# Patient Record
Sex: Male | Born: 1940
Health system: Southern US, Community
[De-identification: ages and names within clinical notes are randomized; demographics above are authoritative.]

## PROBLEM LIST (undated history)

## (undated) DIAGNOSIS — R040 Epistaxis: Secondary | ICD-10-CM

## (undated) DIAGNOSIS — K068 Other specified disorders of gingiva and edentulous alveolar ridge: Secondary | ICD-10-CM

## (undated) DIAGNOSIS — Z973 Presence of spectacles and contact lenses: Secondary | ICD-10-CM

## (undated) DIAGNOSIS — R202 Paresthesia of skin: Secondary | ICD-10-CM

## (undated) DIAGNOSIS — C679 Malignant neoplasm of bladder, unspecified: Secondary | ICD-10-CM

## (undated) DIAGNOSIS — R200 Anesthesia of skin: Secondary | ICD-10-CM

## (undated) DIAGNOSIS — M545 Low back pain: Secondary | ICD-10-CM

## (undated) DIAGNOSIS — M7701 Medial epicondylitis, right elbow: Secondary | ICD-10-CM

## (undated) DIAGNOSIS — L600 Ingrowing nail: Secondary | ICD-10-CM

## (undated) DIAGNOSIS — B351 Tinea unguium: Secondary | ICD-10-CM

## (undated) DIAGNOSIS — H9319 Tinnitus, unspecified ear: Secondary | ICD-10-CM

## (undated) DIAGNOSIS — E785 Hyperlipidemia, unspecified: Secondary | ICD-10-CM

## (undated) HISTORY — PX: KNEE ARTHROSCOPY W/ MENISCAL REPAIR: SHX1877

## (undated) HISTORY — PX: FINGER AMPUTATION: SHX636

## (undated) HISTORY — DX: Epistaxis: R04.0

## (undated) HISTORY — DX: Other specified disorders of gingiva and edentulous alveolar ridge: K06.8

## (undated) HISTORY — DX: Medial epicondylitis, right elbow: M77.01

## (undated) HISTORY — DX: Low back pain: M54.5

## (undated) HISTORY — DX: Ingrowing nail: L60.0

## (undated) HISTORY — PX: SKIN GRAFT: SHX250

## (undated) HISTORY — DX: Tinnitus, unspecified ear: H93.19

---
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
## 1961-12-31 HISTORY — PX: SMALL INTESTINE SURGERY: SHX150

## 1996-12-31 HISTORY — PX: OTHER SURGICAL HISTORY: SHX169

## 1999-08-04 ENCOUNTER — Other Ambulatory Visit: Admission: RE | Admit: 1999-08-04 | Discharge: 1999-08-04 | Payer: Self-pay

## 2002-06-18 ENCOUNTER — Ambulatory Visit (HOSPITAL_COMMUNITY): Admission: RE | Admit: 2002-06-18 | Discharge: 2002-06-18 | Payer: Self-pay | Admitting: Family Medicine

## 2002-07-23 ENCOUNTER — Ambulatory Visit (HOSPITAL_COMMUNITY): Admission: RE | Admit: 2002-07-23 | Discharge: 2002-07-23 | Payer: Self-pay | Admitting: Family Medicine

## 2002-07-23 ENCOUNTER — Encounter: Payer: Self-pay | Admitting: Family Medicine

## 2003-04-28 ENCOUNTER — Ambulatory Visit (HOSPITAL_COMMUNITY): Admission: RE | Admit: 2003-04-28 | Discharge: 2003-04-28 | Payer: Self-pay | Admitting: Family Medicine

## 2003-04-28 ENCOUNTER — Encounter: Payer: Self-pay | Admitting: Family Medicine

## 2003-11-16 ENCOUNTER — Encounter: Admission: RE | Admit: 2003-11-16 | Discharge: 2003-11-16 | Payer: Self-pay | Admitting: Family Medicine

## 2003-11-30 ENCOUNTER — Encounter: Admission: RE | Admit: 2003-11-30 | Discharge: 2003-11-30 | Payer: Self-pay | Admitting: Family Medicine

## 2004-02-09 ENCOUNTER — Encounter: Admission: RE | Admit: 2004-02-09 | Discharge: 2004-02-09 | Payer: Self-pay | Admitting: Family Medicine

## 2004-02-21 ENCOUNTER — Encounter: Admission: RE | Admit: 2004-02-21 | Discharge: 2004-02-21 | Payer: Self-pay | Admitting: Family Medicine

## 2006-12-31 HISTORY — PX: KNEE ARTHROSCOPY W/ MENISCAL REPAIR: SHX1877

## 2007-02-27 DIAGNOSIS — M48061 Spinal stenosis, lumbar region without neurogenic claudication: Secondary | ICD-10-CM | POA: Insufficient documentation

## 2007-02-27 DIAGNOSIS — N400 Enlarged prostate without lower urinary tract symptoms: Secondary | ICD-10-CM | POA: Insufficient documentation

## 2007-04-27 ENCOUNTER — Encounter: Payer: Self-pay | Admitting: Family Medicine

## 2007-04-29 ENCOUNTER — Ambulatory Visit: Payer: Self-pay | Admitting: Family Medicine

## 2007-04-29 ENCOUNTER — Ambulatory Visit (HOSPITAL_COMMUNITY): Admission: RE | Admit: 2007-04-29 | Discharge: 2007-04-29 | Payer: Self-pay | Admitting: Family Medicine

## 2007-04-29 DIAGNOSIS — E785 Hyperlipidemia, unspecified: Secondary | ICD-10-CM | POA: Insufficient documentation

## 2007-04-30 LAB — CONVERTED CEMR LAB
BUN: 13 mg/dL (ref 6–23)
CO2: 26 meq/L (ref 19–32)
Calcium: 9 mg/dL (ref 8.4–10.5)
Chloride: 103 meq/L (ref 96–112)
Cholesterol: 180 mg/dL (ref 0–200)
Creatinine, Ser: 0.74 mg/dL (ref 0.40–1.50)
HDL: 50 mg/dL (ref >39)
Total CHOL/HDL Ratio: 3.6

## 2007-08-27 ENCOUNTER — Encounter: Admission: RE | Admit: 2007-08-27 | Discharge: 2007-08-27 | Payer: Self-pay | Admitting: Orthopedic Surgery

## 2007-09-01 HISTORY — PX: LUMBAR DISC SURGERY: SHX700

## 2007-09-09 ENCOUNTER — Encounter: Payer: Self-pay | Admitting: Family Medicine

## 2007-09-26 ENCOUNTER — Inpatient Hospital Stay (HOSPITAL_COMMUNITY): Admission: RE | Admit: 2007-09-26 | Discharge: 2007-09-29 | Payer: Self-pay | Admitting: Orthopedic Surgery

## 2010-05-16 ENCOUNTER — Ambulatory Visit: Payer: Self-pay | Admitting: Family Medicine

## 2010-05-16 DIAGNOSIS — M7750 Other enthesopathy of unspecified foot: Secondary | ICD-10-CM | POA: Insufficient documentation

## 2010-05-16 DIAGNOSIS — M25569 Pain in unspecified knee: Secondary | ICD-10-CM | POA: Insufficient documentation

## 2010-05-17 ENCOUNTER — Telehealth: Payer: Self-pay | Admitting: *Deleted

## 2010-07-17 ENCOUNTER — Ambulatory Visit: Payer: Self-pay

## 2010-07-17 ENCOUNTER — Encounter: Payer: Self-pay | Admitting: Family Medicine

## 2010-08-01 ENCOUNTER — Encounter
Admission: RE | Admit: 2010-08-01 | Discharge: 2010-08-17 | Payer: Self-pay | Source: Home / Self Care | Admitting: Family Medicine

## 2010-08-21 ENCOUNTER — Ambulatory Visit: Payer: Self-pay | Admitting: Family Medicine

## 2010-08-25 ENCOUNTER — Encounter: Admission: RE | Admit: 2010-08-25 | Discharge: 2010-08-25 | Payer: Self-pay | Admitting: Family Medicine

## 2010-08-25 ENCOUNTER — Encounter (INDEPENDENT_AMBULATORY_CARE_PROVIDER_SITE_OTHER): Payer: Self-pay | Admitting: *Deleted

## 2010-08-25 DIAGNOSIS — M23329 Other meniscus derangements, posterior horn of medial meniscus, unspecified knee: Secondary | ICD-10-CM | POA: Insufficient documentation

## 2010-08-29 ENCOUNTER — Encounter (INDEPENDENT_AMBULATORY_CARE_PROVIDER_SITE_OTHER): Payer: Self-pay | Admitting: *Deleted

## 2010-08-31 ENCOUNTER — Encounter: Payer: Self-pay | Admitting: Family Medicine

## 2010-11-29 ENCOUNTER — Ambulatory Visit: Payer: Self-pay | Admitting: Family Medicine

## 2010-11-29 DIAGNOSIS — J029 Acute pharyngitis, unspecified: Secondary | ICD-10-CM | POA: Insufficient documentation
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
## 2011-02-01 NOTE — Progress Notes (Signed)
Summary: re: immunizations/ts  Phone Note Call from Patient Call back at [phone]   Summary of Call: Pt saw Dr. Sheffield Slider yesterday and says he was to get a tetnus shot and whooping cough shot, but did not should he come back in for them and also will his insurance pay for them? Initial call taken by: Clydell Hakim,  May 17, 2010 9:31 AM  Follow-up for Phone Call        called pt and lmvm to call back. pt has to check with his insurance, if these immunizations are covered. last Pneumovax 04-29-07, Td 1999. does not need Pneumovax, but Td Follow-up by: Arlyss Repress CMA,,  May 18, 2010 12:16 PM  Additional Follow-up for Phone Call Additional follow up Details #1::        informed pt of above. he agreed. Additional Follow-up by: Arlyss Repress CMA,,  May 18, 2010 5:18 PM

## 2011-02-01 NOTE — Letter (Signed)
Summary: [facility] PT Referral Form  [facility] PT Referral Form   Imported By: Marily Memos 07/18/2010 09:04:50  _____________________________________________________________________  External Attachment:    Type:   Image     Comment:   External Document

## 2011-02-01 NOTE — Consult Note (Signed)
Summary: REG. PHYS. ORTHO  REG. PHYS. ORTHO   Imported By: De Nurse 09/12/2010 09:27:13  _____________________________________________________________________  External Attachment:    Type:   Image     Comment:   External Document  Appended Document: Arthroscopy planned arthroscopy planned for left knee medial meniscal tear.    Clinical Lists Changes  Problems: Removed problem of KNEE SPRAIN, LEFT, MEDIAL COLLATERAL LIGAMENT (ICD-844.1)

## 2011-02-01 NOTE — Miscellaneous (Signed)
Summary: NEW ORTH APPT DUE TO [employer]  DR AVIOLI THURS SEPT 1ST AT 8:45AM [address] [place], [place] [id number]  BRING MRI DISC AT THE TIME OF YOUR APPT
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
## 2011-02-01 NOTE — Miscellaneous (Signed)
Summary: ORTHO APPT  APPT WITH DR GRAVES TUES AUG 30TH AT 11:30AM [facility] [phone] PT INFORMED
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
## 2011-02-01 NOTE — Assessment & Plan Note (Signed)
Summary: f/u,mc   Vital Signs:  Patient profile:   70 year old male BP sitting:   133 / 81  Vitals Entered By: Lillia Pauls CMA (August 21, 2010 2:19 PM)  Primary Care Provider:  Zachery Dauer MD   History of Present Illness: f/u Left knee pain has been to PT 6 times, had iontophoresis 4 times as well as [place]. HAs not improved at all. Now having pain walking as well as when he tried to run--not running. Very unhappy he is not running and not getting better. No new symptoms --just having them now with walking and biking  as well.  He is maybe having less night time pain. Not awakening him from sleep any more. No new injruy. Pain 3-4/10, medial left knee--aching.No knee swelling, locking or giving way  Current Medications (verified): 1)  Cvs Aspirin 325 Mg  Tabs (Aspirin) .... Take One Tablet Daily 2)  Bee Pollen 1000 Mg Chew (Bee Pollen) .... Take One Tablet Daily 3)  Pycnogenol 30 Mg Caps (Nutritional Supplements) .... Take One Tablet Daily 4)  Cod Liver Oil  Caps (Cod Liver Oil) .... Take One Tablet Daily 5)  Fish Oil 1200 Mg Caps (Omega-3 Fatty Acids) .... Take One Tablet Daily 6)  Ginseng-Siberian 648 Mg Caps (Misc Natural Products) .... Take One Tablet Daily 7)  One-A-Day Extras Antioxidant  Caps (Multiple Vitamins-Minerals) .... Take One Tablet Daily  Allergies: No Known Drug Allergies  Review of Systems       Please see HPI for additional ROS.   Physical Exam  Msk:  Left knee tender along MCL, still slightly lax c/w opposite side but fairly good endpoint. TTP medial superior  border od patella. Neg [place]. Popliteal space feels a little fuller than I remember from last exam but o discrete mass. distally neurovascularly intact   Impression & Recommendations:  Problem # 1:  KNEE SPRAIN, LEFT, MEDIAL COLLATERAL LIGAMENT (ICD-844.1)  Orders: MRI without Contrast (MRI w/o Contrast) has been  very faithful with rehab and has had litle or no improvement--in fact worsening  pain w walking and biking. Also new area of focval tenderness on patella with a slightly full popliteal space. will get MRI and f/u after that  Complete Medication List: 1)  Cvs Aspirin 325 Mg Tabs (Aspirin) .... Take one tablet daily 2)  Bee Pollen 1000 Mg Chew (Bee pollen) .... Take one tablet daily 3)  Pycnogenol 30 Mg Caps (Nutritional supplements) .... Take one tablet daily 4)  Cod Liver Oil Caps (Cod liver oil) .... Take one tablet daily 5)  Fish Oil 1200 Mg Caps (Omega-3 fatty acids) .... Take one tablet daily 6)  Ginseng-siberian 648 Mg Caps (Misc natural products) .... Take one tablet daily 7)  One-a-day Extras Antioxidant Caps (Multiple vitamins-minerals) .... Take one tablet daily  Patient Instructions: 1)  MRI APPT IS ON FRI AUG 26TH AT 12:30PM AT [facility].

## 2011-03-12 ENCOUNTER — Encounter: Payer: Self-pay | Admitting: Home Health Services

## 2011-04-03 ENCOUNTER — Encounter: Payer: Self-pay | Admitting: Home Health Services

## 2011-04-03 ENCOUNTER — Ambulatory Visit (INDEPENDENT_AMBULATORY_CARE_PROVIDER_SITE_OTHER): Payer: Medicare HMO | Admitting: Home Health Services

## 2011-04-03 VITALS — BP 133/83 | HR 64 | Temp 97.7°F | Ht 68.0 in | Wt 177.0 lb

## 2011-04-03 DIAGNOSIS — Z0000 Encounter for general adult medical examination without abnormal findings: Secondary | ICD-10-CM

## 2011-04-03 DIAGNOSIS — Z23 Encounter for immunization: Secondary | ICD-10-CM

## 2011-04-03 NOTE — Patient Instructions (Signed)
1. Try to eat 4-5 vegetables a day. 2. Work on losing 12 pounds by March 2013 to reach weight of 165 lbs. 3. Consider scheduling a colonoscopy. 4. Be sure to wear sun screen daily on arms, legs, and face.  5. Review Advance Directives with your wife.

## 2011-04-03 NOTE — Progress Notes (Signed)
Patient here for annual wellness visit, patient reports: Risk Factors/Conditions needing evaluation or treatment: Patient does not have any risk factors that need evaluation. Home Safety: Patient lives in home with wife and 2 sons.  Patient reports having smoke detectors and does not have adaptive equipment in bathrooms.  Other Information: Corrective lens: Patient wears corrective lens daily.  Sees doctor as needed. Dentures: Patient does not have dentures.  See dentist every 2 years. Memory: Patient denies any memory problems.  Balance max value patientvalue  Sitting balance 1 1  Arise 2 2  Attempts to arise 2 2  Immediate standing balance 2 2  Standing balance 1 1  Nudge 2 2  Eyes closed 1 1  360 degree turn 1 1  Sitting down 2 2   Gait max value patient value  Initiation of gait 1 1  Step length-left 1 1  Step length-right 1 1  Step height-left 1 1  Step height-right 1 1  Step symmetry 1 1  Step continuity 1 1  Path 2 2  Trunk 2 2  Walking stance 1 1   Balance/Gait Score: 26/26    Annual Wellness Visit Requirements Recorded Today In  Medical, family, social history Past Medical, Family, Social History Section  Current providers Care team  Current medications Medications  Wt, BP, Ht, BMI Vital signs  Hearing assessment (welcome visit) Hearing/Vision  Tobacco, alcohol, illicit drug use History  ADL Nurse Assessment  Depression Screening Nurse Assessment  Cognitive impairment/Mini Mental Status Nurse Assessment/ Flowsheet  Fall Risk Nurse Assessment  Home Safety Progress Note  End of Life Planning (welcome visit) Social Documentation  Medicare preventative services Progress Note  Risk factors/conditions needing evaluation/treatment Progress Note  Personalized health advice Patient Instructions, goals, letter  Diet & Exercise Social Documentation  Emergency Contact Social Documentation  Seat Belts Social Documentation  Sun exposure/protection Social  Documentation    Medicare Prevention Plan: Recommended patient schedule a colonoscopy and consider an aortic ultrasound due to past history of heart disease in family and hx of smoking. Patient requested TD booster vaccine.   Recommended Medicare Prevention Screenings Men over 50 Test For Frequency Date of Last- BOLD if needed  Colorectal Cancer 1-10 yrs recommended  Prostate Cancer Never or yearly 4/08  Aortic Aneurysm Once if 65-75 with hx of smoking recommended  Cholesterol 5 yrs 4/08  Diabetes yearly Non diabetic  HIV yearly declined  Influenza Shot yearly declined  Pneumonia Shot once 4/08  Zostavax Shot once 4/08

## 2011-04-12 NOTE — Progress Notes (Signed)
I have reviewed this visit and discussed with Suzanne Lineberry and agree with her documentation  

## 2011-05-15 NOTE — Op Note (Signed)
NAME:  William [name], William [name] NO.:  [id number]   MEDICAL RECORD NO.:  [id number]          PATIENT TYPE:  INP   LOCATION:  1603                         FACILITY:  [facility]   PHYSICIAN:  William Lynch. Gioffre, M.D.DATE OF BIRTH:  24-Feb-1941   DATE OF PROCEDURE:  09/26/2007  DATE OF DISCHARGE:                               OPERATIVE REPORT   SURGEON:  William Lynch. Darrelyn [name], M.D.   ASSISTANT:  Jene Every, M.D.   PREOPERATIVE DIAGNOSIS:  1. Severe constricted complete block at L3-L4.  2. Severe constricted complete block at L4-L5.  3. Partial foot drop on the right preoperatively.   POSTOPERATIVE DIAGNOSIS:  1. Severe constricted complete block at L3-L4.  2. Severe constricted complete block at L4-L5.  3. Partial foot drop on the right preoperatively.   OPERATION:  1. Central decompressive lumbar laminectomy at L3-L4.  2. Central decompressive lumbar laminectomy at L4-L5.  3. Repair of an approximately a 1 inch dural leak at L4-L5.  4. Bilateral foraminotomies at L3-L4 and at L4-K5.   NOTE:  Under general anesthesia, the patient spinal on the table, first  a Foley catheter was inserted, he was placed on the spinal frame.  A  sterile prep and drape of his back was carried out.  Two needles were  placed in the back for localization purposes and an x-ray was taken.  We  then made an incision over the L3-4 and L4-L5 spaces.  Bleeders were  identified and cauterized.  We stripped the muscle from the underlying  lamina and spinous processes and another x-ray was taken to verify our  position.  We identified the L3 and the L4 spinous processes.  We then  did a central decompressive lumbar laminectomy.  We did utilize the  microscope.  Note, we first went in at the L3-L4 space and, at this  time, we did our central decompression and removed the ligamentum  flavum.  Note, the ligamentum flavum was literally plastered down to the  dura.  He had a complete block at this level.  We  gently were able to  remove that with use of the microscope.   We then proceeded distally where the constriction was even more severe.  He had a complete block at this level. The ligamentum flavum was  thickened and adherent to the dura.  By use of the microscope, we gently  peeled the ligamentum flavum off of the dura and we, at this time,  incurred a dural leak.  It was approximately 1 inch and was a  longitudinal type leak.  We covered the area with a cottonoid and  proceeded to complete our decompression distally and laterally.  The  lateral recess was severely compromised on the right affected side.  We  did a foraminotomy and nicely cleaned out the lateral recess.  When our  decompression was complete, we thoroughly irrigated out the area.  We  gently removed the cottonoid, identified the ends of the dura, and noted  that the rootlets were in good position.  We protected the rootlets and  sutured the dura from side-to-side.  After that was done, we then asked  the anesthesiologist to carry out a Valsalva maneuver and there was no  leakage noted.  Following that, we then put a Duragen patch which is a  dural graft matrix over the dura with some Tisseel followed by just  loosely applying some thrombin soaked Gelfoam.   We then closed the wound in layers in the usual fashion.  We gave him a  second gram of IV Ancef during the procedure, he had 1 gram pre-  procedure.  I left the deep proximal part of the wound open for drainage  purposes.  I did not want to insert a drain for fear of carrying down  any infection on the open dura.  The skin was closed with metal staples.  A sterile Neosporin dressing was applied.  The patient left the  operating room in satisfactory condition.           ______________________________  William [name], M.D.     RAG/MEDQ  D:  09/26/2007  T:  09/27/2007  Job:  [id number]

## 2011-05-15 NOTE — H&P (Signed)
NAME:  William [name], William [name] NO.:  [id number]   MEDICAL RECORD NO.:  [id number]         PATIENT TYPE:  LINP   LOCATION:                               FACILITY:  [facility]   PHYSICIAN:  Georges Lynch. Gioffre, M.D.DATE OF BIRTH:  17-Jun-1941   DATE OF ADMISSION:  09/26/2007  DATE OF DISCHARGE:                              HISTORY & PHYSICAL   CHIEF COMPLAINT:  Lower back and bilateral leg pain.   HISTORY OF PRESENT ILLNESS:  William [name] is a 70 year old gentleman with  some lower back and right leg pain.  He is a very avid runner, does a  lot of running, maybe 50-60 miles a week.  He has been having this onset  of this problem now for about 3-4 months.  He has been worked up with  MRIs and myelogram.  His myelogram shows he has some severe spinal  stenosis at L3-4, L4-5 so the patient and Dr. Darrelyn Hillock reviewed the  findings and the patient would like to proceed with a central  decompressive lumbar laminectomy at L3-4, L4-5 for his complaints.   ALLERGIES:  No known drug allergies.   MEDICATIONS:  Multivitamins, aspirin, ginseng, garlic, omega-3, B  complex.  The patient reports to have stopped all above medicines  preoperatively.   PRESENT MEDICAL HISTORY:  1. The patient reports having blackout spells probably 3-4 years ago      that has been evaluated by a cardiologist, it was related to his      running.  He would get up to his running speed then he would stop      abruptly and he would occasionally have a blackout period for a      short period of time.  The cardiologist felt it was related to the      abrupt stopping and he has adjusted his running activities and he      has not had this problem repeat itself.  2. History of ulcers 30 years previously, no recurrence.   REVIEW OF SYSTEMS:  The patient denies review of systems of any  neurologic, pulmonary, cardiovascular, GI, GU, hematologic problems.   PRIMARY CARE PHYSICIAN:  Wayne A. Sheffield Slider, M.D.   PAST SURGICAL  HISTORY:  1. Abdominal adhesions in 1959.  2. Hemorrhoidectomy in 1996.  3. Bone spurs right heel in 1999.  4. The patient denies any complications with any above mentioned      surgical procedures.   FAMILY HISTORY:  Father is deceased from a history of complications of a  stroke with history of diabetes at the age of 50.  Mother is deceased at  old age with bad arthritis.   SOCIAL HISTORY:  The patient is married.  He is currently retired.  He  does not smoke or use alcohol.  He has 3 grown children and lives with  his wife in a one story house.   PHYSICAL EXAMINATION:  GENERAL:  The patient is a very healthy  appearing, well-developed, conscious and alert and appropriate 66-year-  old gentleman, appears to be in no distress.  VITAL SIGNS:  Height  is 5 foot 7 inches, weight is 174, blood pressure  is 122/78, pulse is 50, the patient reports normal bradycardic resting  rate.  HEENT:  Head is normocephalic.  He has a very large skin graft across  the anterior part of his left forehead.  Extraocular movements intact.  Gross hearing is intact.  Oral buccal mucosa is pink and moist.  NECK:  Good range of motion without any discomfort.  No palpable  lymphadenopathy.  CHEST:  Lung sounds were clear and equal bilaterally, no wheezes, rales  or rhonchi.  HEART:  Regular rate and rhythm, bradycardic, no murmurs, rubs or  gallops.  ABDOMEN:  Soft, nontender, bowel sounds present.  EXTREMITIES:  Upper extremities he had symmetrically shape, good range  of motion of his shoulders, elbows, and wrist.  Lower extremities had  excellent range of motion without any difficulty.  NEURO:  The patient was conscious, alert, and appropriate.  He had a  normal light touch sensation neuro exam of his lower extremities.  Motor  strength was 5/5.  He had good range of motion of his back today without  any radicular complaints.  PERIPHERAL VASCULAR:  Carotid pulses were 2+, radial pulses 2+, dorsalis   pedis pulses, he had no lower extremity pigmentation changes or edema.  BREAST/RECTAL/GU:  Deferred at this time.   IMPRESSION:  1. Severe spinal stenosis at L3-4, L4-5 with intermittent right leg      pain.  2. History of previous blackout spells.  3. History of ulcers.   PLAN:  The patient will undergo all routine labs and tests prior to  having a central decompressive lumbar laminectomy at L3-4, L4-5 by Dr.  Darrelyn Hillock at [facility] on September 26, 2007.  The patient's  primary care physician has indicated that there are no preoperative  contraindications for this upcoming surgical procedure.      Jamelle Rushing, P.A.    ______________________________  Georges Lynch Darrelyn Hillock, M.D.    RWK/MEDQ  D:  09/15/2007  T:  09/15/2007  Job:  [id number]

## 2011-05-18 NOTE — Discharge Summary (Signed)
NAME:  William [name], William [name] NO.:  [id number]   MEDICAL RECORD NO.:  [id number]          PATIENT TYPE:  INP   LOCATION:  1603                         FACILITY:  [facility]   PHYSICIAN:  Georges Lynch. Gioffre, M.D.DATE OF BIRTH:  10/22/1941   DATE OF ADMISSION:  09/26/2007  DATE OF DISCHARGE:  09/29/2007                               DISCHARGE SUMMARY   ADMISSION DIAGNOSES:  1. Severe spinal stenosis L3-4 and L4-5 with intermittent right leg      pain.  2. History of previous blackout spells.  3. History of ulcers.   DISCHARGE DIAGNOSIS:  1. Central decompression at L3-4 and L4-5 with intraoperative dural      leak.  2. History of previous blackouts.  3. History of ulcers.   HISTORY OF PRESENT ILLNESS:  The patient is a 70 year old gentleman with  some lower back pain and right leg pain.  He is an avid runner and runs  50-60 miles a week.  He has had about 3-4 months of lower back pain and  right leg pain.  A CT myelogram shows that he has severe spinal stenosis  at L3-4, L4-5.  The patient has elected to proceed with a central  decompressive lumbar laminectomy at L3-4, L4-5 due to his complaints.   ALLERGIES:  No known drug allergies.   CURRENT MEDICATIONS:  Notable for over-the-counter food supplements and  multivitamins.   SURGICAL PROCEDURE:  On September 26, 2007, the patient was taken to the  OR by Dr. Worthy Rancher assisted by Dr. Jene Every.  Under general  anesthesia the patient underwent decompressive lumbar laminectomy of L3-  4, L4-5.  Intraoperatively there was a small dural leak.  This was  treated with a patch that had a good seal.  The leak was in the L4-5  region.  Estimated blood loss was 300 mL.  The patient was transferred  to the recovery room and then to the orthopedic floor in good condition.   CONSULTS:  The following routine consults were requested:  Physical  therapy and case management.   HOSPITAL COURSE:  On September 26, 2007, the patient  was admitted to was  [facility] under the care of Dr. Worthy Rancher.  The patient was  taken to the OR where it a central lumbar decompressive lumbar  laminectomy was performed at L3-4, L4-5.  Intraoperatively there was a  small dural leak at the L4-5 level.  The patient had this repaired  intraoperatively with a patch.  There was no problems with postpatching  leaking.  The patient was transferred to the recovery room and then to  orthopedic floor in good condition.   The patient then was kept at bedrest for a 24-hour period for his  intraoperative dural leak.  The first postop day the patient was  neurologically intact in lower extremities with motion, sensory, and  motor strength.  He had no signs of postoperative dural leak at that  time.   Postop day #2 the patient was able to get up, and mobilize out of bed,  He did well.  He had  no problems with his wound, and was maintained  neurologically intact in the lower extremities.  Postop day #2 the  patient was evaluated by Dr. Darrelyn Hillock and was doing very well.  He had no  problems with no signs of any resulting dural leak.  He was  neurologically intact in both legs with no pain.  The patient was  discharged home for routine outpatient care.   LABS:  CBC on admission found WBC to be 5.3, hemoglobin 15.1, hematocrit  44.1, platelets 229.  Postoperatively his H&H was 11.7 and 34.0.  Routine chemistries on admission were normal.  Routine urinalysis on  admission was normal.  EKG on admission was marked sinus bradycardia at  39 beats per minute which was normal for this patient.  Chest x-ray  showed no acute cardiopulmonary disease.   DISCHARGE INSTRUCTIONS:  1. Activity--the patient is to slowly increase activity as tolerated      without any bending, stooping, squatting, or lifting.  2. Diet--no restrictions.   MEDICATIONS:  1. Aspirin 325 mg a day.  2. Geritol multivitamins 1/2 teaspoon a day.  3. One-a-day Centrum 1  tablet a day.  4. Red Chinese ginseng 1 tablet twice a day.  5. Regular ginseng complex 1 tablet twice a day.  6. Horse chestnut 1 tablet a day.  7. Ginkgo biloba 1 tablet a day.  8. Ginger root 1 tablet a day.  9. Super energy up B12 and ginger 1 tablet a day.  10.Omega-3 one tablet a day.  11.Garlic 1 tablet a day.  12.__________ 1 tablet a day.  13.Robaxin 500 mg 1 tablet q.6 h. for pain if needed.  14.Percocet 10/650 one tablet q.4-6 h. for pain if needed.  15.Keflex 500 mg one tablet 4x a day until gone.   FOLLOWUP:  The patient is to have a follow up appointment with Dr.  Darrelyn Hillock in his office the following Monday for evaluation.  The patient  was to call [phone] for that appointment.   DISPOSITION:  The patient's condition upon discharge to home is listed  as improved and good.      Jamelle Rushing, P.A.    ______________________________  Georges Lynch Darrelyn Hillock, M.D.    RWK/MEDQ  D:  10/15/2007  T:  10/15/2007  Job:  [id number]   cc:   Windy Fast A. Darrelyn Hillock, M.D.  Fax: [phone]

## 2011-09-05 ENCOUNTER — Telehealth: Payer: Self-pay | Admitting: Family Medicine

## 2011-09-05 ENCOUNTER — Ambulatory Visit (INDEPENDENT_AMBULATORY_CARE_PROVIDER_SITE_OTHER): Payer: Medicare HMO

## 2011-09-05 VITALS — BP 138/80 | HR 60 | Temp 97.6°F | Ht 67.5 in | Wt 182.0 lb

## 2011-09-05 DIAGNOSIS — M76892 Other specified enthesopathies of left lower limb, excluding foot: Secondary | ICD-10-CM

## 2011-09-05 MED ORDER — DICLOFENAC EPOLAMINE 1.3 % TD PTCH: 1.0000 | MEDICATED_PATCH | Freq: Two times a day (BID) | TRANSDERMAL | Status: DC

## 2011-09-05 NOTE — Assessment & Plan Note (Signed)
Patient with extra-articular L knee pain, suspect tendinitis as cause.  Short-course systemic NSAIDs, then topical NSAID, heat.  Rest, then reduction of resistance for leg curls.  To refer for Sports Medicine evaluation if no better with these interventions.

## 2011-09-05 NOTE — Telephone Encounter (Signed)
Called pt and advised to have Rx transferred. Pt agreed. Lorenda Hatchet, Renato Battles

## 2011-09-05 NOTE — Progress Notes (Signed)
  Subjective:    Patient ID: William [name], male    DOB: 08-23-1941, 70 y.o.   MRN: [id number]  HPI Patient here for acute visit, complaint of L posterior knee pain which had onset May 25, 2011 while working out on [employer].  Has had constant pain that is worse with flexion L knee.  No swelling or pop/sudden weakness.  Prior L meniscal surgery Sept 19, 2011 with Dr Dimas Aguas, worked postop with PT and began walking/running again (longtime runner, previously 50-60 miles/week; down to 20-30 miles/week after meniscal injury/surgery).  In May he began to increase the amount of weight in leg curls (from 30lb/leg to 70lb/leg); worsening pain.  Took Advil 400mg  a few times without improvement. Ice did not help either.  Pain at night when lays on L side.    Former smoker, quit 30 years ago.  Used to be occasional beer drinker, none in several years.    Review of Systems No fevers/chills, no trauma, no swelling in legs.  No changes in sensation distal legs/feet.     Objective:   Physical Exam Well appearing, no apparent distress.  HEENT Neck supple.  MSK: Full active and passive ROM both knees; ankles; hips.  No skin lesions or rubor/calor of either knee, no effusions bilat.  No cords or popliteal masses noted bilaterally. Pain to palpate along L posterior-medial tendon, worse with flexion (passive and active).  Dorsalis pedis pulses full and symmetric bilaterally.  No edema noted in ankles bilaterally.        Assessment & Plan:

## 2011-09-05 NOTE — Telephone Encounter (Signed)
Mr. Candee was in earlier and a patch of Flector was to be sent to [facility].  It appears that it has gone, but he is concerned that it may have gone to the wrong [facility] as there are 2 on [address].  He said the correct [facility] is the one at [facility] - phone # [phone].

## 2011-09-05 NOTE — Patient Instructions (Signed)
It was a pleasure to see you today.  The pain behind your left knee is related to tendinitis (muscular insertion of the hamstring muscles behind your knee).   We believe this is related to the increase in resistance that you are using with the knee curls.    I recommend reducing the amount of resistance (say, from 70 lbs back to 30 lbs) after taking a couple of days off.  You may use heating pads ad lib for the pain.   I recommend a short course of ibuprofen (Advil) from 400 to 600mg  by mouth every 6 to 8 hours with food, for the next 5 days.  Also, I am prescribing Flector patch, apply one patch twice daily after you stop the oral ibuprofen.  Gradual increase in the amount of resistance you are using; do not rush to increase the weight for the knee curls.

## 2011-10-03 ENCOUNTER — Ambulatory Visit (INDEPENDENT_AMBULATORY_CARE_PROVIDER_SITE_OTHER): Payer: Medicare HMO | Admitting: Family Medicine

## 2011-10-03 ENCOUNTER — Encounter: Payer: Self-pay | Admitting: Family Medicine

## 2011-10-03 ENCOUNTER — Telehealth: Payer: Self-pay | Admitting: Family Medicine

## 2011-10-03 VITALS — BP 132/81 | HR 63 | Temp 98.7°F | Ht 67.5 in | Wt 184.5 lb

## 2011-10-03 DIAGNOSIS — M23329 Other meniscus derangements, posterior horn of medial meniscus, unspecified knee: Secondary | ICD-10-CM

## 2011-10-03 DIAGNOSIS — Z23 Encounter for immunization: Secondary | ICD-10-CM

## 2011-10-03 NOTE — Progress Notes (Signed)
  Subjective:    Patient ID: William [name], male    DOB: 02/03/1941, 70 y.o.   MRN: [id number]  HPI  Left knee pain. Had arthroscopy in September or October 2011. Had decrease in pain and did well until recently. Has tried to get back to running 15 m a week and is having psoterior left knee pain constantly, worse with activity esp running which is a passion for him. Some stiffness..pain in the top of the medial posterior left calf associated with it. No locking. Does not take anything other than tylenol for pain and does not wants any pain meds.  Review of Systems Pertinent review of systems: negative for fever or unusual weight change.     Objective:   Physical Exam  GEN WD WN NAD. KNEE: left: mild stiffness with extension. Flex and extension full. TTP posterior medial joint line. Popliteal space is benign. Calf is soft. Distally neurovascularly intact.      Assessment & Plan:  Knee pain s/p arthroscopy for medial meniscal tear. I think he would be best served by a return to see Dr Dimas Aguas who did his 'scope. He does not want any pain meds. He wants to keep running some.

## 2011-10-03 NOTE — Telephone Encounter (Signed)
Called Dr. Elta Guadeloupe office and they will fax over a referral sheet for him.Laureen Ochs, Viann Shove

## 2011-10-03 NOTE — Telephone Encounter (Signed)
Dear [facility] Team Please set up his ortho appt w Dr Lawana Chambers should be in [facility]! Denny Levy

## 2011-10-11 LAB — URINALYSIS, ROUTINE W REFLEX MICROSCOPIC
Glucose, UA: NEGATIVE
Nitrite: NEGATIVE
Protein, ur: NEGATIVE

## 2011-10-11 LAB — COMPREHENSIVE METABOLIC PANEL
Albumin: 3.9
BUN: 8
Creatinine, Ser: 0.76
Total Bilirubin: 0.8
Total Protein: 7.1

## 2011-10-11 LAB — DIFFERENTIAL
Basophils Absolute: 0
Lymphocytes Relative: 29
Monocytes Absolute: 0.5
Monocytes Relative: 9
Neutro Abs: 3.2

## 2011-10-11 LAB — TYPE AND SCREEN

## 2011-10-11 LAB — CBC
HCT: 44.1
MCHC: 34.1
MCV: 90
Platelets: 229
RDW: 12.3

## 2011-10-11 LAB — ABO/RH: ABO/RH(D): O POS

## 2011-10-29 ENCOUNTER — Telehealth: Payer: Self-pay | Admitting: *Deleted

## 2011-10-29 NOTE — Telephone Encounter (Signed)
Called patient and instructed him to come in for EKG for surgical clearance tomorrow on the nurse list per Dr Sheffield Slider. He agreed to come in at 9:00.Busick, Rodena Medin

## 2011-10-30 ENCOUNTER — Ambulatory Visit (INDEPENDENT_AMBULATORY_CARE_PROVIDER_SITE_OTHER): Payer: Medicare HMO | Admitting: *Deleted

## 2011-10-30 ENCOUNTER — Ambulatory Visit (HOSPITAL_COMMUNITY)
Admission: RE | Admit: 2011-10-30 | Discharge: 2011-10-30 | Disposition: A | Discharge status: Home / Self Care | Payer: Medicare HMO | Source: Ambulatory Visit | Attending: Family Medicine | Admitting: Family Medicine

## 2011-10-30 ENCOUNTER — Other Ambulatory Visit: Payer: Self-pay

## 2011-10-30 DIAGNOSIS — IMO0001 Reserved for inherently not codable concepts without codable children: Secondary | ICD-10-CM

## 2011-10-30 DIAGNOSIS — Z789 Other specified health status: Secondary | ICD-10-CM

## 2011-10-30 DIAGNOSIS — Z01810 Encounter for preprocedural cardiovascular examination: Secondary | ICD-10-CM | POA: Insufficient documentation

## 2011-10-30 DIAGNOSIS — M23329 Other meniscus derangements, posterior horn of medial meniscus, unspecified knee: Secondary | ICD-10-CM

## 2011-10-30 NOTE — Progress Notes (Signed)
In for EKG for surgical clearance. EKG performed and placed in MD box.

## 2011-10-30 NOTE — Assessment & Plan Note (Signed)
Medical clearance for L knee arthroplasty with debridement and possible medial and lateral menisectomy  EKG was normal so medical clearance OK'd on form to be faxed back.

## 2011-11-01 HISTORY — PX: KNEE ARTHROSCOPY W/ MENISCAL REPAIR: SHX1877

## 2012-04-08 ENCOUNTER — Encounter: Payer: Self-pay | Admitting: Home Health Services

## 2012-04-10 ENCOUNTER — Encounter: Payer: Self-pay | Admitting: Family Medicine

## 2012-04-10 ENCOUNTER — Ambulatory Visit (HOSPITAL_COMMUNITY)
Admission: RE | Admit: 2012-04-10 | Discharge: 2012-04-10 | Disposition: A | Discharge status: Home / Self Care | Payer: Medicare HMO | Source: Ambulatory Visit | Attending: Family Medicine | Admitting: Family Medicine

## 2012-04-10 ENCOUNTER — Ambulatory Visit (INDEPENDENT_AMBULATORY_CARE_PROVIDER_SITE_OTHER): Payer: Medicare HMO | Admitting: Family Medicine

## 2012-04-10 ENCOUNTER — Ambulatory Visit
Admission: RE | Admit: 2012-04-10 | Discharge: 2012-04-10 | Disposition: A | Discharge status: Home / Self Care | Payer: Medicare HMO | Source: Ambulatory Visit | Attending: Family Medicine | Admitting: Family Medicine

## 2012-04-10 VITALS — BP 126/80 | HR 60 | Ht 67.5 in | Wt 183.0 lb

## 2012-04-10 DIAGNOSIS — J449 Chronic obstructive pulmonary disease, unspecified: Secondary | ICD-10-CM | POA: Insufficient documentation

## 2012-04-10 DIAGNOSIS — R0602 Shortness of breath: Secondary | ICD-10-CM

## 2012-04-10 DIAGNOSIS — E785 Hyperlipidemia, unspecified: Secondary | ICD-10-CM

## 2012-04-10 DIAGNOSIS — M25569 Pain in unspecified knee: Secondary | ICD-10-CM

## 2012-04-10 DIAGNOSIS — M542 Cervicalgia: Secondary | ICD-10-CM | POA: Insufficient documentation

## 2012-04-10 LAB — CBC
HCT: 41.6 % (ref 39.0–52.0)
Hemoglobin: 13.4 g/dL (ref 13.0–17.0)
MCH: 30 pg (ref 26.0–34.0)
MCV: 93.3 fL (ref 78.0–100.0)
Platelets: 257 10*3/uL (ref 150–400)
RBC: 4.46 MIL/uL (ref 4.22–5.81)

## 2012-04-10 LAB — COMPREHENSIVE METABOLIC PANEL
BUN: 13 mg/dL (ref 6–23)
CO2: 25 mEq/L (ref 19–32)
Calcium: 9.3 mg/dL (ref 8.4–10.5)
Creat: 0.74 mg/dL (ref 0.50–1.35)
Glucose, Bld: 94 mg/dL (ref 70–99)
Total Bilirubin: 0.6 mg/dL (ref 0.3–1.2)

## 2012-04-10 LAB — LDL CHOLESTEROL, DIRECT: Direct LDL: 137 mg/dL — ABNORMAL HIGH

## 2012-04-10 NOTE — Assessment & Plan Note (Signed)
rule out cervical radiculopathy due to DDD or DJD

## 2012-04-10 NOTE — Progress Notes (Signed)
  Subjective:    Patient ID: William [name], male    DOB: [birth year], 71 y.o.   MRN: [id number]  HPIHe had a second left knee arthroscopic surgery Nov of 2012 and has been exercising regularly in the hope of returning to running. He currently can only run about 4 minutes due to knee discomfort. He's been doing the elliptical at a slow speed for up to an hour or the stationary bicycle for 45 minutes. However, he's concerned because he has dyspnea at the top of a flight of stairs and he doesn't seem to be regaining his conditioning. When using the exercise equipment he had chronically developed numbness in his fingers relieved by shaking. These symptoms don't occur at night. Recently he's had a sore tightness at times in his left neck. He intermittently has a numb vibratory feeling in his left lateral anterior thigh and at other times in the left pectoral area, neither associated with a particular activity or position.   He has nocturia x 1-2 depending on intake and also notes dribbling and urgency when his bladder is full, but no burning.   He did stool hemacults through [facility] about 6 months ago that were negative.    Review of Systems  Constitutional: Positive for fatigue. Negative for fever, chills, diaphoresis, activity change, appetite change and unexpected weight change.  HENT: Positive for neck pain and neck stiffness.   Eyes: Negative for visual disturbance.  Respiratory: Positive for cough and shortness of breath. Negative for chest tightness and wheezing.   Gastrointestinal: Negative for nausea, diarrhea, constipation and blood in stool.  Genitourinary: Positive for urgency. Negative for difficulty urinating.  Musculoskeletal: Positive for back pain and arthralgias.       Typical back pain after certain activity  Neurological: Negative for dizziness, tremors, syncope, numbness and headaches.  Hematological: Negative for adenopathy.  Psychiatric/Behavioral: Negative for dysphoric mood.        Objective:   Physical Exam  Constitutional: He is oriented to person, place, and time. He appears well-developed and well-nourished.  Neck: Normal range of motion.  Cardiovascular: Regular rhythm.   No murmur heard. Pulmonary/Chest: Effort normal and breath sounds normal. No respiratory distress. He has no wheezes. He has no rales.  Abdominal: Soft. Bowel sounds are normal. He exhibits no distension and no mass. There is no tenderness.       right sided vertical old surgical scar  Musculoskeletal: Normal range of motion. He exhibits no edema and no tenderness.       Arthroscopy scars left knee Full range of motion  No effusion Spurlings test neg  Neurological: He is alert and oriented to person, place, and time. He has normal reflexes. No cranial nerve deficit.       No current decrease in skin sensation          Assessment & Plan:

## 2012-04-10 NOTE — Patient Instructions (Addendum)
I will call with your results when all complete. Return if you fail to gradually improve.  Meralgia Paresthetica   Meralgia paresthetica (MP) is a disorder characterized by tingling, numbness, and burning pain in the outer side of the thigh. It occurs in men more than women. MP is generally found in middle-aged or overweight people. Sometimes, the disorder may disappear. CAUSES The disorder is caused by a nerve in the thigh being squeezed (compressed). MP may be associated with tight clothing, pregnancy, diabetes, and being overweight (obese). SYMPTOMS  Tingling, numbness, and burning in the outer thigh.   An area of the skin may be painful and sensitive to the touch.  The symptoms often worsen after walking or standing. TREATMENT  Treatment is based on your symptoms and is mainly supportive. Treatment may include:  Wearing looser clothing.   Losing weight.   Avoiding prolonged standing or walking.   Taking medication.   Surgery if the pain is peristent or severe.  MP usually eases or disappears after treatment. Surgery is not always fully successful. Document Released: 12/07/2002 Document Revised: 12/06/2011 Document Reviewed: 12/17/2005 [facility] Patient Information 2012 [place], [place].

## 2012-04-10 NOTE — Assessment & Plan Note (Signed)
May be due to deconditioning relative to his former high level rule out coronary insufficiency or emphysema due to his remote smoking rule out anemia or organ dysfunction

## 2012-04-11 ENCOUNTER — Encounter: Payer: Self-pay | Admitting: Family Medicine

## 2012-04-11 NOTE — Progress Notes (Signed)
Addended by: Zachery Dauer on: 04/11/2012 11:48 AM   Modules accepted: Orders

## 2012-04-15 ENCOUNTER — Telehealth: Payer: Self-pay | Admitting: *Deleted

## 2012-04-15 NOTE — Telephone Encounter (Signed)
Called and informed patient of ETT at [facility] on 05/09/12 at 11:15. Patient was given blue information sheet at his office visit. I reminded him to arrive 45 minutes early at admitting office.Baby Stairs, Rodena Medin

## 2012-04-22 ENCOUNTER — Encounter: Payer: Self-pay | Admitting: Family Medicine

## 2012-04-22 ENCOUNTER — Ambulatory Visit (INDEPENDENT_AMBULATORY_CARE_PROVIDER_SITE_OTHER): Payer: Medicare HMO | Admitting: Family Medicine

## 2012-04-22 VITALS — BP 125/83 | HR 61 | Ht 67.5 in | Wt 182.0 lb

## 2012-04-22 DIAGNOSIS — M542 Cervicalgia: Secondary | ICD-10-CM

## 2012-04-22 DIAGNOSIS — R0602 Shortness of breath: Secondary | ICD-10-CM

## 2012-04-22 NOTE — Progress Notes (Signed)
  Subjective:    Patient ID: William [name], male    DOB: 03-26-1941, 70 y.o.   MRN: [id number]  HPI He is here for followup of his poor function testing which was done today by Dr. Raymondo Band. He denies wheezing with anything other than extreme running. He's never used an albuterol inhaler.  He denies chest tightness or discomfort with exertion. He does continue to have discomfort in the left side of his neck with rotation and looking down to that side no numbness or weakness in his arms   Review of Systems     Objective:   Physical Exam  Pulmonary/Chest: Effort normal and breath sounds normal.  Musculoskeletal:       Full range of motion of neck. Pain in upper left trapezius with looking down turned to the left          Assessment & Plan:

## 2012-04-22 NOTE — Assessment & Plan Note (Signed)
Cervical spine films if he gets more distinct radicular symptoms

## 2012-04-22 NOTE — Patient Instructions (Addendum)
Your tests indicate emphysema. We will discuss an inhaler after your stress test beginning of May  Cholesterol Cholesterol is a white, waxy, fat-like protein needed by your body in small amounts. The liver makes all the cholesterol you need. It is carried from the liver by the blood through the blood vessels. Deposits (plaque) may build up on blood vessel walls. This makes the arteries narrower and stiffer. Plaque increases the risk for heart attack and stroke. You cannot feel your cholesterol level even if it is very high. The only way to know is by a blood test to check your lipid (fats) levels. Once you know your cholesterol levels, you should keep a record of the test results. Work with your caregiver to to keep your levels in the desired range. WHAT THE RESULTS MEAN:  Total cholesterol is a rough measure of all the cholesterol in your blood.   LDL is the so-called bad cholesterol. This is the type that deposits cholesterol in the walls of the arteries. You want this level to be low.   HDL is the good cholesterol because it cleans the arteries and carries the LDL away. You want this level to be high.   Triglycerides are fat that the body can either burn for energy or store. High levels are closely linked to heart disease.  DESIRED LEVELS:  Total cholesterol below 200.   LDL below 100 for people at risk, below 70 for very high risk.   HDL above 50 is good, above 60 is best.   Triglycerides below 150.  HOW TO LOWER YOUR CHOLESTEROL:  Diet.   Choose fish or white meat chicken and [place], roasted or baked. Limit fatty cuts of red meat, fried foods, and processed meats, such as sausage and lunch meat.   Eat lots of fresh fruits and vegetables. Choose whole grains, beans, pasta, potatoes and cereals.   Use only small amounts of olive, corn or canola oils. Avoid butter, mayonnaise, shortening or palm kernel oils. Avoid foods with trans-fats.   Use skim/nonfat milk and low-fat/nonfat  yogurt and cheeses. Avoid whole milk, cream, ice cream, egg yolks and cheeses. Healthy desserts include angel food cake, ginger snaps, animal crackers, hard candy, popsicles, and low-fat/nonfat frozen yogurt. Avoid pastries, cakes, pies and cookies.   Exercise.   A regular program helps decrease LDL and raises HDL.   Helps with weight control.   Do things that increase your activity level like gardening, walking, or taking the stairs.   Medication.   May be prescribed by your caregiver to help lowering cholesterol and the risk for heart disease.   You may need medicine even if your levels are normal if you have several risk factors.  HOME CARE INSTRUCTIONS   Follow your diet and exercise programs as suggested by your caregiver.   Take medications as directed.   Have blood work done when your caregiver feels it is necessary.  MAKE SURE YOU:   Understand these instructions.   Will watch your condition.   Will get help right away if you are not doing well or get worse.  Document Released: 09/11/2001 Document Revised: 12/06/2011 Document Reviewed: 03/03/2008 [facility] Patient Information 2012 [place], [place].

## 2012-04-22 NOTE — Progress Notes (Signed)
  Subjective:    Patient ID: William [name], male    DOB: 07-18-1941, 71 y.o.   MRN: [id number]  HPI Requested to perform PFTs on patient with distant hx of tobacco use (quit smoking 26 years ago)   Review of Systems     Objective:   Physical Exam See Documentation Flowsheet (discrete results - PFTs) for complete Spirometry results. Patient provided good effort while attempting spirometry.   Lung Age = 89 years         Assessment & Plan:  Spirometry evaluation reveals Moderate obstructive lung disease.  Patient has been experiencing shortness of breath with exercise.  HE does NOT take any inhalation medical threapies currently.   Deferred changes in treatment plan at this time to Dr. Sheffield Slider.  Reviewed results of pulmonary function tests.  Pt verbalized understanding of results.   Total time in face to face counseling 25 minutes.  Patient seen with Tana Conch, PharmD, Pharmacy Resident.

## 2012-04-22 NOTE — Assessment & Plan Note (Signed)
Emphysema from remote smoking may be the only cause. Dr Raymondo Band recommended a trial of Spiriva. ETT test pending to insure no cardiac ischemia.

## 2012-05-02 ENCOUNTER — Ambulatory Visit (HOSPITAL_COMMUNITY): Payer: Medicare HMO

## 2012-05-09 ENCOUNTER — Ambulatory Visit (HOSPITAL_COMMUNITY)
Admission: RE | Admit: 2012-05-09 | Discharge: 2012-05-09 | Disposition: A | Discharge status: Home / Self Care | Payer: Medicare HMO | Source: Ambulatory Visit | Attending: Family Medicine | Admitting: Family Medicine

## 2012-05-09 ENCOUNTER — Ambulatory Visit (INDEPENDENT_AMBULATORY_CARE_PROVIDER_SITE_OTHER): Payer: Medicare HMO | Admitting: Family Medicine

## 2012-05-09 DIAGNOSIS — R0609 Other forms of dyspnea: Secondary | ICD-10-CM | POA: Insufficient documentation

## 2012-05-09 DIAGNOSIS — R0989 Other specified symptoms and signs involving the circulatory and respiratory systems: Secondary | ICD-10-CM | POA: Insufficient documentation

## 2012-05-09 DIAGNOSIS — R0602 Shortness of breath: Secondary | ICD-10-CM

## 2012-05-09 NOTE — Progress Notes (Signed)
The Graded Exercise Test was negative for signs of ischemia. The patient achieved and exercise level of  9.1 mets. Predicted maximal  heart rate  was  150 85% - 90% Maximal  Heart Rate was : 127 Maximal heart rate obtained was: 155.  Pretest Likelihood that this patient has coronary artery disease (CAD), given their symptoms and risk profile was estimated to be  40%. Post test likelihood of this patient having CAD was estimated to be  less than 10%.  TEST SPECIFICS: Maximum heart rate achieved  155 Exercise effort  excellent Symptoms with exercise?  Some mild shortness of breath toward the end of exercise treadmill test but this was not exercise limiting Blood pressure response to exercise?   normal EKG response to exercise: No signs of ischemia  COMMENTS:  Negative exercise treadmill test for signs of ischemic . I suspect his shortness of breath is related to his advancing age, he is rather long layoff from regular running secondary to his knee issues and his moderate obstructive pulmonary disease. I think all these things have piled up to make him a little more short of breath when he runs out then cytostome last year. I discussed this at length with him and try to get him to be comfortable with his current level of fitness as it is quite excellent. He will followup with his PCP.

## 2012-07-24 ENCOUNTER — Telehealth: Payer: Self-pay | Admitting: Family Medicine

## 2012-07-24 NOTE — Telephone Encounter (Signed)
If patient returns call, ask if he is interested in making a follow up appt with Arlys John, this is a free visit paid by his insurance, see Rosalita Chessman with questions.

## 2012-07-28 ENCOUNTER — Ambulatory Visit (INDEPENDENT_AMBULATORY_CARE_PROVIDER_SITE_OTHER): Payer: Medicare HMO | Admitting: Home Health Services

## 2012-07-28 ENCOUNTER — Encounter: Payer: Self-pay | Admitting: Home Health Services

## 2012-07-28 VITALS — BP 116/73 | HR 64 | Temp 98.4°F | Ht 67.5 in | Wt 180.0 lb

## 2012-07-28 DIAGNOSIS — Z1211 Encounter for screening for malignant neoplasm of colon: Secondary | ICD-10-CM

## 2012-07-28 DIAGNOSIS — Z0000 Encounter for general adult medical examination without abnormal findings: Secondary | ICD-10-CM

## 2012-07-28 NOTE — Progress Notes (Signed)
Patient here for annual wellness visit, patient reports: Risk Factors/Conditions needing evaluation or treatment: Pt does not have any new risk factors that need evaluation. Home Safety: Pt lives with wife, 2 sons in 2 story home.  Pt reports having smoke detectors. Other Information: Corrective lens: Pt wears daily corrective lens. Visits eye dr as needed. Dentures: Pt does not have dentures, visits dentist as needed. Memory: Pt reports some memory loss. Patient's Mini Mental Score (recorded in doc. flowsheet): 26  Balance/Gait: Pt does not have any noticeable impairment Balance Abnormal Patient value  Sitting balance    Sit to stand    Attempts to arise    Immediate standing balance    Standing balance    Nudge    Eyes closed- Romberg    Tandem stance    Back lean    Neck Rotation    360 degree turn    Sitting down     Gait Abnormal Patient value  Initiation of gait    Step length-left    Step length-right    Step height-left    Step height-right    Step symmetry    Step continuity    Path deviation    Trunk movement    Walking stance        Annual Wellness Visit Requirements Recorded Today In  Medical, family, social history Past Medical, Family, Social History Section  Current providers Care team  Current medications Medications  Wt, BP, Ht, BMI Vital signs  Tobacco, alcohol, illicit drug use History  ADL Nurse Assessment  Depression Screening Nurse Assessment  Cognitive impairment Nurse Assessment  Mini Mental Status Document Flowsheet  Fall Risk Nurse Assessment  Home Safety Progress Note  End of Life Planning (welcome visit) Social Documentation  Medicare preventative services Progress Note  Risk factors/conditions needing evaluation/treatment Progress Note  Personalized health advice Patient Instructions, goals, letter  Diet & Exercise Social Documentation  Emergency Contact Social Documentation  Seat Belts Social Documentation  Sun  exposure/protection Social Documentation    Medicare Prevention Plan:   Recommended Medicare Prevention Screenings Men over 65 Test For Frequency Date of Last- BOLD if needed  Colorectal Cancer 1-10 yrs Gave pt hemoccult card  Prostate Cancer Never or yearly NI  Aortic Aneurysm Once if 65-75 with hx of smoking To discuss with PCP  Cholesterol 5 yrs 4/13  Diabetes yearly 4/13  HIV yearly declined  Influenza Shot yearly 10/12  Pneumonia Shot once 4/08  Zostavax Shot once 4/08   I have reviewed this visit and discussed with Arlys John and agree with her documentation. Wayne A. Sheffield Slider, MD

## 2012-07-28 NOTE — Patient Instructions (Signed)
1. Continue to exercise at the [facility]. 2. Continue to focus on eating 3-5 fruits and vegetables a day. 3. Please complete stool card and mail back to [place]. 4. If you become concerned about your breathing or knee, please schedule an appointment with Dr. Sheffield Slider.

## 2012-07-30 ENCOUNTER — Encounter: Payer: Self-pay | Admitting: Home Health Services

## 2012-07-31 ENCOUNTER — Other Ambulatory Visit: Payer: Self-pay | Admitting: Family Medicine

## 2012-07-31 DIAGNOSIS — Z136 Encounter for screening for cardiovascular disorders: Secondary | ICD-10-CM | POA: Insufficient documentation

## 2012-08-01 ENCOUNTER — Telehealth: Payer: Self-pay | Admitting: *Deleted

## 2012-08-01 NOTE — Telephone Encounter (Signed)
Called and informed patient of appointment at [facility] on 08/13/12 at 8:30 for abdominal aortic aneurysm duplex for screening.Vyolet Sakuma, Rodena Medin

## 2012-08-07 ENCOUNTER — Encounter: Payer: Self-pay | Admitting: Family Medicine

## 2012-08-07 LAB — HEMOCCULT GUIAC POC 1CARD (OFFICE)
Card #2 Fecal Occult Blod, POC: NEGATIVE
Card #3 Fecal Occult Blood, POC: NEGATIVE

## 2012-08-07 NOTE — Telephone Encounter (Signed)
Pt was scheduled and already seen for this visit, closing encounter.

## 2012-08-07 NOTE — Addendum Note (Signed)
Addended by: [place], Josalin Carneiro on: 08/07/2012 03:19 PM   Modules accepted: Orders

## 2012-08-13 ENCOUNTER — Encounter (INDEPENDENT_AMBULATORY_CARE_PROVIDER_SITE_OTHER): Payer: Medicare HMO

## 2012-08-13 DIAGNOSIS — E785 Hyperlipidemia, unspecified: Secondary | ICD-10-CM

## 2012-08-13 DIAGNOSIS — Z136 Encounter for screening for cardiovascular disorders: Secondary | ICD-10-CM

## 2012-10-21 ENCOUNTER — Ambulatory Visit (INDEPENDENT_AMBULATORY_CARE_PROVIDER_SITE_OTHER): Payer: Medicare HMO | Admitting: *Deleted

## 2012-10-21 DIAGNOSIS — Z23 Encounter for immunization: Secondary | ICD-10-CM

## 2013-06-03 ENCOUNTER — Encounter: Payer: Self-pay | Admitting: Family Medicine

## 2013-06-03 ENCOUNTER — Ambulatory Visit (INDEPENDENT_AMBULATORY_CARE_PROVIDER_SITE_OTHER): Payer: Medicare HMO | Admitting: Family Medicine

## 2013-06-03 VITALS — BP 134/58 | HR 71 | Temp 97.7°F | Ht 67.75 in | Wt 176.0 lb

## 2013-06-03 DIAGNOSIS — IMO0002 Reserved for concepts with insufficient information to code with codable children: Secondary | ICD-10-CM

## 2013-06-03 DIAGNOSIS — M5410 Radiculopathy, site unspecified: Secondary | ICD-10-CM | POA: Insufficient documentation

## 2013-06-03 MED ORDER — GABAPENTIN 100 MG PO CAPS: ORAL_CAPSULE | ORAL | Status: DC

## 2013-06-03 NOTE — Patient Instructions (Signed)
We will call you within 36 hours of your MRI (unless itis on a weekend). I am starting you on gabapentin. It will require a taper UP which is listed below. Take by tablet by mouth Day 1-3:   take one at night  Day 4-6:  Take 2 at night Day 7-10:  take 3 at night Day 11-14:  3 at night, 1 in am Day 15-18:   3 at night, 2 in am Day 19-21: 3 at night, 3 in am Day 22-25: 3 at night, 3 in am, 2 at lunch Day 26 forward : 3 at night, 3 in am, 3 at lunch  After we get you tapered up to this dose, which is 900 mg a day--let me either see you back or talk with you regarding where to go. The ideal dose is usually around 1800-2600 mg and it comes in pills that are higher doses so you take less actual pills. Please call me if you have new or worsening symptoms in the mean time or if you have questions. Great to see you!

## 2013-06-04 ENCOUNTER — Encounter: Payer: Self-pay | Admitting: Family Medicine

## 2013-06-04 NOTE — Assessment & Plan Note (Signed)
Radiculopathy with some symptoms consistent with neurogenic claudication. He said prior episode of bone spurs.Maryclare Labrador get MRI. May be partly related to spinal stenosis. In the interim I'll start him on gabapentin.

## 2013-06-04 NOTE — Progress Notes (Signed)
  Subjective:    Patient ID: William [name], male    DOB: [birth year], 72 y.o.   MRN: [id number]  HPI Right buttock and leg pain. Worsening. Over the last several months. He's been doing a lot of cross training and has actually given up running. He comes in now because he gained the point where he can't walk the dog. That's about a quarter mile walking he says he can do about it for her mom before the pain is 10 out of 10. He also start her right foot drop at about half-mile.  Past medical history significant for prior lumbar surgery where he had some bone spurs removed. Symptoms she's having now are very similar to those. Radicular hot pain down his right posterior lateral thigh down the calf and into the foot.   Review of Systems No fever, no sweats, no chills. No unusual weight change.    Objective:   Physical Exam  Vital signs are reviewed GENERAL: Well-developed male no acute distress BACK: No tenderness to palpation. No defect. Buttock is nontender to palpation in the sciatic notch area EXTREMITY: 5 out of 5 strength flexion and extension at the hip and knee. Dorsiflexion on the right is 4/5 and plantarflexion 5 out of 5. DTRs at the knee or 2+ at the ankle on the right is 1-2+ versus 2+ on the left. Sensation soft touch intact. Straight leg raise negative bilaterally in seated and supine position. HIPS: Internal/external rotation is intact and full and painless.      Assessment & Plan:

## 2013-06-07 ENCOUNTER — Ambulatory Visit
Admission: RE | Admit: 2013-06-07 | Discharge: 2013-06-07 | Disposition: A | Discharge status: Home / Self Care | Payer: Medicare HMO | Source: Ambulatory Visit | Attending: Family Medicine | Admitting: Family Medicine

## 2013-06-07 DIAGNOSIS — M5410 Radiculopathy, site unspecified: Secondary | ICD-10-CM

## 2013-06-09 ENCOUNTER — Other Ambulatory Visit: Payer: Self-pay | Admitting: Family Medicine

## 2013-06-09 DIAGNOSIS — M5410 Radiculopathy, site unspecified: Secondary | ICD-10-CM

## 2013-06-16 ENCOUNTER — Telehealth: Payer: Self-pay | Admitting: Family Medicine

## 2013-06-16 NOTE — Telephone Encounter (Signed)
Patient needs Dr Jennette Kettle to call him regarding making an appointment with orthopedic doctor.  He has been trying to get in touch for a couple days.

## 2013-06-25 ENCOUNTER — Telehealth: Payer: Self-pay | Admitting: Family Medicine

## 2013-06-25 NOTE — Telephone Encounter (Signed)
Patient is calling requesting to speak to Dr. Jennette Kettle about the Neurosurgeon and the medications that have been prescribed for him.

## 2013-06-25 NOTE — Telephone Encounter (Signed)
Patient wanting to let Dr. Jennette Kettle know that the gabapentin is making her extremely drowsy, very anxious feeling and is not helping much.  Pt also concerned why [facility] has not called him for referral.  Spoke with [facility] and they never received faxed referal form and notes.  I will refax to [phone].  Yenny Kosa, Darlyne Russian, CMA

## 2013-06-29 MED ORDER — HYDROCODONE-ACETAMINOPHEN 5-325 MG PO TABS: 1.0000 | ORAL_TABLET | Freq: Four times a day (QID) | ORAL | Status: DC | PRN

## 2013-06-29 NOTE — Telephone Encounter (Signed)
Pt stated it is making him feel very anxious.  Pt also states he doesn't feel it is helping him that much either. Instructed pt to call [facility] as well.  Wilene Pharo, Darlyne Russian, CMA

## 2013-06-29 NOTE — Telephone Encounter (Signed)
Kristen, I am unclear---does he think the gabapentin (neurontin) is making him MORE anxious or doe he just think itis not helping/ If making him more anxious, we can stop it and try somethingg else--if not helping we can either increase it or try something else. Please also tell him to call [facility] directly if he has not heard from them by mid afternoon today---sometimes the squeaky wheel really does get the grease.  THANKS! Denny Levy PS let me know

## 2013-06-29 NOTE — Telephone Encounter (Signed)
Spoke w pt--pain worsening. I will call [facility] and nudge them. Also will call in some pain medicine and increase neurontin to 3 tabs am, 3 pm and 4 qhs.

## 2013-06-29 NOTE — Telephone Encounter (Signed)
Called in patient's vicodin rx per dr Jennette Kettle.  Also gave him the message from Dr Jennette Kettle to increase night time dose of gabapentin to 400 mg.  Left message for new pt coordinator at [facility] to find out why they have not contacted pt yet.

## 2013-07-02 ENCOUNTER — Telehealth: Payer: Self-pay | Admitting: Family Medicine

## 2013-07-02 NOTE — Telephone Encounter (Signed)
Called [facility] again- they said pt had not been scheduled yet, left another message for Thayer Ohm the new pt coordinator to return my call.   Will forward to [employer] she needs to f/u on referral.

## 2013-07-02 NOTE — Telephone Encounter (Signed)
Pt contacted [facility] and was told there is no referral there. Please advise

## 2013-07-15 ENCOUNTER — Telehealth: Payer: Self-pay | Admitting: Family Medicine

## 2013-07-15 NOTE — Telephone Encounter (Signed)
Pt is calling for Dr. Jennette Kettle call him about his recent visit with her and the medication she prescribed to him. JW

## 2013-07-15 NOTE — Telephone Encounter (Signed)
Will fwd to MD.  According to last OV note with Dr. Jennette Kettle, she wanted to discuss gabapentin with patient.  Missey Hasley, Darlyne Russian, CMA

## 2013-07-20 NOTE — Telephone Encounter (Signed)
Pt reports taking gabapentin 1200 mg a day for two weeks.  Pt reports is has decreased his pain almost by half.  Pt wants to know if he should continue on this same dosage.  Patient also states that the [facility] neurosurgeons want to set him up appt for 2 epidural shots and pt wants to speak to Dr. Sheffield Slider.  Will fwd to MD.  Radene Ou, CMA

## 2013-07-20 NOTE — Telephone Encounter (Signed)
Dear [facility] Team Please find outy what the question is---has he seen the surgeon yet? THANKS! Denny Levy

## 2013-07-22 ENCOUNTER — Telehealth: Payer: Self-pay | Admitting: Family Medicine

## 2013-07-22 NOTE — Telephone Encounter (Signed)
Pt is wanting to talk to Dr. Sheffield Slider about his medication on what he needs to do since his other appointment is further away than the amount of medication he has.JW

## 2013-07-22 NOTE — Telephone Encounter (Signed)
Please ask what med?

## 2013-07-23 MED ORDER — GABAPENTIN 600 MG PO TABS: 600.0000 mg | ORAL_TABLET | Freq: Three times a day (TID) | ORAL | Status: DC

## 2013-07-23 NOTE — Telephone Encounter (Signed)
I spoke with him and he would like to continue the gabapentin and things that he could tolerated a larger dose so I sent in 600 mg to take one three times daily or a half tab three times daily when he is doing better.

## 2013-07-23 NOTE — Addendum Note (Signed)
Addended by: Zachery Dauer on: 07/23/2013 05:52 PM   Modules accepted: Orders

## 2013-08-25 ENCOUNTER — Encounter: Payer: Self-pay | Admitting: Family Medicine

## 2013-08-25 ENCOUNTER — Ambulatory Visit (INDEPENDENT_AMBULATORY_CARE_PROVIDER_SITE_OTHER): Payer: Medicare HMO | Admitting: Family Medicine

## 2013-08-25 VITALS — BP 124/70 | HR 65 | Temp 98.0°F | Ht 67.5 in | Wt 182.0 lb

## 2013-08-25 DIAGNOSIS — M545 Low back pain, unspecified: Secondary | ICD-10-CM

## 2013-08-25 DIAGNOSIS — N529 Male erectile dysfunction, unspecified: Secondary | ICD-10-CM

## 2013-08-26 ENCOUNTER — Encounter: Payer: Self-pay | Admitting: Family Medicine

## 2013-08-26 DIAGNOSIS — N529 Male erectile dysfunction, unspecified: Secondary | ICD-10-CM | POA: Insufficient documentation

## 2013-08-26 MED ORDER — TADALAFIL 20 MG PO TABS: 10.0000 mg | ORAL_TABLET | ORAL | Status: DC | PRN

## 2013-08-26 NOTE — Assessment & Plan Note (Signed)
Informed him that he can increase the gabapentin dose to 900 mg four times daily if helpful

## 2013-08-26 NOTE — Progress Notes (Signed)
  Subjective:    Patient ID: William [name], male    DOB: Dec 19, 1941, 72 y.o.   MRN: [id number]  HPI Back pain - has had one epidural injection with little relief and will have another from his orthopedist. Can't run, but can do other workouts. The gabapentin help some. Doesn't like to take hydrocodone due to constipation.   Impotence - Viagra didn't last long enough, so he would like to try a substitute.    Review of Systems     Objective:   Physical Exam Stands from sitting without apparent problem Limited exam since anxious to get to his son's running meet.        Assessment & Plan:

## 2013-09-15 ENCOUNTER — Encounter: Payer: Self-pay | Admitting: Home Health Services

## 2013-09-16 ENCOUNTER — Other Ambulatory Visit: Payer: Self-pay | Admitting: Neurosurgery

## 2013-09-25 ENCOUNTER — Ambulatory Visit (INDEPENDENT_AMBULATORY_CARE_PROVIDER_SITE_OTHER): Payer: Medicare HMO | Admitting: *Deleted

## 2013-09-25 ENCOUNTER — Telehealth: Payer: Self-pay | Admitting: Family Medicine

## 2013-09-25 DIAGNOSIS — Z23 Encounter for immunization: Secondary | ICD-10-CM

## 2013-09-25 NOTE — Telephone Encounter (Signed)
[name] has an appointment with you on 09/29. His insurance company called and since you are the new PCP they need pre-authorization for his surgery that was already scheduled. I explained that he didn't change his PCP but that his other doctor retired. I just wanted you to know since you will be seeing [name] on Monday. JW

## 2013-09-28 ENCOUNTER — Encounter: Payer: Self-pay | Admitting: Family Medicine

## 2013-09-28 ENCOUNTER — Ambulatory Visit (INDEPENDENT_AMBULATORY_CARE_PROVIDER_SITE_OTHER): Payer: Medicare HMO | Admitting: Family Medicine

## 2013-09-28 VITALS — BP 139/84 | HR 66 | Ht 67.5 in | Wt 178.1 lb

## 2013-09-28 DIAGNOSIS — Z1211 Encounter for screening for malignant neoplasm of colon: Secondary | ICD-10-CM

## 2013-09-28 DIAGNOSIS — N400 Enlarged prostate without lower urinary tract symptoms: Secondary | ICD-10-CM

## 2013-09-28 DIAGNOSIS — E785 Hyperlipidemia, unspecified: Secondary | ICD-10-CM

## 2013-09-28 MED ORDER — GABAPENTIN 600 MG PO TABS: 600.0000 mg | ORAL_TABLET | Freq: Four times a day (QID) | ORAL | Status: DC

## 2013-09-28 NOTE — Patient Instructions (Addendum)
Return to office in 6 months for routine check.   Dr. Randolm Idol will call you with your results.

## 2013-09-29 NOTE — Assessment & Plan Note (Signed)
Will check full cholesterol panel as LDL was only previously done. Patient is not currently on lipid lowering medication.
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
## 2013-09-29 NOTE — Assessment & Plan Note (Signed)
Patient declines colonoscopy. Will perform fecal occult blood testing x3.

## 2013-09-29 NOTE — Assessment & Plan Note (Signed)
Patient does not currently have sequelae of a large prostate. Prostate exam was not performed today. Would consider DRE and PSA if patient becomes symptomatic.

## 2013-10-01 ENCOUNTER — Encounter (HOSPITAL_COMMUNITY): Payer: Self-pay | Admitting: Pharmacy Technician

## 2013-10-03 NOTE — Pre-Procedure Instructions (Signed)
MARCQUIS RIDLON  10/03/2013   Your procedure is scheduled on:  October 14  Report to [facility] Entrance "A" [address] at [employer] AM.  Call this number if you have problems the morning of surgery: [phone]   Remember:   Do not eat food or drink liquids after midnight.   Take these medicines the morning of surgery with A SIP OF WATER: Neurontin, Tylenol (if needed)   STOP B- Stress, Biozinc, Ginko Biloba, DHEA, Multiple Vitamins, L-Arginine, Ginger, Aspirin October 7    Do not take Aspirin, Aleve, Naproxen, Advil, Ibuprofen, Vitamin, Herbs, or Supplements starting October 7  Do not wear jewelry, make-up or nail polish.  Do not wear lotions, powders, or perfumes. You may wear deodorant.  Do not shave 48 hours prior to surgery. Men may shave face and neck.  Do not bring valuables to the hospital.  [facility] is not responsible                  for any belongings or valuables.               Contacts, dentures or bridgework may not be worn into surgery.  Leave suitcase in the car. After surgery it may be brought to your room.  For patients admitted to the hospital, discharge time is determined by your                treatment team.              Special Instructions: Shower using CHG 2 nights before surgery and the night before surgery.  If you shower the day of surgery use CHG.  Use special wash - you have one bottle of CHG for all showers.  You should use approximately 1/3 of the bottle for each shower.   Please read over the following fact sheets that you were given: Pain Booklet, Coughing and Deep Breathing, Blood Transfusion Information and Surgical Site Infection Prevention

## 2013-10-05 ENCOUNTER — Encounter (HOSPITAL_COMMUNITY)
Admission: RE | Admit: 2013-10-05 | Discharge: 2013-10-05 | Disposition: A | Discharge status: Home / Self Care | Payer: Medicare HMO | Source: Ambulatory Visit | Attending: Neurosurgery | Admitting: Neurosurgery

## 2013-10-05 ENCOUNTER — Encounter (HOSPITAL_COMMUNITY): Payer: Self-pay

## 2013-10-05 DIAGNOSIS — Z01812 Encounter for preprocedural laboratory examination: Secondary | ICD-10-CM | POA: Insufficient documentation

## 2013-10-05 HISTORY — DX: Paresthesia of skin: R20.2

## 2013-10-05 HISTORY — DX: Anesthesia of skin: R20.0

## 2013-10-05 HISTORY — DX: Hyperlipidemia, unspecified: E78.5

## 2013-10-05 LAB — CBC
HCT: 45.5 % (ref 39.0–52.0)
MCH: 31.8 pg (ref 26.0–34.0)
MCV: 93.2 fL (ref 78.0–100.0)
RBC: 4.88 MIL/uL (ref 4.22–5.81)
RDW: 13.4 % (ref 11.5–15.5)
WBC: 6.1 10*3/uL (ref 4.0–10.5)

## 2013-10-05 LAB — BASIC METABOLIC PANEL
BUN: 12 mg/dL (ref 6–23)
CO2: 27 mEq/L (ref 19–32)
Calcium: 9.2 mg/dL (ref 8.4–10.5)
Chloride: 101 mEq/L (ref 96–112)
Creatinine, Ser: 0.71 mg/dL (ref 0.50–1.35)
Glucose, Bld: 87 mg/dL (ref 70–99)
Sodium: 137 mEq/L (ref 135–145)

## 2013-10-05 LAB — TYPE AND SCREEN

## 2013-10-05 LAB — ABO/RH: ABO/RH(D): O POS

## 2013-10-05 LAB — SURGICAL PCR SCREEN: MRSA, PCR: NEGATIVE

## 2013-10-06 LAB — POC HEMOCCULT BLD/STL (HOME/3-CARD/SCREEN)
Card #2 Fecal Occult Blod, POC: NEGATIVE
Fecal Occult Blood, POC: NEGATIVE

## 2013-10-06 NOTE — Addendum Note (Signed)
Addended by: [place], Mayo Faulk on: 10/06/2013 03:39 PM   Modules accepted: Orders

## 2013-10-07 ENCOUNTER — Encounter: Payer: Self-pay | Admitting: Family Medicine

## 2013-10-12 MED ORDER — CEFAZOLIN SODIUM-DEXTROSE 2-3 GM-% IV SOLR
2.0000 g | INTRAVENOUS | Status: AC
  Administered 2013-10-13 (×3): 2 g via INTRAVENOUS
  Filled 2013-10-12: qty 50

## 2013-10-13 ENCOUNTER — Inpatient Hospital Stay (HOSPITAL_COMMUNITY): Payer: Medicare HMO | Admitting: Anesthesiology

## 2013-10-13 ENCOUNTER — Inpatient Hospital Stay (HOSPITAL_COMMUNITY): Payer: Medicare HMO

## 2013-10-13 ENCOUNTER — Encounter (HOSPITAL_COMMUNITY): Payer: Self-pay | Admitting: *Deleted

## 2013-10-13 ENCOUNTER — Inpatient Hospital Stay (HOSPITAL_COMMUNITY)
Admission: RE | Admit: 2013-10-13 | Discharge: 2013-10-16 | DRG: 460 | Disposition: A | Discharge status: Home / Self Care | Payer: Medicare HMO | Source: Ambulatory Visit | Attending: Neurosurgery | Admitting: Neurosurgery

## 2013-10-13 ENCOUNTER — Encounter (HOSPITAL_COMMUNITY): Payer: Medicare HMO | Admitting: Anesthesiology

## 2013-10-13 ENCOUNTER — Encounter (HOSPITAL_COMMUNITY)
Admission: RE | Disposition: A | Discharge status: Home / Self Care | Payer: Commercial Managed Care - HMO | Source: Ambulatory Visit | Attending: Neurosurgery

## 2013-10-13 DIAGNOSIS — R110 Nausea: Secondary | ICD-10-CM | POA: Diagnosis not present

## 2013-10-13 DIAGNOSIS — S68118A Complete traumatic metacarpophalangeal amputation of other finger, initial encounter: Secondary | ICD-10-CM

## 2013-10-13 DIAGNOSIS — Z87891 Personal history of nicotine dependence: Secondary | ICD-10-CM

## 2013-10-13 DIAGNOSIS — M48061 Spinal stenosis, lumbar region without neurogenic claudication: Principal | ICD-10-CM | POA: Diagnosis present

## 2013-10-13 DIAGNOSIS — E785 Hyperlipidemia, unspecified: Secondary | ICD-10-CM | POA: Diagnosis present

## 2013-10-13 DIAGNOSIS — J4489 Other specified chronic obstructive pulmonary disease: Secondary | ICD-10-CM | POA: Diagnosis present

## 2013-10-13 DIAGNOSIS — Q762 Congenital spondylolisthesis: Secondary | ICD-10-CM

## 2013-10-13 DIAGNOSIS — Z7982 Long term (current) use of aspirin: Secondary | ICD-10-CM

## 2013-10-13 DIAGNOSIS — Z79899 Other long term (current) drug therapy: Secondary | ICD-10-CM

## 2013-10-13 DIAGNOSIS — J449 Chronic obstructive pulmonary disease, unspecified: Secondary | ICD-10-CM | POA: Diagnosis present

## 2013-10-13 HISTORY — PX: POSTERIOR LUMBAR FUSION 4 LEVEL: SHX6037

## 2013-10-13 SURGERY — POSTERIOR LUMBAR FUSION 4 LEVEL
Anesthesia: General | Site: Back | Wound class: Clean

## 2013-10-13 MED ORDER — PANTOPRAZOLE SODIUM 40 MG IV SOLR
40.0000 mg | Freq: Every day | INTRAVENOUS | Status: DC
  Administered 2013-10-13 – 2013-10-14 (×2): 40 mg via INTRAVENOUS
  Filled 2013-10-13 (×3): qty 40

## 2013-10-13 MED ORDER — GLYCOPYRROLATE 0.2 MG/ML IJ SOLN
INTRAMUSCULAR | Status: DC | PRN
  Administered 2013-10-13: .8 mg via INTRAVENOUS
  Administered 2013-10-13: 0.2 mg via INTRAVENOUS

## 2013-10-13 MED ORDER — FENTANYL CITRATE 0.05 MG/ML IJ SOLN
INTRAMUSCULAR | Status: DC | PRN
  Administered 2013-10-13: 100 ug via INTRAVENOUS
  Administered 2013-10-13 (×11): 50 ug via INTRAVENOUS

## 2013-10-13 MED ORDER — SODIUM CHLORIDE 0.9 % IV SOLN: 250.0000 mL | INTRAVENOUS | Status: DC

## 2013-10-13 MED ORDER — PROPOFOL 10 MG/ML IV BOLUS
INTRAVENOUS | Status: DC | PRN
  Administered 2013-10-13: 40 mg via INTRAVENOUS
  Administered 2013-10-13: 30 mg via INTRAVENOUS
  Administered 2013-10-13: 170 mg via INTRAVENOUS

## 2013-10-13 MED ORDER — FENTANYL CITRATE 0.05 MG/ML IJ SOLN
INTRAMUSCULAR | Status: AC
  Filled 2013-10-13: qty 2

## 2013-10-13 MED ORDER — KCL IN DEXTROSE-NACL 20-5-0.45 MEQ/L-%-% IV SOLN
80.0000 mL/h | INTRAVENOUS | Status: DC
  Administered 2013-10-13 – 2013-10-14 (×3): 80 mL/h via INTRAVENOUS
  Filled 2013-10-13 (×8): qty 1000

## 2013-10-13 MED ORDER — FENTANYL CITRATE 0.05 MG/ML IJ SOLN: 25.0000 ug | INTRAMUSCULAR | Status: DC | PRN

## 2013-10-13 MED ORDER — SODIUM CHLORIDE 0.9 % IR SOLN
Status: DC | PRN
  Administered 2013-10-13: 08:00:00

## 2013-10-13 MED ORDER — HYDROMORPHONE HCL PF 1 MG/ML IJ SOLN
INTRAMUSCULAR | Status: DC | PRN
  Administered 2013-10-13 (×3): .25 mg via INTRAVENOUS

## 2013-10-13 MED ORDER — LIDOCAINE HCL (CARDIAC) 20 MG/ML IV SOLN
INTRAVENOUS | Status: DC | PRN
  Administered 2013-10-13: 60 mg via INTRAVENOUS

## 2013-10-13 MED ORDER — ACETAMINOPHEN 650 MG RE SUPP: 650.0000 mg | RECTAL | Status: DC | PRN

## 2013-10-13 MED ORDER — ARTIFICIAL TEARS OP OINT
TOPICAL_OINTMENT | OPHTHALMIC | Status: DC | PRN
  Administered 2013-10-13: 1 via OPHTHALMIC

## 2013-10-13 MED ORDER — CEFAZOLIN SODIUM-DEXTROSE 2-3 GM-% IV SOLR
2.0000 g | Freq: Three times a day (TID) | INTRAVENOUS | Status: AC
  Administered 2013-10-13 – 2013-10-14 (×4): 2 g via INTRAVENOUS
  Filled 2013-10-13 (×4): qty 50

## 2013-10-13 MED ORDER — GABAPENTIN 600 MG PO TABS
600.0000 mg | ORAL_TABLET | Freq: Three times a day (TID) | ORAL | Status: DC
  Administered 2013-10-13 – 2013-10-16 (×9): 600 mg via ORAL
  Filled 2013-10-13 (×14): qty 1

## 2013-10-13 MED ORDER — DEXAMETHASONE SODIUM PHOSPHATE 4 MG/ML IJ SOLN
INTRAMUSCULAR | Status: DC | PRN
  Administered 2013-10-13: 8 mg via INTRAVENOUS

## 2013-10-13 MED ORDER — LACTATED RINGERS IV SOLN
INTRAVENOUS | Status: DC | PRN
  Administered 2013-10-13 (×4): via INTRAVENOUS

## 2013-10-13 MED ORDER — THROMBIN 20000 UNITS EX SOLR
CUTANEOUS | Status: DC | PRN
  Administered 2013-10-13 (×4): via TOPICAL

## 2013-10-13 MED ORDER — SODIUM CHLORIDE 0.9 % IJ SOLN: 3.0000 mL | INTRAMUSCULAR | Status: DC | PRN

## 2013-10-13 MED ORDER — PHENOL 1.4 % MT LIQD: 1.0000 | OROMUCOSAL | Status: DC | PRN

## 2013-10-13 MED ORDER — ALBUMIN HUMAN 5 % IV SOLN
INTRAVENOUS | Status: DC | PRN
  Administered 2013-10-13 (×2): via INTRAVENOUS

## 2013-10-13 MED ORDER — SODIUM CHLORIDE 0.9 % IR SOLN
Status: DC | PRN
  Administered 2013-10-13: 16:00:00

## 2013-10-13 MED ORDER — CEFAZOLIN SODIUM-DEXTROSE 2-3 GM-% IV SOLR
INTRAVENOUS | Status: AC
  Filled 2013-10-13: qty 50

## 2013-10-13 MED ORDER — ONDANSETRON HCL 4 MG/2ML IJ SOLN
INTRAMUSCULAR | Status: DC | PRN
  Administered 2013-10-13: 4 mg via INTRAMUSCULAR

## 2013-10-13 MED ORDER — OXYCODONE-ACETAMINOPHEN 5-325 MG PO TABS
1.0000 | ORAL_TABLET | ORAL | Status: DC | PRN
  Administered 2013-10-13 – 2013-10-16 (×6): 2 via ORAL
  Filled 2013-10-13 (×7): qty 2

## 2013-10-13 MED ORDER — ONDANSETRON HCL 4 MG/2ML IJ SOLN
4.0000 mg | INTRAMUSCULAR | Status: DC | PRN
  Administered 2013-10-13 – 2013-10-14 (×2): 4 mg via INTRAVENOUS
  Filled 2013-10-13 (×2): qty 2

## 2013-10-13 MED ORDER — BUPIVACAINE HCL (PF) 0.5 % IJ SOLN
INTRAMUSCULAR | Status: DC | PRN
  Administered 2013-10-13: 10 mL

## 2013-10-13 MED ORDER — ACETAMINOPHEN 325 MG PO TABS: 650.0000 mg | ORAL_TABLET | ORAL | Status: DC | PRN

## 2013-10-13 MED ORDER — ROCURONIUM BROMIDE 100 MG/10ML IV SOLN
INTRAVENOUS | Status: DC | PRN
  Administered 2013-10-13: 50 mg via INTRAVENOUS
  Administered 2013-10-13: 20 mg via INTRAVENOUS
  Administered 2013-10-13: 30 mg via INTRAVENOUS

## 2013-10-13 MED ORDER — SODIUM CHLORIDE 0.9 % IJ SOLN
3.0000 mL | Freq: Two times a day (BID) | INTRAMUSCULAR | Status: DC
  Administered 2013-10-13 – 2013-10-15 (×4): 3 mL via INTRAVENOUS

## 2013-10-13 MED ORDER — SODIUM CHLORIDE 0.9 % IV SOLN
10.0000 mg | INTRAVENOUS | Status: DC | PRN
  Administered 2013-10-13: 10 ug/min via INTRAVENOUS

## 2013-10-13 MED ORDER — HYDROMORPHONE HCL PF 1 MG/ML IJ SOLN
1.0000 mg | INTRAMUSCULAR | Status: DC | PRN
  Administered 2013-10-13 (×2): 1.5 mg via INTRAMUSCULAR
  Administered 2013-10-14: 1 mg via INTRAMUSCULAR
  Administered 2013-10-14: 1.5 mg via INTRAMUSCULAR
  Administered 2013-10-14: 1 mg via INTRAMUSCULAR
  Administered 2013-10-14: 1.5 mg via INTRAMUSCULAR
  Administered 2013-10-15 – 2013-10-16 (×2): 1 mg via INTRAMUSCULAR
  Filled 2013-10-13: qty 2
  Filled 2013-10-13 (×2): qty 1
  Filled 2013-10-13 (×2): qty 2
  Filled 2013-10-13 (×2): qty 1
  Filled 2013-10-13: qty 2

## 2013-10-13 MED ORDER — MENTHOL 3 MG MT LOZG: 1.0000 | LOZENGE | OROMUCOSAL | Status: DC | PRN

## 2013-10-13 MED ORDER — 0.9 % SODIUM CHLORIDE (POUR BTL) OPTIME
TOPICAL | Status: DC | PRN
  Administered 2013-10-13: 1000 mL

## 2013-10-13 MED ORDER — VECURONIUM BROMIDE 10 MG IV SOLR
INTRAVENOUS | Status: DC | PRN
  Administered 2013-10-13: 2 mg via INTRAVENOUS
  Administered 2013-10-13: 3 mg via INTRAVENOUS
  Administered 2013-10-13: 2 mg via INTRAVENOUS
  Administered 2013-10-13: 1 mg via INTRAVENOUS
  Administered 2013-10-13 (×6): 2 mg via INTRAVENOUS

## 2013-10-13 MED ORDER — PHENYLEPHRINE HCL 10 MG/ML IJ SOLN
INTRAMUSCULAR | Status: DC | PRN
  Administered 2013-10-13 (×2): 40 ug via INTRAVENOUS
  Administered 2013-10-13: 80 ug via INTRAVENOUS
  Administered 2013-10-13: 40 ug via INTRAVENOUS

## 2013-10-13 MED ORDER — NEOSTIGMINE METHYLSULFATE 1 MG/ML IJ SOLN
INTRAMUSCULAR | Status: DC | PRN
  Administered 2013-10-13: 5 mg via INTRAVENOUS

## 2013-10-13 MED ORDER — MIDAZOLAM HCL 5 MG/5ML IJ SOLN
INTRAMUSCULAR | Status: DC | PRN
  Administered 2013-10-13: 2 mg via INTRAVENOUS

## 2013-10-13 MED ORDER — SODIUM CHLORIDE 0.9 % IV SOLN
INTRAVENOUS | Status: DC | PRN
  Administered 2013-10-13: 14:00:00 via INTRAVENOUS

## 2013-10-13 SURGICAL SUPPLY — 72 items
ADH SKN CLS APL DERMABOND .7 (GAUZE/BANDAGES/DRESSINGS) ×2
ADH SKN CLS LQ APL DERMABOND (GAUZE/BANDAGES/DRESSINGS) ×2
APL SKNCLS STERI-STRIP NONHPOA (GAUZE/BANDAGES/DRESSINGS) ×1
BAG DECANTER FOR FLEXI CONT (MISCELLANEOUS) ×2 IMPLANT
BENZOIN TINCTURE PRP APPL 2/3 (GAUZE/BANDAGES/DRESSINGS) ×3 IMPLANT
BLADE SURG ROTATE 9660 (MISCELLANEOUS) ×2 IMPLANT
BNDG ADH 5X3 H2O RPLNT NS (GAUZE/BANDAGES/DRESSINGS) ×1
BNDG COHESIVE 3X5 WHT NS (GAUZE/BANDAGES/DRESSINGS) ×1 IMPLANT
BONE EQUIVA 10CC (Bone Implant) ×4 IMPLANT
BRUSH SCRUB EZ PLAIN DRY (MISCELLANEOUS) ×2 IMPLANT
BUR CUTTER 7.0 ROUND (BURR) ×5 IMPLANT
BUR MATCHSTICK NEURO 3.0 LAGG (BURR) ×2 IMPLANT
CANISTER SUCTION 2500CC (MISCELLANEOUS) ×2 IMPLANT
CONT SPEC 4OZ CLIKSEAL STRL BL (MISCELLANEOUS) ×4 IMPLANT
COVER BACK TABLE 24X17X13 BIG (DRAPES) IMPLANT
COVER TABLE BACK 60X90 (DRAPES) ×2 IMPLANT
DERMABOND ADHESIVE PROPEN (GAUZE/BANDAGES/DRESSINGS) ×2
DERMABOND ADVANCED (GAUZE/BANDAGES/DRESSINGS) ×2
DERMABOND ADVANCED .7 DNX12 (GAUZE/BANDAGES/DRESSINGS) ×2 IMPLANT
DERMABOND ADVANCED .7 DNX6 (GAUZE/BANDAGES/DRESSINGS) IMPLANT
DRAPE C-ARM 42X72 X-RAY (DRAPES) ×4 IMPLANT
DRAPE LAPAROTOMY 100X72X124 (DRAPES) ×2 IMPLANT
DRAPE SURG 17X23 STRL (DRAPES) ×4 IMPLANT
DRESSING TELFA 8X3 (GAUZE/BANDAGES/DRESSINGS) ×2 IMPLANT
DURAPREP 26ML APPLICATOR (WOUND CARE) ×2 IMPLANT
ELECT REM PT RETURN 9FT ADLT (ELECTROSURGICAL) ×2
ELECTRODE REM PT RTRN 9FT ADLT (ELECTROSURGICAL) ×1 IMPLANT
EVACUATOR 1/8 PVC DRAIN (DRAIN) ×2 IMPLANT
GAUZE SPONGE 4X4 16PLY XRAY LF (GAUZE/BANDAGES/DRESSINGS) ×2 IMPLANT
GLOVE BIOGEL M 8.0 STRL (GLOVE) ×2 IMPLANT
GLOVE ECLIPSE 8.0 STRL XLNG CF (GLOVE) ×4 IMPLANT
GLOVE INDICATOR 7.5 STRL GRN (GLOVE) ×1 IMPLANT
GLOVE SURG SS PI 7.0 STRL IVOR (GLOVE) ×3 IMPLANT
GOWN BRE IMP SLV AUR LG STRL (GOWN DISPOSABLE) IMPLANT
GOWN BRE IMP SLV AUR XL STRL (GOWN DISPOSABLE) ×6 IMPLANT
GOWN STRL REIN 2XL LVL4 (GOWN DISPOSABLE) IMPLANT
IMPLANT ARDIS PEEK 109X22 (Orthopedic Implant) ×2 IMPLANT
IMPLANT ARDIS PEEK 10X9X26 (Orthopedic Implant) ×2 IMPLANT
IMPLANT ARDIS PEEK 8X9X22 (Orthopedic Implant) ×2 IMPLANT
KIT BASIN OR (CUSTOM PROCEDURE TRAY) ×2 IMPLANT
KIT ROOM TURNOVER OR (KITS) ×2 IMPLANT
MARKER SKIN DUAL TIP RULER LAB (MISCELLANEOUS) ×1 IMPLANT
MILL MEDIUM DISP (BLADE) ×1 IMPLANT
NEEDLE HYPO 22GX1.5 SAFETY (NEEDLE) ×2 IMPLANT
NS IRRIG 1000ML POUR BTL (IV SOLUTION) ×2 IMPLANT
PACK LAMINECTOMY NEURO (CUSTOM PROCEDURE TRAY) ×2 IMPLANT
PAD ARMBOARD 7.5X6 YLW CONV (MISCELLANEOUS) ×6 IMPLANT
PATTIES SURGICAL .75X.75 (GAUZE/BANDAGES/DRESSINGS) ×1 IMPLANT
PEDIGUARD CURV (INSTRUMENTS) ×1 IMPLANT
PEEK OPTIMA 9X9X26MM (Peek) ×2 IMPLANT
ROD STRAIGHT 510MM 5.5 (Rod) ×1 IMPLANT
SCREW POLY 6.5X40MM (Screw) ×6 IMPLANT
SCREW POLY 6.5X45 (Screw) IMPLANT
SCREW POLY 6.5X45MM (Screw) ×4 IMPLANT
SCREW SEQUOIA 6.5X35MM (Screw) ×2 IMPLANT
SPEEDLINK 2 MED (Screw) ×1 IMPLANT
SPONGE GAUZE 4X4 12PLY (GAUZE/BANDAGES/DRESSINGS) ×2 IMPLANT
SPONGE LAP 4X18 X RAY DECT (DISPOSABLE) IMPLANT
SPONGE SURGIFOAM ABS GEL 100 (HEMOSTASIS) ×2 IMPLANT
STRIP CLOSURE SKIN 1/2X4 (GAUZE/BANDAGES/DRESSINGS) ×3 IMPLANT
SUT ETHILON 2 0 FS 18 (SUTURE) ×2 IMPLANT
SUT VIC AB 0 CT1 18XCR BRD8 (SUTURE) ×1 IMPLANT
SUT VIC AB 0 CT1 8-18 (SUTURE) ×4
SUT VIC AB 2-0 OS6 18 (SUTURE) ×8 IMPLANT
SUT VIC AB 3-0 CP2 18 (SUTURE) ×3 IMPLANT
SYR 20ML ECCENTRIC (SYRINGE) ×2 IMPLANT
TOP CLSR SEQUOIA (Orthopedic Implant) ×10 IMPLANT
TOWEL OR 17X24 6PK STRL BLUE (TOWEL DISPOSABLE) ×2 IMPLANT
TOWEL OR 17X26 10 PK STRL BLUE (TOWEL DISPOSABLE) ×2 IMPLANT
TRAP SPECIMEN MUCOUS 40CC (MISCELLANEOUS) IMPLANT
TRAY FOLEY CATH 14FRSI W/METER (CATHETERS) ×2 IMPLANT
WATER STERILE IRR 1000ML POUR (IV SOLUTION) ×2 IMPLANT

## 2013-10-13 NOTE — Anesthesia Postprocedure Evaluation (Signed)
  Anesthesia Post-op Note  Patient: William [name]  Procedure(s) Performed: Procedure(s) with comments: Lumbar Two-Three, Lumbar Three-Four, Lumbar Four-Five, Lumbar Five-Sacral One posterior lumbar interbody fusion with interbody prosthesis posterior lateral arthrodesis and posterior segmental instrumentation (N/A) - POSTERIOR LUMBAR FUSION 4 LEVEL  Patient Location: PACU  Anesthesia Type:General  Level of Consciousness: awake, alert , oriented and patient cooperative  Airway and Oxygen Therapy: Patient Spontanous Breathing and Patient connected to nasal cannula oxygen  Post-op Pain: none  Post-op Assessment: Post-op Vital signs reviewed, Patient's Cardiovascular Status Stable, Respiratory Function Stable, Patent Airway, No signs of Nausea or vomiting and Pain level controlled  Post-op Vital Signs: Reviewed and stable  Complications: patient has redness and small blister on chin, patient is not concerned, otherwise no apparent complications

## 2013-10-13 NOTE — Preoperative (Signed)
Beta Blockers   Reason not to administer Beta Blockers:Not Applicable 

## 2013-10-13 NOTE — Anesthesia Preprocedure Evaluation (Signed)
Anesthesia Evaluation  Patient identified by MRN, date of birth, ID band Patient awake    Reviewed: Allergy & Precautions, H&P , NPO status , Patient's Chart, lab work & pertinent test results  Airway Mallampati: II      Dental   Pulmonary COPD         Cardiovascular negative cardio ROS      Neuro/Psych    GI/Hepatic negative GI ROS, Neg liver ROS,   Endo/Other    Renal/GU negative Renal ROS     Musculoskeletal   Abdominal   Peds  Hematology   Anesthesia Other Findings   Reproductive/Obstetrics                           Anesthesia Physical Anesthesia Plan  ASA: III  Anesthesia Plan: General   Post-op Pain Management:    Induction: Intravenous  Airway Management Planned: Oral ETT  Additional Equipment:   Intra-op Plan:   Post-operative Plan: Possible Post-op intubation/ventilation  Informed Consent: I have reviewed the patients History and Physical, chart, labs and discussed the procedure including the risks, benefits and alternatives for the proposed anesthesia with the patient or authorized representative who has indicated his/her understanding and acceptance.   Dental advisory given  Plan Discussed with: CRNA, Anesthesiologist and Surgeon  Anesthesia Plan Comments:         Anesthesia Quick Evaluation

## 2013-10-13 NOTE — Op Note (Signed)
Preop diagnosis: Spinal stenosis with listhesis L2-3 L3-4 L4-5 L5-S1 Postoperative diagnosis: Same Procedure: Bilateral L2-3 L3-4 L4-5 L5-S1 decompressive laminectomy Bilateral L2-3 L3-4 L4-5 L5-S1 microdiscectomy L2-3 L3-4 L4-5 L5-S1 posterior lumbar interbody fusion with peek interbody spacer L2-3 L3-4 L4-5 L5-S1 segmental instrumentation with [employer] pedicle screw instrumentation L2-3 L3-4 L4-5 L5-S1 posterolateral fusion Surgeon: Insurance risk surveyor: [place]  After being placed the prone position the patient's back was prepped and draped in the usual sterile fashion. Previous lumbar incision was opened up carried down to the fascia. Above the old surgery whether or spinous processes incision was carried on the spinous processes and subperiosteal dissection was then carried out bilaterally on the spinous processes lamina facet joint. We then dissected down through the soft tissue to identify the lamina of L4-L5 and S1 as well as the facet joint and the patient had previous midline decompressive surgery. We're able to identify the anatomy without difficulty and placed self-retaining retractors for exposure and x-ray showed approach the appropriate levels. Spinous processes of L2 and L3 were removed as there was no spinous process of L4-L5. Starting the patient's left side and he laminectomies were performed at L2-L3 L4-L5 and S1. Thorough decompression was carried out of the thecal sac and nerve roots. Similar decompression was then carried out on the opposite side and residual midline structures were removed to complete the bilateral decompression. At this time disc spaces were identified and thoroughly cleaned out with pituitary rongeurs and curettes at L2-3 L3-4 L4-5 and L5-S1. Thorough displaced cleanout was carried out at the same time great care was taken to injury to the neural elements what was successfully done. We then prepared the disc spaces for interbody fusion. At L 5 S1 we distracted the disc  space up to an 8 mm size and felt this was a good choice. We thoroughly cleaned off the endplates.the disc out with a variety of instruments. We placed cages which were filled with autologous bone morselized allograft at this level which were 8 x 9 x 22 mm. We disorder procedures at L4-5 we placed 10 x 9 x 26 depth cages at L3-4 we placed 10 x 9 x 22 mm cages at L2-3 were placed 9 x 9 x 26 mm cages. These were followed in excellent position under fluoroscopy. At each level prior to placing the second cage we placed a mixture of autologous bone morselized allograft it within the interspace to help with interbody fusion. At this time pedicle screws were placed placed bilaterally at L2-3-4 5 and S1. This was done in standard fashion with a drill hole entry point followed by placing the ultrasonic guide it pedicle all. Tapped with a 5.5 mm tap and placed 6.5 x 40 mm screws at L2-L3 and L5. We placed a 6.5 x 45 mm screws bilaterally at L4 and placed 6.5 x 35 mm screws at S1. Good placement was confirmed with AP lateral fluoroscopy. We then decorticated the far lateral region placed a mixture of autologous bone morselized allograft for a posterior lateral fusion from L2-S1 bilaterally. We then irrigated copiously metabolic irrigation controlled any bleeding with upper coagulation and Gelfoam. We performed rods appropriately length and secured them to the top of the screws. We then did tightening and final tightening with torque and counter torque at all 5 levels bilaterally. We compressed a superior screw the inferior screw bilaterally at each level. We then placed a cross-link and final fluoroscopy looked excellent. Left an epidural drain in the epidural space and brought out  through a separate stab incision. The was then closed in multiple layers of Vicryl on the muscle fascia subcutaneous and subcuticular tissues. A running locking nylon was placed on the skin. Shortness was then applied the patient was extubated and  taken to cut them in stable condition.

## 2013-10-13 NOTE — Transfer of Care (Signed)
Immediate Anesthesia Transfer of Care Note  Patient: William [name]  Procedure(s) Performed: Procedure(s) with comments: Lumbar Two-Three, Lumbar Three-Four, Lumbar Four-Five, Lumbar Five-Sacral One posterior lumbar interbody fusion with interbody prosthesis posterior lateral arthrodesis and posterior segmental instrumentation (N/A) - POSTERIOR LUMBAR FUSION 4 LEVEL  Patient Location: PACU  Anesthesia Type:General  Level of Consciousness: sedated and confused  Airway & Oxygen Therapy: Patient Spontanous Breathing and Patient connected to face mask oxygen  Post-op Assessment: Report given to PACU RN and Post -op Vital signs reviewed and stable  Post vital signs: Reviewed and stable  Complications: No apparent anesthesia complications

## 2013-10-13 NOTE — H&P (Signed)
William [name] is an 72 y.o. male.   Chief Complaint: Back and right leg pain HPI: The patient is a 72 year old gentleman who is evaluating office for back pain with radiation the right leg. The problem started 2 months ago. Had back surgery back in 2008 did well at that time. For the thumb started 2 months ago and most of the problem until the last few months results.) rods and pain medicine and and sent for evaluation. He had an MRI scan of the lumbar spine. Synovial fluid is isn't better. Denies numbness or tingling. It is activity because of his difficulty. After evaluation office the MRI scan was reviewed which showed listhesis and stenosis at multiple levels in his back to L2-3 L3-4 L4-5 L5-S1. His epidural shots which gave him no relief. He returned back to the office with the options were discussed. Starting the large nature of the procedure Things in detail the patient requested surgery now comes for a 4 level posterior lumbar interbody fusion with pedicle screw fixation.  Past Medical History  Diagnosis Date  . Dyslipidemia (high LDL; low HDL)   . Numbness and tingling of right leg     Past Surgical History  Procedure Laterality Date  . Lumbar disc surgery  09/2007    bone spurs, stenosis, L3-4, 4-5  . Knee arthroscopy w/ meniscal repair  2008    left  . Knee arthroscopy w/ meniscal repair  11/2011    left  . Laceration repair  1962    forehead  . Small intestine surgery  1963    ileitis complications  . Bone chip removed from right heel  1998  . Finger amputation      injured by table saw, left 4th and 5th fingers  . Skin graft      from right thigh to left forehead    Family History  Problem Relation Age of Onset  . Heart disease Father   . Cancer Maternal Grandfather   . Cancer Maternal Grandmother    Social History:  reports that he quit smoking about 27 years ago. He has never used smokeless tobacco. He reports that he drinks alcohol. He reports that he does not  use illicit drugs.  Allergies:  Allergies  Allergen Reactions  . Hydrocodone     Causes constipation even with stool softner    Medications Prior to Admission  Medication Sig Dispense Refill  . acetaminophen (TYLENOL) 500 MG tablet Take 500 mg by mouth every 6 (six) hours as needed for pain.      Marland Kitchen aspirin EC 325 MG tablet Take 325 mg by mouth daily.      Marland Kitchen docusate sodium (COLACE) 100 MG capsule Take 100 mg by mouth daily.       Marland Kitchen gabapentin (NEURONTIN) 600 MG tablet Take 600 mg by mouth 4 (four) times daily - after meals and at bedtime.      . Ginger, Zingiber officinalis, (GINGER ROOT) 550 MG CAPS Take 550 mg by mouth daily.      Marland Kitchen L-Arginine 500 MG CAPS Take 500 mg by mouth daily.       . Multiple Vitamin (MULTIVITAMIN WITH MINERALS) TABS tablet Take 1 tablet by mouth daily.      . Nutritional Supplements (DHEA PO) Take 1 capsule by mouth daily.      Marland Kitchen omega-3 acid ethyl esters (LOVAZA) 1 G capsule Take 1 g by mouth daily.      Marland Kitchen OVER THE COUNTER MEDICATION Take 1 tablet  by mouth daily. Ginko Biloba      . OVER THE COUNTER MEDICATION Take 1 tablet by mouth daily. Biozinc      . Vitamins-Lipotropics (B-STRESS) CAPS Take 1 capsule by mouth daily.        No results found for this or any previous visit (from the past 48 hour(s)). No results found.  Review of systems is positive for the leg pain when he walks well with back and leg pain he denies it from size 12 chest pain asthma emphysema or indigestion.  Pulse 54, temperature 97.4 F (36.3 C), temperature source Oral, resp. rate 20, SpO2 97.00%.  The patient is awake alert and oriented. His no facial asymmetry. His gait is nonantalgic. He has absent reflexes. Strength however is intact. Assessment/Plan Impression is that of stenosis and listhesis at L2-3 L3-4 L4-5 and L5-S1. The previous surgery in the mid lumbar spine. The plan is for a 4 level posterior lumbar interbody fusion with pedicle screw fixation  appeared  Reinaldo Meeker, MD 10/13/2013, 7:31 AM

## 2013-10-14 ENCOUNTER — Encounter (HOSPITAL_COMMUNITY): Payer: Self-pay | Admitting: Neurosurgery

## 2013-10-14 LAB — POCT I-STAT EG7
Acid-base deficit: 1 mmol/L (ref 0.0–2.0)
Bicarbonate: 25 mEq/L — ABNORMAL HIGH (ref 20.0–24.0)
Calcium, Ion: 1.15 mmol/L (ref 1.13–1.30)
HCT: 38 % — ABNORMAL LOW (ref 39.0–52.0)
O2 Saturation: 99 %
Potassium: 4.2 mEq/L (ref 3.5–5.1)
Sodium: 137 mEq/L (ref 135–145)
TCO2: 26 mmol/L (ref 0–100)
pCO2, Ven: 46 mmHg (ref 45.0–50.0)
pO2, Ven: 145 mmHg — ABNORMAL HIGH (ref 30.0–45.0)

## 2013-10-14 MED ORDER — PROMETHAZINE HCL 25 MG/ML IJ SOLN
12.5000 mg | Freq: Four times a day (QID) | INTRAMUSCULAR | Status: DC | PRN
  Administered 2013-10-14: 12.5 mg via INTRAVENOUS
  Filled 2013-10-14: qty 1

## 2013-10-14 NOTE — Evaluation (Signed)
Occupational Therapy Evaluation Patient Details Name: William [name] MRN: [id number] DOB: October 16, 1941 Today's Date: 10/14/2013 Time: 4782-9562 OT Time Calculation (min): 28 min  OT Assessment / Plan / Recommendation History of present illness Pt is a 72 yo male s/p Lumbar Two-Three, Lumbar Three-Four, Lumbar Four-Five, Lumbar Five-Sacral One posterior lumbar interbody fusion with interbody prosthesis posterior lateral arthrodesis and posterior segmental instrumention.   Clinical Impression   Patient is s/p L2-S1 Fusion surgery resulting in functional limitations due to the deficits listed below (see OT problem list). No brace ordered at this time. Rn called to room and notified therapist that she now has orders for lumbar brace per Dr Gerlene Fee Patient will benefit from skilled OT acutely to increase independence and safety with ADLS to allow discharge home vs rehab pending progress. Pt with limited eval due to vomiting and no brace present at this time.     OT Assessment  Patient needs continued OT Services    Follow Up Recommendations  Other (comment) (TBA )    Barriers to Discharge      Equipment Recommendations       Recommendations for Other Services    Frequency  Min 2X/week    Precautions / Restrictions Precautions Precautions: Back;Fall Precaution Booklet Issued: Yes (comment) Precaution Comments: provided and reviewed with patient, hemovac Required Braces or Orthoses: Spinal Brace Spinal Brace: Lumbar corset Restrictions Weight Bearing Restrictions: No   Pertinent Vitals/Pain Groggy, dizziness, head pain not rated vomiting    ADL  Grooming: Wash/dry hands;Wash/dry face;Modified independent Where Assessed - Grooming: Supported sitting (able to hold basin during vomiting/ wiping mouth) Lower Body Dressing: +1 Total assistance Where Assessed - Lower Body Dressing: Supported sit to Pharmacist, hospital: +2 Total assistance Toilet Transfer: Patient Percentage:  70% Toilet Transfer Method: Stand pivot Statistician Equipment: Raised toilet seat with arms (or 3-in-1 over toilet) Equipment Used: Other (comment) (hand held (A)) Transfers/Ambulation Related to ADLs: Pt completed transfer from chair to bed due to prolonged sitting x 1 hr per patient self report. pt educated on change of position every 45 minutes. Pt educated that sitting in the chair all day is not a good idea . Pt and wife expressed understanding ADL Comments: Pt vomiting x4 basin of liquid and noted to have coffee ground dark blood in vomit. The coffee ground were present but not present in all x4 basins. Coffee grounds more present in last two basin catches. pt educated on back precautions but due to nausea limtied education occurred. Pt apologizing to therapist for vomiting and therapist educated on the reason for return to supine side lying. pt reports decr pain in this position. pt reports dizziness, groggy headed, head pain in chair.     OT Diagnosis: Generalized weakness;Acute pain  OT Problem List: Decreased strength;Decreased activity tolerance;Impaired balance (sitting and/or standing);Decreased safety awareness;Decreased knowledge of use of DME or AE;Decreased knowledge of precautions;Pain OT Treatment Interventions: Self-care/ADL training;Therapeutic exercise;DME and/or AE instruction;Therapeutic activities;Patient/family education;Balance training   OT Goals(Current goals can be found in the care plan section) Acute Rehab OT Goals Patient Stated Goal: to go home OT Goal Formulation: With patient/family Time For Goal Achievement: 10/28/13 Potential to Achieve Goals: Good ADL Goals Pt Will Perform Grooming: with supervision;standing Pt Will Perform Upper Body Bathing: with supervision;sitting Pt Will Perform Lower Body Bathing: with supervision;sit to/from stand Pt Will Transfer to Toilet: with supervision;ambulating;bedside commode  Visit Information  Last OT Received On:  10/14/13 Assistance Needed: +1 History of Present Illness: Pt is a  72 yo male s/p Lumbar Two-Three, Lumbar Three-Four, Lumbar Four-Five, Lumbar Five-Sacral One posterior lumbar interbody fusion with interbody prosthesis posterior lateral arthrodesis and posterior segmental instrumention.       Prior Functioning     Home Living Family/patient expects to be discharged to:: Private residence Living Arrangements: Spouse/significant other;Children Available Help at Discharge: Family Type of Home: House Home Access: Stairs to enter Secretary/administrator of Steps: 2 Entrance Stairs-Rails: None Home Layout: One level Home Equipment: Crutches Prior Function Level of Independence: Independent Communication Communication:  (wears glasses) Dominant Hand: Right         Vision/Perception Vision - History Baseline Vision: Wears glasses all the time Patient Visual Report: No change from baseline Vision - Assessment Eye Alignment: Within Functional Limits Vision Assessment: Vision not tested   Cognition  Cognition Arousal/Alertness: Awake/alert Behavior During Therapy: WFL for tasks assessed/performed Overall Cognitive Status: Within Functional Limits for tasks assessed    Extremity/Trunk Assessment Upper Extremity Assessment Upper Extremity Assessment: Overall WFL for tasks assessed Lower Extremity Assessment Lower Extremity Assessment: Defer to PT evaluation Cervical / Trunk Assessment Cervical / Trunk Assessment: Other exceptions (surgery)     Mobility Bed Mobility Bed Mobility: Rolling Right;Rolling Left;Sit to Sidelying Right Rolling Right: 4: Min assist Rolling Left: 4: Min assist Sit to Sidelying Right: 3: Mod assist Transfers Sit to Stand: 1: +2 Total assist Sit to Stand: Patient Percentage: 70% Stand to Sit: 1: +2 Total assist Stand to Sit: Patient Percentage: 70% Details for Transfer Assistance: Assist for stability and controlled movement in setting of extreme  nausea     Exercise     Balance Balance Balance Assessed: Yes Static Sitting Balance Static Sitting - Balance Support: Bilateral upper extremity supported;Feet supported Static Sitting - Level of Assistance: 4: Min assist   End of Session OT - End of Session Activity Tolerance: Patient limited by pain;Treatment limited secondary to medical complications (Comment) (vomiting) Patient left: in bed;with call bell/phone within reach Nurse Communication: Mobility status;Precautions  GO     Harolyn Rutherford 10/14/2013, 10:55 AM Pager: [phone]

## 2013-10-14 NOTE — Evaluation (Signed)
Physical Therapy Evaluation Patient Details Name: William [name] MRN: [id number] DOB: 19-Jun-1941 Today's Date: 10/14/2013 Time: 0921-0949 PT Time Calculation (min): 28 min  PT Assessment / Plan / Recommendation History of Present Illness  Pt is a 72 yo male s/p Lumbar Two-Three, Lumbar Three-Four, Lumbar Four-Five, Lumbar Five-Sacral One posterior lumbar interbody fusion with interbody prosthesis posterior lateral arthrodesis and posterior segmental instrumention.  Clinical Impression  Patient demonstrates deficits in functional mobility as indicated below. Pt will benefit from continued skilled PT to address deficits and maximize function. Will continue to see as indicated and progress activity as tolerated. Of note, evaluation was limited secondary to patient with intense vomiting with activity and no back brace available at time of session.      PT Assessment  Patient needs continued PT services    Follow Up Recommendations   (limited evaluation, TBD)          Equipment Recommendations  Rolling walker with 5" wheels       Frequency Min 5X/week    Precautions / Restrictions Precautions Precautions: Back;Fall Precaution Booklet Issued: Yes (comment) Precaution Comments: provided and reviewed with patient Required Braces or Orthoses: Spinal Brace Spinal Brace: Lumbar corset Restrictions Weight Bearing Restrictions: No   Pertinent Vitals/Pain Patient reports "groggy, dizzy headed" but no pain reported      Mobility  Bed Mobility Bed Mobility: Rolling Right;Rolling Left;Sit to Sidelying Right Rolling Right: 4: Min assist Rolling Left: 4: Min assist Sit to Sidelying Right: 3: Mod assist Transfers Transfers: Sit to Stand;Stand to Sit;Stand Pivot Transfers Sit to Stand: 1: +2 Total assist Sit to Stand: Patient Percentage: 70% Stand to Sit: 1: +2 Total assist Stand to Sit: Patient Percentage: 70% Stand Pivot Transfers: 1: +2 Total assist Stand Pivot Transfers: Patient  Percentage: 70% Details for Transfer Assistance: Assist for stability and controlled movement in setting of extreme nausea Ambulation/Gait Ambulation/Gait Assistance: Not tested (comment) (too nauseated to perform)        PT Diagnosis: Difficulty walking;Abnormality of gait;Generalized weakness;Acute pain  PT Problem List: Decreased strength;Decreased range of motion;Decreased activity tolerance;Decreased balance;Decreased mobility;Pain PT Treatment Interventions: DME instruction;Gait training;Stair training;Functional mobility training;Therapeutic activities;Therapeutic exercise;Balance training;Patient/family education     PT Goals(Current goals can be found in the care plan section) Acute Rehab PT Goals Patient Stated Goal: to go home PT Goal Formulation: With patient/family Time For Goal Achievement: 10/28/13 Potential to Achieve Goals: Good  Visit Information  Last PT Received On: 10/14/13 Assistance Needed: +1 History of Present Illness: Pt is a 72 yo male s/p Lumbar Two-Three, Lumbar Three-Four, Lumbar Four-Five, Lumbar Five-Sacral One posterior lumbar interbody fusion with interbody prosthesis posterior lateral arthrodesis and posterior segmental instrumention.       Prior Functioning  Home Living Family/patient expects to be discharged to:: Private residence Living Arrangements: Spouse/significant other;Children Available Help at Discharge: Family Type of Home: House Home Access: Stairs to enter Secretary/administrator of Steps: 2 Entrance Stairs-Rails: None Home Layout: One level Home Equipment: Crutches Prior Function Level of Independence: Independent Communication Communication:  (wears glasses) Dominant Hand: Right    Cognition  Cognition Arousal/Alertness: Awake/alert Behavior During Therapy: WFL for tasks assessed/performed Overall Cognitive Status: Within Functional Limits for tasks assessed    Extremity/Trunk Assessment Upper Extremity  Assessment Upper Extremity Assessment: Defer to OT evaluation Lower Extremity Assessment Lower Extremity Assessment: Generalized weakness (hx of peripheral neuropathy and paresthesias)      End of Session PT - End of Session Activity Tolerance: Treatment limited secondary to medical complications (  Comment) (projectile vomitting x 4) Patient left: in bed;with call bell/phone within reach;with nursing/sitter in room;with family/visitor present Nurse Communication: Mobility status (vommiting x3)  GP     Fabio Asa 10/14/2013, 10:24 AM Charlotte Crumb, PT DPT  [phone]

## 2013-10-14 NOTE — Progress Notes (Signed)
Patient ID: William [name], male   DOB: 06-17-1941, 72 y.o.   MRN: [id number] Subjective: Patient reports nausea  Objective: Vital signs in last 24 hours: Temp:  [97.5 F (36.4 C)-98.2 F (36.8 C)] 98.2 F (36.8 C) (10/15 0544) Pulse Rate:  [85-107] 85 (10/15 0544) Resp:  [10-18] 18 (10/15 0544) BP: (105-154)/(66-86) 111/73 mmHg (10/15 0544) SpO2:  [93 %-97 %] 95 % (10/15 0544) Weight:  [87.408 kg (192 lb 11.2 oz)] 87.408 kg (192 lb 11.2 oz) (10/14 1915)  Intake/Output from previous day: 10/14 0701 - 10/15 0700 In: 4525 [I.V.:3900; Blood:125; IV Piggyback:500] Out: 2680 [Urine:1455; Drains:625; Blood:600] Intake/Output this shift:    Wound:clean and dry  Lab Results:  Recent Labs  10/13/13 1626  HGB 12.9*  HCT 38.0*   BMET  Recent Labs  10/13/13 1626  NA 137  K 4.2    Studies/Results: Dg Lumbar Spine 2-3 Views  10/13/2013   CLINICAL DATA:  Back pain  EXAM: LUMBAR SPINE - 2-3 VIEW; DG C-ARM GT 120 MIN  COMPARISON:  06/07/2013 MRI  FINDINGS: C-arm films document L2- S1 PLIF.  IMPRESSION: Satisfactory position and alignment.   Electronically Signed   By: Davonna Belling M.D.   On: 10/13/2013 16:54   Dg C-arm Gt 120 Min  10/13/2013   CLINICAL DATA:  Back pain  EXAM: LUMBAR SPINE - 2-3 VIEW; DG C-ARM GT 120 MIN  COMPARISON:  06/07/2013 MRI  FINDINGS: C-arm films document L2- S1 PLIF.  IMPRESSION: Satisfactory position and alignment.   Electronically Signed   By: Davonna Belling M.D.   On: 10/13/2013 16:54    Assessment/Plan: Doing well except for the nausea. Will get him to increase activity. Will change from, zofran to phenergan.   LOS: 1 day  as above   Reinaldo Meeker, MD 10/14/2013, 9:08 AM

## 2013-10-14 NOTE — Progress Notes (Signed)
Orthopedic Tech Progress Note Patient Details:  William [name] 15-Jul-1941 [id number]  Patient ID: [name], male   DOB: 1941-11-08, 72 y.o.   MRN: [id number]   [name] 10/14/2013, 9:40 [facility] bio-tech for lumbar brace.

## 2013-10-14 NOTE — Progress Notes (Signed)
Pt ambulated 15 feet via walker and 2 assist. During ambulation patient became nauseated and vomitted clear liquids x2. Pt states he is feeling better at this time. Foley cath removed. Pt educated on proper back precautions. Will continue to monitor.

## 2013-10-14 NOTE — Progress Notes (Signed)
   CARE MANAGEMENT NOTE 10/14/2013  Patient:  William [name], William [name]   Account Number:  [id number]  Date Initiated:  10/14/2013  Documentation initiated by:  Jiles Crocker  Subjective/Objective Assessment:   ADMITTED FOR SURGERY FOR Bilateral L2-3 L3-4 L4-5 L5-S1 decompressive laminectomy     Action/Plan:   LIVES AT HOME WITH SPOUSE; CM FOLLOWING FOR DCP   Anticipated DC Date:  10/18/2013   Anticipated DC Plan:  POSSIBLY HOME W HOME HEALTH SERVICES AWAITING ON PT/OT EVALS;   DC Planning Services  CM consult          Status of service:  In process, will continue to follow Medicare Important Message given?  NA - LOS <3 / Initial given by admissions (If response is "NO", the following Medicare IM given date fields will be blank)  Per UR Regulation:  Reviewed for med. necessity/level of care/duration of stay  Comments:  10/15/2014Abelino Derrick RN,BSN,MHA [phone]

## 2013-10-15 MED ORDER — PANTOPRAZOLE SODIUM 40 MG PO TBEC
40.0000 mg | DELAYED_RELEASE_TABLET | Freq: Every day | ORAL | Status: DC
  Administered 2013-10-15 – 2013-10-16 (×2): 40 mg via ORAL
  Filled 2013-10-15: qty 1

## 2013-10-15 MED FILL — Heparin Sodium (Porcine) Inj 1000 Unit/ML: INTRAMUSCULAR | Qty: 30 | Status: AC

## 2013-10-15 MED FILL — Sodium Chloride IV Soln 0.9%: INTRAVENOUS | Qty: 1000 | Status: AC

## 2013-10-15 NOTE — Progress Notes (Signed)
Patient ID: William [name], male   DOB: 05/12/1941, 72 y.o.   MRN: [id number] Afebrile, vss No new neuro issues Nausea much improved Ambulating weell Plan d/c tomorrow.

## 2013-10-15 NOTE — Progress Notes (Signed)
Occupational Therapy Treatment Patient Details Name: LOVIE AGRESTA MRN: [id number] DOB: November 04, 1941 Today's Date: 10/15/2013 Time: 4098-1191 OT Time Calculation (min): 11 min  OT Assessment / Plan / Recommendation  History of present illness Pt is a 72 yo male s/p Lumbar Two-Three, Lumbar Three-Four, Lumbar Four-Five, Lumbar Five-Sacral One posterior lumbar interbody fusion with interbody prosthesis posterior lateral arthrodesis and posterior segmental instrumention.   OT comments  Pt progressing toward OT goals and no further questions at this time. Pt will have family assistance upon d/c home.  Follow Up Recommendations  No OT follow up    Barriers to Discharge       Equipment Recommendations  None recommended by OT    Recommendations for Other Services    Frequency Min 2X/week   Progress towards OT Goals Progress towards OT goals: Progressing toward goals  Plan Discharge plan remains appropriate    Precautions / Restrictions Precautions Precautions: Back;Fall Precaution Comments: educated on back brace and don/ doff  Required Braces or Orthoses: Spinal Brace Spinal Brace: Lumbar corset   Pertinent Vitals/Pain None reported hemovac drain in place    ADL  Eating/Feeding: Modified independent;Independent Where Assessed - Eating/Feeding: Chair Lower Body Dressing: Modified independent Where Assessed - Lower Body Dressing: Unsupported sitting (able to cross bil LE) Toilet Transfer: Modified independent Toilet Transfer Method: Sit to stand Toilet Transfer Equipment: Raised toilet seat with arms (or 3-in-1 over toilet) Tub/Shower Transfer: Other (comment) (plans to sponge bath) Equipment Used: Back brace ADL Comments: Pt plans to sponge bath upon d/c home. Pt asking about showering at [facility]. Pt advised to avoid [employer] travel, transfers and showering until follow up with MD. Pt educated that if attempting home tub transfer to complete with clothes on with family prior  attempting with water. Pt states "i am only sponge bath ' Pt educated on LB dressing / bathing. Pt educated on brace and proper wear schedule, positioning and always with a shirt between brace and skin.    OT Diagnosis:    OT Problem List:   OT Treatment Interventions:     OT Goals(current goals can now be found in the care plan section) Acute Rehab OT Goals Patient Stated Goal: to go home OT Goal Formulation: With patient/family Time For Goal Achievement: 10/28/13 Potential to Achieve Goals: Good ADL Goals Pt Will Perform Grooming: with supervision;standing Pt Will Perform Upper Body Bathing: with supervision;sitting Pt Will Perform Lower Body Bathing: with supervision;sit to/from stand Pt Will Transfer to Toilet: with supervision;ambulating;bedside commode  Visit Information  Last OT Received On: 10/15/13 Assistance Needed: +1 History of Present Illness: Pt is a 72 yo male s/p Lumbar Two-Three, Lumbar Three-Four, Lumbar Four-Five, Lumbar Five-Sacral One posterior lumbar interbody fusion with interbody prosthesis posterior lateral arthrodesis and posterior segmental instrumention.    Subjective Data      Prior Functioning       Cognition  Cognition Arousal/Alertness: Awake/alert Behavior During Therapy: WFL for tasks assessed/performed Overall Cognitive Status: Within Functional Limits for tasks assessed    Mobility  Bed Mobility Bed Mobility: Not assessed Rolling Left: 4: Min assist Left Sidelying to Sit: 4: Min assist Details for Bed Mobility Assistance: Min A to elevate trunk Transfers Transfers: Sit to Stand;Stand to Sit Sit to Stand: 6: Modified independent (Device/Increase time) Stand to Sit: 6: Modified independent (Device/Increase time) Details for Transfer Assistance: A for safety. Cues for safe technique and hand placement    Exercises      Balance     End of  Session OT - End of Session Activity Tolerance: Patient tolerated treatment well Patient left:  in chair;with call bell/phone within reach  GO     Harolyn Rutherford 10/15/2013, 3:26 PM Pager: [phone]

## 2013-10-15 NOTE — Progress Notes (Signed)
Physical Therapy Treatment Patient Details Name: William [name] MRN: [id number] DOB: May 14, 1941 Today's Date: 10/15/2013 Time: 4098-1191 PT Time Calculation (min): 24 min  PT Assessment / Plan / Recommendation  History of Present Illness Pt is a 72 yo male s/p Lumbar Two-Three, Lumbar Three-Four, Lumbar Four-Five, Lumbar Five-Sacral One posterior lumbar interbody fusion with interbody prosthesis posterior lateral arthrodesis and posterior segmental instrumention.   PT Comments   Patient feeling much better today with no complains or episodes of nausea. Able to ambulate in hallway. Needed encouragement to sit up in the chair for a while. Continue with current POC  Follow Up Recommendations        Does the patient have the potential to tolerate intense rehabilitation     Barriers to Discharge        Equipment Recommendations  Rolling walker with 5" wheels    Recommendations for Other Services    Frequency Min 5X/week   Progress towards PT Goals Progress towards PT goals: Progressing toward goals  Plan Current plan remains appropriate    Precautions / Restrictions Precautions Precautions: Back;Fall Precaution Comments: patient reeducated on all precautions Required Braces or Orthoses: Spinal Brace Spinal Brace: Lumbar corset   Pertinent Vitals/Pain    Mobility  Bed Mobility Bed Mobility: Left Sidelying to Sit Rolling Left: 4: Min assist Left Sidelying to Sit: 4: Min assist Details for Bed Mobility Assistance: Min A to elevate trunk Transfers Sit to Stand: 4: Min assist;From bed;With upper extremity assist Stand to Sit: 4: Min assist;To chair/3-in-1;With upper extremity assist Details for Transfer Assistance: A for safety. Cues for safe technique and hand placement Ambulation/Gait Ambulation/Gait Assistance: 4: Min assist Ambulation Distance (Feet): 80 Feet Assistive device: Rolling walker Ambulation/Gait Assistance Details: Cues for posture and use of RW.  Gait  Pattern: Step-to pattern Gait velocity: decreased    Exercises     PT Diagnosis:    PT Problem List:   PT Treatment Interventions:     PT Goals (current goals can now be found in the care plan section)    Visit Information  Last PT Received On: 10/15/13 Assistance Needed: +1 History of Present Illness: Pt is a 72 yo male s/p Lumbar Two-Three, Lumbar Three-Four, Lumbar Four-Five, Lumbar Five-Sacral One posterior lumbar interbody fusion with interbody prosthesis posterior lateral arthrodesis and posterior segmental instrumention.    Subjective Data      Cognition  Cognition Arousal/Alertness: Awake/alert Behavior During Therapy: WFL for tasks assessed/performed Overall Cognitive Status: Within Functional Limits for tasks assessed    Balance     End of Session PT - End of Session Equipment Utilized During Treatment: Gait belt;Back brace Activity Tolerance: Patient tolerated treatment well Patient left: with call bell/phone within reach;in chair Nurse Communication: Mobility status   GP     Fredrich Birks 10/15/2013, 11:49 AM 10/15/2013 Fredrich Birks PTA [phone] pager [phone] office

## 2013-10-16 MED ORDER — OXYCODONE-ACETAMINOPHEN 5-325 MG PO TABS: 1.0000 | ORAL_TABLET | ORAL | Status: DC | PRN

## 2013-10-16 NOTE — Progress Notes (Signed)
Darian with [facility] called for rolling walker; B Wilbur Oakland RN,BSN,MHA

## 2013-10-16 NOTE — Discharge Summary (Signed)
  Physician Discharge Summary  Patient ID: William [name] MRN: [id number] DOB/AGE: 71-12-1940 71 y.o.  Admit date: 10/13/2013 Discharge date: 10/16/2013  Admission Diagnoses:  Discharge Diagnoses:  Active Problems:   * No active hospital problems. *   Discharged Condition: good  Hospital Course: Surgery with 4 level plif. Did well. Marked improvement in pain and gait. Wound hgealing well.Home with specific instructions given.  Consults: None  Significant Diagnostic Studies: none  Treatments: surgery: L  23 L 34 L 45 L5S1 plif  Discharge Exam: Blood pressure 116/75, pulse 116, temperature 98.5 F (36.9 C), temperature source Oral, resp. rate 18, height 5\' 7"  (1.702 m), weight 87.408 kg (192 lb 11.2 oz), SpO2 94.00%. Incision/Wound:clean and dry; no new neuro issues  Disposition:      Medication List    ASK your doctor about these medications       acetaminophen 500 MG tablet  Commonly known as:  TYLENOL  Take 500 mg by mouth every 6 (six) hours as needed for pain.     aspirin EC 325 MG tablet  Take 325 mg by mouth daily.     B-STRESS Caps  Take 1 capsule by mouth daily.     DHEA PO  Take 1 capsule by mouth daily.     docusate sodium 100 MG capsule  Commonly known as:  COLACE  Take 100 mg by mouth daily.     gabapentin 600 MG tablet  Commonly known as:  NEURONTIN  Take 600 mg by mouth 4 (four) times daily - after meals and at bedtime.     Ginger Root 550 MG Caps  Take 550 mg by mouth daily.     L-Arginine 500 MG Caps  Take 500 mg by mouth daily.     multivitamin with minerals Tabs tablet  Take 1 tablet by mouth daily.     omega-3 acid ethyl esters 1 G capsule  Commonly known as:  LOVAZA  Take 1 g by mouth daily.     OVER THE COUNTER MEDICATION  Take 1 tablet by mouth daily. Ginko Biloba     OVER THE COUNTER MEDICATION  Take 1 tablet by mouth daily. Biozinc         At home rest most of the time. Get up 9 or 10 times each day and take a 15  or 20 minute walk. No riding in the car and to your first postoperative appointment. If you have neck surgery you may shower from the chest down starting on the third postoperative day. If you had back surgery he may start showering on the third postoperative day with saran wrap wrapped around your incisional area 3 times. After the shower remove the saran wrap. Take pain medicine as needed and other medications as instructed. Call my office for an appointment.  SignedReinaldo Meeker, MD 10/16/2013, 11:30 AM

## 2013-10-16 NOTE — Progress Notes (Signed)
Physical Therapy Treatment Patient Details Name: William [name] MRN: [id number] DOB: 10-09-1941 Today's Date: 10/16/2013 Time: 1413-1440 PT Time Calculation (min): 27 min  PT Assessment / Plan / Recommendation  History of Present Illness Pt is a 72 yo male s/p Lumbar Two-Three, Lumbar Three-Four, Lumbar Four-Five, Lumbar Five-Sacral One posterior lumbar interbody fusion with interbody prosthesis posterior lateral arthrodesis and posterior segmental instrumention.   PT Comments   Patient able to perform stair negotiation without difficulty, increased ambulation distance and was educated on car transfer technique. Spoke to patient and spouse and length regarding mobility expectations upon discharge.    Follow Up Recommendations  Home health PT;Supervision/Assistance - 24 hour           Equipment Recommendations  Rolling walker with 5" wheels       Frequency Min 5X/week   Progress towards PT Goals Progress towards PT goals: Progressing toward goals  Plan Current plan remains appropriate;Discharge plan needs to be updated    Precautions / Restrictions Precautions Precautions: Back;Fall Precaution Comments: patient reeducated on all precautions Required Braces or Orthoses: Spinal Brace Spinal Brace: Lumbar corset Restrictions Weight Bearing Restrictions: No   Pertinent Vitals/Pain 6/10    Mobility  Bed Mobility Bed Mobility: Left Sidelying to Sit Rolling Left: 5: Supervision Left Sidelying to Sit: 4: Min assist Details for Bed Mobility Assistance: Min A to elevate trunk Transfers Transfers: Sit to Stand;Stand to Sit Sit to Stand: 6: Modified independent (Device/Increase time) Stand to Sit: 6: Modified independent (Device/Increase time) Details for Transfer Assistance: Increased time to perform Ambulation/Gait Ambulation/Gait Assistance: 5: Supervision Ambulation Distance (Feet): 240 Feet Assistive device: Rolling walker Gait Pattern: Step-to pattern Gait velocity:  decreased Stairs: Yes Stairs Assistance: 4: Min assist Stair Management Technique: One rail Right;Forwards Number of Stairs: 2      PT Goals (current goals can now be found in the care plan section) Acute Rehab PT Goals Patient Stated Goal: to go home PT Goal Formulation: With patient/family Time For Goal Achievement: 10/28/13 Potential to Achieve Goals: Good  Visit Information  Last PT Received On: 10/16/13 Assistance Needed: +1 History of Present Illness: Pt is a 72 yo male s/p Lumbar Two-Three, Lumbar Three-Four, Lumbar Four-Five, Lumbar Five-Sacral One posterior lumbar interbody fusion with interbody prosthesis posterior lateral arthrodesis and posterior segmental instrumention.    Subjective Data  Subjective: feeling better Patient Stated Goal: to go home   Cognition  Cognition Arousal/Alertness: Awake/alert Behavior During Therapy: WFL for tasks assessed/performed Overall Cognitive Status: Within Functional Limits for tasks assessed    Balance  Static Sitting Balance Static Sitting - Balance Support: Bilateral upper extremity supported;Feet supported Static Sitting - Level of Assistance: 7: Independent  End of Session PT - End of Session Equipment Utilized During Treatment: Gait belt;Back brace Activity Tolerance: Patient tolerated treatment well Patient left: with call bell/phone within reach;in chair Nurse Communication: Mobility status   GP     Fabio Asa 10/16/2013, 3:25 PM Charlotte Crumb, PT DPT  [phone]

## 2013-10-16 NOTE — Progress Notes (Signed)
Hemovac removed, pt tolerated well. Hemovac site dressed with gauze and tape 10/16.

## 2013-11-05 ENCOUNTER — Other Ambulatory Visit: Payer: Self-pay

## 2013-11-13 ENCOUNTER — Encounter: Payer: Self-pay | Admitting: Family Medicine

## 2013-11-13 ENCOUNTER — Ambulatory Visit (INDEPENDENT_AMBULATORY_CARE_PROVIDER_SITE_OTHER): Payer: Medicare HMO | Admitting: Family Medicine

## 2013-11-13 VITALS — BP 125/77 | HR 73 | Ht 67.0 in | Wt 175.0 lb

## 2013-11-13 DIAGNOSIS — R609 Edema, unspecified: Secondary | ICD-10-CM

## 2013-11-13 DIAGNOSIS — R600 Localized edema: Secondary | ICD-10-CM

## 2013-11-13 NOTE — Patient Instructions (Signed)
Leg swelling - likely due to venous insufficiency, wear compression stockings during the day, elevate your feet as much as possible.   Have the la bwork completed that we talked about at your last visit (thyroid hormone and cholesterol panel). Needs to be fasting. Please make an appointment with the lab.    Venous Stasis and Chronic Venous Insufficiency As people age, the veins located in their legs may weaken and stretch. When veins weaken and lose the ability to pump blood effectively, the condition is called chronic venous insufficiency (CVI) or venous stasis. Almost all veins return blood back to the heart. This happens by:  The force of the heart pumping fresh blood pushes blood back to the heart.  Blood flowing to the heart from the force of gravity. In the deep veins of the legs, blood has to fight gravity and flow upstream back to the heart. Here, the leg muscles contract to pump blood back toward the heart. Vein walls are elastic, and many veins have small valves that only allow blood to flow in one direction. When leg muscles contract, they push inward against the elastic vein walls. This squeezes blood upward, opens the valves, and moves blood toward the heart. When leg muscles relax, the vein wall also relaxes and the valves inside the vein close to prevent blood from flowing backward. This method of pumping blood out of the legs is called the venous pump. CAUSES  The venous pump works best while walking and leg muscles are contracting. But when a person sits or stands, blood pressure in leg veins can build. Deep veins are usually able to withstand short periods of inactivity, but long periods of inactivity (and increased pressure) can stretch, weaken, and damage vein walls. High blood pressure can also stretch and damage vein walls. The veins may no longer be able to pump blood back to the heart. Venous hypertension (high blood pressure inside veins) that lasts over time is a primary cause  of CVI. CVI can also be caused by:   Deep vein thrombosis, a condition where a thrombus (blood clot) blocks blood flow in a vein.  Phlebitis, an inflammation of a superficial vein that causes a blood clot to form. Other risk factors for CVI may include:   Heredity.  Obesity.  Pregnancy.  Sedentary lifestyle.  Smoking.  Jobs requiring long periods of standing or sitting in one place.  Age and gender:  Women in their 4's and 27's and men in their 92's are more prone to developing CVI. SYMPTOMS  Symptoms of CVI may include:   Varicose veins.  Ulceration or skin breakdown.  Lipodermatosclerosis, a condition that affects the skin just above the ankle, usually on the inside surface. Over time the skin becomes brown, smooth, tight and often painful. Those with this condition have a high risk of developing skin ulcers.  Reddened or discolored skin on the leg.  Swelling. DIAGNOSIS  Your caregiver can diagnose CVI after performing a careful medical history and physical examination. To confirm the diagnosis, the following tests may also be ordered:   Duplex ultrasound.  Plethysmography (tests blood flow).  Venograms (x-ray using a special dye). TREATMENT The goals of treatment for CVI are to restore a person to an active life and to minimize pain or disability. Typically, CVI does not pose a serious threat to life or limb, and with proper treatment most people with this condition can continue to lead active lives. In most cases, mild CVI can be treated on  an outpatient basis with simple procedures. Treatment methods include:   Elastic compression socks.  Sclerotherapy, a procedure involving an injection of a material that "dissolves" the damaged veins. Other veins in the network of blood vessels take over the function of the damaged veins.  Vein stripping (an older procedure less commonly used).  Laser Ablation surgery.  Valve repair. HOME CARE INSTRUCTIONS   Elastic  compression socks must be worn every day. They can help with symptoms and lower the chances of the problem getting worse, but they do not cure the problem.  Only take over-the-counter or prescription medicines for pain, discomfort, or fever as directed by your caregiver.  Your caregiver will review your other medications with you. SEEK MEDICAL CARE IF:   You are confused about how to take your medications.  There is redness, swelling, or increasing pain in the affected area.  There is a red streak or line that extends up or down from the affected area.  There is a breakdown or loss of skin in the affected area, even if the breakdown is small.  You develop an unexplained oral temperature above 102 F (38.9 C).  There is an injury to the affected area. SEEK IMMEDIATE MEDICAL CARE IF:   There is an injury and open wound to the affected area.  Pain is not adequately relieved with pain medication prescribed or becomes severe.  An oral temperature above 102 F (38.9 C) develops.  The foot/ankle below the affected area becomes suddenly numb or the area feels weak and hard to move. MAKE SURE YOU:   Understand these instructions.  Will watch your condition.  Will get help right away if you are not doing well or get worse. Document Released: 04/22/2007 Document Revised: 03/10/2012 Document Reviewed: 06/30/2007 [facility] Patient Information 2014 [place], [place].

## 2013-11-16 ENCOUNTER — Other Ambulatory Visit: Payer: Medicare HMO

## 2013-11-16 DIAGNOSIS — R600 Localized edema: Secondary | ICD-10-CM | POA: Insufficient documentation

## 2013-11-16 NOTE — Assessment & Plan Note (Signed)
Suspect dependent edema/venous insufficiency. Recent BMP wnl which suggests against renal cause. Recent CBC wnl which rules out anemia as cause. No cardiac signs/symptoms. -will attempt trial of foot elevation and athletic compression stockings during the day -also due for TSH which has been ordered

## 2013-11-16 NOTE — Progress Notes (Signed)
  Subjective:    Patient ID: William [name], male    DOB: 08/27/1941, 72 y.o.   MRN: [id number]  HPI 72 y/o male presents for medical follow up. He underwent L2-L4 posterior lumbar interbody fusion with pedicle screw fixation one month ago. He reports doing well after surgery, pain in controlled with PRN percocet, he is gradually increasing the distance that he can ambulate, he is having regular follow up with his surgeon, no fevers/chills, still has some residual numbness/weakness that is gradually improving, states that he surgeon took him off all his vitamins/herbal supplements after the surgery  He reports increased lower extremity swelling after the surgery, has had some discoloration in the posterior heel region for the past 1-2 days, does not wear compression stockings, not able to lay flat at night due to recent back surgery, no sob, no chest pain, no PND, sleeps with head elevated due to recent surgery, no skin breakdown   Review of Systems  Constitutional: Negative for fever, chills and fatigue.  Respiratory: Negative for cough, choking and shortness of breath.   Cardiovascular: Positive for leg swelling. Negative for chest pain.  Gastrointestinal: Negative for nausea, vomiting and diarrhea.  Skin: Positive for color change. Negative for rash and wound.       Objective:   Physical Exam Vitals: reviewed Gen: pleasant male, NAD, accompanied by wife Cardiac: RRR, S1 and S2 present, no murmurs, no JVD Resp: CTAB, normal effort Skin: well healed incision over the midline of the back Ext: trace edema in bilateral feet, mild blue discoloration of bilateral heals which is blanchable, 2+ radial and DP pulses MSK: using cane to help with ambulation  Reviewed recent BMP which was wnl.        Assessment & Plan:  Please see problem specific assessment and plan

## 2013-11-19 ENCOUNTER — Other Ambulatory Visit: Payer: Commercial Managed Care - HMO

## 2013-11-19 DIAGNOSIS — E785 Hyperlipidemia, unspecified: Secondary | ICD-10-CM

## 2013-11-19 LAB — LIPID PANEL
Cholesterol: 155 mg/dL (ref 0–200)
Total CHOL/HDL Ratio: 4.1 Ratio
Triglycerides: 77 mg/dL (ref <150)
VLDL: 15 mg/dL (ref 0–40)

## 2013-11-19 NOTE — Progress Notes (Signed)
FLP AND TSH DONE TODAY [facility] 

## 2013-11-20 ENCOUNTER — Encounter: Payer: Self-pay | Admitting: Family Medicine

## 2013-11-20 LAB — TSH: TSH: 1.37 u[IU]/mL (ref 0.350–4.500)

## 2013-12-28 ENCOUNTER — Encounter: Payer: Self-pay | Admitting: Sports Medicine

## 2013-12-28 ENCOUNTER — Ambulatory Visit (INDEPENDENT_AMBULATORY_CARE_PROVIDER_SITE_OTHER): Payer: Medicare HMO | Admitting: Sports Medicine

## 2013-12-28 VITALS — BP 133/73 | HR 93 | Temp 98.2°F | Ht 67.0 in | Wt 169.0 lb

## 2013-12-28 DIAGNOSIS — M48061 Spinal stenosis, lumbar region without neurogenic claudication: Secondary | ICD-10-CM

## 2013-12-28 MED ORDER — TRAMADOL HCL 50 MG PO TABS: 50.0000 mg | ORAL_TABLET | Freq: Three times a day (TID) | ORAL | Status: DC | PRN

## 2013-12-28 NOTE — Assessment & Plan Note (Signed)
Status post L2-S1 laminectomy and fusion.  No red flags today on exam but pain is out of proportion to expected healing process.  Encouraged followup with neurosurgeon but will provide a short course of tramadol; consider opioid-induced hyperesthesia as no true radiculopathy or radiculitis.

## 2013-12-28 NOTE — Progress Notes (Signed)
  William [name] - 72 y.o. male MRN [id number]  Date of birth: 01-Feb-1941  CC, HPI, INTERVAL HISTORY & ROS  William [name] is here today for acute on chronic back pain.    He underwent a L2-S1 laminectomy and fusion on October 14.  He has been followed by Dr. Gerlene Fee who last saw the patient at the end of November and plan to wean off of opioid pain medications.  The patient has cut back significantly on his oxycodone and reports that it never provided significant pain relief in his back but that his lower extremity symptoms that prompted the surgery were improved immediately.  He has been performing some light activity including an elliptical no 0 resistance.  Reports the steam room at the [facility] has provided some relief.    Pt denies some bilateral lower extremity cramping without any radicular symptoms similar to prior surgery.  Denies changes in bowel or bladder habits, muscle weakness or falls associated with back pain.  No fevers, chills, night sweats or weight loss.  The pain has been preventing him from sleeping.  He has been using Tylenol, ibuprofen and use his last oxycodone on 12/24/2013.  His pain has been excruciating since he's been out of pain medication.  History  Past Medical, Surgical, Social, and Family History Reviewed per EMR Medications and Allergies reviewed and all updated if necessary. Objective Findings  VITALS: HR: 93 bpm  BP: 133/73 mmHg  TEMP: 98.2 F (36.8 C) (Oral)  RESP:    HT: 5\' 7"  (170.2 cm)  WT: 169 lb (76.658 kg)  BMI: 26.5   BP Readings from Last 3 Encounters:  12/28/13 133/73  11/13/13 125/77  10/16/13 100/55   Wt Readings from Last 3 Encounters:  12/28/13 169 lb (76.658 kg)  11/13/13 175 lb (79.379 kg)  10/13/13 192 lb 11.2 oz (87.408 kg)     PHYSICAL EXAM: GENERAL:  elderlymale. In  moderate discomfort; no respiratory distress  PSYCH: alert and appropriate, good insight   HNEENT:   CARDIO:   LUNGS:   ABDOMEN:   EXTREM:  Warm, well perfused.   Moves all 4 extremities spontaneously; no lateralization.  No noted foot lesions.  Distal pulses trace.  1+/4 pretibial edema. Back Exam: Appear:  well-healed surgical scar with hyperesthesia, no erythema, no fluctuance   Palp:  diffuse hyperesthesia bilateral muscle spasm   ROM:  hip and knee range of motion full   NV:   lower extremity myotome 5/5 diffusely Lower extremity dermatomes intact to light touch  Testing:  bilateral negative straight leg raise     GU:   SKIN:     Assessment & Plan   Problems addressed today: General Plan & Pt Instructions:  1. Spinal stenosis of lumbar region       Follow up with your Back Surgeon  Try Tramadol for a short period; this medication is not intended to be permanent but may provide you relief      For further discussion of A/P and for follow up issues see problem based charting if applicable.

## 2013-12-28 NOTE — Patient Instructions (Signed)
   Follow up with your Back Surgeon  Try Tramadol for a short period; this medication is not intended to be permanent but may provide you relief    If you need anything prior to your next visit please call the clinic. Please Bring all medications or accurate medication list with you to each appointment; an accurate medication list is essential in providing you the best care possible.

## 2014-08-03 ENCOUNTER — Ambulatory Visit (INDEPENDENT_AMBULATORY_CARE_PROVIDER_SITE_OTHER): Payer: Commercial Managed Care - HMO | Admitting: Family Medicine

## 2014-08-03 ENCOUNTER — Encounter: Payer: Self-pay | Admitting: Family Medicine

## 2014-08-03 VITALS — BP 133/75 | HR 69 | Temp 97.1°F | Ht 67.0 in | Wt 169.0 lb

## 2014-08-03 DIAGNOSIS — L218 Other seborrheic dermatitis: Secondary | ICD-10-CM

## 2014-08-03 DIAGNOSIS — T07XXXA Unspecified multiple injuries, initial encounter: Secondary | ICD-10-CM

## 2014-08-03 DIAGNOSIS — L570 Actinic keratosis: Secondary | ICD-10-CM

## 2014-08-03 DIAGNOSIS — B351 Tinea unguium: Secondary | ICD-10-CM

## 2014-08-03 DIAGNOSIS — L219 Seborrheic dermatitis, unspecified: Secondary | ICD-10-CM

## 2014-08-03 DIAGNOSIS — T148XXA Other injury of unspecified body region, initial encounter: Secondary | ICD-10-CM

## 2014-08-03 LAB — CBC WITH DIFFERENTIAL/PLATELET
BASOS ABS: 0 10*3/uL (ref 0.0–0.1)
BASOS PCT: 0 % (ref 0–1)
Eosinophils Absolute: 0.5 10*3/uL (ref 0.0–0.7)
Eosinophils Relative: 9 % — ABNORMAL HIGH (ref 0–5)
HCT: 41.4 % (ref 39.0–52.0)
Hemoglobin: 13.7 g/dL (ref 13.0–17.0)
LYMPHS PCT: 35 % (ref 12–46)
Lymphs Abs: 2.1 10*3/uL (ref 0.7–4.0)
MCH: 28.4 pg (ref 26.0–34.0)
MCHC: 33.1 g/dL (ref 30.0–36.0)
MCV: 85.7 fL (ref 78.0–100.0)
MONO ABS: 0.5 10*3/uL (ref 0.1–1.0)
Monocytes Relative: 8 % (ref 3–12)
NEUTROS ABS: 2.8 10*3/uL (ref 1.7–7.7)
NEUTROS PCT: 48 % (ref 43–77)
Platelets: 298 10*3/uL (ref 150–400)
RBC: 4.83 MIL/uL (ref 4.22–5.81)
RDW: 15.4 % (ref 11.5–15.5)
WBC: 5.9 10*3/uL (ref 4.0–10.5)

## 2014-08-03 LAB — CMP AND LIVER
ALBUMIN: 3.9 g/dL (ref 3.5–5.2)
ALK PHOS: 73 U/L (ref 39–117)
ALT: 12 U/L (ref 0–53)
AST: 25 U/L (ref 0–37)
BUN: 15 mg/dL (ref 6–23)
Bilirubin, Direct: 0.1 mg/dL (ref 0.0–0.3)
CALCIUM: 8.7 mg/dL (ref 8.4–10.5)
CHLORIDE: 102 meq/L (ref 96–112)
CO2: 28 mEq/L (ref 19–32)
Creat: 0.74 mg/dL (ref 0.50–1.35)
Glucose, Bld: 97 mg/dL (ref 70–99)
Indirect Bilirubin: 0.5 mg/dL (ref 0.2–1.2)
POTASSIUM: 4.3 meq/L (ref 3.5–5.3)
SODIUM: 137 meq/L (ref 135–145)
TOTAL PROTEIN: 7 g/dL (ref 6.0–8.3)
Total Bilirubin: 0.6 mg/dL (ref 0.2–1.2)

## 2014-08-03 MED ORDER — KETOCONAZOLE 1 % EX SHAM
MEDICATED_SHAMPOO | CUTANEOUS | Status: DC
Start: 2014-08-03 — End: 2014-11-08

## 2014-08-03 MED ORDER — TERBINAFINE HCL 250 MG PO TABS: 250.0000 mg | ORAL_TABLET | Freq: Every day | ORAL | Status: DC

## 2014-08-03 NOTE — Patient Instructions (Signed)
Bruises - suspect due to aspirin, decreased dose to 81 mg daily, check lab work  Dandruff - suspect due to seborrheic dermatitis, start Ketoconazole shampoo  Toe fungus - start antifungal pill  Actinic Keratosis - rough areas on face, return for cryotherapy/freezing of face

## 2014-08-03 NOTE — Progress Notes (Signed)
   Subjective:    Patient ID: William [name], male    DOB: 01-18-1941, 73 y.o.   MRN: [id number]  HPI 74 y/o male presents for routine follow up.   Bruises - multiple small round bruises on upper arms, have been intermittently present for the past year, takes ASA 325 mg daily, denies trauma to the area, no blood in stool, no other bleeding  Onchyomycosis - both great toes nails and has spread to other toes, no relief with otc medications  Dandruff - on the scalp, using T-gel shampoo with no relief, color change to scalp  Back pain - underwent back surgery last year, pain currently 2/10, not taking any pain medications, exercising regularly (combinations of weights and cardio).  Social - former smoker   Review of Systems  Constitutional: Negative for fever, chills and fatigue.  Respiratory: Negative for shortness of breath.   Gastrointestinal: Negative for nausea, vomiting and diarrhea.  Skin: Positive for color change.       Objective:   Physical Exam Vitals: reviewed Gen: pleasant male, NAD HEENT: normocephalic, PERRL, EOMI, MMM Cardiac: RRR, S1 and S2, no murmurs, no heaves/thrills Resp: CTAB, normal effort Skin: areas of hypopigmentation on scalp, seborrhea present, 2 AK's left temple, 1 AK right temple, oncyomycosis of all toenails, multiple small bruises of upper arms      Assessment & Plan:  Please see problem specific assessment and plan.

## 2014-08-04 ENCOUNTER — Encounter: Payer: Self-pay | Admitting: Family Medicine

## 2014-08-04 DIAGNOSIS — T148XXA Other injury of unspecified body region, initial encounter: Secondary | ICD-10-CM | POA: Insufficient documentation

## 2014-08-04 DIAGNOSIS — B351 Tinea unguium: Secondary | ICD-10-CM | POA: Insufficient documentation

## 2014-08-04 DIAGNOSIS — L570 Actinic keratosis: Secondary | ICD-10-CM | POA: Insufficient documentation

## 2014-08-04 DIAGNOSIS — L219 Seborrheic dermatitis, unspecified: Secondary | ICD-10-CM | POA: Insufficient documentation

## 2014-08-04 NOTE — Assessment & Plan Note (Signed)
Superficial bruising of arms. Suspect due to aspirin use. -decrease aspirin to 81 mg daily -check CBC

## 2014-08-04 NOTE — Assessment & Plan Note (Signed)
2 AK left temple. 1 AK right temple -unable to perform cryotherapy as out of liquid nitrogen in office -will perform cryotherapy at next visit

## 2014-08-04 NOTE — Assessment & Plan Note (Signed)
Skin changes of scalp consistent with seborrheic dermatitis -attempt trial of ketoconazole shampoo

## 2014-08-04 NOTE — Assessment & Plan Note (Signed)
Bilateral foot.  -check LFT's -Start Lamisil for 3 months

## 2014-11-08 ENCOUNTER — Other Ambulatory Visit: Payer: Self-pay | Admitting: *Deleted

## 2014-11-08 ENCOUNTER — Encounter: Payer: Self-pay | Admitting: Family Medicine

## 2014-11-08 MED ORDER — KETOCONAZOLE 1 % EX SHAM: MEDICATED_SHAMPOO | CUTANEOUS | Status: DC

## 2014-11-08 NOTE — Progress Notes (Unsigned)
Patient is needing a refill of Terbinafine called in to [facility] on [address].  He also is needing shampoo prescription called in.

## 2014-11-12 ENCOUNTER — Telehealth: Payer: Self-pay | Admitting: *Deleted

## 2014-11-12 NOTE — Telephone Encounter (Signed)
[facility] pharmacy calling regarding ketoconazole 1% (OTC) not available.  Has been trying to get it from manufacturer x 2 days.  Will not have med x 1 week.  Wants to know if ok to change to ketoconazole 2% and give 240 mL instead of 200 mL.  OK per Dr. Ree Kida and pharmacy informed.  Burna Forts, BSN, RN-BC

## 2014-11-29 ENCOUNTER — Ambulatory Visit: Payer: Commercial Managed Care - HMO | Admitting: Family Medicine

## 2014-12-13 ENCOUNTER — Ambulatory Visit (INDEPENDENT_AMBULATORY_CARE_PROVIDER_SITE_OTHER): Payer: Commercial Managed Care - HMO | Admitting: Family Medicine

## 2014-12-13 ENCOUNTER — Encounter: Payer: Self-pay | Admitting: Family Medicine

## 2014-12-13 VITALS — BP 153/86 | HR 67 | Temp 97.7°F | Ht 67.0 in | Wt 176.0 lb

## 2014-12-13 DIAGNOSIS — M7701 Medial epicondylitis, right elbow: Secondary | ICD-10-CM

## 2014-12-13 DIAGNOSIS — L219 Seborrheic dermatitis, unspecified: Secondary | ICD-10-CM

## 2014-12-13 DIAGNOSIS — Z1211 Encounter for screening for malignant neoplasm of colon: Secondary | ICD-10-CM

## 2014-12-13 DIAGNOSIS — L570 Actinic keratosis: Secondary | ICD-10-CM

## 2014-12-13 DIAGNOSIS — L218 Other seborrheic dermatitis: Secondary | ICD-10-CM

## 2014-12-13 DIAGNOSIS — B351 Tinea unguium: Secondary | ICD-10-CM

## 2014-12-13 DIAGNOSIS — M25532 Pain in left wrist: Secondary | ICD-10-CM | POA: Insufficient documentation

## 2014-12-13 HISTORY — DX: Medial epicondylitis, right elbow: M77.01

## 2014-12-13 NOTE — Patient Instructions (Addendum)
Toenail fungus - stop Terbinifine, see if nail beds grow out  Dandruff - start to use T-gel daily, continue Ketoconazole shampoo twice weekly  Left wrist pain/medial epicondylitis - may take ibuprofen 400 mg BID for the next week, apply ice to the affected areas twice daily for 15 minutes at at time  Actinic Keratosis (rough patches on skin) - return if not resolved in 2 weeks  Stool test - complete stool test and return to lab  Medial Epicondylitis (Golfer's Elbow) with Rehab Medial epicondylitis involves inflammation and pain around the inner (medial) portion of the elbow. This pain is caused by inflammation of the tendons in the forearm that flex (bring down) the wrist. Medial epicondylitis is also called golfer's elbow, because it is common among golfers. However, it may occur in any individual who flexes the wrist regularly. If medial epicondylitis is left untreated, it may become a chronic problem. SYMPTOMS   Pain, tenderness, or inflammation over the inner (medial) side of the elbow.  Pain or weakness with gripping activities.  Pain that increases with wrist twisting motions (using a screwdriver, playing golf, bowling). CAUSES  Medial epicondylitis is caused by inflammation of the tendons that flex the wrist. Causes of injury may include:  Chronic, repetitive stress and strain to the tendons that run from the wrist and forearm to the elbow.  Sudden strain on the forearm, including wrist snap when serving balls with racquet sports, or throwing a baseball. RISK INCREASES WITH:  Sports or occupations that require repetitive and/or strenuous forearm and wrist movements (pitching a baseball, golfing, carpentry).  Poor wrist and forearm strength and flexibility.  Failure to warm up properly before activity.  Resuming activity before healing, rehabilitation, and conditioning are complete. PREVENTION   Warm up and stretch properly before activity.  Maintain physical  fitness:  Strength, flexibility, and endurance.  Cardiovascular fitness.  Wear and use properly fitted equipment.  Learn and use proper technique and have a coach correct improper technique.  Wear a tennis elbow (counterforce) brace. PROGNOSIS  The course of this condition depends on the degree of the injury. If treated properly, acute cases (symptoms lasting less than 4 weeks) are often resolved in 2 to 6 weeks. Chronic (longer lasting cases) often resolve in 3 to 6 months, but may require physical therapy. RELATED COMPLICATIONS   Frequently recurring symptoms, resulting in a chronic problem. Properly treating the problem the first time decreases frequency of recurrence.  Chronic inflammation, scarring, and partial tendon tear, requiring surgery.  Delayed healing or resolution of symptoms. TREATMENT  Treatment first involves the use of ice and medicine, to reduce pain and inflammation. Strengthening and stretching exercises may reduce discomfort, if performed regularly. These exercises may be performed at home, if the condition is an acute injury. Chronic cases may require a referral to a physical therapist for evaluation and treatment. Your caregiver may advise a corticosteroid injection to help reduce inflammation. Rarely, surgery is needed. MEDICATION  If pain medicine is needed, nonsteroidal anti-inflammatory medicines (aspirin and ibuprofen), or other minor pain relievers (acetaminophen), are often advised.  Do not take pain medicine for 7 days before surgery.  Prescription pain relievers may be given, if your caregiver thinks they are needed. Use only as directed and only as much as you need.  Corticosteroid injections may be recommended. These injections should be reserved only for the most severe cases, because they can only be given a certain number of times. HEAT AND COLD  Cold treatment (icing) should be  applied for 10 to 15 minutes every 2 to 3 hours for inflammation and  pain, and immediately after activity that aggravates your symptoms. Use ice packs or an ice massage.  Heat treatment may be used before performing stretching and strengthening activities prescribed by your caregiver, physical therapist, or athletic trainer. Use a heat pack or a warm water soak. SEEK MEDICAL CARE IF: Symptoms get worse or do not improve in 2 weeks, despite treatment. EXERCISES  RANGE OF MOTION (ROM) AND STRETCHING EXERCISES - Epicondylitis, Medial (Golfer's Elbow) These exercises may help you when beginning to rehabilitate your injury. Your symptoms may go away with or without further involvement from your physician, physical therapist or athletic trainer. While completing these exercises, remember:   Restoring tissue flexibility helps normal motion to return to the joints. This allows healthier, less painful movement and activity.  An effective stretch should be held for at least 30 seconds.  A stretch should never be painful. You should only feel a gentle lengthening or release in the stretched tissue. RANGE OF MOTION - Wrist Flexion, Active-Assisted  Extend your right / left elbow with your fingers pointing down.*  Gently pull the back of your hand towards you, until you feel a gentle stretch on the top of your forearm.  Hold this position for __________ seconds. Repeat __________ times. Complete this exercise __________ times per day.  *If directed by your physician, physical therapist or athletic trainer, complete this stretch with your elbow bent, rather than extended. RANGE OF MOTION - Wrist Extension, Active-Assisted  Extend your right / left elbow and turn your palm upwards.*  Gently pull your palm and fingertips back, so your wrist extends and your fingers point more toward the ground.  You should feel a gentle stretch on the inside of your forearm.  Hold this position for __________ seconds. Repeat __________ times. Complete this exercise __________ times  per day. *If directed by your physician, physical therapist or athletic trainer, complete this stretch with your elbow bent, rather than extended. STRETCH - Wrist Extension   Place your right / left fingertips on a tabletop leaving your elbow slightly bent. Your fingers should point backwards.  Gently press your fingers and palm down onto the table, by straightening your elbow. You should feel a stretch on the inside of your forearm.  Hold this position for __________ seconds. Repeat __________ times. Complete this stretch __________ times per day.  STRENGTHENING EXERCISES - Epicondylitis, Medial (Golfer's Elbow) These exercises may help you when beginning to rehabilitate your injury. They may resolve your symptoms with or without further involvement from your physician, physical therapist or athletic trainer. While completing these exercises, remember:   Muscles can gain both the endurance and the strength needed for everyday activities through controlled exercises.  Complete these exercises as instructed by your physician, physical therapist or athletic trainer. Increase the resistance and repetitions only as guided.  You may experience muscle soreness or fatigue, but the pain or discomfort you are trying to eliminate should never worsen during these exercises. If this pain does get worse, stop and make sure you are following the directions exactly. If the pain is still present after adjustments, discontinue the exercise until you can discuss the trouble with your caregiver. STRENGTH - Wrist Flexors  Sit with your right / left forearm palm-up, and fully supported on a table or countertop. Your elbow should be resting below the height of your shoulder. Allow your wrist to extend over the edge of the surface.  Loosely holding a __________ weight, or a piece of rubber exercise band or tubing, slowly curl your hand up toward your forearm.  Hold this position for __________ seconds. Slowly lower  the wrist back to the starting position in a controlled manner. Repeat __________ times. Complete this exercise __________ times per day.  STRENGTH - Wrist Extensors  Sit with your right / left forearm palm-down and fully supported. Your elbow should be resting below the height of your shoulder. Allow your wrist to extend over the edge of the surface.  Loosely holding a __________ weight, or a piece of rubber exercise band or tubing, slowly curl your hand up toward your forearm.  Hold this position for __________ seconds. Slowly lower the wrist back to the starting position in a controlled manner. Repeat __________ times. Complete this exercise __________ times per day.  STRENGTH - Ulnar Deviators  Stand with a ____________________ weight in your right / left hand, or sit while holding a rubber exercise band or tubing, with your healthy arm supported on a table or countertop.  Move your wrist so that your pinkie travels toward your forearm and your thumb moves away from your forearm.  Hold this position for __________ seconds and then slowly lower the wrist back to the starting position. Repeat __________ times. Complete this exercise __________ times per day STRENGTH - Grip   Grasp a tennis ball, a dense sponge, or a large, rolled sock in your hand.  Squeeze as hard as you can, without increasing any pain.  Hold this position for __________ seconds. Release your grip slowly. Repeat __________ times. Complete this exercise __________ times per day.  STRENGTH - Forearm Supinators   Sit with your right / left forearm supported on a table, keeping your elbow below shoulder height. Rest your hand over the edge, palm down.  Gently grip a hammer or a soup ladle.  Without moving your elbow, slowly turn your palm and hand upward to a "thumbs-up" position.  Hold this position for __________ seconds. Slowly return to the starting position. Repeat __________ times. Complete this exercise  __________ times per day.  STRENGTH - Forearm Pronators  Sit with your right / left forearm supported on a table, keeping your elbow below shoulder height. Rest your hand over the edge, palm up.  Gently grip a hammer or a soup ladle.  Without moving your elbow, slowly turn your palm and hand upward to a "thumbs-up" position.  Hold this position for __________ seconds. Slowly return to the starting position. Repeat __________ times. Complete this exercise __________ times per day.  Document Released: 12/17/2005 Document Revised: 03/10/2012 Document Reviewed: 03/31/2009 [facility] Patient Information 2015 [place], [place]. This information is not intended to replace advice given to you by your health care provider. Make sure you discuss any questions you have with your health care provider.

## 2014-12-13 NOTE — Assessment & Plan Note (Signed)
Patient continues to have very mild seborrheic dermatitis of scalp (few areas may actually be AK's, see note on cryotherapy of scalp AK) -continue ketoconazole shampoo twice weekly -start daily use with T-gel shampoo

## 2014-12-13 NOTE — Assessment & Plan Note (Signed)
Cryotherapy performed on 6 AK (2 on top of scalp, 1 right temple, 2 left temple, 1 left cheek) -patient tolerated well -return if AK persists -discussed avoiding sun exposure

## 2014-12-13 NOTE — Assessment & Plan Note (Signed)
One week history of left wrist pain. Exam unremarkable, consistent with MSK etiology. -attempt trial of ice and NSAID's -return if symptoms persist

## 2014-12-13 NOTE — Progress Notes (Signed)
   Subjective:    Patient ID: William [name], male    DOB: [birth year], 73 y.o.   MRN: [id number]  HPI 73 y/o male presents for routine follow up.  Toenail fungus - completed 3 months of Terbinifine, has seen some improvement at base of nails, distal aspect of all 10 toenails still yellow  AK - patient returns for cryotherapy of multiple AK's on face  Seborrheic Dermatitis - patient reports that he still hs dandruff on top of scalp, some improvement with Ketoconazole shampoo, also has occasional scaling of ears  Left wrist pain - one week history of left wrist/hand pain, occurred after raking leaves, has attempted ibuprofen at night which does relieve pain   Right elbow pain - medial elbow pain present for the past few month, denies injury, he is able to lift weight without discomfort  Colon Cancer screening - patient is due for repeat FOBT x3  Social - former smoker   Review of Systems  Constitutional: Negative for fever, chills and fatigue.  Respiratory: Negative for cough and shortness of breath.   Cardiovascular: Negative for chest pain.  Gastrointestinal: Negative for nausea, vomiting and diarrhea.  Skin: Positive for rash.       Objective:   Physical Exam Vitals: reviewed Gen: pleasant male, NAD Cardiac: RRR, S1 and S2 present, no murmurs, no heaves/thrills Resp: CTAB, normal effort Skin: scaling/dandruff present on scalp (two areas on top of scalp could represent AK's), multiple AK's on face (1 of right temple, 1 left cheek, 2 left temple); yellowing consistent with toenail fungus of all 10 toes (mostly at distal aspects, bases clear) MSK: left wrist - no point tenderness, ROM full and without pain; right elbow - tenderness over medial epicondyle, no pain with elbow flexion/extension or wrist flexion/extension  Procedure Note: Risks/benefits of cryotherapy discussed, verbal consent obtained, used Nitrogen gun to freeze 6 AK (2 on top of scalp, 1 on left temple, 1 on left  cheek, 2 left temple), 2 freeze/thaw cycles per each lesion, patient tolerated procedure well    Assessment & Plan:  Please see problem specific assessment and plan.

## 2014-12-13 NOTE — Assessment & Plan Note (Signed)
Patient presents with right elbow pain consistent with medial epicondylitis -trial of ibuprofen and icing -stretching exercises given

## 2014-12-13 NOTE — Assessment & Plan Note (Signed)
Improved with course of Terbinifine but not fully resolved -will stop Terbinifine and monitor for resolution

## 2014-12-22 LAB — POC HEMOCCULT BLD/STL (HOME/3-CARD/SCREEN)

## 2014-12-22 NOTE — Addendum Note (Signed)
Addended by: [place] Pink on: 12/22/2014 04:04 PM   Modules accepted: Orders

## 2014-12-24 ENCOUNTER — Encounter (HOSPITAL_COMMUNITY): Payer: Self-pay | Admitting: Emergency Medicine

## 2014-12-24 ENCOUNTER — Emergency Department (HOSPITAL_COMMUNITY): Payer: Commercial Managed Care - HMO

## 2014-12-24 ENCOUNTER — Emergency Department (HOSPITAL_COMMUNITY)
Admission: EM | Admit: 2014-12-24 | Discharge: 2014-12-24 | Disposition: A | Discharge status: Home / Self Care | Payer: Commercial Managed Care - HMO | Attending: Emergency Medicine | Admitting: Emergency Medicine

## 2014-12-24 DIAGNOSIS — M545 Low back pain, unspecified: Secondary | ICD-10-CM

## 2014-12-24 DIAGNOSIS — Z79899 Other long term (current) drug therapy: Secondary | ICD-10-CM | POA: Insufficient documentation

## 2014-12-24 DIAGNOSIS — S39012A Strain of muscle, fascia and tendon of lower back, initial encounter: Secondary | ICD-10-CM | POA: Diagnosis not present

## 2014-12-24 DIAGNOSIS — Z7982 Long term (current) use of aspirin: Secondary | ICD-10-CM | POA: Diagnosis not present

## 2014-12-24 DIAGNOSIS — Y9289 Other specified places as the place of occurrence of the external cause: Secondary | ICD-10-CM | POA: Insufficient documentation

## 2014-12-24 DIAGNOSIS — X58XXXA Exposure to other specified factors, initial encounter: Secondary | ICD-10-CM | POA: Insufficient documentation

## 2014-12-24 DIAGNOSIS — Z87891 Personal history of nicotine dependence: Secondary | ICD-10-CM | POA: Diagnosis not present

## 2014-12-24 DIAGNOSIS — Z8639 Personal history of other endocrine, nutritional and metabolic disease: Secondary | ICD-10-CM | POA: Insufficient documentation

## 2014-12-24 DIAGNOSIS — Y998 Other external cause status: Secondary | ICD-10-CM | POA: Diagnosis not present

## 2014-12-24 DIAGNOSIS — Z9889 Other specified postprocedural states: Secondary | ICD-10-CM | POA: Diagnosis not present

## 2014-12-24 DIAGNOSIS — Y93B3 Activity, free weights: Secondary | ICD-10-CM | POA: Insufficient documentation

## 2014-12-24 DIAGNOSIS — M25552 Pain in left hip: Secondary | ICD-10-CM | POA: Diagnosis present

## 2014-12-24 LAB — URINALYSIS, ROUTINE W REFLEX MICROSCOPIC
BILIRUBIN URINE: NEGATIVE
Glucose, UA: NEGATIVE mg/dL
Hgb urine dipstick: NEGATIVE
Ketones, ur: 15 mg/dL — AB
Leukocytes, UA: NEGATIVE
Nitrite: NEGATIVE
PROTEIN: NEGATIVE mg/dL
Specific Gravity, Urine: 1.037 — ABNORMAL HIGH (ref 1.005–1.030)
UROBILINOGEN UA: 1 mg/dL (ref 0.0–1.0)
pH: 5 (ref 5.0–8.0)

## 2014-12-24 LAB — CBC WITH DIFFERENTIAL/PLATELET
BASOS ABS: 0 10*3/uL (ref 0.0–0.1)
Basophils Relative: 0 % (ref 0–1)
EOS ABS: 0.1 10*3/uL (ref 0.0–0.7)
EOS PCT: 1 % (ref 0–5)
HCT: 41.9 % (ref 39.0–52.0)
Hemoglobin: 13.9 g/dL (ref 13.0–17.0)
Lymphocytes Relative: 24 % (ref 12–46)
Lymphs Abs: 1.7 10*3/uL (ref 0.7–4.0)
MCH: 30.2 pg (ref 26.0–34.0)
MCHC: 33.2 g/dL (ref 30.0–36.0)
MCV: 90.9 fL (ref 78.0–100.0)
Monocytes Absolute: 0.8 10*3/uL (ref 0.1–1.0)
Monocytes Relative: 11 % (ref 3–12)
Neutro Abs: 4.6 10*3/uL (ref 1.7–7.7)
Neutrophils Relative %: 64 % (ref 43–77)
PLATELETS: 294 10*3/uL (ref 150–400)
RBC: 4.61 MIL/uL (ref 4.22–5.81)
RDW: 13.5 % (ref 11.5–15.5)
WBC: 7.1 10*3/uL (ref 4.0–10.5)

## 2014-12-24 LAB — COMPREHENSIVE METABOLIC PANEL
ALT: 15 U/L (ref 0–53)
AST: 24 U/L (ref 0–37)
Albumin: 3.7 g/dL (ref 3.5–5.2)
Alkaline Phosphatase: 69 U/L (ref 39–117)
Anion gap: 8 (ref 5–15)
BUN: 13 mg/dL (ref 6–23)
CALCIUM: 9.4 mg/dL (ref 8.4–10.5)
CO2: 29 mmol/L (ref 19–32)
Chloride: 101 mEq/L (ref 96–112)
Creatinine, Ser: 0.79 mg/dL (ref 0.50–1.35)
GFR, EST NON AFRICAN AMERICAN: 88 mL/min — AB (ref >90)
GLUCOSE: 95 mg/dL (ref 70–99)
Potassium: 4.5 mmol/L (ref 3.5–5.1)
SODIUM: 138 mmol/L (ref 135–145)
TOTAL PROTEIN: 7.8 g/dL (ref 6.0–8.3)
Total Bilirubin: 0.6 mg/dL (ref 0.3–1.2)

## 2014-12-24 MED ORDER — OXYCODONE-ACETAMINOPHEN 5-325 MG PO TABS: 1.0000 | ORAL_TABLET | Freq: Four times a day (QID) | ORAL | Status: DC | PRN

## 2014-12-24 MED ORDER — OXYCODONE-ACETAMINOPHEN 5-325 MG PO TABS
1.0000 | ORAL_TABLET | Freq: Once | ORAL | Status: AC
  Administered 2014-12-24: 1 via ORAL
  Filled 2014-12-24: qty 1

## 2014-12-24 MED ORDER — DOCUSATE SODIUM 100 MG PO CAPS: 100.0000 mg | ORAL_CAPSULE | Freq: Two times a day (BID) | ORAL | Status: DC

## 2014-12-24 MED ORDER — CYCLOBENZAPRINE HCL 10 MG PO TABS: 5.0000 mg | ORAL_TABLET | Freq: Two times a day (BID) | ORAL | Status: DC | PRN

## 2014-12-24 NOTE — ED Provider Notes (Signed)
CSN: [id number]     Arrival date & time 12/24/14  4431 History   First MD Initiated Contact with Patient 12/24/14 [phone]     Chief Complaint  Patient presents with  . Hip Pain     (Consider location/radiation/quality/duration/timing/severity/associated sxs/prior Treatment) Patient is a 73 y.o. male presenting with back pain. The history is provided by the patient.  Back Pain Location:  Lumbar spine Quality:  Aching Radiates to:  Does not radiate Pain severity:  Moderate Pain is:  Same all the time Onset quality:  Gradual Duration:  4 days Timing:  Constant Progression:  Worsening Chronicity:  New Context comment:  Dead lifting on 27-Apr-2023. Relieved by: nsaids help some. Worsened by:  Bending and twisting Ineffective treatments:  NSAIDs Associated symptoms: no dysuria, no fever and no numbness     Past Medical History  Diagnosis Date  . Dyslipidemia (high LDL; low HDL)   . Numbness and tingling of right leg    Past Surgical History  Procedure Laterality Date  . Lumbar disc surgery  09/2007    bone spurs, stenosis, L3-4, 4-5  . Knee arthroscopy w/ meniscal repair  2008    left  . Knee arthroscopy w/ meniscal repair  11/2011    left  . Laceration repair  1962    forehead  . Small intestine surgery  8676    ileitis complications  . Bone chip removed from right heel  1998  . Finger amputation      injured by table saw, left 4th and 5th fingers  . Skin graft      from right thigh to left forehead  . Posterior lumbar fusion 4 level N/A 10/13/2013    Procedure: Lumbar Two-Three, Lumbar Three-Four, Lumbar Four-Five, Lumbar Five-Sacral One posterior lumbar interbody fusion with interbody prosthesis posterior lateral arthrodesis and posterior segmental instrumentation;  Surgeon: William Ghee, MD;  Location: [facility];  Service: Neurosurgery;  Laterality: N/A;  POSTERIOR LUMBAR FUSION 4 LEVEL   Family History  Problem Relation Age of Onset  . Heart disease Father   .  Cancer Maternal Grandfather   . Cancer Maternal Grandmother    History  Substance Use Topics  . Smoking status: Former Smoker -- 1.50 packs/day for 30 years    Quit date: 04/02/1986  . Smokeless tobacco: Never Used  . Alcohol Use: 0.0 oz/week     Comment: Rare beer    Review of Systems  Constitutional: Negative for fever.  HENT: Negative for drooling and rhinorrhea.   Eyes: Negative for pain.  Respiratory: Negative for cough.   Cardiovascular: Negative for leg swelling.  Gastrointestinal: Negative for nausea, vomiting and diarrhea.  Genitourinary: Negative for dysuria and hematuria.  Musculoskeletal: Positive for back pain. Negative for gait problem and neck pain.  Skin: Negative for color change.  Neurological: Negative for numbness.  Hematological: Negative for adenopathy.  Psychiatric/Behavioral: Negative for behavioral problems.  All other systems reviewed and are negative.     Allergies  Hydrocodone  Home Medications   Prior to Admission medications   Medication Sig Start Date End Date Taking? Authorizing Provider  aspirin 325 MG tablet Take 325 mg by mouth daily.    Historical Provider, MD  docusate sodium (COLACE) 100 MG capsule Take 100 mg by mouth daily.     Historical Provider, MD  KETOCONAZOLE, TOPICAL, 1 % SHAM Wash your scalp/hair daily with shampoo 11/08/14   Lupita Dawn, MD  Multiple Vitamin (MULTIVITAMIN WITH MINERALS) TABS tablet Take 1 tablet  by mouth daily.    Historical Provider, MD  Omega-3 Fatty Acids (FISH OIL CONCENTRATE PO) Take 1 tablet by mouth.    Historical Provider, MD   BP 94/68 mmHg  Pulse 76  Temp(Src) 97.7 F (36.5 C) (Oral)  Resp 16  Ht 5\' 7"  (1.702 m)  Wt 173 lb (78.472 kg)  BMI 27.09 kg/m2  SpO2 96% Physical Exam  Constitutional: He is oriented to person, place, and time. He appears well-developed and well-nourished.  HENT:  Head: Normocephalic and atraumatic.  Right Ear: External ear normal.  Left Ear: External ear  normal.  Nose: Nose normal.  Mouth/Throat: Oropharynx is clear and moist. No oropharyngeal exudate.  Eyes: Conjunctivae and EOM are normal. Pupils are equal, round, and reactive to light.  Neck: Normal range of motion. Neck supple.  Cardiovascular: Normal rate, regular rhythm, normal heart sounds and intact distal pulses.  Exam reveals no gallop and no friction rub.   No murmur heard. Pulmonary/Chest: Effort normal and breath sounds normal. No respiratory distress. He has no wheezes.  Abdominal: Soft. Bowel sounds are normal. He exhibits no distension. There is no tenderness. There is no rebound and no guarding.  Musculoskeletal: Normal range of motion. He exhibits tenderness. He exhibits no edema.  No vertebral tenderness. Moderate tenderness to palpation of the left lower lumbar paraspinal area and pelvic rim.   Normal strength and sensation in bilateral lower extremities. 2+ distal pulses in bilateral lower extremities.  The patient has reproduction of his symptoms with twisting of the torso and bending.  Neurological: He is alert and oriented to person, place, and time.  Skin: Skin is warm and dry.  Psychiatric: He has a normal mood and affect. His behavior is normal.  Nursing note and vitals reviewed.   ED Course  Procedures (including critical care time) Labs Review Labs Reviewed  COMPREHENSIVE METABOLIC PANEL - Abnormal; Notable for the following:    GFR calc non Af Amer 88 (*)    All other components within normal limits  URINALYSIS, ROUTINE W REFLEX MICROSCOPIC - Abnormal; Notable for the following:    Color, Urine AMBER (*)    APPearance CLOUDY (*)    Specific Gravity, Urine 1.037 (*)    Ketones, ur 15 (*)    All other components within normal limits  CBC WITH DIFFERENTIAL    Imaging Review Dg Lumbar Spine Complete  12/24/2014   CLINICAL DATA:  Low back pain for 4 days, worsening. History of prior lumbar surgery.  EXAM: LUMBAR SPINE - COMPLETE 4+ VIEW  COMPARISON:   Two views lumbar spine 11/23/2013 and 01/04/2014.  FINDINGS: Again seen is postoperative change of L2-S1 fusion. Hardware is intact and unchanged in appearance. Vertebral body height and alignment are maintained. Loss of disc space height T12-L1 and L1-2 appears unchanged.  IMPRESSION: No acute finding.  Status post L2-S1 fusion without change in appearance.   Electronically Signed   By: Inge Rise M.D.   On: 12/24/2014 10:47   Dg Hip Complete Left  12/24/2014   CLINICAL DATA:  Left hip pain for 4 days, no known injury, initial encounter  EXAM: LEFT HIP - COMPLETE 2+ VIEW  COMPARISON:  None.  FINDINGS: Postsurgical changes are noted in the lower lumbar spine. The pelvic ring is intact. No acute fracture or dislocation is seen. Mild degenerative changes of the hip joints are noted.  IMPRESSION: No acute abnormality noted.   Electronically Signed   By: Inez Catalina M.D.   On: 12/24/2014 10:49  EKG Interpretation None      MDM   Final diagnoses:  Lower back pain  Back strain, initial encounter    10:13 AM 73 y.o. male with history of lumbar fusion who presents with left lower lumbar paraspinal pain which has been going on for the last 4 days. The patient notes that he exercises daily and had some bilateral lower lumbar paraspinal pain on 04/12/23 of this week. He performed dead lifts on 2023-04-12 and noticed that the right-sided pain resolved but he had worsening of his left lower back pain. It is reproducible with palpation and movement on exam. He has taken NSAIDs without significant relief. He denies any back pain red flags. He is afebrile and vital signs are unremarkable here. We'll get screening imaging labs and pain control.  12:37 PM: I interpreted/reviewed the labs and/or imaging which were non-contributory.  We'll provide stronger pain control and muscle relaxants. The patient was warned about becoming too sedated with these medications. I suspect his pain is due to back strain as he  has a good story for this and he has focal tenderness on exam. I do not think this is an underlying serious occult issues such as an abdominal aortic aneurysm. His pain is easily reproduced on exam with twisting of the torso. I have discussed the diagnosis/risks/treatment options with the patient and believe the pt to be eligible for discharge home to follow-up with his pcp in 3 days. We also discussed returning to the ED immediately if new or worsening sx occur. We discussed the sx which are most concerning (e.g., worsening pain, fever, weakness, numbness, bowel/bladder incont) that necessitate immediate return. Medications administered to the patient during their visit and any new prescriptions provided to the patient are listed below.  Medications given during this visit Medications  oxyCODONE-acetaminophen (PERCOCET/ROXICET) 5-325 MG per tablet 1 tablet (1 tablet Oral Given 12/24/14 1050)    Discharge Medication List as of 12/24/2014 12:24 PM    START taking these medications   Details  cyclobenzaprine (FLEXERIL) 10 MG tablet Take 0.5 tablets (5 mg total) by mouth 2 (two) times daily as needed for muscle spasms., Starting 12/24/2014, Until Discontinued, Print    !! docusate sodium (COLACE) 100 MG capsule Take 1 capsule (100 mg total) by mouth every 12 (twelve) hours., Starting 12/24/2014, Until Discontinued, Print    oxyCODONE-acetaminophen (PERCOCET) 5-325 MG per tablet Take 1-2 tablets by mouth every 6 (six) hours as needed., Starting 12/24/2014, Until Discontinued, Print     !! - Potential duplicate medications found. Please discuss with provider.       Pamella Pert, MD 12/24/14 [phone]

## 2014-12-24 NOTE — ED Notes (Addendum)
Pt reports to ed with left hip pain that started 12/21 (4 days ago). Pt works out and states he did deadlifts that day before onset of pain. Pt states pain is sharp and travels down leg with certain positions. Pt denies trauma/fall. Alert/oriented. Vs wdl. Pt able to walk, move leg, etc. Just causes pain.

## 2014-12-27 ENCOUNTER — Encounter: Payer: Self-pay | Admitting: Family Medicine

## 2014-12-27 ENCOUNTER — Ambulatory Visit (INDEPENDENT_AMBULATORY_CARE_PROVIDER_SITE_OTHER): Payer: Commercial Managed Care - HMO | Admitting: Family Medicine

## 2014-12-27 VITALS — BP 134/81 | HR 84 | Temp 98.7°F | Ht 67.0 in | Wt 176.9 lb

## 2014-12-27 DIAGNOSIS — M545 Low back pain, unspecified: Secondary | ICD-10-CM

## 2014-12-27 DIAGNOSIS — K5909 Other constipation: Secondary | ICD-10-CM

## 2014-12-27 HISTORY — DX: Low back pain, unspecified: M54.50

## 2014-12-27 MED ORDER — CYCLOBENZAPRINE HCL 10 MG PO TABS: 5.0000 mg | ORAL_TABLET | Freq: Two times a day (BID) | ORAL | Status: DC | PRN

## 2014-12-27 MED ORDER — TRAMADOL HCL 50 MG PO TABS: 50.0000 mg | ORAL_TABLET | Freq: Three times a day (TID) | ORAL | Status: DC | PRN

## 2014-12-27 MED ORDER — POLYETHYLENE GLYCOL 3350 17 GM/SCOOP PO POWD: 17.0000 g | Freq: Two times a day (BID) | ORAL | Status: DC | PRN

## 2014-12-27 NOTE — Patient Instructions (Signed)
Thank you for coming in,   You can stop taking the percocet. Try the tramadol for pain.   You can use ice on the area. Ice for 20 minutes on and off. You can use heat as well.   Please try to move and stretch. These things will help you loosen up the muscles in your back.   If you haven't had a stool by Wednesday. Please call the office and let Dr. Ree Kida know so he may send in another medication.    Please feel free to call with any questions or concerns at any time, at [phone]. --Dr. Raeford Razor

## 2014-12-27 NOTE — Assessment & Plan Note (Signed)
Acute. Extensive surgical hx. Films in ED were neg. Most likely a muscle spasm.  Possible for piriformis syndrome or SI joint dysfunction. Exam limited today based on the amount of pain he was in with movement.  Encouraged to stop deadlifting, squats and leg presses.  Hasn't had a bowel movement in 5 days since starting narcotic.  - heat and ice PRN  - stop percocet and started Tramadol 50 mg Q8 PRN  - given home exercises  - referral to PT  - Miralax for lack of bowel movement   - - Pt. will call back by Wed if no Bowel movement  - refilled flexeril 5 mg BID PRN  - if no improvement in 4 weeks of PT, consider further imaging due to his extensive past surgical hx

## 2014-12-27 NOTE — Progress Notes (Signed)
    Subjective   William [name] is a 73 y.o. male that presents for a same day visit  1. Left hip pain: history of back surgery last October with fusion of 5 levels, L2-S1. Imaging in the ED neg for acute fractures but shows surgical changes. First felt pain on 20th where it was light and then became more painful on 23rd.  He was doing a deadlift on the 20th. Pain getting worse. Worse with movements. Has been using a heating pad. Prescribed flexeril and percocet by the ED on 12/25. Pain on left lateral thigh but feels more like a soreness. No numbness or tingling in lower legs. No weakness. Hasn't been able to have a bowel movment.    History  Substance Use Topics  . Smoking status: Former Smoker -- 1.50 packs/day for 30 years    Quit date: 04/02/1986  . Smokeless tobacco: Never Used  . Alcohol Use: 0.0 oz/week     Comment: Rare beer    ROS Per HPI  Objective   BP 134/81 mmHg  Pulse 84  Temp(Src) 98.7 F (37.1 C) (Oral)  Ht 5\' 7"  (1.702 m)  Wt 176 lb 14.4 oz (80.241 kg)  BMI 27.70 kg/m2  General: Appears in pain with movement.  Keeps back as straight as possible.  Back:  Appearance: sciolosis no Palpation: tenderness of paraspinal muscles yes on the left , spinous process no; pelvis yes occurring proximal to the iliac crest on left side  Flexion: decreased due to exacerbation of pain  Extension: decreased due to pain    Hip:  Appearance:symmetric, no ecchymosis  Palpation: tenderness of greater trochanter no Rotation Reduced: internal normal on right, external normal on right. Internal normal on left, external reduced on left  FADIR: neg b/l  FABER: pos on left   Neuro: Strength hip flexion 5/5,  knee extension 5/5, knee flexion 5/5, dorsiflexion 5/5, plantar flexion 5/5 Reflexes: patella 1/2 Bilateral  Achilles 1/2 Bilateral Straight Leg Raise: negative but with mild left lateral numbness of lateral thigh  Sensation to light touch intact: yes Neurovascularly intact     Assessment and Plan   Please refer to problem based charting of assessment and plan

## 2014-12-29 ENCOUNTER — Encounter: Payer: Self-pay | Admitting: *Deleted

## 2014-12-29 NOTE — Progress Notes (Signed)
Wife of patient came into office today stating patient is still not able to have a bowel movement.  He is at home "in tears from the pain."  He is using both stool softeners with no relief.  Advised per AVS to ask Dr. Ree Kida for advise.  Pt and wife were instructed to buy 2- 2 packs of adult fleet enemas.  He can do this twice a day until he gets relief.  Also they can try magnesium citrate if there is still no relief.  Wife voiced understanding.  Per Dr. Ree Kida patient should get results after the enemas.  Wife was given the number for the hospital to contact the on call doctor while clinic is closed for holidays is there is no relief.   Jazmin Hartsell,CMA

## 2015-01-05 ENCOUNTER — Ambulatory Visit: Payer: Commercial Managed Care - HMO | Attending: Family Medicine | Admitting: Physical Therapy

## 2015-01-05 ENCOUNTER — Telehealth: Payer: Self-pay | Admitting: *Deleted

## 2015-01-05 DIAGNOSIS — M545 Low back pain, unspecified: Secondary | ICD-10-CM

## 2015-01-05 NOTE — Patient Instructions (Signed)
Bending   Bend at hips and knees, not back. Keep feet shoulder-width apart.   Copyright  VHI. All rights reserved.   Side Stretch, Standing Bend   Stand, one arm straight over head. Place other hand on hip and bend to that side as far as is comfortable. Hold _10-15__ seconds. Repeat __2-3_ times per session. Do 2_ sessions per day.  Copyright  VHI. All rights reserved.   Outer Hip Stretch: Reclined IT Band Stretch (Strap)   Strap around opposite foot, pull across only as far as possible with shoulders on mat. Hold for __3_ breaths. Repeat ___2_ times each leg.  Copyright  VHI. All rights reserved.  Hamstring Stretch   With other leg bent, foot flat, grasp right leg and slowly try to straighten knee. Hold _30___ seconds. Repeat _2-3__ times. Do _2___ sessions per day.  http://[email or web address].exer.us/279   Copyright  VHI. All rights reserved.

## 2015-01-05 NOTE — Therapy (Addendum)
[facility] [place], [place], [address] Phone: [phone]   Fax:  [phone]  Physical Therapy Evaluation/Discharge  Patient Details  Name: William [name] MRN: [id number] Date of Birth: 16-Jun-1941  Encounter Date: 01/05/2015      PT End of Session - 01/05/15 1026    Visit Number 1   Number of Visits 12   Date for PT Re-Evaluation 02/16/15   PT Start Time 0933   PT Stop Time 1017   PT Time Calculation (min) 44 min   Activity Tolerance Patient tolerated treatment well      Past Medical History  Diagnosis Date  . Dyslipidemia (high LDL; low HDL)   . Numbness and tingling of right leg     Past Surgical History  Procedure Laterality Date  . Lumbar disc surgery  09/2007    bone spurs, stenosis, L3-4, 4-5  . Knee arthroscopy w/ meniscal repair  2008    left  . Knee arthroscopy w/ meniscal repair  11/2011    left  . Laceration repair  1962    forehead  . Small intestine surgery  8101    ileitis complications  . Bone chip removed from right heel  1998  . Finger amputation      injured by table saw, left 4th and 5th fingers  . Skin graft      from right thigh to left forehead  . Posterior lumbar fusion 4 level N/A 10/13/2013    Procedure: Lumbar Two-Three, Lumbar Three-Four, Lumbar Four-Five, Lumbar Five-Sacral One posterior lumbar interbody fusion with interbody prosthesis posterior lateral arthrodesis and posterior segmental instrumentation;  Surgeon: [name], MD;  Location: [facility];  Service: Neurosurgery;  Laterality: N/A;  POSTERIOR LUMBAR FUSION 4 LEVEL    There were no vitals taken for this visit.  Visit Diagnosis:  Left-sided low back pain without sciatica      Subjective Assessment - 01/05/15 0938    Symptoms Patient presents with pain in L Low back and L hip/glute which he attributes to an exercise at the gym (weighted spinal twisting) Prior he had occ soreness.  Initially pain did  radiate, now is local.  Stiffness in back is on going.     Pertinent History spinal fusion 2014 and another back surgery 2008, knee arthroscopy x2    Limitations Other (comment);Sitting;Standing;Walking;Lifting  fitness goals (running, .liftting), turning in bed, sleep   How long can you sit comfortably? 2 hours   How long can you stand comfortably? 1 hour   How long can you walk comfortably? 1 hour, increases with faster pace, running can be 5/10, 6/10   Diagnostic tests XR (hip, back)   Patient Stated Goals to run again   Currently in Pain? Yes   Pain Score 2    Pain Location Back   Pain Orientation Left   Pain Descriptors / Indicators Aching;Sharp   Pain Type Acute pain;Chronic pain   Pain Radiating Towards prox hip   Pain Onset 1 to 4 weeks ago   Pain Frequency Intermittent   Aggravating Factors  uncertain, running and turning in bed   Pain Relieving Factors rest, heat and pain meds   Effect of Pain on Daily Activities Cannot do usual fitness routine   Multiple Pain Sites No          [facility] PT Assessment - 01/05/15 0948    Assessment   Medical Diagnosis back pain   Onset Date 12/19/14   Prior Therapy  for knee   Precautions   Precautions None   Balance Screen   Has the patient fallen in the past 6 months No   Has the patient had a decrease in activity level because of a fear of falling?  No   Is the patient reluctant to leave their home because of a fear of falling?  No   Home Environment   Living Enviornment Private residence   Living Arrangements Spouse/significant other   Prior Function   Level of Independence Independent with basic ADLs   Cognition   Overall Cognitive Status Within Functional Limits for tasks assessed   Observation/Other Assessments   Focus on Therapeutic Outcomes (FOTO)  45% limit   Sensation   Light Touch Appears Intact   Posture/Postural Control   Posture/Postural Control Postural limitations   Postural Limitations Decreased lumbar  lordosis;Decreased thoracic kyphosis;Posterior pelvic tilt   AROM   Lumbar Flexion 45   Lumbar Extension 20   Lumbar - Right Side Bend 25   Lumbar - Left Side Bend 15  pain   Lumbar - Right Rotation 25%   Lumbar - Left Rotation 25%   Strength   Right Hip Flexion --  4+/5   Right Hip Extension 4/5   Right Hip ABduction --  4+/5   Left Hip Flexion --  4+/5   Left Hip Extension 4/5   Left Hip ABduction --  4+/5    Palpation: Pain with palp to Lt. QL and into L glute medius      PT Education - 01/05/15 1025    Education provided Yes   Education Details PT/POC, HEP and intro to safe gym ex, avoid weights for trunk   Person(s) Educated Patient   Methods Explanation;Demonstration;Handout;Verbal cues   Comprehension Verbalized understanding;Returned demonstration          PT Short Term Goals - 01/05/15 1030    PT SHORT TERM GOAL #1   Title Pt will be I with initial HEP for stretching and stab.    Time 2   Period Weeks   Status New   PT SHORT TERM GOAL #2   Title Pt. will report no pain with turning in bed, changing positions.    Time 2   Period Weeks   Status New           PT Long Term Goals - 01/05/15 1031    PT LONG TERM GOAL #1   Title Pt. will demo safe lifting for home and gym without pain increase   Time 4   Period Weeks   Status New   PT LONG TERM GOAL #2   Title Pt. will report no pain in Low back with modified gym routine.   Time 4   Period Weeks   Status New   PT LONG TERM GOAL #3   Title Pt. will be I with advanced HEP   Time 4   Period Weeks   Status New   PT LONG TERM GOAL #4   Title Pt. will demo FOTO < 35% to show improved function.    Time 4   Period Weeks   Status New               Plan - 01/05/15 1027    Clinical Impression Statement This patient is highly motivated to return to exercise plan.  He will benefit from guided corrective exercise and trunk/hip flexibility to assist with modified Ex routine.  He likely strained  a muscle during an unsafe maneuver  at the gym.    Pt will benefit from skilled therapeutic intervention in order to improve on the following deficits Decreased range of motion;Impaired flexibility;Improper body mechanics;Decreased safety awareness;Increased fascial restricitons;Pain;Decreased mobility;Decreased strength   Rehab Potential Excellent   PT Frequency 2x / week   PT Duration 6 weeks  4-6 weeks if needed   PT Treatment/Interventions Moist Heat;Therapeutic activities;Patient/family education;Passive range of motion;Therapeutic exercise;Ultrasound;Manual techniques;Dry needling;Cryotherapy;Neuromuscular re-education;Electrical Stimulation   PT Next Visit Plan review stretches and address L QL with manual. Check body mech with lifting, dead lift?   PT Home Exercise Plan HS and ITB stretch, sidebend   Consulted and Agree with Plan of Care Patient          G-Codes - 10-Jan-2015 1034    Functional Assessment Tool Used FOTO   Functional Limitation Changing and maintaining body position   Changing and Maintaining Body Position Current Status [phone]) At least 40 percent but less than 60 percent impaired, limited or restricted   Changing and Maintaining Body Position Goal Status ([id number]) At least 20 percent but less than 40 percent impaired, limited or restricted       Problem List Patient Active Problem List   Diagnosis Date Noted  . Low back pain 12/27/2014  . Medial epicondylitis of right elbow 12/13/2014  . Left wrist pain 12/13/2014  . Superficial bruising 08/04/2014  . Toenail fungus 08/04/2014  . AK (actinic keratosis) 08/04/2014  . Seborrheic dermatitis of scalp 08/04/2014  . Bilateral lower extremity edema 11/16/2013  . Screening for colon cancer 09/28/2013  . Impotence due to erectile dysfunction 08/26/2013  . Radiculopathy with lower extremity symptoms 06/03/2013  . Screening for abdominal aortic aneurysm 07/31/2012  . Emphysema 04/10/2012  . KNEE PAIN, LEFT 05/16/2010   . HYPERLIPIDEMIA 04/29/2007  . Hyperplasia of prostate 02/27/2007  . Spinal stenosis of lumbar region 02/27/2007    Bridgitt Raggio 2015/01/10, 10:35 AM  [facility] [place], [place], [address] Phone: [phone]   Fax:  [phone]  Raeford Razor, PT 01/10/15 10:36 AM Phone: [phone] Fax: [phone]   PHYSICAL THERAPY DISCHARGE SUMMARY  Visits from Start of Care: 1  Current functional level related to goals / functional outcomes: Unknown, did not return   Remaining deficits: Unknown   Education / Equipment: POC, PT Plan: Patient agrees to discharge.  Patient goals were not met. Patient is being discharged due to not returning since the last visit.  ?????   Raeford Razor, PT 11/08/2015 9:16 AM Phone: [phone] Fax: [phone]

## 2015-01-05 NOTE — Telephone Encounter (Signed)
Called patient and informed him that stool card were too thick and that I would be leaving new cards up front for him to pick up. Patient expressed understanding.

## 2015-01-05 NOTE — Telephone Encounter (Signed)
-----   Message from Lupita Dawn, MD sent at 12/27/2014 10:19 AM EST ----- Rudi Rummage,  Please call this patient and have him come back in to repeat stool hemoccult cards for colon cancer screening. Thanks  Marylyn Ishihara  ----- Message -----    From: [place] Pink, CMA    Sent: 12/22/2014   4:03 PM      To: Lupita Dawn, MD

## 2015-01-18 ENCOUNTER — Encounter: Payer: Commercial Managed Care - HMO | Admitting: Physical Therapy

## 2015-01-20 ENCOUNTER — Encounter: Payer: Commercial Managed Care - HMO | Admitting: Physical Therapy

## 2015-03-10 ENCOUNTER — Ambulatory Visit (INDEPENDENT_AMBULATORY_CARE_PROVIDER_SITE_OTHER): Payer: Commercial Managed Care - HMO | Admitting: Family Medicine

## 2015-03-10 ENCOUNTER — Encounter: Payer: Self-pay | Admitting: Family Medicine

## 2015-03-10 VITALS — BP 129/78 | HR 68 | Ht 67.0 in | Wt 174.5 lb

## 2015-03-10 DIAGNOSIS — B070 Plantar wart: Secondary | ICD-10-CM | POA: Diagnosis not present

## 2015-03-10 DIAGNOSIS — Z0000 Encounter for general adult medical examination without abnormal findings: Secondary | ICD-10-CM | POA: Diagnosis not present

## 2015-03-10 DIAGNOSIS — Z23 Encounter for immunization: Secondary | ICD-10-CM

## 2015-03-10 DIAGNOSIS — Z1211 Encounter for screening for malignant neoplasm of colon: Secondary | ICD-10-CM | POA: Diagnosis not present

## 2015-03-10 NOTE — Progress Notes (Signed)
Subjective:   William [name] is a 74 y.o. male who presents for Medicare Annual/Subsequent preventive examination.     Objective:    Vitals: BP 129/78 mmHg  Pulse 68  Ht 5\' 7"  (1.702 m)  Wt 174 lb 8 oz (79.153 kg)  BMI 27.32 kg/m2  Tobacco History  Smoking status  . Former Smoker -- 1.50 packs/day for 30 years  . Quit date: 04/02/1986  Smokeless tobacco  . Never Used     Counseling given: No   Past Medical History  Diagnosis Date  . Dyslipidemia (high LDL; low HDL)   . Numbness and tingling of right leg    Past Surgical History  Procedure Laterality Date  . Lumbar disc surgery  09/2007    bone spurs, stenosis, L3-4, 4-5  . Knee arthroscopy w/ meniscal repair  2008    left  . Knee arthroscopy w/ meniscal repair  11/2011    left  . Laceration repair  1962    forehead  . Small intestine surgery  0086    ileitis complications  . Bone chip removed from right heel  1998  . Finger amputation      injured by table saw, left 4th and 5th fingers  . Skin graft      from right thigh to left forehead  . Posterior lumbar fusion 4 level N/A 10/13/2013    Procedure: Lumbar Two-Three, Lumbar Three-Four, Lumbar Four-Five, Lumbar Five-Sacral One posterior lumbar interbody fusion with interbody prosthesis posterior lateral arthrodesis and posterior segmental instrumentation;  Surgeon: [name], MD;  Location: [facility];  Service: Neurosurgery;  Laterality: N/A;  POSTERIOR LUMBAR FUSION 4 LEVEL   Family History  Problem Relation Age of Onset  . Heart disease Father   . Cancer Maternal Grandfather   . Cancer Maternal Grandmother    History  Sexual Activity  . Sexual Activity:  . Partners: Female    Outpatient Encounter Prescriptions as of 03/10/2015  Medication Sig  . aspirin EC 81 MG tablet Take 81 mg by mouth daily.  . cyclobenzaprine (FLEXERIL) 10 MG tablet Take 0.5 tablets (5 mg total) by mouth 2 (two) times daily as needed for muscle spasms.  Marland Kitchen docusate  sodium (COLACE) 100 MG capsule Take 100 mg by mouth daily.   Marland Kitchen docusate sodium (COLACE) 100 MG capsule Take 1 capsule (100 mg total) by mouth every 12 (twelve) hours.  Marland Kitchen KETOCONAZOLE, TOPICAL, 1 % SHAM Wash your scalp/hair daily with shampoo (Patient not taking: Reported on 12/24/2014)  . Multiple Vitamin (MULTIVITAMIN WITH MINERALS) TABS tablet Take 1 tablet by mouth daily.  . Omega-3 Fatty Acids (FISH OIL CONCENTRATE PO) Take 1 tablet by mouth.  . oxyCODONE-acetaminophen (PERCOCET) 5-325 MG per tablet Take 1-2 tablets by mouth every 6 (six) hours as needed.  . polyethylene glycol powder (GLYCOLAX/MIRALAX) powder Take 17 g by mouth 2 (two) times daily as needed.  . traMADol (ULTRAM) 50 MG tablet Take 1 tablet (50 mg total) by mouth every 8 (eight) hours as needed.    Activities of Daily Living In your present state of health, do you have any difficulty performing the following activities: 03/10/2015  Is the patient deaf or have difficulty hearing? N  Hearing N  Vision N  Difficulty concentrating or making decisions N  Walking or climbing stairs? N  Doing errands, shopping? N    Patient Care Team: Lupita Dawn, MD as PCP - General (Family Medicine) Marica Otter, OD (Optometry) Latanya Maudlin, MD (  Orthopedic Surgery)   Assessment:    Pt has no new risk factors that need evaluation.  Pt reports exercising 6-7x a week at [facility] both weights and cardio.  Exercise Activities and Dietary recommendations    Goals    . Eat 4-5 vegetables a day    . Weight < 167 lb (75.751 kg)      Fall Risk Fall Risk  03/10/2015 12/27/2014 12/28/2013 09/28/2013  Falls in the past year? No No No No   Depression Screen PHQ 2/9 Scores 03/10/2015 12/27/2014 12/13/2014 12/28/2013  PHQ - 2 Score 0 0 0 2    Cognitive Testing MMSE - Mini Mental State Exam 03/10/2015 07/28/2012 04/03/2011  Orientation to time 5 5 5   Orientation to Place 5 5 5   Registration 3 3 3   Attention/ Calculation 0 2 5  Recall 1 2 3     Language- name 2 objects 2 2 2   Language- repeat 1 1 1   Language- follow 3 step command 3 3 3   Language- read & follow direction 1 1 1   Write a sentence 1 1 1   Copy design 1 1 1   Total score 23 26 30     Immunization History  Administered Date(s) Administered  . Influenza Split 10/03/2011, 10/21/2012  . Influenza,inj,Quad PF,36+ Mos 09/25/2013  . Influenza-Unspecified 10/03/2014  . Pneumococcal Polysaccharide-23 04/29/2007  . Td 04/03/2011  . Zoster 04/29/2007   Screening Tests Health Maintenance  Topic Date Due  . COLONOSCOPY  03/12/1992  . PNA vac Low Risk Adult (2 of 2 - PCV13) 04/28/2008  . LIPID PANEL  11/19/2014  . INFLUENZA VACCINE  08/01/2015  . COLON CANCER SCREENING ANNUAL FOBT  12/23/2015  . TETANUS/TDAP  04/02/2021  . ZOSTAVAX  Completed      Plan:    During the course of the visit the patient was educated and counseled about the following appropriate screening and preventive services:   Vaccines to include Prevnar 13.  Colorectal cancer screening- pt accept hemoccult card, not interested in colonoscopy at this time.   Patient Instructions (the written plan) was given to the patient.    William [name]  03/10/2015

## 2015-03-10 NOTE — Patient Instructions (Signed)
Plantar Warts Warts are benign (noncancerous) growths of the outer skin layer. They can occur at any time in life but are most common during childhood and the teen years. Warts can occur on many skin surfaces of the body. When they occur on the underside (sole) of your foot they are called plantar warts. They often emerge in groups with several small warts encircling a larger growth. CAUSES  Human papillomavirus (HPV) is the cause of plantar warts. HPV attacks a break in the skin of the foot. Walking barefoot can lead to exposure to the wart virus. Plantar warts tend to develop over areas of pressure such as the heel and ball of the foot. Plantar warts often grow into the deeper layers of skin. They may spread to other areas of the sole but cannot spread to other areas of the body. SYMPTOMS  You may also notice a growth on the undersurface of your foot. The wart may grow directly into the sole of the foot, or rise above the surface of the skin on the sole of the foot, or both. They are most often flat from pressure. Warts generally do not cause itching but may cause pain in the area of the wart when you put weight on your foot. DIAGNOSIS  Diagnosis is made by physical examination. This means your caregiver discovers it while examining your foot.  TREATMENT  There are many ways to treat plantar warts. However, warts are very tough. Sometimes it is difficult to treat them so that they go away completely and do not grow back. Any treatment must be done regularly to work. If left untreated, most plantar warts will eventually disappear over a period of one to two years. Treatments you can do at home include:  Putting duct tape over the top of the wart (occlusion) has been found to be effective over several months. The duct tape should be removed each night and reapplied until the wart has disappeared.  Placing over-the-counter medications on top of the wart to help kill the wart virus and remove the wart  tissue (salicylic acid, cantharidin, and dichloroacetic acid) are useful. These are called keratolytic agents. These medications make the skin soft and gradually layers will shed away. These compounds are usually placed on the wart each night and then covered with a bandage. They are also available in premedicated bandage form. Avoid surrounding skin when applying these liquids as these medications can burn healthy skin. The treatment may take several months of nightly use to be effective.  Cryotherapy to freeze the wart has recently become available over-the-counter for children 4 years and older. This system makes use of a soft narrow applicator connected to a bottle of compressed cold liquid that is applied directly to the wart. This medication can burn healthy skin and should be used with caution.  As with all over-the-counter medications, read the directions carefully before use. Treatments generally done in your caregiver's office include:  Some aggressive treatments may cause discomfort, discoloration, and scarring of the surrounding skin. The risks and benefits of treatment should be discussed with your caregiver.  Freezing the wart with liquid nitrogen (cryotherapy, see above).  Burning the wart with use of very high heat (cautery).  Injecting medication into the wart.  Surgically removing or laser treatment of the wart.  Your caregiver may refer you to a dermatologist for difficult to treat large-sized warts or large numbers of warts. HOME CARE INSTRUCTIONS   Soak the affected area in warm water. Dry the   area completely when you are done. Remove the top layer of softened skin, then apply the chosen topical medication and reapply a bandage.  Remove the bandage daily and file excess wart tissue (pumice stone works well for this purpose). Repeat the entire process daily or every other day for weeks until the plantar wart disappears.  Several brands of salicylic acid pads are available  as over-the-counter remedies.  Pain can be relieved by wearing a donut bandage. This is a bandage with a hole in it. The bandage is put on with the hole over the wart. This helps take the pressure off the wart and gives pain relief. To help prevent plantar warts:  Wear shoes and socks and change them daily.  Keep feet clean and dry.  Check your feet and your children's feet regularly.  Avoid direct contact with warts on other people.  Have growths or changes on your skin checked by your caregiver. Document Released: 03/08/2004 Document Revised: 05/03/2014 Document Reviewed: 08/17/2009 ExitCare Patient Information 2015 [employer]. This information is not intended to replace advice given to you by your health care provider. Make sure you discuss any questions you have with your health care provider.  

## 2015-03-15 ENCOUNTER — Ambulatory Visit: Payer: Commercial Managed Care - HMO | Admitting: Home Health Services

## 2015-03-15 DIAGNOSIS — B070 Plantar wart: Secondary | ICD-10-CM | POA: Insufficient documentation

## 2015-03-15 NOTE — Progress Notes (Signed)
Patient ID: William [name], male   DOB: December 08, 1941, 74 y.o.   MRN: [id number]  HPI:  Pt presents for a same day appointment to discuss pain on bottom of foot.  Began to have discomfort on Jan 18. Unsure if he stepped on something. Hurts occasionally with ambulation. Thinks he might have a foreign body in his foot. It started out red, has not had any further redness or drainage. No fevers.   ROS: See HPI  [place]: hx erectile dysfxn, HLD, BPH, lumbar spinal stenosis  PHYSICAL EXAM: BP 129/78 mmHg  Pulse 68  Ht 5\' 7"  (1.702 m)  Wt 174 lb 8 oz (79.153 kg)  BMI 27.32 kg/m2 Gen: NAD Ext: R plantar aspect of foot with small superficial papule, with possible foreign body visible.  After informed consent was contained, the area was cleaned with alcohol. A scalpel was used to sharply remove a callus overlying the area in question. No foreign body was identified. This upon further inspection after removal of the callus, this area was consistent with a plantar wart. Cryotherapy was then performed with liquid nitrogen. Patient tolerated the procedure well without any immediate complications.  ASSESSMENT/PLAN:  Plantar wart of right foot Treated with liquid nitrogen today. Informed patient that repeat treatment may be necessary. He will follow up as needed.     Note: Medicare annual wellness visit was also performed during this visit by health coach Lamont Dowdy. I have reviewed and agree with her documentation.  FOLLOW UP: F/u as needed if symptoms worsen or do not improve.   [place]. Ardelia Mems, [place]

## 2015-03-15 NOTE — Assessment & Plan Note (Signed)
Treated with liquid nitrogen today. Informed patient that repeat treatment may be necessary. He will follow up as needed.

## 2015-03-16 NOTE — Progress Notes (Signed)
I have reviewed and agree with the documentation by Lamont Dowdy.  Dossie Arbour MD

## 2015-03-28 LAB — POC HEMOCCULT BLD/STL (HOME/3-CARD/SCREEN)
Card #3 Fecal Occult Blood, POC: NEGATIVE
FECAL OCCULT BLD: NEGATIVE
FECAL OCCULT BLD: NEGATIVE

## 2015-03-28 NOTE — Addendum Note (Signed)
Addended by: [place], Jmari Pelc on: 03/28/2015 05:29 PM   Modules accepted: Orders

## 2015-03-29 ENCOUNTER — Encounter: Payer: Self-pay | Admitting: Family Medicine

## 2015-03-29 ENCOUNTER — Ambulatory Visit (INDEPENDENT_AMBULATORY_CARE_PROVIDER_SITE_OTHER): Payer: Commercial Managed Care - HMO | Admitting: Family Medicine

## 2015-03-29 VITALS — BP 116/72 | HR 65 | Temp 97.7°F | Ht 67.0 in | Wt 170.8 lb

## 2015-03-29 DIAGNOSIS — B070 Plantar wart: Secondary | ICD-10-CM | POA: Diagnosis not present

## 2015-03-29 NOTE — Progress Notes (Signed)
   Subjective:    Patient ID: William [name], male    DOB: [birth year], 74 y.o.   MRN: [id number]  HPI 74 y/o male presents for follow up of planar wart of right foot.   Patient underwent cryotherapy 2 weeks ago, has not had improvement of symptoms, pain is limiting his ability to run, reports no blistering after previous treatment. No redness of the area, no drainage.    Review of Systems No fevers or chills    Objective:   Physical Exam Vitals: reviewed Skin: plantar wart on dorsum of right foot  Procedure: verbal consent obtained, scalpal used to remove overlying hyperkeratotic tissues, cryotherapy of right plantar wart completed X3 cycles, patient tolerated well.      Assessment & Plan:  Please see problem specific assessment and plan.

## 2015-03-29 NOTE — Assessment & Plan Note (Signed)
No improvement of right plantar wart. -repeated scalpal shave and cryotherapy

## 2015-03-29 NOTE — Patient Instructions (Signed)
Please call Dr. Ree Kida in 2 weeks if your symptoms do not improved.

## 2015-05-17 ENCOUNTER — Ambulatory Visit: Payer: Commercial Managed Care - HMO | Admitting: Family Medicine

## 2015-05-23 ENCOUNTER — Ambulatory Visit (INDEPENDENT_AMBULATORY_CARE_PROVIDER_SITE_OTHER): Payer: Commercial Managed Care - HMO | Admitting: Family Medicine

## 2015-05-23 ENCOUNTER — Encounter: Payer: Self-pay | Admitting: Family Medicine

## 2015-05-23 VITALS — BP 120/69 | HR 63 | Temp 98.4°F | Ht 67.0 in | Wt 168.0 lb

## 2015-05-23 DIAGNOSIS — L570 Actinic keratosis: Secondary | ICD-10-CM | POA: Diagnosis not present

## 2015-05-23 DIAGNOSIS — B070 Plantar wart: Secondary | ICD-10-CM | POA: Diagnosis not present

## 2015-05-23 DIAGNOSIS — M79645 Pain in left finger(s): Secondary | ICD-10-CM | POA: Diagnosis not present

## 2015-05-23 NOTE — Patient Instructions (Signed)
Actinic Keratosis - please call Dr. Ree Kida if the areas on your face do not improve  Plantar Wart - please call Dr. Ree Kida if you have a recurrence of the foot wart  Left pointer finger pain - ice twice daily, take 400-600 mg Ibuprofen up to 3 times per day

## 2015-05-23 NOTE — Assessment & Plan Note (Signed)
Cryotherapy performed on 6 lesions (1 on right temple, 1 on left temple, 1 on left cheek, 3 on left ear) -see procedure note

## 2015-05-23 NOTE — Assessment & Plan Note (Signed)
Improved plantar wart of right foot. Still has some remaining wart -repeated scalpel debridement and cryotherapy

## 2015-05-23 NOTE — Assessment & Plan Note (Signed)
Left 4th digit over medial aspect of proximal phalanx. Exam unremarkable. No concern for fracture. -trial of NSAID's and ice

## 2015-05-23 NOTE — Progress Notes (Addendum)
   Subjective:    Patient ID: DEO MEHRINGER, male    DOB: [birth year], 74 y.o.   MRN: [id number]  HPI 74 y/o male presents for follow up of plantar wart and AK on face.  Right foot plantar wart - resolved with cryotherapy after last visit however has recurred, much smaller than last visit, minimal pain with ambulation  Multiple AK on face - patient has had cryotherapy on face within the past 6 months, reports general improvement however has noted return of some lesions and new lesion on left ear  Left pointer finger pain - sustained injury 8 weeks ago, had handle of storm door hit him in the finger, has swelling and pain initially, swelling has improved, no pain at rest, 3/10 pain with flexion over medial aspect of 4th proximal phalanx, no weakness, no numbness  Social - patient is a runner, not a smoker   Review of Systems  Constitutional: Negative for fever, chills and fatigue.  Musculoskeletal: Positive for arthralgias.       Objective:   Physical Exam Vitals: reviewed Gen: pleasant male, NAD MSK: tenderness of medial aspect of left 4th digit (over proximal phalanx), flexor and extensor mechanisms of PIP, DIP, and MCP joint normal, no swelling or redness, capillary refill normal Skin - multiple AK (6 in total); 1 of right temple, 1 of left temple, 1 of left cheek, 3 of left ear; plantar war of right foot (smaller than previously)  Procedure Note: Cryotherapy of AK Verbal consent obtained, cryotherapy of 7 AK on face performed, total of 3 freeze-thaw cycles, patient tolerated well  Procedure Note: Cryotherapy of Plantar Wart Verbal consent obtained, 15 blade scalpel used to remover hyperkeratotic tissue, minimal bleeding, cryotherapy of lesion performed for total of 3 freeze-thaw cycles     Assessment & Plan:  Please see problem specific assessment and plan.

## 2015-08-03 DIAGNOSIS — H5203 Hypermetropia, bilateral: Secondary | ICD-10-CM | POA: Diagnosis not present

## 2015-08-03 DIAGNOSIS — H5210 Myopia, unspecified eye: Secondary | ICD-10-CM | POA: Diagnosis not present

## 2015-08-17 DIAGNOSIS — Z0100 Encounter for examination of eyes and vision without abnormal findings: Secondary | ICD-10-CM | POA: Diagnosis not present

## 2015-08-22 ENCOUNTER — Ambulatory Visit (INDEPENDENT_AMBULATORY_CARE_PROVIDER_SITE_OTHER): Payer: Commercial Managed Care - HMO | Admitting: Family Medicine

## 2015-08-22 ENCOUNTER — Encounter: Payer: Self-pay | Admitting: Family Medicine

## 2015-08-22 VITALS — BP 134/58 | HR 63 | Temp 97.9°F | Ht 67.0 in | Wt 164.6 lb

## 2015-08-22 DIAGNOSIS — L600 Ingrowing nail: Secondary | ICD-10-CM

## 2015-08-22 NOTE — Assessment & Plan Note (Addendum)
Consistent with ingrown Left great toenail medial aspect, indication for removal with worsening pain x >3-6 months. No evidence of acute infection or complication. Mild erythema/edema improved from recent warm salt soaks. - No prior toe nail removal  Plan: 1. Partial Left Great Toenail removal today - see procedure note for details. No complications, went well, toenail removed in one intact piece. Aftercare instructions given. 2. Return criteria given. Follow-up PRN

## 2015-08-22 NOTE — Progress Notes (Signed)
Subjective:    Patient ID: William [name], male    DOB: 04-23-1941, 74 y.o.   MRN: [id number]  William [name] is a 74 y.o. male presenting on 08/22/2015 for ingrown toenail  Patient presents for a same day appointment.  HPI  INGROWN TOENAIL, LEFT GREAT TOE: - Reports recent worsening on chronic history of Left great toenail ingrown for >6 months. States his wife routinely trims his toenails without difficulty, but does admit to having intermittent problems with great toenails becoming ingrown over many years. Never had toenail removed. Now seems to be worsening over past 3 months with increased pain, redness and swelling locally, his wife had tried to "dig out" his toenail a few times over past 3 months without relief. States pain is starting to interfere with regular walking and daily activities. Tried soaking foot in warm water and epsom salts with good relief. Takes ibuprofen 200mg  2-3 tabs qhs PRN.  Past Medical History  Diagnosis Date  . Dyslipidemia (high LDL; low HDL)   . Numbness and tingling of right leg     Social History   Social History  . Marital Status: Married    Spouse Name: Alleen Borne  . Number of Children: 3  . Years of Education: 10   Occupational History  . Retired- Ambulance person    Social History Main Topics  . Smoking status: Former Smoker -- 1.50 packs/day for 30 years    Quit date: 04/02/1986  . Smokeless tobacco: Never Used  . Alcohol Use: 0.0 oz/week     Comment: Rare beer  . Drug Use: No  . Sexual Activity:    Partners: Female   Other Topics Concern  . Not on file   Social History Narrative   Health Care POA:    Emergency Contact: wife, Derren Suydam, [phone]   End of Life Plan: Pt does not have, not interested at this time   Who lives with you: Lives with 3rd wife and two sons   Daughter by first marriage who lives in [place], 2 grandsons   Any pets: dog and parakeets   Diet: Patient has a varied diet and has  recently tried increasing f/v.    Exercise: Patient goings to [facility] 4-5 days a week, bike, strength training   Seatbelts: Patient reports wearing seatbelt occasionally   Sun Exposure/Protection: Patient does not wear daily sun protection.   Hobbies: Running, exercising                   Current Outpatient Prescriptions on File Prior to Visit  Medication Sig  . aspirin EC 81 MG tablet Take 81 mg by mouth daily.  Marland Kitchen docusate sodium (COLACE) 100 MG capsule Take 100 mg by mouth daily.   Marland Kitchen KETOCONAZOLE, TOPICAL, 1 % SHAM Wash your scalp/hair daily with shampoo  . Multiple Vitamin (MULTIVITAMIN WITH MINERALS) TABS tablet Take 1 tablet by mouth daily.  . Omega-3 Fatty Acids (FISH OIL CONCENTRATE PO) Take 1 tablet by mouth.   No current facility-administered medications on file prior to visit.    Review of Systems  Constitutional: Negative for fever, chills, diaphoresis, activity change, appetite change and fatigue.  Eyes: Negative for visual disturbance.  Cardiovascular: Negative for leg swelling.  Gastrointestinal: Negative for nausea, vomiting, diarrhea and constipation.  Musculoskeletal: Negative for arthralgias and neck pain.  Skin: Negative for rash and wound.       Left great toe mild swelling redness distal right aspect   Per  HPI unless specifically indicated above     Objective:    BP 134/58 mmHg  Pulse 63  Temp(Src) 97.9 F (36.6 C) (Oral)  Ht 5\' 7"  (1.702 m)  Wt 164 lb 9.6 oz (74.662 kg)  BMI 25.77 kg/m2  Wt Readings from Last 3 Encounters:  08/22/15 164 lb 9.6 oz (74.662 kg)  05/23/15 168 lb (76.204 kg)  03/29/15 170 lb 12.8 oz (77.474 kg)    Physical Exam  Constitutional: He is oriented to person, place, and time. He appears well-developed and well-nourished. No distress.  HENT:  Head: Normocephalic and atraumatic.  Mouth/Throat: Oropharynx is clear and moist.  Cardiovascular: Normal rate, regular rhythm, normal heart sounds and intact distal pulses.   No  murmur heard. Musculoskeletal: Normal range of motion. He exhibits no edema or tenderness.  Neurological: He is alert and oriented to person, place, and time.  Skin: Skin is warm and dry. No rash noted. He is not diaphoretic.  Left great toenail ingrown medially with mild +TTP and minimal localized edema, no significant erythema. No evidence of infection. No drainage of pus or bleeding. No warmth or spreading redness/rash.  Nursing note and vitals reviewed.  Results for orders placed or performed in visit on 03/10/15  POC Hemoccult Bld/Stl (3-Cd Home Screen)  Result Value Ref Range   Card #1 Date 03-18-2015    Fecal Occult Blood, POC Negative    Card #2 Date 03-19-2015    Card #2 Fecal Occult Blod, POC Negative    Card #3 Date 03-20-2015    Card #3 Fecal Occult Blood, POC Negative       Procedure note: Partial Toenail removal, Left Great Toe Consent obtained and timeout performed.  Alcohol use to clean the toe and 8 mL of 1% lidocaine without epinephrine were injected in a medial/lateral and dorsal block and under the nail plate.  Then after >5 minutes a rubber band tourniquet was applied and the toe was cleaned with Betadine.  A nail elevator was used to elevate the medial nail (1/4 of nail), and scissors were used to cut the nail approximately 5 mm from the medial edge down to the base of the nail.  Then forceps were used to grasp this now free medial piece of nail in the entire nail medial nail sliver was removed (intact and whole). The tourniquet was removed.  Total tourniquet time was approx 10 minutes.  Bacitracin ointment and a dressing was then applied, and checked that there was no significant bleeding after 5 minutes.   Patient tolerated the procedure well.   Assessment & Plan:   Problem List Items Addressed This Visit      Musculoskeletal and Integument   Ingrown left greater toenail - Primary    Consistent with ingrown Left great toenail medial aspect, no evidence of acute  infection or complication. Mild erythema/edema improved from recent warm salt soaks. - No prior toe nail removal  Plan: 1. Partial Left Great Toenail removal today - see procedure note for details. No complications, went well, toenail removed in one intact piece. Aftercare instructions given. 2. Return criteria given. Follow-up PRN         No orders of the defined types were placed in this encounter.      Follow up plan: Return in about 4 weeks (around 09/19/2015), or if symptoms worsen or fail to improve.  Nobie Putnam, [place], PGY-3

## 2015-08-22 NOTE — Patient Instructions (Signed)
Dear William [name], Thank you for coming in to clinic today.  1. Removed partial left great toenail today due to ingrown 2. No sign of infection 3. Follow after care sheet given  Please schedule a follow-up appointment with Dr. Ree Kida as needed within next 2-4 weeks if any concerns.  If you have any other questions or concerns, please feel free to call the clinic to contact me. You may also schedule an earlier appointment if necessary.  However, if your symptoms get significantly worse, please go to the Emergency Department to seek immediate medical attention.  William [name], [place]

## 2015-11-08 ENCOUNTER — Ambulatory Visit (INDEPENDENT_AMBULATORY_CARE_PROVIDER_SITE_OTHER): Payer: Commercial Managed Care - HMO | Admitting: Family Medicine

## 2015-11-08 ENCOUNTER — Encounter: Payer: Self-pay | Admitting: Family Medicine

## 2015-11-08 VITALS — BP 152/87 | HR 79 | Temp 97.7°F | Ht 67.0 in | Wt 170.4 lb

## 2015-11-08 DIAGNOSIS — M5441 Lumbago with sciatica, right side: Secondary | ICD-10-CM | POA: Diagnosis not present

## 2015-11-08 MED ORDER — BACLOFEN 10 MG PO TABS: 5.0000 mg | ORAL_TABLET | Freq: Three times a day (TID) | ORAL | Status: DC | PRN

## 2015-11-08 MED ORDER — TRAMADOL HCL 50 MG PO TABS: 50.0000 mg | ORAL_TABLET | Freq: Three times a day (TID) | ORAL | Status: DC | PRN

## 2015-11-08 NOTE — Patient Instructions (Signed)
See handout with back exercises Try baclofen (muscle relaxer) Tramadol for pain - might help you sleep  Return if no improvement in 1 week. Would consider referral to sports medicine then.  Be well, Dr. Ardelia Mems

## 2015-11-11 ENCOUNTER — Ambulatory Visit (INDEPENDENT_AMBULATORY_CARE_PROVIDER_SITE_OTHER): Payer: Commercial Managed Care - HMO | Admitting: Family Medicine

## 2015-11-11 ENCOUNTER — Encounter: Payer: Self-pay | Admitting: Family Medicine

## 2015-11-11 ENCOUNTER — Telehealth: Payer: Self-pay | Admitting: *Deleted

## 2015-11-11 VITALS — BP 127/53 | HR 75 | Temp 97.7°F | Ht 67.0 in | Wt 168.9 lb

## 2015-11-11 DIAGNOSIS — K055 Other periodontal diseases: Secondary | ICD-10-CM | POA: Diagnosis not present

## 2015-11-11 DIAGNOSIS — K068 Other specified disorders of gingiva and edentulous alveolar ridge: Secondary | ICD-10-CM

## 2015-11-11 HISTORY — DX: Other specified disorders of gingiva and edentulous alveolar ridge: K06.8

## 2015-11-11 LAB — CBC
HCT: 44 % (ref 39.0–52.0)
HEMOGLOBIN: 15 g/dL (ref 13.0–17.0)
MCH: 30.7 pg (ref 26.0–34.0)
MCHC: 34.1 g/dL (ref 30.0–36.0)
MCV: 90 fL (ref 78.0–100.0)
MPV: 8.3 fL — ABNORMAL LOW (ref 8.6–12.4)
Platelets: 368 10*3/uL (ref 150–400)
RBC: 4.89 MIL/uL (ref 4.22–5.81)
RDW: 13.2 % (ref 11.5–15.5)
WBC: 6.6 10*3/uL (ref 4.0–10.5)

## 2015-11-11 NOTE — Progress Notes (Addendum)
   Subjective:    Patient ID: William [name], male    DOB: May 09, 1941, 74 y.o.   MRN: [id number]  HPI 74 year old male presents for evaluation of bleeding gums.  Patient reports symptoms for approximately 8 months, can occur weeks apart, occurs between the right lower canine and first incisor, has bleeding sometimes with brushing, mostly has bleeding in the morning, no bleeding with flossing, he reports bleeding 6 times in the last 24 hours which is why he is here today for follow-up, he does report recently seen a dentist and needed work for cavities, no current tooth pain, no fevers, no other current bleeding noted  Meds: currently on asa 81 mg, no bleed thinners   Review of Systems See above    Objective:   Physical Exam Vitals: Reviewed Gen.: Pleasant male, no acute distress Mouth: No active bleeding, mild gingivitis resident, multiple dental caries present      Assessment & Plan:  Bleeding gums Patient presents for evaluation of bleeding gums over the past 24 hours. -No current active bleeding -Check PT, INR, PTT, and CBC to evaluate for coagulation deficiencies -Suspect that symptoms are related to gingivitis -If workup negative will recommend the patient follows up with dentist

## 2015-11-11 NOTE — Telephone Encounter (Signed)
Patient called stating he has bleed 5 times in the past 24 hours from between his teeth/gums.  Patient denies any injury to mouth, denies any pain.  He will go to bathroom and just spit out blood.  Precept with Dr. Ree Kida will see patient at 11:30 AM.  Derl Barrow, RN

## 2015-11-11 NOTE — Patient Instructions (Signed)
It was nice to see you today.  Dr. Ree Kida will call you with your results.

## 2015-11-11 NOTE — Progress Notes (Signed)
Patient ID: William [name], male   DOB: [birth year], 74 y.o.   MRN: [id number] Date of Visit: 11/08/2015   HPI:  Pt presents for a same day appointment to discuss pain in R leg.  On 11/2 was exercising and noted some pain in the R side of his low back. Has since continued to have pain. It radiates down the lateral aspect of his R leg. Worse with walking up stairs. Causing trouble sleeping. Has tried taking ibuprofen 600mg  without much help at all. Pain is an aching feeling. Has history of back surgery/fusion back in 2014, these symptoms are similar. Exercises every day but has had to back off due to this pain.  Denies having fever, saddle anesthesia, lower extremity weakness, or problems with stooling or urination.   ROS: See HPI  [place]: history of lumbar spinal stenosis, hyperlipidemia, emphysema  PHYSICAL EXAM: BP 152/87 mmHg  Pulse 79  Temp(Src) 97.7 F (36.5 C) (Oral)  Ht 5\' 7"  (1.702 m)  Wt 170 lb 6.4 oz (77.293 kg)  BMI 26.68 kg/m2 Gen: NAD, pleasant, cooperative Back: R lower back TTP in musculature Extremities: sensation intact over bilateral lower extremities. Full strength in bilateral lower extremities.   ASSESSMENT/PLAN:  1. R back pain with radiculopathy - likely due to muscle spasm. No red flags.  -trial of baclofen for muscle spasm -rx for tramadol for pain, especially for help with sleeping -handout given on back exercises -return in 1 week if not improving -consider referral to sports medicine (as patient very physically active)   FOLLOW UP: F/u in 1 week if not improving  [name], [place]

## 2015-11-11 NOTE — Assessment & Plan Note (Signed)
Patient presents for evaluation of bleeding gums over the past 24 hours. -No current active bleeding -Check PT, INR, PTT, and CBC to evaluate for coagulation deficiencies -Suspect that symptoms are related to gingivitis -If workup negative will recommend the patient follows up with dentist

## 2015-11-12 LAB — PROTIME-INR
INR: 1.05 (ref <1.50)
Prothrombin Time: 13.8 seconds (ref 11.6–15.2)

## 2015-11-12 LAB — APTT: APTT: 32 s (ref 24–37)

## 2015-11-14 ENCOUNTER — Telehealth: Payer: Self-pay | Admitting: Family Medicine

## 2015-11-14 NOTE — Telephone Encounter (Signed)
Notified patient of negative blood studies. Bleeding gums has improved over the past few days.

## 2015-11-28 ENCOUNTER — Ambulatory Visit (INDEPENDENT_AMBULATORY_CARE_PROVIDER_SITE_OTHER): Payer: Commercial Managed Care - HMO | Admitting: Family Medicine

## 2015-11-28 ENCOUNTER — Encounter: Payer: Self-pay | Admitting: Family Medicine

## 2015-11-28 VITALS — BP 134/89 | HR 66 | Temp 97.6°F | Ht 67.0 in | Wt 171.5 lb

## 2015-11-28 DIAGNOSIS — M25512 Pain in left shoulder: Secondary | ICD-10-CM

## 2015-11-28 DIAGNOSIS — L570 Actinic keratosis: Secondary | ICD-10-CM

## 2015-11-28 MED ORDER — METHYLPREDNISOLONE ACETATE 40 MG/ML IJ SUSP
40.0000 mg | Freq: Once | INTRAMUSCULAR | Status: AC
  Administered 2015-11-28: 40 mg via INTRA_ARTICULAR

## 2015-11-28 NOTE — Patient Instructions (Signed)
Please follow up with Dr. Ree Kida for the place on your face  Today we did a shoulder injection See handout with exercises Follow up if not improving in the next couple of weeks  Be well, Dr. Ardelia Mems  Impingement Syndrome, Rotator Cuff, Bursitis With Rehab Impingement syndrome is a condition that involves inflammation of the tendons of the rotator cuff and the subacromial bursa, that causes pain in the shoulder. The rotator cuff consists of four tendons and muscles that control much of the shoulder and upper arm function. The subacromial bursa is a fluid filled sac that helps reduce friction between the rotator cuff and one of the bones of the shoulder (acromion). Impingement syndrome is usually an overuse injury that causes swelling of the bursa (bursitis), swelling of the tendon (tendonitis), and/or a tear of the tendon (strain). Strains are classified into three categories. Grade 1 strains cause pain, but the tendon is not lengthened. Grade 2 strains include a lengthened ligament, due to the ligament being stretched or partially ruptured. With grade 2 strains there is still function, although the function may be decreased. Grade 3 strains include a complete tear of the tendon or muscle, and function is usually impaired. SYMPTOMS   Pain around the shoulder, often at the outer portion of the upper arm.  Pain that gets worse with shoulder function, especially when reaching overhead or lifting.  Sometimes, aching when not using the arm.  Pain that wakes you up at night.  Sometimes, tenderness, swelling, warmth, or redness over the affected area.  Loss of strength.  Limited motion of the shoulder, especially reaching behind the back (to the back pocket or to unhook bra) or across your body.  Crackling sound (crepitation) when moving the arm.  Biceps tendon pain and inflammation (in the front of the shoulder). Worse when bending the elbow or lifting. CAUSES  Impingement syndrome is often  an overuse injury, in which chronic (repetitive) motions cause the tendons or bursa to become inflamed. A strain occurs when a force is paced on the tendon or muscle that is greater than it can withstand. Common mechanisms of injury include: Stress from sudden increase in duration, frequency, or intensity of training.  Direct hit (trauma) to the shoulder.  Aging, erosion of the tendon with normal use.  Bony bump on shoulder (acromial spur). RISK INCREASES WITH:  Contact sports (football, wrestling, boxing).  Throwing sports (baseball, tennis, volleyball).  Weightlifting and bodybuilding.  Heavy labor.  Previous injury to the rotator cuff, including impingement.  Poor shoulder strength and flexibility.  Failure to warm up properly before activity.  Inadequate protective equipment.  Old age.  Bony bump on shoulder (acromial spur). PREVENTION   Warm up and stretch properly before activity.  Allow for adequate recovery between workouts.  Maintain physical fitness:  Strength, flexibility, and endurance.  Cardiovascular fitness.  Learn and use proper exercise technique. PROGNOSIS  If treated properly, impingement syndrome usually goes away within 6 weeks. Sometimes surgery is required.  RELATED COMPLICATIONS   Longer healing time if not properly treated, or if not given enough time to heal.  Recurring symptoms, that result in a chronic condition.  Shoulder stiffness, frozen shoulder, or loss of motion.  Rotator cuff tendon tear.  Recurring symptoms, especially if activity is resumed too soon, with overuse, with a direct blow, or when using poor technique. TREATMENT  Treatment first involves the use of ice and medicine, to reduce pain and inflammation. The use of strengthening and stretching exercises may help  reduce pain with activity. These exercises may be performed at home or with a therapist. If non-surgical treatment is unsuccessful after more than 6 months,  surgery may be advised. After surgery and rehabilitation, activity is usually possible in 3 months.  MEDICATION  If pain medicine is needed, nonsteroidal anti-inflammatory medicines (aspirin and ibuprofen), or other minor pain relievers (acetaminophen), are often advised.  Do not take pain medicine for 7 days before surgery.  Prescription pain relievers may be given, if your caregiver thinks they are needed. Use only as directed and only as much as you need.  Corticosteroid injections may be given by your caregiver. These injections should be reserved for the most serious cases, because they may only be given a certain number of times. HEAT AND COLD  Cold treatment (icing) should be applied for 10 to 15 minutes every 2 to 3 hours for inflammation and pain, and immediately after activity that aggravates your symptoms. Use ice packs or an ice massage.  Heat treatment may be used before performing stretching and strengthening activities prescribed by your caregiver, physical therapist, or athletic trainer. Use a heat pack or a warm water soak. SEEK MEDICAL CARE IF:   Symptoms get worse or do not improve in 4 to 6 weeks, despite treatment.  New, unexplained symptoms develop. (Drugs used in treatment may produce side effects.) EXERCISES  RANGE OF MOTION (ROM) AND STRETCHING EXERCISES - Impingement Syndrome (Rotator Cuff  Tendinitis, Bursitis) These exercises may help you when beginning to rehabilitate your injury. Your symptoms may go away with or without further involvement from your physician, physical therapist or athletic trainer. While completing these exercises, remember:   Restoring tissue flexibility helps normal motion to return to the joints. This allows healthier, less painful movement and activity.  An effective stretch should be held for at least 30 seconds.  A stretch should never be painful. You should only feel a gentle lengthening or release in the stretched tissue. STRETCH  - Flexion, Standing  Stand with good posture. With an underhand grip on your right / left hand, and an overhand grip on the opposite hand, grasp a broomstick or cane so that your hands are a little more than shoulder width apart.  Keeping your right / left elbow straight and shoulder muscles relaxed, push the stick with your opposite hand, to raise your right / left arm in front of your body and then overhead. Raise your arm until you feel a stretch in your right / left shoulder, but before you have increased shoulder pain.  Try to avoid shrugging your right / left shoulder as your arm rises, by keeping your shoulder blade tucked down and toward your mid-back spine. Hold for __________ seconds.  Slowly return to the starting position. Repeat __________ times. Complete this exercise __________ times per day. STRETCH - Abduction, Supine  Lie on your back. With an underhand grip on your right / left hand and an overhand grip on the opposite hand, grasp a broomstick or cane so that your hands are a little more than shoulder width apart.  Keeping your right / left elbow straight and your shoulder muscles relaxed, push the stick with your opposite hand, to raise your right / left arm out to the side of your body and then overhead. Raise your arm until you feel a stretch in your right / left shoulder, but before you have increased shoulder pain.  Try to avoid shrugging your right / left shoulder as your arm rises, by  keeping your shoulder blade tucked down and toward your mid-back spine. Hold for __________ seconds.  Slowly return to the starting position. Repeat __________ times. Complete this exercise __________ times per day. ROM - Flexion, Active-Assisted  Lie on your back. You may bend your knees for comfort.  Grasp a broomstick or cane so your hands are about shoulder width apart. Your right / left hand should grip the end of the stick, so that your hand is positioned "thumbs-up," as if you  were about to shake hands.  Using your healthy arm to lead, raise your right / left arm overhead, until you feel a gentle stretch in your shoulder. Hold for __________ seconds.  Use the stick to assist in returning your right / left arm to its starting position. Repeat __________ times. Complete this exercise __________ times per day.  ROM - Internal Rotation, Supine   Lie on your back on a firm surface. Place your right / left elbow about 60 degrees away from your side. Elevate your elbow with a folded towel, so that the elbow and shoulder are the same height.  Using a broomstick or cane and your strong arm, pull your right / left hand toward your body until you feel a gentle stretch, but no increase in your shoulder pain. Keep your shoulder and elbow in place throughout the exercise.  Hold for __________ seconds. Slowly return to the starting position. Repeat __________ times. Complete this exercise __________ times per day. STRETCH - Internal Rotation  Place your right / left hand behind your back, palm up.  Throw a towel or belt over your opposite shoulder. Grasp the towel with your right / left hand.  While keeping an upright posture, gently pull up on the towel, until you feel a stretch in the front of your right / left shoulder.  Avoid shrugging your right / left shoulder as your arm rises, by keeping your shoulder blade tucked down and toward your mid-back spine.  Hold for __________ seconds. Release the stretch, by lowering your healthy hand. Repeat __________ times. Complete this exercise __________ times per day. ROM - Internal Rotation   Using an underhand grip, grasp a stick behind your back with both hands.  While standing upright with good posture, slide the stick up your back until you feel a mild stretch in the front of your shoulder.  Hold for __________ seconds. Slowly return to your starting position. Repeat __________ times. Complete this exercise __________  times per day.  STRETCH - Posterior Shoulder Capsule   Stand or sit with good posture. Grasp your right / left elbow and draw it across your chest, keeping it at the same height as your shoulder.  Pull your elbow, so your upper arm comes in closer to your chest. Pull until you feel a gentle stretch in the back of your shoulder.  Hold for __________ seconds. Repeat __________ times. Complete this exercise __________ times per day. STRENGTHENING EXERCISES - Impingement Syndrome (Rotator Cuff Tendinitis, Bursitis) These exercises may help you when beginning to rehabilitate your injury. They may resolve your symptoms with or without further involvement from your physician, physical therapist or athletic trainer. While completing these exercises, remember:  Muscles can gain both the endurance and the strength needed for everyday activities through controlled exercises.  Complete these exercises as instructed by your physician, physical therapist or athletic trainer. Increase the resistance and repetitions only as guided.  You may experience muscle soreness or fatigue, but the pain or discomfort you  are trying to eliminate should never worsen during these exercises. If this pain does get worse, stop and make sure you are following the directions exactly. If the pain is still present after adjustments, discontinue the exercise until you can discuss the trouble with your clinician.  During your recovery, avoid activity or exercises which involve actions that place your injured hand or elbow above your head or behind your back or head. These positions stress the tissues which you are trying to heal. STRENGTH - Scapular Depression and Adduction   With good posture, sit on a firm chair. Support your arms in front of you, with pillows, arm rests, or on a table top. Have your elbows in line with the sides of your body.  Gently draw your shoulder blades down and toward your mid-back spine. Gradually  increase the tension, without tensing the muscles along the top of your shoulders and the back of your neck.  Hold for __________ seconds. Slowly release the tension and relax your muscles completely before starting the next repetition.  After you have practiced this exercise, remove the arm support and complete the exercise in standing as well as sitting position. Repeat __________ times. Complete this exercise __________ times per day.  STRENGTH - Shoulder Abductors, Isometric  With good posture, stand or sit about 4-6 inches from a wall, with your right / left side facing the wall.  Bend your right / left elbow. Gently press your right / left elbow into the wall. Increase the pressure gradually, until you are pressing as hard as you can, without shrugging your shoulder or increasing any shoulder discomfort.  Hold for __________ seconds.  Release the tension slowly. Relax your shoulder muscles completely before you begin the next repetition. Repeat __________ times. Complete this exercise __________ times per day.  STRENGTH - External Rotators, Isometric  Keep your right / left elbow at your side and bend it 90 degrees.  Step into a door frame so that the outside of your right / left wrist can press against the door frame without your upper arm leaving your side.  Gently press your right / left wrist into the door frame, as if you were trying to swing the back of your hand away from your stomach. Gradually increase the tension, until you are pressing as hard as you can, without shrugging your shoulder or increasing any shoulder discomfort.  Hold for __________ seconds.  Release the tension slowly. Relax your shoulder muscles completely before you begin the next repetition. Repeat __________ times. Complete this exercise __________ times per day.  STRENGTH - Supraspinatus   Stand or sit with good posture. Grasp a __________ weight, or an exercise band or tubing, so that your hand is  "thumbs-up," like you are shaking hands.  Slowly lift your right / left arm in a "V" away from your thigh, diagonally into the space between your side and straight ahead. Lift your hand to shoulder height or as far as you can, without increasing any shoulder pain. At first, many people do not lift their hands above shoulder height.  Avoid shrugging your right / left shoulder as your arm rises, by keeping your shoulder blade tucked down and toward your mid-back spine.  Hold for __________ seconds. Control the descent of your hand, as you slowly return to your starting position. Repeat __________ times. Complete this exercise __________ times per day.  STRENGTH - External Rotators  Secure a rubber exercise band or tubing to a fixed object (table, pole)  so that it is at the same height as your right / left elbow when you are standing or sitting on a firm surface.  Stand or sit so that the secured exercise band is at your uninjured side.  Bend your right / left elbow 90 degrees. Place a folded towel or small pillow under your right / left arm, so that your elbow is a few inches away from your side.  Keeping the tension on the exercise band, pull it away from your body, as if pivoting on your elbow. Be sure to keep your body steady, so that the movement is coming only from your rotating shoulder.  Hold for __________ seconds. Release the tension in a controlled manner, as you return to the starting position. Repeat __________ times. Complete this exercise __________ times per day.  STRENGTH - Internal Rotators   Secure a rubber exercise band or tubing to a fixed object (table, pole) so that it is at the same height as your right / left elbow when you are standing or sitting on a firm surface.  Stand or sit so that the secured exercise band is at your right / left side.  Bend your elbow 90 degrees. Place a folded towel or small pillow under your right / left arm so that your elbow is a few inches  away from your side.  Keeping the tension on the exercise band, pull it across your body, toward your stomach. Be sure to keep your body steady, so that the movement is coming only from your rotating shoulder.  Hold for __________ seconds. Release the tension in a controlled manner, as you return to the starting position. Repeat __________ times. Complete this exercise __________ times per day.  STRENGTH - Scapular Protractors, Standing   Stand arms length away from a wall. Place your hands on the wall, keeping your elbows straight.  Begin by dropping your shoulder blades down and toward your mid-back spine.  To strengthen your protractors, keep your shoulder blades down, but slide them forward on your rib cage. It will feel as if you are lifting the back of your rib cage away from the wall. This is a subtle motion and can be challenging to complete. Ask your caregiver for further instruction, if you are not sure you are doing the exercise correctly.  Hold for __________ seconds. Slowly return to the starting position, resting the muscles completely before starting the next repetition. Repeat __________ times. Complete this exercise __________ times per day. STRENGTH - Scapular Protractors, Supine  Lie on your back on a firm surface. Extend your right / left arm straight into the air while holding a __________ weight in your hand.  Keeping your head and back in place, lift your shoulder off the floor.  Hold for __________ seconds. Slowly return to the starting position, and allow your muscles to relax completely before starting the next repetition. Repeat __________ times. Complete this exercise __________ times per day. STRENGTH - Scapular Protractors, Quadruped  Get onto your hands and knees, with your shoulders directly over your hands (or as close as you can be, comfortably).  Keeping your elbows locked, lift the back of your rib cage up into your shoulder blades, so your mid-back  rounds out. Keep your neck muscles relaxed.  Hold this position for __________ seconds. Slowly return to the starting position and allow your muscles to relax completely before starting the next repetition. Repeat __________ times. Complete this exercise __________ times per day.  STRENGTH -  Scapular Retractors  Secure a rubber exercise band or tubing to a fixed object (table, pole), so that it is at the height of your shoulders when you are either standing, or sitting on a firm armless chair.  With a palm down grip, grasp an end of the band in each hand. Straighten your elbows and lift your hands straight in front of you, at shoulder height. Step back, away from the secured end of the band, until it becomes tense.  Squeezing your shoulder blades together, draw your elbows back toward your sides, as you bend them. Keep your upper arms lifted away from your body throughout the exercise.  Hold for __________ seconds. Slowly ease the tension on the band, as you reverse the directions and return to the starting position. Repeat __________ times. Complete this exercise __________ times per day. STRENGTH - Shoulder Extensors   Secure a rubber exercise band or tubing to a fixed object (table, pole) so that it is at the height of your shoulders when you are either standing, or sitting on a firm armless chair.  With a thumbs-up grip, grasp an end of the band in each hand. Straighten your elbows and lift your hands straight in front of you, at shoulder height. Step back, away from the secured end of the band, until it becomes tense.  Squeezing your shoulder blades together, pull your hands down to the sides of your thighs. Do not allow your hands to go behind you.  Hold for __________ seconds. Slowly ease the tension on the band, as you reverse the directions and return to the starting position. Repeat __________ times. Complete this exercise __________ times per day.  STRENGTH - Scapular Retractors  and External Rotators   Secure a rubber exercise band or tubing to a fixed object (table, pole) so that it is at the height as your shoulders, when you are either standing, or sitting on a firm armless chair.  With a palm down grip, grasp an end of the band in each hand. Bend your elbows 90 degrees and lift your elbows to shoulder height, at your sides. Step back, away from the secured end of the band, until it becomes tense.  Squeezing your shoulder blades together, rotate your shoulders so that your upper arms and elbows remain stationary, but your fists travel upward to head height.  Hold for __________ seconds. Slowly ease the tension on the band, as you reverse the directions and return to the starting position. Repeat __________ times. Complete this exercise __________ times per day.  STRENGTH - Scapular Retractors and External Rotators, Rowing   Secure a rubber exercise band or tubing to a fixed object (table, pole) so that it is at the height of your shoulders, when you are either standing, or sitting on a firm armless chair.  With a palm down grip, grasp an end of the band in each hand. Straighten your elbows and lift your hands straight in front of you, at shoulder height. Step back, away from the secured end of the band, until it becomes tense.  Step 1: Squeeze your shoulder blades together. Bending your elbows, draw your hands to your chest, as if you are rowing a boat. At the end of this motion, your hands and elbow should be at shoulder height and your elbows should be out to your sides.  Step 2: Rotate your shoulders, to raise your hands above your head. Your forearms should be vertical and your upper arms should be horizontal.  Hold  for __________ seconds. Slowly ease the tension on the band, as you reverse the directions and return to the starting position. Repeat __________ times. Complete this exercise __________ times per day.  STRENGTH - Scapular Depressors  Find a sturdy  chair without wheels, such as a dining room chair.  Keeping your feet on the floor, and your hands on the chair arms, lift your bottom up from the seat, and lock your elbows.  Keeping your elbows straight, allow gravity to pull your body weight down. Your shoulders will rise toward your ears.  Raise your body against gravity by drawing your shoulder blades down your back, shortening the distance between your shoulders and ears. Although your feet should always maintain contact with the floor, your feet should progressively support less body weight, as you get stronger.  Hold for __________ seconds. In a controlled and slow manner, lower your body weight to begin the next repetition. Repeat __________ times. Complete this exercise __________ times per day.    This information is not intended to replace advice given to you by your health care provider. Make sure you discuss any questions you have with your health care provider.   Document Released: 12/17/2005 Document Revised: 01/07/2015 Document Reviewed: 03/31/2009 [employer]vier Interactive Patient Education [employer].

## 2015-11-29 DIAGNOSIS — M25512 Pain in left shoulder: Secondary | ICD-10-CM | POA: Insufficient documentation

## 2015-11-29 NOTE — Assessment & Plan Note (Signed)
Encouraged patient to follow up with PCP for cryotherapy of lesion on L face.

## 2015-11-29 NOTE — Assessment & Plan Note (Signed)
Likely rotator cuff impingement syndrome based on history and exam. Discussed options for treatment with patient today, including conservative therapy with shoulder exercises versus subacromial corticosteroid injection. He elected to proceed with injection today, and will do exercises. Shoulder injected without difficult. Handout given with exercises. Follow up with PCP in several weeks for skin lesion on face, also to reevaluate for improvement in shoulder.

## 2015-11-29 NOTE — Progress Notes (Signed)
Date of Visit: 11/28/2015   HPI:  Patient presents to discuss left shoulder pain.   Has had pain in L shoudler for several months. Intermittently occurs based on his activity. Notices when he does benchpresses or raises his arm over his head. Has tried NSAIDs, tramadol, tylenol, and muscle relaxer without relief. No known injury to shoulder. No prior shoulder surgeries. Did have rotator cuff issues in R shoulder some years ago which improved with physical therapy.  Patient also requests rough area on his face frozen off, states PCP has done this for him previously.  ROS: See HPI.  [place]: history of emphysema, hyperlipidemia, spinal stenosis  PHYSICAL EXAM: BP 134/89 mmHg  Pulse 66  Temp(Src) 97.6 F (36.4 C) (Oral)  Ht 5\' 7"  (1.702 m)  Wt 171 lb 8 oz (77.792 kg)  BMI 26.85 kg/m2 Gen: NAD, pleasant, cooperative HEENT: NCAT Skin: rough 0.5cm area on L temple/cheek consistent with actinic keratosis Extremities: L shoulder able to passively & actively raise above 90 degrees, although with some pain. + neers and empty can on L. Shoulder nontender to palpation.   PROCEDURE NOTE:  After informed written consent was obtained, patient was seated on exam table. Left shoulder was prepped with alcohol swab. Utilizing posterior subacromial approach, patient's left shoulder was injected intraarticularly into the subacromial space with mixture of 1cc of depomedrol 40mg /mL and 4cc of 1% lidocaine without epinephrine. Bandaid applied. Patient tolerated the procedure well without immediate complications.     ASSESSMENT/PLAN:  Left shoulder pain Likely rotator cuff impingement syndrome based on history and exam. Discussed options for treatment with patient today, including conservative therapy with shoulder exercises versus subacromial corticosteroid injection. He elected to proceed with injection today, and will do exercises. Shoulder injected without difficult. Handout given with exercises. Follow  up with PCP in several weeks for skin lesion on face, also to reevaluate for improvement in shoulder.  AK (actinic keratosis) Encouraged patient to follow up with PCP for cryotherapy of lesion on L face.   FOLLOW UP: F/u in several weeks with PCP for above issues.  [place]. Ardelia Mems, [place]

## 2015-12-20 ENCOUNTER — Ambulatory Visit (INDEPENDENT_AMBULATORY_CARE_PROVIDER_SITE_OTHER): Payer: Commercial Managed Care - HMO | Admitting: Family Medicine

## 2015-12-20 ENCOUNTER — Ambulatory Visit (HOSPITAL_COMMUNITY)
Admission: RE | Admit: 2015-12-20 | Discharge: 2015-12-20 | Disposition: A | Discharge status: Home / Self Care | Payer: Commercial Managed Care - HMO | Source: Ambulatory Visit | Attending: Family Medicine | Admitting: Family Medicine

## 2015-12-20 ENCOUNTER — Encounter: Payer: Self-pay | Admitting: Family Medicine

## 2015-12-20 VITALS — BP 124/67 | HR 63 | Temp 97.6°F | Ht 67.0 in | Wt 173.0 lb

## 2015-12-20 DIAGNOSIS — L570 Actinic keratosis: Secondary | ICD-10-CM

## 2015-12-20 DIAGNOSIS — M25551 Pain in right hip: Secondary | ICD-10-CM | POA: Diagnosis not present

## 2015-12-20 DIAGNOSIS — M25512 Pain in left shoulder: Secondary | ICD-10-CM | POA: Insufficient documentation

## 2015-12-20 MED ORDER — MELOXICAM 7.5 MG PO TABS: 7.5000 mg | ORAL_TABLET | Freq: Every day | ORAL | Status: DC

## 2015-12-20 NOTE — Assessment & Plan Note (Addendum)
Actinic keratosis of the left face. - destruction performed using cryotherapy as discussed in the procedure note.

## 2015-12-20 NOTE — Patient Instructions (Signed)
It was nice to see you today.  Shoulder pain - likely inflammation of your shoulder muscles, stop heavy lifting at the gym for 2 weeks, continue home exercises prescribed by Dr. Ardelia Mems (only do 3 times per week). Attempt trial of Mobic (pain medication). Check xrays today. If not better in a few weeks call Dr. Ree Kida and he will refer you to sports medicine for an ultrasound.  Area on face - cryotherapy today  Right hip pain - likely trochanteric bursitis (inflammation of the bony prominance), steroid injection today, avoid exercises that cause you pain.

## 2015-12-20 NOTE — Assessment & Plan Note (Signed)
Patient presents for pain of his lateral right hip that is clinically consistent with greater trochanteric bursitis. - Steroid injection performed as per procedure note - Attempt trial of  Mobic 7.5 mg daily -return precautions given

## 2015-12-20 NOTE — Progress Notes (Signed)
   Subjective:    Patient ID: William [name], male    DOB: Dec 14, 1941, 74 y.o.   MRN: [id number]  HPI 74 year old male presents for follow-up of multiple medical issues.  Left cheek lesion - lesion present for many months, was told to previous visit to follow-up with PCP, no bleeding, no itching  Right hip pain - present for the past weeks to months, worse with weight lifting, previously seen and evaluated at the [facility] clinic and given baclofen and tramadol, these medications have provided minimal relief of symptoms, symptoms primarily located over the lateral right hip at this time without radiation into the groin, no low back pain, no weakness or numbness in the lower extremity  Left shoulder pain - present for the past few months, not present at rest, exacerbated by certain movements when weightlifting especially overhead movements, has attempted multiple over the  Counter medications including Advil/ibuprofen/Tylenol without relief. No neck pain, no neck stiffness, no numbness or weakness of the left upper extremity.  Social - nonsmoker   Review of Systems  Constitutional: Negative for fever, chills and fatigue.  Respiratory: Negative for cough and shortness of breath.   Gastrointestinal: Negative for nausea and diarrhea.       Objective:   Physical Exam Vitals: Reviewed Gen.: Pleasant male, no acute distress Skin: Actinic keratosis present over the left cheek, no other AK present on the face MSK: Right hip- tenderness over the greater trochanter; left shoulder - able to forward flexion to 160, able to abduction  170, posterior tenderness, rotator cuff testing including empty can/lift off/resisted external rotation were within normal limits, positive Hawkins, negative Neer's; lumbar spine - no paraspinal or midline tenderness Neuro: Strength 5/5 grossly in the lower extremities, sensation to light-touch grossly intact in lower extremities  Procedure note: Right greater trochanteric  bursitis steroid injection. Verbal and written consent was obtained. The risks and benefits of the procedure were discussed with the patient. Using a 5 mL syringe and 1 1/2 inch 25-gauge needle 40 mg of Depo-Medrol and 4 mL of lidocaine without epinephrine were injected into the area of most tenderness over the right lateral hip. Patient tolerated the procedure well. No complications. Band-Aid applied.  Procedure note: Cryotherapy of face. Verbal and written consent were obtained. The risks and benefits of the procedure were discussed with the patient. Using the cryotherapy gun 3 rounds of cryotherapy were used to destroy the lesion. Patient tolerated the procedure well. No complications.     Assessment & Plan:  AK (actinic keratosis) Actinic keratosis of the left face. - destruction performed using cryotherapy as discussed in the procedure note.  Left shoulder pain Patient presents for follow-up of left shoulder pain. Clinically I agree with previous note from Dr. Ardelia Mems that his symptoms are likely related to rotator cuff impingement/subacromial bursitis. Rotator cuff testing was intact. - X-ray of left shoulder to evaluate bony structure - Activity restriction discussed with the patient - He is to continue home PT exercises only 3 times per week - He is to apply  Ice to the affected area 1-2 times per day - If symptoms persist will consider referral to sports medicine  Lateral pain of right hip Patient presents for pain of his lateral right hip that is clinically consistent with greater trochanteric bursitis. - Steroid injection performed as per procedure note - Attempt trial of  Mobic 7.5 mg daily -return precautions given

## 2015-12-20 NOTE — Assessment & Plan Note (Signed)
Patient presents for follow-up of left shoulder pain. Clinically I agree with previous note from Dr. Ardelia Mems that his symptoms are likely related to rotator cuff impingement/subacromial bursitis. Rotator cuff testing was intact. - X-ray of left shoulder to evaluate bony structure - Activity restriction discussed with the patient - He is to continue home PT exercises only 3 times per week - He is to apply  Ice to the affected area 1-2 times per day - If symptoms persist will consider referral to sports medicine

## 2016-03-19 ENCOUNTER — Encounter: Payer: Self-pay | Admitting: Family Medicine

## 2016-03-19 ENCOUNTER — Ambulatory Visit (INDEPENDENT_AMBULATORY_CARE_PROVIDER_SITE_OTHER): Payer: Commercial Managed Care - HMO | Admitting: Family Medicine

## 2016-03-19 VITALS — BP 135/73 | HR 68 | Temp 97.7°F | Ht 67.0 in | Wt 178.1 lb

## 2016-03-19 DIAGNOSIS — J208 Acute bronchitis due to other specified organisms: Secondary | ICD-10-CM | POA: Diagnosis not present

## 2016-03-19 MED ORDER — BENZONATATE 100 MG PO CAPS: 100.0000 mg | ORAL_CAPSULE | Freq: Two times a day (BID) | ORAL | Status: DC | PRN

## 2016-03-19 MED ORDER — MUCINEX DM 30-600 MG PO TB12: 1.0000 | ORAL_TABLET | Freq: Two times a day (BID) | ORAL | Status: DC | PRN

## 2016-03-19 NOTE — Patient Instructions (Signed)
Mucinex DM brand or equivalent  Dextromethorphan 30mg   Guafenesin 600mg 

## 2016-03-19 NOTE — Progress Notes (Signed)
   Subjective:    Patient ID: William [name], male    DOB: 1941-11-29, 75 y.o.   MRN: [id number]  HPI  Patient presents for Same Day Appointment  CC: cough  # Cough:  Son got sick around 3/6. Wife got sick last week. He has been sick for about 10 days  Started with cough, diarrhea.   Gets into coughing spells  Usually works out at [employer] but hasn't been because of the cough  Mostly dry, but feels the congestion just hard to get out.   Son is getting better but not yet over it  Got flu shot  Has tried daytime/night time equate cough and cold, alka seltzer plus -- neither really helps.  ROS: no fevers, no chills, nausea, vomiting.   PMH: possible diagnosis of emphysema in the past but not on any medicines Social Hx: former smoker, 45 pack year history  Review of Systems   See HPI for ROS.   Past medical history, surgical, family, and social history reviewed and updated in the EMR as appropriate.  Objective:  BP 135/73 mmHg  Pulse 68  Temp(Src) 97.7 F (36.5 C) (Oral)  Ht 5\' 7"  (1.702 m)  Wt 178 lb 1.6 oz (80.786 kg)  BMI 27.89 kg/m2  SpO2 97% Vitals and nursing note reviewed  General: no apparent distress  HEENT: normal conjunctiva and sclera, no rhinorrhea, no posterior pharyngeal erythema, moist mucous membranes.  CV: normal rate, regular rhythm, no murmurs, rubs or gallop.  Resp: mild end expiratory wheeze both upper lung fields, no crackles or rhonchi, normal effort and rate.  Assessment & Plan:  1. Viral bronchitis Likely viral with multiple contact exposure with similar illness. Recommended higher dosing of mucinex DM, trial of tessalon. No evidence of pneumonia. Return precautions given. Hand hygiene and stay hydrated. Follow up as needed.

## 2016-06-06 ENCOUNTER — Ambulatory Visit (INDEPENDENT_AMBULATORY_CARE_PROVIDER_SITE_OTHER): Payer: Commercial Managed Care - HMO | Admitting: Family Medicine

## 2016-06-06 ENCOUNTER — Encounter: Payer: Self-pay | Admitting: Family Medicine

## 2016-06-06 VITALS — BP 136/77 | HR 76 | Temp 98.6°F | Wt 171.0 lb

## 2016-06-06 DIAGNOSIS — B070 Plantar wart: Secondary | ICD-10-CM | POA: Diagnosis not present

## 2016-06-06 NOTE — Patient Instructions (Signed)
Froze your wart today Follow up with me or Dr. Ree Kida in 2-3 weeks to see if we need to repeat this treatment  Be well, Dr. Ardelia Mems     Plantar Warts Warts are small growths on the skin. They can occur on various areas of the body. When they occur on the underside (sole) of the foot, they are called plantar warts. Plantar warts often occur in groups, with several small warts around a larger growth. They tend to develop over areas of pressure, such as the heel or the ball of the foot. Most warts are not painful, and they usually do not cause problems. However, plantar warts may cause pain when you walk because pressure is applied to them. Warts often go away on their own in time. Various treatments may be done if needed. Sometimes, warts go away and then they come back again. CAUSES Plantar warts are caused by a type of virus that is called human papillomavirus (HPV). HPV attacks a break in the skin of the foot. Walking barefoot can lead to exposure to the virus. These warts may spread to other areas of the sole. They spread to other areas of the body only through direct contact. RISK FACTORS Plantar warts are more likely to develop in:  People who are 76-17 years of age.  People who use public showers or locker rooms.  People who have a weakened body defense system (immune system). SYMPTOMS Plantar warts may be flat or slightly raised. They may grow into the deeper layers of skin or rise above the surface of the skin. Most plantar warts have a rough surface. They may cause pain when you use your foot to support your body weight. DIAGNOSIS A plantar wart can usually be diagnosed from its appearance. In some cases, a tissue sample may be removed (biopsy) to be looked at under a microscope. TREATMENT In many cases, warts do not need treatment. Without treatment, they often go away over a period of many months to a couple years. If treatment is needed, options may include:  Applying  medicated solutions, creams, or patches to the wart. These may be over-the-counter or prescription medicines that make the skin soft so that layers will gradually shed away. In many cases, the medicine is applied one or two times per day and covered with a bandage.  Putting duct tape over the top of the wart (occlusion). You will leave the tape in place for as long as told by your health care provider, then you will replace it with a new strip of tape. This is done until the wart goes away.  Freezing the wart with liquid nitrogen (cryotherapy).  Burning the wart with:  Laser treatment.  An electrified probe (electrocautery).  Injection of a medicine (Candida antigen) into the wart to help the body's immune system to fight off the wart.  Surgery to remove the wart. HOME CARE INSTRUCTIONS  Apply medicated creams or solutions only as told by your health care provider. This may involve:  Soaking the affected area in warm water.  Removing the top layer of softened skin before you apply the medicine. A pumice stone works well for removing the tissue.  Applying a bandage over the affected area after you apply the medicine.  Repeating the process daily or as told by your health care provider.  Do not scratch or pick at a wart.  Wash your hands after you touch a wart.  If a wart is painful, try applying a bandage with  a hole in the middle over the wart. The helps to take pressure off the wart.  Keep all follow-up visits as told by your health care provider. This is important. PREVENTION Take these actions to help prevent warts:  Wear shoes and socks. Change your socks daily.  Keep your feet clean and dry.  Check your feet regularly.  Avoid direct contact with warts on other people. SEEK MEDICAL CARE IF:  Your warts do not improve after treatment.  You have redness, swelling, or pain at the site of a wart.  You have bleeding from a wart that does not stop with light  pressure.  You have diabetes and you develop a wart.   This information is not intended to replace advice given to you by your health care provider. Make sure you discuss any questions you have with your health care provider.   Document Released: 03/08/2004 Document Revised: 09/07/2015 Document Reviewed: 03/14/2015 Elsevier Interactive Patient Education [employer].

## 2016-06-06 NOTE — Progress Notes (Signed)
Date of Visit: 06/06/2016   HPI:  Patient presents for a same day appointment to discuss foot pain.  Has pain on bottom of L foot at fifth metatarsal. Present for 5 days. No redness, swelling, or fever. Feels like something is in his foot that is causing pain when he bears weight. Eating and drinking well. Walking okay.  ROS: See HPI  [place]: history of hyperlipidemia, prostatic hyperplasia, lumbar spinal stenosis, R foot plantar wart, AK  PHYSICAL EXAM: BP 136/77 mmHg  Pulse 76  Temp(Src) 98.6 F (37 C) (Oral)  Wt 171 lb (77.565 kg)  SpO2 97% Gen: NAD, pleasant, cooperative Foot: L foot with small plantar wart beneath distal end of fifth metatarsal.   PROCEDURE NOTE:  After informed consent obtained, skin overlying left foot plantar wart gently pared down using #15 blade. Area then sprayed with liquid nitrogen for approximately 5 seconds seconds in order to create visibly frozen area with appx 28mm of surrounding tissue. Blanched frozen area returned to normal erythema quickly in approximately 20 seconds. Treatment repeated x 2 to achieve a total of around 60 seconds of freeze time. Bandage applied. Patient tolerated the procedure well.   ASSESSMENT/PLAN:  1. Plantar wart: pared down & frozen today. Follow up in 2-3 weeks to eval for need to repeat treatment. Handout given.  FOLLOW UP: Follow up in 2-3 weeks for plantar wart  [name], [place]

## 2016-06-28 ENCOUNTER — Encounter: Payer: Self-pay | Admitting: Family Medicine

## 2016-06-28 ENCOUNTER — Ambulatory Visit (INDEPENDENT_AMBULATORY_CARE_PROVIDER_SITE_OTHER): Payer: Commercial Managed Care - HMO | Admitting: Family Medicine

## 2016-06-28 VITALS — BP 123/70 | HR 69 | Temp 97.2°F | Ht 67.0 in | Wt 173.0 lb

## 2016-06-28 DIAGNOSIS — E785 Hyperlipidemia, unspecified: Secondary | ICD-10-CM

## 2016-06-28 DIAGNOSIS — L570 Actinic keratosis: Secondary | ICD-10-CM | POA: Diagnosis not present

## 2016-06-28 DIAGNOSIS — B070 Plantar wart: Secondary | ICD-10-CM | POA: Diagnosis not present

## 2016-06-28 DIAGNOSIS — Z1211 Encounter for screening for malignant neoplasm of colon: Secondary | ICD-10-CM

## 2016-06-28 DIAGNOSIS — M25512 Pain in left shoulder: Secondary | ICD-10-CM

## 2016-06-28 LAB — COMPLETE METABOLIC PANEL WITH GFR
ALT: 14 U/L (ref 9–46)
AST: 24 U/L (ref 10–35)
Albumin: 3.9 g/dL (ref 3.6–5.1)
Alkaline Phosphatase: 72 U/L (ref 40–115)
BUN: 13 mg/dL (ref 7–25)
CHLORIDE: 102 mmol/L (ref 98–110)
CO2: 27 mmol/L (ref 20–31)
CREATININE: 0.73 mg/dL (ref 0.70–1.18)
Calcium: 9.1 mg/dL (ref 8.6–10.3)
GFR, Est African American: >89 mL/min (ref >=60)
GFR, Est Non African American: >89 mL/min (ref >=60)
GLUCOSE: 83 mg/dL (ref 65–99)
Potassium: 4.3 mmol/L (ref 3.5–5.3)
SODIUM: 138 mmol/L (ref 135–146)
TOTAL PROTEIN: 6.9 g/dL (ref 6.1–8.1)
Total Bilirubin: 0.4 mg/dL (ref 0.2–1.2)

## 2016-06-28 LAB — CBC WITH DIFFERENTIAL/PLATELET
Basophils Absolute: 49 cells/uL (ref 0–200)
Basophils Relative: 1 %
EOS ABS: 294 {cells}/uL (ref 15–500)
EOS PCT: 6 %
HCT: 43.3 % (ref 38.5–50.0)
Hemoglobin: 14.4 g/dL (ref 13.2–17.1)
Lymphocytes Relative: 31 %
Lymphs Abs: 1519 cells/uL (ref 850–3900)
MCH: 30.1 pg (ref 27.0–33.0)
MCHC: 33.3 g/dL (ref 32.0–36.0)
MCV: 90.6 fL (ref 80.0–100.0)
MPV: 8.8 fL (ref 7.5–12.5)
Monocytes Absolute: 539 cells/uL (ref 200–950)
Monocytes Relative: 11 %
NEUTROS ABS: 2499 {cells}/uL (ref 1500–7800)
Neutrophils Relative %: 51 %
PLATELETS: 314 10*3/uL (ref 140–400)
RBC: 4.78 MIL/uL (ref 4.20–5.80)
RDW: 13.7 % (ref 11.0–15.0)
WBC: 4.9 10*3/uL (ref 3.8–10.8)

## 2016-06-28 LAB — LIPID PANEL
CHOLESTEROL: 162 mg/dL (ref 125–200)
HDL: 45 mg/dL (ref >=40)
LDL Cholesterol: 92 mg/dL (ref <130)
Total CHOL/HDL Ratio: 3.6 Ratio (ref <=5.0)
Triglycerides: 123 mg/dL (ref <150)
VLDL: 25 mg/dL (ref <30)

## 2016-06-28 NOTE — Assessment & Plan Note (Signed)
Left temple AK and right temple AK -cryotherapy performed per procedure note

## 2016-06-28 NOTE — Patient Instructions (Signed)
  Plantar wart - freeze today, return if the warts continues to hurt  Actinic keratosis (skin lesions) - freeze today  Left shoulder pain - I am concerned that you may have an injury of your labrum, I would like you to see the sports medicine doctors. [address] [place], [place] [address] [phone]

## 2016-06-28 NOTE — Assessment & Plan Note (Signed)
Patient has had improvement of symptoms with trial of rest however still relatively symptomatic. Exam today consistent with possible labral etiology. Rotator cuff testing unremarkable.  -referral to sports medicine for further work up.

## 2016-06-28 NOTE — Progress Notes (Signed)
   Subjective:    Patient ID: William [name], male    DOB: 02-17-1941, 75 y.o.   MRN: [id number]  HPI 75 y/o male presents for evaluation of plantar wart.  Plantar wart left foot - new, painful, would like cryotherapy  AK - bilateral temple, would like cryotherapy  Left shoulder pain - improved after taking rest for a few weeks, no pain at rest, feels posterior "inside pain", worse with abduction of arm; has been able to complete shoulder lifting exercises in gym, pain is 3/10 only with abduction  Social - nonsmoker, runs 3-4 times per week   Review of Systems  Constitutional: Negative for fever and chills.  Respiratory: Negative for cough.   Cardiovascular: Negative for chest pain.       Objective:   Physical Exam BP 123/70 mmHg  Pulse 69  Temp(Src) 97.2 F (36.2 C) (Oral)  Ht 5\' 7"  (1.702 m)  Wt 173 lb (78.472 kg)  BMI 27.09 kg/m2 Gen: pleasant male, NAD Skin: AK of left temple, AK of right temple, no other AK/skin lesion noted of face; plantar wart of lateral left foot MSK: left shoulder - to tenderness, ROM forward flexion and abduction was full (painful with full abduction), empty can strength 5/5, ER strength 5/5, lift off test strength 5/5; Obrien's test positive for pain when hand is pronated (no pain with supination)  Xray left shoulder 12/20/15 - AC arthritis  Procedure Note:Cryotherapy of Left Foot Plantar Wart Verbal Consent Obtained. Risks and Benefits Discussed. Three rounds of cryotherapy lasting 5-7 seconds. No complications. Patient tolerated well.   Procedure Note: Cryotherapy of multiple AK Verbal Consent Obtained. Risks and Benefits Discussed. Two rounds of cryotherapy lasting 3-5 seconds of two separate lesions. No complications. Patient tolerated well.     Assessment & Plan:  AK (actinic keratosis) Left temple AK and right temple AK -cryotherapy performed per procedure note  Plantar wart of left foot Plantar wart of left foot. -cryotherapy  performed  Left shoulder pain Patient has had improvement of symptoms with trial of rest however still relatively symptomatic. Exam today consistent with possible labral etiology. Rotator cuff testing unremarkable.  -referral to sports medicine for further work up.

## 2016-06-28 NOTE — Assessment & Plan Note (Signed)
Plantar wart of left foot. -cryotherapy performed

## 2016-06-29 ENCOUNTER — Encounter: Payer: Self-pay | Admitting: Family Medicine

## 2016-08-09 ENCOUNTER — Encounter: Payer: Self-pay | Admitting: Family Medicine

## 2016-08-09 ENCOUNTER — Ambulatory Visit (INDEPENDENT_AMBULATORY_CARE_PROVIDER_SITE_OTHER): Payer: Commercial Managed Care - HMO | Admitting: Family Medicine

## 2016-08-09 DIAGNOSIS — Z0000 Encounter for general adult medical examination without abnormal findings: Secondary | ICD-10-CM

## 2016-08-09 DIAGNOSIS — R591 Generalized enlarged lymph nodes: Secondary | ICD-10-CM

## 2016-08-09 DIAGNOSIS — R599 Enlarged lymph nodes, unspecified: Secondary | ICD-10-CM | POA: Insufficient documentation

## 2016-08-09 DIAGNOSIS — M25551 Pain in right hip: Secondary | ICD-10-CM | POA: Diagnosis not present

## 2016-08-09 DIAGNOSIS — B070 Plantar wart: Secondary | ICD-10-CM | POA: Diagnosis not present

## 2016-08-09 LAB — CBC WITH DIFFERENTIAL/PLATELET
Basophils Absolute: 0 cells/uL (ref 0–200)
Basophils Relative: 0 %
EOS PCT: 2 %
Eosinophils Absolute: 140 cells/uL (ref 15–500)
HCT: 44.1 % (ref 38.5–50.0)
Hemoglobin: 14.4 g/dL (ref 13.2–17.1)
LYMPHS PCT: 28 %
Lymphs Abs: 1960 cells/uL (ref 850–3900)
MCH: 29.9 pg (ref 27.0–33.0)
MCHC: 32.7 g/dL (ref 32.0–36.0)
MCV: 91.5 fL (ref 80.0–100.0)
MPV: 9.3 fL (ref 7.5–12.5)
Monocytes Absolute: 840 cells/uL (ref 200–950)
Monocytes Relative: 12 %
NEUTROS PCT: 58 %
Neutro Abs: 4060 cells/uL (ref 1500–7800)
PLATELETS: 269 10*3/uL (ref 140–400)
RBC: 4.82 MIL/uL (ref 4.20–5.80)
RDW: 13.9 % (ref 11.0–15.0)
WBC: 7 10*3/uL (ref 3.8–10.8)

## 2016-08-09 NOTE — Patient Instructions (Addendum)
Mr. Batra, it was a pleasure seeing you in clinic today! Congratulations on keeping up with exercise and staying healthy. We discussed your neck pain, foot wart and hip pain.  1. Neck pain: likely due to some inflammation of a lymph node or a just a viral infection  -we will check some of your blood work and call you with the results  2. Foot wart -we used cryotherapy to freeze a new wart between your 4th and 5th toes -follow up with [place] if it does not improve or worsens  3. Hip pain -we think this is due to a bit of inflammation in the hip joint that may be caused by activity/exercise -stretch before workouts and apply heat when sore  Return to clinic or contact us if symptoms worsen.

## 2016-08-09 NOTE — Assessment & Plan Note (Signed)
Consistent with greater trochanteric bursitis. Discussed option of steroid injection. Patient elected for conservative therapy with ice/rest.

## 2016-08-09 NOTE — Assessment & Plan Note (Signed)
Enlarged lymph node under left mandible causing pain/irritation. Likely reactive in nature. -check CBC with dif to rule out malignancy -discussed conservative treatment and monitoring

## 2016-08-09 NOTE — Progress Notes (Signed)
Subjective:     Patient ID: ANTHONIE SCHIESSER, male   DOB: 12-Aug-1941, 75 y.o.   MRN: [id number]  Mr. Wryder Keizer is a 75 y.o. year old male presents for preventative visit and annual physical.   Acute Concerns 1. Neck Pain -Patient reports a 3 day history of 6 L neck pain that radiates to the L face. He endorses associated headache, chills, and dizziness. He denies fever, N/V, vision changes, muscle pain, tingling/tremors, weakness.  2. Foot Wart -New foot wart present on sole of L foot between 4th and 5th digits. Pt reports pain when running on foot or when wearing shoes with thin soles. Similar to previous wart that was removed using cryotherapy treatment.   3. Hip Pain  Intermittent R sided hip pain that sometimes radiates to the gluteal muscles and lateral back.History of trochanteric bursitis, for which he received a hip injection to relieve pain last year. Of note, patient reports that he often feels the pain when he is exercising his legs.  4. Enlarged Prostate Denies any urinary incontinence. Only reports that it takes him longer to urinate because he has to wait for urine to finish "dribbling"  Diet: Well-balanced with appropriate fruits and vegetables  Exercise: Exercises frequently throughout the week.     Review of Systems   Pertinent positives and negatives per HPI    Objective:   Physical Exam  Constitutional: He is oriented to person, place, and time. He appears well-developed and well-nourished. No distress.  Healthy-appearing older gentleman  Eyes: EOM are normal. Pupils are equal, round, and reactive to light. No scleral icterus.  Neck: Normal range of motion.  Possible solitary swollen lymph node on L side below mandible  Cardiovascular: Normal rate, regular rhythm and normal heart sounds.   No murmur heard. Pulmonary/Chest: Effort normal and breath sounds normal. He has no wheezes. He exhibits no tenderness.  Musculoskeletal: Normal range of motion. He  exhibits no edema or tenderness.  Straight leg test negative  Lymphadenopathy:    He has cervical adenopathy.  Neurological: He is alert and oriented to person, place, and time.  Skin: plantar wart of left foot  Procedure Note: Risk of benefits of cryotherapy discussed. Three rounds of cryotherapy performed on left foot. Patient tolerated well. No complications.     Assessment:     Mr. Gabel Ramierz is a 75 y.o. year old male presents for preventative visit and annual physical.     Plan:  Enlarged lymph nodes Enlarged lymph node under left mandible causing pain/irritation. Likely reactive in nature. -check CBC with dif to rule out malignancy -discussed conservative treatment and monitoring  Plantar wart of left foot New plantar wart of left foot at base of 4th/5th toes. -we used cryotherapy to freeze a new wart between 4th and 5th toes -continue to observe  Lateral pain of right hip Consistent with greater trochanteric bursitis. Discussed option of steroid injection. Patient elected for conservative therapy with ice/rest.   Health care maintenance Up to date on immunizations. Patient has stool cards to screen for colon cancer Prostate not fully evaluated due to multiple other acute issues Encouraged continued regular exercise and healthy diet.

## 2016-08-09 NOTE — Progress Notes (Deleted)
Subjective:     Patient ID: William [name], male   DOB: 1941-05-25, 75 y.o.   MRN: [id number]  [name] is a 75 y.o. year old male presents for preventative visit and annual physical.   Acute Concerns 1. Neck Pain -Patient reports a 3 day history of 6 L neck pain that radiates to the L face. He endorses associated headache, chills, and dizziness. He denies fever, N/V, vision changes, muscle pain, tingling/tremors, weakness.  2. Foot Wart -New foot wart present on sole of L foot between 4th and 5th digits. Pt reports pain when running on foot or when wearing shoes with thin soles. Similar to previous wart that was removed using cryotherapy treatment.   3. Hip Pain  Intermittent R sided hip pain that sometimes radiates to the gluteal muscles and lateral back.History of trochanteric bursitis, for which he received a hip injection to relieve pain last year. Of note, patient reports that he often feels the pain when he is exercising his legs.  4. Enlarged Prostate Denies any urinary incontinence. Only reports that it takes him longer to urinate because he has to wait for urine to finish "dribbling"  Diet: Well-balanced with appropriate fruits and vegetables  Exercise: Exercises frequently throughout the week.     Review of Systems Pertinent positives and negatives per HPI    Objective:   Physical Exam  Constitutional: He is oriented to person, place, and time. He appears well-developed and well-nourished. No distress.  Healthy-appearing older gentleman  Eyes: EOM are normal. Pupils are equal, round, and reactive to light. No scleral icterus.  Neck: Normal range of motion.  Possible solitary swollen lymph node on L side below mandible  Cardiovascular: Normal rate, regular rhythm and normal heart sounds.   No murmur heard. Pulmonary/Chest: Effort normal and breath sounds normal. He has no wheezes. He exhibits no tenderness.  Musculoskeletal: Normal range of motion. He  exhibits no edema or tenderness.  Straight leg test negative  Lymphadenopathy:    He has cervical adenopathy.  Neurological: He is alert and oriented to person, place, and time.       Assessment:     [name] is a 75 y.o. year old male presents for preventative visit and annual physical.     Plan:     1. Neck pain: likely due to some inflammation of a lymph node or a just a viral infection  -CBC w diff  2. Foot wart -we used cryotherapy to freeze a new wart between 4th and 5th toes -continue to observe  3. Hip pain -likely due to inflammation in the hip joint that may be caused by activity/exercise -stretch before workouts and apply heat when sore

## 2016-08-09 NOTE — Assessment & Plan Note (Signed)
New plantar wart of left foot at base of 4th/5th toes. -we used cryotherapy to freeze a new wart between 4th and 5th toes -continue to observe

## 2016-08-09 NOTE — Assessment & Plan Note (Signed)
Up to date on immunizations. Patient has stool cards to screen for colon cancer Prostate not fully evaluated due to multiple other acute issues Encouraged continued regular exercise and healthy diet.

## 2016-08-09 NOTE — Progress Notes (Deleted)
75 y.o. year old male presents for preventative visit and annual physical.   Acute Concerns:  Diet:  Exercise:  Sexual History:  POA/Living Will:  Social:  Social History   Social History  . Marital status: Married    Spouse name: Alleen Borne  . Number of children: 3  . Years of education: 10   Occupational History  . Retired- Ambulance person    Social History Main Topics  . Smoking status: Former Smoker    Packs/day: 1.50    Years: 30.00    Quit date: 04/02/1986  . Smokeless tobacco: Never Used  . Alcohol use 0.0 oz/week     Comment: Rare beer  . Drug use: No  . Sexual activity: Yes    Partners: Female   Other Topics Concern  . Not on file   Social History Narrative   Health Care POA:    Emergency Contact: wife, Lowis Mashek, [phone]   End of Life Plan: Pt does not have, not interested at this time   Who lives with you: Lives with 3rd wife and two sons   Daughter by first marriage who lives in [place], 2 grandsons   Any pets: dog and parakeets   Diet: Patient has a varied diet and has recently tried increasing f/v.    Exercise: Patient goings to [facility] 4-5 days a week, bike, strength training   Seatbelts: Patient reports wearing seatbelt occasionally   Sun Exposure/Protection: Patient does not wear daily sun protection.   Hobbies: Running, exercising                   Immunization: Immunization History  Administered Date(s) Administered  . Influenza Split 10/03/2011, 10/21/2012  . Influenza,inj,Quad PF,36+ Mos 09/25/2013  . Influenza-Unspecified 10/03/2014, 10/23/2015  . Pneumococcal Conjugate-13 03/10/2015  . Pneumococcal Polysaccharide-23 04/29/2007  . Td 04/03/2011  . Zoster 04/29/2007    Cancer Screening:  Colonoscopy: never completed, last FOBT was in 03/2015 (Stool cards provided at last visit)  Prostate:    Physical Exam: VITALS: Reviewed GEN: Pleasant male, NAD HEENT: Normocephalic, PERRL, EOMI, no scleral  icterus, bilateral TM pearly grey, nasal septum midline, MMM, uvula midline, no anterior or posterior lymphadenopathy, no thyromegaly CARDIAC:RRR, S1 and S2 present, no murmur, no heaves/thrills RESP: CTAB, normal effort ABD: soft, no tenderness, normal bowel sounds  EXT: No edema, 2+ radial and DP pulses SKIN: no rash  Reviewed labs from previous 3 months.   ASSESSMENT & PLAN: 75 y.o. male presents for annual preventative exam. Please see problem specific assessment and plan.   No problem-specific Assessment & Plan notes found for this encounter.

## 2016-08-10 ENCOUNTER — Telehealth: Payer: Self-pay | Admitting: Family Medicine

## 2016-08-10 NOTE — Telephone Encounter (Signed)
Called patient to inform him of normal CBC. Neck pain unchanged from yesterday.   Prostate - reports some dribbling at end of going, good flow, no nocturia, no incomplete voiding, we discussed that if symptoms worsen may need to recheck prostate, patient agreeable.

## 2016-09-14 ENCOUNTER — Ambulatory Visit (INDEPENDENT_AMBULATORY_CARE_PROVIDER_SITE_OTHER): Payer: Commercial Managed Care - HMO | Admitting: Family Medicine

## 2016-09-14 ENCOUNTER — Encounter: Payer: Self-pay | Admitting: Family Medicine

## 2016-09-14 DIAGNOSIS — Z23 Encounter for immunization: Secondary | ICD-10-CM | POA: Diagnosis not present

## 2016-09-14 NOTE — Progress Notes (Signed)
Subjective:     Patient ID: William [name], male   DOB: 04-Nov-1941, 75 y.o.   MRN: [id number]  HPI Left arm: C/O throbbing feeling on his left arm muscle which started last night. Associated with some discomfort. He felt this symptoms throughout the night till this morning. He is currently not having symptoms. He stated he could see the throbbing visibly when ever he is having his symptoms. He denies numbness, no tingling, no trauma to his left arm. He does lift weight at the Gym initially 5 times a day but lately has been lifting it only once weekly.  Foot Wart: C/O wart on his left foot sole, painful with pressure on it. He stated he had wart on his right foot which was removed by freezing. Here for assessment.  Current Outpatient Prescriptions on File Prior to Visit  Medication Sig Dispense Refill  . aspirin EC 81 MG tablet Take 81 mg by mouth daily.    . Multiple Vitamin (MULTIVITAMIN WITH MINERALS) TABS tablet Take 1 tablet by mouth daily.    . Omega-3 Fatty Acids (FISH OIL CONCENTRATE PO) Take 1 tablet by mouth.    . benzonatate (TESSALON) 100 MG capsule Take 1-2 capsules (100-200 mg total) by mouth 2 (two) times daily as needed for cough. (Patient not taking: Reported on 09/14/2016) 30 capsule 0  . Dextromethorphan-Guaifenesin (MUCINEX DM) 30-600 MG TB12 Take 1 tablet by mouth every 12 (twelve) hours as needed. (Patient not taking: Reported on 09/14/2016) 28 each 0  . docusate sodium (COLACE) 100 MG capsule Take 100 mg by mouth daily.     Marland Kitchen KETOCONAZOLE, TOPICAL, 1 % SHAM Wash your scalp/hair daily with shampoo (Patient not taking: Reported on 09/14/2016) 200 mL 1  . meloxicam (MOBIC) 7.5 MG tablet Take 1 tablet (7.5 mg total) by mouth daily. (Patient not taking: Reported on 09/14/2016) 30 tablet 1   No current facility-administered medications on file prior to visit.    Past Medical History:  Diagnosis Date  . Dyslipidemia (high LDL; low HDL)   . Numbness and tingling of right leg       Review of Systems  Respiratory: Negative.   Cardiovascular: Negative.   Gastrointestinal: Negative.   Musculoskeletal:       Left arm discomfort. Left foot sole problem.  All other systems reviewed and are negative.      Vitals:   09/14/16 1013  BP: 137/78  Pulse: (!) 58  Temp: 98 F (36.7 C)  TempSrc: Oral  Weight: 170 lb (77.1 kg)    Objective:   Physical Exam  Constitutional: He appears well-developed and well-nourished. No distress.  Cardiovascular: Normal rate, regular rhythm and normal heart sounds.   No murmur heard. Pulmonary/Chest: Effort normal and breath sounds normal. No respiratory distress. He has no wheezes.  Abdominal: Soft. Bowel sounds are normal. He exhibits no distension and no mass. There is no tenderness.  Musculoskeletal: Normal range of motion. He exhibits no edema, tenderness or deformity.       Right shoulder: Normal.       Left shoulder: Normal. He exhibits normal range of motion, no tenderness, no swelling, no crepitus, no deformity and no spasm.       Left elbow: He exhibits normal range of motion, no swelling and no deformity. No tenderness found.       Feet:  Nursing note and vitals reviewed.      Assessment:     Left arm: Foot Callus    Plan:  1. Left arm exam looks very benign.     No tenderness, no swelling or erythema.     Patient reassured he unlikely has a clot since he was  Mostly worried about this.     Monitor for now with arm rest and massaging.     Call soon if symptoms reoccurs. Will consider U/S then.     He agreed with plan.  2. What he called a wart looks more like a callus.     I recommended podiatry referral for management.     He stated he will watch for now since it is still pretty small.     F/U with PCP soon for reassessment.  See PCP for other health concern and health maintenance issues.

## 2016-09-14 NOTE — Patient Instructions (Signed)
It was nice seeing you today. Your arm exam looks normal; call soon if still having symptoms or worsening, I might consider U/S then. Your foot lesion is a callus. Please let me know if you will like referral to a podiatrist.

## 2016-10-26 ENCOUNTER — Ambulatory Visit: Payer: Commercial Managed Care - HMO | Admitting: Family Medicine

## 2017-02-07 ENCOUNTER — Encounter: Payer: Self-pay | Admitting: Family Medicine

## 2017-02-07 ENCOUNTER — Ambulatory Visit (INDEPENDENT_AMBULATORY_CARE_PROVIDER_SITE_OTHER): Payer: Medicare HMO | Admitting: Family Medicine

## 2017-02-07 DIAGNOSIS — R0609 Other forms of dyspnea: Secondary | ICD-10-CM | POA: Insufficient documentation

## 2017-02-07 DIAGNOSIS — J309 Allergic rhinitis, unspecified: Secondary | ICD-10-CM

## 2017-02-07 DIAGNOSIS — R040 Epistaxis: Secondary | ICD-10-CM | POA: Diagnosis not present

## 2017-02-07 DIAGNOSIS — R202 Paresthesia of skin: Secondary | ICD-10-CM

## 2017-02-07 DIAGNOSIS — R200 Anesthesia of skin: Secondary | ICD-10-CM

## 2017-02-07 DIAGNOSIS — R0602 Shortness of breath: Secondary | ICD-10-CM | POA: Diagnosis not present

## 2017-02-07 LAB — BASIC METABOLIC PANEL WITH GFR
BUN: 13 mg/dL (ref 7–25)
CHLORIDE: 106 mmol/L (ref 98–110)
CO2: 24 mmol/L (ref 20–31)
Calcium: 9.3 mg/dL (ref 8.6–10.3)
Creat: 0.9 mg/dL (ref 0.70–1.18)
GFR, EST NON AFRICAN AMERICAN: 84 mL/min (ref >=60)
GFR, Est African American: >89 mL/min (ref >=60)
GLUCOSE: 89 mg/dL (ref 65–99)
Potassium: 4.3 mmol/L (ref 3.5–5.3)
Sodium: 141 mmol/L (ref 135–146)

## 2017-02-07 LAB — CBC
HEMATOCRIT: 43 % (ref 38.5–50.0)
HEMOGLOBIN: 14.4 g/dL (ref 13.2–17.1)
MCH: 30.8 pg (ref 27.0–33.0)
MCHC: 33.5 g/dL (ref 32.0–36.0)
MCV: 92.1 fL (ref 80.0–100.0)
MPV: 8.9 fL (ref 7.5–12.5)
Platelets: 258 10*3/uL (ref 140–400)
RBC: 4.67 MIL/uL (ref 4.20–5.80)
RDW: 13.8 % (ref 11.0–15.0)
WBC: 4.7 10*3/uL (ref 3.8–10.8)

## 2017-02-07 MED ORDER — CETIRIZINE HCL 10 MG PO TABS: 10.0000 mg | ORAL_TABLET | Freq: Every day | ORAL | 1 refills | Status: DC

## 2017-02-07 MED ORDER — GABAPENTIN 300 MG PO CAPS: 300.0000 mg | ORAL_CAPSULE | Freq: Every day | ORAL | 2 refills | Status: DC

## 2017-02-07 NOTE — Progress Notes (Signed)
   Subjective:    Patient ID: William [name], male    DOB: 09/29/1941, 77 y.o.   MRN: [id number]  HPI 76 y/o male presents for evaluation of shortness of breath.  Dyspnea Present over the past years, reports decreased exercise compliance, no symptoms at rest however occasional has shortness of breath even when walking, denies fevers/chills/cough/orthopnea/syncope/chest pain.   Right foot numbness History of back surgery X2 (lumbar disk surgery 2008 at L3-L5; lumbar fusion in 2014), numbness over dorsum of foot consistent with previous numbness in leg prior to surgery, no current back pain or radiation down legs, no bladder/bowel incontinence, no weakness, able to lift weight and run  Rhinorrhea/Epistaxis Few months, chronic runny nose, recent epistaxis from left nare, no cough, no hemoptysis, no hematemesis, no facial tenderness.   Bilateral arm numbness Only at night when laying on the affected side, resolves after a few minutes when changing sides, no associates weakness/numbness during the day   Social Nonsmoker  Review of Systems See above    Objective:   Physical Exam BP 104/60   Pulse 62   Temp 97.8 F (36.6 C) (Oral)   Ht 5\' 7"  (1.702 m)   Wt 174 lb 12.8 oz (79.3 kg)   SpO2 97%   BMI 27.38 kg/m  Gen: pleasant male, NAD HEENT: normocephalic, PERRL, no scleral icterus, nasal septum midline, boggy nasal turbinates, no active epistaxis, MMM, uvula midline, neck supple, no adenopathy Cardiac: RRR, S1 and S2 present, no murmur Resp: CTAB, normal effort MSK: cervical - no tenderness, ROM full, negative Spurlings; Lumbar - no midline tenderness Neuro: CN 2-12 intact, strength 5/5 in all extremities, sensation to light touch intact in all extremities, sensation to monofilament intact in bilateral feet Ext: trace edema of LE extremities  No previous Echocardiogram     Assessment & Plan:  Right leg numbness Right foot numbness. Likely related to Lumbar DDD and previous  spinal surgeries. No acute reg flag signs/symtpoms. -trial of gabapentin  Allergic rhinitis Symptoms consistent with allergic rhinitis. -trial of Zyrtec  Epistaxis Intermittent. No active bleeding. Suspect due to allergic rhinitis/cold weather. -treat allergic rhinitis (see separate plan) -encouraged nasal saline drops and humidifier  Shortness of breath One year history of worsening sob with exertion in a very active male. Differential is large. Doubt lung issues as exam unremarkable. CHF and metabolic cause are also in the differential. -start with CBC and CMP -check Echo to evaluate heart function  Bilateral arm numbness and tingling while sleeping Exam unremarkable. Suspect related to sleep position. -trial of Gabapentin

## 2017-02-07 NOTE — Progress Notes (Signed)
76 yo male presents to [facility] due to concerns of difficulty breathing during and after minimal, moderate and strenuous exercise.  Dyspnea Reports difficulty breathing during and after minimal, moderate and strenuous exercise. Patient states that his symptoms resolve with rest. He reports that it seems that he is not able to exercise as long as before due to his shortness of breath and that his family notices him trying to catch his breath after walking around the house.  Reports being able to run around 6 miles last year compared to currently tolerating around 70 minutes on the treadmill. Denies chest pain, orthopnea, no fevers, productive cough, or hemoptysis. Denies syncopal episode, dizziness or lightheadedness.   Numbness on dorsum of right foot  History of two back surgeries. Reports mild numbness of dorsum of right foot and a sensation of his shoes and socks feeling too tight. Patient states that it is a similar feeling to what he felt in his right leg prior to his back surgery. Denies back pain or radiation down the back of his thighs. Denies urinary or bowel incontinence. Denies weakness or unsteady gait.   Rhinorrhea/Epistaxis Reports rhinorrhea for the past two months and epistaxis of the left nare when blowing his nose. Denies cough, sore throat or fevers. Denies productive cough or hemoptysis. Denies profuse epistaxis. Denies hematuria, hematochezia or melena.   He has been taking diphenhydramine every day for the rhinorrhea and uses a humidifier at home.   Physical Exam  Gen: Well developed pleasant male. NAD.  Neuro: Alert and interacting appropriately. CN II-XII intact  HEENT: Normocephalic. PERRLA. EOMI. TM grey without exudate or erythema. No bulging or retraction. Nasal dorsum midline. No epistaxis. Mild erythema of left nare. Oropharynx clear without erythema or exudate. No thyromegaly. No LAD. Trachea midline.  [id number] rate and rhythm. S1 and S2 present. No murmurs, rubs or  gallops. No JVD. Pulm: Lungs CTAB. No wheezing, rhonchi, rales. Normal respiratory effort.  Abd: Soft and non-tender. Ext: Warm and well perfused. 5/5 strength in bilateral upper and lower extremities. Sensation to monofilament intact in bilateral lower extremities. No foot ulcers noted.

## 2017-02-07 NOTE — Patient Instructions (Signed)
It was nice to see you today.   Shortness of breath - we will check labs today and an echocardiogram (ultrasound of your heart).  Runny nose - start Zyrtec (allergy medication) daily  Nose bleeds - use humidifier at home during the night, may use saline drops (get at the pharmacy) to moisturize the nose  Arm numbness - likely related to arthritis  Right leg numbness - likely due to previous back surgeries, start Gabapentin (nerve medication) 300 mg nightly

## 2017-02-08 ENCOUNTER — Encounter: Payer: Self-pay | Admitting: Family Medicine

## 2017-02-08 DIAGNOSIS — R200 Anesthesia of skin: Secondary | ICD-10-CM | POA: Insufficient documentation

## 2017-02-08 DIAGNOSIS — R040 Epistaxis: Secondary | ICD-10-CM | POA: Insufficient documentation

## 2017-02-08 DIAGNOSIS — R202 Paresthesia of skin: Secondary | ICD-10-CM

## 2017-02-08 NOTE — Assessment & Plan Note (Signed)
Right foot numbness. Likely related to Lumbar DDD and previous spinal surgeries. No acute reg flag signs/symtpoms. -trial of gabapentin

## 2017-02-08 NOTE — Assessment & Plan Note (Signed)
Exam unremarkable. Suspect related to sleep position. -trial of Gabapentin

## 2017-02-08 NOTE — Assessment & Plan Note (Signed)
Symptoms consistent with allergic rhinitis. -trial of Zyrtec 

## 2017-02-08 NOTE — Assessment & Plan Note (Signed)
Intermittent. No active bleeding. Suspect due to allergic rhinitis/cold weather. -treat allergic rhinitis (see separate plan) -encouraged nasal saline drops and humidifier

## 2017-02-08 NOTE — Assessment & Plan Note (Signed)
One year history of worsening sob with exertion in a very active male. Differential is large. Doubt lung issues as exam unremarkable. CHF and metabolic cause are also in the differential. -start with CBC and CMP -check Echo to evaluate heart function

## 2017-02-14 ENCOUNTER — Ambulatory Visit (HOSPITAL_COMMUNITY)
Admission: RE | Admit: 2017-02-14 | Discharge: 2017-02-14 | Disposition: A | Discharge status: Home / Self Care | Payer: Medicare HMO | Source: Ambulatory Visit | Attending: Family Medicine | Admitting: Family Medicine

## 2017-02-14 DIAGNOSIS — E785 Hyperlipidemia, unspecified: Secondary | ICD-10-CM | POA: Insufficient documentation

## 2017-02-14 DIAGNOSIS — Z87891 Personal history of nicotine dependence: Secondary | ICD-10-CM | POA: Insufficient documentation

## 2017-02-14 DIAGNOSIS — I340 Nonrheumatic mitral (valve) insufficiency: Secondary | ICD-10-CM | POA: Insufficient documentation

## 2017-02-14 DIAGNOSIS — I071 Rheumatic tricuspid insufficiency: Secondary | ICD-10-CM | POA: Diagnosis not present

## 2017-02-14 DIAGNOSIS — R0602 Shortness of breath: Secondary | ICD-10-CM | POA: Diagnosis not present

## 2017-02-14 NOTE — Progress Notes (Signed)
  Echocardiogram 2D Echocardiogram has been performed.  Tresa Res 02/14/2017, 9:36 AM

## 2017-02-18 ENCOUNTER — Telehealth: Payer: Self-pay | Admitting: Family Medicine

## 2017-02-18 MED ORDER — ALBUTEROL SULFATE HFA 108 (90 BASE) MCG/ACT IN AERS: 2.0000 | INHALATION_SPRAY | Freq: Four times a day (QID) | RESPIRATORY_TRACT | 2 refills | Status: DC | PRN

## 2017-02-18 NOTE — Telephone Encounter (Signed)
Spoke to patient about Echo results. Normal EF.  Patient previously smoked 1-2 ppd for 30 years, has spirometry previously (unable to locate in [facility]), Had "trouble getting air out", will send in Albuterol to be used prn sob, will also have patient come back for repeat Spirometry with pharmacy team. Patient counseled to not use inhaler on day of exam.   RN staff - please call to schedule pharmacy visit

## 2017-02-20 NOTE — Telephone Encounter (Signed)
Patient has appointment scheduled with Dr. Valentina Lucks for 2/22.

## 2017-02-21 ENCOUNTER — Ambulatory Visit (INDEPENDENT_AMBULATORY_CARE_PROVIDER_SITE_OTHER): Payer: Medicare HMO | Admitting: Pharmacist

## 2017-02-21 ENCOUNTER — Encounter: Payer: Self-pay | Admitting: Pharmacist

## 2017-02-21 DIAGNOSIS — J449 Chronic obstructive pulmonary disease, unspecified: Secondary | ICD-10-CM | POA: Diagnosis not present

## 2017-02-21 MED ORDER — FLUTICASONE FUROATE-VILANTEROL 100-25 MCG/INH IN AEPB: 1.0000 | INHALATION_SPRAY | Freq: Every day | RESPIRATORY_TRACT | 0 refills | Status: DC

## 2017-02-21 NOTE — Progress Notes (Signed)
   S:    Patient arrives ambulating unassisted and in good spirits.  He presents for lung function evaluation.  Patient was referred on 02/18/17 by Dr. Ree Kida and was last seen by Primary Care Provider on 02/08/17. Patient reports breathing has been worsening over the past 6-7 months. He works out often (cross-training/weight-lifting) and has noticed audible wheezing and a limitation in his exercise due to his breathing. He has never been diagnosed with asthma, even as a child.   Patient reports last dose of asthma medications was 2/20 (2 days ago).   Patient reports enjoys an active lifestyle. He reports ~20 hours/week of heavy exercise. He has noticed a decrease in lung function with worsening SOB over the past 6-7 months. He was recently started on albuterol PRN and reports a notable improvement after one dose. He is extremely motivated to maintain his active lifestyle.   His smoking history (quit at age 79) is notable for 30 years at 2 packs/day. He worries his lungs may have been injured due to his smoking history.   O: See "scanned report" or Documentation Flowsheet (discrete results - PFTs) for Spirometry results. Patient provided good effort while attempting spirometry.    CAT COPD score 4/35 and MMRC <2.   Albuterol Neb  Lot# [id number]   Exp. 08/2018  A/P: Spirometry evaluation reveals Mild restrictive lung disease. Post nebulized albuterol tx revealed significant reversibility.  And suggestive of Asthma- COPD overlap syndrome.  Compared to PFTs in 2013 - today's results suggest an improved PFT result which was higher, most likely related to training/conditioning.  Patient has been experiencing shortness of breath and wheezing with exercise for approximately 6 months He recently started taking albuterol PRN, which he reports has helped after one use so far.  He reported improved symptoms following nebulization.  Initiated Breo Elipta 100/25 inhalation daily. Educated patient on purpose, proper  use, potential adverse effects including the risk of esophageal candidiasis and need to rinse mouth after each use.  Reviewed results of pulmonary function tests. Patient provided with and educated on use of peak flow meter. Patient verbalized understanding of results and education.  Written pt instructions provided.  F/U Clinic visit 28 days with Dr Ree Kida (PCP).    Total time in face to face counseling 30 minutes.  Patient seen with Maylon Cos, PharmD PGY1 Resident.

## 2017-02-21 NOTE — Assessment & Plan Note (Signed)
Spirometry evaluation reveals Mild restrictive lung disease. Post nebulized albuterol tx revealed significant reversibility.  And suggestive of Asthma- COPD overlap syndrome.  Compared to PFTs in 2013 - today's results suggest an improved PFT result which was higher, most likely related to training/conditioning.  Patient has been experiencing shortness of breath and wheezing with exercise for approximately 6 months He recently started taking albuterol PRN, which he reports has helped after one use so far.  He reported improved symptoms following nebulization.  Initiated Breo Elipta 100/25 inhalation daily. Educated patient on purpose, proper use, potential adverse effects including the risk of esophageal candidiasis and need to rinse mouth after each use.  Reviewed results of pulmonary function tests. Patient provided with and educated on use of peak flow meter. Patient verbalized understanding of results and education.  Written pt instructions provided.  F/U Clinic visit 28 days with Dr Ree Kida (PCP).

## 2017-02-21 NOTE — Patient Instructions (Addendum)
Continue to use your albuterol inhaler as needed for "rescue".   Start Breo Ellipta inhaler. Use one inhalation daily.   Wash your mouth out after each use of your Breo Ellipta inhaler.   Follow-up with Dr. Ree Kida in a month.

## 2017-02-25 NOTE — Progress Notes (Signed)
Patient ID: William [name], male   DOB: May 30, 1941, 76 y.o.   MRN: [id number] Reviewed: Agree with Dr. Graylin Shiver documentation and management.

## 2017-03-12 ENCOUNTER — Encounter: Payer: Self-pay | Admitting: Family Medicine

## 2017-03-19 NOTE — Progress Notes (Signed)
   Subjective:    Patient ID: William [name], male    DOB: 01/25/1941, 76 y.o.   MRN: [id number]  HPI 76 y/o male presents for follow up of dyspnea with exertion.   Dyspnea Had spirometry last month (see below). Started on Breo Elipta 100/25 daily. Also prescribed albuterol prn.  Reports significant improvement of symptoms (90% better), has gotten back to running, still can't run as far or as hard as previously, also continue to weight list multiple days per week. Using albuterol less frequently, would like to go up on Breo to see if this provides complete resolution of symptoms.   Right foot numbness and Bilateral arm numbness with sleeping.  In the setting of Lumbar DDD and previous spinal surgeries. Stated on Gabapentin at last visit. Resolved with Gabapentin.   HM Due for colon cancer screening   Review of Systems     Objective:   Physical Exam BP 120/70   Pulse (!) 57   Temp 97.6 F (36.4 C) (Oral)   Ht 5\' 7"  (1.702 m)   Wt 171 lb 6.4 oz (77.7 kg)   SpO2 98%   BMI 26.85 kg/m   Gen: pleasant male, NAD Cardiac: RRR, S1 and S2 present, no murmur Resp: CTAB, normal effort  Echo 02/14/17 EF 60-65%, normal wall motion  Spirometry Mild obstructive disease. FEV1/FVC ratio in mid 60's, improved with bronchodilator     Assessment & Plan:  Asthma-COPD overlap syndrome ([place]) Obstructive pattern on recent Spirometry reading. Symptoms improved with daily Breo. -As not back to baseline will increase Breo to 200-25 daily.  -Continue Albuterol prn  Bilateral arm numbness and tingling while sleeping Resolved with Gabapentin.   Right leg numbness Resolved with Gabapentin.

## 2017-03-21 ENCOUNTER — Ambulatory Visit (INDEPENDENT_AMBULATORY_CARE_PROVIDER_SITE_OTHER): Payer: Medicare HMO | Admitting: Family Medicine

## 2017-03-21 ENCOUNTER — Encounter: Payer: Self-pay | Admitting: Family Medicine

## 2017-03-21 DIAGNOSIS — R200 Anesthesia of skin: Secondary | ICD-10-CM

## 2017-03-21 DIAGNOSIS — R202 Paresthesia of skin: Secondary | ICD-10-CM | POA: Diagnosis not present

## 2017-03-21 DIAGNOSIS — J449 Chronic obstructive pulmonary disease, unspecified: Secondary | ICD-10-CM | POA: Diagnosis not present

## 2017-03-21 MED ORDER — FLUTICASONE FUROATE-VILANTEROL 200-25 MCG/INH IN AEPB: 1.0000 | INHALATION_SPRAY | Freq: Every day | RESPIRATORY_TRACT | 2 refills | Status: DC

## 2017-03-21 NOTE — Patient Instructions (Signed)
It was nice to see you today.  Increase the Breo to 200-25 mg daily. Continue Albuterol as needed.   Return in 1-2 months.

## 2017-03-21 NOTE — Assessment & Plan Note (Signed)
Obstructive pattern on recent Spirometry reading. Symptoms improved with daily Breo. -As not back to baseline will increase Breo to 200-25 daily.  -Continue Albuterol prn

## 2017-03-21 NOTE — Assessment & Plan Note (Signed)
Resolved with Gabapentin.

## 2017-05-09 ENCOUNTER — Other Ambulatory Visit: Payer: Self-pay | Admitting: Family Medicine

## 2017-05-09 DIAGNOSIS — R200 Anesthesia of skin: Secondary | ICD-10-CM

## 2017-05-09 NOTE — Telephone Encounter (Signed)
He needs his Gabapentin refilled to [facility] on [address].  Thank you  (pt. # [phone])

## 2017-05-10 ENCOUNTER — Other Ambulatory Visit: Payer: Self-pay | Admitting: Family Medicine

## 2017-05-10 DIAGNOSIS — R200 Anesthesia of skin: Secondary | ICD-10-CM

## 2017-05-10 MED ORDER — GABAPENTIN 300 MG PO CAPS: 300.0000 mg | ORAL_CAPSULE | Freq: Every day | ORAL | 1 refills | Status: DC

## 2017-05-10 NOTE — Telephone Encounter (Signed)
RN staff - please call in Gabapentin 300 mg QHS, dispense #90, refill #1. Thanks.

## 2017-05-14 MED ORDER — GABAPENTIN 300 MG PO CAPS: 300.0000 mg | ORAL_CAPSULE | Freq: Every day | ORAL | 1 refills | Status: DC

## 2017-05-14 NOTE — Addendum Note (Signed)
Addended by: Derl Barrow on: 05/14/2017 08:43 AM   Modules accepted: Orders

## 2017-05-29 NOTE — Progress Notes (Signed)
   Subjective:    Patient ID: William [name], male    DOB: 03-19-1941, 76 y.o.   MRN: [id number]  HPI 76 y/o male presents for follow up of Dyspnea.  Astham-COPD Overlap Syndrome Breo increased to 200-25 daily at last visit. Some improvement, still not back to baseline, runs an average to 2-7 miles per day (has to run slowly, if runs fast has worsening symptoms). Uses Ventolin if he exerts himself (using mostly daily) - does use before runs and this helps symptoms.  Denies chest pain, no swelling  HM Due for Colon Cancer Screening (still has screening cards at home that need to be completed).   Allergic Rhinitis Improved with Flonase  Social Former Smoker   Review of Systems See above    Objective:   Physical Exam BP 112/68   Pulse 62   Temp 97.7 F (36.5 C) (Oral)   Ht 5\' 7"  (1.702 m)   Wt 171 lb (77.6 kg)   SpO2 97%   BMI 26.78 kg/m   Gen: pleasant male, NAD Cardiac: RRR, S1 and S2 present, no murmur Resp: CTAB, normal effort      Assessment & Plan:  Allergic rhinitis Controlled with Flonase -continue current therapy  Asthma-COPD overlap syndrome ([name]) Improved however patient still with symptoms during exercise. -continue Breo -add Spiriva -continue prn Albuterol  Screening for colon cancer Patient encouraged to complete home stool cards as not yet completed.

## 2017-05-30 ENCOUNTER — Ambulatory Visit (INDEPENDENT_AMBULATORY_CARE_PROVIDER_SITE_OTHER): Payer: Medicare HMO | Admitting: Family Medicine

## 2017-05-30 ENCOUNTER — Encounter: Payer: Self-pay | Admitting: Family Medicine

## 2017-05-30 DIAGNOSIS — Z1211 Encounter for screening for malignant neoplasm of colon: Secondary | ICD-10-CM | POA: Diagnosis not present

## 2017-05-30 DIAGNOSIS — J449 Chronic obstructive pulmonary disease, unspecified: Secondary | ICD-10-CM

## 2017-05-30 DIAGNOSIS — J309 Allergic rhinitis, unspecified: Secondary | ICD-10-CM | POA: Diagnosis not present

## 2017-05-30 MED ORDER — FLUTICASONE PROPIONATE 50 MCG/ACT NA SUSP: 2.0000 | Freq: Every day | NASAL | 6 refills | Status: DC

## 2017-05-30 MED ORDER — TIOTROPIUM BROMIDE MONOHYDRATE 18 MCG IN CAPS: 18.0000 ug | ORAL_CAPSULE | Freq: Every day | RESPIRATORY_TRACT | 5 refills | Status: DC

## 2017-05-30 MED ORDER — FLUTICASONE FUROATE-VILANTEROL 200-25 MCG/INH IN AEPB: 1.0000 | INHALATION_SPRAY | Freq: Every day | RESPIRATORY_TRACT | 2 refills | Status: DC

## 2017-05-30 NOTE — Assessment & Plan Note (Signed)
Controlled with Flonase -continue current therapy

## 2017-05-30 NOTE — Assessment & Plan Note (Signed)
Patient encouraged to complete home stool cards as not yet completed.

## 2017-05-30 NOTE — Assessment & Plan Note (Signed)
Improved however patient still with symptoms during exercise. -continue Breo -add Spiriva -continue prn Albuterol

## 2017-05-30 NOTE — Patient Instructions (Addendum)
It was nice to see yo today.  Please schedule a Medicare Visit (Wellness Visit) on your way out today.   Start Spiriva (take daily in addition to the [place]). Continue Ventolin as needed.   Return in one month for a recheck of your breathing.

## 2017-06-25 ENCOUNTER — Ambulatory Visit: Payer: Medicare HMO | Admitting: *Deleted

## 2017-07-09 ENCOUNTER — Ambulatory Visit: Payer: Medicare HMO | Admitting: *Deleted

## 2017-07-10 ENCOUNTER — Encounter: Payer: Self-pay | Admitting: Family Medicine

## 2017-07-11 ENCOUNTER — Ambulatory Visit: Payer: Medicare HMO | Admitting: *Deleted

## 2017-07-16 ENCOUNTER — Encounter: Payer: Self-pay | Admitting: *Deleted

## 2017-07-16 ENCOUNTER — Ambulatory Visit (INDEPENDENT_AMBULATORY_CARE_PROVIDER_SITE_OTHER): Payer: Medicare HMO | Admitting: *Deleted

## 2017-07-16 VITALS — BP 144/76 | HR 52 | Temp 98.1°F | Ht 67.0 in | Wt 171.2 lb

## 2017-07-16 DIAGNOSIS — Z1211 Encounter for screening for malignant neoplasm of colon: Secondary | ICD-10-CM | POA: Diagnosis not present

## 2017-07-16 DIAGNOSIS — Z0000 Encounter for general adult medical examination without abnormal findings: Secondary | ICD-10-CM | POA: Diagnosis not present

## 2017-07-16 NOTE — Patient Instructions (Addendum)
[name],  Thank you for taking time to come for yourMedicare Wellness Visit. I appreciate your ongoing commitment to your health goals. Please review the following plan we discussed and let me know if I can assist you in the future.   These are the goals we discussed:  Goals    . Eat 4-5 vegetables a day    . Run another marathon (pt-stated)    . Weight < 167 lb (75.751 kg)        Fall Prevention in the Home Falls can cause injuries. They can happen to people of all ages. There are many things you can do to make your home safe and to help prevent falls. What can I do on the outside of my home?  Regularly fix the edges of walkways and driveways and fix any cracks.  Remove anything that might make you trip as you walk through a door, such as a raised step or threshold.  Trim any bushes or trees on the path to your home.  Use bright outdoor lighting.  Clear any walking paths of anything that might make someone trip, such as rocks or tools.  Regularly check to see if handrails are loose or broken. Make sure that both sides of any steps have handrails.  Any raised decks and porches should have guardrails on the edges.  Have any leaves, snow, or ice cleared regularly.  Use sand or salt on walking paths during winter.  Clean up any spills in your garage right away. This includes oil or grease spills. What can I do in the bathroom?  Use night lights.  Install grab bars by the toilet and in the tub and shower. Do not use towel bars as grab bars.  Use non-skid mats or decals in the tub or shower.  If you need to sit down in the shower, use a plastic, non-slip stool.  Keep the floor dry. Clean up any water that spills on the floor as soon as it happens.  Remove soap buildup in the tub or shower regularly.  Attach bath mats securely with double-sided non-slip rug tape.  Do not have throw rugs and other things on the floor that can make you trip. What can I do in the  bedroom?  Use night lights.  Make sure that you have a light by your bed that is easy to reach.  Do not use any sheets or blankets that are too big for your bed. They should not hang down onto the floor.  Have a firm chair that has side arms. You can use this for support while you get dressed.  Do not have throw rugs and other things on the floor that can make you trip. What can I do in the kitchen?  Clean up any spills right away.  Avoid walking on wet floors.  Keep items that you use a lot in easy-to-reach places.  If you need to reach something above you, use a strong step stool that has a grab bar.  Keep electrical cords out of the way.  Do not use floor polish or wax that makes floors slippery. If you must use wax, use non-skid floor wax.  Do not have throw rugs and other things on the floor that can make you trip. What can I do with my stairs?  Do not leave any items on the stairs.  Make sure that there are handrails on both sides of the stairs and use them. Fix handrails that are broken  or loose. Make sure that handrails are as long as the stairways.  Check any carpeting to make sure that it is firmly attached to the stairs. Fix any carpet that is loose or worn.  Avoid having throw rugs at the top or bottom of the stairs. If you do have throw rugs, attach them to the floor with carpet tape.  Make sure that you have a light switch at the top of the stairs and the bottom of the stairs. If you do not have them, ask someone to add them for you. What else can I do to help prevent falls?  Wear shoes that: ? Do not have high heels. ? Have rubber bottoms. ? Are comfortable and fit you well. ? Are closed at the toe. Do not wear sandals.  If you use a stepladder: ? Make sure that it is fully opened. Do not climb a closed stepladder. ? Make sure that both sides of the stepladder are locked into place. ? Ask someone to hold it for you, if possible.  Clearly mark and make  sure that you can see: ? Any grab bars or handrails. ? First and last steps. ? Where the edge of each step is.  Use tools that help you move around (mobility aids) if they are needed. These include: ? Canes. ? Walkers. ? Scooters. ? Crutches.  Turn on the lights when you go into a dark area. Replace any light bulbs as soon as they burn out.  Set up your furniture so you have a clear path. Avoid moving your furniture around.  If any of your floors are uneven, fix them.  If there are any pets around you, be aware of where they are.  Review your medicines with your doctor. Some medicines can make you feel dizzy. This can increase your chance of falling. Ask your doctor what other things that you can do to help prevent falls. This information is not intended to replace advice given to you by your health care provider. Make sure you discuss any questions you have with your health care provider. Document Released: 10/13/2009 Document Revised: 05/24/2016 Document Reviewed: 01/21/2015 Elsevier Interactive Patient Education  2018 [place] Maintenance, Male A healthy lifestyle and preventive care is important for your health and wellness. Ask your health care provider about what schedule of regular examinations is right for you. What should I know about weight and diet? Eat a Healthy Diet  Eat plenty of vegetables, fruits, whole grains, low-fat dairy products, and lean protein.  Do not eat a lot of foods high in solid fats, added sugars, or salt.  Maintain a Healthy Weight Regular exercise can help you achieve or maintain a healthy weight. You should:  Do at least 150 minutes of exercise each week. The exercise should increase your heart rate and make you sweat (moderate-intensity exercise).  Do strength-training exercises at least twice a week.  Watch Your Levels of Cholesterol and Blood Lipids  Have your blood tested for lipids and cholesterol every 5 years starting  at 75 years of age. If you are at high risk for heart disease, you should start having your blood tested when you are 75 years old. You may need to have your cholesterol levels checked more often if: ? Your lipid or cholesterol levels are high. ? You are older than 75 years of age. ? You are at high risk for heart disease.  What should I know about cancer screening? Many types of  cancers can be detected early and may often be prevented. Lung Cancer  You should be screened every year for lung cancer if: ? You are a current smoker who has smoked for at least 30 years. ? You are a former smoker who has quit within the past 15 years.  Talk to your health care provider about your screening options, when you should start screening, and how often you should be screened.  Colorectal Cancer  Routine colorectal cancer screening usually begins at 75 years of age and should be repeated every 5-10 years until you are 75 years old. You may need to be screened more often if early forms of precancerous polyps or small growths are found. Your health care provider may recommend screening at an earlier age if you have risk factors for colon cancer.  Your health care provider may recommend using home test kits to check for hidden blood in the stool.  A small camera at the end of a tube can be used to examine your colon (sigmoidoscopy or colonoscopy). This checks for the earliest forms of colorectal cancer.  Prostate and Testicular Cancer  Depending on your age and overall health, your health care provider may do certain tests to screen for prostate and testicular cancer.  Talk to your health care provider about any symptoms or concerns you have about testicular or prostate cancer.  Skin Cancer  Check your skin from head to toe regularly.  Tell your health care provider about any new moles or changes in moles, especially if: ? There is a change in a mole's size, shape, or color. ? You have a mole that  is larger than a pencil eraser.  Always use sunscreen. Apply sunscreen liberally and repeat throughout the day.  Protect yourself by wearing long sleeves, pants, a wide-brimmed hat, and sunglasses when outside.  What should I know about heart disease, diabetes, and high blood pressure?  If you are 11-46 years of age, have your blood pressure checked every 3-5 years. If you are 64 years of age or older, have your blood pressure checked every year. You should have your blood pressure measured twice-once when you are at a hospital or clinic, and once when you are not at a hospital or clinic. Record the average of the two measurements. To check your blood pressure when you are not at a hospital or clinic, you can use: ? An automated blood pressure machine at a pharmacy. ? A home blood pressure monitor.  Talk to your health care provider about your target blood pressure.  If you are between 63-59 years old, ask your health care provider if you should take aspirin to prevent heart disease.  Have regular diabetes screenings by checking your fasting blood sugar level. ? If you are at a normal weight and have a low risk for diabetes, have this test once every three years after the age of 25. ? If you are overweight and have a high risk for diabetes, consider being tested at a younger age or more often.  A one-time screening for abdominal aortic aneurysm (AAA) by ultrasound is recommended for men aged 86-75 years who are current or former smokers. What should I know about preventing infection? Hepatitis B If you have a higher risk for hepatitis B, you should be screened for this virus. Talk with your health care provider to find out if you are at risk for hepatitis B infection. Hepatitis C Blood testing is recommended for:  Everyone born from [birth year]  through 1965.  Anyone with known risk factors for hepatitis C.  Sexually Transmitted Diseases (STDs)  You should be screened each year for STDs  including gonorrhea and chlamydia if: ? You are sexually active and are younger than 75 years of age. ? You are older than 75 years of age and your health care provider tells you that you are at risk for this type of infection. ? Your sexual activity has changed since you were last screened and you are at an increased risk for chlamydia or gonorrhea. Ask your health care provider if you are at risk.  Talk with your health care provider about whether you are at high risk of being infected with HIV. Your health care provider may recommend a prescription medicine to help prevent HIV infection.  What else can I do?  Schedule regular health, dental, and eye exams.  Stay current with your vaccines (immunizations).  Do not use any tobacco products, such as cigarettes, chewing tobacco, and e-cigarettes. If you need help quitting, ask your health care provider.  Limit alcohol intake to no more than 2 drinks per day. One drink equals 12 ounces of beer, 5 ounces of wine, or 1 ounces of hard liquor.  Do not use street drugs.  Do not share needles.  Ask your health care provider for help if you need support or information about quitting drugs.  Tell your health care provider if you often feel depressed.  Tell your health care provider if you have ever been abused or do not feel safe at home. This information is not intended to replace advice given to you by your health care provider. Make sure you discuss any questions you have with your health care provider. Document Released: 06/14/2008 Document Revised: 08/15/2016 Document Reviewed: 09/20/2015 Elsevier Interactive Patient Education  [employer].

## 2017-07-16 NOTE — Progress Notes (Signed)
Subjective:   William [name] is a 76 y.o. male who presents for Medicare Annual/Subsequent preventive examination.  Cardiac Risk Factors include: advanced age (>75men, >51 women);dyslipidemia;male gender     Objective:    Vitals: BP (!) 144/76 (BP Location: Right Arm, Patient Position: Sitting, Cuff Size: Normal)   Pulse (!) 52   Temp 98.1 F (36.7 C) (Oral)   Ht 5\' 7"  (1.702 m)   Wt 171 lb 3.2 oz (77.7 kg)   SpO2 96%   BMI 26.81 kg/m   Body mass index is 26.81 kg/m.  BP recheck 136/70 right arm manually with adult cuff  Tobacco History  Smoking Status  . Former Smoker  . Packs/day: 1.50  . Years: 30.00  . Quit date: 04/02/1986  Smokeless Tobacco  . Never Used     Counseling given: Yes Patient is former smoker with no plans to restart   Past Medical History:  Diagnosis Date  . Bleeding gums 11/11/2015  . Dyslipidemia (high LDL; low HDL)   . Epistaxis 02/08/2017  . Ingrown left greater toenail 08/22/2015  . Low back pain 12/27/2014  . Medial epicondylitis of right elbow 12/13/2014  . Numbness and tingling of right leg    Past Surgical History:  Procedure Laterality Date  . bone chip removed from right heel  1998  . FINGER AMPUTATION     injured by table saw, left 4th and 5th fingers  . KNEE ARTHROSCOPY W/ MENISCAL REPAIR  2008   left  . KNEE ARTHROSCOPY W/ MENISCAL REPAIR  11/2011   left  . LACERATION REPAIR  1962   forehead  . LUMBAR DISC SURGERY  09/2007   bone spurs, stenosis, L3-4, 4-5  . POSTERIOR LUMBAR FUSION 4 LEVEL N/A 10/13/2013   Procedure: Lumbar Two-Three, Lumbar Three-Four, Lumbar Four-Five, Lumbar Five-Sacral One posterior lumbar interbody fusion with interbody prosthesis posterior lateral arthrodesis and posterior segmental instrumentation;  Surgeon: [name], MD;  Location: [facility];  Service: Neurosurgery;  Laterality: N/A;  POSTERIOR LUMBAR FUSION 4 LEVEL  . SKIN GRAFT     from right thigh to left forehead  . SMALL INTESTINE  SURGERY  0865   ileitis complications   Family History  Problem Relation Age of Onset  . Stroke Mother   . Heart disease Father   . Stroke Father   . Diabetes Father   . Obesity Brother   . Obesity Brother   . Cancer Maternal Grandfather   . Cancer Maternal Grandmother   . Drug abuse Daughter    History  Sexual Activity  . Sexual activity: Yes  . Partners: Female    Outpatient Encounter Prescriptions as of 07/16/2017  Medication Sig  . albuterol (PROVENTIL HFA;VENTOLIN HFA) 108 (90 Base) MCG/ACT inhaler Inhale 2 puffs into the lungs every 6 (six) hours as needed for wheezing or shortness of breath.  Marland Kitchen aspirin EC 81 MG tablet Take 81 mg by mouth daily.  Marland Kitchen b complex vitamins tablet Take 1 tablet by mouth daily.  Marland Kitchen CALCIUM-MAGNESIUM-VITAMIN D PO Take 1 tablet by mouth.  . docusate sodium (COLACE) 100 MG capsule Take 100 mg by mouth daily.   . fluticasone (FLONASE) 50 MCG/ACT nasal spray Place 2 sprays into both nostrils daily.  . fluticasone furoate-vilanterol (BREO ELLIPTA) 200-25 MCG/INH AEPB Inhale 1 puff into the lungs daily.  . Iron-Vitamins (GERITOL) LIQD Take 5 mLs by mouth.  . Multiple Vitamin (MULTIVITAMIN WITH MINERALS) TABS tablet Take 1 tablet by mouth daily.  . Omega-3  Fatty Acids (FISH OIL CONCENTRATE PO) Take 1 tablet by mouth.  . Potassium 99 MG TABS Take 1 tablet by mouth.  . tiotropium (SPIRIVA) 18 MCG inhalation capsule Place 1 capsule (18 mcg total) into inhaler and inhale daily.  . Turmeric 500 MG CAPS Take 2 tablets by mouth.  . cetirizine (ZYRTEC) 10 MG tablet Take 1 tablet (10 mg total) by mouth daily. (Patient not taking: Reported on 07/16/2017)  . Dextromethorphan-Guaifenesin (MUCINEX DM) 30-600 MG TB12 Take 1 tablet by mouth every 12 (twelve) hours as needed. (Patient not taking: Reported on 09/14/2016)  . gabapentin (NEURONTIN) 300 MG capsule Take 1 capsule (300 mg total) by mouth at bedtime. (Patient not taking: Reported on 07/16/2017)   No  facility-administered encounter medications on file as of 07/16/2017.     Activities of Daily Living In your present state of health, do you have any difficulty performing the following activities: 07/16/2017  Hearing? N  Vision? N  Difficulty concentrating or making decisions? N  Walking or climbing stairs? N  Dressing or bathing? N  Doing errands, shopping? N  Preparing Food and eating ? N  Using the Toilet? N  In the past six months, have you accidently leaked urine? N  Do you have problems with loss of bowel control? N  Managing your Medications? N  Managing your Finances? N  Housekeeping or managing your Housekeeping? N  Some recent data might be hidden   Home Safety:  My home has a working smoke alarm:  Yes X 3           My home throw rugs have been fastened down to the floor or removed:  Non-slip back I have a non-slip surface or non-slip mats in the bathtub and shower:  Yes        All my home's stairs have handrails, including any outdoor stairs  One level home with 1/2 outside step without handrail. Also has full basement with handrails on both sides          My home's floors, stairs and hallways are free from clutter, wires and cords:  Yes     I have animals in my home  Yes: mini poodle and 3 parakeets I wear seatbelts consistently:  Yes   Patient Care Team: [name], MD as PCP - General (Family Medicine) Marica Otter, Roman Forest (Optometry) Latanya Maudlin, MD (Orthopedic Surgery)   Assessment:     Exercise Activities and Dietary recommendations Current Exercise Habits: Home exercise routine, Type of exercise: walking (Cross train walk-run), Time (Minutes): 60, Frequency (Times/Week): 7, Weekly Exercise (Minutes/Week): 420, Intensity: Intense, Exercise limited by: orthopedic condition(s) (Right hip pain)  Goals    . Eat 4-5 vegetables a day    . Run another marathon (pt-stated)    . Weight < 167 lb (75.751 kg)      Fall Risk Fall Risk  07/16/2017 05/30/2017  03/21/2017 09/14/2016 06/28/2016  Falls in the past year? Yes No No No No  Number falls in past yr: 2 or more - - - -  Injury with Fall? Yes - - - -  Risk Factor Category  High Fall Risk - - - -  Risk for fall due to : History of fall(s) - - - -  Risk for fall due to (comments): Running trails - - - -  Follow up Falls evaluation completed;Education provided;Falls prevention discussed - - - -  Patient is avid runner on concrete and running trails with uneven ground. All falls  occurred while running. Falls prevention discussed and literature given. PCP notified of fall risk status.  Depression Screen PHQ 2/9 Scores 07/16/2017 05/30/2017 03/21/2017 02/07/2017  PHQ - 2 Score 0 0 0 0  PHQ- 9 Score - - 0 0    Cognitive Function MMSE - Mini Mental State Exam 03/10/2015 07/28/2012 04/03/2011  Orientation to time 5 5 5   Orientation to Place 5 5 5   Registration 3 3 3   Attention/ Calculation 0 2 5  Recall 1 2 3   Language- name 2 objects 2 2 2   Language- repeat 1 1 1   Language- follow 3 step command 3 3 3   Language- read & follow direction 1 1 1   Write a sentence 1 1 1   Copy design 1 1 1   Total score 23 26 30      TUG Test:  Done in 10 seconds. Falls prevention discussed in detail and literature given.  Cognitive Function: Mini-Cog  Failed with score 2/5    Immunization History  Administered Date(s) Administered  . Influenza Split 10/03/2011, 10/21/2012  . Influenza,inj,Quad PF,36+ Mos 09/25/2013, 09/14/2016  . Influenza-Unspecified 10/03/2014, 10/23/2015  . Pneumococcal Conjugate-13 03/10/2015  . Pneumococcal Polysaccharide-23 04/29/2007  . Td 04/03/2011  . Zoster 04/29/2007   Screening Tests Health Maintenance  Topic Date Due  . COLON CANCER SCREENING ANNUAL FOBT  03/27/2016  . LIPID PANEL  06/28/2017  . INFLUENZA VACCINE  07/31/2017  . TETANUS/TDAP  04/02/2021  . PNA vac Low Risk Adult  Completed  Patient given FIT with instructions from lab staff Discussed Shingrix Discussed  receiving flu vaccine yearly starting 9/1.      Plan:    Patient given FIT with instructions from lab staff  I have personally reviewed and noted the following in the patient's chart:   . Medical and social history . Use of alcohol, tobacco or illicit drugs  . Current medications and supplements . Functional ability and status . Nutritional status . Physical activity . Advanced directives . List of other physicians . Hospitalizations, surgeries, and ER visits in previous 12 months . Vitals . Screenings to include cognitive, depression, and falls . Referrals and appointments  In addition, I have reviewed and discussed with patient certain preventive protocols, quality metrics, and best practice recommendations. A written personalized care plan for preventive services as well as general preventive health recommendations were provided to patient.     Velora Heckler, RN  07/16/2017

## 2017-07-19 ENCOUNTER — Encounter: Payer: Self-pay | Admitting: *Deleted

## 2017-07-19 NOTE — Progress Notes (Signed)
I have reviewed this visit and discussed with Lauren Ducatte, RN, BSN, and agree with her documentation.   Anthonio Mizzell J Kerigan Narvaez, MD  

## 2017-08-23 ENCOUNTER — Encounter: Payer: Self-pay | Admitting: Family Medicine

## 2017-08-23 ENCOUNTER — Ambulatory Visit (INDEPENDENT_AMBULATORY_CARE_PROVIDER_SITE_OTHER): Payer: Medicare HMO | Admitting: Family Medicine

## 2017-08-23 VITALS — BP 110/68 | HR 67 | Temp 97.4°F | Ht 67.0 in | Wt 167.6 lb

## 2017-08-23 DIAGNOSIS — R200 Anesthesia of skin: Secondary | ICD-10-CM | POA: Diagnosis not present

## 2017-08-23 DIAGNOSIS — L570 Actinic keratosis: Secondary | ICD-10-CM

## 2017-08-23 DIAGNOSIS — R51 Headache: Secondary | ICD-10-CM | POA: Diagnosis not present

## 2017-08-23 DIAGNOSIS — R519 Headache, unspecified: Secondary | ICD-10-CM

## 2017-08-23 DIAGNOSIS — R253 Fasciculation: Secondary | ICD-10-CM | POA: Diagnosis not present

## 2017-08-23 DIAGNOSIS — Z1211 Encounter for screening for malignant neoplasm of colon: Secondary | ICD-10-CM | POA: Diagnosis not present

## 2017-08-23 DIAGNOSIS — R251 Tremor, unspecified: Secondary | ICD-10-CM

## 2017-08-23 DIAGNOSIS — J449 Chronic obstructive pulmonary disease, unspecified: Secondary | ICD-10-CM

## 2017-08-23 DIAGNOSIS — Z23 Encounter for immunization: Secondary | ICD-10-CM

## 2017-08-23 LAB — POCT SEDIMENTATION RATE: POCT SED RATE: 5 mm/h (ref 0–22)

## 2017-08-23 MED ORDER — FLUTICASONE FUROATE-VILANTEROL 200-25 MCG/INH IN AEPB: 1.0000 | INHALATION_SPRAY | Freq: Every day | RESPIRATORY_TRACT | 2 refills | Status: DC

## 2017-08-23 MED ORDER — ALBUTEROL SULFATE HFA 108 (90 BASE) MCG/ACT IN AERS: 2.0000 | INHALATION_SPRAY | Freq: Four times a day (QID) | RESPIRATORY_TRACT | 2 refills | Status: DC | PRN

## 2017-08-23 MED ORDER — GABAPENTIN 300 MG PO CAPS: 600.0000 mg | ORAL_CAPSULE | Freq: Every day | ORAL | 1 refills | Status: DC

## 2017-08-23 NOTE — Patient Instructions (Addendum)
It was great to see you again today!  I will send in refills on your inhalers  Schedule in dermatology procedures clinic  Checking blood test today to make sure the vessels in your temples are ok  Increase gabapentin to 600mg  at night  For the tremor & muscle spasms in calves I am going to send you to neurology  See me in 3 months  Be well, Dr. Ardelia Mems

## 2017-08-23 NOTE — Addendum Note (Signed)
Addended by: [place] Pink on: 08/23/2017 12:08 PM   Modules accepted: Orders

## 2017-08-23 NOTE — Progress Notes (Addendum)
Date of Visit: 08/23/2017   HPI:  Patient presents for follow up & to meet new PCP. Recently assigned to me after prior PCP Dr. Ree Kida left our practice.  Asthma-COPD Overlap syndrome: Patient states that his breathing has been much better since being started on Breo and Spiriva inhalers. These have allowed him to increase his activity level and he is able to exercise heavily. He continues to use as much as Ventolin 4 times a day to prevent wheezing during his exercise regimen. However, he does not need this medication when not exercising. He does endorse feeling light-headed when he uses the Breo and Spiriva inhaler at the same time. However, he has separated the time he takes these medications and no longer experiences this lightheadedness   Painful Skin on Temples: Patient report that he has had this problem before and previous doctors have related it to sun exposure. He now tries to avoid the sun by running early in the morning or after 6 PM. He endorses some pain and redness the skin overlying bilateral temples. He has not noticed any recent changes in the appearance of his skin in this area but does notice it gets red with sun exposure. He denies changes in vision and or headaches and seems to endorse the temporal pain being superficial on the skin more so than deep.   Right Leg numbness and pain: Patient states that he was started on Gabapentin 359m, which he takes before bed due to leg pain, which primarily occurred at night. The Gabapentin provides him with relief from this pain and allows him to sleep comfortably for 6 hours. However, it tends to wear off after this time period, at which point it the feeling of leg tightness and pain will disturb the patient's sleep. Symptoms do not persist during the day.   Tremor in right hand and right sided calf fasciculation: The patient states that in the last couple of months he will occasionally have a tremor in his right hand when he is trying to  write. This will affect the quality of his handwriting and alter other activities when it occurs. In addition the patient states that he has had a spasm in the of his right calf that occurs daily, but does not last for an entire day. During the exam the patient's calves began to fasciculate. This was not associated with other spasm or movement of the leg.   ROS: per HPI  PHYSICAL EXAM: BP 110/68   Pulse 67   Temp (!) 97.4 F (36.3 C) (Oral)   Ht _0  (1.702 m)   Wt 167 lb 9.6 oz (76 kg)   SpO2 95%   BMI 26.25 kg/m  Gen: No acute distress, fit and pleasant  HEENT: [place], normocephalic Heart: RRR, nMRG, nl S1, S2  Lungs: CAB, no wheezing in bilateral lungs  Neuro: grossly nonfocal, speech normal Ext: No cyanosis or clubbing, no peripheral edema, bilateral calves with muscle fasciculations medially Skin: Bilateral temples slightly red with no obvious crusting or raised lesions, but some rough dry skin, skin on rest of face is free of discoloration or lesions  Except large hypopigmented scar to left eyebrow  ASSESSMENT/PLAN:  Health maintenance:  - patient turned in FIT stool cards today - flu shot given today   Asthma-COPD overlap syndrome ([place] Patient endorses improvements of symptoms with start of Breo and Spiriva inhalers. He still uses Albuterol up to 4 times while exercising, but does not require this medication at rest or with  ADLs. He is satisfied with the management of this condition and the level of activity he is able to engage in. -continue Breo and Spiriva inhalers daily- OK to take one in morning and one later in the day if this manages side effects  -Albuterol PRN    Right leg numbness Since starting Gabapentin 312m, the patient has notice improvement in his symptoms of nighttime pain and squeezing sensations in the right leg. However, he states that the medicine will wear off in 6 hours and that he is aware of these symtpms in the morning after the medications  wear off.  -increase Gabapentin to 6067mand monitor effect on symptoms   AK (actinic keratosis) Patient complains of pain in the skin of bilateral temples associated with increased time in the sun and redness of the affected area. He denies changes in vision or the sense that the pain is located deeper in the temple. The superficial location of the pain makes a skin condition secondary to sun exposure more likely than Giant Cell Arteritis. Mild roughness of skin in this area, consistent with possible prior treated AKs -get ESR to rule out Giant Cell Arteritis (low suspicion) -[facility] for reassessment and possible cryotherapy  -continue to avoid excessive sun exposure   Tremor of right hand The patient endorses a tremor in his right hand, which happens sporadically, has only appeared in the last several months and is worse with activities such as witting. The etiology of this tremor is currently unclear. However, the differential includes benign causes such as an essential tremor and more concerning neurologic etiologies. When combined with the new onset of calf fasciculations, these symptoms warrant more complete neurologic evaluation. -referral to neurology   FOLLOW UP: Follow up in 3 months with me Referring to neurology Schedule in derm procedures clinic  Patient seen along with MSLakemoreI personally evaluated this patient along with the student, and verified all aspects of the history, physical exam, and medical decision making as documented by the student. I agree with the student's documentation and have made all necessary edits.  BrBeaverMcArdelia MemsMDMemphis

## 2017-08-23 NOTE — Assessment & Plan Note (Addendum)
Patient complains of pain in the skin of bilateral temples associated with increased time in the sun and redness of the affected area. He denies changes in vision or the sense that the pain is located deeper in the temple. The superficial location of the pain makes a skin condition secondary to sun exposure more likely than Giant Cell Arteritis. Mild roughness of skin in this area, consistent with possible prior treated AKs -get ESR to rule out Giant Cell Arteritis (low suspicion) -schedule appointment here at [facility] in dermatology procedures clinic for reassessment and possible cryotherapy  -continue to avoid excessive sun exposure

## 2017-08-23 NOTE — Assessment & Plan Note (Signed)
Patient endorses improvements of symptoms with start of Breo and Spiriva inhalers. He still uses Albuterol up to 4 times while exercising, but does not require this medication at rest or with ADLs. He is satisfied with the management of this condition and the level of activity he is able to engage in. -continue Breo and Spiriva inhalers daily- OK to take one in morning and one later in the day if this manages side effects  -Albuterol PRN

## 2017-08-23 NOTE — Assessment & Plan Note (Signed)
Since starting Gabapentin 300mg , the patient has notice improvement in his symptoms of nighttime pain and squeezing sensations in the right leg. However, he states that the medicine will wear off in 6 hours and that he is aware of these symtpms in the morning after the medications wear off.  -increase Gabapentin to 600mg  and monitor effect on symptoms

## 2017-08-23 NOTE — Assessment & Plan Note (Addendum)
The patient endorses a tremor in his right hand, which happens sporadically, has only appeared in the last several months and is worse with activities such as witting. The etiology of this tremor is currently unclear. However, the differential includes benign causes such as an essential tremor and more concerning neurologic etiologies. When combined with the new onset of calf fasciculations, these symptoms warrant more complete neurologic evaluation. -referral to neurology

## 2017-08-25 ENCOUNTER — Encounter: Payer: Self-pay | Admitting: Family Medicine

## 2017-08-26 ENCOUNTER — Encounter: Payer: Self-pay | Admitting: Neurology

## 2017-08-27 ENCOUNTER — Encounter: Payer: Self-pay | Admitting: Family Medicine

## 2017-08-27 DIAGNOSIS — H5213 Myopia, bilateral: Secondary | ICD-10-CM | POA: Diagnosis not present

## 2017-08-27 LAB — FECAL OCCULT BLOOD, IMMUNOCHEMICAL: Fecal Occult Bld: NEGATIVE

## 2017-09-18 ENCOUNTER — Ambulatory Visit (INDEPENDENT_AMBULATORY_CARE_PROVIDER_SITE_OTHER): Payer: Medicare HMO | Admitting: Family Medicine

## 2017-09-18 DIAGNOSIS — I781 Nevus, non-neoplastic: Secondary | ICD-10-CM | POA: Insufficient documentation

## 2017-09-18 NOTE — Patient Instructions (Addendum)
It was a pleasure meeting you today.   Today we discussed your skin lesions. We believe your skin lesions to be spider angioma, which is a dilation of the blood vessels causing a "spider like" appearance to the skin. This is a benign condition and treatment is for cosmetic reasons. You can have laser treatment done but we do not do this in the clinic and you would need to be referred.   Please avoid triggers to reduce symptoms, for example excessive exercise.   Avoiding alcohol, caffeine, and tobacco can also help.  If the coconut oil is helping, you may continue to use this as needed for any burning.   Please follow up with your PCP and consider following up for liver function tests in the future if they are concerned, or sooner if symptoms persist or worsen.  Our clinic's number is [phone]. Please call with questions or concerns.   Thank you,  Caroline More, DO

## 2017-09-18 NOTE — Assessment & Plan Note (Addendum)
Likely spider angioma given spidery appearance of blood vessels bilaterally in temporal regions bilaterally and worsening with excessive exercise.  -recommended symptomatic management.  -patient stated coconut lotion improved burning, recommended continuing lotion as needed -recommended decreasing caffeine intake and avoidance of alcohol and tobacco use (patient denies current alcohol or tobacco use) -can consider laser treatment in future for cosmetic reasons -can consider further investigation for liver disease with PCP

## 2017-09-18 NOTE — Progress Notes (Signed)
   Subjective:    Patient ID: William [name], male    DOB: 04-Feb-1941, 76 y.o.   MRN: [id number]   CC: Skin reassessment & possible cryotherapy   HPI:  Skin assessment Patient has presented for skin re-assessment for areas around temples bilaterally. Patient was seen by Dr. Ardelia Mems on 08/23/2017 and was recommended for referral to dermatology clinic for assessment and possible cryotherapy. Has been having cryotherapy to face with Dr. Ardelia Mems and [facility] for history of Actinic Keratosis due to excessive sun exposure in youth from running outside. Patient still runs but now runs earlier in the day so it is not as sunny out or during the evening once sun has set. Uses sunscreen and hats occasional for sun protection.   Patient notes skin in temples is red and can become more red with increased running. Patient notes burning, and sometimes has more pain than other times. No drainage or bleeding noted.   Has been using coconut lotion which relieves burning.    No hx of skin cancer or liver disease.   Father has hx of similar skin reaction in temples. Father was a Psychologist, sport and exercise and worked in Psychologist, forensic.No family hx of skin cancer. MGM had cancer at age 66 y.o. but patient unaware as to what kind of cancer.    Seen by Dr. Ardelia Mems on 08/23/2017.   Smoking status reviewed. Patient quit smoking over 30 years ago.   Review of Systems All negative other than noted in HPI  Objective:  BP 120/64   Pulse 60   Temp 97.6 F (36.4 C) (Oral)   Ht 5\' 7"  (1.702 m)   Wt 168 lb (76.2 kg)   SpO2 99%   BMI 26.31 kg/m  Vitals and nursing note reviewed  General: well nourished, in no acute distress HEENT: normocephalic Neuro: alert and oriented, no focal deficits Skin: warm and dry, rough in temporal regions bilaterally. Spidery appearance to blood vessels bilaterally. Slight area of hypopigmentation 5 mm x 4 mm on left temporal area.           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  -recommended symptomatic management.  -patient stated coconut lotion improved burning, recommended continuing lotion as needed -recommended decreasing caffeine intake and avoidance of alcohol and tobacco use (patient denies current alcohol or tobacco use) -can consider laser treatment in future for cosmetic reasons -can consider further investigation for liver disease with PCP   Return if symptoms worsen or fail to improve.  Caroline More, DO, PGY-1

## 2017-11-15 ENCOUNTER — Ambulatory Visit: Payer: Medicare HMO | Admitting: Family Medicine

## 2017-12-02 ENCOUNTER — Ambulatory Visit (INDEPENDENT_AMBULATORY_CARE_PROVIDER_SITE_OTHER): Payer: Medicare HMO | Admitting: Neurology

## 2017-12-02 ENCOUNTER — Encounter: Payer: Self-pay | Admitting: Neurology

## 2017-12-02 ENCOUNTER — Other Ambulatory Visit (INDEPENDENT_AMBULATORY_CARE_PROVIDER_SITE_OTHER): Payer: Medicare HMO

## 2017-12-02 VITALS — BP 124/74 | HR 70 | Ht 67.0 in | Wt 163.1 lb

## 2017-12-02 DIAGNOSIS — M48061 Spinal stenosis, lumbar region without neurogenic claudication: Secondary | ICD-10-CM

## 2017-12-02 DIAGNOSIS — R253 Fasciculation: Secondary | ICD-10-CM

## 2017-12-02 DIAGNOSIS — G629 Polyneuropathy, unspecified: Secondary | ICD-10-CM

## 2017-12-02 DIAGNOSIS — G2581 Restless legs syndrome: Secondary | ICD-10-CM

## 2017-12-02 LAB — FERRITIN: FERRITIN: 39.2 ng/mL (ref 22.0–322.0)

## 2017-12-02 LAB — VITAMIN B12: VITAMIN B 12: 480 pg/mL (ref 211–911)

## 2017-12-02 LAB — FOLATE

## 2017-12-02 LAB — TSH: TSH: 0.66 u[IU]/mL (ref 0.35–4.50)

## 2017-12-02 MED ORDER — GABAPENTIN 300 MG PO CAPS: 900.0000 mg | ORAL_CAPSULE | Freq: Every day | ORAL | 3 refills | Status: DC

## 2017-12-02 MED ORDER — TIZANIDINE HCL 2 MG PO TABS: 2.0000 mg | ORAL_TABLET | Freq: Every evening | ORAL | 3 refills | Status: DC | PRN

## 2017-12-02 NOTE — Patient Instructions (Addendum)
1.  Check labs 2.  NCS/EMG of the legs  3.  Stat tizanidine 2mg  at bedtime  4.  Continue gabapentin 900mg  at bedtime  Return to clinic 4 months

## 2017-12-02 NOTE — Progress Notes (Signed)
William [name] - Initial Visit   Date: 12/02/17  William [name] MRN: [id number] DOB: [birth year]   Dear Dr. Ardelia Mems:  Thank you for your kind referral of William [name] for consultation of right leg numbness and muscle twitching. Although his history is well known to you, please allow [place] to reiterate it for the purpose of our medical record. The patient was accompanied to the clinic by self.    History of Present Illness: William [name] is a 76 y.o. male with  history of lumbar spinal stenosis s/p decompression and laminectomy and fusion at L2-S1 presenting for evaluation of right leg numbness and muscle twitching.    Starting around early 2007, he began having tingling and vibration sensation over the lower legs and numbness of the feet.  He complains of tightness and numbness of the feet.  He was started on gabapentin 600mg  at bedtime and compression socks which helps.  Symptoms are worse at nighttime and often wakes up having to walking around.  He denies any back pain. He has history of lumbar spinal stenosis s/p decompression and laminectomy and fusion at L2-S1 in 2014.  He also complains of cramps in the calves and muscle twitches. This has been present for many years.  He denies any weakness. He is very active and runs about 20-40 miles per week and a combination of 70-100 miles/week with running, stairstep, and biking.  He has noticed that previously he could start running immediately, but now has to start walking and transition into a job before he can run.  Out-side paper records, electronic medical record, and images have been reviewed where available and summarized as:  MRI lumbar spine wo contrast 12/02/2013: 1.  Interval surgery at L3-L4 and L4-L5.  Improved thecal sac patency at the former, but at the latter there is multifactorial recurrent severe spinal stenosis, in part related to a posteriorly situated synovial cyst. 2.  Moderate to severe  bilateral L4 foraminal stenosis also appears mildly progressed and might reflect interval increased facet hypertrophy. 3.  Other lumbar levels are stable.  Past Medical History:  Diagnosis Date  . Bleeding gums 11/11/2015  . Dyslipidemia (high LDL; low HDL)   . Epistaxis 02/08/2017  . Ingrown left greater toenail 08/22/2015  . Low back pain 12/27/2014  . Medial epicondylitis of right elbow 12/13/2014  . Numbness and tingling of right leg     Past Surgical History:  Procedure Laterality Date  . bone chip removed from right heel  1998  . FINGER AMPUTATION     injured by table saw, left 4th and 5th fingers  . KNEE ARTHROSCOPY W/ MENISCAL REPAIR  2008   left  . KNEE ARTHROSCOPY W/ MENISCAL REPAIR  11/2011   left  . LACERATION REPAIR  1962   forehead  . LUMBAR DISC SURGERY  09/2007   bone spurs, stenosis, L3-4, 4-5  . POSTERIOR LUMBAR FUSION 4 LEVEL N/A 10/13/2013   Procedure: Lumbar Two-Three, Lumbar Three-Four, Lumbar Four-Five, Lumbar Five-Sacral One posterior lumbar interbody fusion with interbody prosthesis posterior lateral arthrodesis and posterior segmental instrumentation;  Surgeon: [name], MD;  Location: [facility];  Service: Neurosurgery;  Laterality: N/A;  POSTERIOR LUMBAR FUSION 4 LEVEL  . SKIN GRAFT     from right thigh to left forehead  . SMALL INTESTINE SURGERY  3818   ileitis complications     Medications:  Outpatient Encounter Medications as of 12/02/2017  Medication Sig  . albuterol (PROVENTIL  HFA;VENTOLIN HFA) 108 (90 Base) MCG/ACT inhaler Inhale 2 puffs into the lungs every 6 (six) hours as needed for wheezing or shortness of breath.  Marland Kitchen aspirin EC 81 MG tablet Take 81 mg by mouth daily.  Marland Kitchen b complex vitamins tablet Take 1 tablet by mouth daily.  Marland Kitchen CALCIUM-MAGNESIUM-VITAMIN D PO Take 1 tablet by mouth.  . cetirizine (ZYRTEC) 10 MG tablet Take 1 tablet (10 mg total) by mouth daily.  Marland Kitchen docusate sodium (COLACE) 100 MG capsule Take 100 mg by mouth daily.     . fluticasone (FLONASE) 50 MCG/ACT nasal spray Place 2 sprays into both nostrils daily.  . fluticasone furoate-vilanterol (BREO ELLIPTA) 200-25 MCG/INH AEPB Inhale 1 puff into the lungs daily.  Marland Kitchen gabapentin (NEURONTIN) 300 MG capsule Take 3 capsules (900 mg total) by mouth at bedtime.  . Iron-Vitamins (GERITOL) LIQD Take 5 mLs by mouth.  . Multiple Vitamin (MULTIVITAMIN WITH MINERALS) TABS tablet Take 1 tablet by mouth daily.  . Omega-3 Fatty Acids (FISH OIL CONCENTRATE PO) Take 1 tablet by mouth.  . Potassium 99 MG TABS Take 1 tablet by mouth.  . tiotropium (SPIRIVA) 18 MCG inhalation capsule Place 1 capsule (18 mcg total) into inhaler and inhale daily.  . Turmeric 500 MG CAPS Take 2 tablets by mouth.  . [DISCONTINUED] gabapentin (NEURONTIN) 300 MG capsule Take 2 capsules (600 mg total) by mouth at bedtime.  Marland Kitchen tiZANidine (ZANAFLEX) 2 MG tablet Take 1 tablet (2 mg total) by mouth at bedtime as needed (muscle cramps and tightness).  . [DISCONTINUED] Dextromethorphan-Guaifenesin (MUCINEX DM) 30-600 MG TB12 Take 1 tablet by mouth every 12 (twelve) hours as needed. (Patient not taking: Reported on 09/14/2016)   No facility-administered encounter medications on file as of 12/02/2017.      Allergies:  Allergies  Allergen Reactions  . Hydrocodone Other (See Comments)    Causes constipation even with stool softner    Family History: Family History  Problem Relation Age of Onset  . Stroke Mother   . Heart disease Father   . Stroke Father   . Diabetes Father   . Obesity Brother   . Obesity Brother   . Cancer Maternal Grandfather   . Cancer Maternal Grandmother   . Drug abuse Daughter     Social History: Social History   Tobacco Use  . Smoking status: Former Smoker    Packs/day: 1.50    Years: 30.00    Pack years: 45.00    Last attempt to quit: 04/02/1986    Years since quitting: 31.6  . Smokeless tobacco: Never Used  Substance Use Topics  . Alcohol use: Yes    Alcohol/week: 0.0  oz    Comment: 6 beers per month  . Drug use: No   Social History   Social History Narrative   Health Care POA:    Emergency Contact: wife, Greysin Medlen, [phone]   End of Life Plan: Pt does not have, not interested at this time   Who lives with you: Lives with 3rd wife and two sons   Daughter by first marriage who lives in [place], 2 grandsons   Any pets: dog Location manager) and 3 parakeets   Diet: Patient has a varied diet and has recently tried increasing f/v.    Exercise: Patient goings to [facility] 4-5 days a week, bike, strength training   Seatbelts: Patient reports wearing seatbelt occasionally   Sun Exposure/Protection: Patient does not wear daily sun protection.   Hobbies: Running, exercising, watching TV  Education: some college.                Review of Systems:  CONSTITUTIONAL: No fevers, chills, night sweats, or weight loss.   EYES: No visual changes or eye pain ENT: No hearing changes.  No history of nose bleeds.   RESPIRATORY: No cough, wheezing and shortness of breath.   CARDIOVASCULAR: Negative for chest pain, and palpitations.   GI: Negative for abdominal discomfort, blood in stools or black stools.  No recent change in bowel habits.   GU:  No history of incontinence.   MUSCLOSKELETAL: No history of joint pain or swelling.  No myalgias.   SKIN: Negative for lesions, rash, and itching.   HEMATOLOGY/ONCOLOGY: Negative for prolonged bleeding, bruising easily, and swollen nodes.  No history of cancer.   ENDOCRINE: Negative for cold or heat intolerance, polydipsia or goiter.   PSYCH:  No depression or anxiety symptoms.   NEURO: As Above.   Vital Signs:  BP 124/74   Pulse 70   Ht 5\' 7"  (1.702 m)   Wt 163 lb 2 oz (74 kg)   SpO2 97%   BMI 25.55 kg/m  Pain Scale: 0 on a scale of 0-10   General Medical Exam:   General:  Well appearing, comfortable.   Eyes/ENT: see cranial nerve examination.   Neck: No masses appreciated.  Full range of motion without  tenderness.  No carotid bruits. Respiratory:  Clear to auscultation, good air entry bilaterally.   Cardiac:  Regular rate and rhythm, no murmur.   Extremities:  No deformities, edema, or skin discoloration.  Skin:  No rashes or lesions.  Neurological Exam: MENTAL STATUS including orientation to time, place, person, recent and remote memory, attention span and concentration, language, and fund of knowledge is normal.  Speech is not dysarthric.  CRANIAL NERVES: II:  No visual field defects.  Unremarkable fundi.   III-IV-VI: Pupils equal round and reactive to light.  Normal conjugate, extra-ocular eye movements in all directions of gaze.  No nystagmus.  No ptosis.   V:  Normal facial sensation.  Jaw jerk is absent.   VII:  Normal facial symmetry and movements.  No pathologic facial reflexes.  VIII:  Normal hearing and vestibular function.   IX-X:  Normal palatal movement.   XI:  Normal shoulder shrug and head rotation.   XII:  Normal tongue strength and range of motion, no deviation or fasciculation.  MOTOR:  Mild R gastroc loss of muscle bulk as compared to left.  There are fasciculations seen bilateral gastrocnemius muscles.   No pronator drift.  Tone is normal.    Right Upper Extremity:    Left Upper Extremity:    Deltoid  5/5   Deltoid  5/5   Biceps  5/5   Biceps  5/5   Triceps  5/5   Triceps  5/5   Wrist extensors  5/5   Wrist extensors  5/5   Wrist flexors  5/5   Wrist flexors  5/5   Finger extensors  5/5   Finger extensors  5/5   Finger flexors  5/5   Finger flexors  5/5   Dorsal interossei  5/5   Dorsal interossei  5/5   Abductor pollicis  5/5   Abductor pollicis  5/5   Tone (Ashworth scale)  0  Tone (Ashworth scale)  0   Right Lower Extremity:    Left Lower Extremity:    Hip flexors  5/5   Hip flexors  5/5  Hip extensors  5/5   Hip extensors  5/5   Knee flexors  5/5   Knee flexors  5/5   Knee extensors  5/5   Knee extensors  5/5   Dorsiflexors  5/5   Dorsiflexors  5/5     Plantarflexors  5/5   Plantarflexors  5/5   Toe extensors  5/5   Toe extensors  5/5   Toe flexors  5/5   Toe flexors  5/5   Tone (Ashworth scale)  0+  Tone (Ashworth scale)  0+   MSRs:  Right                                                                 Left brachioradialis 2+  brachioradialis 2+  biceps 2+  biceps 2+  triceps 2+  triceps 2+  patellar 1+  patellar 1+  ankle jerk 0  ankle jerk 0  Hoffman no  Hoffman no  plantar response down  plantar response up   SENSORY:  Temperature and pin prick reduced distal to mid-calf bilaterally, vibration is absent distal to ankles bilaterally.  There is sway with Rhomberg testing.  COORDINATION/GAIT: Normal finger-to- nose-finger.  Intact rapid alternating movements bilaterally.  Able to rise from a chair without using arms.  Gait narrow based and stable. Tandem and stressed gait intact.    IMPRESSION: 1.  Neuropathy affecting the feet, likely idiopathic 2.  Fasciculations in the legs are due to lumbosacral radiculopathy.  He has history of lumbar spinal stenosis s/p decompression and laminectomy and fusion at L2-S1 which most likely explains his extensor response on the left and increased tone 3.  Restless leg syndrome  PLAN/RECOMMENDATIONS:  1.  Check ferritin, vitamin B12, folate, TSH, copper, SPEP with IFE 2.  NCS/EMG of the legs 3.  Stat tizanidine 2mg  at bedtime.  Encouraged to stretch regularly before and following exercise 4.  Continue gabapentin 900mg  at bedtime  5.  Consider MRI lumbar spine going forward  Return to clinic in 4 months.  The duration of this appointment visit was 60 minutes of face-to-face time with the patient.  Greater than 50% of this time was spent in counseling, explanation of diagnosis, planning of further management, and coordination of care.   Thank you for allowing me to participate in patient's care.  If I can answer any additional questions, I would be pleased to do so.     Sincerely,    [name]. Posey Pronto, DO

## 2017-12-05 LAB — PROTEIN ELECTROPHORESIS, SERUM
ALPHA 2: 0.8 g/dL (ref 0.5–0.9)
Albumin ELP: 3.9 g/dL (ref 3.8–4.8)
Alpha 1: 0.2 g/dL (ref 0.2–0.3)
BETA GLOBULIN: 0.4 g/dL (ref 0.4–0.6)
Beta 2: 0.3 g/dL (ref 0.2–0.5)
GAMMA GLOBULIN: 0.8 g/dL (ref 0.8–1.7)
Total Protein: 6.4 g/dL (ref 6.1–8.1)

## 2017-12-05 LAB — IMMUNOFIXATION ELECTROPHORESIS
IGG (IMMUNOGLOBIN G), SERUM: 918 mg/dL (ref 694–1618)
IgM, Serum: 39 mg/dL — ABNORMAL LOW (ref 48–271)
Immunofix Electr Int: NOT DETECTED
Immunoglobulin A: 216 mg/dL (ref 81–463)

## 2017-12-05 LAB — COPPER, SERUM: Copper: 99 ug/dL (ref 70–175)

## 2017-12-12 ENCOUNTER — Other Ambulatory Visit: Payer: Self-pay | Admitting: *Deleted

## 2017-12-12 ENCOUNTER — Other Ambulatory Visit: Payer: Self-pay | Admitting: Family Medicine

## 2017-12-12 ENCOUNTER — Ambulatory Visit (INDEPENDENT_AMBULATORY_CARE_PROVIDER_SITE_OTHER): Payer: Medicare HMO | Admitting: Neurology

## 2017-12-12 DIAGNOSIS — R200 Anesthesia of skin: Secondary | ICD-10-CM | POA: Diagnosis not present

## 2017-12-12 DIAGNOSIS — M5417 Radiculopathy, lumbosacral region: Secondary | ICD-10-CM

## 2017-12-12 DIAGNOSIS — G629 Polyneuropathy, unspecified: Secondary | ICD-10-CM

## 2017-12-12 NOTE — Procedures (Signed)
[facility]  [place], [place]  [place], [place] [address] Tel: [phone] Fax:  [phone] Test Date:  12/12/2017  Patient: William [name] DOB: 24-Jun-1941 Physician: Narda Amber, DO  Sex: Male Height: 5\' 7"  Ref Phys: Narda Amber, DO  ID#: [id number] Temp: 32.6C Technician:    Patient Complaints: This is a 76 year old man referred for evaluation of bilateral feet numbness, cramps, and fasciculations.  NCV & EMG Findings: Extensive electrodiagnostic testing of the right lower extremity and additional studies of the left shows:  1. Bilateral sural sensory responses are within normal limits. Bilateral superficial peroneal sensory responses are absent. 2. Bilateral peroneal and tibial motor responses are within normal limits. 3. Bilateral tibial H reflex studies are within normal limits. 4. Chronic motor axon loss changes are seen affecting the rectus femoris and tibialis anterior muscles bilaterally, without accompanied active denervation. Fasciculation potentials are seen in 4 of the 12 tested muscles.  Impression: 1. The electrophysiologic findings are most consistent with a sensorimotor polyneuropathy, axon loss in type, affecting the lower extremity. Overall, these findings are mild-to-moderate in degree electrically. 2. There is a superimposed chronic L4 radiculopathy affecting bilateral lower extremities. 3. Specifically, there is no evidence of a widespread disorder of anterior horn cells.    ___________________________ Narda Amber, DO    Nerve Conduction Studies Anti Sensory Summary Table   Site NR Peak (ms) Norm Peak (ms) P-T Amp (V) Norm P-T Amp  Left Sup Peroneal Anti Sensory (Ant Lat Mall)  12 cm NR  <4.6  >3  Right Sup Peroneal Anti Sensory (Ant Lat Mall)  12 cm NR  <4.6  >3  Left Sural Anti Sensory (Lat Mall)  Calf    2.2 <4.6 4.6 >3  Right Sural Anti Sensory (Lat Mall)  Calf    2.8 <4.6 3.4 >3   Motor Summary Table   Site NR Onset  (ms) Norm Onset (ms) O-P Amp (mV) Norm O-P Amp Site1 Site2 Delta-0 (ms) Dist (cm) Vel (m/s) Norm Vel (m/s)  Left Peroneal Motor (Ext Dig Brev)  Ankle    4.1 <6.0 3.5 >2.5 B Fib Ankle 7.2 35.0 49 >40  B Fib    11.3  2.7  Poplt B Fib 1.7 10.0 59 >40  Poplt    13.0  2.7         Right Peroneal Motor (Ext Dig Brev)  Ankle    3.3 <6.0 4.2 >2.5 B Fib Ankle 8.3 34.0 41 >40  B Fib    11.6  3.8  Poplt B Fib 0.9 8.0 89 >40  Poplt    12.5  3.6         Left Tibial Motor (Abd Hall Brev)  Ankle    3.2 <6.0 4.2 >4 Knee Ankle 9.8 39.0 40 >40  Knee    13.0  2.6         Right Tibial Motor (Abd Hall Brev)  Ankle    3.7 <6.0 5.1 >4 Knee Ankle 8.8 42.0 48 >40  Knee    12.5  3.5          H Reflex Studies   NR H-Lat (ms) Lat Norm (ms) L-R H-Lat (ms)  Left Tibial (Gastroc)     34.69 <35 0.14  Right Tibial (Gastroc)     34.83 <35 0.14   EMG   Side Muscle Ins Act Fibs Psw Fasc Number Recrt Dur Dur. Amp Amp. Poly Poly. Comment  Left RectFemoris Nml Nml Nml Nml 1- Rapid Some 1+  Some 1+ Nml Nml N/A  Left AntTibialis Nml Nml Nml 1+ 1- Rapid Some 1+ Some 1+ Nml Nml N/A  Left Gastroc Nml Nml Nml 1+ Nml Nml Nml Nml Nml Nml Nml Nml N/A  Left GluteusMed Nml Nml Nml Nml Nml Nml Nml Nml Nml Nml Nml Nml N/A  Left BicepsFemS Nml Nml Nml Nml Nml Nml Nml Nml Nml Nml Nml Nml N/A  Left Flex Dig Long Nml Nml Nml Nml Nml Nml Nml Nml Nml Nml Nml Nml N/A  Right AntTibialis Nml Nml Nml 1+ 1- Rapid Some 1+ Some 1+ Nml Nml N/A  Right RectFemoris Nml Nml Nml Nml 1- Rapid Some 1+ Some 1+ Nml Nml N/A  Right Flex Dig Long Nml Nml Nml Nml Nml Nml Nml Nml Nml Nml Nml Nml N/A  Right Gastroc Nml Nml Nml 1+ Nml Nml Nml Nml Nml Nml Nml Nml N/A  Right GluteusMed Nml Nml Nml Nml Nml Nml Nml Nml Nml Nml Nml Nml N/A  Right BicepsFemS Nml Nml Nml Nml Nml Nml Nml Nml Nml Nml Nml Nml N/A      Waveforms:

## 2017-12-12 NOTE — Telephone Encounter (Signed)
Patient came to office stated pharmacy still waiting on response from pcp to refill Spiriva Inhaler & the [facility] . Patient needs this as soon as possible totally out. Any questions [phone]. Pharmacy [facility] on [address]

## 2017-12-13 ENCOUNTER — Telehealth: Payer: Self-pay | Admitting: *Deleted

## 2017-12-13 ENCOUNTER — Other Ambulatory Visit: Payer: Self-pay | Admitting: *Deleted

## 2017-12-13 DIAGNOSIS — M5417 Radiculopathy, lumbosacral region: Secondary | ICD-10-CM

## 2017-12-13 DIAGNOSIS — R253 Fasciculation: Secondary | ICD-10-CM

## 2017-12-13 DIAGNOSIS — R200 Anesthesia of skin: Secondary | ICD-10-CM

## 2017-12-13 DIAGNOSIS — G629 Polyneuropathy, unspecified: Secondary | ICD-10-CM

## 2017-12-13 DIAGNOSIS — G2581 Restless legs syndrome: Secondary | ICD-10-CM

## 2017-12-13 DIAGNOSIS — M48061 Spinal stenosis, lumbar region without neurogenic claudication: Secondary | ICD-10-CM

## 2017-12-13 MED ORDER — TIOTROPIUM BROMIDE MONOHYDRATE 18 MCG IN CAPS: 18.0000 ug | ORAL_CAPSULE | Freq: Every day | RESPIRATORY_TRACT | 5 refills | Status: DC

## 2017-12-13 MED ORDER — FLUTICASONE FUROATE-VILANTEROL 200-25 MCG/INH IN AEPB: 1.0000 | INHALATION_SPRAY | Freq: Every day | RESPIRATORY_TRACT | 5 refills | Status: DC

## 2017-12-13 NOTE — Telephone Encounter (Signed)
Order placed

## 2017-12-13 NOTE — Telephone Encounter (Signed)
Patient agreed to do MRI lumbar spine.  Is this w, wo or w and wo?

## 2017-12-13 NOTE — Telephone Encounter (Signed)
Rx sent in. Please let patient know & apologize for the delay. Thank you, [name], MD

## 2017-12-13 NOTE — Telephone Encounter (Signed)
Pt informed. Deseree Blount, CMA  

## 2017-12-13 NOTE — Telephone Encounter (Signed)
-----   Message from Alda Berthold, DO sent at 12/12/2017  5:48 PM EST ----- Please call and informed patient that his nerve testing shows mild neuropathy in the legs and possible nerve impingement in the back. If his leg pain and cramps has not improved with muscle relaxants, I recommend we take a look with MRI of the lumbar spine to see if there is any new nerve pathology, even though he has had history of back surgery.

## 2017-12-13 NOTE — Telephone Encounter (Signed)
MRI lumbar spine wo contrast.  Thanks.

## 2017-12-26 ENCOUNTER — Telehealth: Payer: Self-pay | Admitting: Neurology

## 2017-12-26 ENCOUNTER — Ambulatory Visit: Payer: Medicare HMO | Admitting: Family Medicine

## 2017-12-26 ENCOUNTER — Encounter: Payer: Self-pay | Admitting: Family Medicine

## 2017-12-26 ENCOUNTER — Other Ambulatory Visit: Payer: Self-pay

## 2017-12-26 VITALS — BP 126/68 | HR 58 | Temp 97.5°F | Ht 67.0 in | Wt 163.4 lb

## 2017-12-26 DIAGNOSIS — L570 Actinic keratosis: Secondary | ICD-10-CM

## 2017-12-26 DIAGNOSIS — J449 Chronic obstructive pulmonary disease, unspecified: Secondary | ICD-10-CM | POA: Diagnosis not present

## 2017-12-26 DIAGNOSIS — J4489 Other specified chronic obstructive pulmonary disease: Secondary | ICD-10-CM

## 2017-12-26 DIAGNOSIS — E785 Hyperlipidemia, unspecified: Secondary | ICD-10-CM

## 2017-12-26 NOTE — Telephone Encounter (Signed)
Sent message to Es for PA.

## 2017-12-26 NOTE — Patient Instructions (Signed)
On your way out, schedule an appointment one morning to come back for fasting labs. Do not eat or drink anything other than water the morning of your lab appointment until after your labs are drawn.   Stop the flonase Switch to a "nasal saline spray" Let's see if this helps your nose at all.  Follow up with me in 6 months, sooner if needed.  Be well, Dr. Ardelia Mems

## 2017-12-26 NOTE — Telephone Encounter (Signed)
Patient wants to talk to someone about his ins approving MRI

## 2017-12-26 NOTE — Progress Notes (Signed)
Date of Visit: 12/26/2017   HPI:  Patient presents for follow up.  Seen by neurology for the numbness/tingling in his legs, as well as fasciculations seen last visit. Has follow up in place with them. Planning for MRI.  COPD/asthma - using breo and spirva regularly. Using albuterol up to 2-3 times per day, but not every day. Uses it especially when he works out. He runs a lot. Overall feels breathing is doing quite well on this regimen.  Nasal congestion - using flonase daily. Having issues with nosebleeds every other day. Not large volume. Patient is not sure flonase is helping him.  Hyperlipidemia - due for lipids. Not fasting today.  Spots on face - was seen in derm clinic here at [facility]. Did not think anything warranted biopsy. Has a few new dry spots. Wants to observe them for now, does not want to intervene at present.  ROS: See HPI.  [place]: history of asthma/copd overlap, AK, hyperlipidemia, lumbar spinal stenosis  PHYSICAL EXAM: BP 126/68   Pulse (!) 58   Temp (!) 97.5 F (36.4 C) (Oral)   Ht 5\' 7"  (1.702 m)   Wt 163 lb 6.4 oz (74.1 kg)   SpO2 97%   BMI 25.59 kg/m  Gen: no acute distress, pleasant, cooperative, well appearing HEENT: normocephalic, atraumatic, moist mucous membranes. Nares patent. No bleeding or blood seen in nostrils or around turbinates. No anterior cervical or supraclavicular lymphadenopathy.  Heart: regular rate and rhythm, no murmur Lungs: clear to auscultation bilaterally, normal work of breathing  Neuro: alert, grossly nonfocal, speech normal Ext: No appreciable lower extremity edema bilaterally  Skin: mildly dry spots on temples bilaterally  ASSESSMENT/PLAN:  Nosebleeds Possibly related to drying effect of flonase. Will stop flonase and switch to nasal saline spray. Follow up if not improving with this regimen.  Hyperlipidemia Return for fasting lipids & CMET at patient's convenience.  AK (actinic keratosis) New dry spots  on face, present <1 mo. Seen in derm clinic previously but without anything they felt needed treatment. Patient prefers to observe these spots for now. Monitor.   Asthma-COPD overlap syndrome ([place]) Well controlled. Continue current regimen.  Continue follow up with neurology for leg numbness/tremors  FOLLOW UP: Follow up in 6 mos for above issues  [name], [place]

## 2017-12-27 ENCOUNTER — Other Ambulatory Visit: Payer: Medicare HMO

## 2017-12-27 DIAGNOSIS — E785 Hyperlipidemia, unspecified: Secondary | ICD-10-CM

## 2017-12-28 LAB — CMP14+EGFR
A/G RATIO: 1.7 (ref 1.2–2.2)
ALT: 17 IU/L (ref 0–44)
AST: 30 IU/L (ref 0–40)
Albumin: 4.2 g/dL (ref 3.5–4.8)
Alkaline Phosphatase: 65 IU/L (ref 39–117)
BILIRUBIN TOTAL: 0.4 mg/dL (ref 0.0–1.2)
BUN / CREAT RATIO: 14 (ref 10–24)
BUN: 12 mg/dL (ref 8–27)
CHLORIDE: 104 mmol/L (ref 96–106)
CO2: 24 mmol/L (ref 20–29)
Calcium: 9.6 mg/dL (ref 8.6–10.2)
Creatinine, Ser: 0.86 mg/dL (ref 0.76–1.27)
GFR calc non Af Amer: 85 mL/min/{1.73_m2} (ref >59)
GFR, EST AFRICAN AMERICAN: 98 mL/min/{1.73_m2} (ref >59)
Globulin, Total: 2.5 g/dL (ref 1.5–4.5)
Glucose: 88 mg/dL (ref 65–99)
POTASSIUM: 4.4 mmol/L (ref 3.5–5.2)
Sodium: 141 mmol/L (ref 134–144)
TOTAL PROTEIN: 6.7 g/dL (ref 6.0–8.5)

## 2017-12-28 LAB — LIPID PANEL
CHOL/HDL RATIO: 3.4 ratio (ref 0.0–5.0)
Cholesterol, Total: 188 mg/dL (ref 100–199)
HDL: 55 mg/dL (ref >39)
LDL Calculated: 121 mg/dL — ABNORMAL HIGH (ref 0–99)
TRIGLYCERIDES: 60 mg/dL (ref 0–149)
VLDL Cholesterol Cal: 12 mg/dL (ref 5–40)

## 2017-12-30 NOTE — Assessment & Plan Note (Signed)
New dry spots on face, present <1 mo. Seen in derm clinic previously but without anything they felt needed treatment. Patient prefers to observe these spots for now. Monitor.

## 2017-12-30 NOTE — Assessment & Plan Note (Signed)
Well-controlled.  Continue current regimen. 

## 2017-12-30 NOTE — Assessment & Plan Note (Signed)
Return for fasting lipids & CMET at patient's convenience.

## 2018-01-01 ENCOUNTER — Ambulatory Visit
Admission: RE | Admit: 2018-01-01 | Discharge: 2018-01-01 | Disposition: A | Discharge status: Home / Self Care | Payer: Medicare HMO | Source: Ambulatory Visit | Attending: Neurology | Admitting: Neurology

## 2018-01-01 ENCOUNTER — Telehealth: Payer: Self-pay | Admitting: Neurology

## 2018-01-01 DIAGNOSIS — M5417 Radiculopathy, lumbosacral region: Secondary | ICD-10-CM

## 2018-01-01 DIAGNOSIS — M48061 Spinal stenosis, lumbar region without neurogenic claudication: Secondary | ICD-10-CM

## 2018-01-01 DIAGNOSIS — G629 Polyneuropathy, unspecified: Secondary | ICD-10-CM

## 2018-01-01 DIAGNOSIS — R253 Fasciculation: Secondary | ICD-10-CM

## 2018-01-01 DIAGNOSIS — R200 Anesthesia of skin: Secondary | ICD-10-CM

## 2018-01-01 DIAGNOSIS — G2581 Restless legs syndrome: Secondary | ICD-10-CM

## 2018-01-01 NOTE — Telephone Encounter (Signed)
Results of MRI lumbar spine, NCS/EMG, and labs discussed.  No signs of interval nerve impingement on imaging and post-surgical changes are stable.  NCS/EMG does not show any signs of motor neuron disease.  Recommend following symptoms.  He will return to see me in April, or sooner, if new weakness develops.   Brilyn Tuller K. Posey Pronto, DO

## 2018-01-10 ENCOUNTER — Telehealth: Payer: Self-pay | Admitting: Family Medicine

## 2018-01-10 NOTE — Telephone Encounter (Signed)
Called patient to review labs. No answer. LVM asking him to call back.  [name], MD

## 2018-01-31 ENCOUNTER — Other Ambulatory Visit: Payer: Self-pay

## 2018-01-31 MED ORDER — ALBUTEROL SULFATE HFA 108 (90 BASE) MCG/ACT IN AERS: 2.0000 | INHALATION_SPRAY | Freq: Four times a day (QID) | RESPIRATORY_TRACT | 11 refills | Status: DC | PRN

## 2018-02-28 ENCOUNTER — Encounter: Payer: Self-pay | Admitting: Family Medicine

## 2018-03-31 ENCOUNTER — Other Ambulatory Visit: Payer: Self-pay | Admitting: Neurology

## 2018-04-14 ENCOUNTER — Encounter: Payer: Self-pay | Admitting: Neurology

## 2018-04-14 ENCOUNTER — Ambulatory Visit: Payer: Medicare HMO | Admitting: Neurology

## 2018-04-14 VITALS — BP 110/76 | HR 92 | Ht 68.0 in | Wt 156.0 lb

## 2018-04-14 DIAGNOSIS — M48061 Spinal stenosis, lumbar region without neurogenic claudication: Secondary | ICD-10-CM | POA: Diagnosis not present

## 2018-04-14 DIAGNOSIS — G5621 Lesion of ulnar nerve, right upper limb: Secondary | ICD-10-CM | POA: Diagnosis not present

## 2018-04-14 DIAGNOSIS — G2581 Restless legs syndrome: Secondary | ICD-10-CM

## 2018-04-14 MED ORDER — TIZANIDINE HCL 2 MG PO TABS: 2.0000 mg | ORAL_TABLET | Freq: Every day | ORAL | 3 refills | Status: DC

## 2018-04-14 NOTE — Progress Notes (Signed)
Follow-up Visit   Date: 04/14/18    William [name] MRN: [id number] DOB: 1941-02-14   Interim History: William [name] is a 77 y.o. right-handed Caucasian history of lumbar spinal stenosis s/p decompression and laminectomy and fusion at L2-S1 returning to the clinic for follow-up of right leg numbness and muscle twitching.  The patient was accompanied to the clinic by self.  History of present illness: Starting around early 2007, he began having tingling and vibration sensation over the lower legs and numbness of the feet.  He complains of tightness and numbness of the feet.  He was started on gabapentin 600mg  at bedtime and compression socks which helps.  Symptoms are worse at nighttime and often wakes up having to walking around.  He denies any back pain. He has history of lumbar spinal stenosis s/p decompression and laminectomy and fusion at L2-S1 in 2014.  He also complains of cramps in the calves and muscle twitches. This has been present for many years.  He denies any weakness. He is very active and runs about 20-40 miles per week and a combination of 70-100 miles/week with running, stairstep, and biking.  He has noticed that previously he could start running immediately, but now has to start walking and transition into a job before he can run.  UPDATE 04/14/2018:  He is here for follow-up visit. His NCS confirmed the presence of neuropathy and MRI lumbar spine did not show any new changes, there is post-surgical changes and residual foraminal stenosis at L5-S1, worse on the right.   NCS/EMG did no show evidence of motor neuron disease.  He remains very active and hopes to train for a marathon.  He has adequate relief with gabapentin 900mg  at bedtime and tizanidine 2mg  at bedtime.  Over the past 30-40 years, he has numbness of the right hand when he is biking or using the elliptical machine.  He does not have any hand weakness.    Medications:  Current Outpatient Medications on File Prior  to Visit  Medication Sig Dispense Refill  . albuterol (PROVENTIL HFA;VENTOLIN HFA) 108 (90 Base) MCG/ACT inhaler Inhale 2 puffs into the lungs every 6 (six) hours as needed for wheezing or shortness of breath. 1 Inhaler 11  . aspirin EC 81 MG tablet Take 81 mg by mouth daily.    Marland Kitchen b complex vitamins tablet Take 1 tablet by mouth daily.    Marland Kitchen CALCIUM-MAGNESIUM-VITAMIN D PO Take 1 tablet by mouth.    . cetirizine (ZYRTEC) 10 MG tablet Take 1 tablet (10 mg total) by mouth daily. 90 tablet 1  . docusate sodium (COLACE) 100 MG capsule Take 100 mg by mouth daily.     . fluticasone (FLONASE) 50 MCG/ACT nasal spray Place 2 sprays into both nostrils daily. 16 g 6  . fluticasone furoate-vilanterol (BREO ELLIPTA) 200-25 MCG/INH AEPB Inhale 1 puff into the lungs daily. 28 each 5  . gabapentin (NEURONTIN) 300 MG capsule Take 3 capsules (900 mg total) by mouth at bedtime. 540 capsule 3  . Iron-Vitamins (GERITOL) LIQD Take 5 mLs by mouth.    . Multiple Vitamin (MULTIVITAMIN WITH MINERALS) TABS tablet Take 1 tablet by mouth daily.    . Omega-3 Fatty Acids (FISH OIL CONCENTRATE PO) Take 1 tablet by mouth.    . Potassium 99 MG TABS Take 1 tablet by mouth.    . tiotropium (SPIRIVA) 18 MCG inhalation capsule Place 1 capsule (18 mcg total) into inhaler and inhale daily. 30 capsule 5  No current facility-administered medications on file prior to visit.     Allergies:  Allergies  Allergen Reactions  . Hydrocodone Other (See Comments)    Causes constipation even with stool softner    Review of Systems:  CONSTITUTIONAL: No fevers, chills, night sweats, or weight loss.  EYES: No visual changes or eye pain ENT: No hearing changes.  No history of nose bleeds.   RESPIRATORY: No cough, wheezing and shortness of breath.   CARDIOVASCULAR: Negative for chest pain, and palpitations.   GI: Negative for abdominal discomfort, blood in stools or black stools.  No recent change in bowel habits.   GU:  No history of  incontinence.   MUSCLOSKELETAL: No history of joint pain or swelling.  No myalgias.   SKIN: Negative for lesions, rash, and itching.   ENDOCRINE: Negative for cold or heat intolerance, polydipsia or goiter.   PSYCH:  No depression or anxiety symptoms.   NEURO: As Above.   Vital Signs:  BP 110/76   Pulse 92   Ht 5\' 8"  (1.727 m)   Wt 156 lb (70.8 kg)   SpO2 95%   BMI 23.72 kg/m    General Medical Exam:   General:  Well appearing, comfortable  Eyes/ENT: see cranial nerve examination.   Neck: No masses appreciated.  Full range of motion without tenderness.  No carotid bruits. Respiratory:  Clear to auscultation, good air entry bilaterally.   Cardiac:  Regular rate and rhythm, no murmur.   Ext:  No edema  Neurological Exam: MENTAL STATUS including orientation to time, place, person, recent and remote memory, attention span and concentration, language, and fund of knowledge is normal.  Speech is not dysarthric.  CRANIAL NERVES: No visual field defects. Pupils equal round and reactive to light.  Normal conjugate, extra-ocular eye movements in all directions of gaze.  No ptosis.  Face is symmetric. Palate elevates symmetrically.  Tongue is midline.  MOTOR:  Motor strength is 5/5 in all extremities.  Active right calf fasciculations only.  No abnormal movements.  No pronator drift.  Tone is mildly increased in the legs (0+).    MSRs:  Right                                                                 Left brachioradialis 2+  brachioradialis 2+  biceps 2+  biceps 2+  triceps 2+  triceps 2+  patellar 1+  patellar 1+  ankle jerk 0  ankle jerk 0   SENSORY: Reduced temperature and vibration distal to ankles.  COORDINATION/GAIT:   Gait narrow based and stable.   Data: NCS/EMG of the legs 12/12/2018: 1. The electrophysiologic findings are most consistent with a sensorimotor polyneuropathy, axon loss in type, affecting the lower extremity. Overall, these findings are mild-to-moderate  in degree electrically. 2. There is a superimposed chronic L4 radiculopathy affecting bilateral lower extremities. 3. Specifically, there is no evidence of a widespread disorder of anterior horn cells.  MRI lumbar spine wo contrast 12/02/2013: 1. Interval surgery at L3-L4 and L4-L5. Improved thecal sac patency at the former, but at the latter there is multifactorial recurrent severe spinal stenosis, in part related to a posteriorly situated synovial cyst. 2. Moderate to severe bilateral L4 foraminal stenosis also appears mildly progressed and might  reflect interval increased facet hypertrophy. 3. Other lumbar levels are stable.  MRI lumbar spine wo contrast 01/01/2018: 1. Postsurgical changes related to prior L2-S1 posterior and interbody fusion, with residual moderate right and mild left neuroforaminal stenosis at L5-S1. Additional residual mild right neuroforaminal stenosis at L4-L5. No spinal canal stenosis at any level.  Labs 12/02/2017:  Ferritin 39.2, TSH 0.66, folate >23, vitamin B12 480, SPEP with IFE no M protein, copper 99    IMPRESSION/PLAN: 1.  Idiopathic peripheral neuropathy affecting the feet, confirmed by NCS.  Neuropathy labs are normal.  He is relatively asymptomatic and denies any imbalance or painful paresthesias. 2.  Fasciculations in the legs are due to lumbosacral radiculopathy.  He has history of lumbar spinal stenosis s/p decompression and laminectomy and fusion at L2-S1 which most explains his extensor response on the left and increased tone.  MRI lumbar spine from 01/01/2018 was reviewed and showed L2-S1 fusion and residual foraminal stenosis at L5-S1, worse on the right.   NCS/EMG did no show evidence of motor neuron disease.  3.  Right hand paresthesias, most likely due to ulnar neuropathy at the wrist from chronic compression when biking.   4.  Restless leg syndrome  PLAN/RECOMMENDATIONS:  Continue tizanidine 2mg  at bedtime Continue gabapentin 900mg  at  bedtime Encouraged leg stretching NCS/EMG for right hand paresthesias declined at this time, may reconsider if symptoms get worse.  Recommend using gloves to minimize trauma to the ulnar nerve at the wrist  Return to clinic in 9 months.   Thank you for allowing me to participate in patient's care.  If I can answer any additional questions, I would be pleased to do so.    Sincerely,    Melanni Benway K. Posey Pronto, DO

## 2018-04-14 NOTE — Patient Instructions (Signed)
Continue tizanidine 2mg  at bedtime Continue gabapentin 900mg  at bedtime  Return to clinic in 9 month

## 2018-05-23 ENCOUNTER — Ambulatory Visit (HOSPITAL_COMMUNITY)
Admission: RE | Admit: 2018-05-23 | Discharge: 2018-05-23 | Disposition: A | Discharge status: Home / Self Care | Payer: Medicare HMO | Source: Ambulatory Visit | Attending: Family Medicine | Admitting: Family Medicine

## 2018-05-23 ENCOUNTER — Other Ambulatory Visit: Payer: Self-pay

## 2018-05-23 ENCOUNTER — Encounter: Payer: Self-pay | Admitting: Family Medicine

## 2018-05-23 ENCOUNTER — Ambulatory Visit (INDEPENDENT_AMBULATORY_CARE_PROVIDER_SITE_OTHER): Payer: Medicare HMO | Admitting: Family Medicine

## 2018-05-23 VITALS — BP 138/80 | HR 63 | Temp 97.8°F | Ht 68.0 in | Wt 158.0 lb

## 2018-05-23 DIAGNOSIS — R55 Syncope and collapse: Secondary | ICD-10-CM | POA: Diagnosis not present

## 2018-05-23 DIAGNOSIS — J449 Chronic obstructive pulmonary disease, unspecified: Secondary | ICD-10-CM | POA: Insufficient documentation

## 2018-05-23 DIAGNOSIS — E785 Hyperlipidemia, unspecified: Secondary | ICD-10-CM | POA: Diagnosis not present

## 2018-05-23 DIAGNOSIS — Z0000 Encounter for general adult medical examination without abnormal findings: Secondary | ICD-10-CM | POA: Diagnosis not present

## 2018-05-23 DIAGNOSIS — R404 Transient alteration of awareness: Secondary | ICD-10-CM | POA: Diagnosis not present

## 2018-05-23 NOTE — Progress Notes (Signed)
Date of Visit: 05/23/2018   HPI:  Patient presents for routine follow up.  Lightheadedness - endorses feeling lightheaded for several months. Worse when he gets up in the morning. He works out a lot at [employer]. Last month he blacked out while working out and fell. Denies chest pain or shortness of breath. No injuries. No prior cardiac history.   COPD - taking spiriva and breo. Uses albuterol as needed. Insurance recently said it will no longer cover albuterol but he's going to just pay for it out of pocket. Feels breathing is doing well. Runs many miles per week.  ROS: See HPI.  [place]: history of actinic keratosis, hyperlipidemia, asthma/COPD  PHYSICAL EXAM: BP 138/80 (BP Location: Right Arm, Patient Position: Sitting, Cuff Size: Normal)   Pulse 63   Temp 97.8 F (36.6 C) (Oral)   Ht 5\' 8"  (1.727 m)   Wt 158 lb (71.7 kg)   SpO2 94%   BMI 24.02 kg/m  Gen: no acute distress, pleasant, cooperative, well appearing HEENT: normocephalic, atraumatic, moist mucous membranes  Heart: regular rate and rhythm, no murmur Lungs: clear to auscultation bilaterally, normal work of breathing  Neuro: alert, grossly nonfocal, speech normal Ext: No appreciable lower extremity edema bilaterally   ASSESSMENT/PLAN:  Health maintenance:  -current on HM items  Asthma-COPD overlap syndrome ([name]) Stable, continue inhaler regimen  Syncope Concerning that patient had a syncopal episode while exercising at the gym. EKG today unremarkable, as are orthostatics. Will refer to cardiology for further evaluation. Will not obtain echo at this time as he had one in 2017 - defer decision on repeating echo to cardiology. May need event monitoring or stress testing. Advised patient to take it easy and not work out so hard unitl he seems cardiology. Check BMET & CBC today.  FOLLOW UP: Referring to cardiology for syncope  [name], [place]

## 2018-05-23 NOTE — Patient Instructions (Addendum)
Referring you to a cardiologist, someone will call with an appointment Take it easy from now until you see them. Checking some labs as well  Be well, Dr. Ardelia Mems

## 2018-05-24 LAB — BASIC METABOLIC PANEL
BUN/Creatinine Ratio: 13 (ref 10–24)
BUN: 11 mg/dL (ref 8–27)
CO2: 24 mmol/L (ref 20–29)
CREATININE: 0.82 mg/dL (ref 0.76–1.27)
Calcium: 9.1 mg/dL (ref 8.6–10.2)
Chloride: 101 mmol/L (ref 96–106)
GFR calc Af Amer: 99 mL/min/{1.73_m2} (ref >59)
GFR calc non Af Amer: 86 mL/min/{1.73_m2} (ref >59)
GLUCOSE: 87 mg/dL (ref 65–99)
POTASSIUM: 4.3 mmol/L (ref 3.5–5.2)
SODIUM: 139 mmol/L (ref 134–144)

## 2018-05-24 LAB — CBC
HEMATOCRIT: 44 % (ref 37.5–51.0)
Hemoglobin: 14.4 g/dL (ref 13.0–17.7)
MCH: 30.1 pg (ref 26.6–33.0)
MCHC: 32.7 g/dL (ref 31.5–35.7)
MCV: 92 fL (ref 79–97)
PLATELETS: 266 10*3/uL (ref 150–450)
RBC: 4.79 x10E6/uL (ref 4.14–5.80)
RDW: 13.8 % (ref 12.3–15.4)
WBC: 6.1 10*3/uL (ref 3.4–10.8)

## 2018-05-24 LAB — TSH: TSH: 0.887 u[IU]/mL (ref 0.450–4.500)

## 2018-05-30 ENCOUNTER — Encounter: Payer: Self-pay | Admitting: Family Medicine

## 2018-06-01 DIAGNOSIS — R55 Syncope and collapse: Secondary | ICD-10-CM | POA: Insufficient documentation

## 2018-06-01 NOTE — Assessment & Plan Note (Signed)
Stable, continue inhaler regimen 

## 2018-06-01 NOTE — Assessment & Plan Note (Signed)
Concerning that patient had a syncopal episode while exercising at the gym. EKG today unremarkable, as are orthostatics. Will refer to cardiology for further evaluation. Will not obtain echo at this time as he had one in 2017 - defer decision on repeating echo to cardiology. May need event monitoring or stress testing. Advised patient to take it easy and not work out so hard unitl he seems cardiology. Check BMET & CBC today.

## 2018-06-11 ENCOUNTER — Other Ambulatory Visit: Payer: Self-pay | Admitting: Family Medicine

## 2018-06-12 ENCOUNTER — Encounter: Payer: Self-pay | Admitting: Internal Medicine

## 2018-06-12 ENCOUNTER — Ambulatory Visit: Payer: Medicare HMO | Admitting: Internal Medicine

## 2018-06-12 VITALS — BP 140/88 | HR 59 | Ht 67.0 in | Wt 161.0 lb

## 2018-06-12 DIAGNOSIS — R42 Dizziness and giddiness: Secondary | ICD-10-CM

## 2018-06-12 DIAGNOSIS — R0609 Other forms of dyspnea: Secondary | ICD-10-CM

## 2018-06-12 DIAGNOSIS — R55 Syncope and collapse: Secondary | ICD-10-CM | POA: Diagnosis not present

## 2018-06-12 DIAGNOSIS — G8929 Other chronic pain: Secondary | ICD-10-CM | POA: Insufficient documentation

## 2018-06-12 DIAGNOSIS — R51 Headache: Secondary | ICD-10-CM | POA: Diagnosis not present

## 2018-06-12 DIAGNOSIS — R519 Headache, unspecified: Secondary | ICD-10-CM | POA: Insufficient documentation

## 2018-06-12 NOTE — Patient Instructions (Addendum)
Medication Instructions:  Your physician recommends that you continue on your current medications as directed. Please refer to the Current Medication list given to you today.  -- If you need a refill on your cardiac medications before your next appointment, please call your pharmacy. --  Labwork: None ordered  Testing/Procedures:PLEASE SCHEDULE   Your physician has requested that you have an exercise tolerance test. For further information please visit [email or web address]. Please also follow instruction sheet, as given.  Your physician has recommended that you wear a 30 day event monitor. Event monitors are medical devices that record the heart's electrical activity. Doctors most often [place] these monitors to diagnose arrhythmias. Arrhythmias are problems with the speed or rhythm of the heartbeat. The monitor is a small, portable device. You can wear one while you do your normal daily activities. This is usually used to diagnose what is causing palpitations/syncope (passing out).   Follow-Up: Your physician wants you to follow-up in: 6-8 weeks with Dr. Saunders Revel.    Thank you for choosing [facility]!!    Any Other Special Instructions Will Be Listed Below (If Applicable). CONSULT YOUR NEUROLOGIST FOR YOUR DIZZINESS  NO DRIVING  Cardiac Event Monitoring A cardiac event monitor is a small recording device that is used to detect abnormal heart rhythms (arrhythmias). The monitor is used to record your heart rhythm when you have symptoms, such as:  Fast heartbeats (palpitations), such as heart racing or fluttering.  Dizziness.  Fainting or light-headedness.  Unexplained weakness.  Some monitors are wired to electrodes placed on your chest. Electrodes are flat, sticky disks that attach to your skin. Other monitors may be hand-held or worn on the wrist. The monitor can be worn for up to 30 days. If the monitor is attached to your chest, a technician will prepare your chest for the  electrode placement and show you how to work the monitor. Take time to practice using the monitor before you leave the office. Make sure you understand how to send the information from the monitor to your health care provider. In some cases, you may need to use a landline telephone instead of a cell phone. What are the risks? Generally, this device is safe to use, but it possible that the skin under the electrodes will become irritated. How to use your cardiac event monitor  Wear your monitor at all times, except when you are in water: ? Do not let the monitor get wet. ? Take the monitor off when you bathe. Do not swim or use a hot tub with it on.  Keep your skin clean. Do not put body lotion or moisturizer on your chest.  Change the electrodes as told by your health care provider or any time they stop sticking to your skin. You may need to use medical tape to keep them on.  Try to put the electrodes in slightly different places on your chest to help prevent skin irritation. They must remain in the area under your left breast and in the upper right section of your chest.  Make sure the monitor is safely clipped to your clothing or in a location close to your body that your health care provider recommends.  Press the button to record as soon as you feel heart-related symptoms, such as: ? Dizziness. ? Weakness. ? Light-headedness. ? Palpitations. ? Thumping or pounding in your chest. ? Shortness of breath. ? Unexplained weakness.  Keep a diary of your activities, such as walking, doing chores, and taking medicine. It is  very important to note what you were doing when you pushed the button to record your symptoms. This will help your health care provider determine what might be contributing to your symptoms.  Send the recorded information as recommended by your health care provider. It may take some time for your health care provider to process the results.  Change the batteries as told by  your health care provider.  Keep electronic devices away from your monitor. This includes: ? Tablets. ? MP3 players. ? Cell phones.  While wearing your monitor you should avoid: ? Electric blankets. ? Armed forces operational officer. ? Electric toothbrushes. ? Microwave ovens. ? Magnets. ? Metal detectors. Get help right away if:  You have chest pain.  You have extreme difficulty breathing or shortness of breath.  You develop a very fast heartbeat that persists.  You develop dizziness that does not go away.  You faint or constantly feel like you are about to faint. Summary  A cardiac event monitor is a small recording device that is used to help detect abnormal heart rhythms (arrhythmias).  The monitor is used to record your heart rhythm when you have heart-related symptoms.  Make sure you understand how to send the information from the monitor to your health care provider.  It is important to press the button on the monitor when you have any heart-related symptoms.  Keep a diary of your activities, such as walking, doing chores, and taking medicine. It is very important to note what you were doing when you pushed the button to record your symptoms. This will help your health care provider learn what might be causing your symptoms. This information is not intended to replace advice given to you by your health care provider. Make sure you discuss any questions you have with your health care provider. Document Released: 09/25/2008 Document Revised: 12/01/2016 Document Reviewed: 12/01/2016 Elsevier Interactive Patient Education  2017 [employer].
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
## 2018-06-13 ENCOUNTER — Telehealth: Payer: Self-pay | Admitting: Neurology

## 2018-06-13 NOTE — Telephone Encounter (Signed)
Patient lmom needing to let Dr. Posey Pronto know that he saw his Heart Doctor due to having dizzy spells and off balance. Thanks

## 2018-06-13 NOTE — Telephone Encounter (Signed)
Please schedule a f/u visit with me on 7/30 at 3:30pm. Thanks.

## 2018-06-13 NOTE — Telephone Encounter (Signed)
FYI

## 2018-06-16 NOTE — Addendum Note (Signed)
Addended by: Rose Phi on: 06/16/2018 03:29 PM   Modules accepted: Orders

## 2018-06-16 NOTE — Telephone Encounter (Signed)
Called patient and informed him that we would like to see him on July 30 at 3:30.

## 2018-06-19 ENCOUNTER — Ambulatory Visit (INDEPENDENT_AMBULATORY_CARE_PROVIDER_SITE_OTHER): Payer: Medicare HMO

## 2018-06-19 DIAGNOSIS — R55 Syncope and collapse: Secondary | ICD-10-CM

## 2018-06-19 DIAGNOSIS — R0609 Other forms of dyspnea: Secondary | ICD-10-CM | POA: Diagnosis not present

## 2018-06-19 LAB — EXERCISE TOLERANCE TEST
CHL CUP MPHR: 144 {beats}/min
CSEPED: 9 min
CSEPEDS: 19 s
CSEPHR: 106 %
Estimated workload: 10.6 METS
Peak HR: 153 {beats}/min
RPE: 54
Rest HR: 54 {beats}/min

## 2018-07-28 ENCOUNTER — Telehealth: Payer: Self-pay

## 2018-07-28 NOTE — Telephone Encounter (Signed)
Notes recorded by Frederik Schmidt, RN on 07/28/2018 at 9:50 AM EDT [email or web address] 7/29 ------

## 2018-07-28 NOTE — Telephone Encounter (Signed)
-----   Message from Nelva Bush, MD sent at 07/27/2018 10:47 PM EDT ----- Please let William [name] know that his monitor showed rare extra beats but no significant arrhythmias to explain his episodes of passing out.  He should follow-up in the office as planned to reassess his symptoms and discuss EP referral.

## 2018-07-29 ENCOUNTER — Encounter: Payer: Self-pay | Admitting: Neurology

## 2018-07-29 ENCOUNTER — Ambulatory Visit: Payer: Medicare HMO | Admitting: Neurology

## 2018-07-29 VITALS — BP 110/70 | HR 64 | Ht 67.0 in | Wt 163.5 lb

## 2018-07-29 DIAGNOSIS — G629 Polyneuropathy, unspecified: Secondary | ICD-10-CM

## 2018-07-29 DIAGNOSIS — R42 Dizziness and giddiness: Secondary | ICD-10-CM

## 2018-07-29 DIAGNOSIS — R55 Syncope and collapse: Secondary | ICD-10-CM | POA: Diagnosis not present

## 2018-07-29 NOTE — Progress Notes (Signed)
Follow-up Visit   Date: 07/29/18    William [name] MRN: [id number] DOB: 02/26/1941   Interim History: William [name] is a 77 y.o. right-handed Caucasian history of lumbar spinal stenosis s/p decompression and laminectomy and fusion at L2-S1 returning to the clinic for follow-up of right leg numbness and muscle twitching.  The patient was accompanied to the clinic by self.  History of present illness: Starting around early 2007, he noticed tingling and vibration sensation over the lower legs and numbness of the feet.  He complains of tightness and numbness of the feet.  Symptoms are worse at nighttime and often wakes up having to walking around.  He denies any back pain. He has history of lumbar spinal stenosis s/p decompression and laminectomy and fusion at L2-S1 in 2014.  He also complains of cramps in the calves and muscle twitches. This has been present for many years.  He denies any weakness. He is very active and runs about 20-40 miles per week and a combination of 70-100 miles/week with running, stairstep, and biking.    UPDATE 04/14/2018:  NCS confirmed the presence of neuropathy and MRI lumbar spine did not show any new changes, there is post-surgical changes and residual foraminal stenosis at L5-S1, worse on the right. He has adequate relief with gabapentin 900mg  at bedtime and tizanidine 2mg  at bedtime.  Over the past 30-40 years, he has numbness of the right hand when he is biking or using the elliptical machine.  He does not have any hand weakness.    UPDATE 07/29/2018:  He is here for follow-up visit for evaluation of dizziness.  He was being evaluated by his cardiologist for syncopal spells, lasting 3-5 seconds and mentioned that his dizziness has been getting worse over the past year.  Upon further questioning, he admits that there is no spinning, but complains more of lightheadedness and unsteadiness.  He also has vibratory sensation in his head.  No associated neck pain.   This  problems has been present for at least the past 40 years, but more intense over the past year.  Symptoms are not constant and alleviated by exercise.  These spells occur more when sitting or standing.  He self-discontinued gabapentin and tizandine thinking it could be medication related, but there was no difference, so restarted it again.  Regarding syncopal spells, these can occur sporadically without warning.  No associated limb jerking, tongue biting, or incontinence.  He had 30-day cardiac monitor which showed frequent PVCs, no arrhythmia. He has a follow-up visit next month to discuss EP referral.    Medications:  Current Outpatient Medications on File Prior to Visit  Medication Sig Dispense Refill  . albuterol (PROVENTIL HFA;VENTOLIN HFA) 108 (90 Base) MCG/ACT inhaler Inhale 2 puffs into the lungs every 6 (six) hours as needed for wheezing or shortness of breath. 1 Inhaler 11  . b complex vitamins tablet Take 1 tablet by mouth daily.    Marland Kitchen BREO ELLIPTA 200-25 MCG/INH AEPB INHALE 1 PUFF BY MOUTH ONCE DAILY 60 each 11  . CALCIUM-MAGNESIUM-VITAMIN D PO Take 1 tablet by mouth.    . cetirizine (ZYRTEC) 10 MG tablet Take 1 tablet (10 mg total) by mouth daily. 90 tablet 1  . docusate sodium (COLACE) 100 MG capsule Take 100 mg by mouth daily.     Marland Kitchen gabapentin (NEURONTIN) 300 MG capsule Take 3 capsules (900 mg total) by mouth at bedtime. 540 capsule 3  . Iron-Vitamins (GERITOL) LIQD Take 5 mLs by  mouth.    . Multiple Vitamin (MULTIVITAMIN WITH MINERALS) TABS tablet Take 1 tablet by mouth daily.    . Omega-3 Fatty Acids (FISH OIL CONCENTRATE PO) Take 1 tablet by mouth.    . SPIRIVA HANDIHALER 18 MCG inhalation capsule INHALE THE CONTENTS OF ONE CAPSULE IN THE HANDIHALER ONCE DAILY 30 capsule 11  . tiZANidine (ZANAFLEX) 2 MG tablet Take 1 tablet (2 mg total) by mouth at bedtime. 90 tablet 3   No current facility-administered medications on file prior to visit.     Allergies:  Allergies  Allergen  Reactions  . Hydrocodone Other (See Comments)    Causes constipation even with stool softner    Review of Systems:  CONSTITUTIONAL: No fevers, chills, night sweats, or weight loss.  EYES: No visual changes or eye pain ENT: No hearing changes.  No history of nose bleeds.   RESPIRATORY: No cough, wheezing and shortness of breath.   CARDIOVASCULAR: Negative for chest pain, and palpitations.   GI: Negative for abdominal discomfort, blood in stools or black stools.  No recent change in bowel habits.   GU:  No history of incontinence.   MUSCLOSKELETAL: No history of joint pain or swelling.  No myalgias.   SKIN: Negative for lesions, rash, and itching.   ENDOCRINE: Negative for cold or heat intolerance, polydipsia or goiter.   PSYCH:  No depression or anxiety symptoms.   NEURO: As Above.   Vital Signs:  BP 110/70   Pulse 64   Ht 5\' 7"  (1.702 m)   Wt 163 lb 8 oz (74.2 kg)   SpO2 95%   BMI 25.61 kg/m    General Medical Exam:   General:  Well appearing, comfortable  Eyes/ENT: see cranial nerve examination.   Neck: No masses appreciated.  Full range of motion without tenderness.  No carotid bruits. Respiratory:  Clear to auscultation, good air entry bilaterally.   Cardiac:  Regular rate and rhythm, no murmur.   Ext:  No edema  Neurological Exam: MENTAL STATUS including orientation to time, place, person, recent and remote memory, attention span and concentration, language, and fund of knowledge is normal.  Speech is not dysarthric.  CRANIAL NERVES: Pupils equal round and reactive to light.  Normal conjugate, extra-ocular eye movements in all directions of gaze.  No ptosis.  Face is symmetric. Palate elevates symmetrically.  Tongue is midline.  MOTOR:  Motor strength is 5/5 in all extremities.  Active right calf fasciculations only.    MSRs:  Right                                                                 Left brachioradialis 2+  brachioradialis 2+  biceps 2+  biceps 2+    triceps 2+  triceps 2+  patellar 1+  patellar 1+  ankle jerk 0  ankle jerk 0   SENSORY: Reduced temperature and vibration distal to ankles.  COORDINATION/GAIT:   Gait narrow based and stable.   Data: NCS/EMG of the legs 12/12/2018: 1. The electrophysiologic findings are most consistent with a sensorimotor polyneuropathy, axon loss in type, affecting the lower extremity. Overall, these findings are mild-to-moderate in degree electrically. 2. There is a superimposed chronic L4 radiculopathy affecting bilateral lower extremities. 3. Specifically, there is no evidence of  a widespread disorder of anterior horn cells.  MRI lumbar spine wo contrast 12/02/2013: 1. Interval surgery at L3-L4 and L4-L5. Improved thecal sac patency at the former, but at the latter there is multifactorial recurrent severe spinal stenosis, in part related to a posteriorly situated synovial cyst. 2. Moderate to severe bilateral L4 foraminal stenosis also appears mildly progressed and might reflect interval increased facet hypertrophy. 3. Other lumbar levels are stable.  MRI lumbar spine wo contrast 01/01/2018: 1. Postsurgical changes related to prior L2-S1 posterior and interbody fusion, with residual moderate right and mild left neuroforaminal stenosis at L5-S1. Additional residual mild right neuroforaminal stenosis at L4-L5. No spinal canal stenosis at any level.  Labs 12/02/2017:  Ferritin 39.2, TSH 0.66, folate >23, vitamin B12 480, SPEP with IFE no M protein, copper 99    IMPRESSION/PLAN: 1.  Dizziness/lightheadedness.  Chronic problem for many years and worsening over the past year.  Neuropathy can cause sensory ataxia, but this would be worse with activity, contrary to his description.  No signs of BPPV or orthostatic hypotension.  I will get MRI brain wo contrast and US carotids to exclude worrisome pathology, especially with his unexplained syncopal spells.  2.  Idiopathic peripheral neuropathy affecting the  feet, which could be contributing to his unsteadiness   3.  Fasciculations in the legs are due to lumbosacral radiculopathy.  He has history of lumbar spinal stenosis s/p decompression and laminectomy and fusion at L2-S1 which most explains his extensor response on the left and increased tone.  MRI lumbar spine from 01/01/2018 was reviewed and showed L2-S1 fusion and residual foraminal stenosis at L5-S1, worse on the right.   4.  Syncopal spells.  Unlikely seizure disorder.  Will check MRI brain, MRA head, and US carotids to evaluate for intracranial pathology, basilar stenosis, carotid artery disease.   He is undergoing cardiology evaluation  PLAN/RECOMMENDATIONS:  MRI/A brain wo contrast US carotids Continue tizanidine 2mg  at bedtime and gabapentin 900mg  at bedtime  Further recommendations pending results   Thank you for allowing me to participate in patient's care.  If I can answer any additional questions, I would be pleased to do so.    Sincerely,    Kosei Rhodes K. Posey Pronto, DO

## 2018-07-29 NOTE — Patient Instructions (Signed)
US carotids  MRI brain without contrast  We will call you with the results

## 2018-07-30 ENCOUNTER — Other Ambulatory Visit: Payer: Self-pay | Admitting: *Deleted

## 2018-07-30 DIAGNOSIS — R42 Dizziness and giddiness: Secondary | ICD-10-CM

## 2018-07-30 DIAGNOSIS — R55 Syncope and collapse: Secondary | ICD-10-CM

## 2018-08-12 ENCOUNTER — Ambulatory Visit: Payer: Medicare HMO | Admitting: Physician Assistant

## 2018-08-12 ENCOUNTER — Encounter: Payer: Self-pay | Admitting: Physician Assistant

## 2018-08-12 VITALS — BP 120/74 | HR 62 | Ht 67.0 in | Wt 163.8 lb

## 2018-08-12 DIAGNOSIS — R55 Syncope and collapse: Secondary | ICD-10-CM
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
## 2018-08-12 NOTE — Patient Instructions (Signed)
Medication Instructions:  Your physician recommends that you continue on your current medications as directed. Please refer to the Current Medication list given to you today.   Labwork: NONE ORDERED TODAY  Testing/Procedures: NONE ORDERED TODAY  Follow-Up: FOLLOW UP AS NEEDED  Any Other Special Instructions Will Be Listed Below (If Applicable).     If you need a refill on your cardiac medications before your next appointment, please call your pharmacy.

## 2018-08-21 ENCOUNTER — Telehealth: Payer: Self-pay | Admitting: Neurology

## 2018-08-21 ENCOUNTER — Ambulatory Visit
Admission: RE | Admit: 2018-08-21 | Discharge: 2018-08-21 | Disposition: A | Discharge status: Home / Self Care | Payer: Medicare HMO | Source: Ambulatory Visit | Attending: Neurology | Admitting: Neurology

## 2018-08-21 DIAGNOSIS — R55 Syncope and collapse: Secondary | ICD-10-CM | POA: Diagnosis not present

## 2018-08-21 DIAGNOSIS — R42 Dizziness and giddiness: Secondary | ICD-10-CM

## 2018-08-21 DIAGNOSIS — I6523 Occlusion and stenosis of bilateral carotid arteries: Secondary | ICD-10-CM | POA: Diagnosis not present

## 2018-08-21 NOTE — Telephone Encounter (Signed)
Results of US carotids and MRI/A head discussed with patient.  There is no intracranial or extracranial stenosis and no structural pathology of the brain which would cause his dizziness or syncopal spells.    US carotids:  Less than 50% in the right and left ICA MRA head:  Patent vessels MRI brain: Mild generalized atrophy and mild white matter changes, no stroke or mass.  With his neurological evaluation being non-diagnostic for his symptoms, he may need cardiac evaluation for arrhthymias.    Meiah Zamudio K. Posey Pronto, DO

## 2018-08-22 NOTE — Telephone Encounter (Signed)
Hi William [name],  It sounds like neuro evaluation has been unrevealing; his neurologist is recommending that we consider loop recorder placement.  Can you have [name] f/u with me or Richardson Dopp at his convenience to discuss ILR placement?  Thanks.  Gerald Stabs

## 2018-08-22 NOTE — Telephone Encounter (Signed)
Spoke to patient about arranging an OV to discuss a loop recorder placement.  The patient absolutely refused the loop recorder placement and has no interest coming in to discuss further treatment.  I told him to call us if we can be of any help.  He verbalized understanding.

## 2018-09-03 ENCOUNTER — Encounter: Payer: Self-pay | Admitting: Neurology

## 2018-10-01 ENCOUNTER — Ambulatory Visit (INDEPENDENT_AMBULATORY_CARE_PROVIDER_SITE_OTHER): Payer: Medicare HMO

## 2018-10-01 DIAGNOSIS — Z23 Encounter for immunization: Secondary | ICD-10-CM | POA: Diagnosis not present

## 2018-12-09 ENCOUNTER — Ambulatory Visit: Payer: Medicare HMO

## 2018-12-18 ENCOUNTER — Other Ambulatory Visit: Payer: Self-pay | Admitting: Neurology

## 2018-12-18 DIAGNOSIS — R253 Fasciculation: Secondary | ICD-10-CM

## 2019-01-13 ENCOUNTER — Ambulatory Visit (INDEPENDENT_AMBULATORY_CARE_PROVIDER_SITE_OTHER): Payer: Medicare HMO

## 2019-01-13 VITALS — BP 134/86 | HR 56 | Temp 97.9°F | Ht 67.0 in | Wt 161.4 lb

## 2019-01-13 DIAGNOSIS — Z0000 Encounter for general adult medical examination without abnormal findings: Secondary | ICD-10-CM | POA: Diagnosis not present

## 2019-01-13 NOTE — Progress Notes (Signed)
Subjective:   William [name] is a 78 y.o. male who presents for Medicare Annual/Subsequent preventive examination.  Review of Systems:  Physical assessment deferred to PCP.  Cardiac Risk Factors include: advanced age (>48men, >73 women);male gender     Objective:    Vitals: BP 134/86   Pulse (!) 56   Temp 97.9 F (36.6 C) (Oral)   Ht 5\' 7"  (1.702 m)   Wt 161 lb 6.4 oz (73.2 kg)   SpO2 97%   BMI 25.28 kg/m   Body mass index is 25.28 kg/m.  Advanced Directives 01/13/2019 05/23/2018 12/26/2017 09/18/2017 08/23/2017 07/16/2017 05/30/2017  Does Patient Have a Medical Advance Directive? No No No No No No No  Would patient like information on creating a medical advance directive? No - Patient declined No - Patient declined No - Patient declined No - Patient declined No - Patient declined;Yes (MAU/Ambulatory/Procedural Areas - Information given) Yes (MAU/Ambulatory/Procedural Areas - Information given) No - Patient declined    Tobacco Social History   Tobacco Use  Smoking Status Former Smoker  . Packs/day: 1.50  . Years: 30.00  . Pack years: 45.00  . Last attempt to quit: 04/02/1986  . Years since quitting: 32.8  Smokeless Tobacco Never Used  Tobacco Comment   no plans to start     Counseling given: No Comment: no plans to start   Clinical Intake:  Pre-visit preparation completed: Yes  Pain : No/denies pain Pain Score: 0-No pain     Nutritional Status: BMI of 19-24  Normal Nutritional Risks: None Diabetes: No  How often do you need to have someone help you when you read instructions, pamphlets, or other written materials from your doctor or pharmacy?: 1 - Never What is the last grade level you completed in school?: 2 years college  Interpreter Needed?: No     Past Medical History:  Diagnosis Date  . Bleeding gums 11/11/2015  . Dyslipidemia (high LDL; low HDL)   . Epistaxis 02/08/2017  . Ingrown left greater toenail 08/22/2015  . Low back pain 12/27/2014  .  Medial epicondylitis of right elbow 12/13/2014  . Numbness and tingling of right leg   . Tinnitus    Past Surgical History:  Procedure Laterality Date  . bone chip removed from right heel  1998  . FINGER AMPUTATION     injured by table saw, left 4th and 5th fingers  . KNEE ARTHROSCOPY W/ MENISCAL REPAIR  2008   left  . KNEE ARTHROSCOPY W/ MENISCAL REPAIR  11/2011   left  . LACERATION REPAIR  1962   forehead  . LUMBAR DISC SURGERY  09/2007   bone spurs, stenosis, L3-4, 4-5  . POSTERIOR LUMBAR FUSION 4 LEVEL N/A 10/13/2013   Procedure: Lumbar Two-Three, Lumbar Three-Four, Lumbar Four-Five, Lumbar Five-Sacral One posterior lumbar interbody fusion with interbody prosthesis posterior lateral arthrodesis and posterior segmental instrumentation;  Surgeon: [name], MD;  Location: [facility];  Service: Neurosurgery;  Laterality: N/A;  POSTERIOR LUMBAR FUSION 4 LEVEL  . SKIN GRAFT     from right thigh to left forehead  . SMALL INTESTINE SURGERY  3532   ileitis complications   Family History  Problem Relation Age of Onset  . Stroke Mother   . Heart disease Father   . Stroke Father   . Diabetes Father   . Obesity Brother   . Obesity Brother   . Cancer Maternal Grandfather   . Cancer Maternal Grandmother   . Drug abuse  Daughter    Social History   Socioeconomic History  . Marital status: Married    Spouse name: Alleen Borne  . Number of children: 3  . Years of education: 82  . Highest education level: Some college, no degree  Occupational History  . Occupation: Retired- Ambulance person  Social Needs  . Financial resource strain: Not hard at all  . Food insecurity:    Worry: Never true    Inability: Never true  . Transportation needs:    Medical: No    Non-medical: No  Tobacco Use  . Smoking status: Former Smoker    Packs/day: 1.50    Years: 30.00    Pack years: 45.00    Last attempt to quit: 04/02/1986    Years since quitting: 32.8  . Smokeless  tobacco: Never Used  . Tobacco comment: no plans to start  Substance and Sexual Activity  . Alcohol use: Not Currently    Comment: Only very rarely  . Drug use: No  . Sexual activity: Yes    Partners: Female  Lifestyle  . Physical activity:    Days per week: 7 days    Minutes per session: 120 min  . Stress: Not at all  Relationships  . Social connections:    Talks on phone: Never    Gets together: More than three times a week    Attends religious service: Never    Active member of club or organization: No    Attends meetings of clubs or organizations: Never    Relationship status: Married  Other Topics Concern  . Not on file  Social History Narrative   Emergency Contact: wife, Travon Crochet, [phone]   Who lives with you: Lives with 3rd wife and two sons   House is two level; has no trouble with the stairs. Smoke alarms present. No grab bars. No tripping hazards. Rugs are fastened down.   Any pets: poodle dog Location manager) and 10 parakeets   Diet: Patient has a varied diet and takes vitamin supplements. Uses exercise equipment at home (cycling, treadmill)    Exercise: Patient goings to [employer] 7 days a week, bike, strength training   Seatbelts: Patient reports wearing seatbelt occasionally   Hobbies: Running, exercising, watching TV, parakeets   Education: some college.                Outpatient Encounter Medications as of 01/13/2019  Medication Sig  . b complex vitamins tablet Take 1 tablet by mouth daily.  Marland Kitchen BREO ELLIPTA 200-25 MCG/INH AEPB INHALE 1 PUFF BY MOUTH ONCE DAILY  . diphenhydrAMINE (BENADRYL) 25 MG tablet Take 25 mg by mouth every 6 (six) hours as needed.  . docusate sodium (COLACE) 100 MG capsule Take 100 mg by mouth daily.   Marland Kitchen gabapentin (NEURONTIN) 300 MG capsule TAKE 3 CAPSULES BY MOUTH AT BEDTIME  . Iron-Vitamins (GERITOL) LIQD Take 5 mLs by mouth.  . Multiple Vitamin (MULTIVITAMIN WITH MINERALS) TABS tablet Take 1 tablet by mouth daily.  . Omega-3 Fatty  Acids (FISH OIL CONCENTRATE PO) Take 1 tablet by mouth.  . SPIRIVA HANDIHALER 18 MCG inhalation capsule INHALE THE CONTENTS OF ONE CAPSULE IN THE HANDIHALER ONCE DAILY  . tiZANidine (ZANAFLEX) 2 MG tablet Take 1 tablet (2 mg total) by mouth at bedtime.  Marland Kitchen VITAMIN C-VITAMIN D-ZINC PO Take by mouth.  Marland Kitchen albuterol (PROVENTIL HFA;VENTOLIN HFA) 108 (90 Base) MCG/ACT inhaler Inhale 2 puffs into the lungs every 6 (six) hours as needed for wheezing or  shortness of breath. (Patient not taking: Reported on 01/13/2019)  . CALCIUM-MAGNESIUM-VITAMIN D PO Take 1 tablet by mouth.  . cetirizine (ZYRTEC) 10 MG tablet Take 1 tablet (10 mg total) by mouth daily. (Patient not taking: Reported on 01/13/2019)   No facility-administered encounter medications on file as of 01/13/2019.     Activities of Daily Living In your present state of health, do you have any difficulty performing the following activities: 01/13/2019  Hearing? N  Vision? N  Difficulty concentrating or making decisions? N  Walking or climbing stairs? N  Dressing or bathing? N  Doing errands, shopping? N  Preparing Food and eating ? N  Using the Toilet? N  In the past six months, have you accidently leaked urine? N  Do you have problems with loss of bowel control? N  Managing your Medications? N  Managing your Finances? N  Housekeeping or managing your Housekeeping? N  Some recent data might be hidden    Patient Care Team: [name], MD as PCP - General (Family Medicine) End, Harrell Gave, MD as PCP - Cardiology (Cardiology) Marica Otter, [place] (Optometry) Alda Berthold, DO as Consulting Physician (Neurology)   Assessment:   This is a routine wellness examination for Haydin.  Exercise Activities and Dietary recommendations Current Exercise Habits: Home exercise routine;Structured exercise class, Type of exercise: strength training/weights;walking;treadmill;calisthenics, Time (Minutes): > 60, Frequency (Times/Week): 7, Weekly  Exercise (Minutes/Week): 0, Intensity: Moderate, Exercise limited by: orthopedic condition(s)  Goals    . Eat 4-5 vegetables a day    . Patient Stated     Improve time on 5K       Fall Risk Fall Risk  01/13/2019 07/29/2018 04/14/2018 12/26/2017 12/02/2017  Falls in the past year? 0 No Yes No Yes  Comment - - - - -  Number falls in past yr: - - 1 - 2 or more  Comment - - - - -  Injury with Fall? - - No - No  Risk Factor Category  - - - - -  Comment - - - - -  Risk for fall due to : - - - - Other (Comment)  Risk for fall due to: Comment - - - - -  Follow up - - Falls evaluation completed - Falls evaluation completed;Education provided;Falls prevention discussed   Is the patient's home free of loose throw rugs in walkways, pet beds, electrical cords, etc?   yes      Grab bars in the bathroom? no      Handrails on the stairs?   yes      Adequate lighting?   yes   Depression Screen PHQ 2/9 Scores 01/13/2019 05/23/2018 12/26/2017 09/18/2017  PHQ - 2 Score 0 0 0 0  PHQ- 9 Score - - - -    Cognitive Function MMSE - Mini Mental State Exam 01/13/2019 03/10/2015 07/28/2012 04/03/2011  Orientation to time 5 5 5 5   Orientation to Place 5 5 5 5   Registration 3 3 3 3   Attention/ Calculation 5 0 2 5  Recall 3 1 2 3   Language- name 2 objects 2 2 2 2   Language- repeat 1 1 1 1   Language- follow 3 step command 3 3 3 3   Language- read & follow direction 1 1 1 1   Write a sentence 1 1 1 1   Copy design 1 1 1 1   Total score 30 23 26 30      6CIT Screen 01/13/2019  What Year? 0 points  What  month? 0 points  What time? 0 points  Count back from 20 0 points  Months in reverse 0 points  Repeat phrase 0 points  Total Score 0    Immunization History  Administered Date(s) Administered  . Influenza Split 10/03/2011, 10/21/2012  . Influenza, High Dose Seasonal PF 08/23/2017  . Influenza,inj,Quad PF,6+ Mos 09/25/2013, 09/14/2016, 10/01/2018  . Influenza-Unspecified 10/03/2014, 10/23/2015  .  Pneumococcal Conjugate-13 03/10/2015  . Pneumococcal Polysaccharide-23 04/29/2007  . Td 04/03/2011  . Zoster 04/29/2007   Screening Tests Health Maintenance  Topic Date Due  . LIPID PANEL  12/27/2018  . TETANUS/TDAP  04/02/2021  . INFLUENZA VACCINE  Completed  . PNA vac Low Risk Adult  Completed   Cancer Screenings: Lung: Low Dose CT Chest recommended if Age 60-80 years, 30 pack-year currently smoking OR have quit w/in 15years. Patient does not qualify. Colorectal: annual FOBT    Plan:  Health maintenance up to date except for lipid profile. Patient has physical appt with PCP upcoming and will have drawn then. Not fasting today.   I have personally reviewed and noted the following in the patient's chart:   . Medical and social history . Use of alcohol, tobacco or illicit drugs  . Current medications and supplements . Functional ability and status . Nutritional status . Physical activity . Advanced directives . List of other physicians . Hospitalizations, surgeries, and ER visits in previous 12 months . Vitals . Screenings to include cognitive, depression, and falls . Referrals and appointments  In addition, I have reviewed and discussed with patient certain preventive protocols, quality metrics, and best practice recommendations. A written personalized care plan for preventive services as well as general preventive health recommendations were provided to patient.     Esau Grew, RN  01/13/2019

## 2019-01-13 NOTE — Patient Instructions (Signed)
Fall Prevention in the Home, Adult  Falls can cause injuries. They can happen to people of all ages. There are many things you can do to make your home safe and to help prevent falls. Ask for help when making these changes, if needed.  What actions can I take to prevent falls?  General Instructions  · Use good lighting in all rooms. Replace any light bulbs that burn out.  · Turn on the lights when you go into a dark area. Use night-lights.  · Keep items that you use often in easy-to-reach places. Lower the shelves around your home if necessary.  · Set up your furniture so you have a clear path. Avoid moving your furniture around.  · Do not have throw rugs and other things on the floor that can make you trip.  · Avoid walking on wet floors.  · If any of your floors are uneven, fix them.  · Add color or contrast paint or tape to clearly mark and help you see:  ? Any grab bars or handrails.  ? First and last steps of stairways.  ? Where the edge of each step is.  · If you use a stepladder:  ? Make sure that it is fully opened. Do not climb a closed stepladder.  ? Make sure that both sides of the stepladder are locked into place.  ? Ask someone to hold the stepladder for you while you use it.  · If there are any pets around you, be aware of where they are.  What can I do in the bathroom?         · Keep the floor dry. Clean up any water that spills onto the floor as soon as it happens.  · Remove soap buildup in the tub or shower regularly.  · Use non-skid mats or decals on the floor of the tub or shower.  · Attach bath mats securely with double-sided, non-slip rug tape.  · If you need to sit down in the shower, use a plastic, non-slip stool.  · Install grab bars by the toilet and in the tub and shower. Do not use towel bars as grab bars.  What can I do in the bedroom?  · Make sure that you have a light by your bed that is easy to reach.  · Do not use any sheets or blankets that are too big for your bed. They should  not hang down onto the floor.  · Have a firm chair that has side arms. You can use this for support while you get dressed.  What can I do in the kitchen?  · Clean up any spills right away.  · If you need to reach something above you, use a strong step stool that has a grab bar.  · Keep electrical cords out of the way.  · Do not use floor polish or wax that makes floors slippery. If you must use wax, use non-skid floor wax.  What can I do with my stairs?  · Do not leave any items on the stairs.  · Make sure that you have a light switch at the top of the stairs and the bottom of the stairs. If you do not have them, ask someone to add them for you.  · Make sure that there are handrails on both sides of the stairs, and use them. Fix handrails that are broken or loose. Make sure that handrails are as long as the stairways.  ·   Install non-slip stair treads on all stairs in your home.  · Avoid having throw rugs at the top or bottom of the stairs. If you do have throw rugs, attach them to the floor with carpet tape.  · Choose a carpet that does not hide the edge of the steps on the stairway.  · Check any carpeting to make sure that it is firmly attached to the stairs. Fix any carpet that is loose or worn.  What can I do on the outside of my home?  · Use bright outdoor lighting.  · Regularly fix the edges of walkways and driveways and fix any cracks.  · Remove anything that might make you trip as you walk through a door, such as a raised step or threshold.  · Trim any bushes or trees on the path to your home.  · Regularly check to see if handrails are loose or broken. Make sure that both sides of any steps have handrails.  · Install guardrails along the edges of any raised decks and porches.  · Clear walking paths of anything that might make someone trip, such as tools or rocks.  · Have any leaves, snow, or ice cleared regularly.  · Use sand or salt on walking paths during winter.  · Clean up any spills in your garage right  away. This includes grease or oil spills.  What other actions can I take?  · Wear shoes that:  ? Have a low heel. Do not wear high heels.  ? Have rubber bottoms.  ? Are comfortable and fit you well.  ? Are closed at the toe. Do not wear open-toe sandals.  · Use tools that help you move around (mobility aids) if they are needed. These include:  ? Canes.  ? Walkers.  ? Scooters.  ? Crutches.  · Review your medicines with your doctor. Some medicines can make you feel dizzy. This can increase your chance of falling.  Ask your doctor what other things you can do to help prevent falls.  Where to find more information  · Centers for Disease Control and Prevention, STEADI: https://cdc.gov  · National Institute on Aging: https://go4life.nia.nih.gov  Contact a doctor if:  · You are afraid of falling at home.  · You feel weak, drowsy, or dizzy at home.  · You fall at home.  Summary  · There are many simple things that you can do to make your home safe and to help prevent falls.  · Ways to make your home safe include removing tripping hazards and installing grab bars in the bathroom.  · Ask for help when making these changes in your home.  This information is not intended to replace advice given to you by your health care provider. Make sure you discuss any questions you have with your health care provider.  Document Released: 10/13/2009 Document Revised: 08/01/2017 Document Reviewed: 08/01/2017  Elsevier Interactive Patient Education © 2019 [employer]

## 2019-01-15 NOTE — Progress Notes (Signed)
I have reviewed this visit and agree with the documentation.  ?Paz Winsett J Cindel Daugherty, MD  ?

## 2019-01-15 NOTE — Progress Notes (Signed)
Follow-up Visit   Date: 01/16/19    William [name] MRN: [id number] DOB: 01/31/1941   Interim History: William [name] is a 78 y.o. right-handed Caucasian history of lumbar spinal stenosis s/p decompression and laminectomy and fusion at L2-S1 returning to the clinic for follow-up of right leg numbness and muscle twitching.  The patient was accompanied to the clinic by self.  History of present illness: Starting around early 2007, he noticed tingling and vibration sensation over the lower legs and numbness of the feet.  He complains of tightness and numbness of the feet.  Symptoms are worse at nighttime and often wakes up having to walking around.  He denies any back pain. He has history of lumbar spinal stenosis s/p decompression and laminectomy and fusion at L2-S1 in 2014.  He also complains of cramps in the calves and muscle twitches. This has been present for many years.  He denies any weakness. He is very active and runs about 20-40 miles per week and a combination of 70-100 miles/week with running, stairstep, and biking.    UPDATE 04/14/2018:  NCS confirmed the presence of neuropathy and MRI lumbar spine did not show any new changes, there is post-surgical changes and residual foraminal stenosis at L5-S1, worse on the right. He has adequate relief with gabapentin 900mg  at bedtime and tizanidine 2mg  at bedtime.  Over the past 30-40 years, he has numbness of the right hand when he is biking or using the elliptical machine.  He does not have any hand weakness.   UPDATE 07/29/2018:  He is here for follow-up visit for evaluation of dizziness.  He was being evaluated by his cardiologist for syncopal spells, lasting 3-5 seconds and mentioned that his dizziness has been getting worse over the past year.  Upon further questioning, he admits that there is no spinning, but complains more of lightheadedness and unsteadiness.  He also has vibratory sensation in his head.  No associated neck pain.   This  problems has been present for at least the past 40 years, but more intense over the past year.  Symptoms are not constant and alleviated by exercise.  These spells occur more when sitting or standing.  He self-discontinued gabapentin and tizandine thinking it could be medication related, but there was no difference, so restarted it again.  Regarding syncopal spells, these can occur sporadically without warning.  No associated limb jerking, tongue biting, or incontinence.  He had 30-day cardiac monitor which showed frequent PVCs, no arrhythmia. He has a follow-up visit next month to discuss EP referral.   UPDATE 01/15/2019:  He continues to take tizanidine 2mg  at bedtime which helps his cramps.  About twice per week, he takes 3mg  at bedtime.  He has not had any further syncopal spells and dizziness has markedly improved over the past several months.  MRI/A brain and ultrasound carotids has been normal.  He is requesting refills for medications.   Medications:  Current Outpatient Medications on File Prior to Visit  Medication Sig Dispense Refill  . albuterol (PROVENTIL HFA;VENTOLIN HFA) 108 (90 Base) MCG/ACT inhaler Inhale 2 puffs into the lungs every 6 (six) hours as needed for wheezing or shortness of breath. 1 Inhaler 11  . b complex vitamins tablet Take 1 tablet by mouth daily.    Marland Kitchen BREO ELLIPTA 200-25 MCG/INH AEPB INHALE 1 PUFF BY MOUTH ONCE DAILY 60 each 11  . CALCIUM-MAGNESIUM-VITAMIN D PO Take 1 tablet by mouth.    . cetirizine (ZYRTEC) 10  MG tablet Take 1 tablet (10 mg total) by mouth daily. 90 tablet 1  . diphenhydrAMINE (BENADRYL) 25 MG tablet Take 25 mg by mouth every 6 (six) hours as needed.    . docusate sodium (COLACE) 100 MG capsule Take 100 mg by mouth daily.     Marland Kitchen gabapentin (NEURONTIN) 300 MG capsule TAKE 3 CAPSULES BY MOUTH AT BEDTIME 540 capsule 3  . Iron-Vitamins (GERITOL) LIQD Take 5 mLs by mouth.    . Multiple Vitamin (MULTIVITAMIN WITH MINERALS) TABS tablet Take 1 tablet by  mouth daily.    . Omega-3 Fatty Acids (FISH OIL CONCENTRATE PO) Take 1 tablet by mouth.    . SPIRIVA HANDIHALER 18 MCG inhalation capsule INHALE THE CONTENTS OF ONE CAPSULE IN THE HANDIHALER ONCE DAILY 30 capsule 11  . VITAMIN C-VITAMIN D-ZINC PO Take by mouth.     No current facility-administered medications on file prior to visit.     Allergies:  Allergies  Allergen Reactions  . Hydrocodone Other (See Comments)    Causes constipation even with stool softner    Review of Systems:  CONSTITUTIONAL: No fevers, chills, night sweats, or weight loss.  EYES: No visual changes or eye pain ENT: No hearing changes.  No history of nose bleeds.   RESPIRATORY: No cough, wheezing and shortness of breath.   CARDIOVASCULAR: Negative for chest pain, and palpitations.   GI: Negative for abdominal discomfort, blood in stools or black stools.  No recent change in bowel habits.   GU:  No history of incontinence.   MUSCLOSKELETAL: No history of joint pain or swelling.  No myalgias.   SKIN: Negative for lesions, rash, and itching.   ENDOCRINE: Negative for cold or heat intolerance, polydipsia or goiter.   PSYCH:  No depression or anxiety symptoms.   NEURO: As Above.   Vital Signs:  BP 130/80   Pulse 62   Ht 5\' 7"  (1.702 m)   Wt 166 lb 8 oz (75.5 kg)   SpO2 97%   BMI 26.08 kg/m    General Medical Exam:   General:  Well appearing, comfortable  Eyes/ENT: see cranial nerve examination.   Neck: No masses appreciated.  Full range of motion without tenderness.  No carotid bruits. Respiratory:  Clear to auscultation, good air entry bilaterally.   Cardiac:  Regular rate and rhythm, no murmur.   Ext:  No edema   Neurological Exam: MENTAL STATUS including orientation to time, place, person, recent and remote memory, attention span and concentration, language, and fund of knowledge is normal.  Speech is not dysarthric.  CRANIAL NERVES: Pupils equal round and reactive to light.  Normal conjugate,  extra-ocular eye movements in all directions of gaze.  No ptosis.  Face is symmetric.   MOTOR:  Motor strength is 5/5 in all extremities.  Intermittent right calf fasciculations.  No muscle atrophy.    MSRs:  Right                                                                 Left brachioradialis 2+  brachioradialis 2+  biceps 2+  biceps 2+  triceps 2+  triceps 2+  patellar 1+  patellar 1+  ankle jerk 0  ankle jerk 0   SENSORY: Reduced temperature and vibration distal  to ankles.  COORDINATION/GAIT:   Gait narrow based and stable.   Data: NCS/EMG of the legs 12/12/2018: 1. The electrophysiologic findings are most consistent with a sensorimotor polyneuropathy, axon loss in type, affecting the lower extremity. Overall, these findings are mild-to-moderate in degree electrically. 2. There is a superimposed chronic L4 radiculopathy affecting bilateral lower extremities. 3. Specifically, there is no evidence of a widespread disorder of anterior horn cells.  MRI lumbar spine wo contrast 01/01/2018: 1. Postsurgical changes related to prior L2-S1 posterior and interbody fusion, with residual moderate right and mild left neuroforaminal stenosis at L5-S1. Additional residual mild right neuroforaminal stenosis at L4-L5. No spinal canal stenosis at any level.  Labs 12/02/2017:  Ferritin 39.2, TSH 0.66, folate >23, vitamin B12 480, SPEP with IFE no M protein, copper 99  US carotids:  Less than 50% in the right and left ICA MRA head:  Patent vessels MRI brain: Mild generalized atrophy and mild white matter changes, no stroke or mass.  IMPRESSION/PLAN: 1.  Fasciculations of the legs are due to lumbosacral radiculopathy.  He has history of lumbar spinal stenosis s/p decompression and laminectomy at L2-S1, with residual hypertonicity and extensor plantar response on the left.  Fasciculations and leg cramps are well controlled on tizanidine 2 mg at bedtime, he can take extra half tablet as needed.    Continue gabapentin 900 mg at bedtime.    2.  Idiopathic peripheral neuropathy affecting the feet, stable.  3.  Dizziness/lightheadedness - improved.  Chronic problem for many years and worsening over the past year.  Neurology work-up has been negative.  Return to clinic in 1 year   Thank you for allowing me to participate in patient's care.  If I can answer any additional questions, I would be pleased to do so.    Sincerely,     K. Posey Pronto, DO

## 2019-01-16 ENCOUNTER — Encounter: Payer: Self-pay | Admitting: Neurology

## 2019-01-16 ENCOUNTER — Ambulatory Visit (INDEPENDENT_AMBULATORY_CARE_PROVIDER_SITE_OTHER): Payer: Medicare HMO | Admitting: Neurology

## 2019-01-16 VITALS — BP 130/80 | HR 62 | Ht 67.0 in | Wt 166.5 lb

## 2019-01-16 DIAGNOSIS — M5417 Radiculopathy, lumbosacral region: Secondary | ICD-10-CM | POA: Diagnosis not present

## 2019-01-16 DIAGNOSIS — R253 Fasciculation: Secondary | ICD-10-CM

## 2019-01-16 MED ORDER — TIZANIDINE HCL 2 MG PO TABS: 2.0000 mg | ORAL_TABLET | Freq: Every day | ORAL | 3 refills | Status: DC

## 2019-01-16 NOTE — Patient Instructions (Addendum)
Return to clinic 1 year  

## 2019-03-10 ENCOUNTER — Ambulatory Visit (INDEPENDENT_AMBULATORY_CARE_PROVIDER_SITE_OTHER): Payer: Medicare HMO | Admitting: Family Medicine

## 2019-03-10 DIAGNOSIS — J449 Chronic obstructive pulmonary disease, unspecified: Secondary | ICD-10-CM | POA: Diagnosis not present

## 2019-03-10 DIAGNOSIS — J309 Allergic rhinitis, unspecified: Secondary | ICD-10-CM

## 2019-03-10 DIAGNOSIS — H9313 Tinnitus, bilateral: Secondary | ICD-10-CM | POA: Diagnosis not present

## 2019-03-10 DIAGNOSIS — R251 Tremor, unspecified: Secondary | ICD-10-CM | POA: Diagnosis not present

## 2019-03-10 MED ORDER — FLUTICASONE PROPIONATE 50 MCG/ACT NA SUSP: 2.0000 | Freq: Every day | NASAL | 6 refills | Status: DC

## 2019-03-10 NOTE — Patient Instructions (Signed)
   Stop breo Add flonase - sent in a prescription for you  Follow up with me in 6 wks  Be well, Dr. Ardelia Mems

## 2019-03-11 DIAGNOSIS — H9319 Tinnitus, unspecified ear: Secondary | ICD-10-CM | POA: Insufficient documentation

## 2019-03-11 NOTE — Assessment & Plan Note (Signed)
Currently just on zyrtec and using afrin nasal spray. Advised afrin to be used only 3 days in a row max. Will add flonase nasal spray as I suspect allergic rhinitis is contributing to his chronic cough. Will need to monitor his symptoms closely since we are stopping his breo at patient's request.

## 2019-03-11 NOTE — Progress Notes (Signed)
Date of Visit: 03/10/2019   HPI:  Patient presents to discuss several issues. This visit was scheduled as a physical, but given patient had several additional specific complaints to discuss we elected to push his physical to another appointment.  COPD - currently taking breo 1 puff once daily and spiriva 68mcg daily. Does not use albuterol at all, as his insurance stopped paying for it. Overall feels like his breathing does well. He is very physically active and works out a lot. He thinks the breo is causing him to have a very bad taste in his mouth and make his mouth dry. He wants to try to come of the medication.   R hand tremor - has noticed a tremor in his right hand for about the past year. Says he mentioned it to his neurologist and not much was said or done about it. It is causing him difficulty with writing and makes his handwriting difficult to read. Comes and goes, with random onset.   Tinnitus in ears - has ringing in both ears. Mostly bothers him at night when it is more quiet. Does not feel like he has trouble hearing.  Cough - has a chronic dry cough for the past year, getting worse overall. Lots of nasal congestion as well. Uses afrin nasal spray on and off. Also takes zyrtec 10mg  daily but no other allergy medications.   ROS: See HPI.  [place]: history of hyperlipidemia, BPH, spinal stenosis, asthma-COPD overlap  PHYSICAL EXAM: BP 118/65   Pulse 66   Temp 97.7 F (36.5 C) (Oral)   Wt 165 lb 3.2 oz (74.9 kg)   SpO2 94%   BMI 25.87 kg/m  Gen: no acute distress, pleasant, cooperative, well appearing HEENT: normocephalic, atraumatic, moist mucous membranes. Tympanic membranes clear bilaterally. Oropharynx clear and moist. Nares patent. Heart: regular rate and rhythm, no murmur Lungs: clear to auscultation bilaterally, normal work of breathing  Neuro: alert, grossly nonfocal, speech normal. Very mild shakiness with FNF testing bilaterally, but equal bilaterally. Grip 5/5  bilaterally.  Ext: No appreciable lower extremity edema bilaterally   ASSESSMENT/PLAN:  Allergic rhinitis Currently just on zyrtec and using afrin nasal spray. Advised afrin to be used only 3 days in a row max. Will add flonase nasal spray as I suspect allergic rhinitis is contributing to his chronic cough. Will need to monitor his symptoms closely since we are stopping his breo at patient's request.  Asthma-COPD overlap syndrome ([place]) Patient prefers to stop breo due to side effects of dry mouth. He has no shortness of breath, just a chronic cough and I suspect allergies may be contributing. We are adding intranasal fluticasone to help with allergies. Reviewed results of prior PFTs which showed mild restrictive lung pattern with reversibility. If symptoms not controlled at follow up could consider referring to pulmonology.  Tinnitus Mostly bothersome at night. Tympanic membranes normal on exam today. Suspect this is related to age/presbycusis. Offered audiology evaluation to patient, but he says symptoms are not that bothersome at this time. Encouraged use of white noise machine to help him sleep at night.  Tremor of right hand Continues to complain of tremor in R hand. No tremor on exam today. Has mild bilateral shaking of hands on finger-nose-finger but otherwise no tremor. He is established with neurology, but I don't see that this has been discussed in his visits with them. I advised patient to call neurology office to schedule with them - may need neurological testing to evaluate tremor.  FOLLOW UP:  Follow up in 6 weeks for above issues.  [place]. Ardelia Mems, [place]

## 2019-03-11 NOTE — Assessment & Plan Note (Signed)
Mostly bothersome at night. Tympanic membranes normal on exam today. Suspect this is related to age/presbycusis. Offered audiology evaluation to patient, but he says symptoms are not that bothersome at this time. Encouraged use of white noise machine to help him sleep at night.

## 2019-03-11 NOTE — Assessment & Plan Note (Signed)
Patient prefers to stop breo due to side effects of dry mouth. He has no shortness of breath, just a chronic cough and I suspect allergies may be contributing. We are adding intranasal fluticasone to help with allergies. Reviewed results of prior PFTs which showed mild restrictive lung pattern with reversibility. If symptoms not controlled at follow up could consider referring to pulmonology.

## 2019-03-11 NOTE — Assessment & Plan Note (Signed)
Continues to complain of tremor in R hand. No tremor on exam today. Has mild bilateral shaking of hands on finger-nose-finger but otherwise no tremor. He is established with neurology, but I don't see that this has been discussed in his visits with them. I advised patient to call neurology office to schedule with them - may need neurological testing to evaluate tremor.

## 2019-03-20 ENCOUNTER — Other Ambulatory Visit: Payer: Self-pay | Admitting: Neurology

## 2019-04-21 ENCOUNTER — Ambulatory Visit: Payer: Medicare HMO | Admitting: Family Medicine

## 2019-05-11 ENCOUNTER — Ambulatory Visit: Payer: Medicare HMO | Admitting: Family Medicine

## 2019-05-19 ENCOUNTER — Encounter: Payer: Medicare HMO | Admitting: Family Medicine

## 2019-06-17 ENCOUNTER — Other Ambulatory Visit: Payer: Self-pay | Admitting: *Deleted

## 2019-06-18 MED ORDER — SPIRIVA HANDIHALER 18 MCG IN CAPS: ORAL_CAPSULE | RESPIRATORY_TRACT | 1 refills | Status: DC

## 2019-07-18 ENCOUNTER — Other Ambulatory Visit: Payer: Self-pay | Admitting: Family Medicine

## 2019-09-14 ENCOUNTER — Ambulatory Visit (INDEPENDENT_AMBULATORY_CARE_PROVIDER_SITE_OTHER): Payer: Medicare HMO | Admitting: *Deleted

## 2019-09-14 ENCOUNTER — Other Ambulatory Visit: Payer: Self-pay

## 2019-09-14 DIAGNOSIS — Z23 Encounter for immunization: Secondary | ICD-10-CM | POA: Diagnosis not present

## 2019-09-14 NOTE — Progress Notes (Signed)
Pt tolerated vaccine well. Deseree Blount, CMA  

## 2019-12-14 ENCOUNTER — Ambulatory Visit (INDEPENDENT_AMBULATORY_CARE_PROVIDER_SITE_OTHER): Payer: Medicare HMO | Admitting: Family Medicine

## 2019-12-14 ENCOUNTER — Encounter: Payer: Self-pay | Admitting: Family Medicine

## 2019-12-14 ENCOUNTER — Telehealth: Payer: Self-pay | Admitting: Neurology

## 2019-12-14 ENCOUNTER — Other Ambulatory Visit: Payer: Self-pay

## 2019-12-14 VITALS — BP 136/70 | HR 79 | Wt 166.0 lb

## 2019-12-14 DIAGNOSIS — M79644 Pain in right finger(s): Secondary | ICD-10-CM | POA: Diagnosis not present

## 2019-12-14 MED ORDER — TIZANIDINE HCL 4 MG PO TABS: 4.0000 mg | ORAL_TABLET | Freq: Every day | ORAL | 1 refills | Status: DC

## 2019-12-14 MED ORDER — NAPROXEN 500 MG PO TABS: 500.0000 mg | ORAL_TABLET | Freq: Two times a day (BID) | ORAL | 0 refills | Status: AC

## 2019-12-14 NOTE — Patient Instructions (Signed)
Hand Exercises °Hand exercises can be helpful for almost anyone. These exercises can strengthen the hands, improve flexibility and movement, and increase blood flow to the hands. These results can make work and daily tasks easier. Hand exercises can be especially helpful for people who have joint pain from arthritis or have nerve damage from overuse (carpal tunnel syndrome). These exercises can also help people who have injured a hand. °Exercises °Most of these hand exercises are gentle stretching and motion exercises. It is usually safe to do them often throughout the day. Warming up your hands before exercise may help to reduce stiffness. You can do this with gentle massage or by placing your hands in warm water for 10-15 minutes. °It is normal to feel some stretching, pulling, tightness, or mild discomfort as you begin new exercises. This will gradually improve. Stop an exercise right away if you feel sudden, severe pain or your pain gets worse. Ask your health care provider which exercises are best for you. °Knuckle bend or "claw" fist °1. Stand or sit with your arm, hand, and all five fingers pointed straight up. Make sure to keep your wrist straight during the exercise. °2. Gently bend your fingers down toward your palm until the tips of your fingers are touching the top of your palm. Keep your big knuckle straight and just bend the small knuckles in your fingers. °3. Hold this position for __________ seconds. °4. Straighten (extend) your fingers back to the starting position. °Repeat this exercise 5-10 times with each hand. °Full finger fist °1. Stand or sit with your arm, hand, and all five fingers pointed straight up. Make sure to keep your wrist straight during the exercise. °2. Gently bend your fingers into your palm until the tips of your fingers are touching the middle of your palm. °3. Hold this position for __________ seconds. °4. Extend your fingers back to the starting position, stretching every  joint fully. °Repeat this exercise 5-10 times with each hand. °Straight fist °1. Stand or sit with your arm, hand, and all five fingers pointed straight up. Make sure to keep your wrist straight during the exercise. °2. Gently bend your fingers at the big knuckle, where your fingers meet your hand, and the middle knuckle. Keep the knuckle at the tips of your fingers straight and try to touch the bottom of your palm. °3. Hold this position for __________ seconds. °4. Extend your fingers back to the starting position, stretching every joint fully. °Repeat this exercise 5-10 times with each hand. °Tabletop °1. Stand or sit with your arm, hand, and all five fingers pointed straight up. Make sure to keep your wrist straight during the exercise. °2. Gently bend your fingers at the big knuckle, where your fingers meet your hand, as far down as you can while keeping the small knuckles in your fingers straight. Think of forming a tabletop with your fingers. °3. Hold this position for __________ seconds. °4. Extend your fingers back to the starting position, stretching every joint fully. °Repeat this exercise 5-10 times with each hand. °Finger spread °1. Place your hand flat on a table with your palm facing down. Make sure your wrist stays straight as you do this exercise. °2. Spread your fingers and thumb apart from each other as far as you can until you feel a gentle stretch. Hold this position for __________ seconds. °3. Bring your fingers and thumb tight together again. Hold this position for __________ seconds. °Repeat this exercise 5-10 times with each hand. °  Making circles °1. Stand or sit with your arm, hand, and all five fingers pointed straight up. Make sure to keep your wrist straight during the exercise. °2. Make a circle by touching the tip of your thumb to the tip of your index finger. °3. Hold for __________ seconds. Then open your hand wide. °4. Repeat this motion with your thumb and each finger on your  hand. °Repeat this exercise 5-10 times with each hand. °Thumb motion °1. Sit with your forearm resting on a table and your wrist straight. Your thumb should be facing up toward the ceiling. Keep your fingers relaxed as you move your thumb. °2. Lift your thumb up as high as you can toward the ceiling. Hold for __________ seconds. °3. Bend your thumb across your palm as far as you can, reaching the tip of your thumb for the small finger (pinkie) side of your palm. Hold for __________ seconds. °Repeat this exercise 5-10 times with each hand. °Grip strengthening ° °1. Hold a stress ball or other soft ball in the middle of your hand. °2. Slowly increase the pressure, squeezing the ball as much as you can without causing pain. Think of bringing the tips of your fingers into the middle of your palm. All of your finger joints should bend when doing this exercise. °3. Hold your squeeze for __________ seconds, then relax. °Repeat this exercise 5-10 times with each hand. °Contact a health care provider if: °· Your hand pain or discomfort gets much worse when you do an exercise. °· Your hand pain or discomfort does not improve within 2 hours after you exercise. °If you have any of these problems, stop doing these exercises right away. Do not do them again unless your health care provider says that you can. °Get help right away if: °· You develop sudden, severe hand pain or swelling. If this happens, stop doing these exercises right away. Do not do them again unless your health care provider says that you can. °This information is not intended to replace advice given to you by your health care provider. Make sure you discuss any questions you have with your health care provider. °Document Released: 11/28/2015 Document Revised: 04/09/2019 Document Reviewed: 12/18/2018 °Elsevier Patient Education © 2020 [employer] ° °

## 2019-12-14 NOTE — Telephone Encounter (Signed)
Patient want [place] to call in his muscle relaxer medication to the [facility] on [address]. He is out of refills and would like to know if we can call him a stronger one please call

## 2019-12-14 NOTE — Telephone Encounter (Signed)
He is on a low dose, we can increase the dose to tizandine 4mg  at bedtime.

## 2019-12-14 NOTE — Progress Notes (Signed)
     Subjective:     Patient ID: William [name], male   DOB: 09/30/1941, 78 y.o.   MRN: [id number]  Hand Pain  Incident onset: C/O right thumb pain and swelling x 3 weeks. The incident occurred at home. There was no injury mechanism.  Pain location: Right thumb.  The quality of the pain is described as aching. Radiates to: Sometimes to the right shoulder. The pain is at a severity of 3/10 (When he uses his hand, pain is about about 7/10). The pain is moderate. The pain has been fluctuating since the incident. Pertinent negatives include no muscle weakness, numbness or tingling. The symptoms are aggravated by movement and lifting. He has tried NSAIDs and acetaminophen (Advil and Tylenol) for the symptoms. The treatment provided mild relief.     Review of Systems  Respiratory: Negative.   Cardiovascular: Negative.   Gastrointestinal: Negative.   Musculoskeletal: Positive for arthralgias and joint swelling.  Neurological: Negative for tingling and numbness.  All other systems reviewed and are negative.  Vitals:   12/14/19 0942  BP: 136/70  Pulse: 79  SpO2: 97%  Weight: 166 lb (75.3 kg)       Objective:   Physical Exam Vitals and nursing note reviewed.  Constitutional:      Appearance: He is not ill-appearing.  Cardiovascular:     Rate and Rhythm: Normal rate and regular rhythm.     Heart sounds: Normal heart sounds. No murmur.  Pulmonary:     Effort: Pulmonary effort is normal. No respiratory distress.     Breath sounds: Normal breath sounds. No wheezing.  Abdominal:     General: Abdomen is flat. Bowel sounds are normal. There is no distension.     Palpations: Abdomen is soft.     Tenderness: There is no abdominal tenderness.  Musculoskeletal:     Right elbow: Normal.     Left elbow: Normal.     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 Reduced ROM of right thumb. No swelling, no erythema. + tenderness of the right 1st metacarpophalengeal joint.  Neurological:   Mental Status: He is alert.        Assessment:     Right thumb pain: Likely arthritis    Plan:     Trial of Naproxen for 14 days. F/U to see PCP soon if there is no improvement. Consider Xray at that point if needed. Warm compression and messaging discussed. Home thumb and hand ROM exercise instruction provided. F/U as needed.

## 2019-12-14 NOTE — Telephone Encounter (Signed)
Patient takes Tizanidine 2mg  at hs and occasionally takes 3mg , wants stronger Muscle relaxer

## 2019-12-14 NOTE — Telephone Encounter (Signed)
Patient notified to check at pharmacy for increase change in dose

## 2020-01-05 ENCOUNTER — Ambulatory Visit (INDEPENDENT_AMBULATORY_CARE_PROVIDER_SITE_OTHER): Payer: Medicare HMO | Admitting: Family Medicine

## 2020-01-05 ENCOUNTER — Other Ambulatory Visit: Payer: Self-pay

## 2020-01-05 VITALS — BP 124/80 | HR 66 | Wt 172.6 lb

## 2020-01-05 DIAGNOSIS — M79644 Pain in right finger(s): Secondary | ICD-10-CM | POA: Diagnosis not present

## 2020-01-05 DIAGNOSIS — E785 Hyperlipidemia, unspecified: Secondary | ICD-10-CM

## 2020-01-05 DIAGNOSIS — J449 Chronic obstructive pulmonary disease, unspecified: Secondary | ICD-10-CM

## 2020-01-05 MED ORDER — FLUTICASONE PROPIONATE 50 MCG/ACT NA SUSP: 2.0000 | Freq: Every day | NASAL | 6 refills | Status: AC

## 2020-01-05 MED ORDER — NAPROXEN 500 MG PO TABS: 500.0000 mg | ORAL_TABLET | Freq: Two times a day (BID) | ORAL | 0 refills | Status: DC | PRN

## 2020-01-05 NOTE — Progress Notes (Signed)
Date of Visit: 01/05/2020   HPI:  William [name] presents today for follow up.   Right thumb pain: Patient was seen 2 weeks ago in clinic for aching right thumb pain with ~ 1 month duration aggravated by uses of his hand. Today, patient notes continued right thumb pain most notably at the base of his thumb (near the 1st metacarpal joint) that presents with severe pain when he "presses against it." The pain begins right when he wakes up from his sleep. In the morning he quantifies his pain as 8/10. As the day goes on and he takes his naproxen the pain level is 4-6/10. His pain is worst when he extends his thumb. Patient also notes clicking of interphalangeal joint in his same thumb. Ocassionaly, his thumb gets stuck in a flexed position at this joint. He denies notable nodules, tingling, numbness or weakness of his right hand or thumb.   Asthma-COPD overlap: Patient notes he stopped taking tiotropium and diphenhydramine and has not noticed a significant difference in his work of breathing. Notes continued occasional shortness of breath that does not impact his daily routine.  Allergies Needs flonase refilled. Using with good relief of symptoms.   PHYSICAL EXAM: BP 124/80   Pulse 66   Wt 172 lb 9.6 oz (78.3 kg)   SpO2 97%   BMI 27.03 kg/m  Gen: Well appearing man who appears his stated age comfortable and conversing appropriately  Heart: Regular rate and rhythm, normal s1, s2 no murmur  Lungs: Normal work of breathing, clear to auscultation bilaterally  Neuro: A/O x 3 Musculoskeletal: Mild swelling at base of right thumb. No erythema or nodules. No warmth. Tenderness to palpation of 1st metacarpal joint and carpometacarpal joint. Normal range of motion of right thumb. Pain with thumb extension.   ASSESSMENT/PLAN:  Health maintenance:  - Lipid panel care gap  Right thumb pain: Persistent focal right thumb pain in the region of the metacarpal joint superimposed on likely osteoarthritis of right hand  that responded to treatment with naproxen. Although naproxen seems to help with patient's right thumb pain, he notes persistent pain with an element of focality at the right 1st metacarpal joint.  In addition, patient notes interphalangeal joint clicking which is sometimes stuck in the flexed position. Presentation is consistent for a possible right 1st metacarpal joint cyst causing interphalangeal joint flexor tenosynovitis. Want to avoid long term naproxen if possible given age. Would benefit from seeing hand specialist. Patient agreeable. - referral to a hand specialist  - continue naproxen for pain relief until he is seen.  Hyperlipidemia nonfasting today so could not get lipids, will check at future visit  Asthma-COPD overlap syndrome ([place]) Doing well off spiriva. Continue to monitor.  Allergic rhinitis - refill flonase  William [name], [place]  Patient seen along with [employer] student William [name]. I personally evaluated this patient along with the student, and verified all aspects of the history, physical exam, and medical decision making as documented by the student. I agree with the student's documentation and have made all necessary edits.  Chrisandra Netters, MD [place]

## 2020-01-05 NOTE — Patient Instructions (Signed)
It was great to see you again today!  Referring to hand specialist Refilled flonase  Call with any questions  Be well, Dr. Ardelia Mems

## 2020-01-08 NOTE — Assessment & Plan Note (Signed)
Doing well off spiriva. Continue to monitor.

## 2020-01-08 NOTE — Assessment & Plan Note (Signed)
nonfasting today so could not get lipids, will check at future visit

## 2020-01-13 ENCOUNTER — Encounter: Payer: Self-pay | Admitting: Neurology

## 2020-01-14 ENCOUNTER — Other Ambulatory Visit: Payer: Self-pay

## 2020-01-14 ENCOUNTER — Telehealth (INDEPENDENT_AMBULATORY_CARE_PROVIDER_SITE_OTHER): Payer: Medicare HMO | Admitting: Neurology

## 2020-01-14 ENCOUNTER — Encounter: Payer: Self-pay | Admitting: Family Medicine

## 2020-01-14 DIAGNOSIS — R253 Fasciculation: Secondary | ICD-10-CM

## 2020-01-14 DIAGNOSIS — M5417 Radiculopathy, lumbosacral region: Secondary | ICD-10-CM | POA: Diagnosis not present

## 2020-01-14 MED ORDER — GABAPENTIN 300 MG PO CAPS: 900.0000 mg | ORAL_CAPSULE | Freq: Every day | ORAL | 3 refills | Status: DC

## 2020-01-14 MED ORDER — TIZANIDINE HCL 4 MG PO TABS: 4.0000 mg | ORAL_TABLET | Freq: Every day | ORAL | 3 refills | Status: DC

## 2020-01-14 NOTE — Progress Notes (Signed)
   Due to the COVID-19 crisis, this virtual check-in visit was done via telephone from my office and it was initiated and consent given by this patient and or family.   Telephone (Audio) Visit The purpose of this telephone visit is to provide medical care while limiting exposure to the novel coronavirus.    Consent was obtained for telephone visit and initiated by pt/family:  Yes.   Answered questions that patient had about telehealth interaction:  Yes.   I discussed the limitations, risks, security and privacy concerns of performing an evaluation and management service by telephone. I also discussed with the patient that there may be a patient responsible charge related to this service. The patient expressed understanding and agreed to proceed.  Pt location: Home Physician Location: office Name of referring provider:  [name], MD I connected with .William [name] at patients initiation/request on 01/14/2020 at  9:50 AM EST by telephone and verified that I am speaking with the correct person using two identifiers.  Pt MRN:  [id number] Pt DOB:  March 03, 1941   History of Present Illness: This is a 79 year old man returning for follow-up of fasciculations, muscle cramps, and neuropathy.  Over the past year, his fasciculations and cramps seem to be well controlled and gabapentin 900mg  at bedtime and tizanidine 4mg  at bedtime.  Does report that increasing his tizanidine from 2 mg to 4 mg at the last visit did provide benefit. His legs only now bother him about 1-2 times per week.  He has not had any falls and denies any new numbness or tingling of the legs.Marland Kitchen  He continues to have intermittent dizziness in the morning, which is slowly getting better. 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  No interval illnesses or hospitalizations.    Assessment and Plan:   1.  Cramps and fasciculations of the legs due to lumbosacral radiculopathy.  He has history of lumbar spinal  stenosis s/p correction and laminectomy at L2-S1, with residual hypertonicity and extensor plantar response on the left.  Symptoms are well controlled on combination of gabapentin 900 mg at bedtime and tizanidine 4 mg at bedtime.  Refills for 1 year will be provided.  2.  Idiopathic peripheral neuropathy affecting the feet, stable and he is relatively asymptomatic with respect to this.  3.  Dizziness/lightheadedness, intermittent.  Neurology work-up has been negative.   Follow Up Instructions:   I discussed the assessment and treatment plan with the patient. The patient was provided an opportunity to ask questions and all were answered. The patient agreed with the plan and demonstrated an understanding of the instructions.   The patient was advised to call back or seek an in-person evaluation if the symptoms worsen or if the condition fails to improve as anticipated.  Return to clinic in 1 year  Total Time spent in visit with the patient was:  8 min, of which 100% of the time was spent in counseling and/or coordinating care.   Pt understands and agrees with the plan of care outlined.     Alda Berthold, DO

## 2020-01-14 NOTE — Addendum Note (Signed)
Addended by: [name] on: 01/14/2020 10:36 AM   Modules accepted: Orders

## 2020-01-18 ENCOUNTER — Telehealth: Payer: Medicare HMO | Admitting: Neurology

## 2020-01-27 DIAGNOSIS — M65311 Trigger thumb, right thumb: Secondary | ICD-10-CM | POA: Insufficient documentation

## 2020-01-27 DIAGNOSIS — R52 Pain, unspecified: Secondary | ICD-10-CM | POA: Insufficient documentation

## 2020-01-27 DIAGNOSIS — M47812 Spondylosis without myelopathy or radiculopathy, cervical region: Secondary | ICD-10-CM | POA: Diagnosis not present

## 2020-01-27 DIAGNOSIS — G8929 Other chronic pain: Secondary | ICD-10-CM | POA: Insufficient documentation

## 2020-01-27 DIAGNOSIS — M1811 Unilateral primary osteoarthritis of first carpometacarpal joint, right hand: Secondary | ICD-10-CM | POA: Diagnosis not present

## 2020-01-27 DIAGNOSIS — R200 Anesthesia of skin: Secondary | ICD-10-CM | POA: Insufficient documentation

## 2020-01-29 ENCOUNTER — Other Ambulatory Visit: Payer: Self-pay

## 2020-01-29 DIAGNOSIS — R200 Anesthesia of skin: Secondary | ICD-10-CM

## 2020-02-16 ENCOUNTER — Other Ambulatory Visit: Payer: Self-pay

## 2020-02-16 ENCOUNTER — Ambulatory Visit (INDEPENDENT_AMBULATORY_CARE_PROVIDER_SITE_OTHER): Payer: Medicare HMO | Admitting: Neurology

## 2020-02-16 DIAGNOSIS — R200 Anesthesia of skin: Secondary | ICD-10-CM | POA: Diagnosis not present

## 2020-02-16 DIAGNOSIS — G5603 Carpal tunnel syndrome, bilateral upper limbs: Secondary | ICD-10-CM

## 2020-02-16 DIAGNOSIS — M5412 Radiculopathy, cervical region: Secondary | ICD-10-CM

## 2020-02-16 NOTE — Procedures (Signed)
[facility]  [place], [address]  [place], [place] [address] Tel: [phone] Fax:  [phone] Test Date:  02/16/2020  Patient: William [name] DOB: 05-Apr-1941 Physician: Narda Amber, DO  Sex: Male Height: 5\' 7"  Ref Phys: Daryll Brod, MD  ID#: [id number] Temp: 32.0C Technician:    Patient Complaints: This is a 79 year old man referred for evaluation of bilateral hand numbness and tingling, worse on the right.  NCV & EMG Findings: Extensive electrodiagnostic testing of the right upper extremity and additional studies of the left shows:  1. Bilateral median sensory responses show prolonged latency (R3.9, L3.9 ms) and reduced amplitude (R5.7, L3.2 V).  Bilateral ulnar sensory responses are within normal limits.   2. Right median motor response shows prolonged latency (4.2 ms).  Left median motor responses within normal limits.  Of note, there is evidence of a left Martin-Gruber anastomoses.  Bilateral ulnar motor responses are within normal limits. 3. Chronic motor axonal loss changes are seen affecting the first dorsal interosseous, extensor indicis proprius, triceps, and abductor pollicis brevis muscles bilaterally.  There is no evidence of accompanied active denervation.  Impression: 1. Bilateral median neuropathy at or distal to the wrist, consistent with a clinical diagnosis of carpal tunnel syndrome.  Overall, these findings are moderate in degree electrically, and worse on the right. 2. Chronic C8 radiculopathy affecting bilateral upper extremities, mild. 3. Incidentally, there is evidence of a left Martin-Gruber anastomosis, a normal anatomic variant.   ___________________________ Narda Amber, DO    Nerve Conduction Studies Anti Sensory Summary Table   Site NR Peak (ms) Norm Peak (ms) P-T Amp (V) Norm P-T Amp  Left Median Anti Sensory (2nd Digit)  32C  Wrist    3.9 <3.8 3.2 >10  Right Median Anti Sensory (2nd Digit)  32C  Wrist    3.9 <3.8 5.7 >10   Left Ulnar Anti Sensory (5th Digit)  32C  Wrist    2.8 <3.2 8.6 >5  Right Ulnar Anti Sensory (5th Digit)  32C  Wrist    3.1 <3.2 8.8 >5   Motor Summary Table   Site NR Onset (ms) Norm Onset (ms) O-P Amp (mV) Norm O-P Amp Site1 Site2 Delta-0 (ms) Dist (cm) Vel (m/s) Norm Vel (m/s)  Left Median Motor (Abd Poll Brev)  32C  Wrist    3.5 <4.0 5.5 >5 Elbow Wrist 5.4 29.0 54 >50  Elbow    8.9  5.7  Ulnar-wrist crossover Elbow 4.5 0.0    Ulnar-wrist crossover    4.4  2.5         Right Median Motor (Abd Poll Brev)  32C  Wrist    4.2 <4.0 7.7 >5 Elbow Wrist 5.4 28.0 52 >50  Elbow    9.6  7.5         Left Ulnar Motor (Abd Dig Minimi)  32C  Wrist    2.5 <3.1 8.0 >7 B Elbow Wrist 4.2 22.0 52 >50  B Elbow    6.7  7.5  A Elbow B Elbow 1.9 10.0 53 >50  A Elbow    8.6  7.3         Right Ulnar Motor (Abd Dig Minimi)  32C  Wrist    2.3 <3.1 8.6 >7 B Elbow Wrist 4.2 22.0 52 >50  B Elbow    6.5  8.4  A Elbow B Elbow 1.9 10.0 53 >50  A Elbow    8.4  8.1  EMG   Side Muscle Ins Act Fibs Psw Fasc Number Recrt Dur Dur. Amp Amp. Poly Poly. Comment  Right 1stDorInt Nml Nml Nml Nml 1- Rapid Some 1+ Few 1+ Nml Nml N/A  Right Abd Poll Brev Nml Nml Nml Nml 1- Rapid Some 1+ Some 1+ Nml Nml N/A  Right Ext Indicis Nml Nml Nml Nml 1- Rapid Few 1+ Few 1+ Nml Nml N/A  Right PronatorTeres Nml Nml Nml Nml Nml Nml Nml Nml Nml Nml Nml Nml N/A  Right Biceps Nml Nml Nml Nml Nml Nml Nml Nml Nml Nml Nml Nml N/A  Right Triceps Nml Nml Nml Nml 1- Rapid Some 1+ Some 1+ Nml Nml N/A  Right Deltoid Nml Nml Nml Nml Nml Nml Nml Nml Nml Nml Nml Nml N/A  Left 1stDorInt Nml Nml Nml Nml 1- Rapid Some 1+ Some 1+ Nml Nml N/A  Left Abd Poll Brev Nml Nml Nml Nml 1- Rapid Some 1+ Some 1+ Nml Nml N/A  Left PronatorTeres Nml Nml Nml Nml Nml Nml Nml Nml Nml Nml Nml Nml N/A  Left Biceps Nml Nml Nml Nml Nml Nml Nml Nml Nml Nml Nml Nml N/A  Left Triceps Nml Nml Nml Nml 1- Rapid Some 1+ Some 1+ Nml Nml N/A  Left Deltoid Nml Nml  Nml Nml Nml Nml Nml Nml Nml Nml Nml Nml N/A  Left Ext Indicis Nml Nml Nml Nml 1- Rapid Few 1+ Few 1+ Nml Nml N/A      Waveforms:

## 2020-02-24 DIAGNOSIS — G5603 Carpal tunnel syndrome, bilateral upper limbs: Secondary | ICD-10-CM | POA: Diagnosis not present

## 2020-02-24 DIAGNOSIS — M65311 Trigger thumb, right thumb: Secondary | ICD-10-CM | POA: Diagnosis not present

## 2020-02-24 DIAGNOSIS — M5412 Radiculopathy, cervical region: Secondary | ICD-10-CM | POA: Diagnosis not present

## 2020-03-08 ENCOUNTER — Other Ambulatory Visit: Payer: Self-pay | Admitting: Neurology

## 2020-03-08 DIAGNOSIS — R253 Fasciculation: Secondary | ICD-10-CM

## 2020-04-01 DIAGNOSIS — M5030 Other cervical disc degeneration, unspecified cervical region: Secondary | ICD-10-CM | POA: Diagnosis not present

## 2020-04-01 DIAGNOSIS — M542 Cervicalgia: Secondary | ICD-10-CM | POA: Diagnosis not present

## 2020-04-06 DIAGNOSIS — G5603 Carpal tunnel syndrome, bilateral upper limbs: Secondary | ICD-10-CM | POA: Diagnosis not present

## 2020-04-06 DIAGNOSIS — M65311 Trigger thumb, right thumb: Secondary | ICD-10-CM | POA: Diagnosis not present

## 2020-07-15 DIAGNOSIS — G5603 Carpal tunnel syndrome, bilateral upper limbs: Secondary | ICD-10-CM | POA: Diagnosis not present

## 2020-09-21 ENCOUNTER — Encounter: Payer: Self-pay | Admitting: Family Medicine

## 2020-09-21 ENCOUNTER — Ambulatory Visit (INDEPENDENT_AMBULATORY_CARE_PROVIDER_SITE_OTHER): Payer: Medicare HMO | Admitting: Family Medicine

## 2020-09-21 ENCOUNTER — Other Ambulatory Visit: Payer: Self-pay

## 2020-09-21 VITALS — BP 130/78 | HR 88 | Ht 65.0 in | Wt 167.8 lb

## 2020-09-21 DIAGNOSIS — R310 Gross hematuria: Secondary | ICD-10-CM | POA: Diagnosis not present

## 2020-09-21 DIAGNOSIS — M25512 Pain in left shoulder: Secondary | ICD-10-CM

## 2020-09-21 DIAGNOSIS — R319 Hematuria, unspecified: Secondary | ICD-10-CM | POA: Diagnosis not present

## 2020-09-21 DIAGNOSIS — Z23 Encounter for immunization: Secondary | ICD-10-CM | POA: Diagnosis not present

## 2020-09-21 LAB — POCT URINALYSIS DIP (MANUAL ENTRY)
Bilirubin, UA: NEGATIVE
Glucose, UA: NEGATIVE mg/dL
Ketones, POC UA: NEGATIVE mg/dL
Leukocytes, UA: NEGATIVE
Nitrite, UA: NEGATIVE
Protein Ur, POC: NEGATIVE mg/dL
Spec Grav, UA: 1.02 (ref 1.010–1.025)
Urobilinogen, UA: 1 E.U./dL
pH, UA: 6.5 (ref 5.0–8.0)

## 2020-09-21 NOTE — Assessment & Plan Note (Signed)
Daily gross hematuria for 2-1/2 months.  History of BPH.  Otherwise asymptomatic.  Urinalysis pending.  CBC, CMP.  Urology referral.  CT abdomen pelvis set up.  Follow-up set up.

## 2020-09-21 NOTE — Progress Notes (Signed)
    CHIEF COMPLAINT / HPI: 2 or 2-1/2 months of gross hematuria pretty much daily.  Light pink.  Sometimes sees some dried blood at the end of the penis.  No pain.  No burning on urination.  Seems a little worse if he has worked strenuously the day before.  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  He likes that as it gets him out side.  Denies fever, denies unusual weight change, no change in appetite or energy level.  No back pain.  2.  Left shoulder pain: This is been going on since he injured it a couple of months ago.  Having difficulty elevating above shoulder height.   PERTINENT  PMH / PSH: I have reviewed the patient's medications, allergies, past medical and surgical history, smoking status and updated in the EMR as appropriate. History of BPH.  OBJECTIVE:  BP 130/78   Pulse 88   Ht 5\' 5"  (1.651 m)   Wt 167 lb 12.8 oz (76.1 kg)   SpO2 94%   BMI 27.92 kg/m   GENERAL: Well-developed thin male no acute distress ABDOMEN: Soft, positive bowel sounds nontender nondistended no rebound or guarding. Back: No CVA Tenderness.  No Deformity Noted. LUNGS: No unusual work of breathing. Extremity: No lower extremity edema. Pelvis: No unusual tenderness or mass in the pelvis to palpation.   ASSESSMENT / PLAN:   Gross hematuria Daily gross hematuria for 2-1/2 months.  History of BPH.  Otherwise asymptomatic.  Urinalysis pending.  CBC, CMP.  Urology referral.  CT abdomen pelvis set up.  Follow-up set up.  Acute pain of left shoulder There is some concern for partial rotator cuff tear.  Due to his other issues that were attending to today which are much more urgent, I defer this to follow-up with either me or his PCP in the next 3 to 4 weeks.  He is fine with that approach.   Dorcas Mcmurray MD

## 2020-09-21 NOTE — Patient Instructions (Signed)
Today were going to get some blood work and urinalysis.    I am putting in a referral to urology.  They should call you for an appointment.  If they have not called you at your home in the next 7 to 10 days, please call my office and leave me a message about that.    I am also setting you up for a CT scan.  I will send you results of your blood work and your CT scan.    I would like to see you back in the office or you can come back to see your regular doctor in about 4 weeks and then we can look at your left shoulder.  We can also go over results.  If your symptoms get worse or you have new symptoms, please call let me know that.

## 2020-09-21 NOTE — Assessment & Plan Note (Signed)
There is some concern for partial rotator cuff tear.  Due to his other issues that were attending to today which are much more urgent, I defer this to follow-up with either me or his PCP in the next 3 to 4 weeks.  He is fine with that approach.

## 2020-09-22 LAB — COMPREHENSIVE METABOLIC PANEL
ALT: 20 IU/L (ref 0–44)
AST: 34 IU/L (ref 0–40)
Albumin/Globulin Ratio: 1.4 (ref 1.2–2.2)
Albumin: 4.2 g/dL (ref 3.7–4.7)
Alkaline Phosphatase: 66 IU/L (ref 44–121)
BUN/Creatinine Ratio: 7 — ABNORMAL LOW (ref 10–24)
BUN: 5 mg/dL — ABNORMAL LOW (ref 8–27)
Bilirubin Total: 0.3 mg/dL (ref 0.0–1.2)
CO2: 24 mmol/L (ref 20–29)
Calcium: 8.9 mg/dL (ref 8.6–10.2)
Chloride: 97 mmol/L (ref 96–106)
Creatinine, Ser: 0.76 mg/dL (ref 0.76–1.27)
GFR calc Af Amer: 101 mL/min/{1.73_m2} (ref >59)
GFR calc non Af Amer: 87 mL/min/{1.73_m2} (ref >59)
Globulin, Total: 3.1 g/dL (ref 1.5–4.5)
Glucose: 94 mg/dL (ref 65–99)
Potassium: 4.4 mmol/L (ref 3.5–5.2)
Sodium: 137 mmol/L (ref 134–144)
Total Protein: 7.3 g/dL (ref 6.0–8.5)

## 2020-09-22 LAB — CBC
Hematocrit: 41.9 % (ref 37.5–51.0)
Hemoglobin: 14.5 g/dL (ref 13.0–17.7)
MCH: 30.7 pg (ref 26.6–33.0)
MCHC: 34.6 g/dL (ref 31.5–35.7)
MCV: 89 fL (ref 79–97)
Platelets: 194 10*3/uL (ref 150–450)
RBC: 4.72 x10E6/uL (ref 4.14–5.80)
RDW: 12.2 % (ref 11.6–15.4)
WBC: 3.9 10*3/uL (ref 3.4–10.8)

## 2020-09-23 ENCOUNTER — Other Ambulatory Visit: Payer: Self-pay

## 2020-09-23 ENCOUNTER — Emergency Department (HOSPITAL_COMMUNITY)
Admission: EM | Admit: 2020-09-23 | Discharge: 2020-09-23 | Disposition: A | Discharge status: Home / Self Care | Payer: Medicare HMO | Attending: Emergency Medicine | Admitting: Emergency Medicine

## 2020-09-23 ENCOUNTER — Emergency Department (HOSPITAL_COMMUNITY): Payer: Medicare HMO

## 2020-09-23 ENCOUNTER — Telehealth: Payer: Self-pay | Admitting: Family Medicine

## 2020-09-23 ENCOUNTER — Encounter (HOSPITAL_COMMUNITY): Payer: Self-pay | Admitting: Emergency Medicine

## 2020-09-23 DIAGNOSIS — M62830 Muscle spasm of back: Secondary | ICD-10-CM

## 2020-09-23 DIAGNOSIS — G8929 Other chronic pain: Secondary | ICD-10-CM | POA: Diagnosis not present

## 2020-09-23 DIAGNOSIS — I700 Atherosclerosis of aorta: Secondary | ICD-10-CM | POA: Diagnosis not present

## 2020-09-23 DIAGNOSIS — J45909 Unspecified asthma, uncomplicated: Secondary | ICD-10-CM | POA: Insufficient documentation

## 2020-09-23 DIAGNOSIS — Z7951 Long term (current) use of inhaled steroids: Secondary | ICD-10-CM | POA: Insufficient documentation

## 2020-09-23 DIAGNOSIS — U071 COVID-19: Secondary | ICD-10-CM | POA: Diagnosis not present

## 2020-09-23 DIAGNOSIS — M545 Low back pain: Secondary | ICD-10-CM | POA: Insufficient documentation

## 2020-09-23 DIAGNOSIS — Z87891 Personal history of nicotine dependence: Secondary | ICD-10-CM | POA: Diagnosis not present

## 2020-09-23 DIAGNOSIS — J849 Interstitial pulmonary disease, unspecified: Secondary | ICD-10-CM | POA: Diagnosis not present

## 2020-09-23 DIAGNOSIS — R42 Dizziness and giddiness: Secondary | ICD-10-CM | POA: Diagnosis not present

## 2020-09-23 DIAGNOSIS — Z85038 Personal history of other malignant neoplasm of large intestine: Secondary | ICD-10-CM | POA: Diagnosis not present

## 2020-09-23 DIAGNOSIS — Z23 Encounter for immunization: Secondary | ICD-10-CM | POA: Diagnosis not present

## 2020-09-23 DIAGNOSIS — J449 Chronic obstructive pulmonary disease, unspecified: Secondary | ICD-10-CM | POA: Insufficient documentation

## 2020-09-23 DIAGNOSIS — M791 Myalgia, unspecified site: Secondary | ICD-10-CM | POA: Diagnosis present

## 2020-09-23 DIAGNOSIS — R52 Pain, unspecified: Secondary | ICD-10-CM | POA: Diagnosis not present

## 2020-09-23 HISTORY — DX: COVID-19: U07.1

## 2020-09-23 LAB — BASIC METABOLIC PANEL
Anion gap: 11 (ref 5–15)
BUN: 9 mg/dL (ref 8–23)
CO2: 26 mmol/L (ref 22–32)
Calcium: 8.8 mg/dL — ABNORMAL LOW (ref 8.9–10.3)
Chloride: 98 mmol/L (ref 98–111)
Creatinine, Ser: 0.71 mg/dL (ref 0.61–1.24)
GFR calc Af Amer: >60 mL/min (ref >60)
GFR calc non Af Amer: >60 mL/min (ref >60)
Glucose, Bld: 110 mg/dL — ABNORMAL HIGH (ref 70–99)
Potassium: 4.1 mmol/L (ref 3.5–5.1)
Sodium: 135 mmol/L (ref 135–145)

## 2020-09-23 LAB — CBC WITH DIFFERENTIAL/PLATELET
Abs Immature Granulocytes: 0.03 10*3/uL (ref 0.00–0.07)
Basophils Absolute: 0 10*3/uL (ref 0.0–0.1)
Basophils Relative: 0 %
Eosinophils Absolute: 0 10*3/uL (ref 0.0–0.5)
Eosinophils Relative: 0 %
HCT: 47.5 % (ref 39.0–52.0)
Hemoglobin: 15 g/dL (ref 13.0–17.0)
Immature Granulocytes: 1 %
Lymphocytes Relative: 22 %
Lymphs Abs: 0.9 10*3/uL (ref 0.7–4.0)
MCH: 29.6 pg (ref 26.0–34.0)
MCHC: 31.6 g/dL (ref 30.0–36.0)
MCV: 93.7 fL (ref 80.0–100.0)
Monocytes Absolute: 0.5 10*3/uL (ref 0.1–1.0)
Monocytes Relative: 11 %
Neutro Abs: 2.9 10*3/uL (ref 1.7–7.7)
Neutrophils Relative %: 66 %
Platelets: 153 10*3/uL (ref 150–400)
RBC: 5.07 MIL/uL (ref 4.22–5.81)
RDW: 13 % (ref 11.5–15.5)
WBC: 4.3 10*3/uL (ref 4.0–10.5)
nRBC: 0 % (ref 0.0–0.2)

## 2020-09-23 LAB — CBG MONITORING, ED: Glucose-Capillary: 102 mg/dL — ABNORMAL HIGH (ref 70–99)

## 2020-09-23 LAB — RESPIRATORY PANEL BY RT PCR (FLU A&B, COVID)
Influenza A by PCR: NEGATIVE
Influenza B by PCR: NEGATIVE
SARS Coronavirus 2 by RT PCR: POSITIVE — AB

## 2020-09-23 MED ORDER — DIPHENHYDRAMINE HCL 50 MG/ML IJ SOLN: 50.0000 mg | Freq: Once | INTRAMUSCULAR | Status: DC | PRN

## 2020-09-23 MED ORDER — ACETAMINOPHEN 325 MG PO TABS
650.0000 mg | ORAL_TABLET | Freq: Once | ORAL | Status: AC
  Administered 2020-09-23: 650 mg via ORAL
  Filled 2020-09-23: qty 2

## 2020-09-23 MED ORDER — SODIUM CHLORIDE 0.9 % IV SOLN
1200.0000 mg | Freq: Once | INTRAVENOUS | Status: AC
  Administered 2020-09-23: 1200 mg via INTRAVENOUS
  Filled 2020-09-23: qty 10

## 2020-09-23 MED ORDER — ALBUTEROL SULFATE HFA 108 (90 BASE) MCG/ACT IN AERS: 2.0000 | INHALATION_SPRAY | Freq: Once | RESPIRATORY_TRACT | Status: DC | PRN

## 2020-09-23 MED ORDER — OXYCODONE HCL 5 MG PO TABS
5.0000 mg | ORAL_TABLET | Freq: Once | ORAL | Status: AC
  Administered 2020-09-23: 5 mg via ORAL
  Filled 2020-09-23: qty 1

## 2020-09-23 MED ORDER — SODIUM CHLORIDE 0.9 % IV SOLN: INTRAVENOUS | Status: DC | PRN

## 2020-09-23 MED ORDER — NAPROXEN 500 MG PO TABS: 500.0000 mg | ORAL_TABLET | Freq: Two times a day (BID) | ORAL | 0 refills | Status: DC | PRN

## 2020-09-23 MED ORDER — FAMOTIDINE IN NACL 20-0.9 MG/50ML-% IV SOLN: 20.0000 mg | Freq: Once | INTRAVENOUS | Status: DC | PRN

## 2020-09-23 MED ORDER — EPINEPHRINE 0.3 MG/0.3ML IJ SOAJ: 0.3000 mg | Freq: Once | INTRAMUSCULAR | Status: DC | PRN

## 2020-09-23 MED ORDER — METHYLPREDNISOLONE SODIUM SUCC 125 MG IJ SOLR: 125.0000 mg | Freq: Once | INTRAMUSCULAR | Status: DC | PRN

## 2020-09-23 MED ORDER — ONDANSETRON HCL 4 MG/2ML IJ SOLN
4.0000 mg | Freq: Once | INTRAMUSCULAR | Status: AC
  Administered 2020-09-23: 4 mg via INTRAVENOUS
  Filled 2020-09-23: qty 2

## 2020-09-23 MED ORDER — ONDANSETRON HCL 4 MG PO TABS: 4.0000 mg | ORAL_TABLET | Freq: Three times a day (TID) | ORAL | 0 refills | Status: DC | PRN

## 2020-09-23 MED ORDER — SODIUM CHLORIDE 0.9 % IV BOLUS
500.0000 mL | Freq: Once | INTRAVENOUS | Status: AC
  Administered 2020-09-23: 500 mL via INTRAVENOUS

## 2020-09-23 NOTE — ED Triage Notes (Addendum)
Pt reports sore throat on Wednesday as well as distorted taste and body aches, also endorses worsening chronic lower back pain. States him and his son got caught in the rain one night this week and both had sore throats but son isfeeling better now. Labs done at pcp on 9/22 in chart

## 2020-09-23 NOTE — ED Provider Notes (Addendum)
[facility] EMERGENCY DEPARTMENT Provider Note   CSN: [id number] Arrival date & time: 09/23/20  9163     History Chief Complaint  Patient presents with  . Back Pain  . Generalized Body Aches  . Sore Throat    William [name] is a 79 y.o. male.  Patient arrives with generalized body aches, scratchy throat, low back pain, fatigue, poor p.o. intake.  Son with similar symptoms.  Has not been vaccinated against Covid.  Symptoms ongoing for the last several days.  Recently saw PCP.  Denies any chest pain, shortness of breath.  Has had a cough.  The history is provided by the patient.  URI Presenting symptoms: cough, fatigue and sore throat   Presenting symptoms: no ear pain and no fever   Severity:  Mild Onset quality:  Gradual Timing:  Constant Progression:  Unchanged Chronicity:  New Relieved by:  Nothing Worsened by:  Nothing Associated symptoms: arthralgias        Past Medical History:  Diagnosis Date  . Bleeding gums 11/11/2015  . Dyslipidemia (high LDL; low HDL)   . Epistaxis 02/08/2017  . Ingrown left greater toenail 08/22/2015  . Low back pain 12/27/2014  . Medial epicondylitis of right elbow 12/13/2014  . Numbness and tingling of right leg   . Tinnitus     Patient Active Problem List   Diagnosis Date Noted  . Gross hematuria 09/21/2020  . Acute pain of left shoulder 09/21/2020  . Numbness 01/27/2020  . Pain 01/27/2020  . Trigger finger of right thumb 01/27/2020  . Tinnitus 03/11/2019  . Dizziness 06/12/2018  . Chronic nonintractable headache 06/12/2018  . Syncope 06/01/2018  . Spider angioma 09/18/2017  . Tremor of right hand 08/23/2017  . Bilateral arm numbness and tingling while sleeping 02/08/2017  . Dyspnea on exertion 02/07/2017  . Allergic rhinitis 02/07/2017  . Right leg numbness 02/07/2017  . Enlarged lymph nodes 08/09/2016  . Health care maintenance 08/09/2016  . Plantar wart of left foot 06/28/2016  . Lateral pain of right  hip 12/20/2015  . Left shoulder pain 11/29/2015  . Finger pain, left 05/23/2015  . Plantar wart of right foot 03/15/2015  . Left wrist pain 12/13/2014  . Superficial bruising 08/04/2014  . Toenail fungus 08/04/2014  . AK (actinic keratosis) 08/04/2014  . Seborrheic dermatitis of scalp 08/04/2014  . Bilateral lower extremity edema 11/16/2013  . Screening for colon cancer 09/28/2013  . Impotence due to erectile dysfunction 08/26/2013  . Radiculopathy with lower extremity symptoms 06/03/2013  . Screening for abdominal aortic aneurysm 07/31/2012  . Asthma-COPD overlap syndrome (Need[place]) 04/10/2012  . KNEE PAIN, LEFT 05/16/2010  . Hyperlipidemia 04/29/2007  . Hyperplasia of prostate 02/27/2007  . Spinal stenosis of lumbar region 02/27/2007    Past Surgical History:  Procedure Laterality Date  . bone chip removed from right heel  1998  . FINGER AMPUTATION     injured by table saw, left 4th and 5th fingers  . KNEE ARTHROSCOPY W/ MENISCAL REPAIR  2008   left  . KNEE ARTHROSCOPY W/ MENISCAL REPAIR  11/2011   left  . LACERATION REPAIR  1962   forehead  . LUMBAR DISC SURGERY  09/2007   bone spurs, stenosis, L3-4, 4-5  . POSTERIOR LUMBAR FUSION 4 LEVEL N/A 10/13/2013   Procedure: Lumbar Two-Three, Lumbar Three-Four, Lumbar Four-Five, Lumbar Five-Sacral One posterior lumbar interbody fusion with interbody prosthesis posterior lateral arthrodesis and posterior segmental instrumentation;  Surgeon: [name], MD;  Location: [facility]  NEURO ORS;  Service: Neurosurgery;  Laterality: N/A;  POSTERIOR LUMBAR FUSION 4 LEVEL  . SKIN GRAFT     from right thigh to left forehead  . SMALL INTESTINE SURGERY  3151   ileitis complications       Family History  Problem Relation Age of Onset  . Stroke Mother   . Heart disease Father   . Stroke Father   . Diabetes Father   . Obesity Brother   . Obesity Brother   . Cancer Maternal Grandfather   . Cancer Maternal Grandmother   . Drug abuse Daughter      Social History   Tobacco Use  . Smoking status: Former Smoker    Packs/day: 1.50    Years: 30.00    Pack years: 45.00    Quit date: 04/02/1986    Years since quitting: 34.5  . Smokeless tobacco: Never Used  . Tobacco comment: no plans to start  Vaping Use  . Vaping Use: Never used  Substance Use Topics  . Alcohol use: Not Currently    Comment: Only very rarely  . Drug use: No    Home Medications Prior to Admission medications   Medication Sig Start Date End Date Taking? Authorizing Provider  b complex vitamins tablet Take 1 tablet by mouth daily.    [provider]  cetirizine (ZYRTEC) 10 MG tablet Take 1 tablet (10 mg total) by mouth daily. 02/07/17   Lupita Dawn, MD  docusate sodium (COLACE) 100 MG capsule Take 100 mg by mouth daily.     [provider]  fluticasone (FLONASE) 50 MCG/ACT nasal spray Place 2 sprays into both nostrils daily. 01/05/20   [name], MD  gabapentin (NEURONTIN) 300 MG capsule TAKE 3 CAPSULES BY MOUTH AT BEDTIME 03/08/20   Patel, [name], DO  Iron-Vitamins (GERITOL) LIQD Take 5 mLs by mouth.    [provider]  Multiple Vitamin (MULTIVITAMIN WITH MINERALS) TABS tablet Take 1 tablet by mouth daily.    [provider]  naproxen (NAPROSYN) 500 MG tablet Take 1 tablet (500 mg total) by mouth 2 (two) times daily as needed. 01/05/20   [name], MD  Omega-3 Fatty Acids (FISH OIL CONCENTRATE PO) Take 1 tablet by mouth.    [provider]  tiZANidine (ZANAFLEX) 4 MG tablet Take 1 tablet (4 mg total) by mouth at bedtime. 01/14/20   Narda Amber K, DO    Allergies    Hydrocodone  Review of Systems   Review of Systems  Constitutional: Positive for fatigue. Negative for chills and fever.  HENT: Positive for sore throat. Negative for ear pain.   Eyes: Negative for pain and visual disturbance.  Respiratory: Positive for cough. Negative for shortness of breath.   Cardiovascular: Negative for  chest pain and palpitations.  Gastrointestinal: Negative for abdominal pain and vomiting.  Genitourinary: Negative for dysuria and hematuria.  Musculoskeletal: Positive for arthralgias. Negative for back pain.  Skin: Negative for color change and rash.  Neurological: Negative for seizures and syncope.  All other systems reviewed and are negative.   Physical Exam Updated Vital Signs  ED Triage Vitals [09/23/20 1005]  Enc Vitals Group     BP 101/76     Pulse Rate 78     Resp 20     Temp 98.3 F (36.8 C)     Temp Source Oral     SpO2 100 %     Weight      Height  Head Circumference      Peak Flow      Pain Score      Pain Loc      Pain Edu?      Excl. in [place]?     Physical Exam Vitals and nursing note reviewed.  Constitutional:      General: He is not in acute distress.    Appearance: He is well-developed. He is not ill-appearing.  HENT:     Head: Normocephalic and atraumatic.     Nose: No congestion.     Mouth/Throat:     Mouth: Mucous membranes are dry.     Tonsils: No tonsillar exudate or tonsillar abscesses.  Eyes:     Conjunctiva/sclera: Conjunctivae normal.  Cardiovascular:     Rate and Rhythm: Normal rate and regular rhythm.     Heart sounds: Normal heart sounds. No murmur heard.   Pulmonary:     Effort: Pulmonary effort is normal. No respiratory distress.     Breath sounds: Normal breath sounds.  Abdominal:     Palpations: Abdomen is soft.     Tenderness: There is no abdominal tenderness.  Musculoskeletal:     Cervical back: Neck supple.  Skin:    General: Skin is warm and dry.     Capillary Refill: Capillary refill takes less than 2 seconds.  Neurological:     General: No focal deficit present.     Mental Status: He is alert.     ED Results / Procedures / Treatments   Labs (all labs ordered are listed, but only abnormal results are displayed) Labs Reviewed  RESPIRATORY PANEL BY RT PCR (FLU A&B, COVID) - Abnormal; Notable for the following  components:      Result Value   SARS Coronavirus 2 by RT PCR POSITIVE (*)    All other components within normal limits  BASIC METABOLIC PANEL - Abnormal; Notable for the following components:   Glucose, Bld 110 (*)    Calcium 8.8 (*)    All other components within normal limits  CBG MONITORING, ED - Abnormal; Notable for the following components:   Glucose-Capillary 102 (*)    All other components within normal limits  CBC WITH DIFFERENTIAL/PLATELET  URINALYSIS, ROUTINE W REFLEX MICROSCOPIC    EKG EKG Interpretation  Date/Time:  Friday September 23 2020 12:03:56 EDT Ventricular Rate:  69 PR Interval:    QRS Duration: 96 QT Interval:  383 QTC Calculation: 411 R Axis:   77 Text Interpretation: Sinus rhythm Left atrial enlargement Confirmed by Lennice Sites [phone]) on 09/23/2020 12:30:50 PM   Radiology DG Chest Portable 1 View  Result Date: 09/23/2020 CLINICAL DATA:  Generalized body aches EXAM: PORTABLE CHEST 1 VIEW COMPARISON:  04/10/2012 FINDINGS: The heart size and mediastinal contours are within normal limits. Atherosclerotic calcification of the aortic knob. Prominent interstitial markings bilaterally with a peripheral and basilar distribution. No lobar consolidation. No pleural effusion or pneumothorax. The visualized skeletal structures are unremarkable. IMPRESSION: Mildly prominent interstitial markings bilaterally with a peripheral and basilar distribution. Findings could reflect an atypical/viral infectious process versus mild edema. Electronically Signed   By: Davina Poke D.O.   On: 09/23/2020 11:35    Procedures Procedures (including critical care time)  Medications Ordered in ED Medications  casirivimab-imdevimab (REGEN-COV) 1,200 mg in sodium chloride 0.9 % 110 mL IVPB (has no administration in time range)  0.9 %  sodium chloride infusion (has no administration in time range)  diphenhydrAMINE (BENADRYL) injection 50 mg (has no administration in  time range)    famotidine (PEPCID) IVPB 20 mg premix (has no administration in time range)  methylPREDNISolone sodium succinate (SOLU-MEDROL) 125 mg/2 mL injection 125 mg (has no administration in time range)  albuterol (VENTOLIN HFA) 108 (90 Base) MCG/ACT inhaler 2 puff (has no administration in time range)  EPINEPHrine (EPI-PEN) injection 0.3 mg (has no administration in time range)  acetaminophen (TYLENOL) tablet 650 mg (650 mg Oral Given 09/23/20 1115)  sodium chloride 0.9 % bolus 500 mL (500 mLs Intravenous New Bag/Given 09/23/20 1130)    ED Course  I have reviewed the triage vital signs and the nursing notes.  Pertinent labs & imaging results that were available during my care of the patient were reviewed by me and considered in my medical decision making (see chart for details).    MDM Rules/Calculators/A&P                          TIGER SPIEKER is a 79 year old male with history of high cholesterol, chronic low back pain who presents to the ED with URI symptoms.  Normal vitals.  No fever.  Patient has Covid type symptoms including sore throat, body aches, arthralgias, possibly change in taste.  Son has similar symptoms.  He is not vaccinated against Covid.  He denies any pain with urination.  No shortness of breath or chest pain.  Has had a dry cough.  We will get basic labs, give fluid bolus, Tylenol.  Will check for Covid.  Will get chest x-ray.  Chest x-ray concerning for Covid pneumonia.  Covid test is positive.  Lab work otherwise unremarkable.  Normal room air oxygenation both at rest and with ambulation.  Will do Mab infusion prior to discharge.  Understands return precautions.  We will have him follow-up in Covid clinic.  Has a pulse oximeter at home and knows to return if oxygen less than 90.  This chart was dictated using voice recognition software.  Despite best efforts to proofread,  errors can occur which can change the documentation meaning.     Final Clinical Impression(s) / ED  Diagnoses Final diagnoses:  [id number]    Rx / DC Orders ED Discharge Orders    None       Lennice Sites, DO 09/23/20 [place], [place], DO 09/23/20 1521

## 2020-09-23 NOTE — Discharge Instructions (Addendum)
Check your pulse oximetry at home as discussed.  If your oxygen levels are consistently below 90 please return also return to the ED if your symptoms worsen including worsening shortness of breath.

## 2020-09-23 NOTE — Telephone Encounter (Signed)
Called patient to check in after ED visit. Discussed return precautions. He is having back spasms, creatinine normal, Rx for naproxen. Recommended, ice, heat, Tylenol, honey for cough. Reviewed strict return precautions over weekend.   Will reschedule CT for when patient is out of window and feeling improved.  Dorris Singh, MD  Family Medicine Teaching Service

## 2020-09-23 NOTE — ED Notes (Signed)
Patient verbalizes understanding of discharge instructions. Opportunity for questioning and answers were provided. Armband removed by staff, pt discharged from ED via wheelchair with wife.

## 2020-09-23 NOTE — Telephone Encounter (Signed)
Wife came to [facility] reports patient is in a lot of pain, poor appetite; vomiting when he eats. Having chills; temperature, blood pressure, oxygen is ok.  Requesting results of lab test. Declined urgent care and ER.  Please call [phone]

## 2020-09-28 ENCOUNTER — Other Ambulatory Visit: Payer: Self-pay | Admitting: Orthopedic Surgery

## 2020-10-03 ENCOUNTER — Encounter: Payer: Self-pay | Admitting: Family Medicine

## 2020-10-03 ENCOUNTER — Other Ambulatory Visit: Payer: Medicare HMO

## 2020-10-03 DIAGNOSIS — Z20822 Contact with and (suspected) exposure to covid-19: Secondary | ICD-10-CM | POA: Diagnosis not present

## 2020-10-18 ENCOUNTER — Ambulatory Visit: Payer: Medicare HMO | Admitting: Family Medicine

## 2020-10-19 ENCOUNTER — Telehealth: Payer: Self-pay | Admitting: Family Medicine

## 2020-10-19 DIAGNOSIS — R319 Hematuria, unspecified: Secondary | ICD-10-CM

## 2020-10-19 DIAGNOSIS — R310 Gross hematuria: Secondary | ICD-10-CM

## 2020-10-19 NOTE — Telephone Encounter (Signed)
Called patient to follow up on his gross hematuria. Seen by Dr. Nori Riis last month, at that time had had gross hematuria of several months duration. Plan was for CT abdomen/pelvis and urology referral, but patient got COVID and thus did not get either done. States he never heard from urology. Since that visit, he says hematuria has resolved and he does not have any issues with it. Patient seemed to want to forego further evaluation of this issue.  Discussed that with hematuria like he had, concern would be for a malignancy of the urinary tract, so I recommend he get the CT scan. If he is unwilling to get the CT, should at least get urinalysis to see if hematuria has resolved microscopically. He is willing to give a urine sample. Lab visit scheduled for tomorrow morning at 8:45am. Ordered UA and urine culture. If UA continues to show microscopic hematuria, he is willing to get CT at that point.  Copying Dr. Nori Riis as an FYI  [name], MD

## 2020-10-20 ENCOUNTER — Other Ambulatory Visit: Payer: Medicare HMO

## 2020-10-24 ENCOUNTER — Other Ambulatory Visit: Payer: Medicare HMO

## 2020-10-24 ENCOUNTER — Other Ambulatory Visit: Payer: Self-pay

## 2020-10-24 DIAGNOSIS — R319 Hematuria, unspecified: Secondary | ICD-10-CM

## 2020-10-26 LAB — URINE CULTURE: Organism ID, Bacteria: NO GROWTH

## 2020-10-28 ENCOUNTER — Telehealth: Payer: Self-pay | Admitting: Family Medicine

## 2020-10-28 NOTE — Telephone Encounter (Signed)
Called patient to discuss that urine culture was negative. Unfortunately urinalysis was not run due to issue in our lab. Stressed to patient that I recommend he get the CT scan given his prolonged hematuria. He declines, says he does not want to do the CT, since his urine is now clear.   Discussed with him that he may have a cancer in his urinary tract, that we could be missing by him not getting the imaging. He accepts that risk and still declines the CT scan. Offered for him to return and give another sample so we can check for hematuria, but he declined to do this too.  He has the capacity to make this decision and understands the risks of refusing. Advised he let us know if anything changes or the symptoms recur.  [name], MD

## 2020-11-30 ENCOUNTER — Other Ambulatory Visit: Payer: Self-pay

## 2020-11-30 ENCOUNTER — Encounter: Payer: Self-pay | Admitting: Family Medicine

## 2020-11-30 ENCOUNTER — Ambulatory Visit (INDEPENDENT_AMBULATORY_CARE_PROVIDER_SITE_OTHER): Payer: Medicare HMO | Admitting: Family Medicine

## 2020-11-30 VITALS — BP 118/70 | HR 75 | Ht 65.0 in | Wt 171.2 lb

## 2020-11-30 DIAGNOSIS — M25512 Pain in left shoulder: Secondary | ICD-10-CM | POA: Diagnosis not present

## 2020-11-30 DIAGNOSIS — R109 Unspecified abdominal pain: Secondary | ICD-10-CM

## 2020-11-30 DIAGNOSIS — Z87448 Personal history of other diseases of urinary system: Secondary | ICD-10-CM

## 2020-11-30 MED ORDER — METHYLPREDNISOLONE ACETATE 40 MG/ML IJ SUSP
40.0000 mg | Freq: Once | INTRAMUSCULAR | Status: AC
  Administered 2020-11-30: 40 mg via INTRAMUSCULAR

## 2020-11-30 NOTE — Patient Instructions (Addendum)
I have given you a shot in the subacromial bursa of your left shoulder today.  I suspect that this will help your shoulder pain but I doubt that it totally gets rid of it.  Our plan, as we negotiated today, is that you will see me back at sports medicine in January.  Please call [phone] to set up an appointment at sports medicine.  I am usually there on most Fridays.  We can look at that left shoulder with the ultrasound there and potentially you will need a second slightly different kind of shoulder injection which we can do that day.  I also want to see you back in sports medicine because we can follow-up the right low back/side pain that you have from wrestling with that [place].  As we discussed, it would really make me happy to let me schedule a CT scan of your abdomen and pelvis.  You tell me that the blood in your urine is not present anymore but the fact that you had it at all especially that you had it for as long as you did, still worries me.  It could be a sign of kidney cancer or bladder cancer.  Both of these can be cured if they are caught early enough.  I will try to convince you I see you back in January to set that up.  Have a great holiday!  If something comes up before then, please call my office and leave me a message and I will get back to you

## 2020-12-01 DIAGNOSIS — R109 Unspecified abdominal pain: Secondary | ICD-10-CM | POA: Insufficient documentation

## 2020-12-01 DIAGNOSIS — Z87448 Personal history of other diseases of urinary system: Secondary | ICD-10-CM | POA: Insufficient documentation

## 2020-12-01 NOTE — Progress Notes (Signed)
CHIEF COMPLAINT / HPI: #1.  Left shoulder pain.  Been bothering him about the last 2 or 3 weeks.  Hurts with certain motions such as overhead reaching.  He has had some problems with the shoulders in the past and this is similar.  No radiation of pain. 2.  Right posterior flank/back pain.  He was helping his son do some yard work and was pulling a bush out of the ground when he felt something tear in his right low back area.  He has gotten a little better in the last 2 or 3 days but it still catches him every once in a while.  He thought he better get it checked out 3.  History of hematuria.  He never got the CT scan that we had set up for him.  He unfortunately got Covid right after I had set that up so he canceled it.  When we contacted him about this, he said he had no longer had any more blood in his urine.  He maintains today that he is not having any blood in his urine, no urinary frequency, no back pain other than that described in #2 above.   PERTINENT  PMH / PSH: I have reviewed the patient's medications, allergies, past medical and surgical history, smoking status and updated in the EMR as appropriate.   OBJECTIVE:  BP 118/70   Pulse 75   Ht 5\' 5"  (1.651 m)   Wt 171 lb 3.2 oz (77.7 kg)   SpO2 96%   BMI 28.49 kg/m  Vital signs reviewed. GENERAL: Well-developed, well-nourished, no acute distress. CARDIOVASCULAR: Regular rate and rhythm no murmur gallop or rub LUNGS: Clear to auscultation bilaterally, no rales or wheeze. ABDOMEN: Soft positive bowel sounds MSK: Movement of extremity x 4.  Normal muscle bulk and tone SHOULDER: Left.  Full range of motion.  Pain with active range of motion in forward flexion and lateral abduction above 90 degrees.  Mild tenderness to palpation over the acromioclavicular joint.  Bicep tendon is nontender. BACK/flank: At the border of his most distal rib right in the fleshy area of the back muscles, there is an area of point tenderness and this  reproduces his pain.  There is no ecchymoses noted here, no unusual warmth or erythema.  There is no true CVA tenderness.  He can reproduce pain here by rotating the trunk to the left and reaching out with his left arm.  He has no pain with deep inspiration.   PROCEDURE: INJECTION: Patient was given informed consent, signed copy in the chart. Appropriate time out was taken. Area prepped and draped in usual sterile fashion. Ethyl chloride was  used for local anesthesia. A 21 gauge 1 1/2 inch needle was used.. 1 cc of methylprednisolone 40 mg/ml plus 4 cc of 1% lidocaine without epinephrine was injected into the left subacromial bursa using a(n) posterior approach.   The patient tolerated the procedure well. There were no complications. Post procedure instructions were given.   ASSESSMENT / PLAN:   Left shoulder pain Looked at some old imaging which shows some acromioclavicular joint arthritis.  I think that is contributing but I also think his main symptom is subacromial bursitis so today we did a corticosteroid injection there.  I doubt this will totally resolve his symptoms so I have made him an appointment to see me at sports medicine in early January.  If his symptoms resolve before then, he can cancel that.  I suspect he will need  a complete ultrasound of the left shoulder, possibly an acromioclavicular joint injection using ultrasound.  Flank pain Today symptoms certainly seems musculoskeletal and it coincided with an inciting injury.  I will have him treat with rest and heating pad as needed but if this gets worse, he will let me know.  It should resolve in the next 1 to 2 weeks totally.  It should not get worse.  History of hematuria I was a little worried about his flank pain being related to his history of hematuria.  He currently has not had hematuria in many weeks.  I still would feel much more comfortable if he had the CT scan that we had recommended.  We discussed this at length.  He  said he will consider it and that we can discuss it again when I see him back in January.   Dorcas Mcmurray MD

## 2020-12-01 NOTE — Assessment & Plan Note (Signed)
Looked at some old imaging which shows some acromioclavicular joint arthritis.  I think that is contributing but I also think his main symptom is subacromial bursitis so today we did a corticosteroid injection there.  I doubt this will totally resolve his symptoms so I have made him an appointment to see me at sports medicine in early January.  If his symptoms resolve before then, he can cancel that.  I suspect he will need a complete ultrasound of the left shoulder, possibly an acromioclavicular joint injection using ultrasound.

## 2020-12-01 NOTE — Assessment & Plan Note (Signed)
I was a little worried about his flank pain being related to his history of hematuria.  He currently has not had hematuria in many weeks.  I still would feel much more comfortable if he had the CT scan that we had recommended.  We discussed this at length.  He said he will consider it and that we can discuss it again when I see him back in January.

## 2020-12-01 NOTE — Assessment & Plan Note (Signed)
Today symptoms certainly seems musculoskeletal and it coincided with an inciting injury.  I will have him treat with rest and heating pad as needed but if this gets worse, he will let me know.  It should resolve in the next 1 to 2 weeks totally.  It should not get worse.

## 2020-12-06 ENCOUNTER — Ambulatory Visit (HOSPITAL_BASED_OUTPATIENT_CLINIC_OR_DEPARTMENT_OTHER): Admit: 2020-12-06 | Payer: Medicare HMO | Admitting: Orthopedic Surgery

## 2020-12-06 ENCOUNTER — Encounter (HOSPITAL_BASED_OUTPATIENT_CLINIC_OR_DEPARTMENT_OTHER): Payer: Self-pay

## 2020-12-06 DIAGNOSIS — H524 Presbyopia: Secondary | ICD-10-CM | POA: Diagnosis not present

## 2020-12-06 DIAGNOSIS — Z0100 Encounter for examination of eyes and vision without abnormal findings: Secondary | ICD-10-CM | POA: Diagnosis not present

## 2020-12-06 SURGERY — CARPAL TUNNEL RELEASE
Anesthesia: Choice | Laterality: Right

## 2020-12-22 ENCOUNTER — Telehealth: Payer: Self-pay | Admitting: Family Medicine

## 2020-12-22 NOTE — Telephone Encounter (Signed)
Patient states he fell yesterday and is needing Dr. Nori Riis to call him, I told him he called sports medicine and he said that Dr. Nori Riis told him to call so I informed him I would put a note in his chart for Dr. Nori Riis to call him back. Thanks

## 2020-12-22 NOTE — Telephone Encounter (Signed)
Dear William [name] Team Please call him back and see what he needs? I am out of town and will be [employer] will be happy to work him in then but if he fell, he may need to be seen sooner. Call me on my cell if you have questions. THANKS! Dorcas Mcmurray

## 2020-12-22 NOTE — Telephone Encounter (Signed)
Spoke to pt. He just wanted to let Dr. Nori Riis know that he fell walking backward while blowing leaves. Tripped over a tree root. He is not in as much pain as yesterday. More sore than pain. He did pee a little blood right after it happened. That has cleared up. I told pt he needs to go to UC or the ED to get checked out. Pt would not entertain the idea and said he just wanted to let Dr. Nori Riis know and will call Monday if he needs to be seen. I told him to call the after hours Dr. If he has any questions or concerns. [name], CMA

## 2021-01-03 ENCOUNTER — Other Ambulatory Visit: Payer: Self-pay | Admitting: Neurology

## 2021-01-13 ENCOUNTER — Other Ambulatory Visit: Payer: Self-pay

## 2021-01-13 ENCOUNTER — Encounter: Payer: Self-pay | Admitting: Neurology

## 2021-01-13 ENCOUNTER — Ambulatory Visit: Payer: Medicare HMO | Admitting: Neurology

## 2021-01-13 ENCOUNTER — Ambulatory Visit: Payer: Medicare HMO | Admitting: Family Medicine

## 2021-01-13 VITALS — BP 112/78 | Ht 67.5 in | Wt 171.0 lb

## 2021-01-13 DIAGNOSIS — R310 Gross hematuria: Secondary | ICD-10-CM

## 2021-01-13 DIAGNOSIS — M25512 Pain in left shoulder: Secondary | ICD-10-CM

## 2021-01-13 DIAGNOSIS — R319 Hematuria, unspecified: Secondary | ICD-10-CM | POA: Diagnosis not present

## 2021-01-13 DIAGNOSIS — R253 Fasciculation: Secondary | ICD-10-CM | POA: Diagnosis not present

## 2021-01-13 DIAGNOSIS — M5441 Lumbago with sciatica, right side: Secondary | ICD-10-CM | POA: Diagnosis not present

## 2021-01-13 MED ORDER — TRAMADOL HCL 50 MG PO TABS: 50.0000 mg | ORAL_TABLET | Freq: Three times a day (TID) | ORAL | 0 refills | Status: DC | PRN

## 2021-01-13 MED ORDER — TIZANIDINE HCL 4 MG PO TABS
4.0000 mg | ORAL_TABLET | Freq: Every day | ORAL | 3 refills | Status: DC
Start: 2021-01-13 — End: 2021-01-27

## 2021-01-13 MED ORDER — GABAPENTIN 300 MG PO CAPS: 900.0000 mg | ORAL_CAPSULE | Freq: Every day | ORAL | 3 refills | Status: DC

## 2021-01-13 MED ORDER — METHYLPREDNISOLONE ACETATE 40 MG/ML IJ SUSP
40.0000 mg | Freq: Once | INTRAMUSCULAR | Status: AC
  Administered 2021-01-13: 40 mg via INTRA_ARTICULAR

## 2021-01-13 NOTE — Assessment & Plan Note (Signed)
Ultrasound performed today shows biceps partial tear with AC joint arthritis and possible subscap fraying. - Repeat injection today, if no improvement consider formal PT vs MRI. MRI if he is willing to have surgery. He has not lost a lot of function so suspect formal PT will be the best course of action.

## 2021-01-13 NOTE — Progress Notes (Signed)
Follow-up Visit   Date: 01/13/21    William [name] MRN: [id number] DOB: 1941/10/12   Interim History: William [name] is a 80 y.o. right-handed Caucasian history of lumbar spinal stenosis s/p decompression and laminectomy and fusion at L2-S1 returning to the clinic for follow-up of muscle cramps, twitching, and leg numbness.  The patient was accompanied to the clinic by self.  History of present illness: Starting around early 2007, he noticed tingling and vibration sensation over the lower legs and numbness of the feet.  He complains of tightness and numbness of the feet.  He has history of lumbar spinal stenosis s/p decompression and laminectomy and fusion at L2-S1 in 2014.  He also complains of cramps in the calves and muscle twitches. This has been present for many years.  He denies any weakness.  NCS confirmed the presence of neuropathy and MRI lumbar spine did not show any new changes, there is post-surgical changes and residual foraminal stenosis at L5-S1, worse on the right. He has adequate relief with gabapentin 900mg  at bedtime and tizanidine 2mg  at bedtime.  Over the past 30-40 years, he has numbness of the right hand when he is biking or using the elliptical machine.  He has bilateral CTS and has elected not to pursue surgery.   UPDATE 01/13/2021:  He is here for follow-up visit.  His leg cramps and twitches are stable and well-controlled on tizanidine 4mg  at bedtime and gabapentin 900mg  at bedtime.  No worsening of leg numbness.  Usually in the morning, he has brief episode of lightheadedness upon getting out of bed, but this is transient.  No new complaints.  He is due for medication refills.  He continues to stay busy working for his [employer] and feels staying active helps his overall well-being.    Medications:  Current Outpatient Medications on File Prior to Visit  Medication Sig Dispense Refill  . b complex vitamins tablet Take 1 tablet by mouth daily.    .  cetirizine (ZYRTEC) 10 MG tablet Take 1 tablet (10 mg total) by mouth daily. 90 tablet 1  . docusate sodium (COLACE) 100 MG capsule Take 100 mg by mouth daily.     . fluticasone (FLONASE) 50 MCG/ACT nasal spray Place 2 sprays into both nostrils daily. 16 g 6  . gabapentin (NEURONTIN) 300 MG capsule TAKE 3 CAPSULES BY MOUTH AT BEDTIME 540 capsule 3  . Iron-Vitamins (GERITOL) LIQD Take 5 mLs by mouth.    . Multiple Vitamin (MULTIVITAMIN WITH MINERALS) TABS tablet Take 1 tablet by mouth daily.    . naproxen (NAPROSYN) 500 MG tablet Take 1 tablet (500 mg total) by mouth 2 (two) times daily as needed. 30 tablet 0  . Omega-3 Fatty Acids (FISH OIL CONCENTRATE PO) Take 1 tablet by mouth.    . ondansetron (ZOFRAN) 4 MG tablet Take 1 tablet (4 mg total) by mouth every 8 (eight) hours as needed for up to 20 doses for nausea or vomiting. 20 tablet 0  . tiZANidine (ZANAFLEX) 4 MG tablet TAKE 1 TABLET(4 MG) BY MOUTH AT BEDTIME 30 tablet 0   No current facility-administered medications on file prior to visit.    Allergies:  Allergies  Allergen Reactions  . Hydrocodone Other (See Comments)    Causes constipation even with stool softner    Review of Systems:  CONSTITUTIONAL: No fevers, chills, night sweats, or weight loss.  EYES: No visual changes or eye pain ENT: No hearing changes.  No history  of nose bleeds.   RESPIRATORY: No cough, wheezing and shortness of breath.   CARDIOVASCULAR: Negative for chest pain, and palpitations.   GI: Negative for abdominal discomfort, blood in stools or black stools.  No recent change in bowel habits.   GU:  No history of incontinence.   MUSCLOSKELETAL: No history of joint pain or swelling.  No myalgias.   SKIN: Negative for lesions, rash, and itching.   ENDOCRINE: Negative for cold or heat intolerance, polydipsia or goiter.   PSYCH:  No depression or anxiety symptoms.   NEURO: As Above.   Vital Signs:  BP (!) 144/81   Pulse 83   Ht 5\' 7"  (1.702 m)   Wt 173  lb (78.5 kg)   SpO2 97%   BMI 27.10 kg/m    Neurological Exam: MENTAL STATUS including orientation to time, place, person, recent and remote memory, attention span and concentration, language, and fund of knowledge is normal.  Speech is not dysarthric.  CRANIAL NERVES: Pupils equal round and reactive to light.  Normal conjugate, extra-ocular eye movements in all directions of gaze.  No ptosis.  Face is symmetric.   MOTOR:  Motor strength is 5/5 in all extremities.  Intermittent right calf fasciculations.  No muscle atrophy.    MSRs:  Right                                                                 Left brachioradialis 2+  brachioradialis 2+  biceps 2+  biceps 2+  triceps 2+  triceps 2+  patellar 1+  patellar 1+  ankle jerk 0  ankle jerk 0   SENSORY: Reduced temperature and vibration distal to ankles.  COORDINATION/GAIT:   Gait narrow based and stable.   Data: NCS/EMG of the legs 12/12/2018: 1. The electrophysiologic findings are most consistent with a sensorimotor polyneuropathy, axon loss in type, affecting the lower extremity. Overall, these findings are mild-to-moderate in degree electrically. 2. There is a superimposed chronic L4 radiculopathy affecting bilateral lower extremities. 3. Specifically, there is no evidence of a widespread disorder of anterior horn cells.  MRI lumbar spine wo contrast 01/01/2018: 1. Postsurgical changes related to prior L2-S1 posterior and interbody fusion, with residual moderate right and mild left neuroforaminal stenosis at L5-S1. Additional residual mild right neuroforaminal stenosis at L4-L5. No spinal canal stenosis at any level.  Labs 12/02/2017:  Ferritin 39.2, TSH 0.66, folate >23, vitamin B12 480, SPEP with IFE no M protein, copper 99  US carotids:  Less than 50% in the right and left ICA MRA head:  Patent vessels MRI brain: Mild generalized atrophy and mild white matter changes, no stroke or mass.  IMPRESSION/PLAN: 1.  Cramps and  fasciculations of the legs due to lumbosacral spondylosis s/p laminectomy at L2-S1 with residual mild UMN findings.  Symptoms are well-controlled on medication and stable.  - Continue gabapentin 900mg  at bedtime  - Continue tizanidine 4mg  daily bedtime  2.  Idiopathic peripheral neuropathy, stable.  He has some mild orthostasis vs sensory ataxia in the morning.  I have asked him to stay well-hydrated and make positional changes slowly.  Return to clinic in 1 year  Thank you for allowing me to participate in patient's care.  If I can answer any additional questions, I would  be pleased to do so.    Sincerely,    Malaia Buchta K. Posey Pronto, DO

## 2021-01-13 NOTE — Assessment & Plan Note (Signed)
Advised relative rest, no bending over, heating pad, tylenol Tramadol also give to take as needed

## 2021-01-13 NOTE — Assessment & Plan Note (Signed)
CT abdomen and pelvis w/o contrast reordered today. Explained to patient we need answers as to why he had gross hematuria and we did let him know it could be a sign he has a life threatening condition such as bladder cancer.

## 2021-01-13 NOTE — Patient Instructions (Signed)
It was great to meet you today! Thank you for letting me participate in your care!  Today, we discussed your left shoulder pain and I am glad you are getting better. Please do the exercises we gave you and if it doesn't continue to get better please let us know.  For your low back pain please continue using Tylenol, no bending over picking up objects below the waist, use over the counter Capsaicin cream, and do the exercises we gave you.  If your low back pain is not resolved in 3 weeks please return to the [facility].  In regards to the blood in the urine please see your Primary Care doctor at the [facility]. We recommend you get the CT scan that was ordered. They will follow up with you after that once you get your CT scan.  Be well, Harolyn Rutherford, DO PGY-4, Sports Medicine Fellow [place]

## 2021-01-13 NOTE — Patient Instructions (Signed)
Return to clinic in 1 year.

## 2021-01-13 NOTE — Progress Notes (Signed)
SUBJECTIVE:   CHIEF COMPLAINT / HPI:   Left Shoulder pain f/u Seen on 12/1 by Dr. Nori Riis and given subacromial injection at that time for suspected subacromial bursitis but also had suspicion for possible partial rotator cuff tear. He states the injection has helped him "a lot" as he can do most of what he wants to do with his left arm. He still endorses some pain and discomfort with abduction of the left arm and has pain with certain movements.  Right sided low back pain Fall on Dec. 22nd while blowing leaves. He fell backward and landed on the right side of his low back. He has had back pain and discomfort in that area since. It is tender, and has noticed it is more stiff, but states overall it is improving. He denies any loss of sensation in the groin region, no pain radiating down his legs, no numbness or tingling. He denies any difficulty balancing, no change in bowel or bladder function.   Hematuria Patient states he fell several weeks ago and developed one day of hematuria which resolved on its own. He has had no episodes since. However, he did have a previous history of hematuria and it was recommended by Dr. Nori Riis he get a non-contrast CT scan of his abdomen and pelvis for further evaluation. Unfortunately, he got COVID and this has not been done. We reinforced with him today the importance of getting this scan.  PERTINENT  PMH / PSH: Hx of hematuria, HLD  OBJECTIVE:   BP 112/78   Ht 5' 7.5" (1.715 m)   Wt 171 lb (77.6 kg)   BMI 26.39 kg/m   No flowsheet data found.  Lumbar spine:  - Inspection: no gross deformity or asymmetry, swelling or ecchymosis. No skin changes. Has well healed midline surgical scar over spine from old surgery - Palpation: No TTP over the spinous processes, TTP over right lumbar paraspinal muscles, no SI joint tenderness b/l - ROM: decreased active ROM of the lumbar spine in flexion and extension without pain - Strength: 5/5 strength of lower extremity  in L4-S1 nerve root distributions b/l - Neuro: gross sensation intact in the L4-S1 nerve root distribution b/l, 1+ L4 and S1 reflexes  Limited MSK U/S: Left Shoulder Complete Ultrasound of Left Shoulder Biceps tendon: well visualized in both transverse and long axis with minimal effusion surrounding the tendon and some disruption of the tendon fibers. Subscapularis: well visualized with no tears, no fiber disruption, but tendon is poorly visualized. Hypoechoic changes present superior to subscap. AC joint: well visualized, giser sign present with osteophyte formation and spurring seen Infraspinatus: well visualized with no disruption of the fibers, no hypoechoic changes Supraspinatus: well visualized with no tears or fiber disruption no hypoechoic changes Posterior glenohumeral joint: well visualized Interpretation: Overall, biceps partial tear with effusion and AC joint arthritis with possible subscap tear    ASSESSMENT/PLAN:   Acute pain of left shoulder Ultrasound performed today shows biceps partial tear with AC joint arthritis and possible subscap fraying. - Repeat injection today, if no improvement consider formal PT vs MRI. MRI if he is willing to have surgery. He has not lost a lot of function so suspect formal PT will be the best course of action.  Gross hematuria CT abdomen and pelvis w/o contrast reordered today. Explained to patient we need answers as to why he had gross hematuria and we did let him know it could be a sign he has a life threatening condition such  as bladder cancer.  Acute low back pain Advised relative rest, no bending over, heating pad, tylenol Tramadol also give to take as needed Follow up for this as needed.   Written and verbal consent was obtained after discussing the risks and benefits of the procedure with the patient. A timeout was performed and the correct site and side was identified and confirmed.  The left shoulder was cleaned in sterile fashion  with alcohol pad. 1cc of 40 mg Depo-medrol and 4 cc 1% Lidocaine was injected using a posterolateral directing the needle medially using a 5 cc syringe and 25 gauge 1 and 1/2 in needle. No complications were encountered. Minimal blood loss. A band aid was applied. Injection performed by Dr. Nori Riis.   Nuala Alpha, DO PGY-4, Sports Medicine Fellow [place]

## 2021-01-20 ENCOUNTER — Other Ambulatory Visit: Payer: Self-pay

## 2021-01-20 ENCOUNTER — Ambulatory Visit
Admission: RE | Admit: 2021-01-20 | Discharge: 2021-01-20 | Disposition: A | Discharge status: Home / Self Care | Payer: Medicare HMO | Source: Ambulatory Visit | Attending: Family Medicine | Admitting: Family Medicine

## 2021-01-20 DIAGNOSIS — R319 Hematuria, unspecified: Secondary | ICD-10-CM

## 2021-01-20 DIAGNOSIS — R109 Unspecified abdominal pain: Secondary | ICD-10-CM | POA: Diagnosis not present

## 2021-01-20 DIAGNOSIS — R310 Gross hematuria: Secondary | ICD-10-CM

## 2021-01-23 ENCOUNTER — Telehealth: Payer: Self-pay | Admitting: Family Medicine

## 2021-01-23 ENCOUNTER — Telehealth: Payer: Self-pay | Admitting: *Deleted

## 2021-01-23 DIAGNOSIS — R310 Gross hematuria: Secondary | ICD-10-CM

## 2021-01-23 DIAGNOSIS — N3289 Other specified disorders of bladder: Secondary | ICD-10-CM

## 2021-01-23 NOTE — Telephone Encounter (Signed)
See my note from earlier today.

## 2021-01-23 NOTE — Telephone Encounter (Signed)
Reviewed CT scan findings with him via [facility]ne. Concern for bladder neoplasm. He agrees (relucttantly) to see urology for consideration cystoscopy. Continues to have MSk pain in shoulder and right flank area. I will also schedule him to see me this week for f/u of MSK issues. Shoulder inj did not help much.

## 2021-01-26 ENCOUNTER — Other Ambulatory Visit: Payer: Self-pay

## 2021-01-26 ENCOUNTER — Ambulatory Visit: Payer: Medicare HMO | Admitting: Podiatry

## 2021-01-26 ENCOUNTER — Telehealth: Payer: Self-pay | Admitting: Family Medicine

## 2021-01-26 DIAGNOSIS — B351 Tinea unguium: Secondary | ICD-10-CM | POA: Diagnosis not present

## 2021-01-26 DIAGNOSIS — R310 Gross hematuria: Secondary | ICD-10-CM | POA: Diagnosis not present

## 2021-01-26 DIAGNOSIS — M79609 Pain in unspecified limb: Secondary | ICD-10-CM | POA: Diagnosis not present

## 2021-01-26 DIAGNOSIS — Z9189 Other specified personal risk factors, not elsewhere classified: Secondary | ICD-10-CM

## 2021-01-26 MED ORDER — TERBINAFINE HCL 250 MG PO TABS: 250.0000 mg | ORAL_TABLET | Freq: Every day | ORAL | 0 refills | Status: AC

## 2021-01-26 NOTE — Patient Instructions (Addendum)
Have your liver test checked in 6 weeks (mid-March) Fungal Nail Infection A fungal nail infection is a common infection of the toenails or fingernails. This condition affects toenails more often than fingernails. It often affects the great, or big, toes. More than one nail may be infected. The condition can be passed from person to person (is contagious). What are the causes? This condition is caused by a fungus. Several types of fungi can cause the infection. These fungi are common in moist and warm areas. If your hands or feet come into contact with the fungus, it may get into a crack in your fingernail or toenail and cause the infection. What increases the risk? The following factors may make you more likely to develop this condition:  Being male.  Being of older age.  Living with someone who has the fungus.  Walking barefoot in areas where the fungus thrives, such as showers or locker rooms.  Wearing shoes and socks that cause your feet to sweat.  Having a nail injury or a recent nail surgery.  Having certain medical conditions, such as: ? Athlete's foot. ? Diabetes. ? Psoriasis. ? Poor circulation. ? A weak body defense system (immune system). What are the signs or symptoms? Symptoms of this condition include:  A pale spot on the nail.  Thickening of the nail.  A nail that becomes yellow or brown.  A brittle or ragged nail edge.  A crumbling nail.  A nail that has lifted away from the nail bed.   How is this diagnosed? This condition is diagnosed with a physical exam. Your health care provider may take a scraping or clipping from your nail to test for the fungus. How is this treated? Treatment is not needed for mild infections. If you have significant nail changes, treatment may include:  Antifungal medicines taken by mouth (orally). You may need to take the medicine for several weeks or several months, and you may not see the results for a long time. These medicines  can cause side effects. Ask your health care provider what problems to watch for.  Antifungal nail polish or nail cream. These may be used along with oral antifungal medicines.  Laser treatment of the nail.  Surgery to remove the nail. This may be needed for the most severe infections. It can take a long time, usually up to a year, for the infection to go away. The infection may also come back.   Follow these instructions at home: Medicines  Take or apply over-the-counter and prescription medicines only as told by your health care provider.  Ask your health care provider about using over-the-counter mentholated ointment on your nails. Nail care  Trim your nails often.  Wash and dry your hands and feet every day.  Keep your feet dry: ? Wear absorbent socks, and change your socks frequently. ? Wear shoes that allow air to circulate, such as sandals or canvas tennis shoes. Throw out old shoes.  Do not use artificial nails.  If you go to a nail salon, make sure you choose one that uses clean instruments.  Use antifungal foot powder on your feet and in your shoes. General instructions  Do not share personal items, such as towels or nail clippers.  Do not walk barefoot in shower rooms or locker rooms.  Wear rubber gloves if you are working with your hands in wet areas.  Keep all follow-up visits as told by your health care provider. This is important. Contact a health care  provider if: Your infection is not getting better or it is getting worse after several months. Summary  A fungal nail infection is a common infection of the toenails or fingernails.  Treatment is not needed for mild infections. If you have significant nail changes, treatment may include taking medicine orally and applying medicine to your nails.  It can take a long time, usually up to a year, for the infection to go away. The infection may also come back.  Take or apply over-the-counter and prescription  medicines only as told by your health care provider.  Follow instructions for taking care of your nails to help prevent infection from coming back or spreading. This information is not intended to replace advice given to you by your health care provider. Make sure you discuss any questions you have with your health care provider. Document Revised: 04/09/2019 Document Reviewed: 05/23/2018 Elsevier Patient Education  2021 [employer].

## 2021-01-26 NOTE — Telephone Encounter (Signed)
Patient would like for Dr. Ardelia Mems to place a referral for him to see [facility]. He went there today and was told to ask for a referral to be faxed to [phone] before they would schedule him.

## 2021-01-26 NOTE — Progress Notes (Signed)
    Subjective:  Patient ID: William [name], male    DOB: 1941-08-26,  MRN: [id number]  Chief Complaint  Patient presents with  . Nail Problem    Bilateral hallux nail is thickened and discolored     80 y.o. male presents with the above complaint. History confirmed with patient.   Objective:  Physical Exam: warm, good capillary refill, no trophic changes or ulcerative lesions, normal DP and PT pulses and normal sensory exam. B/L hallux yellowed and thickend  Assessment:   1. Pain due to onychomycosis of nail      Plan:  Patient was evaluated and treated and all questions answered.  Discussed the etiology and treatment options for the condition in detail with the patient. Educated patient on the topical and oral treatment options for mycotic nails. We will treat with lamisil x90 day course. He will have his LFTs checked at 6 weeks into therapy  Return in about 4 months (around 05/26/2021) for check nail fungus after Lamisil therapy.

## 2021-01-27 ENCOUNTER — Other Ambulatory Visit: Payer: Self-pay | Admitting: Neurology

## 2021-01-27 ENCOUNTER — Ambulatory Visit (INDEPENDENT_AMBULATORY_CARE_PROVIDER_SITE_OTHER): Payer: Medicare HMO | Admitting: Family Medicine

## 2021-01-27 ENCOUNTER — Telehealth: Payer: Self-pay | Admitting: Neurology

## 2021-01-27 DIAGNOSIS — M62830 Muscle spasm of back: Secondary | ICD-10-CM

## 2021-01-27 DIAGNOSIS — N3289 Other specified disorders of bladder: Secondary | ICD-10-CM | POA: Diagnosis not present

## 2021-01-27 DIAGNOSIS — M25512 Pain in left shoulder: Secondary | ICD-10-CM

## 2021-01-27 DIAGNOSIS — M5441 Lumbago with sciatica, right side: Secondary | ICD-10-CM | POA: Diagnosis not present

## 2021-01-27 DIAGNOSIS — R310 Gross hematuria: Secondary | ICD-10-CM

## 2021-01-27 DIAGNOSIS — M545 Low back pain, unspecified: Secondary | ICD-10-CM | POA: Diagnosis not present

## 2021-01-27 MED ORDER — METHYLPREDNISOLONE ACETATE 40 MG/ML IJ SUSP
40.0000 mg | Freq: Once | INTRAMUSCULAR | Status: AC
  Administered 2021-01-27: 40 mg via INTRA_ARTICULAR

## 2021-01-27 NOTE — Assessment & Plan Note (Signed)
Likely related to muscle spasm and trigger points, with 2 exquisitely tender points on exam today.  Provided trigger point CSI in these areas to assess for improvement.  Continue to avoid painful activities.  Heat/Voltaren gel.

## 2021-01-27 NOTE — Addendum Note (Signed)
Addended by: [name] on: 01/27/2021 10:41 AM   Modules accepted: Orders

## 2021-01-27 NOTE — Assessment & Plan Note (Signed)
Urology did preliminary cystoscope showing isolated bladder mass.  They plan to perform excision in the next few weeks.  He is very relieved as the procedure was not nearly as bad as he thought.  This certainly explains his chronic hematuria.  Really glad he finally convinced himself to go to the urologist.

## 2021-01-27 NOTE — Assessment & Plan Note (Signed)
Chronic, with minimal improvement since last visit after subacromial CSI x2.  Felt to be multifactorial to subacromial bursitis, AC arthritis, and recent U/S showing possible subacute/chronic partial tear of the subscapularis.  Reassuringly he has minimal impact in his function, however continues to mainly bother him at night.  Discussed options including referral to orthopedics with MRI vs exercises/PT, he decided to opt for exercises first.  Provided home exercises today.  Continue ice/heat and may try Voltaren gel QID.

## 2021-01-27 NOTE — Progress Notes (Addendum)
[facility] Attending Note: I have seen and examined this patient. I have discussed this patient with the resident and reviewed the assessment and plan as documented above. I agree with the resident's findings and plan. #1.  Left shoulder pain: The second injection did not seem to help much.  We discussed options including surgical evaluation, further imaging, physical therapy versus home exercise program.  He chose home exercise program; will follow-up 1 month.  Exercises and exercise band given.  Exercises demonstrated and explained. 2.  Back spasm: He does have several trigger point areas in the right low back. Medications did not help much. Has been taking Tylenol which helps some.I feel much better about treating this as strictly musculoskeletal now that we have CT scan that shows nothing related to his kidney.  We will try trigger point injection today.  Also gave him some low back exercises and stretches.  PROCEDURE: INJECTION: Patient was given informed consent, signed copy in the chart. Appropriate time out was taken. Area prepped and draped in usual sterile fashion. Ethyl chloride was  used for local anesthesia. A 21 gauge 1 1/2 inch needle was used.. 1 cc of methylprednisolone 40 mg/ml plus 3 cc of 1% lidocaine without epinephrine was injected into the trigger point area on the right low back using a(n) perpendicular approach.   The patient tolerated the procedure well. There were no complications. Post procedure instructions were given.   #3.  Hematuria with abnormal CT scan.  He did see the urologist and they did a quick cystoscope and saw that he has 1 lesion in his bladder.  The rest of the bladder appears to be normal.  Urology  plans to do cystoscopy with removal of the abnormal area in the next few weeks.  He is very relieved that the cystoscope was not nearly as bad as he thought.

## 2021-01-27 NOTE — Patient Instructions (Signed)
It was great to see you today!  -Start doing the exercises you were shown today. -Keep using heat for 20 minutes at a time several times a day on your back or shoulder. -You can try Voltaren gel up to 4 times daily as well.

## 2021-01-27 NOTE — Telephone Encounter (Signed)
Patient needs refills on the muscle relaxer ( he could not tell me the name ) and the gabapentin    He uses the [facility] on [address]

## 2021-01-27 NOTE — Telephone Encounter (Signed)
Referral entered Laurence Crofford J Fatma Rutten, MD  

## 2021-01-27 NOTE — Progress Notes (Signed)
SUBJECTIVE:   CHIEF COMPLAINT / HPI: Follow-up shoulder/flank pain  Mr. Kulpa is a 80 year old gentleman presenting for follow-up of the following:  Left shoulder pain: Recently seen for this concern on 1/14 with U/S at that time showing biceps partial tear with [facility] joint arthritis and possible subscapularis fraying.  Previously had success with subacromial injection, reattempted injection during this visit as well.  Today he reports the injection did not provide much help this time.  It continues to hurt with abduction and over 90 degrees and aches in the back when he is trying to go to sleep.  Denies any popping, weakness, numbness/tingling.  Low back/flank pain: Unchanged for the past few months.  Present on his right lower back.  Recently went to urology with CT abdomen showing 2 cm mass in his left/posterior bladder wall, does not feel to be contributing to this pain.  Hurts mainly when he does certain movements, not sure which ones provoke it.  Recently has tried heat which does help.  Denies any numbness/tingling, lower extremity weakness, bowel/bladder changes.   He did see the urologist, Dr. Tammi Klippel, yesterday who performed cystoscopy to see the mass.  They will proceed with resection of this tumor in the next 6 weeks, has not been scheduled yet.  PERTINENT  PMH / PSH: Spinal stenosis of lumbar region, asthma/COPD overlap syndrome, recently noted 2 cm region along his posterior bladder wall suspicious for neoplasm on CT abdomen/pelvis on 01/20/2021  OBJECTIVE:   BP 127/62   Ht 5' 7.75" (1.721 m)   Wt 159 lb (72.1 kg)   BMI 24.35 kg/m   Gen: NAD, pleasant older gentleman  Left Shoulder: Inspection reveals no obvious deformity, atrophy, or asymmetry. No bruising. No swelling. Palpation is normal with no TTP over [facility] joint or bicipital groove. Full ROM in flexion, abduction, internal/external rotation (pain with abduction over 90 degrees and extension) NV intact distally Special  Tests:  - Impingement: Neg Hawkins, neers, empty can sign. - Supraspinatous: Negative empty can.  - Infraspinatous/Teres Minor: 5/5 strength with ER - Subscapularis: 5/5 strength with IR - AC Joint: Negative cross arm - No drop arm sign  Lumbar spine: - Inspection: no gross deformity or asymmetry, swelling or ecchymosis, vertical surgical scar present in midline lumbar spine  - Palpation: No TTP over the spinous processes, TTP over mid belly of quadratus muscle on the right and superior/lateral to right SI joint on muscular portion - ROM: decreased active ROM of the lumbar spine in flexion and extension without pain - Strength: 5/5 strength of lower extremity in L4-S1 nerve root distributions b/l; normal gait - Neuro: sensation intact in the L4-S1  - Special testing: Negative FABER  ASSESSMENT/PLAN:   Left shoulder pain Chronic, with minimal improvement since last visit after subacromial CSI x2.  Felt to be multifactorial to subacromial bursitis, AC arthritis, and recent U/S showing possible subacute/chronic partial tear of the subscapularis.  Reassuringly he has minimal impact in his function, however continues to mainly bother him at night.  Discussed options including referral to orthopedics with MRI vs exercises/PT, he decided to opt for exercises first.  Provided home exercises today.  Continue ice/heat and may try Voltaren gel QID.   Acute low back pain Likely related to muscle spasm and trigger points, with 2 exquisitely tender points on exam today.  Provided trigger point CSI in these areas to assess for improvement.  Continue to avoid painful activities.  Heat/Voltaren gel.    Follow-up in approximately  1 month for the above concerns or sooner if needed.  Patriciaann Clan, [place]

## 2021-01-29 ENCOUNTER — Encounter: Payer: Self-pay | Admitting: Podiatry

## 2021-01-30 ENCOUNTER — Other Ambulatory Visit: Payer: Self-pay | Admitting: Urology

## 2021-01-30 ENCOUNTER — Other Ambulatory Visit: Payer: Self-pay

## 2021-01-30 NOTE — Telephone Encounter (Signed)
Called and spoke to the pharmacist at [facility]. Was informed that patients gabapentin and tizanidine was received. Pharmacist informed me that patients Tizanidine will be ready today in a few hours and Gabapentin cannot be filled until 02/11/21 per Insurance.   Called patient at 11:35am and informed him of above per pharmacist. Patient had no questions or concerns.

## 2021-01-30 NOTE — Telephone Encounter (Signed)
I had sent refills on 01/13/2021 (see med history), but it seem like his sports medicine provider may be discontinued it.  Can you confirm with his pharmacy to see if they still have a prescription for gabapentin 300mg  - take 3 tablets at bedtime and tizanidine 4mg  - take 1 tablet at bedtime.   If not, OK to send a new Rx with 90-day supply, 3 refills for both.

## 2021-02-04 ENCOUNTER — Other Ambulatory Visit (HOSPITAL_COMMUNITY)
Admission: RE | Admit: 2021-02-04 | Discharge: 2021-02-04 | Disposition: A | Discharge status: Home / Self Care | Payer: Medicare HMO | Source: Ambulatory Visit | Attending: Urology | Admitting: Urology

## 2021-02-04 DIAGNOSIS — Z20822 Contact with and (suspected) exposure to covid-19: Secondary | ICD-10-CM | POA: Diagnosis not present

## 2021-02-04 DIAGNOSIS — Z01812 Encounter for preprocedural laboratory examination: Secondary | ICD-10-CM | POA: Diagnosis not present

## 2021-02-04 LAB — SARS CORONAVIRUS 2 (TAT 6-24 HRS): SARS Coronavirus 2: NEGATIVE

## 2021-02-06 ENCOUNTER — Encounter (HOSPITAL_BASED_OUTPATIENT_CLINIC_OR_DEPARTMENT_OTHER): Payer: Self-pay | Admitting: Urology

## 2021-02-06 ENCOUNTER — Other Ambulatory Visit: Payer: Self-pay

## 2021-02-06 NOTE — Progress Notes (Addendum)
Spoke w/ via phone for pre-op interview---pt Lab needs dos----  I stat (gent)             Lab results------ekg 09-23-2020 chest xray 09-23-2020 epic (pt states no sob or trouble with chores walking up stairs et since 09-23-2020 covid) echo 02-14-2017 epic COVID test ------02-04-2021 negative results in epic Arrive at -------1245 am 02-08-2021 NPO after MN NO Solid Food.  Clear liquids from MN until---1145 am then npo Medications to take morning of surgery -----flonase  Diabetic medication ----n/a- Patient Special Instructions -----none Pre-Op special Istructions -----none Patient verbalized understanding of instructions that were given at this phone interview. Patient denies shortness of breath, chest pain, fever, cough at this phone interview.

## 2021-02-08 ENCOUNTER — Ambulatory Visit (HOSPITAL_BASED_OUTPATIENT_CLINIC_OR_DEPARTMENT_OTHER)
Admission: RE | Admit: 2021-02-08 | Discharge: 2021-02-08 | Disposition: A | Discharge status: Home / Self Care | Payer: Medicare HMO | Attending: Urology | Admitting: Urology

## 2021-02-08 ENCOUNTER — Encounter (HOSPITAL_BASED_OUTPATIENT_CLINIC_OR_DEPARTMENT_OTHER)
Admission: RE | Disposition: A | Discharge status: Home / Self Care | Payer: Self-pay | Source: Home / Self Care | Attending: Urology

## 2021-02-08 ENCOUNTER — Encounter (HOSPITAL_BASED_OUTPATIENT_CLINIC_OR_DEPARTMENT_OTHER): Payer: Self-pay | Admitting: Urology

## 2021-02-08 ENCOUNTER — Other Ambulatory Visit: Payer: Self-pay

## 2021-02-08 ENCOUNTER — Ambulatory Visit (HOSPITAL_BASED_OUTPATIENT_CLINIC_OR_DEPARTMENT_OTHER): Payer: Medicare HMO | Admitting: Certified Registered Nurse Anesthetist

## 2021-02-08 DIAGNOSIS — E785 Hyperlipidemia, unspecified: Secondary | ICD-10-CM | POA: Diagnosis not present

## 2021-02-08 DIAGNOSIS — Z87891 Personal history of nicotine dependence: Secondary | ICD-10-CM | POA: Insufficient documentation

## 2021-02-08 DIAGNOSIS — Z8616 Personal history of COVID-19: Secondary | ICD-10-CM | POA: Diagnosis not present

## 2021-02-08 DIAGNOSIS — Z885 Allergy status to narcotic agent status: Secondary | ICD-10-CM | POA: Insufficient documentation

## 2021-02-08 DIAGNOSIS — M48061 Spinal stenosis, lumbar region without neurogenic claudication: Secondary | ICD-10-CM | POA: Diagnosis not present

## 2021-02-08 DIAGNOSIS — C679 Malignant neoplasm of bladder, unspecified: Secondary | ICD-10-CM | POA: Diagnosis not present

## 2021-02-08 DIAGNOSIS — D090 Carcinoma in situ of bladder: Secondary | ICD-10-CM | POA: Diagnosis not present

## 2021-02-08 DIAGNOSIS — C678 Malignant neoplasm of overlapping sites of bladder: Secondary | ICD-10-CM | POA: Diagnosis not present

## 2021-02-08 DIAGNOSIS — J449 Chronic obstructive pulmonary disease, unspecified: Secondary | ICD-10-CM | POA: Diagnosis not present

## 2021-02-08 HISTORY — DX: Tinea unguium: B35.1

## 2021-02-08 HISTORY — DX: Malignant neoplasm of bladder, unspecified: C67.9

## 2021-02-08 HISTORY — PX: TRANSURETHRAL RESECTION OF BLADDER TUMOR: SHX2575

## 2021-02-08 HISTORY — PX: CYSTOSCOPY W/ RETROGRADES: SHX1426

## 2021-02-08 HISTORY — DX: Presence of spectacles and contact lenses: Z97.3

## 2021-02-08 LAB — POCT I-STAT, CHEM 8
BUN: 8 mg/dL (ref 8–23)
Calcium, Ion: 1.31 mmol/L (ref 1.15–1.40)
Chloride: 104 mmol/L (ref 98–111)
Creatinine, Ser: 0.7 mg/dL (ref 0.61–1.24)
Glucose, Bld: 91 mg/dL (ref 70–99)
HCT: 46 % (ref 39.0–52.0)
Hemoglobin: 15.6 g/dL (ref 13.0–17.0)
Potassium: 3.9 mmol/L (ref 3.5–5.1)
Sodium: 141 mmol/L (ref 135–145)
TCO2: 25 mmol/L (ref 22–32)

## 2021-02-08 SURGERY — TURBT (TRANSURETHRAL RESECTION OF BLADDER TUMOR)
Anesthesia: General | Site: Renal

## 2021-02-08 MED ORDER — FENTANYL CITRATE (PF) 100 MCG/2ML IJ SOLN: 25.0000 ug | INTRAMUSCULAR | Status: DC | PRN

## 2021-02-08 MED ORDER — GENTAMICIN SULFATE 40 MG/ML IJ SOLN
5.0000 mg/kg | INTRAVENOUS | Status: AC
  Administered 2021-02-08: 360 mg via INTRAVENOUS
  Filled 2021-02-08: qty 9

## 2021-02-08 MED ORDER — OXYCODONE HCL 5 MG PO TABS: 5.0000 mg | ORAL_TABLET | Freq: Once | ORAL | Status: DC | PRN

## 2021-02-08 MED ORDER — SENNOSIDES-DOCUSATE SODIUM 8.6-50 MG PO TABS: 1.0000 | ORAL_TABLET | Freq: Two times a day (BID) | ORAL | 0 refills | Status: DC

## 2021-02-08 MED ORDER — SODIUM CHLORIDE 0.9 % IR SOLN
Status: DC | PRN
  Administered 2021-02-08: 6000 mL

## 2021-02-08 MED ORDER — DEXAMETHASONE SODIUM PHOSPHATE 10 MG/ML IJ SOLN
INTRAMUSCULAR | Status: DC | PRN
  Administered 2021-02-08: 10 mg via INTRAVENOUS

## 2021-02-08 MED ORDER — EPHEDRINE SULFATE-NACL 50-0.9 MG/10ML-% IV SOSY
PREFILLED_SYRINGE | INTRAVENOUS | Status: DC | PRN
  Administered 2021-02-08: 10 mg via INTRAVENOUS

## 2021-02-08 MED ORDER — AMISULPRIDE (ANTIEMETIC) 5 MG/2ML IV SOLN: 10.0000 mg | Freq: Once | INTRAVENOUS | Status: DC | PRN

## 2021-02-08 MED ORDER — ONDANSETRON HCL 4 MG/2ML IJ SOLN: 4.0000 mg | Freq: Once | INTRAMUSCULAR | Status: DC | PRN

## 2021-02-08 MED ORDER — EPHEDRINE 5 MG/ML INJ
INTRAVENOUS | Status: AC
  Filled 2021-02-08: qty 10

## 2021-02-08 MED ORDER — PROPOFOL 10 MG/ML IV BOLUS
INTRAVENOUS | Status: DC | PRN
  Administered 2021-02-08: 150 mg via INTRAVENOUS

## 2021-02-08 MED ORDER — LACTATED RINGERS IV SOLN: INTRAVENOUS | Status: DC

## 2021-02-08 MED ORDER — FENTANYL CITRATE (PF) 100 MCG/2ML IJ SOLN
INTRAMUSCULAR | Status: DC | PRN
  Administered 2021-02-08 (×2): 50 ug via INTRAVENOUS

## 2021-02-08 MED ORDER — OXYCODONE HCL 5 MG/5ML PO SOLN: 5.0000 mg | Freq: Once | ORAL | Status: DC | PRN

## 2021-02-08 MED ORDER — TRAMADOL HCL 50 MG PO TABS: 50.0000 mg | ORAL_TABLET | Freq: Four times a day (QID) | ORAL | 0 refills | Status: DC | PRN

## 2021-02-08 MED ORDER — ONDANSETRON HCL 4 MG/2ML IJ SOLN
INTRAMUSCULAR | Status: DC | PRN
  Administered 2021-02-08: 4 mg via INTRAVENOUS

## 2021-02-08 MED ORDER — IOHEXOL 300 MG/ML  SOLN
INTRAMUSCULAR | Status: DC | PRN
  Administered 2021-02-08: 12 mL

## 2021-02-08 MED ORDER — FENTANYL CITRATE (PF) 100 MCG/2ML IJ SOLN
INTRAMUSCULAR | Status: AC
  Filled 2021-02-08: qty 2

## 2021-02-08 MED ORDER — TAMSULOSIN HCL 0.4 MG PO CAPS: 0.4000 mg | ORAL_CAPSULE | Freq: Every day | ORAL | 1 refills | Status: DC | PRN

## 2021-02-08 MED ORDER — LIDOCAINE 2% (20 MG/ML) 5 ML SYRINGE
INTRAMUSCULAR | Status: DC | PRN
  Administered 2021-02-08: 60 mg via INTRAVENOUS

## 2021-02-08 SURGICAL SUPPLY — 32 items
BAG DRAIN URO-CYSTO SKYTR STRL (DRAIN) ×3 IMPLANT
BAG DRN RND TRDRP ANRFLXCHMBR (UROLOGICAL SUPPLIES)
BAG DRN UROCATH (DRAIN) ×2
BAG URINE DRAIN 2000ML AR STRL (UROLOGICAL SUPPLIES) IMPLANT
BAG URINE LEG 500ML (DRAIN) IMPLANT
CATH FOLEY 2WAY SLVR  5CC 22FR (CATHETERS)
CATH FOLEY 2WAY SLVR 30CC 20FR (CATHETERS) IMPLANT
CATH FOLEY 2WAY SLVR 5CC 22FR (CATHETERS) IMPLANT
CATH INTERMIT  6FR 70CM (CATHETERS) ×3 IMPLANT
CLOTH BEACON ORANGE TIMEOUT ST (SAFETY) ×3 IMPLANT
ELECT REM PT RETURN 9FT ADLT (ELECTROSURGICAL)
ELECTRODE REM PT RTRN 9FT ADLT (ELECTROSURGICAL) ×2 IMPLANT
EVACUATOR MICROVAS BLADDER (UROLOGICAL SUPPLIES) IMPLANT
GLOVE BIOGEL M 6.5 STRL (GLOVE) ×1 IMPLANT
GLOVE SURG ENC MOIS LTX SZ7.5 (GLOVE) ×3 IMPLANT
GLOVE SURG SS PI 7.5 STRL IVOR (GLOVE) ×1 IMPLANT
GOWN STRL REUS W/ TWL LRG LVL3 (GOWN DISPOSABLE) IMPLANT
GOWN STRL REUS W/TWL LRG LVL3 (GOWN DISPOSABLE) ×7 IMPLANT
GUIDEWIRE ANG ZIPWIRE 038X150 (WIRE) IMPLANT
GUIDEWIRE STR DUAL SENSOR (WIRE) IMPLANT
IV NS 1000ML (IV SOLUTION)
IV NS 1000ML BAXH (IV SOLUTION) ×2 IMPLANT
IV NS IRRIG 3000ML ARTHROMATIC (IV SOLUTION) ×4 IMPLANT
KIT TURNOVER CYSTO (KITS) ×3 IMPLANT
LOOP CUT BIPOLAR 24F LRG (ELECTROSURGICAL) ×1 IMPLANT
MANIFOLD NEPTUNE II (INSTRUMENTS) ×3 IMPLANT
NS IRRIG 500ML POUR BTL (IV SOLUTION) ×3 IMPLANT
PACK CYSTO (CUSTOM PROCEDURE TRAY) ×3 IMPLANT
SYR 10ML LL (SYRINGE) IMPLANT
SYR TOOMEY IRRIG 70ML (MISCELLANEOUS) ×3
SYRINGE TOOMEY IRRIG 70ML (MISCELLANEOUS) IMPLANT
TUBE CONNECTING 12X1/4 (SUCTIONS) ×1 IMPLANT

## 2021-02-08 NOTE — Transfer of Care (Signed)
Immediate Anesthesia Transfer of Care Note  Patient: William [name]  Procedure(s) Performed: TRANSURETHRAL RESECTION OF BLADDER TUMOR (TURBT) (N/A Bladder) CYSTOSCOPY WITH RETROGRADE PYELOGRAM (Bilateral Renal)  Patient Location: PACU  Anesthesia Type:General  Level of Consciousness: awake, alert , oriented and patient cooperative  Airway & Oxygen Therapy: Patient Spontanous Breathing  Post-op Assessment: Report given to RN and Post -op Vital signs reviewed and stable  Post vital signs: Reviewed and stable  Last Vitals:  Vitals Value Taken Time  BP 142/70 02/08/21 1547  Temp 36.3 C 02/08/21 1547  Pulse 68 02/08/21 1548  Resp 11 02/08/21 1548  SpO2 95 % 02/08/21 1548  Vitals shown include unvalidated device data.  Last Pain:  Vitals:   02/08/21 1319  TempSrc: Oral  PainSc: 0-No pain      Patients Stated Pain Goal: 5 (03/49/17 9150)  Complications: No complications documented.

## 2021-02-08 NOTE — Anesthesia Preprocedure Evaluation (Addendum)
Anesthesia Evaluation  Patient identified by MRN, date of birth, ID band Patient awake    Reviewed: Allergy & Precautions, NPO status , Patient's Chart, lab work & pertinent test results  History of Anesthesia Complications Negative for: history of anesthetic complications  Airway Mallampati: II  TM Distance: >3 FB Neck ROM: Full    Dental  (+) Edentulous Upper   Pulmonary asthma , COPD, former smoker,    Pulmonary exam normal        Cardiovascular Normal cardiovascular exam  HLD   Neuro/Psych negative neurological ROS  negative psych ROS   GI/Hepatic negative GI ROS, Neg liver ROS,   Endo/Other  negative endocrine ROS  Renal/GU negative Renal ROS   Bladder cancer    Musculoskeletal negative musculoskeletal ROS (+)   Abdominal   Peds  Hematology negative hematology ROS (+)   Anesthesia Other Findings   Reproductive/Obstetrics                            Anesthesia Physical Anesthesia Plan  ASA: II  Anesthesia Plan: General   Post-op Pain Management:    Induction: Intravenous  PONV Risk Score and Plan: 2 and Ondansetron, Dexamethasone, Midazolam and Treatment may vary due to age or medical condition  Airway Management Planned: LMA  Additional Equipment: None  Intra-op Plan:   Post-operative Plan: Extubation in OR  Informed Consent: I have reviewed the patients History and Physical, chart, labs and discussed the procedure including the risks, benefits and alternatives for the proposed anesthesia with the patient or authorized representative who has indicated his/her understanding and acceptance.     Dental advisory given  Plan Discussed with:   Anesthesia Plan Comments:         Anesthesia Quick Evaluation

## 2021-02-08 NOTE — Anesthesia Procedure Notes (Signed)
Procedure Name: LMA Insertion Date/Time: 02/08/2021 3:06 PM Performed by: Rogers Blocker, CRNA Pre-anesthesia Checklist: Patient identified, Emergency Drugs available, Suction available and Patient being monitored Patient Re-evaluated:Patient Re-evaluated prior to induction Oxygen Delivery Method: Circle System Utilized Preoxygenation: Pre-oxygenation with 100% oxygen Induction Type: IV induction Ventilation: Mask ventilation without difficulty LMA: LMA inserted LMA Size: 5.0 Number of attempts: 1 Placement Confirmation: positive ETCO2 Tube secured with: Tape Dental Injury: Teeth and Oropharynx as per pre-operative assessment

## 2021-02-08 NOTE — Anesthesia Postprocedure Evaluation (Signed)
Anesthesia Post Note  Patient: William [name]  Procedure(s) Performed: TRANSURETHRAL RESECTION OF BLADDER TUMOR (TURBT) (N/A Bladder) CYSTOSCOPY WITH RETROGRADE PYELOGRAM (Bilateral Renal)     Patient location during evaluation: PACU Anesthesia Type: General Level of consciousness: sedated and patient cooperative Pain management: pain level controlled Vital Signs Assessment: post-procedure vital signs reviewed and stable Respiratory status: spontaneous breathing Cardiovascular status: stable Anesthetic complications: no   No complications documented.  Last Vitals:  Vitals:   02/08/21 1630 02/08/21 1715  BP: (!) 154/88 136/85  Pulse: 72 71  Resp: 14 14  Temp:  (!) 36.4 C  SpO2: 97% 98%    Last Pain:  Vitals:   02/08/21 1715  TempSrc:   PainSc: 0-No pain                 Nolon Nations

## 2021-02-08 NOTE — H&P (Signed)
William [name] is an 80 y.o. male.    Chief Complaint: Pre-OP Transurethral Resection of Bladder Tumor  HPI:   1 - Gross Hematuria / Bladder Cancer - New gross hematuria 2022. CMP normal. UA without infectious parameters. Non-con CT 12/2020 with ?left bladder lesion, no stones or upper tract masses. Cr 0.7. Cysto 12/2020 confirms sessile / nodular 3cm left lateral / trigone mass. Used to work in [place] [employer].   2 - Prostate Screening - No FHX prostate cancer. Undergoing "for cause" eval 2022 at age 64 in setting of hematuria. PSA 12/2020 0.81.  PMH sig for OA, L spine surgery, open appy then bowel reseciton in teens (lower midline and RLQ scars). No ischemic CV disease / blood thinners. 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 Used to be avid runner. His PCP is Dorcas Mcmurray MD with [facility].   Today "[name]" is seen to proceed with TURBT. c19 screen negative.     Past Medical History:  Diagnosis Date  . Bladder cancer ([place])    Pt says small tumor not cancer  . Bleeding gums 11/11/2015  . COVID 09/23/2020   back ache hurt all over x few days all symptoms reolved  . Dyslipidemia (high LDL; low HDL)   . Low back pain 12/27/2014  . Medial epicondylitis of right elbow 12/13/2014  . Numbness and tingling of right leg   . Tinnitus    all the time  . Toenail fungus    2 big toes  . Wears glasses     Past Surgical History:  Procedure Laterality Date  . bone chip removed from right heel  1998  . FINGER AMPUTATION     injured by table saw, left 4th and 5th fingers  . KNEE ARTHROSCOPY W/ MENISCAL REPAIR  2012 2014   left  . KNEE ARTHROSCOPY W/ MENISCAL REPAIR  11/2011   left  . LACERATION REPAIR  1962   forehead  . LUMBAR DISC SURGERY  09/2007   bone spurs, stenosis, L3-4, 4-5   . POSTERIOR LUMBAR FUSION 4 LEVEL N/A 10/13/2013   Procedure: Lumbar Two-Three, Lumbar Three-Four, Lumbar Four-Five, Lumbar  Five-Sacral One posterior lumbar interbody fusion with interbody prosthesis posterior lateral arthrodesis and posterior segmental instrumentation;  Surgeon: [name], MD;  Location: [place] [facility];  Service: Neurosurgery;  Laterality: N/A;  POSTERIOR LUMBAR FUSION 4 LEVEL  . SKIN GRAFT     from right thigh to left forehead  . SMALL INTESTINE SURGERY  2585   ileitis complications    Family History  Problem Relation Age of Onset  . Stroke Mother   . Heart disease Father   . Stroke Father   . Diabetes Father   . Obesity Brother   . Obesity Brother   . Cancer Maternal Grandfather   . Cancer Maternal Grandmother   . Drug abuse Daughter    Social History:  reports that he quit smoking about 34 years ago. His smoking use included cigarettes. He has a 45.00 pack-year smoking history. He has never used smokeless tobacco. He reports previous alcohol use. He reports that he does not use drugs.  Allergies:  Allergies  Allergen Reactions  . Hydrocodone Other (See Comments)    Causes constipation even with stool softner    No medications prior to admission.    No results found for this or any previous visit (from the past 48  hour(s)). No results found.  Review of Systems  Constitutional: Negative for chills and fever.  All other systems reviewed and are negative.   Height 5' 7.25" (1.708 m), weight 72.1 kg. Physical Exam Vitals reviewed.  HENT:     Nose: Nose normal.  Eyes:     Pupils: Pupils are equal, round, and reactive to light.  Cardiovascular:     Rate and Rhythm: Normal rate.     Pulses: Normal pulses.  Abdominal:     General: Abdomen is flat.     Comments: Prior scars w/o hernias.   Genitourinary:    Penis: Normal.   Musculoskeletal:     Cervical back: Normal range of motion.  Skin:    General: Skin is warm.  Neurological:     Mental Status: He is alert.  Psychiatric:        Mood and Affect: Mood normal.      Assessment/Plan  Proceed as planned with  cysto, bilateral retrogrades and TURBT. Risks, benefits, alternatives, expected peri-op course including possible need for post-op foley discussed previously and reiterated today.    [name], MD 02/08/2021, 8:10 AM

## 2021-02-08 NOTE — Brief Op Note (Signed)
02/08/2021  3:38 PM  PATIENT:  William [name]  80 y.o. male  PRE-OPERATIVE DIAGNOSIS:  BLADDER CANCER  POST-OPERATIVE DIAGNOSIS:  BLADDER CANCER  PROCEDURE:  Procedure(s): TRANSURETHRAL RESECTION OF BLADDER TUMOR (TURBT) (N/A) CYSTOSCOPY WITH RETROGRADE PYELOGRAM (Bilateral)  SURGEON:  Surgeon(s) and Role:    * [name], MD - Primary  PHYSICIAN ASSISTANT:   ASSISTANTS: none   ANESTHESIA:   general  EBL:  20mL   BLOOD ADMINISTERED:none  DRAINS: none   LOCAL MEDICATIONS USED:  NONE  SPECIMEN:  Source of Specimen:  bladder tumor, base of bladder tumor  DISPOSITION OF SPECIMEN:  PATHOLOGY  COUNTS:  YES  TOURNIQUET:  * No tourniquets in log *  DICTATION: .Other Dictation: Dictation Number [id number]  PLAN OF CARE: Discharge to home after PACU  PATIENT DISPOSITION:  PACU - hemodynamically stable.   Delay start of Pharmacological VTE agent (>24hrs) due to surgical blood loss or risk of bleeding: not applicable

## 2021-02-08 NOTE — Discharge Instructions (Signed)
1 - You may have urinary urgency (bladder spasms) and bloody urine on / off for up to 2 weeks. This is normal.  2 - Call MD or go to ER for fever >102, severe pain / nausea / vomiting not relieved by medications, or acute change in medical status   Post Anesthesia Home Care Instructions  Activity: Get plenty of rest for the remainder of the day. A responsible adult should stay with you for 24 hours following the procedure.  For the next 24 hours, DO NOT: -Drive a car -Paediatric nurse -Drink alcoholic beverages -Take any medication unless instructed by your physician -Make any legal decisions or sign important papers.  Meals: Start with liquid foods such as gelatin or soup. Progress to regular foods as tolerated. Avoid greasy, spicy, heavy foods. If nausea and/or vomiting occur, drink only clear liquids until the nausea and/or vomiting subsides. Call your physician if vomiting continues.  Special Instructions/Symptoms: Your throat may feel dry or sore from the anesthesia or the breathing tube placed in your throat during surgery. If this causes discomfort, gargle with warm salt water. The discomfort should disappear within 24 hours.  If you had a scopolamine patch placed behind your ear for the management of post- operative nausea and/or vomiting:  1. The medication in the patch is effective for 72 hours, after which it should be removed.  Wrap patch in a tissue and discard in the trash. Wash hands thoroughly with soap and water. 2. You may remove the patch earlier than 72 hours if you experience unpleasant side effects which may include dry mouth, dizziness or visual disturbances. 3. Avoid touching the patch. Wash your hands with soap and water after contact with the patch.

## 2021-02-09 LAB — SURGICAL PATHOLOGY

## 2021-02-09 NOTE — Op Note (Signed)
NAME: William [name], William [name] MEDICAL RECORD YP:[id number] ACCOUNT [id number] DATE OF BIRTH:March 04, 1941 FACILITY: [facility] LOCATION: [facility] PHYSICIAN:Tarnesha Ulloa [name], MD  OPERATIVE REPORT  DATE OF PROCEDURE:  02/08/2021  PREOPERATIVE DIAGNOSIS:  Bladder cancer.  POSTOPERATIVE DIAGNOSIS:  Bladder cancer.  PROCEDURE: 1.  Transection of bladder tumor, volume medium. 2.  Bilateral retrograde pyelograms, interpretation.  ESTIMATED BLOOD LOSS:  10 mL.  COMPLICATIONS:  None.  SPECIMENS: 1.  Bladder tumor. 2.  Base of bladder tumor.  FINDINGS: 1.  Very sessile and nodular-appearing tumor in the left lateral trigone area just lateral to the left ureteral orifice, highly concerning for muscle invasive disease. 2.  Unremarkable bilateral pyelograms.  INDICATIONS:  The patient is a pleasant 80 year old man with excellent functional status, who was found incidentally to have a bladder mass on CAT scan.  He was referred for management.  Office cystoscopy corroborated a predominantly nodular, partially  papillary tumor in the bladder base.  Options were discussed including recommended path of transurethral resection for diagnostic and therapeutic intent and he wished to proceed.  Informed consent was obtained and placed in medical record.  DESCRIPTION OF PROCEDURE:  The patient being himself, procedure being transection of bladder tumor, bilateral retrograde pyelograms was confirmed.  Procedure timeout was performed.  Intravenous antibiotics administered.  General anesthesia induced.  The  patient was placed into a low lithotomy position.  Sterile field was created, prepped and draped the patient's penis, perineum and proximal thighs using iodine.  Cystourethroscopy was performed using 21-French rigid cystoscope with offset lens.   Inspection of anterior and posterior urethra was unremarkable.  Inspection of bladder with some mild trabeculation and the prominently nodular partially papillary tumor in the  left lateral trigone area just lateral to the left ureteral orifice, not  directly involving, but very, very close.  Estimated size approximately 3.5 cm.  Attention was directed at retrograde pyelograms.  First, the right ureteral orifice was cannulated with a 6-French renal catheter and right retrograde pyelogram was  obtained.  Right retrograde pyelogram demonstrated a single right ureter with single system right kidney.  No filling defects or narrowing noted.  Similarly, left retrograde pyelogram was obtained.  Left retrograde pyelogram demonstrated a single left ureter with single system left kidney.  No filling defects or narrowing noted.  Next, a cystoscope was exchanged for a 26-French resectoscope sheath with visual obturator and using resectoscope loop,  very careful resection was performed down to superficial fibromuscular stroma of the urinary bladder, thus resecting all accessible portions of the tumor completely.  The base of this still did appear to have the tumor and so was highly concerning for  muscle invasive disease.  These tumor fragments were irrigated and set aside and labeled as bladder tumor.  Next, cold cup biopsy forceps were used to obtain deep seromuscular bites labeled as base of bladder tumor following which the entire base of the  resection area was carefully fulgurated, taking exquisite care to avoid any direct injury to ureteral orifice, which did not occur.  Following this, there was excellent hemostasis and complete resection of all intraluminal tumor.  Left ureteral orifice  remained visually patent.  Bladder was partially empty per cystoscope.  Procedure was then terminated.  The patient tolerated the procedure well.  No immediate perioperative complications.  The patient was taken to [facility] in stable  condition.  Plan for discharge home after void.  IN/NUANCE  D:02/08/2021 T:02/09/2021 JOB:[id number]

## 2021-02-10 ENCOUNTER — Encounter (HOSPITAL_BASED_OUTPATIENT_CLINIC_OR_DEPARTMENT_OTHER): Payer: Self-pay | Admitting: Urology

## 2021-02-21 DIAGNOSIS — C670 Malignant neoplasm of trigone of bladder: Secondary | ICD-10-CM | POA: Diagnosis not present

## 2021-02-28 ENCOUNTER — Inpatient Hospital Stay: Payer: Medicare HMO | Attending: Oncology | Admitting: Oncology

## 2021-02-28 ENCOUNTER — Other Ambulatory Visit: Payer: Self-pay

## 2021-02-28 VITALS — BP 129/81 | HR 42 | Temp 97.6°F | Resp 15 | Ht 67.25 in | Wt 177.8 lb

## 2021-02-28 DIAGNOSIS — C670 Malignant neoplasm of trigone of bladder: Secondary | ICD-10-CM

## 2021-02-28 DIAGNOSIS — C679 Malignant neoplasm of bladder, unspecified: Secondary | ICD-10-CM | POA: Diagnosis not present

## 2021-02-28 DIAGNOSIS — Z452 Encounter for adjustment and management of vascular access device: Secondary | ICD-10-CM | POA: Insufficient documentation

## 2021-02-28 DIAGNOSIS — Z5111 Encounter for antineoplastic chemotherapy: Secondary | ICD-10-CM | POA: Insufficient documentation

## 2021-02-28 MED ORDER — PROCHLORPERAZINE MALEATE 10 MG PO TABS: 10.0000 mg | ORAL_TABLET | Freq: Four times a day (QID) | ORAL | 0 refills | Status: DC | PRN

## 2021-02-28 MED ORDER — LIDOCAINE-PRILOCAINE 2.5-2.5 % EX CREA: 1.0000 "application " | TOPICAL_CREAM | CUTANEOUS | 0 refills | Status: DC | PRN

## 2021-02-28 NOTE — Progress Notes (Signed)
Reason for the request: Bladder cancer  HPI: I was asked by [name]  to evaluate [name] for evaluation of bladder cancer.  He is a 80 year old man with a history of hyperlipidemia no significant comorbid conditions who was noted to have hematuria in January of 2021.  He underwent a CT scan on January 20, 2021 which showed no evidence of urolithiasis or hydronephrosis at that time but found to have a 2 cm area of increased attenuation in the left posterior bladder wall suspicious for neoplasm.  He was evaluated by [name] and underwent a cystoscopy on February 08, 2021.  The cystoscopy revealed sessile and nodular appearing tumor in the left lateral trigone area that was resected at that time.  He had unremarkable bilateral pyelograms without any evidence of upper tract tumors.  The final pathology from his TURBT showed a high-grade urothelial carcinoma with a 20% squamous differentiation and muscle invasion.  Based on these findings, [name] recommended that of radical cystectomy after neoadjuvant chemotherapy.  Clinically, he reports of feeling reasonably well at this time without any hematuria or dysuria.  He denies any flank pain or discomfort.  He does have chronic back issues which has not changed at this time.  He does not report any headaches, blurry vision, syncope or seizures. Does not report any fevers, chills or sweats.  Does not report any cough, wheezing or hemoptysis.  Does not report any chest pain, palpitation, orthopnea or leg edema.  Does not report any nausea, vomiting or abdominal pain.  Does not report any constipation or diarrhea.  Does not report any skeletal complaints.    Does not report frequency, urgency or hematuria.  Does not report any skin rashes or lesions. Does not report any heat or cold intolerance.  Does not report any lymphadenopathy or petechiae.  Does not report any anxiety or depression.  Remaining review of systems is negative.    Past Medical History:   Diagnosis Date  . Bladder cancer ([place])    Pt says small tumor not cancer  . Bleeding gums 11/11/2015  . COVID 09/23/2020   back ache hurt all over x few days all symptoms reolved  . Dyslipidemia (high LDL; low HDL)   . Low back pain 12/27/2014  . Medial epicondylitis of right elbow 12/13/2014  . Numbness and tingling of right leg   . Tinnitus    all the time  . Toenail fungus    2 big toes  . Wears glasses   :  Past Surgical History:  Procedure Laterality Date  . bone chip removed from right heel  1998  . CYSTOSCOPY W/ RETROGRADES Bilateral 02/08/2021   Procedure: CYSTOSCOPY WITH RETROGRADE PYELOGRAM;  Surgeon: [name], MD;  Location: [facility];  Service: Urology;  Laterality: Bilateral;  . FINGER AMPUTATION     injured by table saw, left 4th and 5th fingers  . KNEE ARTHROSCOPY W/ MENISCAL REPAIR  2012 2014   left  . KNEE ARTHROSCOPY W/ MENISCAL REPAIR  11/2011   left  . LACERATION REPAIR  1962   forehead  . LUMBAR DISC SURGERY  09/2007   bone spurs, stenosis, L3-4, 4-5   . POSTERIOR LUMBAR FUSION 4 LEVEL N/A 10/13/2013   Procedure: Lumbar Two-Three, Lumbar Three-Four, Lumbar Four-Five, Lumbar Five-Sacral One posterior lumbar interbody fusion with interbody prosthesis posterior lateral arthrodesis and posterior segmental instrumentation;  Surgeon: [name], MD;  Location: [facility];  Service: Neurosurgery;  Laterality: N/A;  POSTERIOR  LUMBAR FUSION 4 LEVEL  . SKIN GRAFT     from right thigh to left forehead  . SMALL INTESTINE SURGERY  7619   ileitis complications  . TRANSURETHRAL RESECTION OF BLADDER TUMOR N/A 02/08/2021   Procedure: TRANSURETHRAL RESECTION OF BLADDER TUMOR (TURBT);  Surgeon: [name], MD;  Location: [facility];  Service: Urology;  Laterality: N/A;  :   Current Outpatient Medications:  .  b complex vitamins tablet, Take 1 tablet by mouth daily., Disp: , Rfl:  .  fluticasone (FLONASE) 50 MCG/ACT nasal  spray, Place 2 sprays into both nostrils daily., Disp: 16 g, Rfl: 6 .  gabapentin (NEURONTIN) 300 MG capsule, Take 300 mg by mouth at bedtime., Disp: , Rfl:  .  Iron-Vitamins (GERITOL) LIQD, Take 5 mLs by mouth., Disp: , Rfl:  .  Multiple Vitamin (MULTIVITAMIN WITH MINERALS) TABS tablet, Take 1 tablet by mouth daily., Disp: , Rfl:  .  naproxen (NAPROSYN) 500 MG tablet, Take 1 tablet (500 mg total) by mouth 2 (two) times daily as needed., Disp: 30 tablet, Rfl: 0 .  senna-docusate (SENOKOT-S) 8.6-50 MG tablet, Take 1 tablet by mouth 2 (two) times daily. While taking stronger pain meds to prevent constipation., Disp: 10 tablet, Rfl: 0 .  tamsulosin (FLOMAX) 0.4 MG CAPS capsule, Take 1 capsule (0.4 mg total) by mouth daily as needed. For urinary urgeny / weak stream after tumor resection, Disp: 30 capsule, Rfl: 1 .  terbinafine (LAMISIL) 250 MG tablet, Take 1 tablet (250 mg total) by mouth daily. (Patient taking differently: Take 250 mg by mouth at bedtime.), Disp: 90 tablet, Rfl: 0 .  tiZANidine (ZANAFLEX) 4 MG tablet, Take 4 mg by mouth at bedtime., Disp: , Rfl:  .  traMADol (ULTRAM) 50 MG tablet, Take 1 tablet (50 mg total) by mouth every 6 (six) hours as needed for moderate pain. Post-operatively, Disp: 15 tablet, Rfl: 0:  Allergies  Allergen Reactions  . Hydrocodone Other (See Comments)    Causes constipation even with stool softner  :  Family History  Problem Relation Age of Onset  . Stroke Mother   . Heart disease Father   . Stroke Father   . Diabetes Father   . Obesity Brother   . Obesity Brother   . Cancer Maternal Grandfather   . Cancer Maternal Grandmother   . Drug abuse Daughter   :  Social History   Socioeconomic History  . Marital status: Married    Spouse name: Alleen Borne  . Number of children: 3  . Years of education: 2  . Highest education level: Some college, no degree  Occupational History  . Occupation: Retired- Ambulance person  Tobacco Use  .  Smoking status: Former Smoker    Packs/day: 1.50    Years: 30.00    Pack years: 45.00    Types: Cigarettes    Quit date: 04/02/1986    Years since quitting: 34.9  . Smokeless tobacco: Never Used  . Tobacco comment: no plans to start  Vaping Use  . Vaping Use: Never used  Substance and Sexual Activity  . Alcohol use: Not Currently  . Drug use: Never  . Sexual activity: Yes    Partners: Female  Other Topics Concern  . Not on file  Social History Narrative   Emergency Contact: wife, Marquin Patino, [phone]   Who lives with you: Lives with 3rd wife and two sons   House is two level; has no trouble with the stairs. Smoke alarms  present. No grab bars. No tripping hazards. Rugs are fastened down.   Any pets: poodle dog Location manager) and 10 parakeets   Diet: Patient has a varied diet and takes vitamin supplements. Uses exercise equipment at home (cycling, treadmill)    Exercise: Patient going to [facility] 7 days a week, bike, strength training   Seatbelts: Patient reports wearing seatbelt occasionally   Hobbies: Running, exercising, watching TV, parakeets   Education: some college.               Social Determinants of Health   Financial Resource Strain: Not on file  Food Insecurity: Not on file  Transportation Needs: Not on file  Physical Activity: Not on file  Stress: Not on file  Social Connections: Not on file  Intimate Partner Violence: Not on file  :  Pertinent items are noted in HPI.  Exam: Blood pressure 129/81, pulse (!) 42, temperature 97.6 F (36.4 C), temperature source Tympanic, resp. rate 15, height 5' 7.25" (1.708 m), weight 177 lb 12.8 oz (80.6 kg), SpO2 98 %.  ECOG 0 General appearance: alert and cooperative appeared without distress. Head: atraumatic without any abnormalities. Eyes: conjunctivae/corneas clear. PERRL.  Sclera anicteric. Throat: lips, mucosa, and tongue normal; without oral thrush or ulcers. Resp: clear to auscultation bilaterally without rhonchi,  wheezes or dullness to percussion. Cardio: regular rate and rhythm, S1, S2 normal, no murmur, click, rub or gallop GI: soft, non-tender; bowel sounds normal; no masses,  no organomegaly Skin: Skin color, texture, turgor normal. No rashes or lesions Lymph nodes: Cervical, supraclavicular, and axillary nodes normal. Neurologic: Grossly normal without any motor, sensory or deep tendon reflexes. Musculoskeletal: No joint deformity or effusion.  CBC    Component Value Date/Time   WBC 4.3 09/23/2020 1133   RBC 5.07 09/23/2020 1133   HGB 15.6 02/08/2021 1338   HGB 14.5 09/21/2020 0942   HCT 46.0 02/08/2021 1338   HCT 41.9 09/21/2020 0942   PLT 153 09/23/2020 1133   PLT 194 09/21/2020 0942   MCV 93.7 09/23/2020 1133   MCV 89 09/21/2020 0942   MCH 29.6 09/23/2020 1133   MCHC 31.6 09/23/2020 1133   RDW 13.0 09/23/2020 1133   RDW 12.2 09/21/2020 0942   LYMPHSABS 0.9 09/23/2020 1133   MONOABS 0.5 09/23/2020 1133   EOSABS 0.0 09/23/2020 1133   BASOSABS 0.0 09/23/2020 1133   Results for CHEO, SELVEY (MRN [id number]) as of 02/28/2021 13:54  Ref. Range 02/08/2021 13:38  Hemoglobin Latest Ref Range: 13.0 - 17.0 g/dL 15.6  HCT Latest Ref Range: 39.0 - 52.0 % 46.0   Results for KAYAN, BLISSETT (MRN [id number]) as of 02/28/2021 13:54  Ref. Range 02/08/2021 13:38  Sodium Latest Ref Range: 135 - 145 mmol/L 141  Potassium Latest Ref Range: 3.5 - 5.1 mmol/L 3.9  Chloride Latest Ref Range: 98 - 111 mmol/L 104  Glucose Latest Ref Range: 70 - 99 mg/dL 91  BUN Latest Ref Range: 8 - 23 mg/dL 8  Creatinine Latest Ref Range: 0.61 - 1.24 mg/dL 0.70  Calcium Ionized Latest Ref Range: 1.15 - 1.40 mmol/L 1.31    IMPRESSION: No evidence of urolithiasis or hydronephrosis.  Subtle 2 cm area of increased attenuation along the left posterior bladder wall, suspicious for bladder neoplasm. Recommend urology consultation and cystoscopy for further evaluation.   Assessment and Plan:    80 year old man  with:  1.  Bladder cancer diagnosed in February 2022.  He presented with hematuria and CT scan showed 2 cm area  of the left posterior bladder wall.  TURBT on February 9 confirmed the presence of high-grade urothelial carcinoma with a 20% squamous differentiation and muscle invasion.  The natural course of this disease was reviewed at this time and treatment options were discussed.  Radical cystectomy remains a an excellent curative option at this time.  Alternative option would be trimodality therapy with chemotherapy and radiation after maximal TURBT.  He has opted to proceed with radical cystectomy and the role of neoadjuvant chemotherapy were discussed today   The logistics and rationale for using chemotherapy was reviewed today in detail.  Complication associated with cisplatin and gemcitabine chemotherapy was discussed.  These complications include nausea, vomiting, myelosuppression, fatigue, infusion related complications, renal insufficiency, neutropenia, neutropenic sepsis and rarely serious thrombosis, hospitalization and death.  The benefit would also if he has an excellent response to chemotherapy, curative surgical resection may be attempted.  The plan is to treat with gemcitabine and cisplatin on day 1, gemcitabine day 8 out of a 21-day cycle.  Anticipate needing 4 cycles of therapy    After discussion today, he is agreeable to proceed after chemo education class.     2.  IV access: Risks and benefits of using Port-A-Cath versus peripheral veins was discussed today.  Complication associated with Port-A-Cath insertion include bleeding, infection and thrombosis.  After discussing the risks and benefits, he is agreeable to proceed.   3.  Antiemetics: Prescription for Compazine was made available to him.   4.  Renal function surveillance:  Baseline kidney function is normal at this time.   5.  Goals of care:  Treatment will be curative.   6.  Follow-up: will be in the immediate future to  start chemotherapy.  60  minutes were dedicated to this visit. The time was spent on reviewing laboratory data, imaging studies, discussing treatment options, and answering questions regarding future plan.     A copy of this consult has been forwarded to the requesting physician.

## 2021-02-28 NOTE — Progress Notes (Signed)

## 2021-03-01 ENCOUNTER — Telehealth: Payer: Self-pay

## 2021-03-01 NOTE — Telephone Encounter (Signed)
Returned call to patient and offered support. He seems to be coping okay. He expresses regret for the decision not to get imaging earlier in the course of his hematuria. He's getting a port placed on Monday and will begin chemotherapy soon thereafter.  Asked him to let me know if there is anything I can do to be helpful to him as he navigates this new diagnosis. Patient appreciative of the call.  [name], MD

## 2021-03-01 NOTE — Telephone Encounter (Signed)
Patient calls nurse line requesting to speak with PCP. Patient reports he would like to notify PCP about recent updates with his health. Patient reports he is about to start chemo and just had surgery. I attempted to make an apt, however no openings for PCP until the end of March. Patient does not want to wait that long. Please call him at your convenience.

## 2021-03-02 ENCOUNTER — Telehealth: Payer: Self-pay | Admitting: Oncology

## 2021-03-02 NOTE — Telephone Encounter (Signed)
Scheduled per 03/01 los, patient has been called and voicemail was left. ?

## 2021-03-06 ENCOUNTER — Other Ambulatory Visit: Payer: Self-pay | Admitting: Radiology

## 2021-03-07 ENCOUNTER — Other Ambulatory Visit: Payer: Self-pay

## 2021-03-07 ENCOUNTER — Ambulatory Visit (HOSPITAL_COMMUNITY)
Admission: RE | Admit: 2021-03-07 | Discharge: 2021-03-07 | Disposition: A | Discharge status: Home / Self Care | Payer: Medicare HMO | Source: Ambulatory Visit | Attending: Oncology | Admitting: Oncology

## 2021-03-07 ENCOUNTER — Other Ambulatory Visit: Payer: Self-pay | Admitting: Oncology

## 2021-03-07 ENCOUNTER — Encounter (HOSPITAL_COMMUNITY): Payer: Self-pay

## 2021-03-07 DIAGNOSIS — C670 Malignant neoplasm of trigone of bladder: Secondary | ICD-10-CM | POA: Diagnosis not present

## 2021-03-07 DIAGNOSIS — Z89022 Acquired absence of left finger(s): Secondary | ICD-10-CM | POA: Diagnosis not present

## 2021-03-07 DIAGNOSIS — Z452 Encounter for adjustment and management of vascular access device: Secondary | ICD-10-CM | POA: Diagnosis not present

## 2021-03-07 DIAGNOSIS — Z8249 Family history of ischemic heart disease and other diseases of the circulatory system: Secondary | ICD-10-CM | POA: Diagnosis not present

## 2021-03-07 DIAGNOSIS — E785 Hyperlipidemia, unspecified: Secondary | ICD-10-CM | POA: Insufficient documentation

## 2021-03-07 DIAGNOSIS — Z885 Allergy status to narcotic agent status: Secondary | ICD-10-CM | POA: Diagnosis not present

## 2021-03-07 DIAGNOSIS — Z87891 Personal history of nicotine dependence: Secondary | ICD-10-CM | POA: Diagnosis not present

## 2021-03-07 DIAGNOSIS — Z8616 Personal history of COVID-19: Secondary | ICD-10-CM | POA: Diagnosis not present

## 2021-03-07 DIAGNOSIS — Z79899 Other long term (current) drug therapy: Secondary | ICD-10-CM | POA: Insufficient documentation

## 2021-03-07 HISTORY — PX: IR IMAGING GUIDED PORT INSERTION: IMG5740

## 2021-03-07 MED ORDER — MIDAZOLAM HCL 2 MG/2ML IJ SOLN
INTRAMUSCULAR | Status: AC
  Filled 2021-03-07: qty 2

## 2021-03-07 MED ORDER — LIDOCAINE-EPINEPHRINE 1 %-1:100000 IJ SOLN
INTRAMUSCULAR | Status: AC
  Filled 2021-03-07: qty 1

## 2021-03-07 MED ORDER — FENTANYL CITRATE (PF) 100 MCG/2ML IJ SOLN
INTRAMUSCULAR | Status: AC
  Filled 2021-03-07: qty 2

## 2021-03-07 MED ORDER — SODIUM CHLORIDE 0.9 % IV SOLN: INTRAVENOUS | Status: AC

## 2021-03-07 MED ORDER — HEPARIN SOD (PORK) LOCK FLUSH 100 UNIT/ML IV SOLN
INTRAVENOUS | Status: AC
  Filled 2021-03-07: qty 5

## 2021-03-07 MED ORDER — LIDOCAINE-EPINEPHRINE 1 %-1:100000 IJ SOLN
INTRAMUSCULAR | Status: AC | PRN
  Administered 2021-03-07 (×2): 10 mL via INTRADERMAL

## 2021-03-07 MED ORDER — HEPARIN SOD (PORK) LOCK FLUSH 100 UNIT/ML IV SOLN
INTRAVENOUS | Status: AC | PRN
  Administered 2021-03-07: 500 [IU] via INTRAVENOUS

## 2021-03-07 MED ORDER — MIDAZOLAM HCL 2 MG/2ML IJ SOLN
INTRAMUSCULAR | Status: AC | PRN
  Administered 2021-03-07: 0.5 mg via INTRAVENOUS
  Administered 2021-03-07: 1 mg via INTRAVENOUS

## 2021-03-07 MED ORDER — FENTANYL CITRATE (PF) 100 MCG/2ML IJ SOLN
INTRAMUSCULAR | Status: AC | PRN
  Administered 2021-03-07: 50 ug via INTRAVENOUS

## 2021-03-07 NOTE — Consult Note (Signed)
Chief Complaint: Patient was seen in consultation today for Port-A-Cath placement  Referring Physician(s): [place] Physician: Jacqulynn Cadet  Patient Status: [place]  History of Present Illness: William [name] is a 80 y.o. male with history of bladder cancer diagnosed in February 2022.  He presented with hematuria and CT scan showed 2 cm area of the left posterior bladder wall.  TURBT on February 9 confirmed the presence of high-grade urothelial carcinoma with a 20% squamous differentiation and muscle invasion.  He presents today for Port-A-Cath placement for chemotherapy.  Additional medical history as below.  Past Medical History:  Diagnosis Date  . Bladder cancer ([place])    Pt says small tumor not cancer  . Bleeding gums 11/11/2015  . COVID 09/23/2020   back ache hurt all over x few days all symptoms reolved  . Dyslipidemia (high LDL; low HDL)   . Low back pain 12/27/2014  . Medial epicondylitis of right elbow 12/13/2014  . Numbness and tingling of right leg   . Tinnitus    all the time  . Toenail fungus    2 big toes  . Wears glasses     Past Surgical History:  Procedure Laterality Date  . bone chip removed from right heel  1998  . CYSTOSCOPY W/ RETROGRADES Bilateral 02/08/2021   Procedure: CYSTOSCOPY WITH RETROGRADE PYELOGRAM;  Surgeon: [name], MD;  Location: [facility];  Service: Urology;  Laterality: Bilateral;  . FINGER AMPUTATION     injured by table saw, left 4th and 5th fingers  . KNEE ARTHROSCOPY W/ MENISCAL REPAIR  2012 2014   left  . KNEE ARTHROSCOPY W/ MENISCAL REPAIR  11/2011   left  . LACERATION REPAIR  1962   forehead  . LUMBAR DISC SURGERY  09/2007   bone spurs, stenosis, L3-4, 4-5   . POSTERIOR LUMBAR FUSION 4 LEVEL N/A 10/13/2013   Procedure: Lumbar Two-Three, Lumbar Three-Four, Lumbar Four-Five, Lumbar Five-Sacral One posterior lumbar interbody fusion with interbody prosthesis posterior  lateral arthrodesis and posterior segmental instrumentation;  Surgeon: [name], MD;  Location: [place];  Service: Neurosurgery;  Laterality: N/A;  POSTERIOR LUMBAR FUSION 4 LEVEL  . SKIN GRAFT     from right thigh to left forehead  . SMALL INTESTINE SURGERY  7989   ileitis complications  . TRANSURETHRAL RESECTION OF BLADDER TUMOR N/A 02/08/2021   Procedure: TRANSURETHRAL RESECTION OF BLADDER TUMOR (TURBT);  Surgeon: [name], MD;  Location: [facility];  Service: Urology;  Laterality: N/A;    Allergies: Hydrocodone  Medications: Prior to Admission medications   Medication Sig Start Date End Date Taking? Authorizing Provider  b complex vitamins tablet Take 1 tablet by mouth daily.   Yes [provider]  fluticasone (FLONASE) 50 MCG/ACT nasal spray Place 2 sprays into both nostrils daily. 01/05/20  Yes [name], MD  gabapentin (NEURONTIN) 300 MG capsule Take 300 mg by mouth at bedtime.   Yes [provider]  Iron-Vitamins (GERITOL) LIQD Take 5 mLs by mouth.   Yes [provider]  Multiple Vitamin (MULTIVITAMIN WITH MINERALS) TABS tablet Take 1 tablet by mouth daily.   Yes [provider]  senna-docusate (SENOKOT-S) 8.6-50 MG tablet Take 1 tablet by mouth 2 (two) times daily. While taking stronger pain meds to prevent constipation. 02/08/21  Yes [name], MD  tamsulosin (FLOMAX) 0.4 MG CAPS capsule Take 1 capsule (0.4 mg total) by mouth daily as needed. For urinary  urgeny / weak stream after tumor resection 02/08/21  Yes [name], MD  terbinafine (LAMISIL) 250 MG tablet Take 1 tablet (250 mg total) by mouth daily. Patient taking differently: Take 250 mg by mouth at bedtime. 01/26/21 04/26/21 Yes McDonald, Stephan Minister, DPM  tiZANidine (ZANAFLEX) 4 MG tablet Take 4 mg by mouth at bedtime.   Yes [provider]  traMADol (ULTRAM) 50 MG tablet Take 1 tablet (50 mg total) by mouth every 6 (six) hours as needed  for moderate pain. Post-operatively 02/08/21 02/08/22 Yes [name], MD  lidocaine-prilocaine (EMLA) cream Apply 1 application topically as needed. 02/28/21   Wyatt Portela, MD  naproxen (NAPROSYN) 500 MG tablet Take 1 tablet (500 mg total) by mouth 2 (two) times daily as needed. 09/23/20   Martyn Malay, MD  prochlorperazine (COMPAZINE) 10 MG tablet Take 1 tablet (10 mg total) by mouth every 6 (six) hours as needed for nausea or vomiting. 02/28/21   Wyatt Portela, MD     Family History  Problem Relation Age of Onset  . Stroke Mother   . Heart disease Father   . Stroke Father   . Diabetes Father   . Obesity Brother   . Obesity Brother   . Cancer Maternal Grandfather   . Cancer Maternal Grandmother   . Drug abuse Daughter     Social History   Socioeconomic History  . Marital status: Married    Spouse name: Alleen Borne  . Number of children: 3  . Years of education: 80  . Highest education level: Some college, no degree  Occupational History  . Occupation: Retired- Ambulance person  Tobacco Use  . Smoking status: Former Smoker    Packs/day: 1.50    Years: 30.00    Pack years: 45.00    Types: Cigarettes    Quit date: 04/02/1986    Years since quitting: 34.9  . Smokeless tobacco: Never Used  . Tobacco comment: no plans to start  Vaping Use  . Vaping Use: Never used  Substance and Sexual Activity  . Alcohol use: Not Currently  . Drug use: Never  . Sexual activity: Yes    Partners: Female  Other Topics Concern  . Not on file  Social History Narrative   Emergency Contact: wife, Rahil Passey, [phone]   Who lives with you: Lives with 3rd wife and two sons   House is two level; has no trouble with the stairs. Smoke alarms present. No grab bars. No tripping hazards. Rugs are fastened down.   Any pets: poodle dog Location manager) and 10 parakeets   Diet: Patient has a varied diet and takes vitamin supplements. Uses exercise equipment at home (cycling, treadmill)     Exercise: Patient going to [facility] 7 days a week, bike, strength training   Seatbelts: Patient reports wearing seatbelt occasionally   Hobbies: Running, exercising, watching TV, parakeets   Education: some college.               Social Determinants of Health   Financial Resource Strain: Not on file  Food Insecurity: Not on file  Transportation Needs: Not on file  Physical Activity: Not on file  Stress: Not on file  Social Connections: Not on file      Review of Systems currently denies fever, headache, chest pain, dyspnea, cough, abdominal/back pain, nausea, vomiting or bleeding  Vital Signs: BP 138/83   Pulse 66   Temp 97.7 F (36.5 C) (Oral)   Resp 18  Ht 5' 7.25" (1.708 m)   Wt 177 lb 11.1 oz (80.6 kg)   SpO2 97%   BMI 27.62 kg/m   Physical Exam awake, alert.  Chest clear to auscultation bilaterally.  Heart with regular rate and rhythm.  Abdomen soft, positive bowel sounds, nontender; some trace left pretibial edema  Imaging: No results found.  Labs:  CBC: Recent Labs    09/21/20 0942 09/23/20 1133 02/08/21 1338  WBC 3.9 4.3  --   HGB 14.5 15.0 15.6  HCT 41.9 47.5 46.0  PLT 194 153  --     COAGS: No results for input(s): INR, APTT in the last 8760 hours.  BMP: Recent Labs    09/21/20 0942 09/23/20 1133 02/08/21 1338  NA 137 135 141  K 4.4 4.1 3.9  CL 97 98 104  CO2 24 26  --   GLUCOSE 94 110* 91  BUN 5* 9 8  CALCIUM 8.9 8.8*  --   CREATININE 0.76 0.71 0.70  GFRNONAA 87 >60  --   GFRAA 101 >60  --     LIVER FUNCTION TESTS: Recent Labs    09/21/20 0942  BILITOT 0.3  AST 34  ALT 20  ALKPHOS 66  PROT 7.3  ALBUMIN 4.2    TUMOR MARKERS: No results for input(s): AFPTM, CEA, CA199, CHROMGRNA in the last 8760 hours.  Assessment and Plan: 80 y.o. male with history of bladder cancer diagnosed in February 2022.  He presented with hematuria and CT scan showed 2 cm area of the left posterior bladder wall.  TURBT on February 9 confirmed the  presence of high-grade urothelial carcinoma with a 20% squamous differentiation and muscle invasion.  He presents today for Port-A-Cath placement for chemotherapy.Risks and benefits of image guided port-a-catheter placement was discussed with the patient including, but not limited to bleeding, infection, pneumothorax, or fibrin sheath development and need for additional procedures.  All of the patient's questions were answered, patient is agreeable to proceed. Consent signed and in chart.     Thank you for this interesting consult.  I greatly enjoyed meeting DERELL BRUUN and look forward to participating in their care.  A copy of this report was sent to the requesting provider on this date.  Electronically Signed: D. Rowe Robert, PA-C 03/07/2021, 11:15 AM   I spent a total of 25 minutes   in face to face in clinical consultation, greater than 50% of which was counseling/coordinating care for Port-A-Cath placement

## 2021-03-07 NOTE — Discharge Instructions (Addendum)
Urgent needs - Interventional Radiology on call MD [phone] ° °Wound - May remove dressing and shower in 24 to 48 hours.  Keep site clean and dry.  Replace with bandaid as needed.  Do not submerge in tub or water until site healing well. If closed with glue, glue will flake off on its own. ° °If ordered by your provider, may start Emla cream in 2 weeks or after incision is healed. ° °After completion of treatment, your provider should have you set up for monthly port flushes.  ° ° °Implanted Port Insertion, Care After °This sheet gives you information about how to care for yourself after your procedure. Your health care provider may also give you more specific instructions. If you have problems or questions, contact your health care provider. °What can I expect after the procedure? °After the procedure, it is common to have: °· Discomfort at the port insertion site. °· Bruising on the skin over the port. This should improve over 3-4 days. °Follow these instructions at home: °Port care °· After your port is placed, you will get a manufacturer's information card. The card has information about your port. Keep this card with you at all times. °· Take care of the port as told by your health care provider. Ask your health care provider if you or a family member can get training for taking care of the port at home. A home health care nurse may also take care of the port. °· Make sure to remember what type of port you have. °Incision care °· Follow instructions from your health care provider about how to take care of your port insertion site. Make sure you: °? Wash your hands with soap and water before and after you change your bandage (dressing). If soap and water are not available, use hand sanitizer. °? Change your dressing as told by your health care provider. °? Leave stitches (sutures), skin glue, or adhesive strips in place. These skin closures may need to stay in place for 2 weeks or longer. If adhesive strip  edges start to loosen and curl up, you may trim the loose edges. Do not remove adhesive strips completely unless your health care provider tells you to do that. °· Check your port insertion site every day for signs of infection. Check for: °? Redness, swelling, or pain. °? Fluid or blood. °? Warmth. °? Pus or a bad smell.  °  °  °Activity °· Return to your normal activities as told by your health care provider. Ask your health care provider what activities are safe for you. °· Do not lift anything that is heavier than 10 lb (4.5 kg), or the limit that you are told, until your health care provider says that it is safe. °General instructions °· Take over-the-counter and prescription medicines only as told by your health care provider. °· Do not take baths, swim, or use a hot tub until your health care provider approves. Ask your health care provider if you may take showers. You may only be allowed to take sponge baths. °· Do not drive for 24 hours if you were given a sedative during your procedure. °· Wear a medical alert bracelet in case of an emergency. This will tell any health care providers that you have a port. °· Keep all follow-up visits as told by your health care provider. This is important. °Contact a health care provider if: °· You cannot flush your port with saline as directed, or you cannot draw blood   from the port. °· You have a fever or chills. °· You have redness, swelling, or pain around your port insertion site. °· You have fluid or blood coming from your port insertion site. °· Your port insertion site feels warm to the touch. °· You have pus or a bad smell coming from the port insertion site. °Get help right away if: °· You have chest pain or shortness of breath. °· You have bleeding from your port that you cannot control. °Summary °· Take care of the port as told by your health care provider. Keep the manufacturer's information card with you at all times. °· Change your dressing as told by your  health care provider. °· Contact a health care provider if you have a fever or chills or if you have redness, swelling, or pain around your port insertion site. °· Keep all follow-up visits as told by your health care provider. °This information is not intended to replace advice given to you by your health care provider. Make sure you discuss any questions you have with your health care provider. °Document Revised: 07/15/2018 Document Reviewed: 07/15/2018 °Elsevier Patient Education © 2021 [employer] ° ° °Moderate Conscious Sedation, Adult, Care After °This sheet gives you information about how to care for yourself after your procedure. Your health care provider may also give you more specific instructions. If you have problems or questions, contact your health care provider. °What can I expect after the procedure? °After the procedure, it is common to have: °· Sleepiness for several hours. °· Impaired judgment for several hours. °· Difficulty with balance. °· Vomiting if you eat too soon. °Follow these instructions at home: °For the time period you were told by your health care provider: °· Rest. °· Do not participate in activities where you could fall or become injured. °· Do not drive or use machinery. °· Do not drink alcohol. °· Do not take sleeping pills or medicines that cause drowsiness. °· Do not make important decisions or sign legal documents. °· Do not take care of children on your own.  °  °  °Eating and drinking °· Follow the diet recommended by your health care provider. °· Drink enough fluid to keep your urine pale yellow. °· If you vomit: °? Drink water, juice, or soup when you can drink without vomiting. °? Make sure you have little or no nausea before eating solid foods.   °General instructions °· Take over-the-counter and prescription medicines only as told by your health care provider. °· Have a responsible adult stay with you for the time you are told. It is important to have someone help  care for you until you are awake and alert. °· Do not smoke. °· Keep all follow-up visits as told by your health care provider. This is important. °Contact a health care provider if: °· You are still sleepy or having trouble with balance after 24 hours. °· You feel light-headed. °· You keep feeling nauseous or you keep vomiting. °· You develop a rash. °· You have a fever. °· You have redness or swelling around the IV site. °Get help right away if: °· You have trouble breathing. °· You have new-onset confusion at home. °Summary °· After the procedure, it is common to feel sleepy, have impaired judgment, or feel nauseous if you eat too soon. °· Rest after you get home. Know the things you should not do after the procedure. °· Follow the diet recommended by your health care provider and drink enough   fluid to keep your urine pale yellow. °· Get help right away if you have trouble breathing or new-onset confusion at home. °This information is not intended to replace advice given to you by your health care provider. Make sure you discuss any questions you have with your health care provider. °Document Revised: 04/15/2020 Document Reviewed: 11/12/2019 °Elsevier Patient Education © 2021 [employer] ° ° °

## 2021-03-07 NOTE — Procedures (Signed)
Interventional Radiology Procedure Note  Procedure: Placement of a right IJ approach single lumen PowerPort.  Tip is positioned at the superior cavoatrial junction and catheter is ready for immediate use.  Complications: No immediate Recommendations:  - Ok to shower tomorrow - Do not submerge for 7 days - Routine line care   Signed,  Charle Clear K. Blair Mesina, MD   

## 2021-03-08 ENCOUNTER — Encounter: Payer: Self-pay | Admitting: Oncology

## 2021-03-08 ENCOUNTER — Inpatient Hospital Stay: Payer: Medicare HMO

## 2021-03-08 NOTE — Progress Notes (Signed)
Met with patient at registration to introduce myself as Financial Resource Specialist and to offer available resources.  Discussed one-time $1000 [employer] grant and qualifications to assist with personal expenses while going through treatment.  Gave him my card if interested in applying and for any additional financial questions or concerns.  

## 2021-03-09 ENCOUNTER — Other Ambulatory Visit: Payer: Self-pay | Admitting: Neurology

## 2021-03-10 ENCOUNTER — Other Ambulatory Visit: Payer: Self-pay | Admitting: Podiatry

## 2021-03-10 DIAGNOSIS — M79609 Pain in unspecified limb: Secondary | ICD-10-CM | POA: Diagnosis not present

## 2021-03-10 DIAGNOSIS — B351 Tinea unguium: Secondary | ICD-10-CM | POA: Diagnosis not present

## 2021-03-11 LAB — HEPATIC FUNCTION PANEL
ALT: 16 IU/L (ref 0–44)
AST: 24 IU/L (ref 0–40)
Albumin: 4.4 g/dL (ref 3.7–4.7)
Alkaline Phosphatase: 72 IU/L (ref 44–121)
Bilirubin Total: 0.4 mg/dL (ref 0.0–1.2)
Bilirubin, Direct: <0.1 mg/dL (ref 0.00–0.40)
Total Protein: 7.2 g/dL (ref 6.0–8.5)

## 2021-03-13 ENCOUNTER — Other Ambulatory Visit: Payer: Self-pay

## 2021-03-13 ENCOUNTER — Inpatient Hospital Stay: Payer: Medicare HMO

## 2021-03-13 DIAGNOSIS — C670 Malignant neoplasm of trigone of bladder: Secondary | ICD-10-CM

## 2021-03-13 DIAGNOSIS — Z452 Encounter for adjustment and management of vascular access device: Secondary | ICD-10-CM | POA: Diagnosis not present

## 2021-03-13 DIAGNOSIS — Z95828 Presence of other vascular implants and grafts: Secondary | ICD-10-CM

## 2021-03-13 DIAGNOSIS — C679 Malignant neoplasm of bladder, unspecified: Secondary | ICD-10-CM | POA: Diagnosis not present

## 2021-03-13 DIAGNOSIS — Z5111 Encounter for antineoplastic chemotherapy: Secondary | ICD-10-CM | POA: Diagnosis not present

## 2021-03-13 LAB — CBC WITH DIFFERENTIAL (CANCER CENTER ONLY)
Abs Immature Granulocytes: 0.04 10*3/uL (ref 0.00–0.07)
Basophils Absolute: 0.1 10*3/uL (ref 0.0–0.1)
Basophils Relative: 1 %
Eosinophils Absolute: 0.1 10*3/uL (ref 0.0–0.5)
Eosinophils Relative: 2 %
HCT: 43 % (ref 39.0–52.0)
Hemoglobin: 14.1 g/dL (ref 13.0–17.0)
Immature Granulocytes: 1 %
Lymphocytes Relative: 31 %
Lymphs Abs: 2.1 10*3/uL (ref 0.7–4.0)
MCH: 30.7 pg (ref 26.0–34.0)
MCHC: 32.8 g/dL (ref 30.0–36.0)
MCV: 93.5 fL (ref 80.0–100.0)
Monocytes Absolute: 0.7 10*3/uL (ref 0.1–1.0)
Monocytes Relative: 10 %
Neutro Abs: 3.6 10*3/uL (ref 1.7–7.7)
Neutrophils Relative %: 55 %
Platelet Count: 212 10*3/uL (ref 150–400)
RBC: 4.6 MIL/uL (ref 4.22–5.81)
RDW: 13 % (ref 11.5–15.5)
WBC Count: 6.6 10*3/uL (ref 4.0–10.5)
nRBC: 0 % (ref 0.0–0.2)

## 2021-03-13 LAB — CMP (CANCER CENTER ONLY)
ALT: 17 U/L (ref 0–44)
AST: 22 U/L (ref 15–41)
Albumin: 3.9 g/dL (ref 3.5–5.0)
Alkaline Phosphatase: 84 U/L (ref 38–126)
Anion gap: 5 (ref 5–15)
BUN: 9 mg/dL (ref 8–23)
CO2: 28 mmol/L (ref 22–32)
Calcium: 9.4 mg/dL (ref 8.9–10.3)
Chloride: 103 mmol/L (ref 98–111)
Creatinine: 0.82 mg/dL (ref 0.61–1.24)
GFR, Estimated: >60 mL/min (ref >60)
Glucose, Bld: 109 mg/dL — ABNORMAL HIGH (ref 70–99)
Potassium: 4.1 mmol/L (ref 3.5–5.1)
Sodium: 136 mmol/L (ref 135–145)
Total Bilirubin: 0.3 mg/dL (ref 0.3–1.2)
Total Protein: 7.4 g/dL (ref 6.5–8.1)

## 2021-03-13 MED ORDER — HEPARIN SOD (PORK) LOCK FLUSH 100 UNIT/ML IV SOLN
500.0000 [IU] | Freq: Once | INTRAVENOUS | Status: AC
  Administered 2021-03-13: 500 [IU] via INTRAVENOUS
  Filled 2021-03-13: qty 5

## 2021-03-13 MED ORDER — SODIUM CHLORIDE 0.9% FLUSH
10.0000 mL | INTRAVENOUS | Status: DC | PRN
  Administered 2021-03-13: 10 mL via INTRAVENOUS
  Filled 2021-03-13: qty 10

## 2021-03-13 NOTE — Patient Instructions (Signed)

## 2021-03-14 ENCOUNTER — Inpatient Hospital Stay: Payer: Medicare HMO

## 2021-03-14 ENCOUNTER — Other Ambulatory Visit: Payer: Medicare HMO

## 2021-03-14 ENCOUNTER — Other Ambulatory Visit: Payer: Self-pay

## 2021-03-14 VITALS — BP 142/82 | HR 55 | Temp 97.8°F | Resp 16

## 2021-03-14 DIAGNOSIS — Z452 Encounter for adjustment and management of vascular access device: Secondary | ICD-10-CM | POA: Diagnosis not present

## 2021-03-14 DIAGNOSIS — C679 Malignant neoplasm of bladder, unspecified: Secondary | ICD-10-CM

## 2021-03-14 DIAGNOSIS — Z5111 Encounter for antineoplastic chemotherapy: Secondary | ICD-10-CM | POA: Diagnosis not present

## 2021-03-14 MED ORDER — PALONOSETRON HCL INJECTION 0.25 MG/5ML
INTRAVENOUS | Status: AC
  Filled 2021-03-14: qty 5

## 2021-03-14 MED ORDER — PALONOSETRON HCL INJECTION 0.25 MG/5ML
0.2500 mg | Freq: Once | INTRAVENOUS | Status: AC
  Administered 2021-03-14: 0.25 mg via INTRAVENOUS

## 2021-03-14 MED ORDER — HEPARIN SOD (PORK) LOCK FLUSH 100 UNIT/ML IV SOLN
500.0000 [IU] | Freq: Once | INTRAVENOUS | Status: AC | PRN
  Administered 2021-03-14: 500 [IU]
  Filled 2021-03-14: qty 5

## 2021-03-14 MED ORDER — SODIUM CHLORIDE 0.9 % IV SOLN
1000.0000 mg/m2 | Freq: Once | INTRAVENOUS | Status: AC
  Administered 2021-03-14: 1976 mg via INTRAVENOUS
  Filled 2021-03-14: qty 51.97

## 2021-03-14 MED ORDER — SODIUM CHLORIDE 0.9 % IV SOLN
Freq: Once | INTRAVENOUS | Status: AC
  Filled 2021-03-14: qty 250

## 2021-03-14 MED ORDER — POTASSIUM CHLORIDE IN NACL 20-0.9 MEQ/L-% IV SOLN
Freq: Once | INTRAVENOUS | Status: AC
  Filled 2021-03-14: qty 1000

## 2021-03-14 MED ORDER — SODIUM CHLORIDE 0.9 % IV SOLN: Freq: Once | INTRAVENOUS | Status: DC

## 2021-03-14 MED ORDER — SODIUM CHLORIDE 0.9% FLUSH
10.0000 mL | INTRAVENOUS | Status: DC | PRN
  Administered 2021-03-14: 10 mL
  Filled 2021-03-14: qty 10

## 2021-03-14 MED ORDER — MAGNESIUM SULFATE 2 GM/50ML IV SOLN
INTRAVENOUS | Status: AC
  Filled 2021-03-14: qty 50

## 2021-03-14 MED ORDER — SODIUM CHLORIDE 0.9 % IV SOLN
150.0000 mg | Freq: Once | INTRAVENOUS | Status: AC
  Administered 2021-03-14: 150 mg via INTRAVENOUS
  Filled 2021-03-14: qty 150

## 2021-03-14 MED ORDER — SODIUM CHLORIDE 0.9 % IV SOLN
51.0000 mg/m2 | Freq: Once | INTRAVENOUS | Status: AC
  Administered 2021-03-14: 100 mg via INTRAVENOUS
  Filled 2021-03-14: qty 100

## 2021-03-14 MED ORDER — MAGNESIUM SULFATE 2 GM/50ML IV SOLN
2.0000 g | Freq: Once | INTRAVENOUS | Status: AC
  Administered 2021-03-14: 2 g via INTRAVENOUS

## 2021-03-14 MED ORDER — SODIUM CHLORIDE 0.9 % IV SOLN
10.0000 mg | Freq: Once | INTRAVENOUS | Status: AC
  Administered 2021-03-14: 10 mg via INTRAVENOUS
  Filled 2021-03-14: qty 10

## 2021-03-14 NOTE — Patient Instructions (Signed)
[facility] Discharge Instructions for Patients Receiving Chemotherapy  Today you received the following chemotherapy agents gemzar, cisplatin  To help prevent nausea and vomiting after your treatment, we encourage you to take your nausea medication as directed.   If you develop nausea and vomiting that is not controlled by your nausea medication, call the clinic.   BELOW ARE SYMPTOMS THAT SHOULD BE REPORTED IMMEDIATELY:  *FEVER GREATER THAN 100.5 F  *CHILLS WITH OR WITHOUT FEVER  NAUSEA AND VOMITING THAT IS NOT CONTROLLED WITH YOUR NAUSEA MEDICATION  *UNUSUAL SHORTNESS OF BREATH  *UNUSUAL BRUISING OR BLEEDING  TENDERNESS IN MOUTH AND THROAT WITH OR WITHOUT PRESENCE OF ULCERS  *URINARY PROBLEMS  *BOWEL PROBLEMS  UNUSUAL RASH Items with * indicate a potential emergency and should be followed up as soon as possible.  Feel free to call the clinic should you have any questions or concerns. The clinic phone number is [phone].  Please show the CHEMO ALERT CARD at check-in to the Emergency Department and triage nurse.   

## 2021-03-15 ENCOUNTER — Telehealth: Payer: Self-pay | Admitting: Family Medicine

## 2021-03-15 NOTE — Telephone Encounter (Signed)
Patient is calling and would like for Dr. Ardelia Mems to call him to discuss restarting medication for his copd. I informed the patient that he would need an appointment but he does not want to schedule.   He asked that she please call to discuss. The best call back is [phone].

## 2021-03-16 MED ORDER — SPIRIVA HANDIHALER 18 MCG IN CAPS: 18.0000 ug | ORAL_CAPSULE | Freq: Every day | RESPIRATORY_TRACT | 4 refills | Status: DC

## 2021-03-16 NOTE — Telephone Encounter (Signed)
Returned call to patient. He reports dyspnea on exertion ongoing ever since he stopped his COPD inhalers some time ago. Denies wheezing. Just wants to restart his inhaler.  Recently starting chemo and he is tolerating it well.  Patient sounded comfortable on phone. As dyspnea is ongoing and chronic, less concerned for malignancy-related PE but if dyspnea worsens would certainly want to consider workup for PE.  Will rx spiriva. He will let me know how he is doing with it.  [name], MD

## 2021-03-16 NOTE — Addendum Note (Signed)
Addended by: [name] on: 03/16/2021 02:49 PM   Modules accepted: Orders

## 2021-03-20 ENCOUNTER — Other Ambulatory Visit: Payer: Medicare HMO

## 2021-03-21 ENCOUNTER — Inpatient Hospital Stay: Payer: Medicare HMO

## 2021-03-21 ENCOUNTER — Other Ambulatory Visit: Payer: Self-pay

## 2021-03-21 ENCOUNTER — Inpatient Hospital Stay: Payer: Medicare HMO | Admitting: Oncology

## 2021-03-21 VITALS — BP 150/71 | HR 69 | Temp 96.2°F | Resp 16 | Ht 67.25 in | Wt 176.2 lb

## 2021-03-21 DIAGNOSIS — C679 Malignant neoplasm of bladder, unspecified: Secondary | ICD-10-CM

## 2021-03-21 DIAGNOSIS — Z452 Encounter for adjustment and management of vascular access device: Secondary | ICD-10-CM | POA: Diagnosis not present

## 2021-03-21 DIAGNOSIS — C670 Malignant neoplasm of trigone of bladder: Secondary | ICD-10-CM

## 2021-03-21 DIAGNOSIS — Z5111 Encounter for antineoplastic chemotherapy: Secondary | ICD-10-CM | POA: Diagnosis not present

## 2021-03-21 LAB — CBC WITH DIFFERENTIAL (CANCER CENTER ONLY)
Abs Immature Granulocytes: 0.05 10*3/uL (ref 0.00–0.07)
Basophils Absolute: 0 10*3/uL (ref 0.0–0.1)
Basophils Relative: 1 %
Eosinophils Absolute: 0.1 10*3/uL (ref 0.0–0.5)
Eosinophils Relative: 1 %
HCT: 39.5 % (ref 39.0–52.0)
Hemoglobin: 13 g/dL (ref 13.0–17.0)
Immature Granulocytes: 1 %
Lymphocytes Relative: 59 %
Lymphs Abs: 2.2 10*3/uL (ref 0.7–4.0)
MCH: 30.6 pg (ref 26.0–34.0)
MCHC: 32.9 g/dL (ref 30.0–36.0)
MCV: 92.9 fL (ref 80.0–100.0)
Monocytes Absolute: 0.1 10*3/uL (ref 0.1–1.0)
Monocytes Relative: 4 %
Neutro Abs: 1.3 10*3/uL — ABNORMAL LOW (ref 1.7–7.7)
Neutrophils Relative %: 34 %
Platelet Count: 153 10*3/uL (ref 150–400)
RBC: 4.25 MIL/uL (ref 4.22–5.81)
RDW: 12.5 % (ref 11.5–15.5)
WBC Count: 3.7 10*3/uL — ABNORMAL LOW (ref 4.0–10.5)
nRBC: 0 % (ref 0.0–0.2)

## 2021-03-21 LAB — CMP (CANCER CENTER ONLY)
ALT: 29 U/L (ref 0–44)
AST: 30 U/L (ref 15–41)
Albumin: 3.8 g/dL (ref 3.5–5.0)
Alkaline Phosphatase: 67 U/L (ref 38–126)
Anion gap: 10 (ref 5–15)
BUN: 12 mg/dL (ref 8–23)
CO2: 26 mmol/L (ref 22–32)
Calcium: 9 mg/dL (ref 8.9–10.3)
Chloride: 104 mmol/L (ref 98–111)
Creatinine: 0.82 mg/dL (ref 0.61–1.24)
GFR, Estimated: >60 mL/min (ref >60)
Glucose, Bld: 83 mg/dL (ref 70–99)
Potassium: 4.2 mmol/L (ref 3.5–5.1)
Sodium: 140 mmol/L (ref 135–145)
Total Bilirubin: 0.5 mg/dL (ref 0.3–1.2)
Total Protein: 7.2 g/dL (ref 6.5–8.1)

## 2021-03-21 MED ORDER — HEPARIN SOD (PORK) LOCK FLUSH 100 UNIT/ML IV SOLN
500.0000 [IU] | Freq: Once | INTRAVENOUS | Status: AC | PRN
  Administered 2021-03-21: 500 [IU]
  Filled 2021-03-21: qty 5

## 2021-03-21 MED ORDER — SODIUM CHLORIDE 0.9 % IV SOLN
Freq: Once | INTRAVENOUS | Status: AC
  Filled 2021-03-21: qty 250

## 2021-03-21 MED ORDER — SODIUM CHLORIDE 0.9% FLUSH
10.0000 mL | INTRAVENOUS | Status: DC | PRN
  Administered 2021-03-21: 10 mL
  Filled 2021-03-21: qty 10

## 2021-03-21 MED ORDER — PROCHLORPERAZINE MALEATE 10 MG PO TABS
10.0000 mg | ORAL_TABLET | Freq: Once | ORAL | Status: AC
  Administered 2021-03-21: 10 mg via ORAL

## 2021-03-21 MED ORDER — SODIUM CHLORIDE 0.9 % IV SOLN
1000.0000 mg/m2 | Freq: Once | INTRAVENOUS | Status: AC
  Administered 2021-03-21: 1976 mg via INTRAVENOUS
  Filled 2021-03-21: qty 51.97

## 2021-03-21 NOTE — Progress Notes (Signed)
Hematology and Oncology Follow Up Visit  William [name] [id number] [birth year] 80 y.o. 03/21/2021 11:20 AM William [name], MDMcIntyre, [name], MD   Principle Diagnosis: 80 year old man with bladder cancer diagnosed in February 2022.  He was found to have T2N0 high-grade urothelial carcinoma with 20% squamous differentiation.   Prior Therapy:  He is status post TURBT completed on February 08, 2021 which confirmed the diagnosis.  Current therapy: Chemotherapy with gemcitabine and cisplatin started on March 14, 2021.  Today is day 8 of cycle 1 with planned 4 cycles.  Interim History: William [name] returns today for a follow-up visit.  Since the last visit, he has started cycle 1 of chemotherapy without any major complications.  He denies any nausea vomiting or diarrhea.  He denies any excessive fatigue or tiredness.  Performance status quality of life remained excellent.  He denies any worsening neuropathy or decline in energy.  He is able to work in the yard yesterday without any decline.     Medications: I have reviewed the patient's current medications.  Current Outpatient Medications  Medication Sig Dispense Refill  . b complex vitamins tablet Take 1 tablet by mouth daily.    . fluticasone (FLONASE) 50 MCG/ACT nasal spray Place 2 sprays into both nostrils daily. 16 g 6  . gabapentin (NEURONTIN) 300 MG capsule TAKE 3 CAPSULES BY MOUTH AT BEDTIME 540 capsule 3  . Iron-Vitamins (GERITOL) LIQD Take 5 mLs by mouth.    . lidocaine-prilocaine (EMLA) cream Apply 1 application topically as needed. 30 g 0  . Multiple Vitamin (MULTIVITAMIN WITH MINERALS) TABS tablet Take 1 tablet by mouth daily.    . naproxen (NAPROSYN) 500 MG tablet Take 1 tablet (500 mg total) by mouth 2 (two) times daily as needed. 30 tablet 0  . prochlorperazine (COMPAZINE) 10 MG tablet Take 1 tablet (10 mg total) by mouth every 6 (six) hours as needed for nausea or vomiting. 30 tablet 0  . senna-docusate (SENOKOT-S)  8.6-50 MG tablet Take 1 tablet by mouth 2 (two) times daily. While taking stronger pain meds to prevent constipation. 10 tablet 0  . tamsulosin (FLOMAX) 0.4 MG CAPS capsule Take 1 capsule (0.4 mg total) by mouth daily as needed. For urinary urgeny / weak stream after tumor resection 30 capsule 1  . terbinafine (LAMISIL) 250 MG tablet Take 1 tablet (250 mg total) by mouth daily. (Patient taking differently: Take 250 mg by mouth at bedtime.) 90 tablet 0  . tiotropium (SPIRIVA HANDIHALER) 18 MCG inhalation capsule Place 1 capsule (18 mcg total) into inhaler and inhale daily. 30 capsule 4  . tiZANidine (ZANAFLEX) 4 MG tablet Take 4 mg by mouth at bedtime.    . traMADol (ULTRAM) 50 MG tablet Take 1 tablet (50 mg total) by mouth every 6 (six) hours as needed for moderate pain. Post-operatively 15 tablet 0   No current facility-administered medications for this visit.     Allergies:  Allergies  Allergen Reactions  . Hydrocodone Other (See Comments)    Causes constipation even with stool softner      Physical Exam: Blood pressure (!) 150/71, pulse 69, temperature (!) 96.2 F (35.7 C), temperature source Tympanic, resp. rate 16, height 5' 7.25" (1.708 m), weight 176 lb 3.2 oz (79.9 kg), SpO2 97 %.   ECOG: 0   General appearance: Comfortable appearing without any discomfort Head: Normocephalic without any trauma Oropharynx: Mucous membranes are moist and pink without any thrush or ulcers. Eyes: Pupils are equal and round reactive  to light. Lymph nodes: No cervical, supraclavicular, inguinal or axillary lymphadenopathy.   Heart:regular rate and rhythm.  S1 and S2 without leg edema. Lung: Clear without any rhonchi or wheezes.  No dullness to percussion. Abdomin: Soft, nontender, nondistended with good bowel sounds.  No hepatosplenomegaly. Musculoskeletal: No joint deformity or effusion.  Full range of motion noted. Neurological: No deficits noted on motor, sensory and deep tendon reflex  exam. Skin: No petechial rash or dryness.  Appeared moist.      Lab Results: Lab Results  Component Value Date   WBC 6.6 03/13/2021   HGB 14.1 03/13/2021   HCT 43.0 03/13/2021   MCV 93.5 03/13/2021   PLT 212 03/13/2021     Chemistry      Component Value Date/Time   NA 136 03/13/2021 1435   NA 137 09/21/2020 0942   K 4.1 03/13/2021 1435   CL 103 03/13/2021 1435   CO2 28 03/13/2021 1435   BUN 9 03/13/2021 1435   BUN 5 (L) 09/21/2020 0942   CREATININE 0.82 03/13/2021 1435   CREATININE 0.90 02/07/2017 1032      Component Value Date/Time   CALCIUM 9.4 03/13/2021 1435   ALKPHOS 84 03/13/2021 1435   AST 22 03/13/2021 1435   ALT 17 03/13/2021 1435   BILITOT 0.3 03/13/2021 1435       Impression and Plan:    80 year old man with:  1.    T2N0 high-grade urothelial carcinoma of the bladder with 20% squamous differentiation diagnosed in February 2022.    He is currently receiving chemotherapy utilizing gemcitabine and cisplatin as a neoadjuvant approach in preparation for possible radical cystectomy.  Risks and benefits of continuing this treatment were discussed at this time.  Complications include nausea, fatigue, myelosuppression, neutropenia and possible sepsis.  He is agreeable to proceed.  Laboratory data reviewed today and showed borderline neutropenia which we will monitor.  Growth factor support may be needed for subsequent cycles of therapy.    2. IV access:Port-A-Cath inserted without any complications.  This will continue to be used for the time being.  3. Antiemetics: No nausea or vomiting reported at this time.  Compazine is available to him.  4. Renal function surveillance:His creatinine clearance continues to be normal at this time.  We will monitor this on platinum therapy.  5. Goals of care:his disease is incurable and aggressive measures are warranted.  6. Follow-up: Will be in 2 weeks the start of cycle 2.  30  minutes were spent on  this encounter.  The time was dedicated to reviewing his disease status, discussing treatment options and complications noted therapy.  Zola Button, MD 3/22/202211:20 AM

## 2021-03-21 NOTE — Patient Instructions (Signed)
[facility] °Discharge Instructions for Patients Receiving Chemotherapy ° °Today you received the following chemotherapy agents Gemzar ° °To help prevent nausea and vomiting after your treatment, we encourage you to take your nausea medication as directed. °  °If you develop nausea and vomiting that is not controlled by your nausea medication, call the clinic.  ° °BELOW ARE SYMPTOMS THAT SHOULD BE REPORTED IMMEDIATELY: °· *FEVER GREATER THAN 100.5 F °· *CHILLS WITH OR WITHOUT FEVER °· NAUSEA AND VOMITING THAT IS NOT CONTROLLED WITH YOUR NAUSEA MEDICATION °· *UNUSUAL SHORTNESS OF BREATH °· *UNUSUAL BRUISING OR BLEEDING °· TENDERNESS IN MOUTH AND THROAT WITH OR WITHOUT PRESENCE OF ULCERS °· *URINARY PROBLEMS °· *BOWEL PROBLEMS °· UNUSUAL RASH °Items with * indicate a potential emergency and should be followed up as soon as possible. ° °Feel free to call the clinic should you have any questions or concerns. The clinic phone number is [phone]. ° °Please show the CHEMO ALERT CARD at check-in to the Emergency Department and triage nurse. ° ° °

## 2021-03-22 ENCOUNTER — Telehealth: Payer: Self-pay | Admitting: Oncology

## 2021-03-22 NOTE — Telephone Encounter (Signed)
Release: [id number] Faxed medical records to [facility] @ [phone]

## 2021-04-04 ENCOUNTER — Inpatient Hospital Stay: Payer: Medicare HMO | Admitting: Oncology

## 2021-04-04 ENCOUNTER — Inpatient Hospital Stay: Payer: Medicare HMO

## 2021-04-04 ENCOUNTER — Inpatient Hospital Stay: Payer: Medicare HMO | Attending: Oncology

## 2021-04-04 ENCOUNTER — Other Ambulatory Visit: Payer: Self-pay

## 2021-04-04 VITALS — BP 108/63 | HR 65 | Temp 95.5°F | Resp 18 | Wt 178.5 lb

## 2021-04-04 DIAGNOSIS — Z5111 Encounter for antineoplastic chemotherapy: Secondary | ICD-10-CM | POA: Insufficient documentation

## 2021-04-04 DIAGNOSIS — C679 Malignant neoplasm of bladder, unspecified: Secondary | ICD-10-CM | POA: Insufficient documentation

## 2021-04-04 DIAGNOSIS — Z95828 Presence of other vascular implants and grafts: Secondary | ICD-10-CM | POA: Insufficient documentation

## 2021-04-04 DIAGNOSIS — Z5189 Encounter for other specified aftercare: Secondary | ICD-10-CM | POA: Insufficient documentation

## 2021-04-04 DIAGNOSIS — C670 Malignant neoplasm of trigone of bladder: Secondary | ICD-10-CM

## 2021-04-04 LAB — CMP (CANCER CENTER ONLY)
ALT: 20 U/L (ref 0–44)
AST: 31 U/L (ref 15–41)
Albumin: 3.6 g/dL (ref 3.5–5.0)
Alkaline Phosphatase: 58 U/L (ref 38–126)
Anion gap: 12 (ref 5–15)
BUN: 13 mg/dL (ref 8–23)
CO2: 23 mmol/L (ref 22–32)
Calcium: 8.5 mg/dL — ABNORMAL LOW (ref 8.9–10.3)
Chloride: 106 mmol/L (ref 98–111)
Creatinine: 0.83 mg/dL (ref 0.61–1.24)
GFR, Estimated: >60 mL/min (ref >60)
Glucose, Bld: 123 mg/dL — ABNORMAL HIGH (ref 70–99)
Potassium: 3.7 mmol/L (ref 3.5–5.1)
Sodium: 141 mmol/L (ref 135–145)
Total Bilirubin: 0.4 mg/dL (ref 0.3–1.2)
Total Protein: 6.6 g/dL (ref 6.5–8.1)

## 2021-04-04 LAB — CBC WITH DIFFERENTIAL (CANCER CENTER ONLY)
Abs Immature Granulocytes: 0.02 10*3/uL (ref 0.00–0.07)
Basophils Absolute: 0 10*3/uL (ref 0.0–0.1)
Basophils Relative: 0 %
Eosinophils Absolute: 0.1 10*3/uL (ref 0.0–0.5)
Eosinophils Relative: 1 %
HCT: 36.9 % — ABNORMAL LOW (ref 39.0–52.0)
Hemoglobin: 11.7 g/dL — ABNORMAL LOW (ref 13.0–17.0)
Immature Granulocytes: 1 %
Lymphocytes Relative: 50 %
Lymphs Abs: 1.7 10*3/uL (ref 0.7–4.0)
MCH: 30 pg (ref 26.0–34.0)
MCHC: 31.7 g/dL (ref 30.0–36.0)
MCV: 94.6 fL (ref 80.0–100.0)
Monocytes Absolute: 0.7 10*3/uL (ref 0.1–1.0)
Monocytes Relative: 19 %
Neutro Abs: 1 10*3/uL — ABNORMAL LOW (ref 1.7–7.7)
Neutrophils Relative %: 29 %
Platelet Count: 406 10*3/uL — ABNORMAL HIGH (ref 150–400)
RBC: 3.9 MIL/uL — ABNORMAL LOW (ref 4.22–5.81)
RDW: 13.8 % (ref 11.5–15.5)
WBC Count: 3.5 10*3/uL — ABNORMAL LOW (ref 4.0–10.5)
nRBC: 0 % (ref 0.0–0.2)

## 2021-04-04 LAB — MAGNESIUM: Magnesium: 1.8 mg/dL (ref 1.7–2.4)

## 2021-04-04 MED ORDER — SODIUM CHLORIDE 0.9 % IV SOLN: Freq: Once | INTRAVENOUS | Status: DC

## 2021-04-04 MED ORDER — PALONOSETRON HCL INJECTION 0.25 MG/5ML
INTRAVENOUS | Status: AC
  Filled 2021-04-04: qty 5

## 2021-04-04 MED ORDER — SODIUM CHLORIDE 0.9% FLUSH
10.0000 mL | INTRAVENOUS | Status: DC | PRN
  Administered 2021-04-04: 10 mL
  Filled 2021-04-04: qty 10

## 2021-04-04 MED ORDER — SODIUM CHLORIDE 0.9 % IV SOLN
Freq: Once | INTRAVENOUS | Status: AC
  Filled 2021-04-04: qty 250

## 2021-04-04 MED ORDER — MAGNESIUM SULFATE 2 GM/50ML IV SOLN
2.0000 g | Freq: Once | INTRAVENOUS | Status: AC
Start: 2021-04-04 — End: 2021-04-04
  Administered 2021-04-04: 2 g via INTRAVENOUS

## 2021-04-04 MED ORDER — SODIUM CHLORIDE 0.9 % IV SOLN
51.0000 mg/m2 | Freq: Once | INTRAVENOUS | Status: AC
  Administered 2021-04-04: 100 mg via INTRAVENOUS
  Filled 2021-04-04: qty 100

## 2021-04-04 MED ORDER — SODIUM CHLORIDE 0.9 % IV SOLN
800.0000 mg/m2 | Freq: Once | INTRAVENOUS | Status: AC
  Administered 2021-04-04: 1558 mg via INTRAVENOUS
  Filled 2021-04-04: qty 40.98

## 2021-04-04 MED ORDER — HEPARIN SOD (PORK) LOCK FLUSH 100 UNIT/ML IV SOLN
500.0000 [IU] | Freq: Once | INTRAVENOUS | Status: AC | PRN
  Administered 2021-04-04: 500 [IU]
  Filled 2021-04-04: qty 5

## 2021-04-04 MED ORDER — SODIUM CHLORIDE 0.9% FLUSH
10.0000 mL | Freq: Once | INTRAVENOUS | Status: AC
  Administered 2021-04-04: 10 mL
  Filled 2021-04-04: qty 10

## 2021-04-04 MED ORDER — POTASSIUM CHLORIDE IN NACL 20-0.9 MEQ/L-% IV SOLN
Freq: Once | INTRAVENOUS | Status: AC
  Filled 2021-04-04: qty 1000

## 2021-04-04 MED ORDER — SODIUM CHLORIDE 0.9 % IV SOLN
150.0000 mg | Freq: Once | INTRAVENOUS | Status: AC
  Administered 2021-04-04: 150 mg via INTRAVENOUS
  Filled 2021-04-04: qty 150

## 2021-04-04 MED ORDER — MAGNESIUM SULFATE 2 GM/50ML IV SOLN
INTRAVENOUS | Status: AC
  Filled 2021-04-04: qty 50

## 2021-04-04 MED ORDER — PALONOSETRON HCL INJECTION 0.25 MG/5ML
0.2500 mg | Freq: Once | INTRAVENOUS | Status: AC
  Administered 2021-04-04: 0.25 mg via INTRAVENOUS

## 2021-04-04 MED ORDER — SODIUM CHLORIDE 0.9 % IV SOLN
Freq: Once | INTRAVENOUS | Status: AC
Start: 2021-04-04 — End: 2021-04-04
  Filled 2021-04-04: qty 250

## 2021-04-04 MED ORDER — SODIUM CHLORIDE 0.9 % IV SOLN
10.0000 mg | Freq: Once | INTRAVENOUS | Status: AC
  Administered 2021-04-04: 10 mg via INTRAVENOUS
  Filled 2021-04-04: qty 10

## 2021-04-04 NOTE — Progress Notes (Signed)
Per Dr. Alen Blew, ok for treatment today with ANC 1.0 and no magnesium level.

## 2021-04-04 NOTE — Progress Notes (Signed)
Hematology and Oncology Follow Up Visit  William [name] [id number] [birth year] 80 y.o. 04/04/2021 8:14 AM [name], MDMcIntyre, [name], MD   Principle Diagnosis: 80 year old man with T2N0 high-grade urothelial carcinoma with 20% squamous differentiation of the bladder diagnosed in February 2022.  Prior Therapy:  He is status post TURBT completed on February 08, 2021 which confirmed the diagnosis.  Current therapy: Neoadjuvant chemotherapy with gemcitabine and cisplatin started on March 14, 2021.   He is here for day 1 of cycle 2 of therapy.  Interim History: William [name] presents today for repeat evaluation.  Since the last visit, he reports no major changes in his health.  He continues to tolerate chemotherapy without any new complaints.  He denies any nausea, vomiting or abdominal pain.  He denies any worsening neuropathy or changes in his bowels.  He denies any hematuria dysuria.  As performance status quality of life remain excellent.     Medications: Updated on review. Current Outpatient Medications  Medication Sig Dispense Refill  . b complex vitamins tablet Take 1 tablet by mouth daily.    . fluticasone (FLONASE) 50 MCG/ACT nasal spray Place 2 sprays into both nostrils daily. 16 g 6  . gabapentin (NEURONTIN) 300 MG capsule TAKE 3 CAPSULES BY MOUTH AT BEDTIME 540 capsule 3  . Iron-Vitamins (GERITOL) LIQD Take 5 mLs by mouth.    . lidocaine-prilocaine (EMLA) cream Apply 1 application topically as needed. 30 g 0  . Multiple Vitamin (MULTIVITAMIN WITH MINERALS) TABS tablet Take 1 tablet by mouth daily.    . naproxen (NAPROSYN) 500 MG tablet Take 1 tablet (500 mg total) by mouth 2 (two) times daily as needed. (Patient not taking: Reported on 03/21/2021) 30 tablet 0  . prochlorperazine (COMPAZINE) 10 MG tablet Take 1 tablet (10 mg total) by mouth every 6 (six) hours as needed for nausea or vomiting. 30 tablet 0  . senna-docusate (SENOKOT-S) 8.6-50 MG tablet Take 1 tablet by  mouth 2 (two) times daily. While taking stronger pain meds to prevent constipation. (Patient not taking: Reported on 03/21/2021) 10 tablet 0  . tamsulosin (FLOMAX) 0.4 MG CAPS capsule Take 1 capsule (0.4 mg total) by mouth daily as needed. For urinary urgeny / weak stream after tumor resection 30 capsule 1  . terbinafine (LAMISIL) 250 MG tablet Take 1 tablet (250 mg total) by mouth daily. (Patient taking differently: Take 250 mg by mouth at bedtime.) 90 tablet 0  . tiotropium (SPIRIVA HANDIHALER) 18 MCG inhalation capsule Place 1 capsule (18 mcg total) into inhaler and inhale daily. 30 capsule 4  . tiZANidine (ZANAFLEX) 4 MG tablet Take 4 mg by mouth at bedtime.    . traMADol (ULTRAM) 50 MG tablet Take 1 tablet (50 mg total) by mouth every 6 (six) hours as needed for moderate pain. Post-operatively 15 tablet 0   No current facility-administered medications for this visit.     Allergies:  Allergies  Allergen Reactions  . Hydrocodone Other (See Comments)    Causes constipation even with stool softner      Physical Exam: Blood pressure 108/63, pulse 65, temperature (!) 95.5 F (35.3 C), temperature source Tympanic, resp. rate 18, weight 178 lb 8 oz (81 kg), SpO2 96 %.    ECOG: 0    General appearance: Alert, awake without any distress. Head: Atraumatic without abnormalities Oropharynx: Without any thrush or ulcers. Eyes: No scleral icterus. Lymph nodes: No lymphadenopathy noted in the cervical, supraclavicular, or axillary nodes Heart:regular rate and rhythm, without  any murmurs or gallops.   Lung: Clear to auscultation without any rhonchi, wheezes or dullness to percussion. Abdomin: Soft, nontender without any shifting dullness or ascites. Musculoskeletal: No clubbing or cyanosis. Neurological: No motor or sensory deficits. Skin: No rashes or lesions.       Lab Results: Lab Results  Component Value Date   WBC 3.7 (L) 03/21/2021   HGB 13.0 03/21/2021   HCT 39.5  03/21/2021   MCV 92.9 03/21/2021   PLT 153 03/21/2021     Chemistry      Component Value Date/Time   NA 140 03/21/2021 1140   NA 137 09/21/2020 0942   K 4.2 03/21/2021 1140   CL 104 03/21/2021 1140   CO2 26 03/21/2021 1140   BUN 12 03/21/2021 1140   BUN 5 (L) 09/21/2020 0942   CREATININE 0.82 03/21/2021 1140   CREATININE 0.90 02/07/2017 1032      Component Value Date/Time   CALCIUM 9.0 03/21/2021 1140   ALKPHOS 67 03/21/2021 1140   AST 30 03/21/2021 1140   ALT 29 03/21/2021 1140   BILITOT 0.5 03/21/2021 1140       Impression and Plan:    80 year old man with:  1.    Bladder cancer diagnosed in February 2022.  He was found to have T2N0 high-grade urothelial carcinoma of the bladder with 20% squamous differentiation.  His disease status was updated at this time and treatment options were reviewed.  Risks and benefits of continuing neoadjuvant chemotherapy to be followed by radical cystectomy were discussed.  Complications with nausea, vomiting, mild suppression, neutropenia, neuropathy among others were reviewed.  After discussion today he is agreeable to proceed.    2. IV access:Port-A-Cath remains in use without any issues.  3. Antiemetics: Compazine is available to him without any nausea or vomiting.  4. Renal function surveillance:No decline in his kidney function on platinum therapy.  We will continue to monitor.  5. Goals of care:Therapy remains curative and aggressive measures are warranted.  6.  Neutropenia: Related to the chemotherapy and would be at risk for developing neutropenic fever and possible sepsis.  Will make adjustment to gemcitabine dosing and we will start growth factor support on day 10 of each cycle of therapy.  Complication associated with the last time including pathologist, myalgias on others were discussed.  7. Follow-up: He will return in 1 week for completion of cycle 2 and 3 weeks for the start of cycle 3.  30  minutes  were dedicated to this visit.  The time was spent on reviewing laboratory data, disease status update, treatment options and complications noted to therapy.  Zola Button, MD 4/5/20228:14 AM

## 2021-04-04 NOTE — Patient Instructions (Signed)
[facility] Discharge Instructions for Patients Receiving Chemotherapy  Today you received the following chemotherapy agents: Gemcitabine (Gemzar) and Cisplatin.  To help prevent nausea and vomiting after your treatment, we encourage you to take your nausea medication as directed by your MD.   If you develop nausea and vomiting that is not controlled by your nausea medication, call the clinic.   BELOW ARE SYMPTOMS THAT SHOULD BE REPORTED IMMEDIATELY:  *FEVER GREATER THAN 100.5 F  *CHILLS WITH OR WITHOUT FEVER  NAUSEA AND VOMITING THAT IS NOT CONTROLLED WITH YOUR NAUSEA MEDICATION  *UNUSUAL SHORTNESS OF BREATH  *UNUSUAL BRUISING OR BLEEDING  TENDERNESS IN MOUTH AND THROAT WITH OR WITHOUT PRESENCE OF ULCERS  *URINARY PROBLEMS  *BOWEL PROBLEMS  UNUSUAL RASH Items with * indicate a potential emergency and should be followed up as soon as possible.  Feel free to call the clinic should you have any questions or concerns. The clinic phone number is [phone].  Please show the [place] at check-in to the Emergency Department and triage nurse.

## 2021-04-05 ENCOUNTER — Telehealth: Payer: Self-pay | Admitting: Oncology

## 2021-04-05 NOTE — Telephone Encounter (Signed)
Scheduled appt per 4/5 sch msg. Called pt no answer. Left msg with appt date and time.

## 2021-04-11 ENCOUNTER — Other Ambulatory Visit: Payer: Self-pay

## 2021-04-11 ENCOUNTER — Inpatient Hospital Stay: Payer: Medicare HMO

## 2021-04-11 VITALS — BP 132/76 | HR 63 | Temp 97.8°F | Resp 20 | Ht 67.25 in | Wt 176.0 lb

## 2021-04-11 DIAGNOSIS — Z95828 Presence of other vascular implants and grafts: Secondary | ICD-10-CM

## 2021-04-11 DIAGNOSIS — C670 Malignant neoplasm of trigone of bladder: Secondary | ICD-10-CM

## 2021-04-11 DIAGNOSIS — C679 Malignant neoplasm of bladder, unspecified: Secondary | ICD-10-CM | POA: Diagnosis not present

## 2021-04-11 DIAGNOSIS — Z5111 Encounter for antineoplastic chemotherapy: Secondary | ICD-10-CM | POA: Diagnosis not present

## 2021-04-11 DIAGNOSIS — Z5189 Encounter for other specified aftercare: Secondary | ICD-10-CM | POA: Diagnosis not present

## 2021-04-11 LAB — CBC WITH DIFFERENTIAL (CANCER CENTER ONLY)
Abs Immature Granulocytes: 0.24 10*3/uL — ABNORMAL HIGH (ref 0.00–0.07)
Basophils Absolute: 0.1 10*3/uL (ref 0.0–0.1)
Basophils Relative: 3 %
Eosinophils Absolute: 0 10*3/uL (ref 0.0–0.5)
Eosinophils Relative: 0 %
HCT: 36.1 % — ABNORMAL LOW (ref 39.0–52.0)
Hemoglobin: 11.8 g/dL — ABNORMAL LOW (ref 13.0–17.0)
Immature Granulocytes: 6 %
Lymphocytes Relative: 46 %
Lymphs Abs: 1.9 10*3/uL (ref 0.7–4.0)
MCH: 30.5 pg (ref 26.0–34.0)
MCHC: 32.7 g/dL (ref 30.0–36.0)
MCV: 93.3 fL (ref 80.0–100.0)
Monocytes Absolute: 0.3 10*3/uL (ref 0.1–1.0)
Monocytes Relative: 8 %
Neutro Abs: 1.5 10*3/uL — ABNORMAL LOW (ref 1.7–7.7)
Neutrophils Relative %: 37 %
Platelet Count: 313 10*3/uL (ref 150–400)
RBC: 3.87 MIL/uL — ABNORMAL LOW (ref 4.22–5.81)
RDW: 13.6 % (ref 11.5–15.5)
WBC Count: 4.1 10*3/uL (ref 4.0–10.5)
nRBC: 0 % (ref 0.0–0.2)

## 2021-04-11 LAB — CMP (CANCER CENTER ONLY)
ALT: 38 U/L (ref 0–44)
AST: 30 U/L (ref 15–41)
Albumin: 3.7 g/dL (ref 3.5–5.0)
Alkaline Phosphatase: 66 U/L (ref 38–126)
Anion gap: 9 (ref 5–15)
BUN: 11 mg/dL (ref 8–23)
CO2: 25 mmol/L (ref 22–32)
Calcium: 8.6 mg/dL — ABNORMAL LOW (ref 8.9–10.3)
Chloride: 104 mmol/L (ref 98–111)
Creatinine: 0.75 mg/dL (ref 0.61–1.24)
GFR, Estimated: >60 mL/min (ref >60)
Glucose, Bld: 114 mg/dL — ABNORMAL HIGH (ref 70–99)
Potassium: 3.9 mmol/L (ref 3.5–5.1)
Sodium: 138 mmol/L (ref 135–145)
Total Bilirubin: 0.4 mg/dL (ref 0.3–1.2)
Total Protein: 6.8 g/dL (ref 6.5–8.1)

## 2021-04-11 MED ORDER — PROCHLORPERAZINE MALEATE 10 MG PO TABS
10.0000 mg | ORAL_TABLET | Freq: Once | ORAL | Status: AC
  Administered 2021-04-11: 10 mg via ORAL

## 2021-04-11 MED ORDER — HEPARIN SOD (PORK) LOCK FLUSH 100 UNIT/ML IV SOLN
500.0000 [IU] | Freq: Once | INTRAVENOUS | Status: AC | PRN
  Administered 2021-04-11: 500 [IU]
  Filled 2021-04-11: qty 5

## 2021-04-11 MED ORDER — SODIUM CHLORIDE 0.9% FLUSH
10.0000 mL | INTRAVENOUS | Status: DC | PRN
  Administered 2021-04-11: 10 mL
  Filled 2021-04-11: qty 10

## 2021-04-11 MED ORDER — SODIUM CHLORIDE 0.9 % IV SOLN
800.0000 mg/m2 | Freq: Once | INTRAVENOUS | Status: AC
  Administered 2021-04-11: 1558 mg via INTRAVENOUS
  Filled 2021-04-11: qty 40.98

## 2021-04-11 MED ORDER — SODIUM CHLORIDE 0.9% FLUSH
10.0000 mL | Freq: Once | INTRAVENOUS | Status: AC
  Administered 2021-04-11: 10 mL
  Filled 2021-04-11: qty 10

## 2021-04-11 MED ORDER — SODIUM CHLORIDE 0.9 % IV SOLN
Freq: Once | INTRAVENOUS | Status: AC
  Filled 2021-04-11: qty 250

## 2021-04-11 MED ORDER — PROCHLORPERAZINE MALEATE 10 MG PO TABS
ORAL_TABLET | ORAL | Status: AC
  Filled 2021-04-11: qty 1

## 2021-04-11 NOTE — Patient Instructions (Signed)
[facility] Discharge Instructions for Patients Receiving Chemotherapy  Today you received the following chemotherapy agents: Gemcitabine (Gemzar) and Cisplatin.  To help prevent nausea and vomiting after your treatment, we encourage you to take your nausea medication as directed by your MD.   If you develop nausea and vomiting that is not controlled by your nausea medication, call the clinic.   BELOW ARE SYMPTOMS THAT SHOULD BE REPORTED IMMEDIATELY:  *FEVER GREATER THAN 100.5 F  *CHILLS WITH OR WITHOUT FEVER  NAUSEA AND VOMITING THAT IS NOT CONTROLLED WITH YOUR NAUSEA MEDICATION  *UNUSUAL SHORTNESS OF BREATH  *UNUSUAL BRUISING OR BLEEDING  TENDERNESS IN MOUTH AND THROAT WITH OR WITHOUT PRESENCE OF ULCERS  *URINARY PROBLEMS  *BOWEL PROBLEMS  UNUSUAL RASH Items with * indicate a potential emergency and should be followed up as soon as possible.  Feel free to call the clinic should you have any questions or concerns. The clinic phone number is [phone].  Please show the [place] at check-in to the Emergency Department and triage nurse.

## 2021-04-12 ENCOUNTER — Inpatient Hospital Stay: Payer: Medicare HMO

## 2021-04-12 VITALS — BP 134/69 | HR 82 | Temp 98.2°F | Resp 18

## 2021-04-12 DIAGNOSIS — C679 Malignant neoplasm of bladder, unspecified: Secondary | ICD-10-CM | POA: Diagnosis not present

## 2021-04-12 DIAGNOSIS — Z5111 Encounter for antineoplastic chemotherapy: Secondary | ICD-10-CM | POA: Diagnosis not present

## 2021-04-12 DIAGNOSIS — Z5189 Encounter for other specified aftercare: Secondary | ICD-10-CM | POA: Diagnosis not present

## 2021-04-12 MED ORDER — PEGFILGRASTIM-JMDB 6 MG/0.6ML ~~LOC~~ SOSY
6.0000 mg | PREFILLED_SYRINGE | Freq: Once | SUBCUTANEOUS | Status: AC
Start: 2021-04-12 — End: 2021-04-12
  Administered 2021-04-12: 6 mg via SUBCUTANEOUS

## 2021-04-12 NOTE — Patient Instructions (Signed)
Pegfilgrastim injection What is this medicine? PEGFILGRASTIM (PEG fil gra stim) is a long-acting granulocyte colony-stimulating factor that stimulates the growth of neutrophils, a type of white blood cell important in the body's fight against infection. It is used to reduce the incidence of fever and infection in patients with certain types of cancer who are receiving chemotherapy that affects the bone marrow, and to increase survival after being exposed to high doses of radiation. This medicine may be used for other purposes; ask your health care provider or pharmacist if you have questions. COMMON BRAND NAME(S): Fulphila, Neulasta, Nyvepria, UDENYCA, Ziextenzo What should I tell my health care provider before I take this medicine? They need to know if you have any of these conditions:  kidney disease  latex allergy  ongoing radiation therapy  sickle cell disease  skin reactions to acrylic adhesives (On-Body Injector only)  an unusual or allergic reaction to pegfilgrastim, filgrastim, other medicines, foods, dyes, or preservatives  pregnant or trying to get pregnant  breast-feeding How should I use this medicine? This medicine is for injection under the skin. If you get this medicine at home, you will be taught how to prepare and give the pre-filled syringe or how to use the On-body Injector. Refer to the patient Instructions for Use for detailed instructions. Use exactly as directed. Tell your healthcare provider immediately if you suspect that the On-body Injector may not have performed as intended or if you suspect the use of the On-body Injector resulted in a missed or partial dose. It is important that you put your used needles and syringes in a special sharps container. Do not put them in a trash can. If you do not have a sharps container, call your pharmacist or healthcare provider to get one. Talk to your pediatrician regarding the use of this medicine in children. While this drug  may be prescribed for selected conditions, precautions do apply. Overdosage: If you think you have taken too much of this medicine contact a poison control center or emergency room at once. NOTE: This medicine is only for you. Do not share this medicine with others. What if I miss a dose? It is important not to miss your dose. Call your doctor or health care professional if you miss your dose. If you miss a dose due to an On-body Injector failure or leakage, a new dose should be administered as soon as possible using a single prefilled syringe for manual use. What may interact with this medicine? Interactions have not been studied. This list may not describe all possible interactions. Give your health care provider a list of all the medicines, herbs, non-prescription drugs, or dietary supplements you use. Also tell them if you smoke, drink alcohol, or use illegal drugs. Some items may interact with your medicine. What should I watch for while using this medicine? Your condition will be monitored carefully while you are receiving this medicine. You may need blood work done while you are taking this medicine. Talk to your health care provider about your risk of cancer. You may be more at risk for certain types of cancer if you take this medicine. If you are going to need a MRI, CT scan, or other procedure, tell your doctor that you are using this medicine (On-Body Injector only). What side effects may I notice from receiving this medicine? Side effects that you should report to your doctor or health care professional as soon as possible:  allergic reactions (skin rash, itching or hives, swelling of   the face, lips, or tongue)  back pain  dizziness  fever  pain, redness, or irritation at site where injected  pinpoint red spots on the skin  red or dark-brown urine  shortness of breath or breathing problems  stomach or side pain, or pain at the shoulder  swelling  tiredness  trouble  passing urine or change in the amount of urine  unusual bruising or bleeding Side effects that usually do not require medical attention (report to your doctor or health care professional if they continue or are bothersome):  bone pain  muscle pain This list may not describe all possible side effects. Call your doctor for medical advice about side effects. You may report side effects to FDA at 1-800-FDA-1088. Where should I keep my medicine? Keep out of the reach of children. If you are using this medicine at home, you will be instructed on how to store it. Throw away any unused medicine after the expiration date on the label. NOTE: This sheet is a summary. It may not cover all possible information. If you have questions about this medicine, talk to your doctor, pharmacist, or health care provider.  2021 Elsevier/Gold Standard (2020-01-08 13:20:51)  

## 2021-04-13 ENCOUNTER — Inpatient Hospital Stay: Payer: Medicare HMO

## 2021-04-18 DIAGNOSIS — C670 Malignant neoplasm of trigone of bladder: Secondary | ICD-10-CM | POA: Diagnosis not present

## 2021-04-25 ENCOUNTER — Inpatient Hospital Stay: Payer: Medicare HMO | Admitting: Oncology

## 2021-04-25 ENCOUNTER — Other Ambulatory Visit: Payer: Self-pay

## 2021-04-25 ENCOUNTER — Other Ambulatory Visit: Payer: Medicare HMO

## 2021-04-25 ENCOUNTER — Inpatient Hospital Stay: Payer: Medicare HMO

## 2021-04-25 ENCOUNTER — Telehealth: Payer: Self-pay | Admitting: *Deleted

## 2021-04-25 VITALS — BP 123/80 | HR 71 | Temp 98.2°F | Resp 18 | Wt 172.7 lb

## 2021-04-25 DIAGNOSIS — C670 Malignant neoplasm of trigone of bladder: Secondary | ICD-10-CM

## 2021-04-25 DIAGNOSIS — Z95828 Presence of other vascular implants and grafts: Secondary | ICD-10-CM

## 2021-04-25 DIAGNOSIS — Z5111 Encounter for antineoplastic chemotherapy: Secondary | ICD-10-CM | POA: Diagnosis not present

## 2021-04-25 DIAGNOSIS — Z5189 Encounter for other specified aftercare: Secondary | ICD-10-CM | POA: Diagnosis not present

## 2021-04-25 DIAGNOSIS — C679 Malignant neoplasm of bladder, unspecified: Secondary | ICD-10-CM | POA: Diagnosis not present

## 2021-04-25 LAB — CMP (CANCER CENTER ONLY)
ALT: 13 U/L (ref 0–44)
AST: 21 U/L (ref 15–41)
Albumin: 3.5 g/dL (ref 3.5–5.0)
Alkaline Phosphatase: 88 U/L (ref 38–126)
Anion gap: 11 (ref 5–15)
BUN: 8 mg/dL (ref 8–23)
CO2: 24 mmol/L (ref 22–32)
Calcium: 8.9 mg/dL (ref 8.9–10.3)
Chloride: 104 mmol/L (ref 98–111)
Creatinine: 0.74 mg/dL (ref 0.61–1.24)
GFR, Estimated: >60 mL/min (ref >60)
Glucose, Bld: 109 mg/dL — ABNORMAL HIGH (ref 70–99)
Potassium: 4 mmol/L (ref 3.5–5.1)
Sodium: 139 mmol/L (ref 135–145)
Total Bilirubin: 0.5 mg/dL (ref 0.3–1.2)
Total Protein: 7 g/dL (ref 6.5–8.1)

## 2021-04-25 LAB — CBC WITH DIFFERENTIAL (CANCER CENTER ONLY)
Abs Immature Granulocytes: 0.15 10*3/uL — ABNORMAL HIGH (ref 0.00–0.07)
Basophils Absolute: 0.1 10*3/uL (ref 0.0–0.1)
Basophils Relative: 1 %
Eosinophils Absolute: 0.1 10*3/uL (ref 0.0–0.5)
Eosinophils Relative: 1 %
HCT: 36.8 % — ABNORMAL LOW (ref 39.0–52.0)
Hemoglobin: 12 g/dL — ABNORMAL LOW (ref 13.0–17.0)
Immature Granulocytes: 1 %
Lymphocytes Relative: 16 %
Lymphs Abs: 1.7 10*3/uL (ref 0.7–4.0)
MCH: 31.3 pg (ref 26.0–34.0)
MCHC: 32.6 g/dL (ref 30.0–36.0)
MCV: 95.8 fL (ref 80.0–100.0)
Monocytes Absolute: 1.1 10*3/uL — ABNORMAL HIGH (ref 0.1–1.0)
Monocytes Relative: 10 %
Neutro Abs: 7.9 10*3/uL — ABNORMAL HIGH (ref 1.7–7.7)
Neutrophils Relative %: 71 %
Platelet Count: 312 10*3/uL (ref 150–400)
RBC: 3.84 MIL/uL — ABNORMAL LOW (ref 4.22–5.81)
RDW: 15.5 % (ref 11.5–15.5)
WBC Count: 11 10*3/uL — ABNORMAL HIGH (ref 4.0–10.5)
nRBC: 0 % (ref 0.0–0.2)

## 2021-04-25 MED ORDER — SODIUM CHLORIDE 0.9% FLUSH
10.0000 mL | Freq: Once | INTRAVENOUS | Status: AC
  Administered 2021-04-25: 10 mL
  Filled 2021-04-25: qty 10

## 2021-04-25 MED ORDER — HEPARIN SOD (PORK) LOCK FLUSH 100 UNIT/ML IV SOLN
500.0000 [IU] | Freq: Once | INTRAVENOUS | Status: AC
  Administered 2021-04-25: 500 [IU]
  Filled 2021-04-25: qty 5

## 2021-04-25 NOTE — Telephone Encounter (Signed)
PC to patient informing him of infusion appointment tomorrow 04/26/21 at 7:30.  No answer, voicemail left.

## 2021-04-25 NOTE — Progress Notes (Signed)
Hematology and Oncology Follow Up Visit  William [name] [id number] [birth year] 80 y.o. 04/25/2021 8:04 AM [name], MDMcIntyre, [name], MD   Principle Diagnosis: 80 year old man with bladder cancer diagnosed in February 2022.  He was found to have T2N0 high-grade urothelial carcinoma with 20% squamous differentiation. Prior Therapy:  He is status post TURBT completed on February 08, 2021 which confirmed the diagnosis.  Current therapy: Neoadjuvant chemotherapy with gemcitabine and cisplatin started on March 14, 2021.   He is here for day 1 of cycle 3 of therapy.  Interim History: William [name] returns today for a follow-up visit.  Since the last visit, he reports no major changes in his health.  He continues to tolerate chemotherapy without any issues.  He denies any nausea, vomiting or abdominal pain.  He denies any worsening neuropathy.     Medications: Unchanged on review. Current Outpatient Medications  Medication Sig Dispense Refill  . b complex vitamins tablet Take 1 tablet by mouth daily.    . fluticasone (FLONASE) 50 MCG/ACT nasal spray Place 2 sprays into both nostrils daily. 16 g 6  . gabapentin (NEURONTIN) 300 MG capsule TAKE 3 CAPSULES BY MOUTH AT BEDTIME 540 capsule 3  . Iron-Vitamins (GERITOL) LIQD Take 5 mLs by mouth.    . lidocaine-prilocaine (EMLA) cream Apply 1 application topically as needed. 30 g 0  . Multiple Vitamin (MULTIVITAMIN WITH MINERALS) TABS tablet Take 1 tablet by mouth daily.    . naproxen (NAPROSYN) 500 MG tablet Take 1 tablet (500 mg total) by mouth 2 (two) times daily as needed. (Patient not taking: Reported on 03/21/2021) 30 tablet 0  . prochlorperazine (COMPAZINE) 10 MG tablet Take 1 tablet (10 mg total) by mouth every 6 (six) hours as needed for nausea or vomiting. 30 tablet 0  . senna-docusate (SENOKOT-S) 8.6-50 MG tablet Take 1 tablet by mouth 2 (two) times daily. While taking stronger pain meds to prevent constipation. (Patient not  taking: Reported on 03/21/2021) 10 tablet 0  . tamsulosin (FLOMAX) 0.4 MG CAPS capsule Take 1 capsule (0.4 mg total) by mouth daily as needed. For urinary urgeny / weak stream after tumor resection 30 capsule 1  . terbinafine (LAMISIL) 250 MG tablet Take 1 tablet (250 mg total) by mouth daily. (Patient taking differently: Take 250 mg by mouth at bedtime.) 90 tablet 0  . tiotropium (SPIRIVA HANDIHALER) 18 MCG inhalation capsule Place 1 capsule (18 mcg total) into inhaler and inhale daily. 30 capsule 4  . tiZANidine (ZANAFLEX) 4 MG tablet Take 4 mg by mouth at bedtime.    . traMADol (ULTRAM) 50 MG tablet Take 1 tablet (50 mg total) by mouth every 6 (six) hours as needed for moderate pain. Post-operatively 15 tablet 0   No current facility-administered medications for this visit.     Allergies:  Allergies  Allergen Reactions  . Hydrocodone Other (See Comments)    Causes constipation even with stool softner      Physical Exam:  Blood pressure 123/80, pulse 71, temperature 98.2 F (36.8 C), temperature source Tympanic, resp. rate 18, weight 172 lb 11.2 oz (78.3 kg), SpO2 98 %.    ECOG: 0    General appearance: Comfortable appearing without any discomfort Head: Normocephalic without any trauma Oropharynx: Mucous membranes are moist and pink without any thrush or ulcers. Eyes: Pupils are equal and round reactive to light. Lymph nodes: No cervical, supraclavicular, inguinal or axillary lymphadenopathy.   Heart:regular rate and rhythm.  S1 and S2 without leg  edema. Lung: Clear without any rhonchi or wheezes.  No dullness to percussion. Abdomin: Soft, nontender, nondistended with good bowel sounds.  No hepatosplenomegaly. Musculoskeletal: No joint deformity or effusion.  Full range of motion noted. Neurological: No deficits noted on motor, sensory and deep tendon reflex exam. Skin: No petechial rash or dryness.  Appeared moist.         Lab Results: Lab Results  Component Value  Date   WBC 4.1 04/11/2021   HGB 11.8 (L) 04/11/2021   HCT 36.1 (L) 04/11/2021   MCV 93.3 04/11/2021   PLT 313 04/11/2021     Chemistry      Component Value Date/Time   NA 138 04/11/2021 0926   NA 137 09/21/2020 0942   K 3.9 04/11/2021 0926   CL 104 04/11/2021 0926   CO2 25 04/11/2021 0926   BUN 11 04/11/2021 0926   BUN 5 (L) 09/21/2020 0942   CREATININE 0.75 04/11/2021 0926   CREATININE 0.90 02/07/2017 1032      Component Value Date/Time   CALCIUM 8.6 (L) 04/11/2021 0926   ALKPHOS 66 04/11/2021 0926   AST 30 04/11/2021 0926   ALT 38 04/11/2021 0926   BILITOT 0.4 04/11/2021 0926       Impression and Plan:    80 year old man with:  1.    T2N0 high-grade urothelial carcinoma of the bladder with 20% squamous differentiation diagnosed in February 2022.  He is currently receiving neoadjuvant chemotherapy without any major complications.  Risks and benefits of proceeding with cycle 3 out of 4 planned treatment was reviewed.  He is to include nausea, vomiting, myelosuppression and renal dysfunction.  The plan is to proceed with cystectomy upon completing neoadjuvant chemotherapy.  The start of cycle 3 chemotherapy is to be given this week.  2. IV access:Port-A-Cath continues to be in use without any issues.  3. Antiemetics: No nausea or vomiting reported at this time.  Compazine is available to him.   4. Renal function surveillance:His kidney function remains stable with creatinine clearance above 6 this is a minute.  5. Goals of care:His disease is curable and aggressive measures are warranted.  6.  Neutropenia: We will continue to receive growth factor support on day 10 of each cycle of therapy.  White cell count appears adequate time.  7. Follow-up: In 1 week to complete cycle 3 in 3 weeks for the start of cycle 4.  30  minutes were dedicated to this visit.  The time was spent on reviewing laboratory data, disease status update, treatment options and  complications noted to therapy.  Zola Button, MD 4/26/20228:04 AM

## 2021-04-26 ENCOUNTER — Inpatient Hospital Stay: Payer: Medicare HMO

## 2021-04-26 VITALS — BP 128/70 | HR 74 | Temp 98.3°F | Resp 18

## 2021-04-26 DIAGNOSIS — Z5189 Encounter for other specified aftercare: Secondary | ICD-10-CM | POA: Diagnosis not present

## 2021-04-26 DIAGNOSIS — C679 Malignant neoplasm of bladder, unspecified: Secondary | ICD-10-CM

## 2021-04-26 DIAGNOSIS — Z5111 Encounter for antineoplastic chemotherapy: Secondary | ICD-10-CM | POA: Diagnosis not present

## 2021-04-26 MED ORDER — PALONOSETRON HCL INJECTION 0.25 MG/5ML
0.2500 mg | Freq: Once | INTRAVENOUS | Status: AC
  Administered 2021-04-26: 0.25 mg via INTRAVENOUS

## 2021-04-26 MED ORDER — DEXAMETHASONE SODIUM PHOSPHATE 100 MG/10ML IJ SOLN
10.0000 mg | Freq: Once | INTRAMUSCULAR | Status: AC
  Administered 2021-04-26: 10 mg via INTRAVENOUS
  Filled 2021-04-26: qty 10

## 2021-04-26 MED ORDER — CISPLATIN CHEMO INJECTION 100MG/100ML
51.0000 mg/m2 | Freq: Once | INTRAVENOUS | Status: AC
  Administered 2021-04-26: 100 mg via INTRAVENOUS
  Filled 2021-04-26: qty 100

## 2021-04-26 MED ORDER — PALONOSETRON HCL INJECTION 0.25 MG/5ML
INTRAVENOUS | Status: AC
  Filled 2021-04-26: qty 5

## 2021-04-26 MED ORDER — MAGNESIUM SULFATE 2 GM/50ML IV SOLN
INTRAVENOUS | Status: AC
  Filled 2021-04-26: qty 50

## 2021-04-26 MED ORDER — SODIUM CHLORIDE 0.9 % IV SOLN
Freq: Once | INTRAVENOUS | Status: DC
  Filled 2021-04-26: qty 250

## 2021-04-26 MED ORDER — SODIUM CHLORIDE 0.9 % IV SOLN
800.0000 mg/m2 | Freq: Once | INTRAVENOUS | Status: AC
  Administered 2021-04-26: 1558 mg via INTRAVENOUS
  Filled 2021-04-26: qty 40.98

## 2021-04-26 MED ORDER — SODIUM CHLORIDE 0.9 % IV SOLN
Freq: Once | INTRAVENOUS | Status: AC
Start: 2021-04-26 — End: 2021-04-26
  Filled 2021-04-26: qty 250

## 2021-04-26 MED ORDER — MAGNESIUM SULFATE 2 GM/50ML IV SOLN
2.0000 g | Freq: Once | INTRAVENOUS | Status: AC
  Administered 2021-04-26: 2 g via INTRAVENOUS

## 2021-04-26 MED ORDER — POTASSIUM CHLORIDE IN NACL 20-0.9 MEQ/L-% IV SOLN
Freq: Once | INTRAVENOUS | Status: AC
Start: 2021-04-26 — End: 2021-04-26
  Filled 2021-04-26: qty 1000

## 2021-04-26 MED ORDER — SODIUM CHLORIDE 0.9 % IV SOLN: Freq: Once | INTRAVENOUS | Status: DC

## 2021-04-26 MED ORDER — HEPARIN SOD (PORK) LOCK FLUSH 100 UNIT/ML IV SOLN
500.0000 [IU] | Freq: Once | INTRAVENOUS | Status: AC | PRN
  Administered 2021-04-26: 500 [IU]
  Filled 2021-04-26: qty 5

## 2021-04-26 MED ORDER — SODIUM CHLORIDE 0.9% FLUSH
10.0000 mL | INTRAVENOUS | Status: DC | PRN
  Administered 2021-04-26: 10 mL
  Filled 2021-04-26: qty 10

## 2021-04-26 MED ORDER — SODIUM CHLORIDE 0.9 % IV SOLN
150.0000 mg | Freq: Once | INTRAVENOUS | Status: AC
  Administered 2021-04-26: 150 mg via INTRAVENOUS
  Filled 2021-04-26: qty 150

## 2021-04-26 NOTE — Patient Instructions (Signed)
[facility] MEDICAL [facility]COLOG[facility]  Discharge Instructions: Thank you for choosing [facility] to provide your oncology and hematology care.   If you have a lab appointment with the Cancer Center, please go directly to the Cancer Center and check in at the registration area.   Wear comfortable clothing and clothing appropriate for easy access to any Portacath or PICC line.   We strive to give you quality time with your provider. You may need to reschedule your appointment if you arrive late (15 or more minutes).  Arriving late affects you and other patients whose appointments are after yours.  Also, if you miss three or more appointments without notifying the office, you may be dismissed from the clinic at the provider's discretion.      For prescription refill requests, have your pharmacy contact our office and allow 72 hours for refills to be completed.    Today you received the following chemotherapy and/or immunotherapy agents gemzar and cisplatin      To help prevent nausea and vomiting after your treatment, we encourage you to take your nausea medication as directed.  BELOW ARE SYMPTOMS THAT SHOULD BE REPORTED IMMEDIATELY: *FEVER GREATER THAN 100.4 F (38 C) OR HIGHER *CHILLS OR SWEATING *NAUSEA AND VOMITING THAT IS NOT CONTROLLED WITH YOUR NAUSEA MEDICATION *UNUSUAL SHORTNESS OF BREATH *UNUSUAL BRUISING OR BLEEDING *URINARY PROBLEMS (pain or burning when urinating, or frequent urination) *BOWEL PROBLEMS (unusual diarrhea, constipation, pain near the anus) TENDERNESS IN MOUTH AND THROAT WITH OR WITHOUT PRESENCE OF ULCERS (sore throat, sores in mouth, or a toothache) UNUSUAL RASH, SWELLING OR PAIN  UNUSUAL VAGINAL DISCHARGE OR ITCHING   Items with * indicate a potential emergency and should be followed up as soon as possible or go to the Emergency Department if any problems should occur.  Please show the CHEMOTHERAPY ALERT CARD or IMMUNOTHERAPY ALERT CARD at  check-in to the Emergency Department and triage nurse.  Should you have questions after your visit or need to cancel or reschedule your appointment, please contact [facility]  Dept: [phone]  and follow the prompts.  Office hours are 8:00 a.m. to 4:30 p.m. Monday - Friday. Please note that voicemails left after 4:00 p.m. may not be returned until the following business day.  We are closed weekends and major holidays. You have access to a nurse at all times for urgent questions. Please call the main number to the clinic Dept: [phone] and follow the prompts.   For any non-urgent questions, you may also contact your provider using MyChart. We now offer e-Visits for anyone 18 and older to request care online for non-urgent symptoms. For details visit [email or web address].   Also download the MyChart app! Go to the app store, search "MyChart", open the app, select [facility], and log in with your MyChart username and password.  Due to Covid, a mask is required upon entering the hospital/clinic. If you do not have a mask, one will be given to you upon arrival. For doctor visits, patients may have 1 support person aged 18 or older with them. For treatment visits, patients cannot have anyone with them due to current Covid guidelines and our immunocompromised population.   

## 2021-05-02 ENCOUNTER — Other Ambulatory Visit: Payer: Self-pay

## 2021-05-02 ENCOUNTER — Inpatient Hospital Stay: Payer: Medicare HMO | Attending: Oncology

## 2021-05-02 ENCOUNTER — Other Ambulatory Visit: Payer: Medicare HMO

## 2021-05-02 ENCOUNTER — Inpatient Hospital Stay: Payer: Medicare HMO

## 2021-05-02 VITALS — BP 145/85 | HR 78 | Temp 97.7°F | Resp 18 | Wt 169.8 lb

## 2021-05-02 DIAGNOSIS — C670 Malignant neoplasm of trigone of bladder: Secondary | ICD-10-CM

## 2021-05-02 DIAGNOSIS — Z5111 Encounter for antineoplastic chemotherapy: Secondary | ICD-10-CM | POA: Diagnosis not present

## 2021-05-02 DIAGNOSIS — Z5189 Encounter for other specified aftercare: Secondary | ICD-10-CM | POA: Diagnosis not present

## 2021-05-02 DIAGNOSIS — C679 Malignant neoplasm of bladder, unspecified: Secondary | ICD-10-CM

## 2021-05-02 DIAGNOSIS — Z95828 Presence of other vascular implants and grafts: Secondary | ICD-10-CM

## 2021-05-02 LAB — CMP (CANCER CENTER ONLY)
ALT: 14 U/L (ref 0–44)
AST: 25 U/L (ref 15–41)
Albumin: 3.7 g/dL (ref 3.5–5.0)
Alkaline Phosphatase: 73 U/L (ref 38–126)
Anion gap: 9 (ref 5–15)
BUN: 18 mg/dL (ref 8–23)
CO2: 28 mmol/L (ref 22–32)
Calcium: 9.1 mg/dL (ref 8.9–10.3)
Chloride: 102 mmol/L (ref 98–111)
Creatinine: 0.86 mg/dL (ref 0.61–1.24)
GFR, Estimated: >60 mL/min (ref >60)
Glucose, Bld: 111 mg/dL — ABNORMAL HIGH (ref 70–99)
Potassium: 3.9 mmol/L (ref 3.5–5.1)
Sodium: 139 mmol/L (ref 135–145)
Total Bilirubin: 0.9 mg/dL (ref 0.3–1.2)
Total Protein: 7.3 g/dL (ref 6.5–8.1)

## 2021-05-02 LAB — CBC WITH DIFFERENTIAL (CANCER CENTER ONLY)
Abs Immature Granulocytes: 0.02 10*3/uL (ref 0.00–0.07)
Basophils Absolute: 0 10*3/uL (ref 0.0–0.1)
Basophils Relative: 1 %
Eosinophils Absolute: 0 10*3/uL (ref 0.0–0.5)
Eosinophils Relative: 1 %
HCT: 34.3 % — ABNORMAL LOW (ref 39.0–52.0)
Hemoglobin: 11.3 g/dL — ABNORMAL LOW (ref 13.0–17.0)
Immature Granulocytes: 1 %
Lymphocytes Relative: 37 %
Lymphs Abs: 1.3 10*3/uL (ref 0.7–4.0)
MCH: 31.2 pg (ref 26.0–34.0)
MCHC: 32.9 g/dL (ref 30.0–36.0)
MCV: 94.8 fL (ref 80.0–100.0)
Monocytes Absolute: 0.2 10*3/uL (ref 0.1–1.0)
Monocytes Relative: 6 %
Neutro Abs: 1.9 10*3/uL (ref 1.7–7.7)
Neutrophils Relative %: 54 %
Platelet Count: 311 10*3/uL (ref 150–400)
RBC: 3.62 MIL/uL — ABNORMAL LOW (ref 4.22–5.81)
RDW: 14.8 % (ref 11.5–15.5)
WBC Count: 3.4 10*3/uL — ABNORMAL LOW (ref 4.0–10.5)
nRBC: 0 % (ref 0.0–0.2)

## 2021-05-02 LAB — MAGNESIUM: Magnesium: 1.8 mg/dL (ref 1.7–2.4)

## 2021-05-02 MED ORDER — HEPARIN SOD (PORK) LOCK FLUSH 100 UNIT/ML IV SOLN
500.0000 [IU] | Freq: Once | INTRAVENOUS | Status: AC | PRN
  Administered 2021-05-02: 500 [IU]
  Filled 2021-05-02: qty 5

## 2021-05-02 MED ORDER — PROCHLORPERAZINE MALEATE 10 MG PO TABS
ORAL_TABLET | ORAL | Status: AC
  Filled 2021-05-02: qty 1

## 2021-05-02 MED ORDER — SODIUM CHLORIDE 0.9% FLUSH
10.0000 mL | Freq: Once | INTRAVENOUS | Status: AC
  Administered 2021-05-02: 10 mL
  Filled 2021-05-02: qty 10

## 2021-05-02 MED ORDER — SODIUM CHLORIDE 0.9 % IV SOLN
Freq: Once | INTRAVENOUS | Status: DC
  Filled 2021-05-02: qty 250

## 2021-05-02 MED ORDER — SODIUM CHLORIDE 0.9 % IV SOLN
Freq: Once | INTRAVENOUS | Status: AC
  Filled 2021-05-02: qty 250

## 2021-05-02 MED ORDER — SODIUM CHLORIDE 0.9 % IV SOLN
800.0000 mg/m2 | Freq: Once | INTRAVENOUS | Status: AC
  Administered 2021-05-02: 1558 mg via INTRAVENOUS
  Filled 2021-05-02: qty 40.98

## 2021-05-02 MED ORDER — SODIUM CHLORIDE 0.9% FLUSH
10.0000 mL | INTRAVENOUS | Status: DC | PRN
  Administered 2021-05-02: 10 mL
  Filled 2021-05-02: qty 10

## 2021-05-02 MED ORDER — PROCHLORPERAZINE MALEATE 10 MG PO TABS
10.0000 mg | ORAL_TABLET | Freq: Once | ORAL | Status: AC
  Administered 2021-05-02: 10 mg via ORAL

## 2021-05-04 ENCOUNTER — Inpatient Hospital Stay: Payer: Medicare HMO

## 2021-05-04 ENCOUNTER — Other Ambulatory Visit: Payer: Self-pay

## 2021-05-04 VITALS — BP 145/82 | HR 80 | Temp 97.9°F | Resp 16

## 2021-05-04 DIAGNOSIS — Z5189 Encounter for other specified aftercare: Secondary | ICD-10-CM | POA: Diagnosis not present

## 2021-05-04 DIAGNOSIS — C679 Malignant neoplasm of bladder, unspecified: Secondary | ICD-10-CM

## 2021-05-04 DIAGNOSIS — Z5111 Encounter for antineoplastic chemotherapy: Secondary | ICD-10-CM | POA: Diagnosis not present

## 2021-05-04 MED ORDER — PEGFILGRASTIM-JMDB 6 MG/0.6ML ~~LOC~~ SOSY
PREFILLED_SYRINGE | SUBCUTANEOUS | Status: AC
  Filled 2021-05-04: qty 0.6

## 2021-05-04 MED ORDER — PEGFILGRASTIM-JMDB 6 MG/0.6ML ~~LOC~~ SOSY
6.0000 mg | PREFILLED_SYRINGE | Freq: Once | SUBCUTANEOUS | Status: AC
  Administered 2021-05-04: 6 mg via SUBCUTANEOUS

## 2021-05-04 NOTE — Patient Instructions (Signed)
Pegfilgrastim injection What is this medicine? PEGFILGRASTIM (PEG fil gra stim) is a long-acting granulocyte colony-stimulating factor that stimulates the growth of neutrophils, a type of white blood cell important in the body's fight against infection. It is used to reduce the incidence of fever and infection in patients with certain types of cancer who are receiving chemotherapy that affects the bone marrow, and to increase survival after being exposed to high doses of radiation. This medicine may be used for other purposes; ask your health care provider or pharmacist if you have questions. COMMON BRAND NAME(S): Fulphila, Neulasta, Nyvepria, UDENYCA, Ziextenzo What should I tell my health care provider before I take this medicine? They need to know if you have any of these conditions:  kidney disease  latex allergy  ongoing radiation therapy  sickle cell disease  skin reactions to acrylic adhesives (On-Body Injector only)  an unusual or allergic reaction to pegfilgrastim, filgrastim, other medicines, foods, dyes, or preservatives  pregnant or trying to get pregnant  breast-feeding How should I use this medicine? This medicine is for injection under the skin. If you get this medicine at home, you will be taught how to prepare and give the pre-filled syringe or how to use the On-body Injector. Refer to the patient Instructions for Use for detailed instructions. Use exactly as directed. Tell your healthcare provider immediately if you suspect that the On-body Injector may not have performed as intended or if you suspect the use of the On-body Injector resulted in a missed or partial dose. It is important that you put your used needles and syringes in a special sharps container. Do not put them in a trash can. If you do not have a sharps container, call your pharmacist or healthcare provider to get one. Talk to your pediatrician regarding the use of this medicine in children. While this drug  may be prescribed for selected conditions, precautions do apply. Overdosage: If you think you have taken too much of this medicine contact a poison control center or emergency room at once. NOTE: This medicine is only for you. Do not share this medicine with others. What if I miss a dose? It is important not to miss your dose. Call your doctor or health care professional if you miss your dose. If you miss a dose due to an On-body Injector failure or leakage, a new dose should be administered as soon as possible using a single prefilled syringe for manual use. What may interact with this medicine? Interactions have not been studied. This list may not describe all possible interactions. Give your health care provider a list of all the medicines, herbs, non-prescription drugs, or dietary supplements you use. Also tell them if you smoke, drink alcohol, or use illegal drugs. Some items may interact with your medicine. What should I watch for while using this medicine? Your condition will be monitored carefully while you are receiving this medicine. You may need blood work done while you are taking this medicine. Talk to your health care provider about your risk of cancer. You may be more at risk for certain types of cancer if you take this medicine. If you are going to need a MRI, CT scan, or other procedure, tell your doctor that you are using this medicine (On-Body Injector only). What side effects may I notice from receiving this medicine? Side effects that you should report to your doctor or health care professional as soon as possible:  allergic reactions (skin rash, itching or hives, swelling of   the face, lips, or tongue)  back pain  dizziness  fever  pain, redness, or irritation at site where injected  pinpoint red spots on the skin  red or dark-brown urine  shortness of breath or breathing problems  stomach or side pain, or pain at the shoulder  swelling  tiredness  trouble  passing urine or change in the amount of urine  unusual bruising or bleeding Side effects that usually do not require medical attention (report to your doctor or health care professional if they continue or are bothersome):  bone pain  muscle pain This list may not describe all possible side effects. Call your doctor for medical advice about side effects. You may report side effects to FDA at 1-800-FDA-1088. Where should I keep my medicine? Keep out of the reach of children. If you are using this medicine at home, you will be instructed on how to store it. Throw away any unused medicine after the expiration date on the label. NOTE: This sheet is a summary. It may not cover all possible information. If you have questions about this medicine, talk to your doctor, pharmacist, or health care provider.  2021 Elsevier/Gold Standard (2020-01-08 13:20:51)  

## 2021-05-16 ENCOUNTER — Inpatient Hospital Stay: Payer: Medicare HMO

## 2021-05-16 ENCOUNTER — Other Ambulatory Visit: Payer: Medicare HMO

## 2021-05-16 ENCOUNTER — Encounter: Payer: Self-pay | Admitting: *Deleted

## 2021-05-16 ENCOUNTER — Other Ambulatory Visit: Payer: Self-pay

## 2021-05-16 ENCOUNTER — Inpatient Hospital Stay: Payer: Medicare HMO | Admitting: Oncology

## 2021-05-16 VITALS — BP 126/71 | HR 75 | Temp 97.2°F | Resp 18 | Wt 174.4 lb

## 2021-05-16 DIAGNOSIS — C670 Malignant neoplasm of trigone of bladder: Secondary | ICD-10-CM

## 2021-05-16 DIAGNOSIS — Z5111 Encounter for antineoplastic chemotherapy: Secondary | ICD-10-CM | POA: Diagnosis not present

## 2021-05-16 DIAGNOSIS — Z95828 Presence of other vascular implants and grafts: Secondary | ICD-10-CM

## 2021-05-16 DIAGNOSIS — Z5189 Encounter for other specified aftercare: Secondary | ICD-10-CM | POA: Diagnosis not present

## 2021-05-16 DIAGNOSIS — C679 Malignant neoplasm of bladder, unspecified: Secondary | ICD-10-CM

## 2021-05-16 LAB — CBC WITH DIFFERENTIAL (CANCER CENTER ONLY)
Abs Immature Granulocytes: 0.32 10*3/uL — ABNORMAL HIGH (ref 0.00–0.07)
Basophils Absolute: 0.1 10*3/uL (ref 0.0–0.1)
Basophils Relative: 0 %
Eosinophils Absolute: 0.1 10*3/uL (ref 0.0–0.5)
Eosinophils Relative: 1 %
HCT: 33.7 % — ABNORMAL LOW (ref 39.0–52.0)
Hemoglobin: 10.7 g/dL — ABNORMAL LOW (ref 13.0–17.0)
Immature Granulocytes: 3 %
Lymphocytes Relative: 18 %
Lymphs Abs: 2 10*3/uL (ref 0.7–4.0)
MCH: 31.2 pg (ref 26.0–34.0)
MCHC: 31.8 g/dL (ref 30.0–36.0)
MCV: 98.3 fL (ref 80.0–100.0)
Monocytes Absolute: 0.9 10*3/uL (ref 0.1–1.0)
Monocytes Relative: 8 %
Neutro Abs: 7.9 10*3/uL — ABNORMAL HIGH (ref 1.7–7.7)
Neutrophils Relative %: 70 %
Platelet Count: 348 10*3/uL (ref 150–400)
RBC: 3.43 MIL/uL — ABNORMAL LOW (ref 4.22–5.81)
RDW: 16.9 % — ABNORMAL HIGH (ref 11.5–15.5)
WBC Count: 11.3 10*3/uL — ABNORMAL HIGH (ref 4.0–10.5)
nRBC: 0 % (ref 0.0–0.2)

## 2021-05-16 LAB — CMP (CANCER CENTER ONLY)
ALT: 9 U/L (ref 0–44)
AST: 17 U/L (ref 15–41)
Albumin: 3.3 g/dL — ABNORMAL LOW (ref 3.5–5.0)
Alkaline Phosphatase: 103 U/L (ref 38–126)
Anion gap: 9 (ref 5–15)
BUN: 8 mg/dL (ref 8–23)
CO2: 25 mmol/L (ref 22–32)
Calcium: 8.7 mg/dL — ABNORMAL LOW (ref 8.9–10.3)
Chloride: 106 mmol/L (ref 98–111)
Creatinine: 0.74 mg/dL (ref 0.61–1.24)
GFR, Estimated: >60 mL/min (ref >60)
Glucose, Bld: 128 mg/dL — ABNORMAL HIGH (ref 70–99)
Potassium: 3.8 mmol/L (ref 3.5–5.1)
Sodium: 140 mmol/L (ref 135–145)
Total Bilirubin: 0.3 mg/dL (ref 0.3–1.2)
Total Protein: 6.4 g/dL — ABNORMAL LOW (ref 6.5–8.1)

## 2021-05-16 MED ORDER — SODIUM CHLORIDE 0.9 % IV SOLN
10.0000 mg | Freq: Once | INTRAVENOUS | Status: AC
  Administered 2021-05-16: 10 mg via INTRAVENOUS
  Filled 2021-05-16: qty 10

## 2021-05-16 MED ORDER — SODIUM CHLORIDE 0.9 % IV SOLN
150.0000 mg | Freq: Once | INTRAVENOUS | Status: AC
  Administered 2021-05-16: 150 mg via INTRAVENOUS
  Filled 2021-05-16: qty 150

## 2021-05-16 MED ORDER — MAGNESIUM SULFATE 2 GM/50ML IV SOLN
INTRAVENOUS | Status: AC
  Filled 2021-05-16: qty 50

## 2021-05-16 MED ORDER — SODIUM CHLORIDE 0.9 % IV SOLN
Freq: Once | INTRAVENOUS | Status: AC
  Filled 2021-05-16: qty 250

## 2021-05-16 MED ORDER — SODIUM CHLORIDE 0.9% FLUSH
10.0000 mL | Freq: Once | INTRAVENOUS | Status: AC
  Administered 2021-05-16: 10 mL
  Filled 2021-05-16: qty 10

## 2021-05-16 MED ORDER — PALONOSETRON HCL INJECTION 0.25 MG/5ML
INTRAVENOUS | Status: AC
  Filled 2021-05-16: qty 5

## 2021-05-16 MED ORDER — MAGNESIUM SULFATE 2 GM/50ML IV SOLN
2.0000 g | Freq: Once | INTRAVENOUS | Status: AC
Start: 2021-05-16 — End: 2021-05-16
  Administered 2021-05-16: 2 g via INTRAVENOUS

## 2021-05-16 MED ORDER — SODIUM CHLORIDE 0.9 % IV SOLN
51.0000 mg/m2 | Freq: Once | INTRAVENOUS | Status: AC
  Administered 2021-05-16: 100 mg via INTRAVENOUS
  Filled 2021-05-16: qty 100

## 2021-05-16 MED ORDER — POTASSIUM CHLORIDE IN NACL 20-0.9 MEQ/L-% IV SOLN
Freq: Once | INTRAVENOUS | Status: AC
  Filled 2021-05-16: qty 1000

## 2021-05-16 MED ORDER — HEPARIN SOD (PORK) LOCK FLUSH 100 UNIT/ML IV SOLN
500.0000 [IU] | Freq: Once | INTRAVENOUS | Status: AC | PRN
  Administered 2021-05-16: 500 [IU]
  Filled 2021-05-16: qty 5

## 2021-05-16 MED ORDER — SODIUM CHLORIDE 0.9% FLUSH
10.0000 mL | INTRAVENOUS | Status: DC | PRN
  Administered 2021-05-16: 10 mL
  Filled 2021-05-16: qty 10

## 2021-05-16 MED ORDER — PALONOSETRON HCL INJECTION 0.25 MG/5ML
0.2500 mg | Freq: Once | INTRAVENOUS | Status: AC
  Administered 2021-05-16: 0.25 mg via INTRAVENOUS

## 2021-05-16 MED ORDER — GEMCITABINE HCL CHEMO INJECTION 1 GM/26.3ML
800.0000 mg/m2 | Freq: Once | INTRAVENOUS | Status: AC
  Administered 2021-05-16: 1558 mg via INTRAVENOUS
  Filled 2021-05-16: qty 40.98

## 2021-05-16 NOTE — Patient Instructions (Signed)
[facility]  Discharge Instructions: Thank you for choosing [facility] to provide your oncology and hematology care.   If you have a lab appointment with the Cancer Center, please go directly to the Cancer Center and check in at the registration area.   Wear comfortable clothing and clothing appropriate for easy access to any Portacath or PICC line.   We strive to give you quality time with your provider. You may need to reschedule your appointment if you arrive late (15 or more minutes).  Arriving late affects you and other patients whose appointments are after yours.  Also, if you miss three or more appointments without notifying the office, you may be dismissed from the clinic at the provider's discretion.      For prescription refill requests, have your pharmacy contact our office and allow 72 hours for refills to be completed.    Today you received the following chemotherapy and/or immunotherapy agents gemcitabine, cisplatin      To help prevent nausea and vomiting after your treatment, we encourage you to take your nausea medication as directed.  BELOW ARE SYMPTOMS THAT SHOULD BE REPORTED IMMEDIATELY: *FEVER GREATER THAN 100.4 F (38 C) OR HIGHER *CHILLS OR SWEATING *NAUSEA AND VOMITING THAT IS NOT CONTROLLED WITH YOUR NAUSEA MEDICATION *UNUSUAL SHORTNESS OF BREATH *UNUSUAL BRUISING OR BLEEDING *URINARY PROBLEMS (pain or burning when urinating, or frequent urination) *BOWEL PROBLEMS (unusual diarrhea, constipation, pain near the anus) TENDERNESS IN MOUTH AND THROAT WITH OR WITHOUT PRESENCE OF ULCERS (sore throat, sores in mouth, or a toothache) UNUSUAL RASH, SWELLING OR PAIN  UNUSUAL VAGINAL DISCHARGE OR ITCHING   Items with * indicate a potential emergency and should be followed up as soon as possible or go to the Emergency Department if any problems should occur.  Please show the CHEMOTHERAPY ALERT CARD or IMMUNOTHERAPY ALERT CARD at  check-in to the Emergency Department and triage nurse.  Should you have questions after your visit or need to cancel or reschedule your appointment, please contact [facility]  Dept: [phone]  and follow the prompts.  Office hours are 8:00 a.m. to 4:30 p.m. Monday - Friday. Please note that voicemails left after 4:00 p.m. may not be returned until the following business day.  We are closed weekends and major holidays. You have access to a nurse at all times for urgent questions. Please call the main number to the clinic Dept: [phone] and follow the prompts.   For any non-urgent questions, you may also contact your provider using MyChart. We now offer e-Visits for anyone 18 and older to request care online for non-urgent symptoms. For details visit [email or web address].   Also download the MyChart app! Go to the app store, search "MyChart", open the app, select [facility], and log in with your MyChart username and password.  Due to Covid, a mask is required upon entering the hospital/clinic. If you do not have a mask, one will be given to you upon arrival. For doctor visits, patients may have 1 support person aged 18 or older with them. For treatment visits, patients cannot have anyone with them due to current Covid guidelines and our immunocompromised population.   

## 2021-05-16 NOTE — Progress Notes (Signed)
Hematology and Oncology Follow Up Visit  William [name] [id number] 05-Jun-1941 80 y.o. 05/16/2021 8:02 AM [name], MDMcIntyre, [name], MD   Principle Diagnosis: 80 year old man with T2N0 bladder cancer diagnosed in February 2022.  He was found to have  high-grade urothelial carcinoma with 20% squamous differentiation.   Prior Therapy:  He is status post TURBT completed on February 08, 2021 which confirmed the diagnosis.  Current therapy: Neoadjuvant chemotherapy with gemcitabine and cisplatin started on March 14, 2021.   He is here for day 1 of cycle 4 of therapy.  Interim History: [name] presents today for repeat evaluation.  Since last visit, he reports no major changes in his health.  He has tolerated chemotherapy without any major complaints.  He denies any nausea, vomiting or abdominal pain.  He denies any worsening neuropathy.  He denies any hematuria, dysuria or discomfort.  He remains active and attends to activities of daily living.     Medications: Updated on review. Current Outpatient Medications  Medication Sig Dispense Refill  . b complex vitamins tablet Take 1 tablet by mouth daily.    . fluticasone (FLONASE) 50 MCG/ACT nasal spray Place 2 sprays into both nostrils daily. 16 g 6  . gabapentin (NEURONTIN) 300 MG capsule TAKE 3 CAPSULES BY MOUTH AT BEDTIME 540 capsule 3  . Iron-Vitamins (GERITOL) LIQD Take 5 mLs by mouth.    . lidocaine-prilocaine (EMLA) cream Apply 1 application topically as needed. 30 g 0  . Multiple Vitamin (MULTIVITAMIN WITH MINERALS) TABS tablet Take 1 tablet by mouth daily.    . naproxen (NAPROSYN) 500 MG tablet Take 1 tablet (500 mg total) by mouth 2 (two) times daily as needed. (Patient not taking: Reported on 03/21/2021) 30 tablet 0  . prochlorperazine (COMPAZINE) 10 MG tablet Take 1 tablet (10 mg total) by mouth every 6 (six) hours as needed for nausea or vomiting. 30 tablet 0  . senna-docusate (SENOKOT-S) 8.6-50 MG tablet Take 1  tablet by mouth 2 (two) times daily. While taking stronger pain meds to prevent constipation. (Patient not taking: Reported on 03/21/2021) 10 tablet 0  . tamsulosin (FLOMAX) 0.4 MG CAPS capsule Take 1 capsule (0.4 mg total) by mouth daily as needed. For urinary urgeny / weak stream after tumor resection 30 capsule 1  . tiotropium (SPIRIVA HANDIHALER) 18 MCG inhalation capsule Place 1 capsule (18 mcg total) into inhaler and inhale daily. 30 capsule 4  . tiZANidine (ZANAFLEX) 4 MG tablet Take 4 mg by mouth at bedtime.    . traMADol (ULTRAM) 50 MG tablet Take 1 tablet (50 mg total) by mouth every 6 (six) hours as needed for moderate pain. Post-operatively 15 tablet 0   No current facility-administered medications for this visit.   Facility-Administered Medications Ordered in Other Visits  Medication Dose Route Frequency Provider Last Rate Last Admin  . sodium chloride flush (NS) 0.9 % injection 10 mL  10 mL Intracatheter Once Wyatt Portela, MD         Allergies:  Allergies  Allergen Reactions  . Hydrocodone Other (See Comments)    Causes constipation even with stool softner      Physical Exam:  Blood pressure 126/71, pulse 75, temperature (!) 97.2 F (36.2 C), temperature source Tympanic, resp. rate 18, weight 174 lb 6.4 oz (79.1 kg), SpO2 95 %.     ECOG: 0    General appearance: Alert, awake without any distress. Head: Atraumatic without abnormalities Oropharynx: Without any thrush or ulcers. Eyes: No  scleral icterus. Lymph nodes: No lymphadenopathy noted in the cervical, supraclavicular, or axillary nodes Heart:regular rate and rhythm, without any murmurs or gallops.   Lung: Clear to auscultation without any rhonchi, wheezes or dullness to percussion. Abdomin: Soft, nontender without any shifting dullness or ascites. Musculoskeletal: No clubbing or cyanosis. Neurological: No motor or sensory deficits. Skin: No rashes or lesions.          Lab Results: Lab  Results  Component Value Date   WBC 3.4 (L) 05/02/2021   HGB 11.3 (L) 05/02/2021   HCT 34.3 (L) 05/02/2021   MCV 94.8 05/02/2021   PLT 311 05/02/2021     Chemistry      Component Value Date/Time   NA 139 05/02/2021 1026   NA 137 09/21/2020 0942   K 3.9 05/02/2021 1026   CL 102 05/02/2021 1026   CO2 28 05/02/2021 1026   BUN 18 05/02/2021 1026   BUN 5 (L) 09/21/2020 0942   CREATININE 0.86 05/02/2021 1026   CREATININE 0.90 02/07/2017 1032      Component Value Date/Time   CALCIUM 9.1 05/02/2021 1026   ALKPHOS 73 05/02/2021 1026   AST 25 05/02/2021 1026   ALT 14 05/02/2021 1026   BILITOT 0.9 05/02/2021 1026       Impression and Plan:    80 year old man with:  1.    Bladder cancer diagnosed in February 2022.  She presented with T2N0 high-grade urothelial carcinoma with squamous differentiation.  His disease status was updated at this time and treatment options were reviewed.  He is currently receiving neoadjuvant chemotherapy without any complications at this time.  Risks and benefits of proceeding with cycle 4 of chemotherapy were discussed.  Complications with cisplatin and gemcitabine were reiterated.  These would include nausea, fatigue, vomiting, myelosuppression and worsening neuropathy.  The plan is to update his staging scans before proceeding with a cystectomy upon completing chemotherapy.  He is agreeable to proceed at this time.  Laboratory data reviewed and showed adequate hematological parameters.  2. IV access:Port-A-Cath remains in use without any issues.  He wants his Port-A-Cath removed upon completing chemotherapy.  We will arrange for that after CT scan staging.  3. Antiemetics: Compazine is available to him without any nausea vomiting.   4. Renal function surveillance:Creatinine clearance remains within normal range despite platinum therapy.  5. Goals of care:Therapy remains curative at this time.  6.  Neutropenia: He will continue to  receive growth factor support given his risk of neutropenia and sepsis.  This will be given on day 10 of each cycle.  7. Follow-up: He will return next week to complete cycle 4 of therapy.  He will return in a few weeks after that to complete staging scans.  30  minutes were spent on this encounter.  Time was dedicated to reviewing disease status, discussing treatment options and future plan of care review.  Zola Button, MD 5/17/20228:02 AM

## 2021-05-23 ENCOUNTER — Inpatient Hospital Stay: Payer: Medicare HMO

## 2021-05-23 ENCOUNTER — Other Ambulatory Visit: Payer: Self-pay

## 2021-05-23 ENCOUNTER — Other Ambulatory Visit: Payer: Medicare HMO

## 2021-05-23 VITALS — BP 130/74 | HR 68 | Temp 97.7°F | Resp 18

## 2021-05-23 DIAGNOSIS — Z5111 Encounter for antineoplastic chemotherapy: Secondary | ICD-10-CM | POA: Diagnosis not present

## 2021-05-23 DIAGNOSIS — C679 Malignant neoplasm of bladder, unspecified: Secondary | ICD-10-CM

## 2021-05-23 DIAGNOSIS — C670 Malignant neoplasm of trigone of bladder: Secondary | ICD-10-CM

## 2021-05-23 DIAGNOSIS — Z5189 Encounter for other specified aftercare: Secondary | ICD-10-CM | POA: Diagnosis not present

## 2021-05-23 DIAGNOSIS — Z95828 Presence of other vascular implants and grafts: Secondary | ICD-10-CM

## 2021-05-23 LAB — CBC WITH DIFFERENTIAL (CANCER CENTER ONLY)
Abs Immature Granulocytes: 0.06 10*3/uL (ref 0.00–0.07)
Basophils Absolute: 0.1 10*3/uL (ref 0.0–0.1)
Basophils Relative: 1 %
Eosinophils Absolute: 0 10*3/uL (ref 0.0–0.5)
Eosinophils Relative: 0 %
HCT: 31.3 % — ABNORMAL LOW (ref 39.0–52.0)
Hemoglobin: 10.5 g/dL — ABNORMAL LOW (ref 13.0–17.0)
Immature Granulocytes: 1 %
Lymphocytes Relative: 27 %
Lymphs Abs: 1.3 10*3/uL (ref 0.7–4.0)
MCH: 32.6 pg (ref 26.0–34.0)
MCHC: 33.5 g/dL (ref 30.0–36.0)
MCV: 97.2 fL (ref 80.0–100.0)
Monocytes Absolute: 0.4 10*3/uL (ref 0.1–1.0)
Monocytes Relative: 8 %
Neutro Abs: 3.2 10*3/uL (ref 1.7–7.7)
Neutrophils Relative %: 63 %
Platelet Count: 276 10*3/uL (ref 150–400)
RBC: 3.22 MIL/uL — ABNORMAL LOW (ref 4.22–5.81)
RDW: 15.8 % — ABNORMAL HIGH (ref 11.5–15.5)
WBC Count: 5 10*3/uL (ref 4.0–10.5)
nRBC: 0 % (ref 0.0–0.2)

## 2021-05-23 LAB — CMP (CANCER CENTER ONLY)
ALT: 20 U/L (ref 0–44)
AST: 24 U/L (ref 15–41)
Albumin: 3.5 g/dL (ref 3.5–5.0)
Alkaline Phosphatase: 86 U/L (ref 38–126)
Anion gap: 9 (ref 5–15)
BUN: 12 mg/dL (ref 8–23)
CO2: 26 mmol/L (ref 22–32)
Calcium: 8.9 mg/dL (ref 8.9–10.3)
Chloride: 104 mmol/L (ref 98–111)
Creatinine: 0.72 mg/dL (ref 0.61–1.24)
GFR, Estimated: >60 mL/min (ref >60)
Glucose, Bld: 119 mg/dL — ABNORMAL HIGH (ref 70–99)
Potassium: 3.8 mmol/L (ref 3.5–5.1)
Sodium: 139 mmol/L (ref 135–145)
Total Bilirubin: 0.4 mg/dL (ref 0.3–1.2)
Total Protein: 6.8 g/dL (ref 6.5–8.1)

## 2021-05-23 MED ORDER — HEPARIN SOD (PORK) LOCK FLUSH 100 UNIT/ML IV SOLN
500.0000 [IU] | Freq: Once | INTRAVENOUS | Status: AC | PRN
  Administered 2021-05-23: 500 [IU]
  Filled 2021-05-23: qty 5

## 2021-05-23 MED ORDER — SODIUM CHLORIDE 0.9% FLUSH
10.0000 mL | INTRAVENOUS | Status: DC | PRN
  Administered 2021-05-23: 10 mL
  Filled 2021-05-23: qty 10

## 2021-05-23 MED ORDER — PROCHLORPERAZINE MALEATE 10 MG PO TABS
10.0000 mg | ORAL_TABLET | Freq: Once | ORAL | Status: AC
  Administered 2021-05-23: 10 mg via ORAL

## 2021-05-23 MED ORDER — SODIUM CHLORIDE 0.9 % IV SOLN
Freq: Once | INTRAVENOUS | Status: AC
  Filled 2021-05-23: qty 250

## 2021-05-23 MED ORDER — SODIUM CHLORIDE 0.9 % IV SOLN
800.0000 mg/m2 | Freq: Once | INTRAVENOUS | Status: AC
  Administered 2021-05-23: 1558 mg via INTRAVENOUS
  Filled 2021-05-23: qty 40.98

## 2021-05-23 MED ORDER — SODIUM CHLORIDE 0.9% FLUSH
10.0000 mL | Freq: Once | INTRAVENOUS | Status: AC
  Administered 2021-05-23: 10 mL
  Filled 2021-05-23: qty 10

## 2021-05-23 MED ORDER — PROCHLORPERAZINE MALEATE 10 MG PO TABS
ORAL_TABLET | ORAL | Status: AC
  Filled 2021-05-23: qty 1

## 2021-05-23 NOTE — Patient Instructions (Signed)
[facility]ICAL [facility]COL[facility]  Discharge Instructions: Thank you for choosing [facility] to provide your oncology and hematology care.   If you have a lab appointment with the Cancer Center, please go directly to the Cancer Center and check in at the registration area.   Wear comfortable clothing and clothing appropriate for easy access to any Portacath or PICC line.   We strive to give you quality time with your provider. You may need to reschedule your appointment if you arrive late (15 or more minutes).  Arriving late affects you and other patients whose appointments are after yours.  Also, if you miss three or more appointments without notifying the office, you may be dismissed from the clinic at the provider's discretion.      For prescription refill requests, have your pharmacy contact our office and allow 72 hours for refills to be completed.    Today you received the following chemotherapy and/or immunotherapy agents Gemzar      To help prevent nausea and vomiting after your treatment, we encourage you to take your nausea medication as directed.  BELOW ARE SYMPTOMS THAT SHOULD BE REPORTED IMMEDIATELY: *FEVER GREATER THAN 100.4 F (38 C) OR HIGHER *CHILLS OR SWEATING *NAUSEA AND VOMITING THAT IS NOT CONTROLLED WITH YOUR NAUSEA MEDICATION *UNUSUAL SHORTNESS OF BREATH *UNUSUAL BRUISING OR BLEEDING *URINARY PROBLEMS (pain or burning when urinating, or frequent urination) *BOWEL PROBLEMS (unusual diarrhea, constipation, pain near the anus) TENDERNESS IN MOUTH AND THROAT WITH OR WITHOUT PRESENCE OF ULCERS (sore throat, sores in mouth, or a toothache) UNUSUAL RASH, SWELLING OR PAIN  UNUSUAL VAGINAL DISCHARGE OR ITCHING   Items with * indicate a potential emergency and should be followed up as soon as possible or go to the Emergency Department if any problems should occur.  Please show the CHEMOTHERAPY ALERT CARD or IMMUNOTHERAPY ALERT CARD at check-in to the  Emergency Department and triage nurse.  Should you have questions after your visit or need to cancel or reschedule your appointment, please contact [facility]  Dept: [phone]  and follow the prompts.  Office hours are 8:00 a.m. to 4:30 p.m. Monday - Friday. Please note that voicemails left after 4:00 p.m. may not be returned until the following business day.  We are closed weekends and major holidays. You have access to a nurse at all times for urgent questions. Please call the main number to the clinic Dept: [phone] and follow the prompts.   For any non-urgent questions, you may also contact your provider using MyChart. We now offer e-Visits for anyone 18 and older to request care online for non-urgent symptoms. For details visit [email or web address].   Also download the MyChart app! Go to the app store, search "MyChart", open the app, select [facility], and log in with your MyChart username and password.  Due to Covid, a mask is required upon entering the hospital/clinic. If you do not have a mask, one will be given to you upon arrival. For doctor visits, patients may have 1 support person aged 18 or older with them. For treatment visits, patients cannot have anyone with them due to current Covid guidelines and our immunocompromised population.   

## 2021-05-25 ENCOUNTER — Other Ambulatory Visit: Payer: Self-pay

## 2021-05-25 ENCOUNTER — Inpatient Hospital Stay: Payer: Medicare HMO

## 2021-05-25 DIAGNOSIS — C679 Malignant neoplasm of bladder, unspecified: Secondary | ICD-10-CM | POA: Diagnosis not present

## 2021-05-25 DIAGNOSIS — Z5189 Encounter for other specified aftercare: Secondary | ICD-10-CM | POA: Diagnosis not present

## 2021-05-25 DIAGNOSIS — Z5111 Encounter for antineoplastic chemotherapy: Secondary | ICD-10-CM | POA: Diagnosis not present

## 2021-05-25 MED ORDER — PEGFILGRASTIM-JMDB 6 MG/0.6ML ~~LOC~~ SOSY
6.0000 mg | PREFILLED_SYRINGE | Freq: Once | SUBCUTANEOUS | Status: AC
  Administered 2021-05-25: 6 mg via SUBCUTANEOUS

## 2021-05-25 MED ORDER — PEGFILGRASTIM-JMDB 6 MG/0.6ML ~~LOC~~ SOSY
PREFILLED_SYRINGE | SUBCUTANEOUS | Status: AC
  Filled 2021-05-25: qty 0.6

## 2021-05-25 NOTE — Patient Instructions (Signed)
Pegfilgrastim injection What is this medicine? PEGFILGRASTIM (PEG fil gra stim) is a long-acting granulocyte colony-stimulating factor that stimulates the growth of neutrophils, a type of white blood cell important in the body's fight against infection. It is used to reduce the incidence of fever and infection in patients with certain types of cancer who are receiving chemotherapy that affects the bone marrow, and to increase survival after being exposed to high doses of radiation. This medicine may be used for other purposes; ask your health care provider or pharmacist if you have questions. COMMON BRAND NAME(S): Fulphila, Neulasta, Nyvepria, UDENYCA, Ziextenzo What should I tell my health care provider before I take this medicine? They need to know if you have any of these conditions:  kidney disease  latex allergy  ongoing radiation therapy  sickle cell disease  skin reactions to acrylic adhesives (On-Body Injector only)  an unusual or allergic reaction to pegfilgrastim, filgrastim, other medicines, foods, dyes, or preservatives  pregnant or trying to get pregnant  breast-feeding How should I use this medicine? This medicine is for injection under the skin. If you get this medicine at home, you will be taught how to prepare and give the pre-filled syringe or how to use the On-body Injector. Refer to the patient Instructions for Use for detailed instructions. Use exactly as directed. Tell your healthcare provider immediately if you suspect that the On-body Injector may not have performed as intended or if you suspect the use of the On-body Injector resulted in a missed or partial dose. It is important that you put your used needles and syringes in a special sharps container. Do not put them in a trash can. If you do not have a sharps container, call your pharmacist or healthcare provider to get one. Talk to your pediatrician regarding the use of this medicine in children. While this drug  may be prescribed for selected conditions, precautions do apply. Overdosage: If you think you have taken too much of this medicine contact a poison control center or emergency room at once. NOTE: This medicine is only for you. Do not share this medicine with others. What if I miss a dose? It is important not to miss your dose. Call your doctor or health care professional if you miss your dose. If you miss a dose due to an On-body Injector failure or leakage, a new dose should be administered as soon as possible using a single prefilled syringe for manual use. What may interact with this medicine? Interactions have not been studied. This list may not describe all possible interactions. Give your health care provider a list of all the medicines, herbs, non-prescription drugs, or dietary supplements you use. Also tell them if you smoke, drink alcohol, or use illegal drugs. Some items may interact with your medicine. What should I watch for while using this medicine? Your condition will be monitored carefully while you are receiving this medicine. You may need blood work done while you are taking this medicine. Talk to your health care provider about your risk of cancer. You may be more at risk for certain types of cancer if you take this medicine. If you are going to need a MRI, CT scan, or other procedure, tell your doctor that you are using this medicine (On-Body Injector only). What side effects may I notice from receiving this medicine? Side effects that you should report to your doctor or health care professional as soon as possible:  allergic reactions (skin rash, itching or hives, swelling of   the face, lips, or tongue)  back pain  dizziness  fever  pain, redness, or irritation at site where injected  pinpoint red spots on the skin  red or dark-brown urine  shortness of breath or breathing problems  stomach or side pain, or pain at the shoulder  swelling  tiredness  trouble  passing urine or change in the amount of urine  unusual bruising or bleeding Side effects that usually do not require medical attention (report to your doctor or health care professional if they continue or are bothersome):  bone pain  muscle pain This list may not describe all possible side effects. Call your doctor for medical advice about side effects. You may report side effects to FDA at 1-800-FDA-1088. Where should I keep my medicine? Keep out of the reach of children. If you are using this medicine at home, you will be instructed on how to store it. Throw away any unused medicine after the expiration date on the label. NOTE: This sheet is a summary. It may not cover all possible information. If you have questions about this medicine, talk to your doctor, pharmacist, or health care provider.  2021 Elsevier/Gold Standard (2020-01-08 13:20:51)  

## 2021-05-30 ENCOUNTER — Ambulatory Visit: Payer: Medicare HMO | Admitting: Podiatry

## 2021-05-30 ENCOUNTER — Encounter: Payer: Self-pay | Admitting: Podiatry

## 2021-05-30 ENCOUNTER — Other Ambulatory Visit: Payer: Self-pay

## 2021-05-30 DIAGNOSIS — B351 Tinea unguium: Secondary | ICD-10-CM

## 2021-05-30 DIAGNOSIS — M79609 Pain in unspecified limb: Secondary | ICD-10-CM

## 2021-05-30 NOTE — Progress Notes (Signed)
    Subjective:  Patient ID: William [name], male    DOB: 28-Feb-1941,  MRN: [id number]  Chief Complaint  Patient presents with  . Nail Problem      nail fungus check, nail trim    80 y.o. male presents with the above complaint. History confirmed with patient.  He took the Lamisil for 90 days, has not noticed any difference  Objective:  Physical Exam: warm, good capillary refill, no trophic changes or ulcerative lesions, normal DP and PT pulses and normal sensory exam. B/L hallux yellowed and thickend  Assessment:   1. Pain due to onychomycosis of nail      Plan:  Patient was evaluated and treated and all questions answered.  Has not had any change in the appearance or texture of the nails.  He is interested in having them permanently removed.  He is due for a CT scan to see if he needs further chemotherapy which she just finished last week.  If he has no further chemotherapy planned I would recommend we proceed with total permanent nail avulsion.  He will call me to schedule after he is finished with his chemo  Return after chemotherapy for nail procedure.

## 2021-06-09 ENCOUNTER — Encounter (HOSPITAL_COMMUNITY): Payer: Self-pay

## 2021-06-09 ENCOUNTER — Other Ambulatory Visit: Payer: Self-pay

## 2021-06-09 ENCOUNTER — Ambulatory Visit (HOSPITAL_COMMUNITY)
Admission: RE | Admit: 2021-06-09 | Discharge: 2021-06-09 | Disposition: A | Discharge status: Home / Self Care | Payer: Medicare HMO | Source: Ambulatory Visit | Attending: Oncology | Admitting: Oncology

## 2021-06-09 ENCOUNTER — Inpatient Hospital Stay: Payer: Medicare HMO | Attending: Oncology

## 2021-06-09 DIAGNOSIS — C679 Malignant neoplasm of bladder, unspecified: Secondary | ICD-10-CM | POA: Diagnosis not present

## 2021-06-09 DIAGNOSIS — Z95828 Presence of other vascular implants and grafts: Secondary | ICD-10-CM

## 2021-06-09 DIAGNOSIS — C670 Malignant neoplasm of trigone of bladder: Secondary | ICD-10-CM | POA: Diagnosis not present

## 2021-06-09 DIAGNOSIS — I700 Atherosclerosis of aorta: Secondary | ICD-10-CM | POA: Diagnosis not present

## 2021-06-09 DIAGNOSIS — Z79899 Other long term (current) drug therapy: Secondary | ICD-10-CM | POA: Insufficient documentation

## 2021-06-09 DIAGNOSIS — K661 Hemoperitoneum: Secondary | ICD-10-CM | POA: Diagnosis not present

## 2021-06-09 DIAGNOSIS — Z885 Allergy status to narcotic agent status: Secondary | ICD-10-CM | POA: Insufficient documentation

## 2021-06-09 DIAGNOSIS — J479 Bronchiectasis, uncomplicated: Secondary | ICD-10-CM | POA: Insufficient documentation

## 2021-06-09 DIAGNOSIS — J439 Emphysema, unspecified: Secondary | ICD-10-CM | POA: Diagnosis not present

## 2021-06-09 DIAGNOSIS — D709 Neutropenia, unspecified: Secondary | ICD-10-CM | POA: Diagnosis not present

## 2021-06-09 DIAGNOSIS — J398 Other specified diseases of upper respiratory tract: Secondary | ICD-10-CM | POA: Diagnosis not present

## 2021-06-09 DIAGNOSIS — Z5111 Encounter for antineoplastic chemotherapy: Secondary | ICD-10-CM | POA: Diagnosis not present

## 2021-06-09 DIAGNOSIS — I2510 Atherosclerotic heart disease of native coronary artery without angina pectoris: Secondary | ICD-10-CM | POA: Insufficient documentation

## 2021-06-09 DIAGNOSIS — K4020 Bilateral inguinal hernia, without obstruction or gangrene, not specified as recurrent: Secondary | ICD-10-CM | POA: Diagnosis not present

## 2021-06-09 LAB — CMP (CANCER CENTER ONLY)
ALT: 10 U/L (ref 0–44)
AST: 22 U/L (ref 15–41)
Albumin: 3.6 g/dL (ref 3.5–5.0)
Alkaline Phosphatase: 106 U/L (ref 38–126)
Anion gap: 7 (ref 5–15)
BUN: 13 mg/dL (ref 8–23)
CO2: 27 mmol/L (ref 22–32)
Calcium: 8.8 mg/dL — ABNORMAL LOW (ref 8.9–10.3)
Chloride: 104 mmol/L (ref 98–111)
Creatinine: 0.72 mg/dL (ref 0.61–1.24)
GFR, Estimated: >60 mL/min (ref >60)
Glucose, Bld: 85 mg/dL (ref 70–99)
Potassium: 4.1 mmol/L (ref 3.5–5.1)
Sodium: 138 mmol/L (ref 135–145)
Total Bilirubin: 0.4 mg/dL (ref 0.3–1.2)
Total Protein: 6.7 g/dL (ref 6.5–8.1)

## 2021-06-09 LAB — CBC WITH DIFFERENTIAL (CANCER CENTER ONLY)
Abs Immature Granulocytes: 0.08 10*3/uL — ABNORMAL HIGH (ref 0.00–0.07)
Basophils Absolute: 0 10*3/uL (ref 0.0–0.1)
Basophils Relative: 0 %
Eosinophils Absolute: 0.1 10*3/uL (ref 0.0–0.5)
Eosinophils Relative: 1 %
HCT: 33.2 % — ABNORMAL LOW (ref 39.0–52.0)
Hemoglobin: 10.8 g/dL — ABNORMAL LOW (ref 13.0–17.0)
Immature Granulocytes: 1 %
Lymphocytes Relative: 22 %
Lymphs Abs: 2.2 10*3/uL (ref 0.7–4.0)
MCH: 32.6 pg (ref 26.0–34.0)
MCHC: 32.5 g/dL (ref 30.0–36.0)
MCV: 100.3 fL — ABNORMAL HIGH (ref 80.0–100.0)
Monocytes Absolute: 1 10*3/uL (ref 0.1–1.0)
Monocytes Relative: 10 %
Neutro Abs: 6.6 10*3/uL (ref 1.7–7.7)
Neutrophils Relative %: 66 %
Platelet Count: 302 10*3/uL (ref 150–400)
RBC: 3.31 MIL/uL — ABNORMAL LOW (ref 4.22–5.81)
RDW: 17.6 % — ABNORMAL HIGH (ref 11.5–15.5)
WBC Count: 10 10*3/uL (ref 4.0–10.5)
nRBC: 0 % (ref 0.0–0.2)

## 2021-06-09 MED ORDER — HEPARIN SOD (PORK) LOCK FLUSH 100 UNIT/ML IV SOLN: 500.0000 [IU] | Freq: Once | INTRAVENOUS | Status: AC

## 2021-06-09 MED ORDER — SODIUM CHLORIDE 0.9% FLUSH
10.0000 mL | Freq: Once | INTRAVENOUS | Status: AC
Start: 2021-06-09 — End: 2021-06-09
  Administered 2021-06-09: 10 mL
  Filled 2021-06-09: qty 10

## 2021-06-09 MED ORDER — HEPARIN SOD (PORK) LOCK FLUSH 100 UNIT/ML IV SOLN
INTRAVENOUS | Status: AC
  Administered 2021-06-09: 500 [IU] via INTRAVENOUS
  Filled 2021-06-09: qty 5

## 2021-06-14 ENCOUNTER — Other Ambulatory Visit: Payer: Self-pay

## 2021-06-14 ENCOUNTER — Inpatient Hospital Stay (HOSPITAL_BASED_OUTPATIENT_CLINIC_OR_DEPARTMENT_OTHER): Payer: Medicare HMO | Admitting: Oncology

## 2021-06-14 VITALS — BP 131/76 | HR 73 | Temp 97.2°F | Resp 17 | Ht 67.5 in | Wt 168.3 lb

## 2021-06-14 DIAGNOSIS — D709 Neutropenia, unspecified: Secondary | ICD-10-CM | POA: Diagnosis not present

## 2021-06-14 DIAGNOSIS — Z79899 Other long term (current) drug therapy: Secondary | ICD-10-CM | POA: Diagnosis not present

## 2021-06-14 DIAGNOSIS — C679 Malignant neoplasm of bladder, unspecified: Secondary | ICD-10-CM | POA: Diagnosis not present

## 2021-06-14 DIAGNOSIS — C670 Malignant neoplasm of trigone of bladder: Secondary | ICD-10-CM

## 2021-06-14 DIAGNOSIS — I700 Atherosclerosis of aorta: Secondary | ICD-10-CM | POA: Diagnosis not present

## 2021-06-14 DIAGNOSIS — Z885 Allergy status to narcotic agent status: Secondary | ICD-10-CM | POA: Diagnosis not present

## 2021-06-14 DIAGNOSIS — J479 Bronchiectasis, uncomplicated: Secondary | ICD-10-CM | POA: Diagnosis not present

## 2021-06-14 DIAGNOSIS — I2510 Atherosclerotic heart disease of native coronary artery without angina pectoris: Secondary | ICD-10-CM | POA: Diagnosis not present

## 2021-06-14 DIAGNOSIS — J439 Emphysema, unspecified: Secondary | ICD-10-CM | POA: Diagnosis not present

## 2021-06-14 NOTE — Progress Notes (Signed)
Hematology and Oncology Follow Up   William [name] [id number] Nov 29, 1941 80 y.o. 06/14/2021 8:25 AM William [name], MDMcIntyre, William Limber, MD      Principle Diagnosis: 80 year old man with bladder cancer diagnosed with T2N0  high-grade urothelial carcinoma with 20% squamous differentiation in February 2022.     Prior Therapy:   He is status post TURBT completed on February 08, 2021 which confirmed the diagnosis.  Neoadjuvant chemotherapy with gemcitabine and cisplatin started on March 14, 2021.    He completed 4 cycles of therapy on May 23, 2021.    Current therapy: Active surveillance and under evaluation for radical cystectomy.    Interim History: William [name] reports no major complaints at this time.  He has tolerated chemotherapy without any major complaints or residual issues.  He denies any worsening neuropathy, nausea or vomiting or complications related to his Port-A-Cath.  He has resumed most activities of daily living without any issues.    Medications: I have reviewed the patient's current medications.  Current Outpatient Medications  Medication Sig Dispense Refill   b complex vitamins tablet Take 1 tablet by mouth daily.     fluticasone (FLONASE) 50 MCG/ACT nasal spray Place 2 sprays into both nostrils daily. 16 g 6   gabapentin (NEURONTIN) 300 MG capsule TAKE 3 CAPSULES BY MOUTH AT BEDTIME 540 capsule 3   Iron-Vitamins (GERITOL) LIQD Take 5 mLs by mouth.     lidocaine-prilocaine (EMLA) cream Apply 1 application topically as needed. 30 g 0   Multiple Vitamin (MULTIVITAMIN WITH MINERALS) TABS tablet Take 1 tablet by mouth daily.     naproxen (NAPROSYN) 500 MG tablet Take 1 tablet (500 mg total) by mouth 2 (two) times daily as needed. (Patient not taking: Reported on 03/21/2021) 30 tablet 0   prochlorperazine (COMPAZINE) 10 MG tablet Take 1 tablet (10 mg total) by mouth every 6 (six) hours as needed for nausea or vomiting. 30 tablet 0   senna-docusate (SENOKOT-S)  8.6-50 MG tablet Take 1 tablet by mouth 2 (two) times daily. While taking stronger pain meds to prevent constipation. (Patient not taking: Reported on 03/21/2021) 10 tablet 0   tamsulosin (FLOMAX) 0.4 MG CAPS capsule Take 1 capsule (0.4 mg total) by mouth daily as needed. For urinary urgeny / weak stream after tumor resection 30 capsule 1   tiotropium (SPIRIVA HANDIHALER) 18 MCG inhalation capsule Place 1 capsule (18 mcg total) into inhaler and inhale daily. 30 capsule 4   tiZANidine (ZANAFLEX) 4 MG tablet Take 4 mg by mouth at bedtime.     traMADol (ULTRAM) 50 MG tablet Take 1 tablet (50 mg total) by mouth every 6 (six) hours as needed for moderate pain. Post-operatively 15 tablet 0   No current facility-administered medications for this visit.     Allergies:  Allergies  Allergen Reactions   Hydrocodone Other (See Comments)    Causes constipation even with stool softner   Physical examination  Blood pressure 131/76, pulse 73, temperature (!) 97.2 F (36.2 C), temperature source Tympanic, resp. rate 17, height 5' 7.5" (1.715 m), weight 168 lb 4.8 oz (76.3 kg), SpO2 98 %.  ECOG 1  General appearance: Comfortable appearing without any discomfort Head: Normocephalic without any trauma Oropharynx: Mucous membranes are moist and pink without any thrush or ulcers. Eyes: Pupils are equal and round reactive to light. Lymph nodes: No cervical, supraclavicular, inguinal or axillary lymphadenopathy.   Heart:regular rate and rhythm.  S1 and S2 without leg edema. Lung: Clear without any rhonchi  or wheezes.  No dullness to percussion. Abdomin: Soft, nontender, nondistended with good bowel sounds.  No hepatosplenomegaly. Musculoskeletal: No joint deformity or effusion.  Full range of motion noted. Neurological: No deficits noted on motor, sensory and deep tendon reflex exam. Skin: No petechial rash or dryness.  Appeared moist.       Lab Results: Lab Results  Component Value Date   WBC 10.0  06/09/2021   HGB 10.8 (L) 06/09/2021   HCT 33.2 (L) 06/09/2021   MCV 100.3 (H) 06/09/2021   PLT 302 06/09/2021     Chemistry      Component Value Date/Time   NA 138 06/09/2021 1232   NA 137 09/21/2020 0942   K 4.1 06/09/2021 1232   CL 104 06/09/2021 1232   CO2 27 06/09/2021 1232   BUN 13 06/09/2021 1232   BUN 5 (L) 09/21/2020 0942   CREATININE 0.72 06/09/2021 1232   CREATININE 0.90 02/07/2017 1032      Component Value Date/Time   CALCIUM 8.8 (L) 06/09/2021 1232   ALKPHOS 106 06/09/2021 1232   AST 22 06/09/2021 1232   ALT 10 06/09/2021 1232   BILITOT 0.4 06/09/2021 1232       Radiological Studies:  IMPRESSION: 1. Bladder is poorly evaluated due to low volume and lack of IV contrast. No evidence of metastatic disease. 2. Aortic atherosclerosis (ICD10-I70.0). Coronary artery calcification. 3. Peripheral and basilar predominant subpleural reticular densities, ground-glass, traction bronchiolectasis and probable honeycombing, findings indicative of interstitial lung disease. Usual interstitial pneumonitis is favored. 4.  Emphysema (ICD10-J43.9).     Impression and Plan:  80 year old man with:   1.  T2N0 high-grade urothelial carcinoma of the bladder with squamous differentiation diagnosed in February 2022.   He is currently on active surveillance after he completed 4 cycles of neoadjuvant chemotherapy without any major complications.  CT scan obtained on June 09, 2021 showed no evidence of metastatic disease.  His disease status was discussed at this time treatment options moving forward were reviewed.  Proceeding with a radical cystectomy remains the recommended choice at this time for long-term disease control.  He is in agreement with this plan.  He is scheduled to meet with Dr. Tresa [name] in July and tentatively proceed with surgery in the early part of August.   2.  IV access: Port-A-Cath removal was discussed at this time including risks and benefits of keeping it.   After discussion he opted to remove it and we will arrange that in the near future.    3.  Neutropenia: Resolved at this time with adequate hematological parameters.   4.  Follow-up: In 3 months for repeat evaluation after his radical cystectomy.    30  minutes were dedicated to this visit. The time was spent on reviewing laboratory data, imaging studies, discussing treatment options, and answering questions regarding future plan.   Zola Button, MD 06/14/2021 8:25 AM

## 2021-06-27 ENCOUNTER — Ambulatory Visit: Payer: Medicare HMO | Admitting: Podiatry

## 2021-06-28 ENCOUNTER — Other Ambulatory Visit: Payer: Self-pay | Admitting: Radiology

## 2021-06-29 ENCOUNTER — Ambulatory Visit (HOSPITAL_COMMUNITY)
Admission: RE | Admit: 2021-06-29 | Discharge: 2021-06-29 | Disposition: A | Discharge status: Home / Self Care | Payer: Medicare HMO | Source: Ambulatory Visit | Attending: Oncology | Admitting: Oncology

## 2021-06-29 ENCOUNTER — Other Ambulatory Visit: Payer: Self-pay

## 2021-06-29 ENCOUNTER — Encounter (HOSPITAL_COMMUNITY): Payer: Self-pay

## 2021-06-29 DIAGNOSIS — Z452 Encounter for adjustment and management of vascular access device: Secondary | ICD-10-CM | POA: Insufficient documentation

## 2021-06-29 DIAGNOSIS — C679 Malignant neoplasm of bladder, unspecified: Secondary | ICD-10-CM | POA: Insufficient documentation

## 2021-06-29 DIAGNOSIS — C670 Malignant neoplasm of trigone of bladder: Secondary | ICD-10-CM

## 2021-06-29 HISTORY — PX: IR REMOVAL TUN ACCESS W/ PORT W/O FL MOD SED: IMG2290

## 2021-06-29 LAB — CBC WITH DIFFERENTIAL/PLATELET
Abs Immature Granulocytes: 0.02 10*3/uL (ref 0.00–0.07)
Basophils Absolute: 0.1 10*3/uL (ref 0.0–0.1)
Basophils Relative: 1 %
Eosinophils Absolute: 0.2 10*3/uL (ref 0.0–0.5)
Eosinophils Relative: 3 %
HCT: 38.9 % — ABNORMAL LOW (ref 39.0–52.0)
Hemoglobin: 12.3 g/dL — ABNORMAL LOW (ref 13.0–17.0)
Immature Granulocytes: 0 %
Lymphocytes Relative: 29 %
Lymphs Abs: 1.8 10*3/uL (ref 0.7–4.0)
MCH: 32.4 pg (ref 26.0–34.0)
MCHC: 31.6 g/dL (ref 30.0–36.0)
MCV: 102.4 fL — ABNORMAL HIGH (ref 80.0–100.0)
Monocytes Absolute: 0.6 10*3/uL (ref 0.1–1.0)
Monocytes Relative: 10 %
Neutro Abs: 3.4 10*3/uL (ref 1.7–7.7)
Neutrophils Relative %: 57 %
Platelets: 237 10*3/uL (ref 150–400)
RBC: 3.8 MIL/uL — ABNORMAL LOW (ref 4.22–5.81)
RDW: 14.2 % (ref 11.5–15.5)
WBC: 6 10*3/uL (ref 4.0–10.5)
nRBC: 0 % (ref 0.0–0.2)

## 2021-06-29 LAB — BASIC METABOLIC PANEL
Anion gap: 7 (ref 5–15)
BUN: 15 mg/dL (ref 8–23)
CO2: 27 mmol/L (ref 22–32)
Calcium: 9.3 mg/dL (ref 8.9–10.3)
Chloride: 105 mmol/L (ref 98–111)
Creatinine, Ser: 0.76 mg/dL (ref 0.61–1.24)
GFR, Estimated: >60 mL/min (ref >60)
Glucose, Bld: 100 mg/dL — ABNORMAL HIGH (ref 70–99)
Potassium: 4.4 mmol/L (ref 3.5–5.1)
Sodium: 139 mmol/L (ref 135–145)

## 2021-06-29 LAB — PROTIME-INR
INR: 1 (ref 0.8–1.2)
Prothrombin Time: 12.7 seconds (ref 11.4–15.2)

## 2021-06-29 MED ORDER — SODIUM CHLORIDE 0.9 % IV SOLN: INTRAVENOUS | Status: DC

## 2021-06-29 MED ORDER — LIDOCAINE-EPINEPHRINE 1 %-1:100000 IJ SOLN
INTRAMUSCULAR | Status: AC
  Filled 2021-06-29: qty 1

## 2021-06-29 MED ORDER — LIDOCAINE-EPINEPHRINE 1 %-1:100000 IJ SOLN
INTRAMUSCULAR | Status: AC | PRN
  Administered 2021-06-29: 10 mL

## 2021-06-29 NOTE — Procedures (Signed)
Interventional Radiology Procedure:   Indications: Bladder cancer and completed treatment  Procedure: Port removal  Findings: Complete removal of right chest port  Complications: None     EBL: Minimal, less than 10 ml  Plan: Discharge in one hour.  Keep incision dry for at least 24 hours.     Deardra Hinkley R. Anselm Pancoast, MD  Pager: [phone]

## 2021-06-29 NOTE — H&P (Signed)
IR consulted by Dr. Alen Blew for possible Port-a-cath removal.  Patient presents today for above procedure. Awake and alert sitting in bed with no complaints at this time. Denies fever, chills, chest pain, dyspnea, abdominal pain, or headache.  Plan for Port-a-cath removal today in IR, no sedation per patient's request.  Please call IR with questions/concerns.   [name], PA-C 06/29/2021, 9:15 AM

## 2021-06-29 NOTE — Discharge Instructions (Signed)
Interventional radiology phone numbers [phone] After hours [phone]  Implanted Port Removal, Care After This sheet gives you information about how to care for yourself after your procedure. Your health care provider may also give you more specific instructions. If you have problems or questions, contact your health care provider. What can I expect after the procedure? After the procedure, it is common to have: Soreness or pain near your incision. Some swelling or bruising near your incision. Follow these instructions at home: Medicines Take over-the-counter and prescription medicines only as told by your health care provider. If you were prescribed an antibiotic medicine, take it as told by your health care provider. Do not stop taking the antibiotic even if you start to feel better. Bathing Do not take baths, swim, or use a hot tub until your health care provider approves. You may shower tomorrow. Remove your dressing prior to your shower. Incision care Follow instructions from your health care provider about how to take care of your incision. Make sure you: Wash your hands with soap and water before you change your bandage (dressing). If soap and water are not available, use hand sanitizer. Keep your dressing dry. Leave  skin glue in place. These skin closures may need to stay in place for 2 weeks or longer. . Check your incision area every day for signs of infection. Check for: More redness, swelling, or pain. More fluid or blood. Warmth. Pus or a bad smell.   Driving Do not drive for 24 hours if you were given a medicine to help you relax (sedative) during your procedure. If you did not receive a sedative, ask your health care provider when it is safe to drive.   Activity Return to your normal activities as told by your health care provider. Ask your health care provider what activities are safe for you. Do not lift anything that is heavier than 10 lb (4.5 kg), or the  limit that you are told, until your health care provider says that it is safe. Do not do activities that involve lifting your arms over your head. General instructions Do not use any products that contain nicotine or tobacco, such as cigarettes and e-cigarettes. These can delay healing. If you need help quitting, ask your health care provider. Keep all follow-up visits as told by your health care provider. This is important. Contact a health care provider if: You have more redness, swelling, or pain around your incision. You have more fluid or blood coming from your incision. Your incision feels warm to the touch. You have pus or a bad smell coming from your incision. You have pain that is not relieved by your pain medicine. Get help right away if you have: A fever or chills. Chest pain. Difficulty breathing. Summary After the procedure, it is common to have pain, soreness, swelling, or bruising near your incision. If you were prescribed an antibiotic medicine, take it as told by your health care provider. Do not stop taking the antibiotic even if you start to feel better. Do not drive for 24 hours if you were given a sedative during your procedure. Return to your normal activities as told by your health care provider. Ask your health care provider what activities are safe for you. This information is not intended to replace advice given to you by your health care provider. Make sure you discuss any questions you have with your health care provider. Document Revised: 01/30/2018 Document Reviewed: 01/30/2018 Elsevier Patient Education  2021 Else[employer] Inc.       Moderate Conscious Sedation, Adult, Care After This sheet gives you information about how to care for yourself after your procedure. Your health care provider may also give you more specific instructions. If you have problems or questions, contact your health care provider. What can I expect after the procedure? After the procedure,  it is common to have: Sleepiness for several hours. Impaired judgment for several hours. Difficulty with balance. Vomiting if you eat too soon. Follow these instructions at home: For the time period you were told by your health care provider: Rest. Do not participate in activities where you could fall or become injured. Do not drive or use machinery. Do not drink alcohol. Do not take sleeping pills or medicines that cause drowsiness. Do not make important decisions or sign legal documents. Do not take care of children on your own.      Eating and drinking Follow the diet recommended by your health care provider. Drink enough fluid to keep your urine pale yellow. If you vomit: Drink water, juice, or soup when you can drink without vomiting. Make sure you have little or no nausea before eating solid foods.   General instructions Take over-the-counter and prescription medicines only as told by your health care provider. Have a responsible adult stay with you for the time you are told. It is important to have someone help care for you until you are awake and alert. Do not smoke. Keep all follow-up visits as told by your health care provider. This is important. Contact a health care provider if: You are still sleepy or having trouble with balance after 24 hours. You feel light-headed. You keep feeling nauseous or you keep vomiting. You develop a rash. You have a fever. You have redness or swelling around the IV site. Get help right away if: You have trouble breathing. You have new-onset confusion at home. Summary After the procedure, it is common to feel sleepy, have impaired judgment, or feel nauseous if you eat too soon. Rest after you get home. Know the things you should not do after the procedure. Follow the diet recommended by your health care provider and drink enough fluid to keep your urine pale yellow. Get help right away if you have trouble breathing or new-onset  confusion at home. This information is not intended to replace advice given to you by your health care provider. Make sure you discuss any questions you have with your health care provider. Document Revised: 04/15/2020 Document Reviewed: 11/12/2019 Elsevier Patient Education  2021 Elsevier Inc.  

## 2021-07-04 ENCOUNTER — Other Ambulatory Visit: Payer: Self-pay

## 2021-07-04 ENCOUNTER — Encounter: Payer: Self-pay | Admitting: Podiatry

## 2021-07-04 ENCOUNTER — Ambulatory Visit: Payer: Medicare HMO | Admitting: Podiatry

## 2021-07-04 DIAGNOSIS — B351 Tinea unguium: Secondary | ICD-10-CM

## 2021-07-04 DIAGNOSIS — M79609 Pain in unspecified limb: Secondary | ICD-10-CM

## 2021-07-04 MED ORDER — NEOMYCIN-POLYMYXIN-HC 3.5-10000-1 OT SUSP: OTIC | 0 refills | Status: DC

## 2021-07-04 NOTE — Progress Notes (Signed)
    Subjective:  Patient ID: William [name], male    DOB: September 26, 1941,  MRN: [id number]  Chief Complaint  Patient presents with   Nail Problem    Thick painful toenails, bilateral great toes    80 y.o. male returns for follow-up with the above complaint. History confirmed with patient.  Chemotherapy port is removed and he has no more chemotherapy planned.  Still having bladder resection in August.  Ready to have the toenails removed permanently  Objective:  Physical Exam: warm, good capillary refill, no trophic changes or ulcerative lesions, normal DP and PT pulses and normal sensory exam. B/L hallux yellowed and thickend  Assessment:   1. Pain due to onychomycosis of nail      Plan:  Patient was evaluated and treated and all questions answered.    Dystrophic Nail, bilaterally -Patient elects to proceed with minor surgery to remove dystrophic toenail today. Consent reviewed and signed by patient. -Dystrophic nail excised. See procedure note. -Educated on post-procedure care including soaking. Written instructions provided and reviewed. -Patient to follow up in 2 weeks for nail check.  Procedure: Excision of dystrophic toenail Location: Bilateral 1st toe  nail . Anesthesia: Lidocaine 1% plain; 1.5 mL and Marcaine 0.5% plain; 1.5 mL, digital block. Skin Prep: Betadine. Dressing: Silvadene; telfa; dry, sterile, compression dressing. Technique: Following skin prep, the toe was exsanguinated and a tourniquet was secured at the base of the toe. The affected nail was freed, and excised. Chemical matrixectomy was then performed with phenol and irrigated out with alcohol. The tourniquet was then removed and sterile dressing applied. Disposition: Patient tolerated procedure well. Patient to return in 2 weeks for follow-up.    Return in about 2 weeks (around 07/18/2021) for nail re-check.

## 2021-07-04 NOTE — Patient Instructions (Signed)

## 2021-07-18 ENCOUNTER — Other Ambulatory Visit: Payer: Self-pay

## 2021-07-18 ENCOUNTER — Ambulatory Visit: Payer: Medicare HMO | Admitting: Podiatry

## 2021-07-18 ENCOUNTER — Encounter: Payer: Self-pay | Admitting: Podiatry

## 2021-07-18 DIAGNOSIS — M79609 Pain in unspecified limb: Secondary | ICD-10-CM | POA: Diagnosis not present

## 2021-07-18 DIAGNOSIS — B351 Tinea unguium: Secondary | ICD-10-CM | POA: Diagnosis not present

## 2021-07-20 DIAGNOSIS — C670 Malignant neoplasm of trigone of bladder: Secondary | ICD-10-CM | POA: Diagnosis not present

## 2021-07-21 NOTE — Progress Notes (Signed)
    Subjective:  Patient ID: William [name], male    DOB: [birth year],  MRN: [id number]  Chief Complaint  Patient presents with   Ingrown Toenail    BIL great toe nail check    80 y.o. male returns for follow-up with the above complaint. History confirmed with patient.  Doing well not having much pain  Objective:  Physical Exam: warm, good capillary refill, no trophic changes or ulcerative lesions, normal DP and PT pulses and normal sensory exam.  Toes matricectomy sites are healing well with overlying scab no signs of infection Assessment:   1. Pain due to onychomycosis of nail       Plan:  Patient was evaluated and treated and all questions answered.    Dystrophic Nail, bilaterally -Doing quite well he can begin to leave these open to air.  Discussed further care for these including progression of healing.  Return to see me as needed for this   Return if symptoms worsen or fail to improve.

## 2021-08-03 IMAGING — CT CT ABD-PELV W/O CM
2 of 4 series · 14 of 36 positions shown, 17 images · non-contrast
Comparison: CT abdomen pelvis 01/20/2021.

CLINICAL DATA: Bladder cancer, chemotherapy in progress.

EXAM:
CT CHEST, ABDOMEN AND PELVIS WITHOUT CONTRAST
TECHNIQUE: Multidetector CT imaging of the chest, abdomen and pelvis was
performed following the standard protocol without IV contrast.

[Series 2: cap w/o · axial · non-contrast · 0.73mm/px · z∈[-623,-73]mm · 11 of 134 slices shown, 14 images]
[im 12/134  mediastinal]
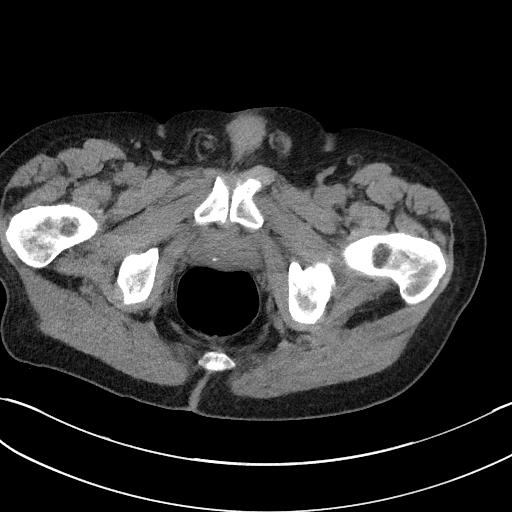
[im 12/134  lung]
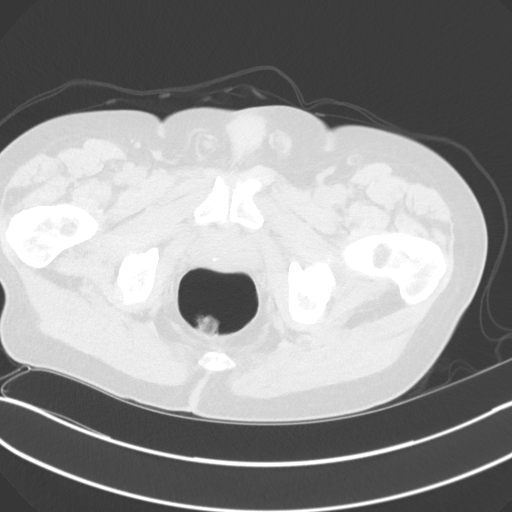
[im 23/134  lung]
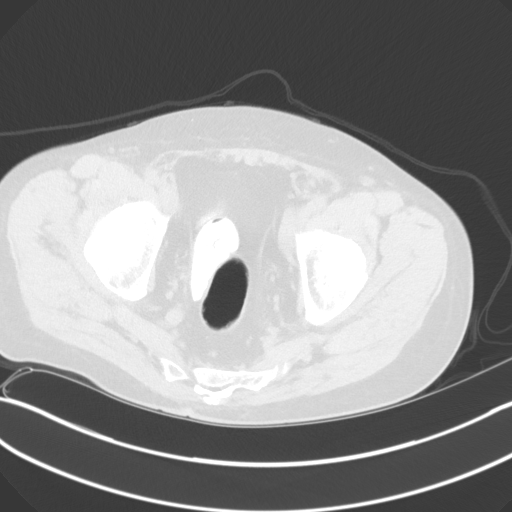
[im 34/134  lung]
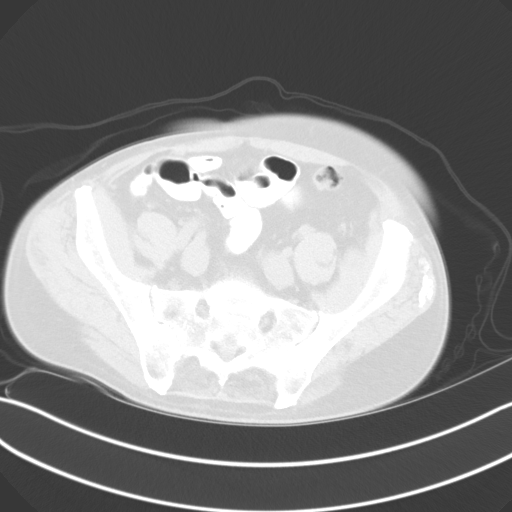
[im 45/134  lung]
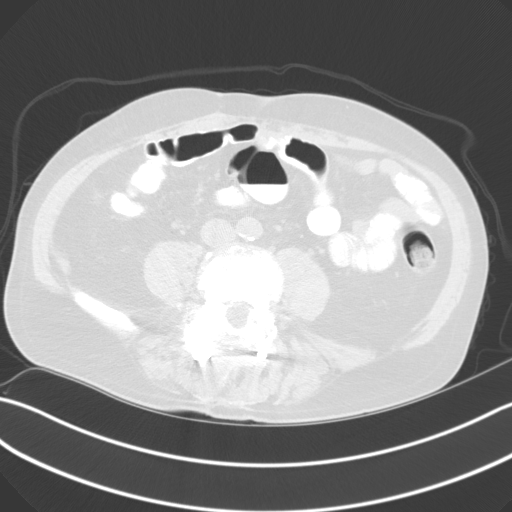
[im 56/134  mediastinal]
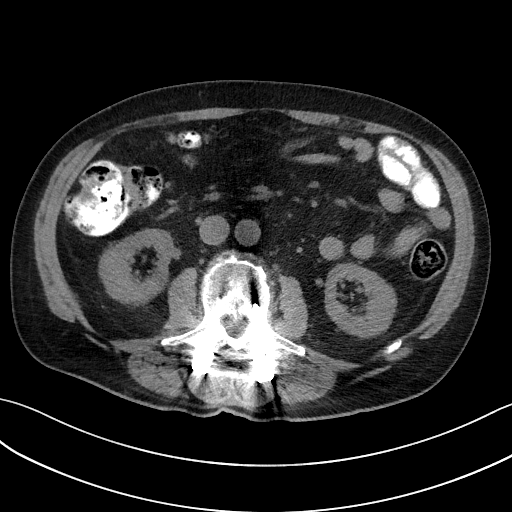
[im 56/134  lung]
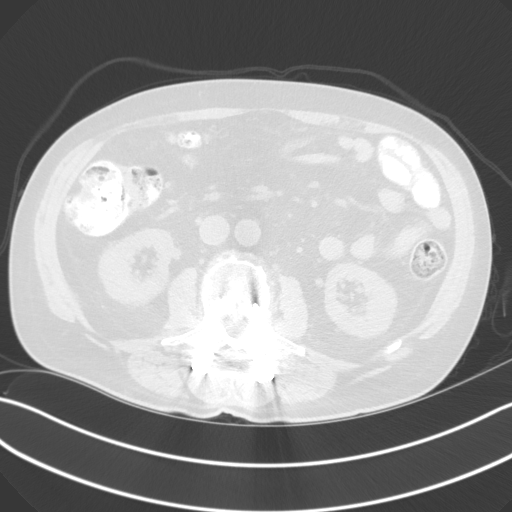
[im 67/134  lung]
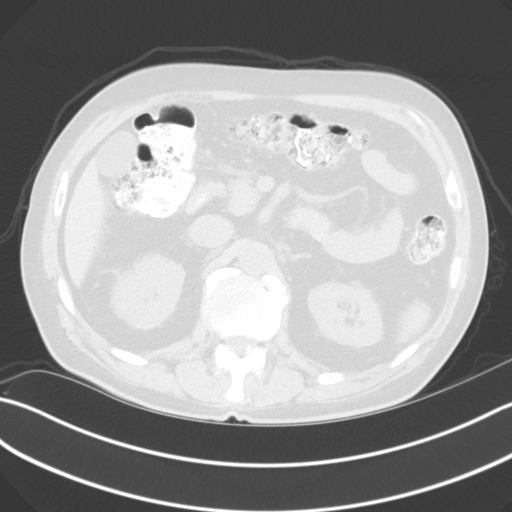
[im 78/134  lung]
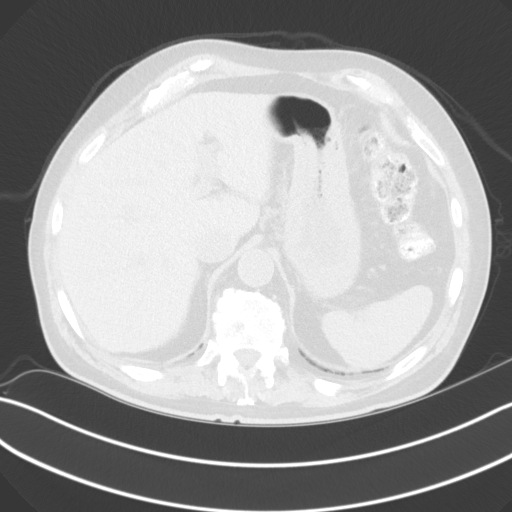
[im 89/134  lung]
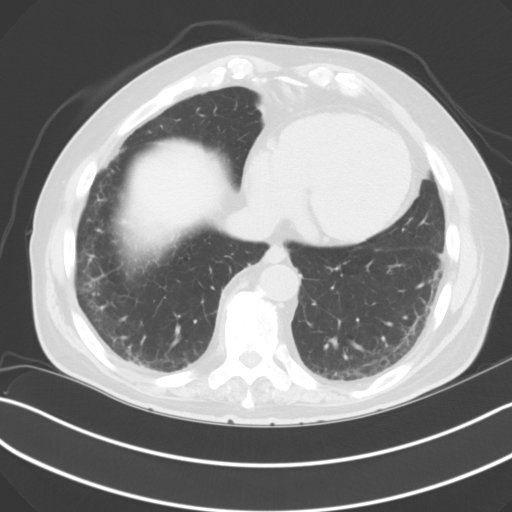
[im 100/134  mediastinal]
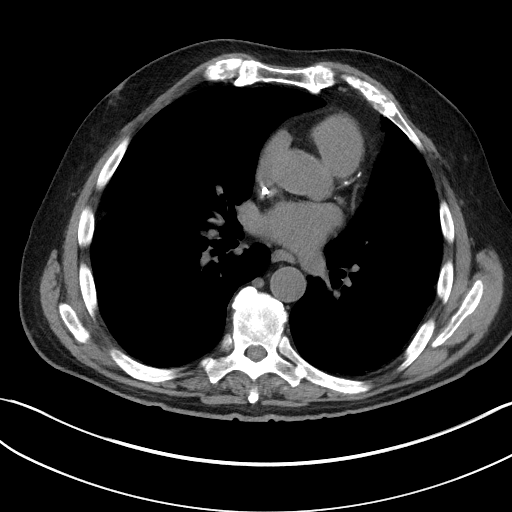
[im 100/134  lung]
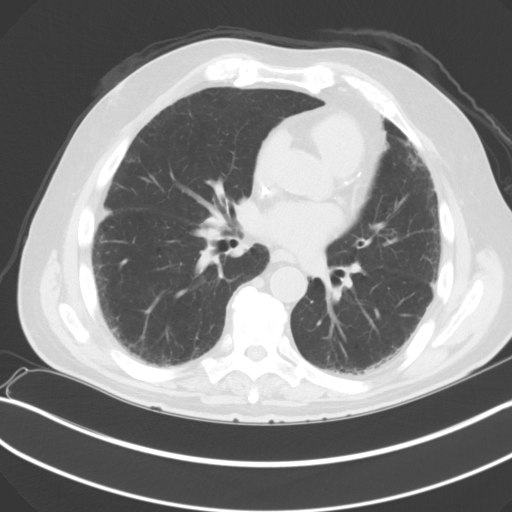
[im 111/134  lung]
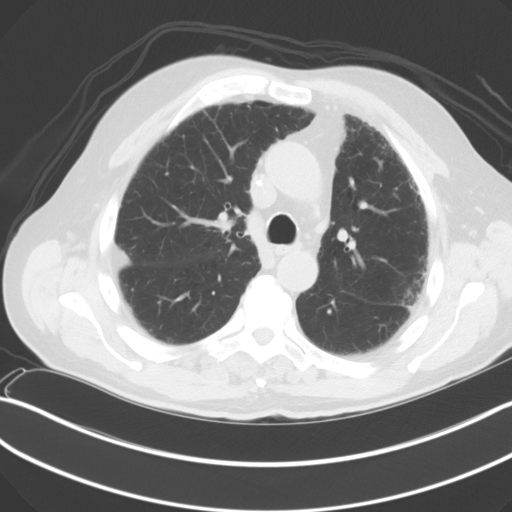
[im 122/134  lung]
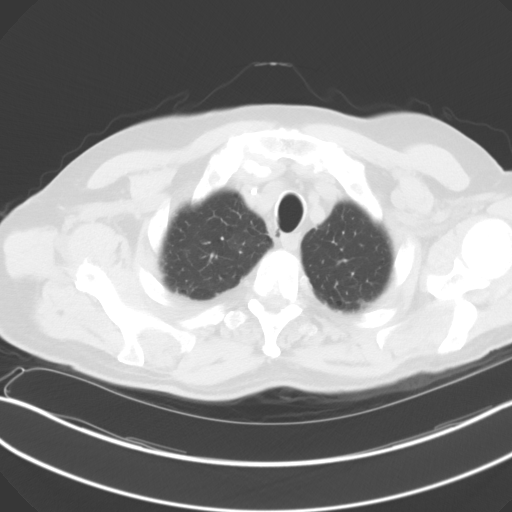

[Series 5: coronals · coronal · 0.82mm/px · 3 of 171 slices shown]
[im 35/171  lung]
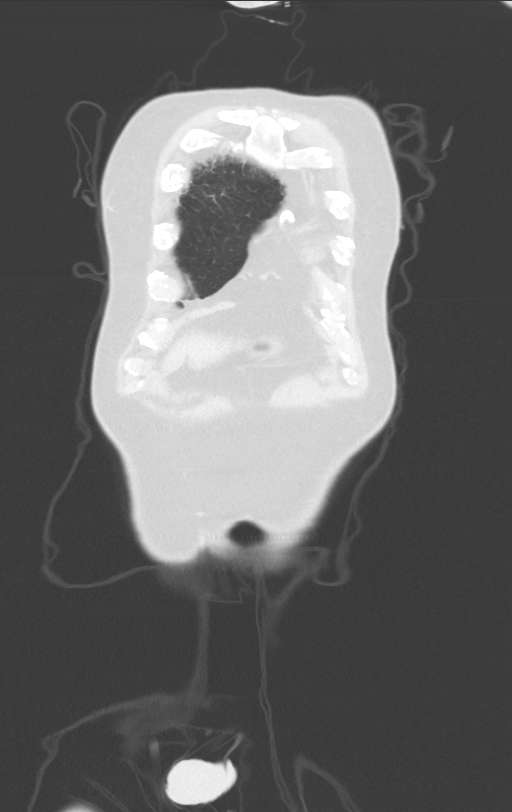
[im 69/171  lung]
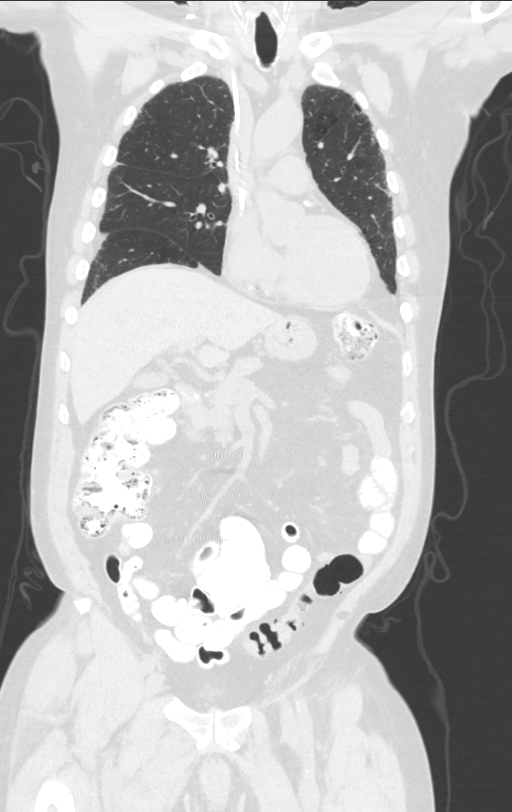
[im 103/171  lung]
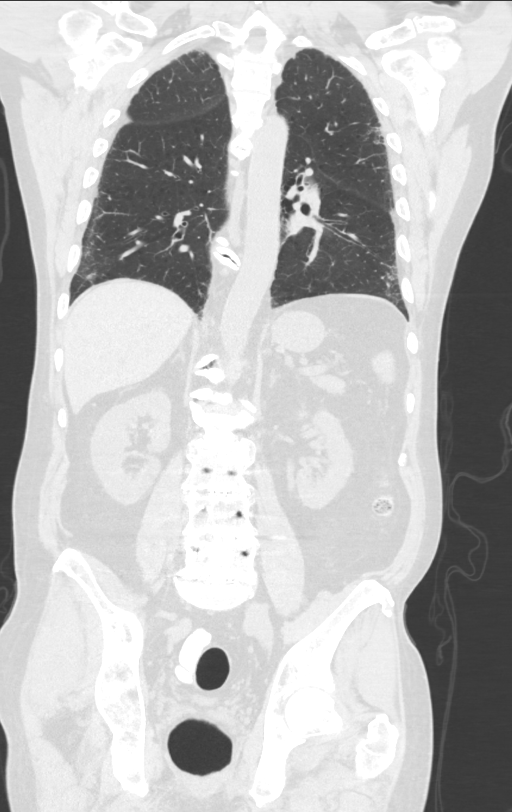

[14 of 36 positions shown; findings below may reference images not displayed]

FINDINGS: CT CHEST FINDINGS

Cardiovascular: Right IJ Port-A-Cath terminates in the high right
atrium. Atherosclerotic calcification of the aorta, aortic valve and
coronary arteries. Heart size normal. No pericardial effusion.

Mediastinum/Nodes: No pathologically enlarged mediastinal or
axillary lymph nodes. Hilar regions are difficult to definitively
evaluate without IV contrast. Esophagus is grossly unremarkable.

Lungs/Pleura: Biapical pleuroparenchymal scarring. Mild
centrilobular and paraseptal emphysema. Peripheral and basilar
predominant subpleural reticular densities, ground-glass and likely
traction bronchiolectasis. Suspect associated honeycombing. No
pleural fluid. Small tracheal diverticulum. Airway is otherwise
unremarkable.

Musculoskeletal: Degenerative changes in the spine. No worrisome
lytic or sclerotic lesions.

CT ABDOMEN PELVIS FINDINGS

Hepatobiliary: Liver and gallbladder are unremarkable. No biliary
ductal dilatation.

Pancreas: Negative.

Spleen: Negative.

Adrenals/Urinary Tract: Adrenal glands and right kidney are
unremarkable. Subcentimeter hyperattenuation lesion in the upper
pole left kidney, too small to characterize. Ureters are
decompressed. Bladder is low in volume.

Stomach/Bowel: Stomach, small bowel and colon are unremarkable.
Appendix is not visualized.

Vascular/Lymphatic: Atherosclerotic calcification of the aorta. No
pathologically enlarged lymph nodes.

Reproductive: Prostate is visualized.

Other: No free fluid. Small bilateral inguinal hernias contain fat.
Mesenteries and peritoneum are otherwise unremarkable.

Musculoskeletal: Postoperative and degenerative changes in the
spine. No worrisome lytic or sclerotic lesions.
IMPRESSION: 1. Bladder is poorly evaluated due to low volume and lack of IV
contrast. No evidence of metastatic disease.
2. Aortic atherosclerosis ([id number].E). Coronary artery
calcification.
3. Peripheral and basilar predominant subpleural reticular
densities, ground-glass, traction bronchiolectasis and probable
honeycombing, findings indicative of interstitial lung disease.
Usual interstitial pneumonitis is favored.
4.  Emphysema ([id number]).

## 2021-08-28 ENCOUNTER — Other Ambulatory Visit: Payer: Self-pay | Admitting: Family Medicine

## 2021-08-28 NOTE — Telephone Encounter (Signed)
Please let patient know I am refilling this medication, but he needs to schedule an appointment with me.   Thanks, Thales Knipple J Kamdyn Colborn, MD  

## 2021-08-29 NOTE — Telephone Encounter (Signed)
Appointment made.  .Danella Philson R Natthew Marlatt, CMA  

## 2021-09-14 ENCOUNTER — Ambulatory Visit: Payer: Medicare HMO | Admitting: Oncology

## 2021-09-14 ENCOUNTER — Other Ambulatory Visit: Payer: Medicare HMO

## 2021-09-19 DIAGNOSIS — C670 Malignant neoplasm of trigone of bladder: Secondary | ICD-10-CM | POA: Diagnosis not present

## 2021-09-26 ENCOUNTER — Encounter: Payer: Self-pay | Admitting: Family Medicine

## 2021-09-26 ENCOUNTER — Other Ambulatory Visit: Payer: Self-pay

## 2021-09-26 ENCOUNTER — Ambulatory Visit (INDEPENDENT_AMBULATORY_CARE_PROVIDER_SITE_OTHER): Payer: Medicare HMO | Admitting: Family Medicine

## 2021-09-26 VITALS — BP 135/73 | HR 88 | Wt 172.6 lb

## 2021-09-26 DIAGNOSIS — Z23 Encounter for immunization: Secondary | ICD-10-CM | POA: Diagnosis not present

## 2021-09-26 DIAGNOSIS — C679 Malignant neoplasm of bladder, unspecified: Secondary | ICD-10-CM | POA: Diagnosis not present

## 2021-09-26 DIAGNOSIS — E785 Hyperlipidemia, unspecified: Secondary | ICD-10-CM | POA: Diagnosis not present

## 2021-09-26 DIAGNOSIS — J449 Chronic obstructive pulmonary disease, unspecified: Secondary | ICD-10-CM | POA: Diagnosis not present

## 2021-09-26 MED ORDER — TETANUS-DIPHTH-ACELL PERTUSSIS 5-2.5-18.5 LF-MCG/0.5 IM SUSY: 0.5000 mL | PREFILLED_SYRINGE | Freq: Once | INTRAMUSCULAR | 0 refills | Status: AC

## 2021-09-26 NOTE — Progress Notes (Signed)
  Date of Visit: 09/26/2021   SUBJECTIVE:   HPI:  William [name] presents today for routine follow up  Bladder cancer - has completed chemo and is going to be scheduled for cystectomy. Followed by urologist [name]. He is overall doing well and staying active.  Asthma/COPD - using spiriva once per day with good effect. Overall breathing is doing well.  Hyperlipidemia - not on statin at present, declines lipid testing today  OBJECTIVE:   BP 135/73   Pulse 88   Wt 172 lb 9.6 oz (78.3 kg)   SpO2 94%   BMI 26.63 kg/m  Gen: no acute distress, pleasant, cooperative HEENT: normocephalic, atraumatic  Heart: regular rate and rhythm, no murmur Lungs: clear to auscultation bilaterally, normal work of breathing  Neuro: alert, speech normal, grossly nonfocal Ext: No appreciable lower extremity edema bilaterally   ASSESSMENT/PLAN:   Health maintenance:  -high dose flu shot given today -rx provided for Tdap, to get at his pharmacy -declines shingles and COVID vaccines, counseling provided  Asthma-COPD overlap syndrome ([place]) Well controlled with spiriva. Continue present regimen.  Bladder cancer [facility]) Planning for cystectomy with [name], s/p chemo  Hyperlipidemia Patient prefers to postpone checking lipids until after his urologic surgery.  FOLLOW UP: Follow up in 1 year with me for routine checkup, sooner if needed  William [name], [place]

## 2021-09-26 NOTE — Patient Instructions (Signed)
It was great to see you again today!  Best of luck with your surgery Flu shot today Take tetanus shot prescription to your pharmacy and get it there.  If you decide you want to get shingles vaccine you can get this at pharmacy too  [place] to follow up with me in 1 year, sooner if needed  Be well, Dr. Ardelia Mems

## 2021-09-29 ENCOUNTER — Other Ambulatory Visit: Payer: Self-pay | Admitting: Urology

## 2021-09-29 NOTE — Assessment & Plan Note (Signed)
Planning for cystectomy with [name], s/p chemo

## 2021-09-29 NOTE — Assessment & Plan Note (Signed)
Patient prefers to postpone checking lipids until after his urologic surgery.

## 2021-09-29 NOTE — Assessment & Plan Note (Signed)
Well controlled with spiriva. Continue present regimen.

## 2021-10-04 ENCOUNTER — Other Ambulatory Visit: Payer: Self-pay | Admitting: *Deleted

## 2021-10-04 DIAGNOSIS — C670 Malignant neoplasm of trigone of bladder: Secondary | ICD-10-CM

## 2021-10-11 ENCOUNTER — Telehealth: Payer: Self-pay | Admitting: Oncology

## 2021-10-11 NOTE — Telephone Encounter (Signed)
Reschedule per 10/11 in basket, pt has been called and confirmed appt

## 2021-10-24 ENCOUNTER — Observation Stay (HOSPITAL_COMMUNITY): Payer: Medicare HMO

## 2021-10-24 ENCOUNTER — Other Ambulatory Visit: Payer: Self-pay

## 2021-10-24 ENCOUNTER — Encounter (HOSPITAL_COMMUNITY): Payer: Self-pay | Admitting: Urology

## 2021-10-24 ENCOUNTER — Inpatient Hospital Stay (HOSPITAL_COMMUNITY)
Admission: AD | Admit: 2021-10-24 | Discharge: 2021-11-02 | DRG: 654 | Disposition: A | Discharge status: Home / Self Care | Payer: Medicare HMO | Attending: Urology | Admitting: Urology

## 2021-10-24 ENCOUNTER — Other Ambulatory Visit: Payer: Self-pay | Admitting: Urology

## 2021-10-24 DIAGNOSIS — Z87891 Personal history of nicotine dependence: Secondary | ICD-10-CM

## 2021-10-24 DIAGNOSIS — K9189 Other postprocedural complications and disorders of digestive system: Secondary | ICD-10-CM | POA: Diagnosis present

## 2021-10-24 DIAGNOSIS — Z20822 Contact with and (suspected) exposure to covid-19: Secondary | ICD-10-CM | POA: Diagnosis present

## 2021-10-24 DIAGNOSIS — Z8249 Family history of ischemic heart disease and other diseases of the circulatory system: Secondary | ICD-10-CM | POA: Diagnosis not present

## 2021-10-24 DIAGNOSIS — Z89022 Acquired absence of left finger(s): Secondary | ICD-10-CM | POA: Diagnosis not present

## 2021-10-24 DIAGNOSIS — Z823 Family history of stroke: Secondary | ICD-10-CM

## 2021-10-24 DIAGNOSIS — Z809 Family history of malignant neoplasm, unspecified: Secondary | ICD-10-CM

## 2021-10-24 DIAGNOSIS — Z833 Family history of diabetes mellitus: Secondary | ICD-10-CM | POA: Diagnosis not present

## 2021-10-24 DIAGNOSIS — Z981 Arthrodesis status: Secondary | ICD-10-CM

## 2021-10-24 DIAGNOSIS — C679 Malignant neoplasm of bladder, unspecified: Secondary | ICD-10-CM | POA: Diagnosis present

## 2021-10-24 DIAGNOSIS — N400 Enlarged prostate without lower urinary tract symptoms: Secondary | ICD-10-CM | POA: Diagnosis not present

## 2021-10-24 DIAGNOSIS — Z01818 Encounter for other preprocedural examination: Secondary | ICD-10-CM | POA: Diagnosis not present

## 2021-10-24 DIAGNOSIS — Z885 Allergy status to narcotic agent status: Secondary | ICD-10-CM | POA: Diagnosis not present

## 2021-10-24 DIAGNOSIS — K567 Ileus, unspecified: Secondary | ICD-10-CM | POA: Diagnosis present

## 2021-10-24 DIAGNOSIS — Z7951 Long term (current) use of inhaled steroids: Secondary | ICD-10-CM

## 2021-10-24 DIAGNOSIS — Z8551 Personal history of malignant neoplasm of bladder: Secondary | ICD-10-CM | POA: Diagnosis not present

## 2021-10-24 DIAGNOSIS — Z79899 Other long term (current) drug therapy: Secondary | ICD-10-CM

## 2021-10-24 DIAGNOSIS — E785 Hyperlipidemia, unspecified: Secondary | ICD-10-CM | POA: Diagnosis present

## 2021-10-24 DIAGNOSIS — C670 Malignant neoplasm of trigone of bladder: Secondary | ICD-10-CM | POA: Diagnosis not present

## 2021-10-24 DIAGNOSIS — N3090 Cystitis, unspecified without hematuria: Secondary | ICD-10-CM | POA: Diagnosis not present

## 2021-10-24 DIAGNOSIS — Z8616 Personal history of COVID-19: Secondary | ICD-10-CM

## 2021-10-24 DIAGNOSIS — K660 Peritoneal adhesions (postprocedural) (postinfection): Secondary | ICD-10-CM | POA: Diagnosis present

## 2021-10-24 DIAGNOSIS — R531 Weakness: Secondary | ICD-10-CM | POA: Diagnosis not present

## 2021-10-24 DIAGNOSIS — D494 Neoplasm of unspecified behavior of bladder: Secondary | ICD-10-CM | POA: Diagnosis not present

## 2021-10-24 LAB — COMPREHENSIVE METABOLIC PANEL
ALT: 16 U/L (ref 0–44)
AST: 21 U/L (ref 15–41)
Albumin: 3.8 g/dL (ref 3.5–5.0)
Alkaline Phosphatase: 56 U/L (ref 38–126)
Anion gap: 6 (ref 5–15)
BUN: 9 mg/dL (ref 8–23)
CO2: 27 mmol/L (ref 22–32)
Calcium: 8.9 mg/dL (ref 8.9–10.3)
Chloride: 104 mmol/L (ref 98–111)
Creatinine, Ser: 0.68 mg/dL (ref 0.61–1.24)
GFR, Estimated: >60 mL/min (ref >60)
Glucose, Bld: 85 mg/dL (ref 70–99)
Potassium: 3.9 mmol/L (ref 3.5–5.1)
Sodium: 137 mmol/L (ref 135–145)
Total Bilirubin: 0.8 mg/dL (ref 0.3–1.2)
Total Protein: 6.9 g/dL (ref 6.5–8.1)

## 2021-10-24 LAB — CBC
HCT: 41.2 % (ref 39.0–52.0)
Hemoglobin: 13.4 g/dL (ref 13.0–17.0)
MCH: 30.2 pg (ref 26.0–34.0)
MCHC: 32.5 g/dL (ref 30.0–36.0)
MCV: 93 fL (ref 80.0–100.0)
Platelets: 209 10*3/uL (ref 150–400)
RBC: 4.43 MIL/uL (ref 4.22–5.81)
RDW: 13.4 % (ref 11.5–15.5)
WBC: 5.6 10*3/uL (ref 4.0–10.5)
nRBC: 0 % (ref 0.0–0.2)

## 2021-10-24 LAB — SURGICAL PCR SCREEN
MRSA, PCR: NEGATIVE
Staphylococcus aureus: NEGATIVE

## 2021-10-24 LAB — SARS CORONAVIRUS 2 BY RT PCR (HOSPITAL ORDER, PERFORMED IN ~~LOC~~ HOSPITAL LAB): SARS Coronavirus 2: NEGATIVE

## 2021-10-24 LAB — PREPARE RBC (CROSSMATCH)

## 2021-10-24 MED ORDER — CHLORHEXIDINE GLUCONATE CLOTH 2 % EX PADS
6.0000 | MEDICATED_PAD | Freq: Once | CUTANEOUS | Status: AC
  Administered 2021-10-24: 6 via TOPICAL

## 2021-10-24 MED ORDER — METRONIDAZOLE 500 MG PO TABS
500.0000 mg | ORAL_TABLET | ORAL | Status: AC
Start: 2021-10-24 — End: 2021-10-24
  Administered 2021-10-24 (×2): 500 mg via ORAL
  Filled 2021-10-24 (×2): qty 1

## 2021-10-24 MED ORDER — PIPERACILLIN-TAZOBACTAM 3.375 G IVPB 30 MIN
3.3750 g | INTRAVENOUS | Status: AC
  Administered 2021-10-25: 3.375 g via INTRAVENOUS
  Filled 2021-10-24 (×2): qty 50

## 2021-10-24 MED ORDER — NEOMYCIN SULFATE 500 MG PO TABS
500.0000 mg | ORAL_TABLET | ORAL | Status: AC
  Administered 2021-10-24 (×2): 500 mg via ORAL
  Filled 2021-10-24 (×3): qty 1

## 2021-10-24 MED ORDER — TIZANIDINE HCL 4 MG PO TABS
4.0000 mg | ORAL_TABLET | Freq: Every day | ORAL | Status: DC
  Administered 2021-10-25 – 2021-11-01 (×8): 4 mg via ORAL
  Filled 2021-10-24 (×9): qty 1

## 2021-10-24 MED ORDER — SODIUM CHLORIDE 0.9 % IV SOLN: INTRAVENOUS | Status: DC

## 2021-10-24 MED ORDER — PEG 3350-KCL-NA BICARB-NACL 420 G PO SOLR
4000.0000 mL | Freq: Once | ORAL | Status: AC
  Administered 2021-10-24: 4000 mL via ORAL

## 2021-10-24 MED ORDER — GABAPENTIN 300 MG PO CAPS
900.0000 mg | ORAL_CAPSULE | Freq: Every day | ORAL | Status: DC
  Administered 2021-10-25 – 2021-11-01 (×8): 900 mg via ORAL
  Filled 2021-10-24 (×9): qty 3

## 2021-10-24 MED ORDER — TIOTROPIUM BROMIDE MONOHYDRATE 18 MCG IN CAPS
18.0000 ug | ORAL_CAPSULE | Freq: Every day | RESPIRATORY_TRACT | Status: DC
  Filled 2021-10-24: qty 5

## 2021-10-24 MED ORDER — UMECLIDINIUM BROMIDE 62.5 MCG/ACT IN AEPB
1.0000 | INHALATION_SPRAY | Freq: Every day | RESPIRATORY_TRACT | Status: DC
  Administered 2021-10-25 – 2021-11-02 (×9): 1 via RESPIRATORY_TRACT
  Filled 2021-10-24 (×4): qty 7

## 2021-10-24 MED ORDER — CHLORHEXIDINE GLUCONATE CLOTH 2 % EX PADS
6.0000 | MEDICATED_PAD | CUTANEOUS | Status: AC
  Administered 2021-10-25: 6 via TOPICAL

## 2021-10-24 MED ORDER — FLUTICASONE PROPIONATE 50 MCG/ACT NA SUSP
2.0000 | Freq: Every day | NASAL | Status: DC
  Administered 2021-10-27 – 2021-11-01 (×5): 2 via NASAL
  Filled 2021-10-24 (×2): qty 16

## 2021-10-24 MED ORDER — ALVIMOPAN 12 MG PO CAPS
12.0000 mg | ORAL_CAPSULE | ORAL | Status: AC
  Administered 2021-10-25: 12 mg via ORAL
  Filled 2021-10-24: qty 1

## 2021-10-24 NOTE — H&P (Signed)
William [name] is an 80 y.o. male.    Chief Complaint: Pre-OP Cystectomy  HPI:   1 - Muscle Invasive Bladder Cancer - T2G3 3cm left trigone area urothelial carcinoma (20%sq diff) by TURBT 01/2021. Non-con CT abd/pelvis without advanced disease. Retrogrades 01/2021 w/o upper tract lesions. Cancer very close to left UO, but not invading yet. Used to work in [place] remote [employer].   Underwent 4 cycles neo-adjuvant gem-cis under care of Dr. Alen Blew ending late 04/2021 with restaging imaging 05/2021 remaininc clincally localized, Cr <1.   PMH sig for OA, L spine surgery, open appy then bowel reseciton in teens (lower midline and RLQ scars). No ischemic CV disease / blood thinners. 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 Used to be avid runner. His PCP is Dorcas Mcmurray MD with [facility].   Today "William [name]" is seen as pre-op admission for bowel prep, stomal marking in preparation for major exitrpative surgery. Hgb 13's Cr <1.   Past Medical History:  Diagnosis Date   Bladder cancer ([place])    Pt says small tumor not cancer   Bleeding gums 11/11/2015   COVID 09/23/2020   back ache hurt all over x few days all symptoms reolved   Dyslipidemia (high LDL; low HDL)    Low back pain 12/27/2014   Medial epicondylitis of right elbow 12/13/2014   Numbness and tingling of right leg    Tinnitus    all the time   Toenail fungus    2 big toes   Wears glasses     Past Surgical History:  Procedure Laterality Date   bone chip removed from right heel  [place] Bilateral 02/08/2021   Procedure: CYSTOSCOPY WITH RETROGRADE PYELOGRAM;  Surgeon: [name], MD;  Location: [facility];  Service: Urology;  Laterality: Bilateral;   FINGER AMPUTATION     injured by table saw, left 4th and 5th fingers   IR IMAGING GUIDED PORT INSERTION  03/07/2021   IR REMOVAL TUN ACCESS W/ PORT W/O FL MOD SED  06/29/2021   KNEE  ARTHROSCOPY W/ MENISCAL REPAIR  2012 2014   left   KNEE ARTHROSCOPY W/ MENISCAL REPAIR  11/2011   left   LACERATION REPAIR  1962   forehead   LUMBAR DISC SURGERY  09/2007   bone spurs, stenosis, L3-4, 4-5    POSTERIOR LUMBAR FUSION 4 LEVEL N/A 10/13/2013   Procedure: Lumbar Two-Three, Lumbar Three-Four, Lumbar Four-Five, Lumbar Five-Sacral One posterior lumbar interbody fusion with interbody prosthesis posterior lateral arthrodesis and posterior segmental instrumentation;  Surgeon: [name], MD;  Location: [facility];  Service: Neurosurgery;  Laterality: N/A;  POSTERIOR LUMBAR FUSION 4 LEVEL   SKIN GRAFT     from right thigh to left forehead   SMALL INTESTINE SURGERY  2836   ileitis complications   TRANSURETHRAL RESECTION OF BLADDER TUMOR N/A 02/08/2021   Procedure: TRANSURETHRAL RESECTION OF BLADDER TUMOR (TURBT);  Surgeon: [name], MD;  Location: [facility];  Service: Urology;  Laterality: N/A;    Family History  Problem Relation Age of Onset   Stroke Mother    Heart disease Father    Stroke Father    Diabetes Father    Obesity Brother    Obesity Brother    Cancer Maternal Grandfather    Cancer Maternal Grandmother    Drug abuse Daughter  Social History:  reports that he quit smoking about 35 years ago. His smoking use included cigarettes. He has a 45.00 pack-year smoking history. He has never used smokeless tobacco. He reports that he does not currently use alcohol. He reports that he does not use drugs.  Allergies:  Allergies  Allergen Reactions   Hydrocodone Other (See Comments)    Causes constipation even with stool softner    Medications Prior to Admission  Medication Sig Dispense Refill   fluticasone (FLONASE) 50 MCG/ACT nasal spray Place 2 sprays into both nostrils daily. 16 g 6   gabapentin (NEURONTIN) 300 MG capsule TAKE 3 CAPSULES BY MOUTH AT BEDTIME 540 capsule 3   SPIRIVA HANDIHALER 18 MCG inhalation capsule PLACE 1 CAPSULE( 18  MCG TOTAL) INTO INHALER AND INHALE DAILY 30 capsule 4   tiZANidine (ZANAFLEX) 4 MG tablet Take 4 mg by mouth at bedtime.     b complex vitamins tablet Take 1 tablet by mouth daily. (Patient not taking: Reported on 10/23/2021)     Iron-Vitamins (GERITOL) LIQD Take 5 mLs by mouth. (Patient not taking: Reported on 10/23/2021)     Multiple Vitamin (MULTIVITAMIN WITH MINERALS) TABS tablet Take 1 tablet by mouth daily. (Patient not taking: Reported on 10/23/2021)     OMEGA-3 FATTY ACIDS PO Take by mouth. (Patient not taking: Reported on 10/23/2021)      No results found for this or any previous visit (from the past 48 hour(s)). No results found.  Review of Systems  Constitutional:  Negative for chills and fever.  All other systems reviewed and are negative.  Blood pressure 133/84, pulse 63, temperature (!) 97.4 F (36.3 C), temperature source Oral, resp. rate 16, height 5\' 7"  (1.702 m), weight 77.7 kg, SpO2 97 %. Physical Exam Vitals reviewed.  HENT:     Head: Normocephalic.     Nose: Nose normal.  Eyes:     Pupils: Pupils are equal, round, and reactive to light.  Cardiovascular:     Rate and Rhythm: Normal rate.  Abdominal:     General: Abdomen is flat.     Comments: Prior scars w/o hernias.   Genitourinary:    Comments: No CVAT at present.  Musculoskeletal:        General: Normal range of motion.     Cervical back: Normal range of motion.  Skin:    General: Skin is warm.  Neurological:     General: No focal deficit present.     Mental Status: He is alert.  Psychiatric:        Mood and Affect: Mood normal.     Assessment/Plan Proceed as planned with cysto/ICG, robotic cystoprostatectomy with conduit diversion, node dissection for muscle invasive bladder cancer. Risks (including bowel injury given surgical history), benefits, alternatives, expected peri-op course discussed extensively previously over many visits and reiterated today.   [name], MD 10/24/2021, 5:10  PM

## 2021-10-24 NOTE — Discharge Instructions (Signed)

## 2021-10-24 NOTE — Anesthesia Preprocedure Evaluation (Addendum)
Anesthesia Evaluation  Patient identified by MRN, date of birth, ID band Patient awake    Reviewed: Allergy & Precautions, NPO status , Patient's Chart, lab work & pertinent test results  Airway Mallampati: II  TM Distance: >3 FB Neck ROM: Full    Dental no notable dental hx. (+) Edentulous Upper, Dental Advisory Given,    Pulmonary COPD,  COPD inhaler, former smoker,    Pulmonary exam normal breath sounds clear to auscultation       Cardiovascular Exercise Tolerance: Good negative cardio ROS Normal cardiovascular exam Rhythm:Regular Rate:Normal  2018 TTe Ef 60-65%   Neuro/Psych  Neuromuscular disease negative psych ROS   GI/Hepatic negative GI ROS, Neg liver ROS,   Endo/Other    Renal/GU Lab Results      Component                Value               Date                      CREATININE               0.76                06/29/2021                BUN                      15                  06/29/2021                NA                       139                 06/29/2021                K                        4.4                 06/29/2021                CL                       105                 06/29/2021                CO2                      27                  06/29/2021              Hx of bladder CA    Musculoskeletal   Abdominal   Peds  Hematology Lab Results      Component                Value               Date                      WBC  6.0                 06/29/2021                HGB                      12.3 (L)            06/29/2021                HCT                      38.9 (L)            06/29/2021                MCV                      102.4 (H)           06/29/2021                PLT                      237                 06/29/2021              Anesthesia Other Findings All: hydrocodone  Reproductive/Obstetrics negative OB ROS                             Anesthesia Physical Anesthesia Plan  ASA: 3  Anesthesia Plan: General   Post-op Pain Management:    Induction: Intravenous  PONV Risk Score and Plan: 3 and Treatment may vary due to age or medical condition and Ondansetron  Airway Management Planned: Oral ETT  Additional Equipment: None  Intra-op Plan:   Post-operative Plan: Extubation in OR  Informed Consent: I have reviewed the patients History and Physical, chart, labs and discussed the procedure including the risks, benefits and alternatives for the proposed anesthesia with the patient or authorized representative who has indicated his/her understanding and acceptance.     Dental advisory given  Plan Discussed with: CRNA  Anesthesia Plan Comments: (GA w lidocaine infusion)       Anesthesia Quick Evaluation

## 2021-10-24 NOTE — Progress Notes (Signed)
Per secure chat from Lia Foyer: @ [phone] are out of Neomycin- waiting on med to be delivered from [facility] & will tube as soon as available.  Sorry for delay. " Medication received and given 2310. Pt did not want to take his Zanaflex or gabapentin fearing it would make him too drowsy to make it to the bathroom

## 2021-10-25 ENCOUNTER — Observation Stay (HOSPITAL_COMMUNITY): Payer: Medicare HMO | Admitting: Anesthesiology

## 2021-10-25 ENCOUNTER — Encounter (HOSPITAL_COMMUNITY): Payer: Self-pay | Admitting: Urology

## 2021-10-25 ENCOUNTER — Encounter (HOSPITAL_COMMUNITY)
Admission: AD | Disposition: A | Discharge status: Home / Self Care | Payer: Self-pay | Source: Home / Self Care | Attending: Urology

## 2021-10-25 DIAGNOSIS — K660 Peritoneal adhesions (postprocedural) (postinfection): Secondary | ICD-10-CM | POA: Diagnosis present

## 2021-10-25 DIAGNOSIS — K567 Ileus, unspecified: Secondary | ICD-10-CM | POA: Diagnosis present

## 2021-10-25 DIAGNOSIS — R531 Weakness: Secondary | ICD-10-CM | POA: Diagnosis not present

## 2021-10-25 DIAGNOSIS — Z833 Family history of diabetes mellitus: Secondary | ICD-10-CM | POA: Diagnosis not present

## 2021-10-25 DIAGNOSIS — Z8249 Family history of ischemic heart disease and other diseases of the circulatory system: Secondary | ICD-10-CM | POA: Diagnosis not present

## 2021-10-25 DIAGNOSIS — Z823 Family history of stroke: Secondary | ICD-10-CM | POA: Diagnosis not present

## 2021-10-25 DIAGNOSIS — E785 Hyperlipidemia, unspecified: Secondary | ICD-10-CM | POA: Diagnosis present

## 2021-10-25 DIAGNOSIS — Z8616 Personal history of COVID-19: Secondary | ICD-10-CM | POA: Diagnosis not present

## 2021-10-25 DIAGNOSIS — Z87891 Personal history of nicotine dependence: Secondary | ICD-10-CM | POA: Diagnosis not present

## 2021-10-25 DIAGNOSIS — Z885 Allergy status to narcotic agent status: Secondary | ICD-10-CM | POA: Diagnosis not present

## 2021-10-25 DIAGNOSIS — Z809 Family history of malignant neoplasm, unspecified: Secondary | ICD-10-CM | POA: Diagnosis not present

## 2021-10-25 DIAGNOSIS — Z981 Arthrodesis status: Secondary | ICD-10-CM | POA: Diagnosis not present

## 2021-10-25 DIAGNOSIS — D494 Neoplasm of unspecified behavior of bladder: Secondary | ICD-10-CM | POA: Diagnosis not present

## 2021-10-25 DIAGNOSIS — N3090 Cystitis, unspecified without hematuria: Secondary | ICD-10-CM | POA: Diagnosis not present

## 2021-10-25 DIAGNOSIS — C679 Malignant neoplasm of bladder, unspecified: Secondary | ICD-10-CM | POA: Diagnosis present

## 2021-10-25 DIAGNOSIS — Z8551 Personal history of malignant neoplasm of bladder: Secondary | ICD-10-CM | POA: Diagnosis not present

## 2021-10-25 DIAGNOSIS — K9189 Other postprocedural complications and disorders of digestive system: Secondary | ICD-10-CM | POA: Diagnosis present

## 2021-10-25 DIAGNOSIS — Z20822 Contact with and (suspected) exposure to covid-19: Secondary | ICD-10-CM | POA: Diagnosis present

## 2021-10-25 DIAGNOSIS — N400 Enlarged prostate without lower urinary tract symptoms: Secondary | ICD-10-CM | POA: Diagnosis not present

## 2021-10-25 DIAGNOSIS — Z7951 Long term (current) use of inhaled steroids: Secondary | ICD-10-CM | POA: Diagnosis not present

## 2021-10-25 DIAGNOSIS — Z89022 Acquired absence of left finger(s): Secondary | ICD-10-CM | POA: Diagnosis not present

## 2021-10-25 DIAGNOSIS — Z79899 Other long term (current) drug therapy: Secondary | ICD-10-CM | POA: Diagnosis not present

## 2021-10-25 HISTORY — PX: CYSTOSCOPY WITH INJECTION: SHX1424

## 2021-10-25 HISTORY — PX: ROBOT ASSISTED LAPAROSCOPIC COMPLETE CYSTECT ILEAL CONDUIT: SHX5139

## 2021-10-25 HISTORY — PX: LYMPH NODE DISSECTION: SHX5087

## 2021-10-25 HISTORY — PX: LAPAROSCOPIC LYSIS OF ADHESIONS: SHX5905

## 2021-10-25 LAB — BASIC METABOLIC PANEL
Anion gap: 6 (ref 5–15)
BUN: 10 mg/dL (ref 8–23)
CO2: 25 mmol/L (ref 22–32)
Calcium: 8.2 mg/dL — ABNORMAL LOW (ref 8.9–10.3)
Chloride: 104 mmol/L (ref 98–111)
Creatinine, Ser: 0.88 mg/dL (ref 0.61–1.24)
GFR, Estimated: >60 mL/min (ref >60)
Glucose, Bld: 162 mg/dL — ABNORMAL HIGH (ref 70–99)
Potassium: 3.8 mmol/L (ref 3.5–5.1)
Sodium: 135 mmol/L (ref 135–145)

## 2021-10-25 LAB — HEMOGLOBIN AND HEMATOCRIT, BLOOD
HCT: 39.7 % (ref 39.0–52.0)
Hemoglobin: 12.8 g/dL — ABNORMAL LOW (ref 13.0–17.0)

## 2021-10-25 SURGERY — CYSTECTOMY, ROBOT-ASSISTED, WITH ILEAL CONDUIT CREATION
Anesthesia: General

## 2021-10-25 MED ORDER — ONDANSETRON HCL 4 MG/2ML IJ SOLN
4.0000 mg | Freq: Once | INTRAMUSCULAR | Status: DC | PRN
Start: 2021-10-25 — End: 2021-10-25

## 2021-10-25 MED ORDER — ACETAMINOPHEN 10 MG/ML IV SOLN
1000.0000 mg | Freq: Once | INTRAVENOUS | Status: DC | PRN
  Administered 2021-10-25: 1000 mg via INTRAVENOUS

## 2021-10-25 MED ORDER — PROPOFOL 10 MG/ML IV BOLUS
INTRAVENOUS | Status: AC
  Filled 2021-10-25: qty 20

## 2021-10-25 MED ORDER — LACTATED RINGERS IR SOLN
Status: DC | PRN
  Administered 2021-10-25: 1000 mL

## 2021-10-25 MED ORDER — PROPOFOL 10 MG/ML IV BOLUS
INTRAVENOUS | Status: DC | PRN
Start: 2021-10-25 — End: 2021-10-25
  Administered 2021-10-25: 120 mg via INTRAVENOUS

## 2021-10-25 MED ORDER — FENTANYL CITRATE (PF) 100 MCG/2ML IJ SOLN
INTRAMUSCULAR | Status: DC | PRN
  Administered 2021-10-25 (×6): 50 ug via INTRAVENOUS

## 2021-10-25 MED ORDER — MORPHINE SULFATE (PF) 4 MG/ML IV SOLN
INTRAVENOUS | Status: AC
  Filled 2021-10-25: qty 1

## 2021-10-25 MED ORDER — MORPHINE SULFATE (PF) 4 MG/ML IV SOLN
1.0000 mg | INTRAVENOUS | Status: DC | PRN
  Administered 2021-10-25: 2 mg via INTRAVENOUS
  Administered 2021-10-25: 1 mg via INTRAVENOUS

## 2021-10-25 MED ORDER — DEXAMETHASONE SODIUM PHOSPHATE 10 MG/ML IJ SOLN
INTRAMUSCULAR | Status: DC | PRN
  Administered 2021-10-25: 5 mg via INTRAVENOUS

## 2021-10-25 MED ORDER — CHLORHEXIDINE GLUCONATE 0.12 % MT SOLN
15.0000 mL | Freq: Once | OROMUCOSAL | Status: AC
  Administered 2021-10-25: 15 mL via OROMUCOSAL

## 2021-10-25 MED ORDER — ACETAMINOPHEN 10 MG/ML IV SOLN
INTRAVENOUS | Status: AC
  Filled 2021-10-25: qty 100

## 2021-10-25 MED ORDER — FENTANYL CITRATE PF 50 MCG/ML IJ SOSY
PREFILLED_SYRINGE | INTRAMUSCULAR | Status: AC
  Filled 2021-10-25: qty 1

## 2021-10-25 MED ORDER — HYDROMORPHONE HCL 1 MG/ML IJ SOLN
2.0000 mg | INTRAMUSCULAR | Status: DC | PRN
  Administered 2021-10-25 – 2021-10-26 (×2): 1 mg via INTRAVENOUS
  Administered 2021-10-26 – 2021-10-27 (×3): 2 mg via INTRAVENOUS
  Administered 2021-10-27: 1 mg via INTRAVENOUS
  Administered 2021-10-28 – 2021-11-01 (×4): 2 mg via INTRAVENOUS
  Filled 2021-10-25 (×10): qty 2

## 2021-10-25 MED ORDER — SODIUM CHLORIDE (PF) 0.9 % IJ SOLN
INTRAMUSCULAR | Status: AC
  Filled 2021-10-25: qty 30

## 2021-10-25 MED ORDER — ONDANSETRON HCL 4 MG/2ML IJ SOLN
INTRAMUSCULAR | Status: DC | PRN
  Administered 2021-10-25: 4 mg via INTRAVENOUS

## 2021-10-25 MED ORDER — PIPERACILLIN-TAZOBACTAM 3.375 G IVPB: 3.3750 g | Freq: Three times a day (TID) | INTRAVENOUS | Status: DC

## 2021-10-25 MED ORDER — PIPERACILLIN-TAZOBACTAM 3.375 G IVPB
3.3750 g | Freq: Three times a day (TID) | INTRAVENOUS | Status: AC
Start: 2021-10-25 — End: 2021-10-26
  Administered 2021-10-25 – 2021-10-26 (×3): 3.375 g via INTRAVENOUS
  Filled 2021-10-25 (×3): qty 50

## 2021-10-25 MED ORDER — DEXMEDETOMIDINE BOLUS VIA INFUSION
0.1030 ug/kg | Freq: Once | INTRAVENOUS | Status: AC
  Administered 2021-10-25: 8 ug via INTRAVENOUS
  Filled 2021-10-25: qty 8

## 2021-10-25 MED ORDER — WATER FOR IRRIGATION, STERILE IR SOLN
Status: DC | PRN
  Administered 2021-10-25: 3000 mL

## 2021-10-25 MED ORDER — BUPIVACAINE LIPOSOME 1.3 % IJ SUSP
INTRAMUSCULAR | Status: AC
  Filled 2021-10-25: qty 20

## 2021-10-25 MED ORDER — LIP MEDEX EX OINT
TOPICAL_OINTMENT | CUTANEOUS | Status: AC
  Administered 2021-10-25: 75
  Filled 2021-10-25: qty 7

## 2021-10-25 MED ORDER — FENTANYL CITRATE PF 50 MCG/ML IJ SOSY
25.0000 ug | PREFILLED_SYRINGE | INTRAMUSCULAR | Status: DC | PRN
  Administered 2021-10-25 (×2): 50 ug via INTRAVENOUS

## 2021-10-25 MED ORDER — LACTATED RINGERS IV SOLN: INTRAVENOUS | Status: DC | PRN

## 2021-10-25 MED ORDER — OXYCODONE HCL 5 MG PO TABS
5.0000 mg | ORAL_TABLET | ORAL | Status: DC | PRN
  Administered 2021-10-28 – 2021-11-02 (×10): 5 mg via ORAL
  Filled 2021-10-25 (×11): qty 1

## 2021-10-25 MED ORDER — LIDOCAINE 2% (20 MG/ML) 5 ML SYRINGE
INTRAMUSCULAR | Status: DC | PRN
  Administered 2021-10-25: 100 mg via INTRAVENOUS

## 2021-10-25 MED ORDER — BUPIVACAINE LIPOSOME 1.3 % IJ SUSP
INTRAMUSCULAR | Status: DC | PRN
  Administered 2021-10-25: 20 mL

## 2021-10-25 MED ORDER — SUGAMMADEX SODIUM 200 MG/2ML IV SOLN
INTRAVENOUS | Status: DC | PRN
  Administered 2021-10-25: 200 mg via INTRAVENOUS

## 2021-10-25 MED ORDER — ONABOTULINUMTOXINA 100 UNITS IJ SOLR
INTRAMUSCULAR | Status: AC
  Filled 2021-10-25: qty 100

## 2021-10-25 MED ORDER — ROCURONIUM BROMIDE 10 MG/ML (PF) SYRINGE
PREFILLED_SYRINGE | INTRAVENOUS | Status: DC | PRN
  Administered 2021-10-25 (×3): 10 mg via INTRAVENOUS
  Administered 2021-10-25 (×2): 20 mg via INTRAVENOUS
  Administered 2021-10-25: 70 mg via INTRAVENOUS
  Administered 2021-10-25: 20 mg via INTRAVENOUS

## 2021-10-25 MED ORDER — LACTATED RINGERS IV SOLN: INTRAVENOUS | Status: DC

## 2021-10-25 MED ORDER — PHENYLEPHRINE 40 MCG/ML (10ML) SYRINGE FOR IV PUSH (FOR BLOOD PRESSURE SUPPORT)
PREFILLED_SYRINGE | INTRAVENOUS | Status: DC | PRN
  Administered 2021-10-25 (×4): 80 ug via INTRAVENOUS

## 2021-10-25 MED ORDER — ACETAMINOPHEN 500 MG PO TABS
1000.0000 mg | ORAL_TABLET | Freq: Four times a day (QID) | ORAL | Status: DC
  Administered 2021-10-25 – 2021-10-27 (×7): 1000 mg via ORAL
  Filled 2021-10-25 (×7): qty 2

## 2021-10-25 MED ORDER — FENTANYL CITRATE (PF) 100 MCG/2ML IJ SOLN
INTRAMUSCULAR | Status: AC
  Filled 2021-10-25: qty 2

## 2021-10-25 MED ORDER — SODIUM CHLORIDE (PF) 0.9 % IJ SOLN
INTRAMUSCULAR | Status: DC | PRN
  Administered 2021-10-25: 20 mL

## 2021-10-25 MED ORDER — FENTANYL CITRATE (PF) 250 MCG/5ML IJ SOLN
INTRAMUSCULAR | Status: AC
  Filled 2021-10-25: qty 5

## 2021-10-25 SURGICAL SUPPLY — 104 items
ADH SKN CLS APL DERMABOND .7 (GAUZE/BANDAGES/DRESSINGS) ×4
AGENT HMST KT MTR STRL THRMB (HEMOSTASIS)
APL ESCP 34 STRL LF DISP (HEMOSTASIS)
APL PRP STRL LF DISP 70% ISPRP (MISCELLANEOUS) ×2
APL SKNCLS STERI-STRIP NONHPOA (GAUZE/BANDAGES/DRESSINGS)
APL SWBSTK 6 STRL LF DISP (MISCELLANEOUS) ×2
APPLICATOR COTTON TIP 6 STRL (MISCELLANEOUS) ×2 IMPLANT
APPLICATOR COTTON TIP 6IN STRL (MISCELLANEOUS) ×3
APPLICATOR SURGIFLO ENDO (HEMOSTASIS) IMPLANT
BAG COUNTER SPONGE SURGICOUNT (BAG) ×1 IMPLANT
BAG LAPAROSCOPIC 12 15 PORT 16 (BASKET) ×2 IMPLANT
BAG RETRIEVAL 12/15 (BASKET) ×3
BAG SPNG CNTER NS LX DISP (BAG) ×2
BENZOIN TINCTURE PRP APPL 2/3 (GAUZE/BANDAGES/DRESSINGS) IMPLANT
BLADE SURG SZ10 CARB STEEL (BLADE) ×1 IMPLANT
CATH SILICONE 5CC 18FR (INSTRUMENTS) ×3 IMPLANT
CELLS DAT CNTRL 66122 CELL SVR (MISCELLANEOUS) ×2 IMPLANT
CHLORAPREP W/TINT 26 (MISCELLANEOUS) ×3 IMPLANT
CLIP LIGATING HEM O LOK PURPLE (MISCELLANEOUS) ×1 IMPLANT
CLIP LIGATING HEMO LOK XL GOLD (MISCELLANEOUS) ×6 IMPLANT
CLIP LIGATING HEMO O LOK GREEN (MISCELLANEOUS) ×9 IMPLANT
CLOTH BEACON ORANGE TIMEOUT ST (SAFETY) ×3 IMPLANT
CNTNR URN SCR LID CUP LEK RST (MISCELLANEOUS) ×2 IMPLANT
CONT SPEC 4OZ STRL OR WHT (MISCELLANEOUS) ×3
COVER SURGICAL LIGHT HANDLE (MISCELLANEOUS) ×3 IMPLANT
COVER TIP SHEARS 8 DVNC (MISCELLANEOUS) ×2 IMPLANT
COVER TIP SHEARS 8MM DA VINCI (MISCELLANEOUS) ×3
DECANTER SPIKE VIAL GLASS SM (MISCELLANEOUS) ×3 IMPLANT
DERMABOND ADVANCED (GAUZE/BANDAGES/DRESSINGS) ×2
DERMABOND ADVANCED .7 DNX12 (GAUZE/BANDAGES/DRESSINGS) ×4 IMPLANT
DRAPE ARM DVNC X/XI (DISPOSABLE) ×8 IMPLANT
DRAPE COLUMN DVNC XI (DISPOSABLE) ×2 IMPLANT
DRAPE DA VINCI XI ARM (DISPOSABLE) ×12
DRAPE DA VINCI XI COLUMN (DISPOSABLE) ×3
DRAPE SURG IRRIG POUCH 19X23 (DRAPES) ×3 IMPLANT
DRSG OPSITE POSTOP 4X12 (GAUZE/BANDAGES/DRESSINGS) ×1 IMPLANT
DRSG TEGADERM 4X4.75 (GAUZE/BANDAGES/DRESSINGS) IMPLANT
ELECT PENCIL ROCKER SW 15FT (MISCELLANEOUS) ×3 IMPLANT
ELECT REM PT RETURN 15FT ADLT (MISCELLANEOUS) ×3 IMPLANT
EVACUATOR SILICONE 100CC (DRAIN) ×1 IMPLANT
GAUZE 4X4 16PLY ~~LOC~~+RFID DBL (SPONGE) ×1 IMPLANT
GLOVE SURG ENC MOIS LTX SZ6.5 (GLOVE) ×6 IMPLANT
GLOVE SURG ENC TEXT LTX SZ7.5 (GLOVE) ×9 IMPLANT
GLOVE SURG UNDER POLY LF SZ7.5 (GLOVE) ×1 IMPLANT
GOWN STRL REUS W/TWL LRG LVL3 (GOWN DISPOSABLE) ×15 IMPLANT
IRRIG SUCT STRYKERFLOW 2 WTIP (MISCELLANEOUS) ×3
IRRIGATION SUCT STRKRFLW 2 WTP (MISCELLANEOUS) ×2 IMPLANT
KIT PROCEDURE DA VINCI SI (MISCELLANEOUS) ×3
KIT PROCEDURE DVNC SI (MISCELLANEOUS) IMPLANT
KIT TURNOVER KIT A (KITS) ×1 IMPLANT
LOOP VESSEL MAXI BLUE (MISCELLANEOUS) ×3 IMPLANT
MANIFOLD NEPTUNE II (INSTRUMENTS) ×3 IMPLANT
NDL ASPIRATION 22 (NEEDLE) ×2 IMPLANT
NDL INSUFFLATION 14GA 120MM (NEEDLE) ×2 IMPLANT
NEEDLE ASPIRATION 22 (NEEDLE) ×3 IMPLANT
NEEDLE INSUFFLATION 14GA 120MM (NEEDLE) ×3 IMPLANT
PACK CYSTO (CUSTOM PROCEDURE TRAY) ×3 IMPLANT
PACK ROBOT UROLOGY CUSTOM (CUSTOM PROCEDURE TRAY) ×3 IMPLANT
PAD POSITIONING PINK XL (MISCELLANEOUS) ×3 IMPLANT
PORT ACCESS TROCAR AIRSEAL 12 (TROCAR) ×2 IMPLANT
PORT ACCESS TROCAR AIRSEAL 5M (TROCAR) ×1
RELOAD STAPLE 60 2.6 WHT THN (STAPLE) ×6 IMPLANT
RELOAD STAPLE 60 4.1 GRN THCK (STAPLE) ×6 IMPLANT
RELOAD STAPLER GREEN 60MM (STAPLE) ×14 IMPLANT
RELOAD STAPLER WHITE 60MM (STAPLE) ×20 IMPLANT
RETRACTOR WND ALEXIS 18 MED (MISCELLANEOUS) ×2 IMPLANT
RTRCTR WOUND ALEXIS 18CM MED (MISCELLANEOUS) ×3
SCISSORS LAP 5X35 DISP (ENDOMECHANICALS) ×1 IMPLANT
SEAL CANN UNIV 5-8 DVNC XI (MISCELLANEOUS) ×8 IMPLANT
SEAL XI 5MM-8MM UNIVERSAL (MISCELLANEOUS) ×12
SET TRI-LUMEN FLTR TB AIRSEAL (TUBING) ×3 IMPLANT
SOLUTION ELECTROLUBE (MISCELLANEOUS) ×3 IMPLANT
SPONGE T-LAP 18X18 ~~LOC~~+RFID (SPONGE) ×6 IMPLANT
SPONGE T-LAP 4X18 ~~LOC~~+RFID (SPONGE) ×3 IMPLANT
STAPLER ECHELON LONG 60 440 (INSTRUMENTS) ×3 IMPLANT
STAPLER RELOAD GREEN 60MM (STAPLE) ×21
STAPLER RELOAD WHITE 60MM (STAPLE) ×30
STENT SET URETHERAL LEFT 7FR (STENTS) ×3 IMPLANT
STENT SET URETHERAL RIGHT 7FR (STENTS) ×3 IMPLANT
SUCTION FRAZIER HANDLE 12FR (TUBING) ×3
SUCTION TUBE FRAZIER 12FR DISP (TUBING) IMPLANT
SURGIFLO W/THROMBIN 8M KIT (HEMOSTASIS) IMPLANT
SUT CHROMIC 4 0 RB 1X27 (SUTURE) ×3 IMPLANT
SUT ETHILON 3 0 PS 1 (SUTURE) ×3 IMPLANT
SUT MNCRL AB 4-0 PS2 18 (SUTURE) ×8 IMPLANT
SUT PDS AB 0 CTX 36 PDP370T (SUTURE) ×12 IMPLANT
SUT SILK 3 0 SH 30 (SUTURE) ×3 IMPLANT
SUT SILK 3 0 SH CR/8 (SUTURE) ×3 IMPLANT
SUT VIC AB 2-0 CT1 27 (SUTURE)
SUT VIC AB 2-0 CT1 27XBRD (SUTURE) IMPLANT
SUT VIC AB 2-0 UR5 27 (SUTURE) ×13 IMPLANT
SUT VIC AB 3-0 SH 27 (SUTURE) ×21
SUT VIC AB 3-0 SH 27X BRD (SUTURE) ×4 IMPLANT
SUT VIC AB 3-0 SH 27XBRD (SUTURE) ×8 IMPLANT
SUT VIC AB 4-0 RB1 27 (SUTURE) ×12
SUT VIC AB 4-0 RB1 27XBRD (SUTURE) ×8 IMPLANT
SUT VLOC BARB 180 ABS3/0GR12 (SUTURE) ×3
SUTURE VLOC BRB 180 ABS3/0GR12 (SUTURE) ×2 IMPLANT
SYSTEM UROSTOMY GENTLE TOUCH (WOUND CARE) ×3 IMPLANT
TOWEL OR NON WOVEN STRL DISP B (DISPOSABLE) ×3 IMPLANT
TROCAR BLADELESS 15MM (ENDOMECHANICALS) ×3 IMPLANT
TROCAR XCEL NON-BLD 5MMX100MML (ENDOMECHANICALS) IMPLANT
WATER STERILE IRR 1000ML POUR (IV SOLUTION) ×3 IMPLANT
WATER STERILE IRR 3000ML UROMA (IV SOLUTION) ×3 IMPLANT

## 2021-10-25 NOTE — Plan of Care (Signed)

## 2021-10-25 NOTE — Anesthesia Procedure Notes (Signed)
Procedure Name: Intubation Date/Time: 10/25/2021 10:00 AM Performed by: Gean Maidens, CRNA Pre-anesthesia Checklist: Patient identified, Emergency Drugs available, Suction available, Patient being monitored and Timeout performed Patient Re-evaluated:Patient Re-evaluated prior to induction Oxygen Delivery Method: Circle system utilized Preoxygenation: Pre-oxygenation with 100% oxygen Induction Type: IV induction Ventilation: Mask ventilation without difficulty Laryngoscope Size: Mac, 3 and 4 Grade View: Grade I Tube type: Oral Tube size: 7.5 mm Number of attempts: 1 Airway Equipment and Method: Stylet Placement Confirmation: ETT inserted through vocal cords under direct vision, positive ETCO2 and breath sounds checked- equal and bilateral Secured at: 23 cm Tube secured with: Tape Dental Injury: Teeth and Oropharynx as per pre-operative assessment

## 2021-10-25 NOTE — Brief Op Note (Signed)
10/24/2021 - 10/25/2021  4:35 PM  PATIENT:  William [name]  80 y.o. male  PRE-OPERATIVE DIAGNOSIS:  BLADDER CANCER  POST-OPERATIVE DIAGNOSIS:  BLADDER CANCER  PROCEDURE:  Procedure(s) with comments: XI ROBOTIC ASSISTED LAPAROSCOPIC COMPLETE CYSTECT ILEAL CONDUIT/RADICAL PROSTATECTOMY AND SMALL BOWEL RESECTION (N/A) - 6 HRS LYMPH NODE DISSECTION (Bilateral) CYSTOSCOPY WITH INJECTION OF INDOCYANINE GREEN DYE (N/A) EXTENSIVE LAPAROSCOPIC LYSIS OF ADHESIONS (N/A)  SURGEON:  Surgeon(s) and Role:    [name], MD - Primary  PHYSICIAN ASSISTANT:   ASSISTANTS: none   ANESTHESIA:   local and general  EBL:  300 mL   BLOOD ADMINISTERED:none  DRAINS:  JP to bulb; RLQ Urostomy to gravity    LOCAL MEDICATIONS USED:  MARCAINE     SPECIMEN:  Source of Specimen:  1 - ureteral margins; 2 - small bowel; 3 - bladder + prostate en bloc; 4 - pelvic lymph nodes  DISPOSITION OF SPECIMEN:  PATHOLOGY  COUNTS:  YES  TOURNIQUET:  * No tourniquets in log *  DICTATION: .Other Dictation: Dictation Number [id number]  PLAN OF CARE: Admit to inpatient   PATIENT DISPOSITION:  PACU - hemodynamically stable.   Delay start of Pharmacological VTE agent (>24hrs) due to surgical blood loss or risk of bleeding: yes

## 2021-10-25 NOTE — Anesthesia Postprocedure Evaluation (Signed)
Anesthesia Post Note  Patient: William [name]  Procedure(s) Performed: XI ROBOTIC ASSISTED LAPAROSCOPIC COMPLETE CYSTECT ILEAL CONDUIT/RADICAL PROSTATECTOMY AND SMALL BOWEL RESECTION LYMPH NODE DISSECTION (Bilateral) CYSTOSCOPY WITH INJECTION OF INDOCYANINE GREEN DYE EXTENSIVE LAPAROSCOPIC LYSIS OF ADHESIONS     Patient location during evaluation: PACU Anesthesia Type: General Level of consciousness: awake and alert Pain management: pain level controlled Vital Signs Assessment: post-procedure vital signs reviewed and stable Respiratory status: spontaneous breathing, nonlabored ventilation, respiratory function stable and patient connected to nasal cannula oxygen Cardiovascular status: blood pressure returned to baseline and stable Postop Assessment: no apparent nausea or vomiting Anesthetic complications: no   No notable events documented.  Last Vitals:  Vitals:   10/25/21 1745 10/25/21 1800  BP: 129/72 138/74  Pulse: 96 (!) 32  Resp: 15 11  Temp:    SpO2: 99% 99%    Last Pain:  Vitals:   10/25/21 0824  TempSrc:   PainSc: 4                  [place]

## 2021-10-25 NOTE — Progress Notes (Addendum)
Pt was unable to take all of the bowel prep before midnight, Pt was able to take in about 2L of the bowel prep but stated this morning that it made him nauseated drinking that much.  Observed patient BM produced at 0430, stool liquid, but not clear and some particles were observed.

## 2021-10-25 NOTE — Transfer of Care (Signed)
Immediate Anesthesia Transfer of Care Note  Patient: William [name]  Procedure(s) Performed: XI ROBOTIC ASSISTED LAPAROSCOPIC COMPLETE CYSTECT ILEAL CONDUIT/RADICAL PROSTATECTOMY AND SMALL BOWEL RESECTION LYMPH NODE DISSECTION (Bilateral) CYSTOSCOPY WITH INJECTION OF INDOCYANINE GREEN DYE EXTENSIVE LAPAROSCOPIC LYSIS OF ADHESIONS  Patient Location: PACU  Anesthesia Type:General  Level of Consciousness: sedated, patient cooperative and responds to stimulation  Airway & Oxygen Therapy: Patient Spontanous Breathing and Patient connected to face mask oxygen  Post-op Assessment: Report given to RN and Post -op Vital signs reviewed and stable  Post vital signs: Reviewed and stable  Last Vitals:  Vitals Value Taken Time  BP 149/73 10/25/21 1641  Temp    Pulse 88 10/25/21 1643  Resp 23 10/25/21 1643  SpO2 99 % 10/25/21 1643  Vitals shown include unvalidated device data.  Last Pain:  Vitals:   10/25/21 0824  TempSrc:   PainSc: 4          Complications: No notable events documented.

## 2021-10-25 NOTE — Consult Note (Signed)
[place] Nurse requested for preoperative stoma site marking  Discussed surgical procedure and stoma creation with patient and family.  Explained role of the [place] nurse team.  Provided the patient with educational booklet and provided samples of pouching options.  Answered patient and family questions.   Examined patient lying, sitting, and standing in order to place the marking in the patient's visual field, away from any creases or abdominal contour issues and within the rectus muscle.    Marked for ileal conduit in the RLQ _5___  cm to the right of the umbilicus and  __7__ cm above the umbilicus. Marked large crease just lateral to the stoma site from abdominal puckering from previous surgery Patient's abdomen cleansed with CHG wipes at site markings, allowed to air dry prior to marking.Covered mark with thin film transparent dressing to preserve mark until date of surgery.   [place] Nurse team will follow up with patient after surgery for continue ostomy care and teaching.  [place] MSN, Rafael Hernandez, [place], [place]

## 2021-10-26 ENCOUNTER — Encounter (HOSPITAL_COMMUNITY): Payer: Self-pay | Admitting: Urology

## 2021-10-26 LAB — HEMOGLOBIN AND HEMATOCRIT, BLOOD
HCT: 37.8 % — ABNORMAL LOW (ref 39.0–52.0)
Hemoglobin: 12.2 g/dL — ABNORMAL LOW (ref 13.0–17.0)

## 2021-10-26 LAB — BASIC METABOLIC PANEL
Anion gap: 7 (ref 5–15)
BUN: 11 mg/dL (ref 8–23)
CO2: 27 mmol/L (ref 22–32)
Calcium: 8.2 mg/dL — ABNORMAL LOW (ref 8.9–10.3)
Chloride: 104 mmol/L (ref 98–111)
Creatinine, Ser: 0.94 mg/dL (ref 0.61–1.24)
GFR, Estimated: >60 mL/min (ref >60)
Glucose, Bld: 153 mg/dL — ABNORMAL HIGH (ref 70–99)
Potassium: 4.3 mmol/L (ref 3.5–5.1)
Sodium: 138 mmol/L (ref 135–145)

## 2021-10-26 MED ORDER — ALVIMOPAN 12 MG PO CAPS
12.0000 mg | ORAL_CAPSULE | Freq: Two times a day (BID) | ORAL | Status: DC
  Administered 2021-10-27 (×2): 12 mg via ORAL
  Filled 2021-10-26 (×2): qty 1

## 2021-10-26 NOTE — TOC Progression Note (Signed)
Transition of Care [facility]) - Progression Note    Patient Details  Name: William [name] MRN: [id number] Date of Birth: 11-03-1941  Transition of [facility]) CM/SW Contact  Purcell Mouton, RN Phone Number: 10/26/2021, 11:29 AM  Clinical Narrative:    Pt from home and will return home. TOC will continue to follow for discharge needs.    Expected Discharge Plan: Home/Self Care Barriers to Discharge: No Barriers Identified  Expected Discharge Plan and Services Expected Discharge Plan: Home/Self Care       Living arrangements for the past 2 months: Single Family Home                                       Social Determinants of Health (SDOH) Interventions    Readmission Risk Interventions No flowsheet data found.

## 2021-10-26 NOTE — Evaluation (Signed)
Physical Therapy Evaluation Patient Details Name: William [name] MRN: [id number] DOB: 23-Jul-1941 Today's Date: 10/26/2021  History of Present Illness  Pt is a 80 year old male s/p robotic cystoprostatectomy + pelvic node dissection + conduit diversion + extensive adhesioloysis + small bowel resection + cysto / ICG injection on 10/25/21 due to bladder cancer.  PMHx significant for but not limited to tinnitus, Covid-19, back surgery  Clinical Impression  Pt admitted with above diagnosis. Pt currently with functional limitations due to the deficits listed below (see PT Problem List). Pt will benefit from skilled PT to increase their independence and safety with mobility to allow discharge to the venue listed below.  Pt assisted with ambulating in hallway short distance however fatigued quickly.  Pt will likely progress well to d/c home and would benefit from HHPT and RW at this time.       Recommendations for follow up therapy are one component of a multi-disciplinary discharge planning process, led by the attending physician.  Recommendations may be updated based on patient status, additional functional criteria and insurance authorization.  Follow Up Recommendations Home health PT    Assistance Recommended at Discharge Set up Supervision/Assistance  Functional Status Assessment Patient has had a recent decline in their functional status and demonstrates the ability to make significant improvements in function in a reasonable and predictable amount of time.  Equipment Recommendations  Rolling walker (2 wheels)    Recommendations for Other Services       Precautions / Restrictions Precautions Precautions: Fall Precaution Comments: left JP drain      Mobility  Bed Mobility Overal bed mobility: Needs Assistance Bed Mobility: Supine to Sit     Supine to sit: Mod assist;HOB elevated     General bed mobility comments: encouraged log roll however pt felt elevated HOB and bringing LEs  over would be less painful; son also assisted with trunk upright    Transfers Overall transfer level: Needs assistance Equipment used: Rolling walker (2 wheels) Transfers: Sit to/from Stand Sit to Stand: Min assist           General transfer comment: light assist to rise and steady, cues for hand placement    Ambulation/Gait Ambulation/Gait assistance: Min guard;Min assist Gait Distance (Feet): 60 Feet Assistive device: Rolling walker (2 wheels) Gait Pattern/deviations: Step-through pattern;Decreased stride length;Trunk flexed Gait velocity: decr   General Gait Details: verbal cues for use of RW, posture, pt requiring some assist for negotiating RW at times reporting UE fatigue; SpO2 91% on room air, pt reported dizziness only when sitting in recliner after ambulating and BP improved 122/61 mmHg (from previous reading 95/49)  Stairs            Wheelchair Mobility    Modified Rankin (Stroke Patients Only)       Balance Overall balance assessment: Mild deficits observed, not formally tested                                           Pertinent Vitals/Pain Pain Assessment: Faces Faces Pain Scale: Hurts little more Pain Location: surgical site with transitional movements Pain Intervention(s): Monitored during session;Premedicated before session;Repositioned    Home Living Family/patient expects to be discharged to:: Private residence Living Arrangements: Spouse/significant other Available Help at Discharge: Family Type of Home: House Home Access: Stairs to enter Entrance Stairs-Rails: None Entrance Stairs-Number of Steps: 2   Home  Layout: One level Home Equipment: Cane - single point      Prior Function Prior Level of Function : Independent/Modified Independent             Mobility Comments: reports sometimes needing assist for trunk getting OOB?       Hand Dominance        Extremity/Trunk Assessment        Lower Extremity  Assessment Lower Extremity Assessment: Generalized weakness    Cervical / Trunk Assessment Cervical / Trunk Assessment: Normal  Communication   Communication: HOH  Cognition Arousal/Alertness: Awake/alert Behavior During Therapy: WFL for tasks assessed/performed Overall Cognitive Status: Within Functional Limits for tasks assessed                                          General Comments      Exercises     Assessment/Plan    PT Assessment Patient needs continued PT services  PT Problem List Decreased balance;Decreased activity tolerance;Decreased strength;Decreased mobility;Decreased knowledge of use of DME       PT Treatment Interventions Gait training;DME instruction;Therapeutic exercise;Balance training;Functional mobility training;Therapeutic activities;Patient/family education    PT Goals (Current goals can be found in the Care Plan section)  Acute Rehab PT Goals PT Goal Formulation: With patient Time For Goal Achievement: 11/04/2021 Potential to Achieve Goals: Good    Frequency Min 3X/week   Barriers to discharge        Co-evaluation               AM-PAC PT "6 Clicks" Mobility  Outcome Measure Help needed turning from your back to your side while in a flat bed without using bedrails?: A Little Help needed moving from lying on your back to sitting on the side of a flat bed without using bedrails?: A Lot Help needed moving to and from a bed to a chair (including a wheelchair)?: A Little Help needed standing up from a chair using your arms (e.g., wheelchair or bedside chair)?: A Little Help needed to walk in hospital room?: A Little Help needed climbing 3-5 steps with a railing? : A Lot 6 Click Score: 16    End of Session Equipment Utilized During Treatment: Gait belt Activity Tolerance: Patient tolerated treatment well Patient left: in chair;with call bell/phone within reach;with chair alarm set;with family/visitor present Nurse  Communication: Mobility status PT Visit Diagnosis: Difficulty in walking, not elsewhere classified (R26.2)    Time: 5883-2549 PT Time Calculation (min) (ACUTE ONLY): 24 min   Charges:   PT Evaluation $PT Eval Low Complexity: 1 Low PT Treatments $Gait Training: 8-22 mins      Jannette Spanner PT, DPT Acute Rehabilitation Services Pager: [phone] Office: [place] 10/26/2021, 2:21 PM

## 2021-10-26 NOTE — Consult Note (Signed)
[facility] Nurse ostomy consult note Stoma type/location: RLQ, ileal conduit Stomal assessment/size: pink, moist, with 2 stents in place (1 pink/1 blue) Peristomal assessment: NA Treatment options for stomal/peristomal skin: NA Output bloody urine Ostomy pouching: 2pc. In place from [place] provided:  Basic beginning education with patient; POOR recall Reviewed creation of ostomy; patient asks questions but many related to preoperative Internet research.  Supplies; 1pc flat and convex placed in room. Planning pouch change with wife and patient tomorrow am at 34.   Suggest HHRN for educational/ostomy support at home.   Enrolled patient in [place] program: Yes  [place] Nurse will follow along with you for continued support with ostomy teaching and care [place] MSN, [place], [place], [place], [place]

## 2021-10-26 NOTE — Progress Notes (Signed)
1 Day Post-Op Subjective: Pain controlled improved overnight with prn dilaudid, has not been out of bed, no flatus yet    Objective: Vital signs in last 24 hours: Temp:  [97.3 F (36.3 C)-98.6 F (37 C)] 97.3 F (36.3 C) (10/27 0600) Pulse Rate:  [31-104] 92 (10/27 0400) Resp:  [11-19] 14 (10/27 0600) BP: (96-149)/(54-90) 96/61 (10/27 0600) SpO2:  [90 %-100 %] 90 % (10/27 0600) Weight:  [77.7 kg] 77.7 kg (10/26 0824)  Intake/Output from previous day: 10/26 0701 - 10/27 0700 In: 4201.2 [P.O.:120; I.V.:4031.2; IV Piggyback:50] Out: 1220 [Drains:320; Stool:600; Blood:300] Intake/Output this shift: No intake/output data recorded.  Physical Exam:  General: Alert and oriented CV: RRR Lungs: Clear Abdomen: Soft, ND, RLQ urostomy pink and protruding, 2 stents present with pink tinged urine output. LLQ JP serosang.  Incisions: C/d/I, midline island dressing  Ext: NT, No erythema  Lab Results: Recent Labs    10/24/21 1734 10/25/21 1757 10/26/21 0359  HGB 13.4 12.8* 12.2*  HCT 41.2 39.7 37.8*   BMET Recent Labs    10/25/21 1757 10/26/21 0359  NA 135 138  K 3.8 4.3  CL 104 104  CO2 25 27  GLUCOSE 162* 153*  BUN 10 11  CREATININE 0.88 0.94  CALCIUM 8.2* 8.2*     Studies/Results: DG Chest 1 View  Result Date: 10/24/2021 CLINICAL DATA:  Preoperative evaluation. EXAM: CHEST  1 VIEW COMPARISON:  September 23, 2020 FINDINGS: Mild, diffuse, chronic appearing increased lung markings are seen, slightly more prominent within the bilateral lung bases and mid left lung. There is no evidence of acute infiltrate, pleural effusion or pneumothorax. The heart size and mediastinal contours are within normal limits. Degenerative changes seen throughout the thoracic spine. IMPRESSION: Chronic interstitial lung disease without evidence of acute or active cardiopulmonary disease. Electronically Signed   By: Virgina Norfolk M.D.   On: 10/24/2021 19:09    Assessment/Plan: -Keep NPO sips  and chips  -Continue mIVF -Scheduled tylenol, prn oxycodone, dilaudid for breakthrough -Cont peri-op zosyn x 24 hours  -Daily labs x 5 days  -PT eval -Ostomy teaching -OOB and ambulating  -IS -Hold DVT prophylaxis for now, SCD while in bed    LOS: 1 day   William [name] 10/26/2021, 7:19 AM

## 2021-10-26 NOTE — Progress Notes (Signed)
Patient have been alert with some drowsiness, able to verbalized need and follow command, C/O intermittent pain,  PRN pain med given. Continue to monitor patient

## 2021-10-26 NOTE — Op Note (Signed)
NAME: William [name], William [name] MEDICAL RECORD NO: [id number] ACCOUNT NO: [id number] DATE OF BIRTH: Feb 26, 1941 FACILITY: [facility] LOCATION: [facility] PHYSICIAN: [name], MD  Operative Report   DATE OF PROCEDURE: 10/25/2021  PREOPERATIVE DIAGNOSIS:  Muscle invasive bladder cancer.  PROCEDURE PERFORMED: 1.  Cystoscopy with injection of indocyanine green dye 2.  Robotic-assisted laparoscopic radical cystectomy. 3.  Robotic radical prostatectomy. 4.  Bilateral pelvic lymph node dissection. 5.  Extensive laparoscopic adhesiolysis. 6.  Small bowel resection. 7.  Ileal conduit urinary diversion.  ESTIMATED BLOOD LOSS:  300 mL.  SPECIMENS: 1.  Right distal ureteral margin. 2.  Final right ureteral margin. 3.  Left distal ureteral margin. 4.  Final left ureteral margin. 5.  Bladder and prostate. 6.  Right external iliac lymph nodes. 7.  Right obturator lymph nodes. 8.  Right common iliac lymph nodes. 9.  Left external iliac lymph nodes. 10.  Left obturator lymph nodes. 11.  Left common iliac lymph nodes. 12.  Small bowel with prior anastomosis.  DRAINS: 1.  Jackson-Pratt drain to bulb suction. 2.  Right lower quadrant urostomy to gravity drainage, with the right (red) and left (blue) bander stents to gravity drainage.  INDICATIONS:  The patient is a pleasant and very vigorous 80 year old man with a history of muscle invasive bladder cancer.  He has been on curative intent path with neoadjuvant chemotherapy, planned for curative intent cystectomy.  His most recent  restaging imaging was unremarkable for locally advanced or distant disease and he wished to proceed to have the surgery in the fall, understanding that increase in time between chemo and surgery may increase the average pathologic outcomes.  Informed  consent was obtained and placed in medical record.  He was admitted yesterday for stomal marking, bowel prep and labs.  DESCRIPTION OF PROCEDURE:  The patient being verified,  procedure being cystectomy and conduit diversion was confirmed.  Procedure timeout was performed.  Intravenous antibiotics administered. General endotracheal anesthesia induced.  The patient placed  into a low lithotomy position.  A sterile field was created, prepped and draped the patient's penis, perineum, and proximal thigh using iodine.  After his arms were tucked to the side using gel rolls, a test of steep Trendelenburg positioning was  performed and found to be suitably positioned.  He was further fastened to the operative table using 3-inch tape over foam padding across supraxiphoid chest.  Next, a high flow, low pressure pneumoperitoneum was obtained using Veress technique in the  supraumbilical midline, having passed the aspiration drop test.  The site was chosen based on his prior axial imaging, which revealed preserved fat planes, position beginning approximately 2 cm superior to the umbilicus. An 8 mm robotic camera port was  then placed in the same location.  Laparoscopic examination of the peritoneal cavity revealed multifocal, very dense adhesions, some omental and some small bowel to abdominal wall, mostly in the midline in the right hemi-abdomen.  Additional ports were  placed as follows:  Left paramedian 8 mm robotic port, left far lateral 8 mm robotic port.  Having placed 3 ports allowing for triangulation, very careful systematic adhesiolysis was performed of numerous small bowel adhesions to the anterior abdominal  wall in the midline and some of the right lower quadrant as well as numerous omental adhesions, mostly on the right lateral side of the abdomen.  Also visualized loops of bowel within the deep pelvis.  Additional ports had been placed as follows:  Right  paramedian 8 mm robotic port, right far lateral  12 mm AirSeal assistant port, right paramedian 15 mm assistant port at the site of the previously marked stomal site.  Robot was docked and passed the electronic checks. Next,  attention was directed at  release of further pelvic adhesions.  There was a very large and relatively adherent loop of  small bowel that reached to the deep pelvis posterior to the bladder, anterior of the colon.  Exquisite care was taken to release the tethering adhesions to  the deep pelvis as well as interloop adhesions between this itself.  This has reduced all the bowel out of the deep pelvis other than the descending colon and appeared to be favorable anatomy for the extirpative portions. The right lower quadrant was  very carefully inspected and again due to the significant nature of interloop adhesions, the area of the ileocecal junction was not easily identified; however, there appeared to be sufficient mobility and viability of small bowel for conduit formation,  however, felt that this portion to be performed later would likely be safest for an open fashion.  Attention was then redirected at the bladder and prostate removal.  This was after cystoscopy was performed. Cystourethroscopy was performed using a  21-French rigid cystoscope with offset lens.  Inspection of the anterior and posterior urethra was unremarkable.  Inspection of the bladder revealed minimal volume of intraluminal tumor and 2 mL of indocyanine green dye was injected across submucosal  blebs in the trigone area and the silicone catheter was placed per urethra to straight drain.  Attention was then directed at the left sided retroperitoneal dissection. Incision was made lateral to the descending colon near the iliacs proximal for a  distance of approximately 10 cm and distally lateral to the left medial umbilical ligament towards the area of the anterior abdomen.  This created a large retroperitoneal flap, which was used to retract the bowel medially.  Left ureter was encountered as  it coursed over the iliac vessels, marked with vessel loop, dissected proximally for a distance of approximately 8 cm and distally to the  ureterovesical junction which was doubly clipped and ligated, proximal ureter, containing a dyed tagged clip and tucked our of the true pelvis.  Frozen section negative for carcinoma.  Next, left-sided pelvic lymph node dissection was performed. First, the left common group with the boundaries being aortic bifurcation, iliac bifurcation, lymphostasis achieved with cold clips.  Next,  the left external iliac group was dissected free.  The boundaries being the left external iliac artery, vein, pelvic sidewall, lymphostasis was achieved with cold clips and then finally left obturator group, the boundaries being left external iliac  vein, pelvic sidewall, obturator nerve, lymphostasis was achieved with cold clips.  The left obturator nerve was inspected following these maneuvers, found to be uninjured.  Dissection then proceeded lateral to the endopelvic fascia towards the area of  apex of the prostate on the left side.  Attention was directed at right-sided retroperitoneal dissection.  Incision was made lateral to the right medial umbilical ligament from the anterior abdominal wall inferolaterally towards the area of the presumed  cecum.  The right ureter was encountered as it coursed the iliac vessels, marked with vessel loop for a distance approximately a centimeter distally to the ureterovesical junction which was doubly clipped and ligated with white tagged suture and tucked out of true  pelvis.  Frozen section negative for carcinoma.  Next, right-sided pelvic lymphadenectomy was performed as per the left in a mirror image fashion and the right obturator  nerve was also inspected following the maneuvers, found to be uninjured.  The right  bladder wall was swept away from the pelvic sidewall as was the endopelvic fascia lateral to the prostate.  Next, posterior dissection was performed by creating an inverted U-shaped posterior peritoneal flap behind the bladder.  Dissection proceeded  within this plane which  was inferior to the vas and seminal vessels towards the area of the apex of the prostate.  This developed the vascular pedicles, was controlled using white load stapler x2 each side, thus controlling the vascular pedicle of the  bladder and then subsequently of the prostate.  The space of Retzius was then developed to the medial umbilical ligaments.  This exposed the dorsal venous complex of the prostate, was controlled using a green load stapler. Final apical dissection was  performed by transecting the membranous urethra coldly and placing two large clips on the Foley catheter which was purposely ligated and used as a bucket handle.  This completely freed up the large bladder plus prostate specimen which was placed in the  EndoCatch bag for later retrieval.  Digital rectal exam was then performed using indicator glove under laparoscopic vision.  No evidence of rectal violation was noted.  Next, the left ureter was transposed to the right side via retroperitoneal window  just anterior to the aortic bifurcation.  In the right ureter, left ureter, tag sutures were placed into Hem-o-lok clip.  A closed suction drain was brought out through previous left lateral most robotic port site.  Specimen bag was brought out through  the left paramedian robotic port site and the right and left ureter tag sutures were tied with a self-locking laparoscopic grasper via the right paramedian port site.  Robot was then undocked.  Specimen was retrieved by extending the previous camera port  site inferiorly for a distance of approximately 20 cm and then superiorly for additional 5. Specimen was removed and set aside for permanent pathology.  An Omni-Tract type retractor was then placed, this allowed much better visualization of the complex  bowel adhesions, placed in the right lower quadrant.  Additional very careful adhesiolysis was performed of multiple interloop adhesions, mostly of the ileum. With better visualization, the  ileocecal junction was easily identified.  This was traced  proximally.  The segment of distal ileum does appeared to be of a suitable vascularity for conduit formation. Just proximal to this, a segment of bowel with incredibly tight knuckling and an apparent prior bowel-bowel anastomosis, this area was  essentially obstructing in its orientation.  The geometry not being favorable for bowel-bowel anastomosis as such.  Therefore, the conduit segment was taken into continuity using green load stapler proximal and distal for approximately 14 cm.  An  additional segment approximately 10 cm was taken into continuity proximal to this vessel, incorporating the prior bowel-bowel anastomosis and very sharp knuckling and very dense adhesions of the distal ileum. Mesentery was developed with a white load  stapler.  This then resulted in excellent geometry for bowel-bowel anastomosis performed.  The conduit was lined to retroperitoneal orientation and bowel-bowel anastomosis was performed using green load stapler  on the antimesenteric border with an imbricating layer of  anastomosis bolstered with interrupted silk.  The free end was oversewn with running silk, with second imbricating layer of silk.  The mesenteric defect was reapproximated using interrupted silk.  The bowel-bowel anastomosis was viable, palpably patent  and the entire area of the ileum was once again inspected.  No obvious  visceral injury was noted.  I was quite happy with this portion. The attention was directed at conduit formation.  The proximal staple line of the conduit was occluded using running  Vicryl, distal staple line was removed and first left-sided ureteroenteric anastomosis was performed, first by excising approximately 4 mm of proximal conduit, placing 4 mucosal everting sutures and then spatulated the anastomosis in a heel-to-toe  fashion over a blue color bander stent 26 cm to the anastomosis.  Right ureteroenteric anastomosis was  performed in a similar fashion, approximately 1.5 cm distal on the conduit and placing a red color bander stent 26 cm to anastomosis. The stents were  anchored at the level of the bowel using chromic suture.  A quarter sized diameter segment of skin and subcutaneous tissue was excised at the level of the previously marked stomal site, 4 fascial anchoring sutures were applied with 0 Vicryl. Conduit was  brought through this and anchored in a quadrant fashion and 4 everting rosebudding sutures were then applied with 0 Vicryl.  The abdomen was once again inspected.  Hemostasis was excellent.  No obvious visceral injury.  The omentum was brought over the  extraction site which was then reapproximated using figure-of-eight PDS times approximately 12 followed by reapproximation of Scarpa's with running Vicryl.  All incision sites were infiltrated with dilute lipolyzed Marcaine and closed at the level of the  skin using subcuticular Monocryl and Dermabond.  Additional rosebudding of the stoma was performed with interrupted Vicryl.  The stoma appliance was placed and the procedure was terminated.  The patient tolerated the procedure well.  No immediate  perioperative complications.  The patient was taken to postanesthesia care unit in stable condition.  Plan for progressive care admission.   SHW D: 10/25/2021 4:54:26 pm T: 10/26/2021 9:31:00 am  JOB: [id number]/ [id number]

## 2021-10-27 LAB — BASIC METABOLIC PANEL
Anion gap: 7 (ref 5–15)
BUN: 17 mg/dL (ref 8–23)
CO2: 27 mmol/L (ref 22–32)
Calcium: 7.9 mg/dL — ABNORMAL LOW (ref 8.9–10.3)
Chloride: 104 mmol/L (ref 98–111)
Creatinine, Ser: 0.74 mg/dL (ref 0.61–1.24)
GFR, Estimated: >60 mL/min (ref >60)
Glucose, Bld: 97 mg/dL (ref 70–99)
Potassium: 4.1 mmol/L (ref 3.5–5.1)
Sodium: 138 mmol/L (ref 135–145)

## 2021-10-27 LAB — HEMOGLOBIN AND HEMATOCRIT, BLOOD
HCT: 32.2 % — ABNORMAL LOW (ref 39.0–52.0)
Hemoglobin: 10 g/dL — ABNORMAL LOW (ref 13.0–17.0)

## 2021-10-27 MED ORDER — ENOXAPARIN SODIUM 40 MG/0.4ML IJ SOSY
40.0000 mg | PREFILLED_SYRINGE | INTRAMUSCULAR | Status: DC
  Administered 2021-10-27 – 2021-11-01 (×6): 40 mg via SUBCUTANEOUS
  Filled 2021-10-27 (×7): qty 0.4

## 2021-10-27 MED ORDER — METOCLOPRAMIDE HCL 5 MG/ML IJ SOLN
10.0000 mg | Freq: Three times a day (TID) | INTRAMUSCULAR | Status: DC
  Administered 2021-10-27 – 2021-11-02 (×17): 10 mg via INTRAVENOUS
  Filled 2021-10-27 (×17): qty 2

## 2021-10-27 MED ORDER — ACETAMINOPHEN 500 MG PO TABS
1000.0000 mg | ORAL_TABLET | Freq: Three times a day (TID) | ORAL | Status: AC
  Administered 2021-10-27 – 2021-11-01 (×14): 1000 mg via ORAL
  Filled 2021-10-27 (×14): qty 2

## 2021-10-27 MED ORDER — ONDANSETRON HCL 4 MG/2ML IJ SOLN: 4.0000 mg | Freq: Four times a day (QID) | INTRAMUSCULAR | Status: DC | PRN

## 2021-10-27 NOTE — Care Management Important Message (Signed)
Medicare IM printed for W/[facility] Social Work to give to the patient. 

## 2021-10-27 NOTE — Plan of Care (Signed)
  Problem: Clinical Measurements: Goal: Diagnostic test results will improve Outcome: Progressing   Problem: Coping: Goal: Level of anxiety will decrease Outcome: Progressing   Problem: Pain Managment: Goal: General experience of comfort will improve Outcome: Progressing

## 2021-10-27 NOTE — Consult Note (Signed)
[place] Nurse ostomy follow up Stoma type/location: RLQ; ileal conduit  Stomal assessment/size: 1 3/4" round; 2 stents in place blue and red Peristomal assessment: mild contact dermatitis pattern of the tape border; most likely sensitivity to tape or from the tension on the abdomen during placement of the pouch intraoperatively  Treatment options for stomal/peristomal skin: no-sting skin barrier wipe Output bloody urine Ostomy pouching: 1pc convex urinary pouch with 2" skin barrier ring  Education provided with patient and his wife; patient has very poor attention and recall; sleeping during visit due to IV pain meds   Explained stoma characteristics (budded, flush, color, texture, care) Demonstrated pouch change (cutting new barrier, measuring stoma, cleaning peristomal skin and stoma, use of barrier ring) Education on use wick in stoma to keep skin dry with pouch change Education on emptying when 1/3 to 1/2 full and how to empty Education on urine characteristics (sediment, mucous) Demonstrated hooking pouch to nighttime drainage bag Discussed treatment of peristomal skin (ostomy powder, skin barrier wipes); patient with mild contact dermatitis    Enrolled patient in [employer] Discharge program: Yes  [place] Nurse will follow along with you for continued support with ostomy teaching and care [place] MSN, RN, [place], [place], [place]

## 2021-10-27 NOTE — Progress Notes (Addendum)
2 Days Post-Op s/p robotic cystoprostatectomy w/ pelvic node dissection + ileal conduit diversion + extensive adhesioloysis + small bowel resection + cysto / ICG injection on 10/25/21.   Subjective: -Worked with PT yesterday, walked around unit 5 times  -Pain well controlled, required dilaudid push x 2 over last 24 hours -No nausea, no hiccups or belching   Objective: Vital signs in last 24 hours: Temp:  [97.4 F (36.3 C)-98.2 F (36.8 C)] 97.8 F (36.6 C) (10/28 0622) Pulse Rate:  [84-105] 87 (10/28 0622) Resp:  [9-29] 20 (10/28 0622) BP: (93-110)/(49-76) 101/54 (10/28 0622) SpO2:  [85 %-99 %] 99 % (10/28 0622)  Intake/Output from previous day: 10/27 0701 - 10/28 0700 In: 2695 [I.V.:2568.5; IV Piggyback:126.6] Out: 835 [Drains:135; Stool:700] Intake/Output this shift: Total I/O In: 1073.7 [I.V.:1073.7] Out: 345 [Drains:70; Stool:275]  Physical Exam:  General: Alert and oriented CV: RRR Lungs: Clear Abdomen: Soft, ND, RLQ urostomy pink and protruding, 2 stents present with pink tinged urine output. LLQ JP serosang.  Incisions: C/d/I, midline island dressing  Ext: NT, No erythema  Lab Results: Recent Labs    10/25/21 1757 10/26/21 0359 10/27/21 0331  HGB 12.8* 12.2* 10.0*  HCT 39.7 37.8* 32.2*   BMET Recent Labs    10/25/21 1757 10/26/21 0359  NA 135 138  K 3.8 4.3  CL 104 104  CO2 25 27  GLUCOSE 162* 153*  BUN 10 11  CREATININE 0.88 0.94  CALCIUM 8.2* 8.2*     Studies/Results: No results found.  Assessment/Plan: POD2 s/p robotic cystoprostatectomy w/ pelvic node dissection + ileal conduit diversion + extensive adhesioloysis + small bowel resection + cysto / ICG injection on 10/25/21. Recovering well post-operatively. Awaiting return of bowel function, on pathway.   -Advance to clears today -Continue mIVF -Scheduled tylenol, prn oxycodone, dilaudid for breakthrough -Received peri-op zosyn x 24 hours -Daily labs x 5 days  -PT eval -Ostomy teaching  planned for today with wife  -OOB and ambulating  -IS -Start lovenox 40mg  qd for DVT prophylaxis    LOS: 2 days   William [name] 10/27/2021, 6:46 AM   I have seen and examined the patient and agree with Dr. Lahoma [name] plan.     Briefly,   S: 1 - Bladder Cancer - s/p robotic cystoprostatectomy + pelvic node dissection + conduit diversion + extensive adhesioloysis + small bowel resection + cysto / ICG injection on 10/25/21. Path pending. Admitted 10/25 for bowel prep, labs, stomal marking.   2 - Post-Op Ileus - pt with planned bowel anastamosis and also extensive bowel adhesiolysis as a part of surgery 10/26. NPO initially post-op. Received entereg peri-op. Ice chips POD1, Clears POD 2.    3 - Disposition / Rehab - independent and lives with wife at baseline. Ostomy RN team working with in house. PT eval pending.    Today "William [name]" is mixed. Ambulating and OOB but some nausea earlier, still with vigorous bowel sounds.   O: NAD, AOx3 vigorous for age Sinus 80s by monitor Non-labored breathing on minimal [place] O2 SNTND, recent midline large surgical site and port sites c/d/I. JP serosanguinous / non-foul. RLQ Urostomy pink / patent with Rt (red) and Lt (blue) bander stents SCD's in place, no c/c/e.   A/P Doing well POD 2 s/p major surgery. Continue daily labs and current drains. Goals of OOB and ambulation discussed. PT Reduce IVF to 75 to prevent overload. Begin reglan 10 Q8 for nausea and encourage bowel motility.

## 2021-10-28 LAB — TYPE AND SCREEN
ABO/RH(D): O POS
Antibody Screen: NEGATIVE
Unit division: 0
Unit division: 0

## 2021-10-28 LAB — BPAM RBC
Blood Product Expiration Date: 202211232359
Blood Product Expiration Date: 202211232359
Unit Type and Rh: 5100
Unit Type and Rh: 5100

## 2021-10-28 LAB — BASIC METABOLIC PANEL
Anion gap: 5 (ref 5–15)
BUN: 12 mg/dL (ref 8–23)
CO2: 25 mmol/L (ref 22–32)
Calcium: 7.8 mg/dL — ABNORMAL LOW (ref 8.9–10.3)
Chloride: 106 mmol/L (ref 98–111)
Creatinine, Ser: 0.61 mg/dL (ref 0.61–1.24)
GFR, Estimated: >60 mL/min (ref >60)
Glucose, Bld: 117 mg/dL — ABNORMAL HIGH (ref 70–99)
Potassium: 3.7 mmol/L (ref 3.5–5.1)
Sodium: 136 mmol/L (ref 135–145)

## 2021-10-28 LAB — HEMOGLOBIN AND HEMATOCRIT, BLOOD
HCT: 29 % — ABNORMAL LOW (ref 39.0–52.0)
Hemoglobin: 9.1 g/dL — ABNORMAL LOW (ref 13.0–17.0)

## 2021-10-28 NOTE — Progress Notes (Signed)
Pharmacy Brief Note - Alvimopan (Entereg)  The standing order set for alvimopan (Entereg) now includes an automatic order to discontinue the drug after the patient has had a bowel movement. The change was approved by the [place] and the Medical Executive Committee.   This patient has had bowel movements documented by nursing. Therefore, alvimopan has been discontinued. If there are questions, please contact the pharmacy at [phone].   Thank you-  Dia Sitter, PharmD, BCPS 10/28/2021 10:32 AM

## 2021-10-28 NOTE — Plan of Care (Signed)
  Problem: Clinical Measurements: Goal: Ability to maintain clinical measurements within normal limits will improve Outcome: Progressing   Problem: Activity: Goal: Risk for activity intolerance will decrease Outcome: Progressing   Problem: Education: Goal: Knowledge of the procedure and recovery process will improve Outcome: Progressing   Problem: Bowel/Gastric: Goal: Gastrointestinal status for postoperative course will improve Outcome: Progressing   Problem: Pain Management: Goal: General experience of comfort will improve Outcome: Progressing   Problem: Urinary Elimination: Goal: Ability to avoid or minimize complications of infection will improve Outcome: Progressing

## 2021-10-28 NOTE — Progress Notes (Signed)
Pt has new swelling of penis ad scrotum. MD Victoria notified. Will continue to monitor

## 2021-10-28 NOTE — Progress Notes (Signed)
3 Days Post-Op Subjective: Patient reports he feels tired, but had two BMs last night. Tolerating a clears.   Objective: Vital signs in last 24 hours: Temp:  [97.9 F (36.6 C)-98.5 F (36.9 C)] 97.9 F (36.6 C) (10/29 0421) Pulse Rate:  [30-101] 85 (10/29 0421) Resp:  [12-18] 18 (10/29 0421) BP: (106-125)/(59-61) 106/61 (10/29 0421) SpO2:  [94 %-100 %] 96 % (10/29 0701)  Intake/Output from previous day: 10/28 0701 - 10/29 0700 In: 3100.8 [P.O.:1020; I.V.:2080.8] Out: 1130 [Urine:650; Drains:480] Intake/Output this shift: Total I/O In: 120 [P.O.:120] Out: 80 [Drains:80]  Physical Exam:  Sitting in the chair, looks well, alert and oriented Cardiovascular-regular rate and rhythm Lungs-clear to auscultation bilaterally Abdomen-mild distention.  Ileal conduit pink.  Good bowel sounds.  Incisions clean dry and intact.  Urine light pink in bag. Extremity-no calf pain or swelling  Lab Results: Recent Labs    10/26/21 0359 10/27/21 0331 10/28/21 0348  HGB 12.2* 10.0* 9.1*  HCT 37.8* 32.2* 29.0*   BMET Recent Labs    10/27/21 0331 10/28/21 0348  NA 138 136  K 4.1 3.7  CL 104 106  CO2 27 25  GLUCOSE 97 117*  BUN 17 12  CREATININE 0.74 0.61  CALCIUM 7.9* 7.8*   No results for input(s): LABPT, INR in the last 72 hours. No results for input(s): LABURIN in the last 72 hours. Results for orders placed or performed during the hospital encounter of 10/24/21  SARS Coronavirus 2 by RT PCR (hospital order, performed in [facility] hospital lab) Nasopharyngeal Nasopharyngeal Swab     Status: None   Collection Time: 10/24/21  4:46 PM   Specimen: Nasopharyngeal Swab  Result Value Ref Range Status   SARS Coronavirus 2 NEGATIVE NEGATIVE Final    Comment: (NOTE) SARS-CoV-2 target nucleic acids are NOT DETECTED.  The SARS-CoV-2 RNA is generally detectable in upper and lower respiratory specimens during the acute phase of infection. The lowest concentration of SARS-CoV-2 viral  copies this assay can detect is 250 copies / mL. A negative result does not preclude SARS-CoV-2 infection and should not be used as the sole basis for treatment or other patient management decisions.  A negative result may occur with improper specimen collection / handling, submission of specimen other than nasopharyngeal swab, presence of viral mutation(s) within the areas targeted by this assay, and inadequate number of viral copies (<250 copies / mL). A negative result must be combined with clinical observations, patient history, and epidemiological information.  Fact Sheet for Patients:   StrictlyIdeas.no  Fact Sheet for Healthcare Providers: BankingDealers.co.za  This test is not yet approved or  cleared by the [place] FDA and has been authorized for detection and/or diagnosis of SARS-CoV-2 by FDA under an Emergency Use Authorization (EUA).  This EUA will remain in effect (meaning this test can be used) for the duration of the COVID-19 declaration under Section 564(b)(1) of the Act, 21 U.S.C. section 360bbb-3(b)(1), unless the authorization is terminated or revoked sooner.  Performed at [facility], [place] [address], [place], [place] [address]   Surgical pcr screen     Status: None   Collection Time: 10/24/21  4:47 PM   Specimen: Nasopharyngeal Swab; Nasal Swab  Result Value Ref Range Status   MRSA, PCR NEGATIVE NEGATIVE Final   Staphylococcus aureus NEGATIVE NEGATIVE Final    Comment: (NOTE) The Xpert SA Assay (FDA approved for NASAL specimens in patients 55 years of age and older), is one component of a comprehensive  surveillance program. It is not intended to diagnose infection nor to guide or monitor treatment. Performed at [facility], [place] [address] [place], [place] [address]     Studies/Results: No results found.  Assessment/Plan: Postop day 3 s/p robotic  cystoprostatectomy w/ pelvic node dissection + ileal conduit diversion + extensive adhesioloysis + small bowel resection + cysto / ICG injection on 10/25/21.   Doing well.  Continue ambulation and physical therapy. Considered advance to full liquids but might hold off until tomorrow.    LOS: 3 days   Festus Aloe 10/28/2021, 10:28 AM

## 2021-10-29 LAB — BASIC METABOLIC PANEL
Anion gap: 5 (ref 5–15)
BUN: 10 mg/dL (ref 8–23)
CO2: 25 mmol/L (ref 22–32)
Calcium: 7.8 mg/dL — ABNORMAL LOW (ref 8.9–10.3)
Chloride: 107 mmol/L (ref 98–111)
Creatinine, Ser: 0.75 mg/dL (ref 0.61–1.24)
GFR, Estimated: >60 mL/min (ref >60)
Glucose, Bld: 121 mg/dL — ABNORMAL HIGH (ref 70–99)
Potassium: 3.3 mmol/L — ABNORMAL LOW (ref 3.5–5.1)
Sodium: 137 mmol/L (ref 135–145)

## 2021-10-29 LAB — SURGICAL PATHOLOGY

## 2021-10-29 LAB — HEMOGLOBIN AND HEMATOCRIT, BLOOD
HCT: 30.1 % — ABNORMAL LOW (ref 39.0–52.0)
Hemoglobin: 9.5 g/dL — ABNORMAL LOW (ref 13.0–17.0)

## 2021-10-29 MED ORDER — POTASSIUM CHLORIDE 10 MEQ/100ML IV SOLN
10.0000 meq | INTRAVENOUS | Status: AC
Start: 2021-10-29 — End: 2021-10-29
  Administered 2021-10-29 (×3): 10 meq via INTRAVENOUS
  Filled 2021-10-29 (×3): qty 100

## 2021-10-29 NOTE — Plan of Care (Signed)
  Problem: Clinical Measurements: Goal: Will remain free from infection Outcome: Progressing Goal: Respiratory complications will improve Outcome: Progressing   Problem: Activity: Goal: Risk for activity intolerance will decrease Outcome: Progressing   Problem: Nutrition: Goal: Adequate nutrition will be maintained Outcome: Progressing   Problem: Pain Managment: Goal: General experience of comfort will improve Outcome: Progressing   

## 2021-10-29 NOTE — Progress Notes (Signed)
Pt ambulated 169ft today. Tolerated well.

## 2021-10-29 NOTE — Progress Notes (Addendum)
4 Days Post-Op Subjective: Patient reports he had multiple bowel movements last night.  He said the nursing staff was a little frustrated with him.  I told him not to worry about it -BMs are a sign of return of his bowel function and excellent postop progress. JP drain output slowed.   Objective: Vital signs in last 24 hours: Temp:  [98 F (36.7 C)-98.7 F (37.1 C)] 98 F (36.7 C) (10/30 1341) Pulse Rate:  [65-99] 99 (10/30 1341) Resp:  [18] 18 (10/30 1341) BP: (108-133)/(60-80) 133/80 (10/30 1341) SpO2:  [91 %-99 %] 99 % (10/30 1341)  Intake/Output from previous day: 10/29 0701 - 10/30 0700 In: 3184.8 [P.O.:1500; I.V.:1684.8] Out: 1910 [Urine:1100; Drains:810] Intake/Output this shift: Total I/O In: -  Out: 90 [Drains:90]  Physical Exam:  He is up in a chair, watching TV, alert and oriented Cardiovascular-regular rate and rhythm Respiratory-regular effort and depth Abdomen-soft and nontender, ostomy pink and viable.  Incisions clean dry and intact.  Good bowel sounds per auscultation. Extremity-no calf pain or swelling    Lab Results: Recent Labs    10/27/21 0331 10/28/21 0348 10/29/21 0340  HGB 10.0* 9.1* 9.5*  HCT 32.2* 29.0* 30.1*   BMET Recent Labs    10/28/21 0348 10/29/21 0340  NA 136 137  K 3.7 3.3*  CL 106 107  CO2 25 25  GLUCOSE 117* 121*  BUN 12 10  CREATININE 0.61 0.75  CALCIUM 7.8* 7.8*   No results for input(s): LABPT, INR in the last 72 hours. No results for input(s): LABURIN in the last 72 hours. Results for orders placed or performed during the hospital encounter of 10/24/21  SARS Coronavirus 2 by RT PCR (hospital order, performed in [facility] hospital lab) Nasopharyngeal Nasopharyngeal Swab     Status: None   Collection Time: 10/24/21  4:46 PM   Specimen: Nasopharyngeal Swab  Result Value Ref Range Status   SARS Coronavirus 2 NEGATIVE NEGATIVE Final    Comment: (NOTE) SARS-CoV-2 target nucleic acids are NOT DETECTED.  The  SARS-CoV-2 RNA is generally detectable in upper and lower respiratory specimens during the acute phase of infection. The lowest concentration of SARS-CoV-2 viral copies this assay can detect is 250 copies / mL. A negative result does not preclude SARS-CoV-2 infection and should not be used as the sole basis for treatment or other patient management decisions.  A negative result may occur with improper specimen collection / handling, submission of specimen other than nasopharyngeal swab, presence of viral mutation(s) within the areas targeted by this assay, and inadequate number of viral copies (<250 copies / mL). A negative result must be combined with clinical observations, patient history, and epidemiological information.  Fact Sheet for Patients:   StrictlyIdeas.no  Fact Sheet for Healthcare Providers: BankingDealers.co.za  This test is not yet approved or  cleared by the [place] FDA and has been authorized for detection and/or diagnosis of SARS-CoV-2 by FDA under an Emergency Use Authorization (EUA).  This EUA will remain in effect (meaning this test can be used) for the duration of the COVID-19 declaration under Section 564(b)(1) of the Act, 21 U.S.C. section 360bbb-3(b)(1), unless the authorization is terminated or revoked sooner.  Performed at [facility], [place] [address], [place], [place] [address]   Surgical pcr screen     Status: None   Collection Time: 10/24/21  4:47 PM   Specimen: Nasopharyngeal Swab; Nasal Swab  Result Value Ref Range Status   MRSA, PCR NEGATIVE NEGATIVE Final  Staphylococcus aureus NEGATIVE NEGATIVE Final    Comment: (NOTE) The Xpert SA Assay (FDA approved for NASAL specimens in patients 72 years of age and older), is one component of a comprehensive surveillance program. It is not intended to diagnose infection nor to guide or monitor treatment. Performed at [facility], [place] [address], [place], [place] [address]     Studies/Results: No results found.  Assessment/Plan: Postop day 4 s/p robotic cystoprostatectomy w/ pelvic node dissection + ileal conduit diversion + extensive adhesioloysis + small bowel resection + cysto / ICG injection on 10/25/21.    Doing well.  Continue ambulation and physical therapy.  Given his return of bowel function we will advance to full liquids. -K replaced    LOS: 4 days   Festus Aloe 10/29/2021, 2:16 PM

## 2021-10-30 LAB — BASIC METABOLIC PANEL
Anion gap: 4 — ABNORMAL LOW (ref 5–15)
BUN: 9 mg/dL (ref 8–23)
CO2: 25 mmol/L (ref 22–32)
Calcium: 7.6 mg/dL — ABNORMAL LOW (ref 8.9–10.3)
Chloride: 107 mmol/L (ref 98–111)
Creatinine, Ser: 0.54 mg/dL — ABNORMAL LOW (ref 0.61–1.24)
GFR, Estimated: >60 mL/min (ref >60)
Glucose, Bld: 97 mg/dL (ref 70–99)
Potassium: 3.2 mmol/L — ABNORMAL LOW (ref 3.5–5.1)
Sodium: 136 mmol/L (ref 135–145)

## 2021-10-30 LAB — HEMOGLOBIN AND HEMATOCRIT, BLOOD
HCT: 25.1 % — ABNORMAL LOW (ref 39.0–52.0)
Hemoglobin: 8 g/dL — ABNORMAL LOW (ref 13.0–17.0)

## 2021-10-30 LAB — GLUCOSE, CAPILLARY: Glucose-Capillary: 119 mg/dL — ABNORMAL HIGH (ref 70–99)

## 2021-10-30 MED ORDER — FUROSEMIDE 10 MG/ML IJ SOLN
20.0000 mg | Freq: Once | INTRAMUSCULAR | Status: AC
  Administered 2021-10-30: 20 mg via INTRAVENOUS
  Filled 2021-10-30: qty 2

## 2021-10-30 NOTE — Evaluation (Signed)
Physical Therapy Evaluation Patient Details Name: William [name] MRN: [id number] DOB: 11-24-1941 Today's Date: 10/30/2021  History of Present Illness  Pt is a 80 year old male s/p robotic cystoprostatectomy + pelvic node dissection + conduit diversion + extensive adhesioloysis + small bowel resection + cysto / ICG injection on 10/25/21 due to bladder cancer.  PMHx significant for but not limited to tinnitus, Covid-19, back surgery    Clinical Impression  Patient is progressing well with mobility and ambulated ~200' with min guard/supervision for safety. He requires cues for posture throughout gait but maintains safe position to RW. Educated on exercises to facilitate circulation to reduce bil LE edema. Will continue to progress pt as able during acute stay.     Recommendations for follow up therapy are one component of a multi-disciplinary discharge planning process, led by the attending physician.  Recommendations may be updated based on patient status, additional functional criteria and insurance authorization.  Follow Up Recommendations Home health PT    Assistance Recommended at Discharge Set up Supervision/Assistance  Functional Status Assessment    Equipment Recommendations  Rolling walker (2 wheels)    Recommendations for Other Services       Precautions / Restrictions Precautions Precautions: Fall Precaution Comments: left JP drain, ileal conduit Restrictions Weight Bearing Restrictions: No      Mobility  Bed Mobility               General bed mobility comments: pt OOB in chair    Transfers Overall transfer level: Needs assistance Equipment used: Rolling walker (2 wheels) Transfers: Sit to/from Stand Sit to Stand: Min guard;Supervision           General transfer comment: guard/supervision for safety with rise from recliner. cue to reach back to control lowering to chair.    Ambulation/Gait Ambulation/Gait assistance: Min guard Gait Distance (Feet):  200 Feet Assistive device: Rolling walker (2 wheels) Gait Pattern/deviations: Step-through pattern;Decreased stride length;Trunk flexed Gait velocity: decr   General Gait Details: cues for safe proximity to RW, pt with step through pattern. Slightly flexed trunk and pt required cues for posture. Pt's SpO2 95% orb etter on RA and HR in 110-120's with alternating from norma rhythm to trigeminy and bigeminy. pt denied dizziness, lightheaded, chest pain, SOB.  Stairs            Wheelchair Mobility    Modified Rankin (Stroke Patients Only)       Balance Overall balance assessment: Mild deficits observed, not formally tested                                           Pertinent Vitals/Pain Pain Assessment: Faces Faces Pain Scale: Hurts little more Pain Location: scrotal edema Pain Descriptors / Indicators: Discomfort Pain Intervention(s): Limited activity within patient's tolerance;Monitored during session;Repositioned    Home Living                          Prior Function                       Hand Dominance        Extremity/Trunk Assessment                Communication      Cognition Arousal/Alertness: Awake/alert Behavior During Therapy: WFL for tasks assessed/performed Overall Cognitive Status: Within Functional  Limits for tasks assessed                                          General Comments      Exercises Other Exercises Other Exercises: bil LE heel/toe raises: 2x15 reps   Assessment/Plan    PT Assessment    PT Problem List         PT Treatment Interventions      PT Goals (Current goals can be found in the Care Plan section)  Acute Rehab PT Goals PT Goal Formulation: With patient Time For Goal Achievement: 11/21/2021 Potential to Achieve Goals: Good    Frequency Min 3X/week   Barriers to discharge        Co-evaluation               AM-PAC PT "6 Clicks" Mobility  Outcome  Measure Help needed turning from your back to your side while in a flat bed without using bedrails?: A Little Help needed moving from lying on your back to sitting on the side of a flat bed without using bedrails?: A Little Help needed moving to and from a bed to a chair (including a wheelchair)?: A Little Help needed standing up from a chair using your arms (e.g., wheelchair or bedside chair)?: A Little Help needed to walk in hospital room?: A Little Help needed climbing 3-5 steps with a railing? : A Lot 6 Click Score: 17    End of Session Equipment Utilized During Treatment: Gait belt Activity Tolerance: Patient tolerated treatment well Patient left: in chair;with call bell/phone within reach;with chair alarm set;with family/visitor present Nurse Communication: Mobility status PT Visit Diagnosis: Difficulty in walking, not elsewhere classified (R26.2)    Time: 7672-0947 PT Time Calculation (min) (ACUTE ONLY): 26 min   Charges:     PT Treatments $Gait Training: 8-22 mins $Therapeutic Exercise: 8-22 mins        Verner Mould, DPT Acute Rehabilitation Services Office [phone] Pager [phone]   Jacques Navy 10/30/2021, 5:24 PM

## 2021-10-30 NOTE — Progress Notes (Addendum)
5 Days Post-Op s/p robotic cystoprostatectomy w/ pelvic node dissection + ileal conduit diversion + extensive adhesioloysis + small bowel resection + cysto / ICG injection on 10/25/21.   Subjective: Responded well to Lasix 20mg  x 2 yesterday; swelling is much improved. 2 BM yesterday, ongoing flatus. Feeling a bit down about still being in the hospital.   Objective: Vital signs in last 24 hours: Temp:  [98 F (36.7 C)-99.3 F (37.4 C)] 99.3 F (37.4 C) (10/31 2022) Pulse Rate:  [56-107] 56 (10/31 2022) Resp:  [20] 20 (10/31 1232) BP: (114-132)/(67-71) 132/67 (10/31 2022) SpO2:  [94 %-97 %] 97 % (10/31 2022)  Intake/Output from previous day: 10/30 0701 - 10/31 0700 In: 1655.8 [P.O.:1200; I.V.:262.6; IV Piggyback:193.3] Out: 1120 [Urine:250; Drains:870] Intake/Output this shift: Total I/O In: -  Out: 600 [Urine:500; Drains:100]  Physical Exam:  General: Alert and oriented CV: RRR Lungs: Clear Abdomen: Soft, ND, RLQ urostomy pink and protruding, 2 stents present with pink tinged urine output. LLQ JP serosang.  Incisions: C/d/I, midline island dressing  GU: swelling significantly improved  Ext: NT, No erythema, bilateral lower extremity edema much improved, now just mild swelling in ankles.   Lab Results: Recent Labs    10/28/21 0348 10/29/21 0340 10/30/21 0356  HGB 9.1* 9.5* 8.0*  HCT 29.0* 30.1* 25.1*   BMET Recent Labs    10/29/21 0340 10/30/21 0356  NA 137 136  K 3.3* 3.2*  CL 107 107  CO2 25 25  GLUCOSE 121* 97  BUN 10 9  CREATININE 0.75 0.54*  CALCIUM 7.8* 7.6*     Studies/Results: No results found.  Assessment/Plan: -K replaced  -Regular diet  -Scheduled tylenol, prn oxycodone, dilaudid for breakthrough -Continue PT -Ostomy teaching -OOB and ambulating  -IS -On lovenox for DVT prophylaxis -Will check JP creatinine prior to discharge     LOS: 5 days   Katy Reines 10/30/2021, 9:59 PM   I have seen and examined the patient and agree with  Dr. Lahoma Crocker plan.     Briefly,   S: 1 - Bladder Cancer - s/p robotic cystoprostatectomy + pelvic node dissection + conduit diversion + extensive adhesioloysis + small bowel resection + cysto / ICG injection on 10/25/21. Path [id number]. Great prognosis. Admitted 10/25 for bowel prep, labs, stomal marking.   2 - Post-Op Ileus - pt with planned bowel anastamosis and also extensive bowel adhesiolysis as a part of surgery 10/26. NPO initially post-op. Received entereg peri-op. Ice chips POD1, Clears POD 2, Fulls POD 4, Regular diet POD5 as resumed vigorous bowel function.    3 - Disposition / Rehab - independent and lives with wife at baseline. Ostomy RN team working with in house. PT eval recs rolling walker for home and home health PT.    Today "Saiquan" is making progress. Pt and wife doing better with ostomy cahnges. Tollerating reg diet w/o emesis.   O: NAD, AOx3 vigorous for age Sinus 90s by monitor Non-labored breathing on RA SNTND, recent midline large surgical site and port sites c/d/I. JP serosanguinous / non-foul. RLQ Urostomy pink / patent with Rt (red) and Lt (blue) bander stents SCD's in place, no c/c/e.   A/P DC cardiac monitoring. Begin making arrangements towards DC, likely 11/3 based on current progress. Will need HHRN and HHPT for home.   Appreciate PT and ostomy RN teams.

## 2021-10-30 NOTE — Plan of Care (Signed)
  Problem: Education: Goal: Knowledge of General Education information will improve Description: Including pain rating scale, medication(s)/side effects and non-pharmacologic comfort measures Outcome: Progressing   Problem: Clinical Measurements: Goal: Will remain free from infection Outcome: Progressing Goal: Diagnostic test results will improve Outcome: Progressing Goal: Respiratory complications will improve Outcome: Progressing   Problem: Nutrition: Goal: Adequate nutrition will be maintained Outcome: Progressing   Problem: Pain Managment: Goal: General experience of comfort will improve Outcome: Progressing

## 2021-10-30 NOTE — Progress Notes (Signed)
5 Days Post-Op s/p robotic cystoprostatectomy w/ pelvic node dissection + ileal conduit diversion + extensive adhesioloysis + small bowel resection + cysto / ICG injection on 10/25/21.   Subjective: Had multiple bowel movements over the weekend. Tolerating full liquids, no nausea or emesis. Ambulating without difficulty. Over last 48 hours noticing swelling of his penis and scrotum.   Objective: Vital signs in last 24 hours: Temp:  [98 F (36.7 C)-98.6 F (37 C)] 98 F (36.7 C) (10/31 0509) Pulse Rate:  [85-102] 85 (10/31 0509) Resp:  [13-18] 13 (10/30 2000) BP: (114-133)/(62-80) 114/71 (10/31 0509) SpO2:  [94 %-99 %] 94 % (10/31 0509)  Intake/Output from previous day: 10/30 0701 - 10/31 0700 In: 1655.8 [P.O.:1200; I.V.:262.6; IV Piggyback:193.3] Out: 1120 [Urine:250; Drains:870] Intake/Output this shift: No intake/output data recorded.  Physical Exam:  General: Alert and oriented CV: RRR Lungs: Clear Abdomen: Soft, ND, RLQ urostomy pink and protruding, 2 stents present with pink tinged urine output. LLQ JP serosang.  Incisions: C/d/I, midline island dressing  GU: Soft tissue swelling of penis and scrotum  Ext: NT, No erythema, bilateral lower extremity edema to level of knees   Lab Results: Recent Labs    10/28/21 0348 10/29/21 0340 10/30/21 0356  HGB 9.1* 9.5* 8.0*  HCT 29.0* 30.1* 25.1*   BMET Recent Labs    10/29/21 0340 10/30/21 0356  NA 137 136  K 3.3* 3.2*  CL 107 107  CO2 25 25  GLUCOSE 121* 97  BUN 10 9  CREATININE 0.75 0.54*  CALCIUM 7.8* 7.6*     Studies/Results: No results found.  Assessment/Plan: -Continue full liquids, advancing as tolerated  -Stop fluids today; give 20mg  lasix for swelling -Scheduled tylenol, prn oxycodone, dilaudid for breakthrough -Continue PT -Ostomy teaching -OOB and ambulating  -IS -On lovenox for DVT prophylaxis    LOS: 5 days   Libni Fusaro 10/30/2021, 8:05 AM

## 2021-10-30 NOTE — Plan of Care (Signed)
  Problem: Education: Goal: Knowledge of General Education information will improve Description: Including pain rating scale, medication(s)/side effects and non-pharmacologic comfort measures Outcome: Progressing   Problem: Clinical Measurements: Goal: Diagnostic test results will improve Outcome: Progressing   Problem: Activity: Goal: Risk for activity intolerance will decrease Outcome: Progressing   

## 2021-10-30 NOTE — Consult Note (Signed)
[place] Nurse ostomy follow up Stoma type/location: RLQ, ileal conduit  Stomal assessment/size: 1 3/4" round, budded, 2 stents in place (red and blue) Peristomal assessment: intact ; contact dermatitis resolved; new area of MARSI noted at the edge of the postop visible dressing (between stoma and dressing edge  Treatment options for stomal/peristomal skin: continued use of skin barrier wipe  Output clear, yellow, urine Ostomy pouching: 1pc. Convex, 2" skin barrier ring  Education provided:  Met with patient; patient seems to be more flat today. Not participating as much Wife is a bit skittish with pouch change    Wife cut new skin barrier Friday; we had to trim a bit today to accommodate stoma. Explained need to re-measure stoma at least weekly. May be able to convert to pre-cut pouches patient has been asking about, however cautioned to wait for 3 months. Patient and wife will need support with pouch changes, still do not seem to understand that urine will come from stoma continuously.  Discussed how to use wick (papertowel, toilet paper, slim tampon) and that patient may be able to help wife with this while she changes pouch. Wife able to disconnect from bedside drainage; understands storage and cleaning of BSD Wife fearful to remove pouch with stents.  I have assisted and discussed again removal and protection of the stents Once pouch removed; wife did attempt to assist with placement of barrier ring but needed assistance Assisted wife to place new pouch; guiding stents into the pouch Wife attached BSD bag back to patient and opened pouch Will ask staff to work with patient on emptying  Enrolled patient in [place] Start Discharge program: Yes  [place] Nurse will follow along with you for continued support with ostomy teaching and care [place] MSN, [place], [place], [place], [place]

## 2021-10-31 LAB — BASIC METABOLIC PANEL
Anion gap: 7 (ref 5–15)
BUN: 9 mg/dL (ref 8–23)
CO2: 28 mmol/L (ref 22–32)
Calcium: 8.1 mg/dL — ABNORMAL LOW (ref 8.9–10.3)
Chloride: 104 mmol/L (ref 98–111)
Creatinine, Ser: 0.62 mg/dL (ref 0.61–1.24)
GFR, Estimated: >60 mL/min (ref >60)
Glucose, Bld: 97 mg/dL (ref 70–99)
Potassium: 2.9 mmol/L — ABNORMAL LOW (ref 3.5–5.1)
Sodium: 139 mmol/L (ref 135–145)

## 2021-10-31 LAB — HEMOGLOBIN AND HEMATOCRIT, BLOOD
HCT: 27.1 % — ABNORMAL LOW (ref 39.0–52.0)
Hemoglobin: 8.8 g/dL — ABNORMAL LOW (ref 13.0–17.0)

## 2021-10-31 MED ORDER — POTASSIUM CHLORIDE CRYS ER 20 MEQ PO TBCR
40.0000 meq | EXTENDED_RELEASE_TABLET | Freq: Once | ORAL | Status: AC
  Administered 2021-10-31: 40 meq via ORAL
  Filled 2021-10-31: qty 2

## 2021-10-31 MED ORDER — ORAL CARE MOUTH RINSE
15.0000 mL | Freq: Two times a day (BID) | OROMUCOSAL | Status: DC
  Administered 2021-10-31 – 2021-11-01 (×4): 15 mL via OROMUCOSAL

## 2021-10-31 NOTE — Progress Notes (Signed)
Provided teach-back education on how to detach drainage bag from ileostomy.  Patient verbalized and demonstrated understanding.  Angie Fava, RN

## 2021-10-31 NOTE — Progress Notes (Signed)
Pt c/o "not feeling good," pain in his abdomen and head after ambulating in hallway and returning to sit in chair.  Pt also received scheduled Tylenol and PRN oxycodone prior to complaint.  Assisted pt back to bed.  VS normal, oral temperature slightly elevated at 99.2.  Will continue to monitor.  Angie Fava, RN

## 2021-10-31 NOTE — Consult Note (Signed)
[place] Nurse ostomy follow up Stoma type/location: RLQ, ileal conduit  Stomal assessment/size: 1 3/4" round, budded, 2 stents in place  Peristomal assessment: NA Treatment options for stomal/peristomal skin: using 2" skin barrier ring  Output dark yellow urine  Ostomy pouching: 1pc.convex with 2" skin barrier ring Education provided:  Reviewed emptying; hooking to night time drainage with patient.  He is much more engaged today.  I have asked staff to ensure he empties pouch today at some point. Walked without BSD bag yesterday, however he does not report the sensation of fullness of the pouch.  Enrolled patient in [place] Start Discharge program: Yes  [place] Nurse will follow along with you for continued support with ostomy teaching and care [place] MSN, [place], [place], [place], [place]

## 2021-10-31 NOTE — Progress Notes (Signed)
Patient received second meal on regular diet (advanced).  Ate 50%: some eggs and fruit.  No c/o NV.  Angie Fava, RN

## 2021-11-01 ENCOUNTER — Ambulatory Visit: Payer: Medicare HMO | Admitting: Oncology

## 2021-11-01 ENCOUNTER — Other Ambulatory Visit: Payer: Medicare HMO

## 2021-11-01 LAB — BASIC METABOLIC PANEL
Anion gap: 6 (ref 5–15)
BUN: 10 mg/dL (ref 8–23)
CO2: 25 mmol/L (ref 22–32)
Calcium: 7.7 mg/dL — ABNORMAL LOW (ref 8.9–10.3)
Chloride: 104 mmol/L (ref 98–111)
Creatinine, Ser: 0.75 mg/dL (ref 0.61–1.24)
GFR, Estimated: >60 mL/min (ref >60)
Glucose, Bld: 101 mg/dL — ABNORMAL HIGH (ref 70–99)
Potassium: 3.2 mmol/L — ABNORMAL LOW (ref 3.5–5.1)
Sodium: 135 mmol/L (ref 135–145)

## 2021-11-01 LAB — CREATININE, FLUID (PLEURAL, PERITONEAL, JP DRAINAGE): Creat, Fluid: 0.6 mg/dL

## 2021-11-01 LAB — HEMOGLOBIN AND HEMATOCRIT, BLOOD
HCT: 27.5 % — ABNORMAL LOW (ref 39.0–52.0)
Hemoglobin: 8.6 g/dL — ABNORMAL LOW (ref 13.0–17.0)

## 2021-11-01 NOTE — Discharge Summary (Signed)
Date of admission: 10/24/2021  Date of discharge: 11/02/2021  Admission diagnosis: Bladder cancer   Discharge diagnosis: Bladder cancer   Secondary diagnoses:  Patient Active Problem List   Diagnosis Date Noted   Port-A-Cath in place 04/04/2021   Bladder cancer ([place]) 02/28/2021   Flank pain 12/01/2020   History of hematuria 12/01/2020   Gross hematuria with bladder mass 09/21/2020   Acute pain of left shoulder 09/21/2020   Numbness 01/27/2020   Pain 01/27/2020   Trigger finger of right thumb 01/27/2020   Tinnitus 03/11/2019   Dizziness 06/12/2018   Chronic nonintractable headache 06/12/2018   Syncope 06/01/2018   Spider angioma 09/18/2017   Tremor of right hand 08/23/2017   Bilateral arm numbness and tingling while sleeping 02/08/2017   Dyspnea on exertion 02/07/2017   Allergic rhinitis 02/07/2017   Right leg numbness 02/07/2017   Enlarged lymph nodes 08/09/2016   Health care maintenance 08/09/2016   Plantar wart of left foot 06/28/2016   Lateral pain of right hip 12/20/2015   Left shoulder pain 11/29/2015   Finger pain, left 05/23/2015   Plantar wart of right foot 03/15/2015   Acute low back pain 12/27/2014   Left wrist pain 12/13/2014   Superficial bruising 08/04/2014   Toenail fungus 08/04/2014   AK (actinic keratosis) 08/04/2014   Seborrheic dermatitis of scalp 08/04/2014   Bilateral lower extremity edema 11/16/2013   Screening for colon cancer 09/28/2013   Impotence due to erectile dysfunction 08/26/2013   Radiculopathy with lower extremity symptoms 06/03/2013   Screening for abdominal aortic aneurysm 07/31/2012   Asthma-COPD overlap syndrome ([place]) 04/10/2012   KNEE PAIN, LEFT 05/16/2010   Hyperlipidemia 04/29/2007   Hyperplasia of prostate 02/27/2007   Spinal stenosis of lumbar region 02/27/2007    Procedures performed: Procedure(s): XI ROBOTIC ASSISTED LAPAROSCOPIC COMPLETE CYSTECT ILEAL CONDUIT/RADICAL PROSTATECTOMY AND SMALL BOWEL RESECTION LYMPH NODE  DISSECTION CYSTOSCOPY WITH INJECTION OF INDOCYANINE GREEN DYE EXTENSIVE LAPAROSCOPIC LYSIS OF ADHESIONS  History and Physical: For full details, please see admission history and physical. Briefly, William [name] is a 80 y.o. year old patient with history of muscle invasive bladder cancer without metastatic disease s/p neoadjuvant gem-cis who presented for planned robotic cystectomy with ileal conduit.   Hospital Course: Patient tolerated the procedure well.  He was then transferred to the floor after an uneventful PACU stay.  His hospital course was uncomplicated. Given enterotomies and small bowel resection, that patient's diet was slowly advanced awaiting return of bowel function. He was initially NPO then POD1 ice chips, clears POD2, fulls POD4 and regular diet by POD5. Patient  worked with PT during admission who recommended a rolling walker and home health PT. He was walking independently at time of discharge. Patient worked with ostomy and was able to change his pouch independently with the assistance of his wife prior to discharge. Patient did have some diffuse soft tissue edema by POD5 but this significantly improved with IV lasix 64m x 2. He remained on lovenox for DVT prophylaxis while inpatient. Patient was discharged with 2 stents in place in stoma which was pink and protruding. JP creatine was negative so this was removed prior to discharge.   On POD#8 he had met discharge criteria: was eating a regular diet, was up and ambulating independently,  pain was well controlled, had good urine output from his urostomy and was ready to for discharge. He was discharged with rx for percocet for pain control.    Laboratory values:  Recent Labs  10/31/21 0350 11/01/21 0342 11/02/21 0346  HGB 8.8* 8.6* 10.5*  HCT 27.1* 27.5* 32.8*   Recent Labs    10/31/21 0350 11/01/21 0342 11/02/21 0346  NA 139 135 134*  K 2.9* 3.2* 3.4*  CL 104 104 106  CO2 _0 GLUCOSE 97 101* 124*  BUN _1 CREATININE 0.62 0.75 0.64  CALCIUM 8.1* 7.7* 8.2*   No results for input(s): LABPT, INR in the last 72 hours. No results for input(s): LABURIN in the last 72 hours. Results for orders placed or performed during the hospital encounter of 10/24/21  SARS Coronavirus 2 by RT PCR (hospital order, performed in [facility] hospital lab) Nasopharyngeal Nasopharyngeal Swab     Status: None   Collection Time: 10/24/21  4:46 PM   Specimen: Nasopharyngeal Swab  Result Value Ref Range Status   SARS Coronavirus 2 NEGATIVE NEGATIVE Final    Comment: (NOTE) SARS-CoV-2 target nucleic acids are NOT DETECTED.  The SARS-CoV-2 RNA is generally detectable in upper and lower respiratory specimens during the acute phase of infection. The lowest concentration of SARS-CoV-2 viral copies this assay can detect is 250 copies / mL. A negative result does not preclude SARS-CoV-2 infection and should not be used as the sole basis for treatment or other patient management decisions.  A negative result may occur with improper specimen collection / handling, submission of specimen other than nasopharyngeal swab, presence of viral mutation(s) within the areas targeted by this assay, and inadequate number of viral copies (<250 copies / mL). A negative result must be combined with clinical observations, patient history, and epidemiological information.  Fact Sheet for Patients:   StrictlyIdeas.no  Fact Sheet for Healthcare Providers: BankingDealers.co.za  This test is not yet approved or  cleared by the [place] FDA and has been authorized for detection and/or diagnosis of SARS-CoV-2 by FDA under an Emergency Use Authorization (EUA).  This EUA will remain in effect (meaning this test can be used) for the duration of the COVID-19 declaration under Section 564(b)(1) of the Act, 21 U.S.C. section 360bbb-3(b)(1), unless the authorization is terminated or revoked  sooner.  Performed at [facility], [place] [address], [place], [place] [address]   Surgical pcr screen     Status: None   Collection Time: 10/24/21  4:47 PM   Specimen: Nasopharyngeal Swab; Nasal Swab  Result Value Ref Range Status   MRSA, PCR NEGATIVE NEGATIVE Final   Staphylococcus aureus NEGATIVE NEGATIVE Final    Comment: (NOTE) The Xpert SA Assay (FDA approved for NASAL specimens in patients 6 years of age and older), is one component of a comprehensive surveillance program. It is not intended to diagnose infection nor to guide or monitor treatment. Performed at [facility], [place] [address], [place], [place] [address]     Disposition: Home  Discharge instruction: The patient was instructed to be ambulatory but told to refrain from heavy lifting, strenuous activity, or driving.   Discharge medications:  Allergies as of 11/02/2021       Reactions   Hydrocodone Other (See Comments)   Causes constipation even with stool softner        Medication List     TAKE these medications    b complex vitamins tablet Take 1 tablet by mouth daily.   fluticasone 50 MCG/ACT nasal spray Commonly known as: FLONASE Place 2 sprays into both nostrils daily.   gabapentin 300 MG capsule Commonly known as: NEURONTIN TAKE 3  CAPSULES BY MOUTH AT BEDTIME   Geritol Liqd Take 5 mLs by mouth.   multivitamin with minerals Tabs tablet Take 1 tablet by mouth daily.   OMEGA-3 FATTY ACIDS PO Take by mouth.   oxyCODONE-acetaminophen 5-325 MG tablet Commonly known as: Percocet Take 1 tablet by mouth every 6 (six) hours as needed for severe pain.   Spiriva HandiHaler 18 MCG inhalation capsule Generic drug: tiotropium PLACE 1 CAPSULE( 18 MCG TOTAL) INTO INHALER AND INHALE DAILY   tiZANidine 4 MG tablet Commonly known as: ZANAFLEX Take 4 mg by mouth at bedtime.               Durable Medical Equipment  (From admission, onward)            Start     Ordered   11/01/21 1157  For home use only DME Walker rolling  Once       Question Answer Comment  Walker: Other   Comments rolator with seat   Patient needs a walker to treat with the following condition Gait instability      11/01/21 1158            Followup:   Follow-up Information     [name], MD Follow up on 11/21/2021.   Specialty: Urology Why: at 9:30 AM for MD visit. Contact information: [place] [address] [phone]         Health, [place] Follow up.   Specialty: [place] Why: they will call for home visit Contact information: [place] [address] [phone]

## 2021-11-01 NOTE — TOC Transition Note (Addendum)
Transition of Care [facility]) - CM/SW Discharge Note   Patient Details  Name: William [name] MRN: [id number] Date of Birth: 07-Dec-1941  [facility] CM/SW Contact:  Leeroy Cha, RN Phone Number: 11/01/2021, 9:41 AM   Clinical Narrative:    Dlcd to return home with hhc through [place] rn and pt rolling walker ordered through [employer] Pt wants a rolator walker instead of  rolling [employer] notified.  Final next level of care: [place] Barriers to Discharge: No Barriers Identified   Patient Goals and CMS Choice Patient states their goals for this hospitalization and ongoing recovery are:: To get better and return home CMS Medicare.gov Compare Post Acute Care list provided to:: Patient Choice offered to / list presented to : Patient  Discharge Placement                       Discharge Plan and Services     Post Acute Care Choice: Home Health, Durable Medical Equipment          DME Arranged: Walker rolling DME Agency: [employer] Date DME Agency Contacted: 11/01/21 Time DME Agency Contacted: [phone] Representative spoke with at DME Agency: Blenda Nicely HH Arranged: RN, PT [place] Agency: [place] Date [place]: 11/01/21 Time [place]: [phone] Representative spoke with at [place]: [place] Determinants of Health ([place]) Interventions     Readmission Risk Interventions No flowsheet data found.

## 2021-11-01 NOTE — Care Management Important Message (Signed)
Important Message  Patient Details IM Letter given to the Patient. Name: William [name] MRN: [id number] Date of Birth: 08-17-1941   Medicare Important Message Given:  Yes     Kerin Salen 11/01/2021, 12:18 PM

## 2021-11-01 NOTE — Progress Notes (Signed)
7 Days Post-Op s/p robotic cystoprostatectomy w/ pelvic node dissection + ileal conduit diversion + extensive adhesioloysis + small bowel resection + cysto / ICG injection on 10/25/21.   Subjective: Had a good day yesterday. Tried to get up overnight and the bed kept alarming, otherwise no complaints. Wants to try changing his pouch independently today. Would like to discharge tomorrow morning.   Objective: Vital signs in last 24 hours: Temp:  [97.9 F (36.6 C)-99.2 F (37.3 C)] 97.9 F (36.6 C) (11/02 0443) Pulse Rate:  [80-105] 80 (11/02 0443) Resp:  [16-20] 20 (11/02 0443) BP: (112-118)/(62-71) 112/70 (11/02 0443) SpO2:  [95 %-97 %] 95 % (11/02 0443)  Intake/Output from previous day: 11/01 0701 - 11/02 0700 In: 900 [P.O.:900] Out: 1360 [Urine:725; Drains:635] Intake/Output this shift: No intake/output data recorded.  Physical Exam:  General: Alert and oriented CV: RRR Lungs: Clear Abdomen: Soft, ND, RLQ urostomy pink and protruding, 2 stents present with pink tinged urine output. LLQ JP serosang.  Incisions: C/d/I, midline island dressing  GU: swelling significantly improved  Ext: NT, No erythema, mild ankle swelling L >R.   Lab Results: Recent Labs    10/30/21 0356 10/31/21 0350 11/01/21 0342  HGB 8.0* 8.8* 8.6*  HCT 25.1* 27.1* 27.5*   BMET Recent Labs    10/31/21 0350 11/01/21 0342  NA 139 135  K 2.9* 3.2*  CL 104 104  CO2 28 25  GLUCOSE 97 101*  BUN 9 10  CREATININE 0.62 0.75  CALCIUM 8.1* 7.7*     Studies/Results: No results found.  Assessment/Plan: 7 Days Post-Op s/p robotic cystoprostatectomy w/ pelvic node dissection + ileal conduit diversion + extensive adhesioloysis + small bowel resection + cysto / ICG injection on 10/25/21. Recovering well now normalized with robust return of bowel function.   -Plan for discharge tomorrow am -Will need HH and HHPT and ostomy supplies for home -Scheduled tylenol, prn oxycodone -On lovenox for DVT  prophylaxis -Will check JP creatinine creatinine, if negative will remove prior to discharge -Plan for independent pouch change today   LOS: 7 days   [place] 11/01/2021, 8:04 AM

## 2021-11-01 NOTE — Progress Notes (Signed)
Physical Therapy Treatment Patient Details Name: William [name] MRN: [id number] DOB: 24-Sep-1941 Today's Date: 11/01/2021   History of Present Illness Pt is a 80 year old male s/p robotic cystoprostatectomy + pelvic node dissection + conduit diversion + extensive adhesioloysis + small bowel resection + cysto / ICG injection on 10/25/21 due to bladder cancer.  PMHx significant for but not limited to tinnitus, Covid-19, back surgery    PT Comments    Pt in bed awaiting Ostomy RN for teaching.  Spouse at bedside.  Assisted OOB to amb 1/2 unit with walker.  General Gait Details: tolerated an increased distance.  Good alternating gait with minimal lean on walker.  Pt would like a Rollator walker for D/C for community use.  Pt plans to return home with spouse.    Recommendations for follow up therapy are one component of a multi-disciplinary discharge planning process, led by the attending physician.  Recommendations may be updated based on patient status, additional functional criteria and insurance authorization.  Follow Up Recommendations  Home health PT     Assistance Recommended at Discharge Set up [place] (4 wheels)    Recommendations for Other Services       Precautions / Restrictions Precautions Precautions: Fall Precaution Comments: left JP drain,     Mobility  Bed Mobility Overal bed mobility: Needs Assistance Bed Mobility: Supine to Sit;Sit to Supine     Supine to sit: Min assist Sit to supine: Mod assist   General bed mobility comments: increased time and effort due to pain level    Transfers Overall transfer level: Needs assistance Equipment used: Rolling walker (2 wheels) Transfers: Sit to/from Stand Sit to Stand: Min guard;Supervision           General transfer comment: increased time but oog use of hands to steady self and rise.    Ambulation/Gait Ambulation/Gait assistance: Min  guard;Supervision Gait Distance (Feet): 275 Feet Assistive device: Rolling walker (2 wheels) Gait Pattern/deviations: Step-through pattern;Decreased stride length;Trunk flexed Gait velocity: decreased   General Gait Details: tolerated an increased distance.  Good alternating gait with minimal lean on walker.  Pt would like a Rollator walker for D/C for community use.   Stairs             Wheelchair Mobility    Modified Rankin (Stroke Patients Only)       Balance                                            Cognition   Behavior During Therapy: WFL for tasks assessed/performed Overall Cognitive Status: Within Functional Limits for tasks assessed                                          Exercises      General Comments        Pertinent Vitals/Pain Pain Assessment: Faces Faces Pain Scale: Hurts little more Pain Location: ABD Pain Descriptors / Indicators: Discomfort Pain Intervention(s): Monitored during session    Home Living                          Prior Function            PT Goals (current goals  can now be found in the care plan section) Progress towards PT goals: Progressing toward goals    Frequency    Min 3X/week      PT Plan Current plan remains appropriate    Co-evaluation              AM-PAC PT "6 Clicks" Mobility   Outcome Measure  Help needed turning from your back to your side while in a flat bed without using bedrails?: A Little Help needed moving from lying on your back to sitting on the side of a flat bed without using bedrails?: A Little Help needed moving to and from a bed to a chair (including a wheelchair)?: A Little Help needed standing up from a chair using your arms (e.g., wheelchair or bedside chair)?: A Little Help needed to walk in hospital room?: A Little Help needed climbing 3-5 steps with a railing? : A Little 6 Click Score: 18    End of Session Equipment  Utilized During Treatment: Gait belt Activity Tolerance: Patient tolerated treatment well Patient left: in bed;with call bell/phone within reach;with family/visitor present Nurse Communication: Mobility status PT Visit Diagnosis: Difficulty in walking, not elsewhere classified (R26.2)     Time: 9833-8250 PT Time Calculation (min) (ACUTE ONLY): 17 min  Charges:  $Gait Training: 8-22 mins                     Rica Koyanagi  PTA Acute  Rehabilitation Services Pager      [phone] Office      [phone]

## 2021-11-02 LAB — BASIC METABOLIC PANEL
Anion gap: 3 — ABNORMAL LOW (ref 5–15)
BUN: 13 mg/dL (ref 8–23)
CO2: 25 mmol/L (ref 22–32)
Calcium: 8.2 mg/dL — ABNORMAL LOW (ref 8.9–10.3)
Chloride: 106 mmol/L (ref 98–111)
Creatinine, Ser: 0.64 mg/dL (ref 0.61–1.24)
GFR, Estimated: >60 mL/min (ref >60)
Glucose, Bld: 124 mg/dL — ABNORMAL HIGH (ref 70–99)
Potassium: 3.4 mmol/L — ABNORMAL LOW (ref 3.5–5.1)
Sodium: 134 mmol/L — ABNORMAL LOW (ref 135–145)

## 2021-11-02 LAB — HEMOGLOBIN AND HEMATOCRIT, BLOOD
HCT: 32.8 % — ABNORMAL LOW (ref 39.0–52.0)
Hemoglobin: 10.5 g/dL — ABNORMAL LOW (ref 13.0–17.0)

## 2021-11-02 MED ORDER — OXYCODONE-ACETAMINOPHEN 5-325 MG PO TABS: 1.0000 | ORAL_TABLET | Freq: Four times a day (QID) | ORAL | 0 refills | Status: AC | PRN

## 2021-11-02 NOTE — Progress Notes (Signed)
Pt discharged to home in stable condition accompanied by wife and son. JP removed per orders. AVS reviewed; pt and wife deny further questions or concerns at this time. Ostomy clinic number provided to patient per wife's request. Coolidge Breeze, RN 11/02/2021

## 2021-11-04 ENCOUNTER — Telehealth: Payer: Self-pay | Admitting: Student

## 2021-11-04 NOTE — Telephone Encounter (Signed)
Returned patient call to on call pager.  Spoke with patient and his wife. Patient having persistent lower extremity and scrotal edema. Otherwise no new symptoms. No abdominal pain, having Bms. He is ambulating several times daily. He had similar swelling while in the hospital which improved. He states leg swelling is bilateral. We discussed continuting to monitor his swelling over the weekend, if significantly worsens to call back. Discussed significance of unilateral swelling. Understands to call us if he develops new pain, shortness of breath. He is comfortable and would prefer to monitor at home for now.

## 2021-11-09 ENCOUNTER — Other Ambulatory Visit: Payer: Self-pay

## 2021-11-09 ENCOUNTER — Inpatient Hospital Stay (HOSPITAL_COMMUNITY): Payer: Medicare HMO

## 2021-11-09 ENCOUNTER — Encounter (HOSPITAL_COMMUNITY): Payer: Self-pay | Admitting: Emergency Medicine

## 2021-11-09 ENCOUNTER — Emergency Department (HOSPITAL_COMMUNITY): Payer: Medicare HMO

## 2021-11-09 ENCOUNTER — Inpatient Hospital Stay (HOSPITAL_COMMUNITY)
Admission: EM | Admit: 2021-11-09 | Discharge: 2021-11-30 | DRG: 862 | Disposition: E | Payer: Medicare HMO | Attending: Internal Medicine | Admitting: Internal Medicine

## 2021-11-09 DIAGNOSIS — R609 Edema, unspecified: Secondary | ICD-10-CM | POA: Diagnosis not present

## 2021-11-09 DIAGNOSIS — G9340 Encephalopathy, unspecified: Secondary | ICD-10-CM | POA: Diagnosis present

## 2021-11-09 DIAGNOSIS — R0600 Dyspnea, unspecified: Secondary | ICD-10-CM

## 2021-11-09 DIAGNOSIS — E860 Dehydration: Secondary | ICD-10-CM | POA: Diagnosis present

## 2021-11-09 DIAGNOSIS — K668 Other specified disorders of peritoneum: Secondary | ICD-10-CM | POA: Diagnosis not present

## 2021-11-09 DIAGNOSIS — Y848 Other medical procedures as the cause of abnormal reaction of the patient, or of later complication, without mention of misadventure at the time of the procedure: Secondary | ICD-10-CM | POA: Diagnosis not present

## 2021-11-09 DIAGNOSIS — B961 Klebsiella pneumoniae [K. pneumoniae] as the cause of diseases classified elsewhere: Secondary | ICD-10-CM | POA: Diagnosis present

## 2021-11-09 DIAGNOSIS — M47816 Spondylosis without myelopathy or radiculopathy, lumbar region: Secondary | ICD-10-CM | POA: Diagnosis not present

## 2021-11-09 DIAGNOSIS — J982 Interstitial emphysema: Secondary | ICD-10-CM | POA: Diagnosis present

## 2021-11-09 DIAGNOSIS — Z885 Allergy status to narcotic agent status: Secondary | ICD-10-CM

## 2021-11-09 DIAGNOSIS — M7989 Other specified soft tissue disorders: Secondary | ICD-10-CM | POA: Diagnosis not present

## 2021-11-09 DIAGNOSIS — Z9221 Personal history of antineoplastic chemotherapy: Secondary | ICD-10-CM

## 2021-11-09 DIAGNOSIS — R066 Hiccough: Secondary | ICD-10-CM | POA: Diagnosis not present

## 2021-11-09 DIAGNOSIS — R231 Pallor: Secondary | ICD-10-CM | POA: Diagnosis not present

## 2021-11-09 DIAGNOSIS — Z981 Arthrodesis status: Secondary | ICD-10-CM | POA: Diagnosis not present

## 2021-11-09 DIAGNOSIS — K5939 Other megacolon: Secondary | ICD-10-CM | POA: Diagnosis not present

## 2021-11-09 DIAGNOSIS — G8929 Other chronic pain: Secondary | ICD-10-CM | POA: Diagnosis present

## 2021-11-09 DIAGNOSIS — R531 Weakness: Secondary | ICD-10-CM | POA: Diagnosis not present

## 2021-11-09 DIAGNOSIS — E43 Unspecified severe protein-calorie malnutrition: Secondary | ICD-10-CM | POA: Diagnosis not present

## 2021-11-09 DIAGNOSIS — R7401 Elevation of levels of liver transaminase levels: Secondary | ICD-10-CM | POA: Diagnosis present

## 2021-11-09 DIAGNOSIS — N151 Renal and perinephric abscess: Secondary | ICD-10-CM | POA: Diagnosis present

## 2021-11-09 DIAGNOSIS — N1330 Unspecified hydronephrosis: Secondary | ICD-10-CM | POA: Diagnosis not present

## 2021-11-09 DIAGNOSIS — J9811 Atelectasis: Secondary | ICD-10-CM | POA: Diagnosis not present

## 2021-11-09 DIAGNOSIS — B952 Enterococcus as the cause of diseases classified elsewhere: Secondary | ICD-10-CM | POA: Diagnosis present

## 2021-11-09 DIAGNOSIS — Z452 Encounter for adjustment and management of vascular access device: Secondary | ICD-10-CM | POA: Diagnosis not present

## 2021-11-09 DIAGNOSIS — Z0189 Encounter for other specified special examinations: Secondary | ICD-10-CM

## 2021-11-09 DIAGNOSIS — Z79899 Other long term (current) drug therapy: Secondary | ICD-10-CM

## 2021-11-09 DIAGNOSIS — B377 Candidal sepsis: Secondary | ICD-10-CM

## 2021-11-09 DIAGNOSIS — Z6831 Body mass index (BMI) 31.0-31.9, adult: Secondary | ICD-10-CM

## 2021-11-09 DIAGNOSIS — E8809 Other disorders of plasma-protein metabolism, not elsewhere classified: Secondary | ICD-10-CM | POA: Diagnosis present

## 2021-11-09 DIAGNOSIS — Z936 Other artificial openings of urinary tract status: Secondary | ICD-10-CM

## 2021-11-09 DIAGNOSIS — E878 Other disorders of electrolyte and fluid balance, not elsewhere classified: Secondary | ICD-10-CM | POA: Diagnosis present

## 2021-11-09 DIAGNOSIS — Z8249 Family history of ischemic heart disease and other diseases of the circulatory system: Secondary | ICD-10-CM

## 2021-11-09 DIAGNOSIS — N179 Acute kidney failure, unspecified: Secondary | ICD-10-CM | POA: Diagnosis not present

## 2021-11-09 DIAGNOSIS — E871 Hypo-osmolality and hyponatremia: Secondary | ICD-10-CM | POA: Diagnosis present

## 2021-11-09 DIAGNOSIS — J690 Pneumonitis due to inhalation of food and vomit: Secondary | ICD-10-CM | POA: Diagnosis not present

## 2021-11-09 DIAGNOSIS — I4891 Unspecified atrial fibrillation: Secondary | ICD-10-CM | POA: Diagnosis not present

## 2021-11-09 DIAGNOSIS — J811 Chronic pulmonary edema: Secondary | ICD-10-CM | POA: Diagnosis not present

## 2021-11-09 DIAGNOSIS — Z8616 Personal history of COVID-19: Secondary | ICD-10-CM | POA: Diagnosis not present

## 2021-11-09 DIAGNOSIS — R008 Other abnormalities of heart beat: Secondary | ICD-10-CM | POA: Diagnosis present

## 2021-11-09 DIAGNOSIS — J9601 Acute respiratory failure with hypoxia: Secondary | ICD-10-CM | POA: Diagnosis not present

## 2021-11-09 DIAGNOSIS — B49 Unspecified mycosis: Secondary | ICD-10-CM | POA: Diagnosis not present

## 2021-11-09 DIAGNOSIS — Z4682 Encounter for fitting and adjustment of non-vascular catheter: Secondary | ICD-10-CM | POA: Diagnosis not present

## 2021-11-09 DIAGNOSIS — Z95828 Presence of other vascular implants and grafts: Secondary | ICD-10-CM

## 2021-11-09 DIAGNOSIS — D62 Acute posthemorrhagic anemia: Secondary | ICD-10-CM | POA: Diagnosis present

## 2021-11-09 DIAGNOSIS — J849 Interstitial pulmonary disease, unspecified: Secondary | ICD-10-CM | POA: Diagnosis not present

## 2021-11-09 DIAGNOSIS — Z906 Acquired absence of other parts of urinary tract: Secondary | ICD-10-CM

## 2021-11-09 DIAGNOSIS — N3000 Acute cystitis without hematuria: Secondary | ICD-10-CM | POA: Diagnosis not present

## 2021-11-09 DIAGNOSIS — K567 Ileus, unspecified: Secondary | ICD-10-CM | POA: Diagnosis not present

## 2021-11-09 DIAGNOSIS — Z20822 Contact with and (suspected) exposure to covid-19: Secondary | ICD-10-CM | POA: Diagnosis not present

## 2021-11-09 DIAGNOSIS — R7881 Bacteremia: Secondary | ICD-10-CM | POA: Diagnosis present

## 2021-11-09 DIAGNOSIS — J9690 Respiratory failure, unspecified, unspecified whether with hypoxia or hypercapnia: Secondary | ICD-10-CM | POA: Diagnosis not present

## 2021-11-09 DIAGNOSIS — C679 Malignant neoplasm of bladder, unspecified: Secondary | ICD-10-CM | POA: Diagnosis present

## 2021-11-09 DIAGNOSIS — N170 Acute kidney failure with tubular necrosis: Secondary | ICD-10-CM | POA: Diagnosis present

## 2021-11-09 DIAGNOSIS — K56600 Partial intestinal obstruction, unspecified as to cause: Secondary | ICD-10-CM | POA: Diagnosis not present

## 2021-11-09 DIAGNOSIS — K56699 Other intestinal obstruction unspecified as to partial versus complete obstruction: Secondary | ICD-10-CM | POA: Diagnosis not present

## 2021-11-09 DIAGNOSIS — T8144XA Sepsis following a procedure, initial encounter: Principal | ICD-10-CM | POA: Diagnosis present

## 2021-11-09 DIAGNOSIS — Z87891 Personal history of nicotine dependence: Secondary | ICD-10-CM

## 2021-11-09 DIAGNOSIS — B001 Herpesviral vesicular dermatitis: Secondary | ICD-10-CM | POA: Diagnosis not present

## 2021-11-09 DIAGNOSIS — I700 Atherosclerosis of aorta: Secondary | ICD-10-CM | POA: Diagnosis not present

## 2021-11-09 DIAGNOSIS — N136 Pyonephrosis: Secondary | ICD-10-CM | POA: Diagnosis present

## 2021-11-09 DIAGNOSIS — R6521 Severe sepsis with septic shock: Secondary | ICD-10-CM | POA: Diagnosis not present

## 2021-11-09 DIAGNOSIS — B370 Candidal stomatitis: Secondary | ICD-10-CM | POA: Diagnosis not present

## 2021-11-09 DIAGNOSIS — A419 Sepsis, unspecified organism: Secondary | ICD-10-CM | POA: Diagnosis present

## 2021-11-09 DIAGNOSIS — E875 Hyperkalemia: Secondary | ICD-10-CM | POA: Diagnosis not present

## 2021-11-09 DIAGNOSIS — R0902 Hypoxemia: Secondary | ICD-10-CM

## 2021-11-09 DIAGNOSIS — K651 Peritoneal abscess: Secondary | ICD-10-CM

## 2021-11-09 DIAGNOSIS — Z515 Encounter for palliative care: Secondary | ICD-10-CM

## 2021-11-09 DIAGNOSIS — Z978 Presence of other specified devices: Secondary | ICD-10-CM

## 2021-11-09 DIAGNOSIS — E870 Hyperosmolality and hypernatremia: Secondary | ICD-10-CM | POA: Diagnosis not present

## 2021-11-09 DIAGNOSIS — I517 Cardiomegaly: Secondary | ICD-10-CM | POA: Diagnosis not present

## 2021-11-09 DIAGNOSIS — A4159 Other Gram-negative sepsis: Secondary | ICD-10-CM | POA: Diagnosis present

## 2021-11-09 DIAGNOSIS — I959 Hypotension, unspecified: Secondary | ICD-10-CM | POA: Diagnosis not present

## 2021-11-09 DIAGNOSIS — J90 Pleural effusion, not elsewhere classified: Secondary | ICD-10-CM | POA: Diagnosis not present

## 2021-11-09 DIAGNOSIS — E785 Hyperlipidemia, unspecified: Secondary | ICD-10-CM | POA: Diagnosis present

## 2021-11-09 DIAGNOSIS — Z66 Do not resuscitate: Secondary | ICD-10-CM | POA: Diagnosis not present

## 2021-11-09 DIAGNOSIS — Z4659 Encounter for fitting and adjustment of other gastrointestinal appliance and device: Secondary | ICD-10-CM

## 2021-11-09 DIAGNOSIS — Z7189 Other specified counseling: Secondary | ICD-10-CM | POA: Diagnosis not present

## 2021-11-09 DIAGNOSIS — R579 Shock, unspecified: Secondary | ICD-10-CM

## 2021-11-09 DIAGNOSIS — I82611 Acute embolism and thrombosis of superficial veins of right upper extremity: Secondary | ICD-10-CM | POA: Diagnosis not present

## 2021-11-09 DIAGNOSIS — R1110 Vomiting, unspecified: Secondary | ICD-10-CM | POA: Diagnosis not present

## 2021-11-09 DIAGNOSIS — N390 Urinary tract infection, site not specified: Secondary | ICD-10-CM | POA: Diagnosis present

## 2021-11-09 DIAGNOSIS — R0602 Shortness of breath: Secondary | ICD-10-CM | POA: Diagnosis not present

## 2021-11-09 DIAGNOSIS — B9689 Other specified bacterial agents as the cause of diseases classified elsewhere: Secondary | ICD-10-CM | POA: Diagnosis present

## 2021-11-09 DIAGNOSIS — E8779 Other fluid overload: Secondary | ICD-10-CM | POA: Diagnosis not present

## 2021-11-09 DIAGNOSIS — M25421 Effusion, right elbow: Secondary | ICD-10-CM | POA: Diagnosis not present

## 2021-11-09 DIAGNOSIS — M545 Low back pain, unspecified: Secondary | ICD-10-CM | POA: Diagnosis present

## 2021-11-09 DIAGNOSIS — K6389 Other specified diseases of intestine: Secondary | ICD-10-CM | POA: Diagnosis not present

## 2021-11-09 DIAGNOSIS — E876 Hypokalemia: Secondary | ICD-10-CM | POA: Diagnosis not present

## 2021-11-09 DIAGNOSIS — R9431 Abnormal electrocardiogram [ECG] [EKG]: Secondary | ICD-10-CM | POA: Diagnosis not present

## 2021-11-09 DIAGNOSIS — I5033 Acute on chronic diastolic (congestive) heart failure: Secondary | ICD-10-CM | POA: Diagnosis not present

## 2021-11-09 DIAGNOSIS — R109 Unspecified abdominal pain: Secondary | ICD-10-CM | POA: Diagnosis not present

## 2021-11-09 DIAGNOSIS — I2510 Atherosclerotic heart disease of native coronary artery without angina pectoris: Secondary | ICD-10-CM | POA: Diagnosis present

## 2021-11-09 DIAGNOSIS — M4326 Fusion of spine, lumbar region: Secondary | ICD-10-CM | POA: Diagnosis not present

## 2021-11-09 DIAGNOSIS — T82868A Thrombosis of vascular prosthetic devices, implants and grafts, initial encounter: Secondary | ICD-10-CM | POA: Diagnosis not present

## 2021-11-09 DIAGNOSIS — R918 Other nonspecific abnormal finding of lung field: Secondary | ICD-10-CM | POA: Diagnosis not present

## 2021-11-09 DIAGNOSIS — R5381 Other malaise: Secondary | ICD-10-CM | POA: Diagnosis present

## 2021-11-09 DIAGNOSIS — K56609 Unspecified intestinal obstruction, unspecified as to partial versus complete obstruction: Secondary | ICD-10-CM

## 2021-11-09 DIAGNOSIS — N5089 Other specified disorders of the male genital organs: Secondary | ICD-10-CM | POA: Diagnosis not present

## 2021-11-09 DIAGNOSIS — K5669 Other partial intestinal obstruction: Secondary | ICD-10-CM | POA: Diagnosis not present

## 2021-11-09 DIAGNOSIS — J439 Emphysema, unspecified: Secondary | ICD-10-CM | POA: Diagnosis not present

## 2021-11-09 DIAGNOSIS — R61 Generalized hyperhidrosis: Secondary | ICD-10-CM | POA: Diagnosis not present

## 2021-11-09 DIAGNOSIS — E162 Hypoglycemia, unspecified: Secondary | ICD-10-CM | POA: Diagnosis not present

## 2021-11-09 DIAGNOSIS — I82621 Acute embolism and thrombosis of deep veins of right upper extremity: Secondary | ICD-10-CM | POA: Diagnosis not present

## 2021-11-09 LAB — APTT: aPTT: 38 seconds — ABNORMAL HIGH (ref 24–36)

## 2021-11-09 LAB — URINALYSIS, ROUTINE W REFLEX MICROSCOPIC
Bilirubin Urine: NEGATIVE
Glucose, UA: NEGATIVE mg/dL
Ketones, ur: NEGATIVE mg/dL
Nitrite: NEGATIVE
Protein, ur: 100 mg/dL — AB
RBC / HPF: >50 RBC/hpf — ABNORMAL HIGH (ref 0–5)
Specific Gravity, Urine: 1.018 (ref 1.005–1.030)
WBC, UA: >50 WBC/hpf — ABNORMAL HIGH (ref 0–5)
pH: 6 (ref 5.0–8.0)

## 2021-11-09 LAB — I-STAT CHEM 8, ED
BUN: 49 mg/dL — ABNORMAL HIGH (ref 8–23)
Calcium, Ion: 0.98 mmol/L — ABNORMAL LOW (ref 1.15–1.40)
Chloride: 97 mmol/L — ABNORMAL LOW (ref 98–111)
Creatinine, Ser: 3 mg/dL — ABNORMAL HIGH (ref 0.61–1.24)
Glucose, Bld: 86 mg/dL (ref 70–99)
HCT: 28 % — ABNORMAL LOW (ref 39.0–52.0)
Hemoglobin: 9.5 g/dL — ABNORMAL LOW (ref 13.0–17.0)
Potassium: 5.6 mmol/L — ABNORMAL HIGH (ref 3.5–5.1)
Sodium: 129 mmol/L — ABNORMAL LOW (ref 135–145)
TCO2: 22 mmol/L (ref 22–32)

## 2021-11-09 LAB — PROCALCITONIN: Procalcitonin: 41.64 ng/mL

## 2021-11-09 LAB — CBG MONITORING, ED: Glucose-Capillary: 83 mg/dL (ref 70–99)

## 2021-11-09 LAB — CBC WITH DIFFERENTIAL/PLATELET
Abs Immature Granulocytes: 0.12 10*3/uL — ABNORMAL HIGH (ref 0.00–0.07)
Basophils Absolute: 0 10*3/uL (ref 0.0–0.1)
Basophils Relative: 1 %
Eosinophils Absolute: 0 10*3/uL (ref 0.0–0.5)
Eosinophils Relative: 0 %
HCT: 28.2 % — ABNORMAL LOW (ref 39.0–52.0)
Hemoglobin: 9.2 g/dL — ABNORMAL LOW (ref 13.0–17.0)
Immature Granulocytes: 2 %
Lymphocytes Relative: 5 %
Lymphs Abs: 0.3 10*3/uL — ABNORMAL LOW (ref 0.7–4.0)
MCH: 29.9 pg (ref 26.0–34.0)
MCHC: 32.6 g/dL (ref 30.0–36.0)
MCV: 91.6 fL (ref 80.0–100.0)
Monocytes Absolute: 0.3 10*3/uL (ref 0.1–1.0)
Monocytes Relative: 5 %
Neutro Abs: 6.2 10*3/uL (ref 1.7–7.7)
Neutrophils Relative %: 87 %
Platelets: 364 10*3/uL (ref 150–400)
RBC: 3.08 MIL/uL — ABNORMAL LOW (ref 4.22–5.81)
RDW: 14.3 % (ref 11.5–15.5)
WBC: 7 10*3/uL (ref 4.0–10.5)
nRBC: 0.3 % — ABNORMAL HIGH (ref 0.0–0.2)

## 2021-11-09 LAB — CORTISOL: Cortisol, Plasma: 36.6 ug/dL

## 2021-11-09 LAB — BASIC METABOLIC PANEL
Anion gap: 14 (ref 5–15)
BUN: 38 mg/dL — ABNORMAL HIGH (ref 8–23)
CO2: 16 mmol/L — ABNORMAL LOW (ref 22–32)
Calcium: 7.3 mg/dL — ABNORMAL LOW (ref 8.9–10.3)
Chloride: 101 mmol/L (ref 98–111)
Creatinine, Ser: 2.17 mg/dL — ABNORMAL HIGH (ref 0.61–1.24)
GFR, Estimated: 30 mL/min — ABNORMAL LOW (ref >60)
Glucose, Bld: 75 mg/dL (ref 70–99)
Potassium: 4.6 mmol/L (ref 3.5–5.1)
Sodium: 131 mmol/L — ABNORMAL LOW (ref 135–145)

## 2021-11-09 LAB — COMPREHENSIVE METABOLIC PANEL
ALT: 21 U/L (ref 0–44)
AST: 49 U/L — ABNORMAL HIGH (ref 15–41)
Albumin: 2.1 g/dL — ABNORMAL LOW (ref 3.5–5.0)
Alkaline Phosphatase: 57 U/L (ref 38–126)
Anion gap: 12 (ref 5–15)
BUN: 43 mg/dL — ABNORMAL HIGH (ref 8–23)
CO2: 21 mmol/L — ABNORMAL LOW (ref 22–32)
Calcium: 7.5 mg/dL — ABNORMAL LOW (ref 8.9–10.3)
Chloride: 98 mmol/L (ref 98–111)
Creatinine, Ser: 2.87 mg/dL — ABNORMAL HIGH (ref 0.61–1.24)
GFR, Estimated: 22 mL/min — ABNORMAL LOW (ref >60)
Glucose, Bld: 88 mg/dL (ref 70–99)
Potassium: 5.2 mmol/L — ABNORMAL HIGH (ref 3.5–5.1)
Sodium: 131 mmol/L — ABNORMAL LOW (ref 135–145)
Total Bilirubin: 1 mg/dL (ref 0.3–1.2)
Total Protein: 5.4 g/dL — ABNORMAL LOW (ref 6.5–8.1)

## 2021-11-09 LAB — RESP PANEL BY RT-PCR (FLU A&B, COVID) ARPGX2
Influenza A by PCR: NEGATIVE
Influenza B by PCR: NEGATIVE
SARS Coronavirus 2 by RT PCR: NEGATIVE

## 2021-11-09 LAB — PROTIME-INR
INR: 1.4 — ABNORMAL HIGH (ref 0.8–1.2)
Prothrombin Time: 17 seconds — ABNORMAL HIGH (ref 11.4–15.2)

## 2021-11-09 LAB — LACTIC ACID, PLASMA
Lactic Acid, Venous: 4.6 mmol/L (ref 0.5–1.9)
Lactic Acid, Venous: 4.5 mmol/L (ref 0.5–1.9)
Lactic Acid, Venous: 4.8 mmol/L (ref 0.5–1.9)

## 2021-11-09 MED ORDER — SIMETHICONE 40 MG/0.6ML PO SUSP
80.0000 mg | Freq: Four times a day (QID) | ORAL | Status: DC | PRN
  Administered 2021-11-15 – 2021-11-17 (×2): 80 mg via ORAL
  Filled 2021-11-09 (×4): qty 1.2

## 2021-11-09 MED ORDER — LACTATED RINGERS IV BOLUS (SEPSIS)
250.0000 mL | Freq: Once | INTRAVENOUS | Status: AC
  Administered 2021-11-09: 250 mL via INTRAVENOUS

## 2021-11-09 MED ORDER — LACTATED RINGERS IV BOLUS
1000.0000 mL | Freq: Once | INTRAVENOUS | Status: AC
  Administered 2021-11-09: 1000 mL via INTRAVENOUS

## 2021-11-09 MED ORDER — SODIUM CHLORIDE 0.9 % IV SOLN: 2.0000 g | INTRAVENOUS | Status: DC

## 2021-11-09 MED ORDER — DIPHENHYDRAMINE HCL 50 MG/ML IJ SOLN
12.5000 mg | Freq: Four times a day (QID) | INTRAMUSCULAR | Status: DC | PRN
  Administered 2021-11-17 – 2021-11-19 (×2): 12.5 mg via INTRAVENOUS
  Administered 2021-11-26: 25 mg via INTRAVENOUS
  Filled 2021-11-09 (×3): qty 1

## 2021-11-09 MED ORDER — LACTATED RINGERS IV BOLUS (SEPSIS)
1000.0000 mL | Freq: Once | INTRAVENOUS | Status: AC
  Administered 2021-11-09: 1000 mL via INTRAVENOUS

## 2021-11-09 MED ORDER — UMECLIDINIUM BROMIDE 62.5 MCG/ACT IN AEPB
1.0000 | INHALATION_SPRAY | Freq: Every day | RESPIRATORY_TRACT | Status: DC
  Filled 2021-11-09: qty 7

## 2021-11-09 MED ORDER — FLUTICASONE PROPIONATE 50 MCG/ACT NA SUSP
2.0000 | Freq: Every day | NASAL | Status: DC
Start: 2021-11-09 — End: 2021-11-30
  Administered 2021-11-09 – 2021-11-29 (×12): 2 via NASAL
  Filled 2021-11-09 (×2): qty 16

## 2021-11-09 MED ORDER — ALUM & MAG HYDROXIDE-SIMETH 200-200-20 MG/5ML PO SUSP: 30.0000 mL | Freq: Four times a day (QID) | ORAL | Status: DC | PRN

## 2021-11-09 MED ORDER — VANCOMYCIN VARIABLE DOSE PER UNSTABLE RENAL FUNCTION (PHARMACIST DOSING): Status: DC

## 2021-11-09 MED ORDER — SODIUM CHLORIDE 0.9 % IV SOLN
250.0000 mL | INTRAVENOUS | Status: DC
  Administered 2021-11-09 – 2021-11-14 (×5): 250 mL via INTRAVENOUS

## 2021-11-09 MED ORDER — VANCOMYCIN HCL 1500 MG/300ML IV SOLN
1500.0000 mg | Freq: Once | INTRAVENOUS | Status: AC
  Administered 2021-11-09: 1500 mg via INTRAVENOUS
  Filled 2021-11-09: qty 300

## 2021-11-09 MED ORDER — CHLORHEXIDINE GLUCONATE 0.12 % MT SOLN
15.0000 mL | Freq: Two times a day (BID) | OROMUCOSAL | Status: DC
  Administered 2021-11-09 – 2021-11-17 (×16): 15 mL via OROMUCOSAL
  Filled 2021-11-09 (×18): qty 15

## 2021-11-09 MED ORDER — MENTHOL 3 MG MT LOZG
1.0000 | LOZENGE | OROMUCOSAL | Status: DC | PRN
  Filled 2021-11-09: qty 9

## 2021-11-09 MED ORDER — LIP MEDEX EX OINT
1.0000 "application " | TOPICAL_OINTMENT | Freq: Two times a day (BID) | CUTANEOUS | Status: DC
  Administered 2021-11-09 – 2021-11-29 (×41): 1 via TOPICAL
  Filled 2021-11-09 (×8): qty 7

## 2021-11-09 MED ORDER — TIOTROPIUM BROMIDE MONOHYDRATE 18 MCG IN CAPS: 18.0000 ug | ORAL_CAPSULE | Freq: Every day | RESPIRATORY_TRACT | Status: DC

## 2021-11-09 MED ORDER — PHENOL 1.4 % MT LIQD
2.0000 | OROMUCOSAL | Status: DC | PRN
  Administered 2021-11-18: 2 via OROMUCOSAL
  Filled 2021-11-09 (×2): qty 177

## 2021-11-09 MED ORDER — SODIUM CHLORIDE 0.9 % IV SOLN
2.0000 g | Freq: Once | INTRAVENOUS | Status: AC
  Administered 2021-11-09: 2 g via INTRAVENOUS
  Filled 2021-11-09: qty 2

## 2021-11-09 MED ORDER — ORAL CARE MOUTH RINSE
15.0000 mL | Freq: Two times a day (BID) | OROMUCOSAL | Status: DC
  Administered 2021-11-09 – 2021-11-17 (×10): 15 mL via OROMUCOSAL

## 2021-11-09 MED ORDER — HEPARIN SODIUM (PORCINE) 5000 UNIT/ML IJ SOLN
5000.0000 [IU] | Freq: Three times a day (TID) | INTRAMUSCULAR | Status: DC
  Administered 2021-11-09 – 2021-11-23 (×43): 5000 [IU] via SUBCUTANEOUS
  Filled 2021-11-09 (×42): qty 1

## 2021-11-09 MED ORDER — CHLORHEXIDINE GLUCONATE CLOTH 2 % EX PADS
6.0000 | MEDICATED_PAD | Freq: Every day | CUTANEOUS | Status: DC
  Administered 2021-11-09 – 2021-11-13 (×4): 6 via TOPICAL

## 2021-11-09 MED ORDER — LACTATED RINGERS IV BOLUS: 1000.0000 mL | Freq: Three times a day (TID) | INTRAVENOUS | Status: AC | PRN

## 2021-11-09 MED ORDER — LACTATED RINGERS IV SOLN: INTRAVENOUS | Status: DC

## 2021-11-09 MED ORDER — NOREPINEPHRINE 16 MG/250ML-% IV SOLN
0.0000 ug/min | INTRAVENOUS | Status: DC
  Administered 2021-11-09: 1 ug/min via INTRAVENOUS
  Filled 2021-11-09 (×2): qty 250

## 2021-11-09 MED ORDER — ONDANSETRON HCL 4 MG/2ML IJ SOLN
4.0000 mg | Freq: Four times a day (QID) | INTRAMUSCULAR | Status: DC | PRN
  Administered 2021-11-13 – 2021-11-17 (×6): 4 mg via INTRAVENOUS
  Filled 2021-11-09 (×6): qty 2

## 2021-11-09 MED ORDER — VANCOMYCIN HCL IN DEXTROSE 1-5 GM/200ML-% IV SOLN: 1000.0000 mg | Freq: Once | INTRAVENOUS | Status: DC

## 2021-11-09 MED ORDER — METRONIDAZOLE 500 MG/100ML IV SOLN
500.0000 mg | Freq: Once | INTRAVENOUS | Status: AC
  Administered 2021-11-09: 500 mg via INTRAVENOUS
  Filled 2021-11-09: qty 100

## 2021-11-09 MED ORDER — MAGIC MOUTHWASH
15.0000 mL | Freq: Four times a day (QID) | ORAL | Status: DC | PRN
  Administered 2021-11-24: 15 mL via ORAL
  Filled 2021-11-09 (×2): qty 15

## 2021-11-09 MED ORDER — NOREPINEPHRINE 4 MG/250ML-% IV SOLN
2.0000 ug/min | INTRAVENOUS | Status: DC
  Administered 2021-11-09 (×2): 10 ug/min via INTRAVENOUS
  Administered 2021-11-09: 2 ug/min via INTRAVENOUS
  Filled 2021-11-09 (×2): qty 250

## 2021-11-09 MED ORDER — SODIUM CHLORIDE 0.9 % IV SOLN
8.0000 mg | Freq: Four times a day (QID) | INTRAVENOUS | Status: DC | PRN
  Filled 2021-11-09: qty 4

## 2021-11-09 MED ORDER — UMECLIDINIUM BROMIDE 62.5 MCG/ACT IN AEPB
1.0000 | INHALATION_SPRAY | Freq: Every day | RESPIRATORY_TRACT | Status: DC
  Administered 2021-11-10 – 2021-11-28 (×14): 1 via RESPIRATORY_TRACT
  Filled 2021-11-09 (×2): qty 7

## 2021-11-09 NOTE — Progress Notes (Signed)
Pharmacy Note   A consult was received from an ED physician for vancomycin and cefepime per pharmacy dosing.    The patient's profile has been reviewed for ht/wt/allergies/indication/available labs.    A one time order has been placed for vancomycin 1500 mg IV x1 and cefepime 2 gr IV x1 .    Further antibiotics/pharmacy consults should be ordered by admitting physician if indicated.                       Thank you,  Royetta Asal, PharmD, BCPS 11/07/2021 10:24 AM

## 2021-11-09 NOTE — ED Triage Notes (Signed)
Pt arrives from urology w/ c/o hypotension. Lowest pressures in the 50s. Pt had recent surgery. Alert at this time

## 2021-11-09 NOTE — H&P (Signed)
NAME:  William [name], MRN:  [id number], DOB:  20-Oct-1941, LOS: 0 ADMISSION DATE:  11/21/2021, CONSULTATION DATE:  11/10 REFERRING MD: Dr. Regenia Skeeter, CHIEF COMPLAINT: Low blood pressure    History of Present Illness:  80 y/o M who presented to [facility] ER on 11/10 after being seen in the Urology Clinic with finding of low blood pressure.    The patient underwent a robotic assisted laparoscopic radical cystectomy, robotic radical prostatectomy, bilateral pelvic lymph node dissection, extensive laparoscopic adhesiolysis, small bowel resection and ileal conduit urinary diversion on 1026 per [name] of Urology in the setting of invasive bladder cancer.  He was on the curative intent path with neoadjuvant chemotherapy (gem-cis) with curative intent resection.  His hospital course was largely uneventful - progressing to regular diet PO's by POD5, walking independently with walker and was able to change his ostomy pouch by discharge.  He was discharged on POD8 (11/3) with percocet for pain control.   The patient reports since discharge he has had been having abdominal discomfort.  Since 11/5, he had been dealing with constipation.  He and his wife noted he was eating and drinking less over the week and his urine was darker.  He developed increasing LLQ pain and intermittent ("up and down") fevers at home.  His temperature max at home was 100.2 on 11/6.  He denies chest pain, SOB, presyncopal episodes, N/V.  States his ileal conduit has been functioning normally.  His abdominal pain increased on 11/9 and pain medications were not working prompting his wife to call EMS.  He was not transported and was told to add naproxen to his pain control.  They started using this on 11/9 for help with pain.    He was seen in follow up at the Urology Clinic on 11/11 and was found to have blood pressure in the 79'B systolic.  The patient was referred to the ER for assessment.    Initial evaluation in the ER notable for hypotension  with SBP in the 60's, he was treated with IVF and then started on levophed for BP support. Labs - Na 131, K 5.2, Cl 98, CO2 21, glucose 88, BUN 43, Sr Cr 2.87, AG 12, lactic acid 4.6, WBC 7, Hgb 9.2, platelets 364 with mild left shift.  UA (from ileal conduit) with 100 protein, large leukocytes, >50 WBC and many bacteria.  CT of the chest, abdomen and pelvis were obtained which was notable for findings concerning for underlying ILD, coronary artery calcification, concern for small bowel obstruction / possible transition point in the lower abdomen/pelvis near a surgical anastomosis, small to moderate volume ascites in the pelvis / left abd, small amount of free intraperitoneal air (?post operative), small to moderate amount of subcutaneous emphysema without accompanying fluid collections / stranding, cystoprostatectomy changes with right sided urinary ostomy, bilateral ureteral stents extending through the ostomy, no hydronephrosis.    PCCM consulted for ICU evaluation.   Pertinent  Medical History  Muscle Invasive Bladder Cancer s/p Radical Cystectomy, Robotic Radical Prostatectomy  COVID - 08/2020  HLD  Reported Murmur - none heard on exam 11/15/2021 Wears Glasses  Chronic Low Back Pain on Gabapentin  Former Smoker - quit 1989 Prior abdominal surgery as a teenager thought related to appendicitis but appendix was normal.  Had bowel obstruction 6 months later requiring repeat surgery.   Significant Hospital Events: Including procedures, antibiotic start and stop dates in addition to other pertinent events   11/10 Admit with septic shock, concern for  abdominal source   Interim History / Subjective:  As above  On 50mcg norepinephrine   Objective   Blood pressure 90/62, pulse 90, temperature 97.9 F (36.6 C), temperature source Rectal, resp. rate 17, weight 79.4 kg, SpO2 98 %.        Intake/Output Summary (Last 24 hours) at 11/04/2021 1506 Last data filed at 11/10/2021 1439 Gross per 24 hour   Intake 1333.33 ml  Output 300 ml  Net 1033.33 ml   Filed Weights   11/18/2021 1104  Weight: 79.4 kg    Examination: General: ill appearing adult male lying on ER stretcher in NAD HENT: MM pink/dry(!), anicteric, wearing glasses, old skin graft site on left forehead Lungs: non-labored at rest, lungs bilaterally clear  Cardiovascular: s1s2 rrr, no m/r/g, occasional trigeminy on monitor Abdomen: midline surgical scar, healing well, small amt erythema at base of scar but no drainage or exudate. R ostomy with urine in bag, left LQ drain site erythematous, pain with palpation on left side of abdomen / worse in LLQ Extremities: warm/dry, 2+ pitting BLE edema  Neuro: AAOx4, speech difficult to understand as his mouth is so dry, able communicate appropriately, MAE    Resolved Hospital Problem list      Assessment & Plan:   Septic Shock, Suspected Abdominal Source Rule out abdominal source. Note urine drawn from ileostomy. Left shift on differential.  -admit to ICU  -additional LR bolus now  -wean levophed for MAP >65  -assess PCT trend  -repeat lactic acid now   -pending vasopressor needs, may need central venous access  -continue broad spectrum abx  -follow up CXR in am to ensure no developing infiltrate   Small Bowel Obstruction  With possible transition point on CT at anastomosis site  -bowel rest -NGT per CCS, hopeful for conservative management  -NPO -ensure good oral care   AKI  Hyponatremia  Suspect pre-renal in setting of poor intake.  Note NSAID use in last 24 hours.  -IVF as above  -assess FENa -Trend BMP / urinary output -Replace electrolytes as indicated -Avoid nephrotoxic agents, ensure adequate renal perfusion  Normocytic Anemia  -trend CBC  -transfuse for Hgb <7% or active bleeding   Trigeminy  Coronary Calcifications Noted on CT  -tele monitoring  -assess ECHO, most recent in 2018   Suspected ILD  Former Tobacco Abuse  Noted on CT imaging this  admission.  Former smoker, 7th decade of life.  -will need outpatient pulmonary follow up  -continue spiriva, flonase  Invasive Muscle Bladder Cancer s/p Radical Cystectomy, Prostatectomy & Ileal Conduit  10/29 robotic assisted laparoscopic radical cystectomy, robotic radical prostatectomy, bilateral pelvic lymph node dissection, extensive laparoscopic adhesiolysis, small bowel resection and ileal conduit urinary diversion on 1026 per [name] of Urology in the setting of invasive bladder cancer.  -ostomy care per protocol  Chronic Back Pain  -hold gabapentin, oxycodone, zanaflex  -may need PRN low dose IV control    Best Practice (right click and "Reselect all SmartList Selections" daily)  Diet/type: NPO DVT prophylaxis: prophylactic heparin  GI prophylaxis: N/A Lines: N/A Foley:  N/A Code Status:  full code Last date of multidisciplinary goals of care discussion: wife updated 11/10 on plan of care. Full code.   Labs   CBC: Recent Labs  Lab 11/14/2021 1016 11/18/2021 1029  WBC 7.0  --   NEUTROABS 6.2  --   HGB 9.2* 9.5*  HCT 28.2* 28.0*  MCV 91.6  --   PLT 364  --  Basic Metabolic Panel: Recent Labs  Lab 11/05/2021 1016 11/24/2021 1029  NA 131* 129*  K 5.2* 5.6*  CL 98 97*  CO2 21*  --   GLUCOSE 88 86  BUN 43* 49*  CREATININE 2.87* 3.00*  CALCIUM 7.5*  --    GFR: Estimated Creatinine Clearance: 20.2 mL/min (A) (by C-G formula based on SCr of 3 mg/dL (H)). Recent Labs  Lab 11/13/2021 1016 11/07/2021 1216  WBC 7.0  --   LATICACIDVEN 4.6* 4.5*    Liver Function Tests: Recent Labs  Lab 11/17/2021 1016  AST 49*  ALT 21  ALKPHOS 57  BILITOT 1.0  PROT 5.4*  ALBUMIN 2.1*   No results for input(s): LIPASE, AMYLASE in the last 168 hours. No results for input(s): AMMONIA in the last 168 hours.  ABG    Component Value Date/Time   HCO3 25.0 (H) 10/13/2013 1626   TCO2 22 11/17/2021 1029   ACIDBASEDEF 1.0 10/13/2013 1626   O2SAT 99.0 10/13/2013 1626      Coagulation Profile: Recent Labs  Lab 11/08/2021 1016  INR 1.4*    Cardiac Enzymes: No results for input(s): CKTOTAL, CKMB, CKMBINDEX, TROPONINI in the last 168 hours.  HbA1C: No results found for: HGBA1C  CBG: Recent Labs  Lab 11/27/2021 1019  GLUCAP 83    Review of Systems: Positives in [place]  Gen: Denies subjective fevers, chills, weight change, fatigue, night sweats HEENT: Denies blurred vision, double vision, hearing loss, tinnitus, sinus congestion, rhinorrhea, sore throat, neck stiffness, dysphagia PULM: Denies shortness of breath, cough, sputum production, hemoptysis, wheezing CV: Denies chest pain, edema, orthopnea, paroxysmal nocturnal dyspnea, palpitations GI: Denies abdominal pain, nausea, vomiting, diarrhea, hematochezia, melena, constipation, change in bowel habits GU: Denies dysuria, hematuria, polyuria, oliguria, urethral discharge Endocrine: Denies hot or cold intolerance, polyuria, polyphagia or appetite change Derm: Denies rash, dry skin, scaling or peeling skin change Heme: Denies easy bruising, bleeding, bleeding gums Neuro: Denies headache, numbness, weakness, slurred speech, loss of memory or consciousness  Past Medical History:  He,  has a past medical history of Bladder cancer ([place]), Bleeding gums (11/11/2015), COVID (09/23/2020), Dyslipidemia (high LDL; low HDL), Low back pain (12/27/2014), Medial epicondylitis of right elbow (12/13/2014), Numbness and tingling of right leg, Tinnitus, Toenail fungus, and Wears glasses.   Surgical History:   Past Surgical History:  Procedure Laterality Date   bone chip removed from right heel  1998   CYSTOSCOPY W/ RETROGRADES Bilateral 02/08/2021   Procedure: CYSTOSCOPY WITH RETROGRADE PYELOGRAM;  Surgeon: [name], MD;  Location[facility];  Service: Urology;  Laterality: Bilateral;   CYSTOSCOPY WITH INJECTION N/A 10/25/2021   Procedure: CYSTOSCOPY WITH INJECTION OF INDOCYANINE GREEN DYE;   Surgeon: [name], MD;  Location: [facility];  Service: Urology;  Laterality: N/A;   FINGER AMPUTATION     injured by table saw, left 4th and 5th fingers   IR IMAGING GUIDED PORT INSERTION  03/07/2021   IR REMOVAL TUN ACCESS W/ PORT W/O FL MOD SED  06/29/2021   KNEE ARTHROSCOPY W/ MENISCAL REPAIR  2012 2014   left   KNEE ARTHROSCOPY W/ MENISCAL REPAIR  11/2011   left   LACERATION REPAIR  1962   forehead   LAPAROSCOPIC LYSIS OF ADHESIONS N/A 10/25/2021   Procedure: EXTENSIVE LAPAROSCOPIC LYSIS OF ADHESIONS;  Surgeon: [name], MD;  Location: [facility];  Service: Urology;  Laterality: N/A;   LUMBAR DISC SURGERY  09/2007   bone spurs, stenosis, L3-4, 4-5    LYMPH NODE  DISSECTION Bilateral 10/25/2021   Procedure: LYMPH NODE DISSECTION;  Surgeon: [name], MD;  Location: [facility];  Service: Urology;  Laterality: Bilateral;   POSTERIOR LUMBAR FUSION 4 LEVEL N/A 10/13/2013   Procedure: Lumbar Two-Three, Lumbar Three-Four, Lumbar Four-Five, Lumbar Five-Sacral One posterior lumbar interbody fusion with interbody prosthesis posterior lateral arthrodesis and posterior segmental instrumentation;  Surgeon: [name], MD;  Location: [facility];  Service: Neurosurgery;  Laterality: N/A;  POSTERIOR LUMBAR FUSION 4 LEVEL   ROBOT ASSISTED LAPAROSCOPIC COMPLETE CYSTECT ILEAL CONDUIT N/A 10/25/2021   Procedure: XI ROBOTIC ASSISTED LAPAROSCOPIC COMPLETE CYSTECT ILEAL CONDUIT/RADICAL PROSTATECTOMY AND SMALL BOWEL RESECTION;  Surgeon: [name], MD;  Location: [facility];  Service: Urology;  Laterality: N/A;  6 HRS   SKIN GRAFT     from right thigh to left forehead   SMALL INTESTINE SURGERY  4008   ileitis complications   TRANSURETHRAL RESECTION OF BLADDER TUMOR N/A 02/08/2021   Procedure: TRANSURETHRAL RESECTION OF BLADDER TUMOR (TURBT);  Surgeon: [name], MD;  Location: [facility]  Service: Urology;  Laterality: N/A;     Social History:   reports that he quit  smoking about 35 years ago. His smoking use included cigarettes. He has a 45.00 pack-year smoking history. He has never used smokeless tobacco. He reports that he does not currently use alcohol. He reports that he does not use drugs.   Family History:  His family history includes Cancer in his maternal grandfather and maternal grandmother; Diabetes in his father; Drug abuse in his daughter; Heart disease in his father; Obesity in his brother and brother; Stroke in his father and mother.   Allergies Allergies  Allergen Reactions   Hydrocodone Other (See Comments)    Causes constipation even with stool softner     Home Medications  Prior to Admission medications   Medication Sig Start Date End Date Taking? Authorizing Provider  acetaminophen (TYLENOL) 500 MG tablet Take 1,000 mg by mouth every 4 (four) hours as needed for mild pain or fever.   Yes [provider]  fluticasone (FLONASE) 50 MCG/ACT nasal spray Place 2 sprays into both nostrils daily. 01/05/20  Yes [name], MD  gabapentin (NEURONTIN) 300 MG capsule TAKE 3 CAPSULES BY MOUTH AT BEDTIME Patient taking differently: Take 900 mg by mouth at bedtime. 03/09/21  Yes Patel, [name], DO  ibuprofen (ADVIL) 200 MG tablet Take 600 mg by mouth every 4 (four) hours as needed for mild pain.   Yes [provider]  ondansetron (ZOFRAN-ODT) 8 MG disintegrating tablet Take 8 mg by mouth every 8 (eight) hours as needed for nausea/vomiting. 11/06/21  Yes [provider]  oxyCODONE-acetaminophen (PERCOCET) 5-325 MG tablet Take 1 tablet by mouth every 6 (six) hours as needed for severe pain. 11/02/21 11/02/22 Yes [name], MD  SPIRIVA HANDIHALER 18 MCG inhalation capsule PLACE 1 CAPSULE( 18 MCG TOTAL) INTO INHALER AND INHALE DAILY Patient taking differently: Place 18 mcg into inhaler and inhale daily. 08/28/21  Yes [name], MD  tiZANidine (ZANAFLEX) 4 MG tablet Take 4 mg by mouth at bedtime.   Yes  [provider]     Critical care time: 34 minutes    Noe Gens, MSN, APRN, NP-C, AGACNP-BC [facility] 11/23/2021, 3:06 PM   Please see [email or web address] for pager details.   From 7A-7P if no response, please call [phone] After hours, please call ELink [phone]

## 2021-11-09 NOTE — Consult Note (Signed)
[facility] Surgery Consult Note  William [name] 09-03-1941  [id number].    Requesting MD: Sherwood Gambler Chief Complaint/Reason for Consult: shock  HPI:  William [name] is a 80yo male PMH Muscle invasive bladder cancer s/p completion of neoadjuvant chemotherapy 02/2021 who underwent Robotic-assisted laparoscopic radical cystectomy, radical prostatectomy, Bilateral pelvic lymph node dissection, Extensive laparoscopic adhesiolysis, Small bowel resection, Ileal conduit urinary diversion on 10/26/21 by [name]. He is 14 days out from his procedure. Patient was sent to the ED via EMS from the urology office with hypotension.   Patient states that he has been having some abdominal discomfort since surgery. Since Saturday he has been dealing with constipation despite miralax. He reports he is still passing flatus, including today, but had no BM since Saturday until this am when he had a very small soft BM. He reports small PO intake each day, decreased from his usual (had bread and few sips of coffee for breakfast). Yesterday his abdominal discomfort seemed to become more severe and localize to the LLQ. His wife reports that she called EMS yesterday after low blood pressure reading at home but after EMS came out his BP was normal and they did not present to the ED. He had a temp of 100.2 on Sunday but no elevated temperatures since that time. He reports chills, fatigue, dizziness, and lower extremity swelling. No nausea, vomiting, cp, sob or cough. His ileal conduit has been functioning.   In the ED patient was found to be hypotensive at 68/50, otherwise VSS (no tachycardia) and afebrile. Labwork significant for WBC 7, lactic acid 4.6>>4.5, Na 131, K 5.2, Cr 2.87. U/a with large hgb, protein, leukocytes, >50 RBC, >50 WBC, many bacteria. Urine culture pending. Blood cultures are pending. Chest xray with interstitial lung disease otherwise no acute finding. CT chest/ abdomen/ pelvis concerning for SBO  with possible transition point in the lower abdomen/pelvis near anastomosis, small/ moderate volume ascites, small amount of free intraperitoneal air, small/ moderate amount of subcutaneous emphysema mostly in the anterior abdominal and pelvic walls with no defined fluid collections or significant adjacent inflammatory fat stranding.  General surgery asked to see.  Anticoagulants: none Former smoker, quit in 1989 Denies alcohol or illict drug use  Review of Systems  Constitutional:  Positive for malaise/fatigue. Negative for chills and fever.  Respiratory:  Negative for cough and shortness of breath.   Cardiovascular:  Positive for leg swelling. Negative for chest pain.  Gastrointestinal:  Positive for abdominal pain and constipation. Negative for nausea and vomiting.  Genitourinary: Negative.   All other systems reviewed and are negative. All systems reviewed and otherwise negative except for as above  Family History  Problem Relation Age of Onset   Stroke Mother    Heart disease Father    Stroke Father    Diabetes Father    Obesity Brother    Obesity Brother    Cancer Maternal Grandfather    Cancer Maternal Grandmother    Drug abuse Daughter     Past Medical History:  Diagnosis Date   Bladder cancer ([place])    Pt says small tumor not cancer   Bleeding gums 11/11/2015   COVID 09/23/2020   back ache hurt all over x few days all symptoms reolved   Dyslipidemia (high LDL; low HDL)    Low back pain 12/27/2014   Medial epicondylitis of right elbow 12/13/2014   Numbness and tingling of right leg    Tinnitus    all the time  Toenail fungus    2 big toes   Wears glasses     Past Surgical History:  Procedure Laterality Date   bone chip removed from right heel  1998   CYSTOSCOPY W/ RETROGRADES Bilateral 02/08/2021   Procedure: CYSTOSCOPY WITH RETROGRADE PYELOGRAM;  Surgeon: [name], MD;  Location: [facility];  Service: Urology;  Laterality: Bilateral;    CYSTOSCOPY WITH INJECTION N/A 10/25/2021   Procedure: CYSTOSCOPY WITH INJECTION OF INDOCYANINE GREEN DYE;  Surgeon: [name], MD;  Location: [facility];  Service: Urology;  Laterality: N/A;   FINGER AMPUTATION     injured by table saw, left 4th and 5th fingers   IR IMAGING GUIDED PORT INSERTION  03/07/2021   IR REMOVAL TUN ACCESS W/ PORT W/O FL MOD SED  06/29/2021   KNEE ARTHROSCOPY W/ MENISCAL REPAIR  2012 2014   left   KNEE ARTHROSCOPY W/ MENISCAL REPAIR  11/2011   left   LACERATION REPAIR  1962   forehead   LAPAROSCOPIC LYSIS OF ADHESIONS N/A 10/25/2021   Procedure: EXTENSIVE LAPAROSCOPIC LYSIS OF ADHESIONS;  Surgeon: [name], MD;  Location: [facility];  Service: Urology;  Laterality: N/A;   LUMBAR DISC SURGERY  09/2007   bone spurs, stenosis, L3-4, 4-5    LYMPH NODE DISSECTION Bilateral 10/25/2021   Procedure: LYMPH NODE DISSECTION;  Surgeon: [name], MD;  Location: [facility];  Service: Urology;  Laterality: Bilateral;   POSTERIOR LUMBAR FUSION 4 LEVEL N/A 10/13/2013   Procedure: Lumbar Two-Three, Lumbar Three-Four, Lumbar Four-Five, Lumbar Five-Sacral One posterior lumbar interbody fusion with interbody prosthesis posterior lateral arthrodesis and posterior segmental instrumentation;  Surgeon: [name], MD;  Location: [facility] [facility];  Service: Neurosurgery;  Laterality: N/A;  POSTERIOR LUMBAR FUSION 4 LEVEL   ROBOT ASSISTED LAPAROSCOPIC COMPLETE CYSTECT ILEAL CONDUIT N/A 10/25/2021   Procedure: XI ROBOTIC ASSISTED LAPAROSCOPIC COMPLETE CYSTECT ILEAL CONDUIT/RADICAL PROSTATECTOMY AND SMALL BOWEL RESECTION;  Surgeon: [name], MD;  Location: [facility];  Service: Urology;  Laterality: N/A;  6 HRS   SKIN GRAFT     from right thigh to left forehead   SMALL INTESTINE SURGERY  1610   ileitis complications   TRANSURETHRAL RESECTION OF BLADDER TUMOR N/A 02/08/2021   Procedure: TRANSURETHRAL RESECTION OF BLADDER TUMOR (TURBT);  Surgeon: [name], MD;  Location: [facility];  Service: Urology;  Laterality: N/A;    Social History:  reports that he quit smoking about 35 years ago. His smoking use included cigarettes. He has a 45.00 pack-year smoking history. He has never used smokeless tobacco. He reports that he does not currently use alcohol. He reports that he does not use drugs.  Allergies:  Allergies  Allergen Reactions   Hydrocodone Other (See Comments)    Causes constipation even with stool softner    (Not in a hospital admission)   Prior to Admission medications   Medication Sig Start Date End Date Taking? Authorizing Provider  acetaminophen (TYLENOL) 500 MG tablet Take 1,000 mg by mouth every 4 (four) hours as needed for mild pain or fever.   Yes [provider]  fluticasone (FLONASE) 50 MCG/ACT nasal spray Place 2 sprays into both nostrils daily. 01/05/20  Yes [name], MD  gabapentin (NEURONTIN) 300 MG capsule TAKE 3 CAPSULES BY MOUTH AT BEDTIME Patient taking differently: Take 900 mg by mouth at bedtime. 03/09/21  Yes Patel, [name], DO  ibuprofen (ADVIL) 200 MG tablet Take 600 mg by mouth every 4 (four) hours  as needed for mild pain.   Yes [provider]  ondansetron (ZOFRAN-ODT) 8 MG disintegrating tablet Take 8 mg by mouth every 8 (eight) hours as needed for nausea/vomiting. 11/06/21  Yes [provider]  oxyCODONE-acetaminophen (PERCOCET) 5-325 MG tablet Take 1 tablet by mouth every 6 (six) hours as needed for severe pain. 11/02/21 11/02/22 Yes [name], MD  SPIRIVA HANDIHALER 18 MCG inhalation capsule PLACE 1 CAPSULE( 18 MCG TOTAL) INTO INHALER AND INHALE DAILY Patient taking differently: Place 18 mcg into inhaler and inhale daily. 08/28/21  Yes [name], MD  tiZANidine (ZANAFLEX) 4 MG tablet Take 4 mg by mouth at bedtime.   Yes [provider]    Blood pressure (!) 85/52, pulse 80, temperature 97.9 F (36.6 C), temperature source Rectal, resp. rate 10, weight  79.4 kg, SpO2 97 %. Physical Exam: General: pleasant, WD/WN elderly male who is laying in bed in NAD HEENT: head is normocephalic, atraumatic.  Sclera are noninjected.  PERRL.  Ears and nose without any masses or lesions.  Mouth is pink and moist. Dentition fair Heart: regular, rate, and rhythm.  Normal s1,s2. No obvious murmurs, gallops, or rubs noted.  Palpable radial pulses bilaterally  Lungs: CTAB, no wheezes, rhonchi, or rales noted.  Respiratory effort nonlabored Abd: Soft but distended. Hypoactive bowel sounds. Midline wound healing well. Prior drain site healing well without surrounding signs of cellulitis. No crepitus of abdominal wall. Ileal Conduit with urine in bag. Stoma that is able to be visualized appears viable. Patient has diffuse tenderness along the left side of his abdomen with greatest tenderness in the LLQ with inconsistent/voluntary guarding (he guarded on initial palpation but on repeat exam, did not guard). No masses, hernias, or organomegaly MS: no BUE edema. 2+ B/l LE edema. Calves soft and nontender Skin: warm and dry with no masses, lesions, or rashes Psych: A&Ox4 with an appropriate affect Neuro: cranial nerves grossly intact, moves all extremities, normal speech, thought process intact, gait not assessed   Results for orders placed or performed during the hospital encounter of 11/08/2021 (from the past 48 hour(s))  Lactic acid, plasma     Status: Abnormal   Collection Time: 11/25/2021 10:16 AM  Result Value Ref Range   Lactic Acid, Venous 4.6 (HH) 0.5 - 1.9 mmol/L    Comment: CRITICAL RESULT CALLED TO, READ BACK BY AND VERIFIED WITH: WOODY,A. RN AT 1106 11/07/2021 MULLINS,T Performed at [facility], [place] [address], [place], [place] [address]   Comprehensive metabolic panel     Status: Abnormal   Collection Time: 11/08/2021 10:16 AM  Result Value Ref Range   Sodium 131 (L) 135 - 145 mmol/L   Potassium 5.2 (H) 3.5 - 5.1 mmol/L   Chloride 98 98 - 111  mmol/L   CO2 21 (L) 22 - 32 mmol/L   Glucose, Bld 88 70 - 99 mg/dL    Comment: Glucose reference range applies only to samples taken after fasting for at least 8 hours.   BUN 43 (H) 8 - 23 mg/dL   Creatinine, Ser 2.87 (H) 0.61 - 1.24 mg/dL   Calcium 7.5 (L) 8.9 - 10.3 mg/dL   Total Protein 5.4 (L) 6.5 - 8.1 g/dL   Albumin 2.1 (L) 3.5 - 5.0 g/dL   AST 49 (H) 15 - 41 U/L   ALT 21 0 - 44 U/L   Alkaline Phosphatase 57 38 - 126 U/L   Total Bilirubin 1.0 0.3 - 1.2 mg/dL   GFR, Estimated  22 (L) >60 mL/min    Comment: (NOTE) Calculated using the CKD-EPI Creatinine Equation (2021)    Anion gap 12 5 - 15    Comment: Performed at [facility], [place] [address] [place], [place] [address]  CBC WITH DIFFERENTIAL     Status: Abnormal   Collection Time: 11/23/2021 10:16 AM  Result Value Ref Range   WBC 7.0 4.0 - 10.5 K/uL   RBC 3.08 (L) 4.22 - 5.81 MIL/uL   Hemoglobin 9.2 (L) 13.0 - 17.0 g/dL   HCT 28.2 (L) 39.0 - 52.0 %   MCV 91.6 80.0 - 100.0 fL   MCH 29.9 26.0 - 34.0 pg   MCHC 32.6 30.0 - 36.0 g/dL   RDW 14.3 11.5 - 15.5 %   Platelets 364 150 - 400 K/uL   nRBC 0.3 (H) 0.0 - 0.2 %   Neutrophils Relative % 87 %   Neutro Abs 6.2 1.7 - 7.7 K/uL   Lymphocytes Relative 5 %   Lymphs Abs 0.3 (L) 0.7 - 4.0 K/uL   Monocytes Relative 5 %   Monocytes Absolute 0.3 0.1 - 1.0 K/uL   Eosinophils Relative 0 %   Eosinophils Absolute 0.0 0.0 - 0.5 K/uL   Basophils Relative 1 %   Basophils Absolute 0.0 0.0 - 0.1 K/uL   WBC Morphology MILD LEFT SHIFT (1-5% METAS, OCC MYELO, OCC BANDS)     Comment: TOXIC GRANULATION   Immature Granulocytes 2 %   Abs Immature Granulocytes 0.12 (H) 0.00 - 0.07 K/uL    Comment: Performed at [facility], [place] [address], [place], [place] [address]  Protime-INR     Status: Abnormal   Collection Time: 11/25/2021 10:16 AM  Result Value Ref Range   Prothrombin Time 17.0 (H) 11.4 - 15.2 seconds   INR 1.4 (H) 0.8 - 1.2    Comment: (NOTE) INR  goal varies based on device and disease states. Performed at [facility], [place] [address] [place], [place] [address]   APTT     Status: Abnormal   Collection Time: 11/19/2021 10:16 AM  Result Value Ref Range   aPTT 38 (H) 24 - 36 seconds    Comment:        IF BASELINE aPTT IS ELEVATED, SUGGEST PATIENT RISK ASSESSMENT BE USED TO DETERMINE APPROPRIATE ANTICOAGULANT THERAPY. Performed at [facility], [place] [address], [place], [place] [address]   Blood Culture (routine x 2)     Status: None (Preliminary result)   Collection Time: 11/23/2021 10:16 AM   Specimen: BLOOD RIGHT ARM  Result Value Ref Range   Specimen Description BLOOD RIGHT ARM    Special Requests      BOTTLES DRAWN AEROBIC AND ANAEROBIC Blood Culture adequate volume Performed at [facility], [place] [address] [place],  [address]    Culture PENDING    Report Status PENDING   Urinalysis, Routine w reflex microscopic     Status: Abnormal   Collection Time: 11/15/2021 10:16 AM  Result Value Ref Range   Color, Urine AMBER (A) YELLOW    Comment: BIOCHEMICALS MAY BE AFFECTED BY COLOR   APPearance CLOUDY (A) CLEAR   Specific Gravity, Urine 1.018 1.005 - 1.030   pH 6.0 5.0 - 8.0   Glucose, UA NEGATIVE NEGATIVE mg/dL   Hgb urine dipstick LARGE (A) NEGATIVE   Bilirubin Urine NEGATIVE NEGATIVE   Ketones, ur NEGATIVE NEGATIVE mg/dL   Protein, ur 100 (A) NEGATIVE mg/dL  Nitrite NEGATIVE NEGATIVE   Leukocytes,Ua LARGE (A) NEGATIVE   RBC / HPF >50 (H) 0 - 5 RBC/hpf   WBC, UA >50 (H) 0 - 5 WBC/hpf   Bacteria, UA MANY (A) NONE SEEN   Squamous Epithelial / LPF 0-5 0 - 5   WBC Clumps PRESENT    Mucus PRESENT    Budding Yeast PRESENT     Comment: Performed at [facility], [place] [address] [place], [place] [address]  CBG monitoring, ED     Status: None   Collection Time: 11/04/2021 10:19 AM  Result Value Ref Range   Glucose-Capillary 83 70 - 99 mg/dL    Comment:  Glucose reference range applies only to samples taken after fasting for at least 8 hours.  Blood Culture (routine x 2)     Status: None (Preliminary result)   Collection Time: 11/07/2021 10:21 AM   Specimen: BLOOD RIGHT WRIST  Result Value Ref Range   Specimen Description      BLOOD RIGHT WRIST Performed at [facility], [address] [place], [place] [address]    Special Requests      BOTTLES DRAWN AEROBIC ONLY Blood Culture adequate volume Performed at [place] [address] [place], [place] [address]    Culture PENDING    Report Status PENDING   I-Stat Chem 8, ED     Status: Abnormal   Collection Time: 11/24/2021 10:29 AM  Result Value Ref Range   Sodium 129 (L) 135 - 145 mmol/L   Potassium 5.6 (H) 3.5 - 5.1 mmol/L   Chloride 97 (L) 98 - 111 mmol/L   BUN 49 (H) 8 - 23 mg/dL   Creatinine, Ser 3.00 (H) 0.61 - 1.24 mg/dL   Glucose, Bld 86 70 - 99 mg/dL    Comment: Glucose reference range applies only to samples taken after fasting for at least 8 hours.   Calcium, Ion 0.98 (L) 1.15 - 1.40 mmol/L   TCO2 22 22 - 32 mmol/L   Hemoglobin 9.5 (L) 13.0 - 17.0 g/dL   HCT 28.0 (L) 39.0 - 52.0 %  Resp Panel by RT-PCR (Flu A&B, Covid) Nasopharyngeal Swab     Status: None   Collection Time: 11/05/2021 11:18 AM   Specimen: Nasopharyngeal Swab; Nasopharyngeal(NP) swabs in vial transport medium  Result Value Ref Range   SARS Coronavirus 2 by RT PCR NEGATIVE NEGATIVE    Comment: (NOTE) SARS-CoV-2 target nucleic acids are NOT DETECTED.  The SARS-CoV-2 RNA is generally detectable in upper respiratory specimens during the acute phase of infection. The lowest concentration of SARS-CoV-2 viral copies this assay can detect is 138 copies/mL. A negative result does not preclude SARS-Cov-2 infection and should not be used as the sole basis for treatment or other patient management decisions. A negative result may occur with  improper specimen collection/handling,  submission of specimen other than nasopharyngeal swab, presence of viral mutation(s) within the areas targeted by this assay, and inadequate number of viral copies(<138 copies/mL). A negative result must be combined with clinical observations, patient history, and epidemiological information. The expected result is Negative.  Fact Sheet for Patients:  EntrepreneurPulse.com.au  Fact Sheet for Healthcare Providers:  IncredibleEmployment.be  This test is no t yet approved or cleared by the [place] FDA and  has been authorized for detection and/or diagnosis of SARS-CoV-2 by FDA under an Emergency Use Authorization (EUA). This EUA will remain  in effect (meaning this test can be used) for the  duration of the COVID-19 declaration under Section 564(b)(1) of the Act, 21 U.S.C.section 360bbb-3(b)(1), unless the authorization is terminated  or revoked sooner.       Influenza A by PCR NEGATIVE NEGATIVE   Influenza B by PCR NEGATIVE NEGATIVE    Comment: (NOTE) The Xpert Xpress SARS-CoV-2/FLU/RSV plus assay is intended as an aid in the diagnosis of influenza from Nasopharyngeal swab specimens and should not be used as a sole basis for treatment. Nasal washings and aspirates are unacceptable for Xpert Xpress SARS-CoV-2/FLU/RSV testing.  Fact Sheet for Patients: EntrepreneurPulse.com.au  Fact Sheet for Healthcare Providers: IncredibleEmployment.be  This test is not yet approved or cleared by the [place] FDA and has been authorized for detection and/or diagnosis of SARS-CoV-2 by FDA under an Emergency Use Authorization (EUA). This EUA will remain in effect (meaning this test can be used) for the duration of the COVID-19 declaration under Section 564(b)(1) of the Act, 21 U.S.C. section 360bbb-3(b)(1), unless the authorization is terminated or revoked.  Performed at [facility], [place]  [address], [place], [place] [address]   Lactic acid, plasma     Status: Abnormal   Collection Time: 11/13/2021 12:16 PM  Result Value Ref Range   Lactic Acid, Venous 4.5 (HH) 0.5 - 1.9 mmol/L    Comment: CRITICAL VALUE NOTED.  VALUE IS CONSISTENT WITH PREVIOUSLY REPORTED AND CALLED VALUE. Performed at [facility], [place] [address], [place], [place] [address]    DG Chest Port 1 View  Result Date: 11/27/2021 CLINICAL DATA:  Questionable sepsis EXAM: PORTABLE CHEST 1 VIEW COMPARISON:  10/24/2021 FINDINGS: Stable interstitial coarsening with interstitial lung disease by June 2022 CT. There is no edema, consolidation, effusion, or pneumothorax. Normal heart size and mediastinal contours. IMPRESSION: Interstitial lung disease.  No acute superimposed finding. Electronically Signed   By: Jorje Guild M.D.   On: 11/20/2021 10:44   CT CHEST ABDOMEN PELVIS WO CONTRAST  Result Date: 11/14/2021 CLINICAL DATA:  Hypotension, shock EXAM: CT CHEST, ABDOMEN AND PELVIS WITHOUT CONTRAST TECHNIQUE: Multidetector CT imaging of the chest, abdomen and pelvis was performed following the standard protocol without IV contrast. COMPARISON:  CT abdomen and pelvis 06/09/2021 FINDINGS: CT CHEST FINDINGS Cardiovascular: Heart size is normal. No pericardial effusion identified. Coronary artery calcifications noted. Main pulmonary artery is normal caliber. Note is made of a small amount of air in the brachiocephalic vein. Thoracic aorta is normal in caliber with mild calcified plaques. Mediastinum/Nodes: No bulky axillary, mediastinal or hilar lymphadenopathy identified. Lungs/Pleura: Lungs are hyperinflated with mild emphysematous changes. There is mild interlobular septal thickening which is new since previous study. Dependent subsegmental atelectatic changes bilaterally. No focal consolidation visualized. No significant pleural effusion. No pneumothorax. Musculoskeletal: No suspicious bony lesions identified in  the chest. Small foci of subcutaneous or vascular air identified in the right upper chest region as well as in the subcutaneous plane in the lower chest extending to the abdomen. CT ABDOMEN PELVIS FINDINGS Hepatobiliary: No focal liver abnormality is seen. No gallstones, gallbladder wall thickening, or biliary dilatation. Pancreas: Unremarkable. No pancreatic ductal dilatation or surrounding inflammatory changes. Spleen: Normal in size without focal abnormality. Adrenals/Urinary Tract: Adrenal glands appear normal. Recent cystoprostatectomy surgical changes. No nephrolithiasis or hydronephrosis identified bilaterally. Bilateral ureteral stents which terminate distally through an ostomy in the right abdomen. Stomach/Bowel: Limited evaluation of bowel due to lack of contrast. There are multiple loops of distended small bowel with air-fluid levels, mostly in the left abdomen which measure up to 4.9  cm in diameter. Accompanying mesenteric engorgement/fat stranding. No pneumatosis identified. Distal loops of ileum are decompressed. Postsurgical changes in the right lower quadrant. Multiple foci of extraluminal air visualized in the right pelvis and a few in the upper abdomen. Retained fecal material in the colon and rectum. Vascular/Lymphatic: Aortic atherosclerosis. No enlarged abdominal or pelvic lymph nodes. Reproductive: Prostate gland surgically absent. Other: Subcutaneous emphysema identified throughout the anterior abdomen, pelvis and anterior aspect of the visualized proximal lower extremities. No associated defined fluid collections. Small to moderate volume ascites mostly in the pelvis and in the left abdomen Musculoskeletal: Degenerative and postsurgical changes of the lumbar spine. No suspicious bony lesions visualized. IMPRESSION: 1. Findings concerning for small bowel obstruction, with proximal loops of small bowel measuring up to 4.9 cm in diameter with air-fluid levels. Possible transition point in the  lower abdomen/pelvis near a surgical anastomosis. 2. Small to moderate volume ascites mostly located in the pelvis and left abdomen. 3. Small amount of free intraperitoneal air, mostly in the right pelvis surrounded by ascites. May be postoperative in nature, correlate clinically and follow-up recommended. 4. Small to moderate amount of subcutaneous emphysema mostly in the anterior abdominal and pelvic walls. No accompanying defined fluid collections or significant adjacent inflammatory fat stranding. 5. Cystoprostatectomy changes with right-sided urinary ostomy. Bilateral ureteral stents extending through the ostomy. No hydronephrosis. 6. Mild interstitial pulmonary edema. Emphysematous and subsegmental atelectatic changes. Discussed with Dr. Regenia Skeeter over the telephone at 1:09 p.m. on 11/24/2021 with read back. Electronically Signed   By: Ofilia Neas M.D.   On: 11/18/2021 13:26    Anti-infectives (From admission, onward)    Start     Dose/Rate Route Frequency Ordered Stop   11/10/2021 1030  ceFEPIme (MAXIPIME) 2 g in sodium chloride 0.9 % 100 mL IVPB        2 g 200 mL/hr over 30 Minutes Intravenous  Once 11/08/2021 1016 11/10/2021 1133   11/08/2021 1030  metroNIDAZOLE (FLAGYL) IVPB 500 mg        500 mg 100 mL/hr over 60 Minutes Intravenous  Once 11/10/2021 1016 11/23/2021 1203   11/27/2021 1030  vancomycin (VANCOCIN) IVPB 1000 mg/200 mL premix  Status:  Discontinued        1,000 mg 200 mL/hr over 60 Minutes Intravenous  Once 11/08/2021 1016 11/03/2021 1023   11/01/2021 1030  vancomycin (VANCOREADY) IVPB 1500 mg/300 mL        1,500 mg 150 mL/hr over 120 Minutes Intravenous  Once 11/06/2021 1023 11/05/2021 1318        Assessment/Plan Shock - on levo SBO Small volume pneumoperitoneum  Hx Muscle invasive bladder cancer S/P Robotic-assisted laparoscopic radical cystectomy, radical prostatectomy, Bilateral pelvic lymph node dissection, Extensive laparoscopic adhesiolysis, Small bowel resection, Ileal conduit  urinary diversion 10/26/21 [name] - Reviewed CT scan, history and exam with my attending. Dr. Johney Maine has discussed with Urology. At this time would not recommend emergency surgery. Plan for NGT decompression and continued IVF resuscitation. Appreciate CCM's assistance with resuscitation. He is on broad spectrum abx. Will defer to Urology on drainage of the pelvic fluid collection. We will follow along closely.   Subcutaneous emphysema anterior abdominal wall without abscess - discussed with Urology who reviewed the scan and felt this was normal post operative findings from insufflation  ID - maxipime/ flagyl/ vancomycin (will discuss abx selection with MD) VTE - SCDs, per primary FEN - NPO, NGT, IVF Foley - none. Urostomy/Ileal conduit   Asthma/ COPD Chronic back pain HLD AKI Possible  UTI - suspect dirty catch. Will defer to Urology  Alferd Apa, [facility] 11/02/2021, 2:07 PM Please see Amion for pager number during day hours 7:00am-4:30pm

## 2021-11-09 NOTE — Consult Note (Signed)
Reason for Consult:Bladder Cancer, Urinary Diversion, Sepsis  Referring Physician: Ronne Binning MD  William [name] is an 80 y.o. male.   HPI:   1 - Bladder Cancer - s/p cystectomy with lysis of adhesions and small bowel resection 10/25/2021 for stage 2 bladder cancer with negative nodes and margins, Had neoadjuvant chemo prior.   2 - Urinary Diversion - s/p conduit diversion at cystectomy 09/2021. Stents in good position by ER CT this admission.   3 - Sepsis, Small Bowel Obstruction - tachycardia, chills, lactic acidosis c/w major sepsis at ER eval 11/10. CT with expected subcutaneous gas (from insuflattion at procedure), some pelvic fluid. NO significant inraperitoneal air, stents in good position. Some dilated small bowel. Has been tolerating PO as of today and with persistent bowel function.   Today "William [name]" is seen as urgent consult for above. He presented to our office earlier today with severe sepsis picture and was immediately transferred for ER eval.   Past Medical History:  Diagnosis Date   Bladder cancer ([place])    Pt says small tumor not cancer   Bleeding gums 11/11/2015   COVID 09/23/2020   back ache hurt all over x few days all symptoms reolved   Dyslipidemia (high LDL; low HDL)    Low back pain 12/27/2014   Medial epicondylitis of right elbow 12/13/2014   Numbness and tingling of right leg    Tinnitus    all the time   Toenail fungus    2 big toes   Wears glasses     Past Surgical History:  Procedure Laterality Date   bone chip removed from right heel  [place] Bilateral 02/08/2021   Procedure: CYSTOSCOPY WITH RETROGRADE PYELOGRAM;  Surgeon: [name], MD;  Location: [facility];  Service: Urology;  Laterality: Bilateral;   CYSTOSCOPY WITH INJECTION N/A 10/25/2021   Procedure: CYSTOSCOPY WITH INJECTION OF INDOCYANINE GREEN DYE;  Surgeon: [name], MD;  Location: [facility];  Service: Urology;  Laterality: N/A;   FINGER  AMPUTATION     injured by table saw, left 4th and 5th fingers   IR IMAGING GUIDED PORT INSERTION  03/07/2021   IR REMOVAL TUN ACCESS W/ PORT W/O FL MOD SED  06/29/2021   KNEE ARTHROSCOPY W/ MENISCAL REPAIR  2012 2014   left   KNEE ARTHROSCOPY W/ MENISCAL REPAIR  11/2011   left   LACERATION REPAIR  1962   forehead   LAPAROSCOPIC LYSIS OF ADHESIONS N/A 10/25/2021   Procedure: EXTENSIVE LAPAROSCOPIC LYSIS OF ADHESIONS;  Surgeon: [name], MD;  Location: [facility];  Service: Urology;  Laterality: N/A;   LUMBAR DISC SURGERY  09/2007   bone spurs, stenosis, L3-4, 4-5    LYMPH NODE DISSECTION Bilateral 10/25/2021   Procedure: LYMPH NODE DISSECTION;  Surgeon: [name], MD;  Location: [facility];  Service: Urology;  Laterality: Bilateral;   POSTERIOR LUMBAR FUSION 4 LEVEL N/A 10/13/2013   Procedure: Lumbar Two-Three, Lumbar Three-Four, Lumbar Four-Five, Lumbar Five-Sacral One posterior lumbar interbody fusion with interbody prosthesis posterior lateral arthrodesis and posterior segmental instrumentation;  Surgeon: [name], MD;  Location: [facility];  Service: Neurosurgery;  Laterality: N/A;  POSTERIOR LUMBAR FUSION 4 LEVEL   ROBOT ASSISTED LAPAROSCOPIC COMPLETE CYSTECT ILEAL CONDUIT N/A 10/25/2021   Procedure: XI ROBOTIC ASSISTED LAPAROSCOPIC COMPLETE CYSTECT ILEAL CONDUIT/RADICAL PROSTATECTOMY AND SMALL BOWEL RESECTION;  Surgeon: [name], MD;  Location: [facility];  Service: Urology;  Laterality: N/A;  6 HRS  SKIN GRAFT     from right thigh to left forehead   SMALL INTESTINE SURGERY  8588   ileitis complications   TRANSURETHRAL RESECTION OF BLADDER TUMOR N/A 02/08/2021   Procedure: TRANSURETHRAL RESECTION OF BLADDER TUMOR (TURBT);  Surgeon: [name], MD;  Location: [facility];  Service: Urology;  Laterality: N/A;    Family History  Problem Relation Age of Onset   Stroke Mother    Heart disease Father    Stroke Father    Diabetes Father    Obesity  Brother    Obesity Brother    Cancer Maternal Grandfather    Cancer Maternal Grandmother    Drug abuse Daughter     Social History:  reports that he quit smoking about 35 years ago. His smoking use included cigarettes. He has a 45.00 pack-year smoking history. He has never used smokeless tobacco. He reports that he does not currently use alcohol. He reports that he does not use drugs.  Allergies:  Allergies  Allergen Reactions   Hydrocodone Other (See Comments)    Causes constipation even with stool softner    Medications: I have reviewed the patient's current medications.  Results for orders placed or performed during the hospital encounter of 11/07/2021 (from the past 48 hour(s))  Lactic acid, plasma     Status: Abnormal   Collection Time: 11/05/2021 10:16 AM  Result Value Ref Range   Lactic Acid, Venous 4.6 (HH) 0.5 - 1.9 mmol/L    Comment: CRITICAL RESULT CALLED TO, READ BACK BY AND VERIFIED WITH: WOODY,A. RN AT 1106 11/21/2021 MULLINS,T Performed at [facility], [place] [address] [place], [place] [address]   Comprehensive metabolic panel     Status: Abnormal   Collection Time: 11/20/2021 10:16 AM  Result Value Ref Range   Sodium 131 (L) 135 - 145 mmol/L   Potassium 5.2 (H) 3.5 - 5.1 mmol/L   Chloride 98 98 - 111 mmol/L   CO2 21 (L) 22 - 32 mmol/L   Glucose, Bld 88 70 - 99 mg/dL    Comment: Glucose reference range applies only to samples taken after fasting for at least 8 hours.   BUN 43 (H) 8 - 23 mg/dL   Creatinine, Ser 2.87 (H) 0.61 - 1.24 mg/dL   Calcium 7.5 (L) 8.9 - 10.3 mg/dL   Total Protein 5.4 (L) 6.5 - 8.1 g/dL   Albumin 2.1 (L) 3.5 - 5.0 g/dL   AST 49 (H) 15 - 41 U/L   ALT 21 0 - 44 U/L   Alkaline Phosphatase 57 38 - 126 U/L   Total Bilirubin 1.0 0.3 - 1.2 mg/dL   GFR, Estimated 22 (L) >60 mL/min    Comment: (NOTE) Calculated using the CKD-EPI Creatinine Equation (2021)    Anion gap 12 5 - 15    Comment: Performed at [facility], [place] [address], [place], [place] [address]  CBC WITH DIFFERENTIAL     Status: Abnormal   Collection Time: 11/08/2021 10:16 AM  Result Value Ref Range   WBC 7.0 4.0 - 10.5 K/uL   RBC 3.08 (L) 4.22 - 5.81 MIL/uL   Hemoglobin 9.2 (L) 13.0 - 17.0 g/dL   HCT 28.2 (L) 39.0 - 52.0 %   MCV 91.6 80.0 - 100.0 fL   MCH 29.9 26.0 - 34.0 pg   MCHC 32.6 30.0 - 36.0 g/dL   RDW 14.3 11.5 - 15.5 %   Platelets 364 150 - 400 K/uL  nRBC 0.3 (H) 0.0 - 0.2 %   Neutrophils Relative % 87 %   Neutro Abs 6.2 1.7 - 7.7 K/uL   Lymphocytes Relative 5 %   Lymphs Abs 0.3 (L) 0.7 - 4.0 K/uL   Monocytes Relative 5 %   Monocytes Absolute 0.3 0.1 - 1.0 K/uL   Eosinophils Relative 0 %   Eosinophils Absolute 0.0 0.0 - 0.5 K/uL   Basophils Relative 1 %   Basophils Absolute 0.0 0.0 - 0.1 K/uL   WBC Morphology MILD LEFT SHIFT (1-5% METAS, OCC MYELO, OCC BANDS)     Comment: TOXIC GRANULATION   Immature Granulocytes 2 %   Abs Immature Granulocytes 0.12 (H) 0.00 - 0.07 K/uL    Comment: Performed at [facility], [place] [address], [place], [place] [address]  Protime-INR     Status: Abnormal   Collection Time: 11/04/2021 10:16 AM  Result Value Ref Range   Prothrombin Time 17.0 (H) 11.4 - 15.2 seconds   INR 1.4 (H) 0.8 - 1.2    Comment: (NOTE) INR goal varies based on device and disease states. Performed at [facility], [place] [address], [place], [place] [address]   APTT     Status: Abnormal   Collection Time: 11/16/2021 10:16 AM  Result Value Ref Range   aPTT 38 (H) 24 - 36 seconds    Comment:        IF BASELINE aPTT IS ELEVATED, SUGGEST PATIENT RISK ASSESSMENT BE USED TO DETERMINE APPROPRIATE ANTICOAGULANT THERAPY. Performed at [facility], [place] [address], [place], [place] [address]   Blood Culture (routine x 2)     Status: None (Preliminary result)   Collection Time: 11/02/2021 10:16 AM   Specimen: BLOOD RIGHT ARM  Result Value Ref Range    Specimen Description BLOOD RIGHT ARM    Special Requests      BOTTLES DRAWN AEROBIC AND ANAEROBIC Blood Culture adequate volume Performed at [facility], [place] [address] [place], [place] [address]    Culture PENDING    Report Status PENDING   Urinalysis, Routine w reflex microscopic     Status: Abnormal   Collection Time: 11/06/2021 10:16 AM  Result Value Ref Range   Color, Urine AMBER (A) YELLOW    Comment: BIOCHEMICALS MAY BE AFFECTED BY COLOR   APPearance CLOUDY (A) CLEAR   Specific Gravity, Urine 1.018 1.005 - 1.030   pH 6.0 5.0 - 8.0   Glucose, UA NEGATIVE NEGATIVE mg/dL   Hgb urine dipstick LARGE (A) NEGATIVE   Bilirubin Urine NEGATIVE NEGATIVE   Ketones, ur NEGATIVE NEGATIVE mg/dL   Protein, ur 100 (A) NEGATIVE mg/dL   Nitrite NEGATIVE NEGATIVE   Leukocytes,Ua LARGE (A) NEGATIVE   RBC / HPF >50 (H) 0 - 5 RBC/hpf   WBC, UA >50 (H) 0 - 5 WBC/hpf   Bacteria, UA MANY (A) NONE SEEN   Squamous Epithelial / LPF 0-5 0 - 5   WBC Clumps PRESENT    Mucus PRESENT    Budding Yeast PRESENT     Comment: Performed at [facility], [place] [address], [place], [place] [address]  CBG monitoring, ED     Status: None   Collection Time: 11/13/2021 10:19 AM  Result Value Ref Range   Glucose-Capillary 83 70 - 99 mg/dL    Comment: Glucose reference range applies only to samples taken after fasting for at least 8 hours.  Blood Culture (routine x 2)  Status: None (Preliminary result)   Collection Time: 11/15/2021 10:21 AM   Specimen: BLOOD RIGHT WRIST  Result Value Ref Range   Specimen Description      BLOOD RIGHT WRIST Performed at [facility], [address], [place], [place] [address]    Special Requests      BOTTLES DRAWN AEROBIC ONLY Blood Culture adequate volume Performed at [place] [address] [place], [place] [address]    Culture PENDING    Report Status PENDING   I-Stat Chem 8, ED     Status: Abnormal   Collection Time:  11/18/2021 10:29 AM  Result Value Ref Range   Sodium 129 (L) 135 - 145 mmol/L   Potassium 5.6 (H) 3.5 - 5.1 mmol/L   Chloride 97 (L) 98 - 111 mmol/L   BUN 49 (H) 8 - 23 mg/dL   Creatinine, Ser 3.00 (H) 0.61 - 1.24 mg/dL   Glucose, Bld 86 70 - 99 mg/dL    Comment: Glucose reference range applies only to samples taken after fasting for at least 8 hours.   Calcium, Ion 0.98 (L) 1.15 - 1.40 mmol/L   TCO2 22 22 - 32 mmol/L   Hemoglobin 9.5 (L) 13.0 - 17.0 g/dL   HCT 28.0 (L) 39.0 - 52.0 %  Resp Panel by RT-PCR (Flu A&B, Covid) Nasopharyngeal Swab     Status: None   Collection Time: 11/25/2021 11:18 AM   Specimen: Nasopharyngeal Swab; Nasopharyngeal(NP) swabs in vial transport medium  Result Value Ref Range   SARS Coronavirus 2 by RT PCR NEGATIVE NEGATIVE    Comment: (NOTE) SARS-CoV-2 target nucleic acids are NOT DETECTED.  The SARS-CoV-2 RNA is generally detectable in upper respiratory specimens during the acute phase of infection. The lowest concentration of SARS-CoV-2 viral copies this assay can detect is 138 copies/mL. A negative result does not preclude SARS-Cov-2 infection and should not be used as the sole basis for treatment or other patient management decisions. A negative result may occur with  improper specimen collection/handling, submission of specimen other than nasopharyngeal swab, presence of viral mutation(s) within the areas targeted by this assay, and inadequate number of viral copies(<138 copies/mL). A negative result must be combined with clinical observations, patient history, and epidemiological information. The expected result is Negative.  Fact Sheet for Patients:  EntrepreneurPulse.com.au  Fact Sheet for Healthcare Providers:  IncredibleEmployment.be  This test is no t yet approved or cleared by the [place] FDA and  has been authorized for detection and/or diagnosis of SARS-CoV-2 by FDA under an Emergency Use  Authorization (EUA). This EUA will remain  in effect (meaning this test can be used) for the duration of the COVID-19 declaration under Section 564(b)(1) of the Act, 21 U.S.C.section 360bbb-3(b)(1), unless the authorization is terminated  or revoked sooner.       Influenza A by PCR NEGATIVE NEGATIVE   Influenza B by PCR NEGATIVE NEGATIVE    Comment: (NOTE) The Xpert Xpress SARS-CoV-2/FLU/RSV plus assay is intended as an aid in the diagnosis of influenza from Nasopharyngeal swab specimens and should not be used as a sole basis for treatment. Nasal washings and aspirates are unacceptable for Xpert Xpress SARS-CoV-2/FLU/RSV testing.  Fact Sheet for Patients: EntrepreneurPulse.com.au  Fact Sheet for Healthcare Providers: IncredibleEmployment.be  This test is not yet approved or cleared by the [place] FDA and has been authorized for detection and/or diagnosis of SARS-CoV-2 by FDA under an Emergency Use Authorization (EUA). This EUA will remain in effect (  meaning this test can be used) for the duration of the COVID-19 declaration under Section 564(b)(1) of the Act, 21 U.S.C. section 360bbb-3(b)(1), unless the authorization is terminated or revoked.  Performed at [facility], [place] [address], [place], [place] [address]   Lactic acid, plasma     Status: Abnormal   Collection Time: 11/16/2021 12:16 PM  Result Value Ref Range   Lactic Acid, Venous 4.5 (HH) 0.5 - 1.9 mmol/L    Comment: CRITICAL VALUE NOTED.  VALUE IS CONSISTENT WITH PREVIOUSLY REPORTED AND CALLED VALUE. Performed at [facility], [place] [address] [place], [place] [address]     DG Chest Port 1 View  Result Date: 11/02/2021 CLINICAL DATA:  Questionable sepsis EXAM: PORTABLE CHEST 1 VIEW COMPARISON:  10/24/2021 FINDINGS: Stable interstitial coarsening with interstitial lung disease by June 2022 CT. There is no edema, consolidation, effusion,  or pneumothorax. Normal heart size and mediastinal contours. IMPRESSION: Interstitial lung disease.  No acute superimposed finding. Electronically Signed   By: Jorje Guild M.D.   On: 11/17/2021 10:44   CT CHEST ABDOMEN PELVIS WO CONTRAST  Result Date: 11/02/2021 CLINICAL DATA:  Hypotension, shock EXAM: CT CHEST, ABDOMEN AND PELVIS WITHOUT CONTRAST TECHNIQUE: Multidetector CT imaging of the chest, abdomen and pelvis was performed following the standard protocol without IV contrast. COMPARISON:  CT abdomen and pelvis 06/09/2021 FINDINGS: CT CHEST FINDINGS Cardiovascular: Heart size is normal. No pericardial effusion identified. Coronary artery calcifications noted. Main pulmonary artery is normal caliber. Note is made of a small amount of air in the brachiocephalic vein. Thoracic aorta is normal in caliber with mild calcified plaques. Mediastinum/Nodes: No bulky axillary, mediastinal or hilar lymphadenopathy identified. Lungs/Pleura: Lungs are hyperinflated with mild emphysematous changes. There is mild interlobular septal thickening which is new since previous study. Dependent subsegmental atelectatic changes bilaterally. No focal consolidation visualized. No significant pleural effusion. No pneumothorax. Musculoskeletal: No suspicious bony lesions identified in the chest. Small foci of subcutaneous or vascular air identified in the right upper chest region as well as in the subcutaneous plane in the lower chest extending to the abdomen. CT ABDOMEN PELVIS FINDINGS Hepatobiliary: No focal liver abnormality is seen. No gallstones, gallbladder wall thickening, or biliary dilatation. Pancreas: Unremarkable. No pancreatic ductal dilatation or surrounding inflammatory changes. Spleen: Normal in size without focal abnormality. Adrenals/Urinary Tract: Adrenal glands appear normal. Recent cystoprostatectomy surgical changes. No nephrolithiasis or hydronephrosis identified bilaterally. Bilateral ureteral stents which  terminate distally through an ostomy in the right abdomen. Stomach/Bowel: Limited evaluation of bowel due to lack of contrast. There are multiple loops of distended small bowel with air-fluid levels, mostly in the left abdomen which measure up to 4.9 cm in diameter. Accompanying mesenteric engorgement/fat stranding. No pneumatosis identified. Distal loops of ileum are decompressed. Postsurgical changes in the right lower quadrant. Multiple foci of extraluminal air visualized in the right pelvis and a few in the upper abdomen. Retained fecal material in the colon and rectum. Vascular/Lymphatic: Aortic atherosclerosis. No enlarged abdominal or pelvic lymph nodes. Reproductive: Prostate gland surgically absent. Other: Subcutaneous emphysema identified throughout the anterior abdomen, pelvis and anterior aspect of the visualized proximal lower extremities. No associated defined fluid collections. Small to moderate volume ascites mostly in the pelvis and in the left abdomen Musculoskeletal: Degenerative and postsurgical changes of the lumbar spine. No suspicious bony lesions visualized. IMPRESSION: 1. Findings concerning for small bowel obstruction, with proximal loops of small bowel measuring up to 4.9 cm in diameter with air-fluid levels. Possible transition  point in the lower abdomen/pelvis near a surgical anastomosis. 2. Small to moderate volume ascites mostly located in the pelvis and left abdomen. 3. Small amount of free intraperitoneal air, mostly in the right pelvis surrounded by ascites. May be postoperative in nature, correlate clinically and follow-up recommended. 4. Small to moderate amount of subcutaneous emphysema mostly in the anterior abdominal and pelvic walls. No accompanying defined fluid collections or significant adjacent inflammatory fat stranding. 5. Cystoprostatectomy changes with right-sided urinary ostomy. Bilateral ureteral stents extending through the ostomy. No hydronephrosis. 6. Mild  interstitial pulmonary edema. Emphysematous and subsegmental atelectatic changes. Discussed with Dr. Regenia Skeeter over the telephone at 1:09 p.m. on 11/19/2021 with read back. Electronically Signed   By: Ofilia Neas M.D.   On: 11/24/2021 13:26    Review of Systems  Constitutional:  Positive for chills, diaphoresis and fatigue.  HENT: Negative.    Eyes: Negative.   Gastrointestinal:  Positive for abdominal pain.  Endocrine: Negative.   Genitourinary: Negative.   Musculoskeletal: Negative.   Skin: Negative.   Allergic/Immunologic: Negative.   Neurological: Negative.   Hematological: Negative.   Blood pressure 90/62, pulse 90, temperature 97.9 F (36.6 C), temperature source Rectal, resp. rate 17, weight 79.4 kg, SpO2 98 %. Physical Exam Vitals reviewed.  Constitutional:      Comments: Ill appearing, diaphoretic. Wife at bedside. NGT now in place.   HENT:     Head: Normocephalic.  Eyes:     Pupils: Pupils are equal, round, and reactive to light.  Cardiovascular:     Rate and Rhythm: Tachycardia present.  Abdominal:     General: Abdomen is flat.     Comments: RLQ Urostomy pink and patet with Rt (red) and Lt (blue) stents and copious urine and scant mucus.   Midline incision c/d/I. Some mild TTP but no rebound / guarding. NO wound drainage.    Psychiatric:        Mood and Affect: Mood normal.    Assessment/Plan:  Greatly appreciate ER, Gen Surg, Medical teams eval.  Overall picture most c/w post-op sepsis likely urinary source (felt most likely) v. Intra-abdominal. He has long h/o recurrent bowel obstruction and is not simple surgical candidate. Do not recommend any sort of abdominal exploration given relatively simple fluid and no free intraperitoneal air. Broad spectrum ABX, hydration, colloid, NGT likely most prudent initial management.  Please call me directly with questions any time.   [name] 11/27/2021, 3:08 PM

## 2021-11-09 NOTE — Progress Notes (Signed)
Secure chat with Abby RN re PIV access.  States has 2 PIV's working well at this time.  Waiting transfer to ICU.  Ok to place in ICU.  Will notify if IV needs change.

## 2021-11-09 NOTE — Procedures (Signed)
Central Venous Catheter Insertion Procedure Note  SOLACE WENDORFF  [id number]  [birth year]  Date:11/07/2021  Time:6:37 PM   Provider Performing:Raffael Bugarin Naomie Dean   Procedure: Insertion of Non-tunneled Central Venous [phone]) with US guidance ([id number])   Indication(s) Medication administration  Consent Risks of the procedure as well as the alternatives and risks of each were explained to the patient and/or caregiver.  Consent for the procedure was obtained and is signed in the bedside chart  Anesthesia Topical only with 1% lidocaine   Timeout Verified patient identification, verified procedure, site/side was marked, verified correct patient position, special equipment/implants available, medications/allergies/relevant history reviewed, required imaging and test results available.  Sterile Technique Maximal sterile technique including full sterile barrier drape, hand hygiene, sterile gown, sterile gloves, mask, hair covering, sterile ultrasound probe cover (if used).  Procedure Description Area of catheter insertion was cleaned with chlorhexidine and draped in sterile fashion.  With real-time ultrasound guidance a central venous catheter was placed into the right internal jugular vein. Guidewire confirmed in IJ on [place] prior to dilation. Nonpulsatile blood flow and easy flushing noted in all ports.  The catheter was sutured in place and sterile dressing applied.  Complications/Tolerance None; patient tolerated the procedure well. Chest X-ray is ordered to verify placement for internal jugular or subclavian cannulation.   Chest x-ray is not ordered for femoral cannulation.  EBL Minimal  Specimen(s) None  Julian Hy, DO 11/23/2021 6:37 PM [facility]

## 2021-11-09 NOTE — ED Notes (Signed)
NG tube removed d/t placement being in wrong spot

## 2021-11-09 NOTE — Progress Notes (Signed)
Pharmacy Antibiotic Note  William [name] is a 80 y.o. male admitted on 11/04/2021 with sepsis.  Pharmacy has been consulted for vancomycin and cefepime dosing.  Plan: Vancomycin 1500mg  IV x 1 given in ED at 11:18, check random vancomycin level in AM and redose if < 15 mcg/ml Cefepime 2g IV q24h  Weight: 79.4 kg (175 lb)  Temp (24hrs), Avg:97.9 F (36.6 C), Min:97.9 F (36.6 C), Max:97.9 F (36.6 C)  Recent Labs  Lab 10/31/2021 1016 11/04/2021 1029 11/13/2021 1216  WBC 7.0  --   --   CREATININE 2.87* 3.00*  --   LATICACIDVEN 4.6*  --  4.5*    Estimated Creatinine Clearance: 20.2 mL/min (A) (by C-G formula based on SCr of 3 mg/dL (H)).    Allergies  Allergen Reactions   Hydrocodone Other (See Comments)    Causes constipation even with stool softner    Antimicrobials this admission: 11/10 Vancomycin >> 11/10 Cefepime >>  Dose adjustments this admission:  Microbiology results: 11/10 BCx:  11/10 UCx:  Thank you for allowing pharmacy to be a part of this patient's care.  Peggyann Juba, PharmD, BCPS Pharmacy: [phone] 11/19/2021 4:16 PM

## 2021-11-09 NOTE — ED Provider Notes (Signed)
[place] DEPT Provider Note   CSN: [id number] Arrival date & time: 11/08/2021  1000     History Chief Complaint  Patient presents with   Hypotension    William [name] is a 80 y.o. male.  HPI 80 year old male presents via EMS from the urology office with hypotension.  History is taken from both patient and wife.  At that visit this morning his blood pressure was 51/38.  He had extensive urologic surgery on 7/27.  He states he has been having some constipation and abdominal discomfort ever since.  Last bowel movement was several days ago.  No vomiting.  He has acute on chronic back pain.  He thinks he has been having some low-grade fever and chills (up to 100.2 according to wife).  He has been dizzy ever since surgery as well.  He had swelling in his legs in the hospital although he thinks this is a little improved.  He was given a 500 cc bag of IV fluids that was started by urology and he has just now started a second bag by EMS.  Blood pressure now in the 70s.  He typically runs low, in the 90s.  However he has been running in the 70s over at least the last couple days according to the wife.  Past Medical History:  Diagnosis Date   Bladder cancer ([place])    Pt says small tumor not cancer   Bleeding gums 11/11/2015   COVID 09/23/2020   back ache hurt all over x few days all symptoms reolved   Dyslipidemia (high LDL; low HDL)    Low back pain 12/27/2014   Medial epicondylitis of right elbow 12/13/2014   Numbness and tingling of right leg    Tinnitus    all the time   Toenail fungus    2 big toes   Wears glasses     Patient Active Problem List   Diagnosis Date Noted   Septic shock ([place]) 11/23/2021   Port-A-Cath in place 04/04/2021   Bladder cancer ([place]) 02/28/2021   Flank pain 12/01/2020   History of hematuria 12/01/2020   Gross hematuria with bladder mass 09/21/2020   Acute pain of left shoulder 09/21/2020   Numbness 01/27/2020   Pain  01/27/2020   Trigger finger of right thumb 01/27/2020   Tinnitus 03/11/2019   Dizziness 06/12/2018   Chronic nonintractable headache 06/12/2018   Syncope 06/01/2018   Spider angioma 09/18/2017   Tremor of right hand 08/23/2017   Bilateral arm numbness and tingling while sleeping 02/08/2017   Dyspnea on exertion 02/07/2017   Allergic rhinitis 02/07/2017   Right leg numbness 02/07/2017   Enlarged lymph nodes 08/09/2016   Health care maintenance 08/09/2016   Plantar wart of left foot 06/28/2016   Lateral pain of right hip 12/20/2015   Left shoulder pain 11/29/2015   Finger pain, left 05/23/2015   Plantar wart of right foot 03/15/2015   Acute low back pain 12/27/2014   Left wrist pain 12/13/2014   Superficial bruising 08/04/2014   Toenail fungus 08/04/2014   AK (actinic keratosis) 08/04/2014   Seborrheic dermatitis of scalp 08/04/2014   Bilateral lower extremity edema 11/16/2013   Screening for colon cancer 09/28/2013   Impotence due to erectile dysfunction 08/26/2013   Radiculopathy with lower extremity symptoms 06/03/2013   Screening for abdominal aortic aneurysm 07/31/2012   Asthma-COPD overlap syndrome ([place]) 04/10/2012   KNEE PAIN, LEFT 05/16/2010   Hyperlipidemia 04/29/2007   Hyperplasia of prostate 02/27/2007  Spinal stenosis of lumbar region 02/27/2007    Past Surgical History:  Procedure Laterality Date   bone chip removed from right heel  1998   CYSTOSCOPY W/ RETROGRADES Bilateral 02/08/2021   Procedure: CYSTOSCOPY WITH RETROGRADE PYELOGRAM;  Surgeon: [name], MD;  Location: [facility];  Service: Urology;  Laterality: Bilateral;   CYSTOSCOPY WITH INJECTION N/A 10/25/2021   Procedure: CYSTOSCOPY WITH INJECTION OF INDOCYANINE GREEN DYE;  Surgeon: [name], MD;  Location: [facility];  Service: Urology;  Laterality: N/A;   FINGER AMPUTATION     injured by table saw, left 4th and 5th fingers   IR IMAGING GUIDED PORT INSERTION  03/07/2021   IR  REMOVAL TUN ACCESS W/ PORT W/O FL MOD SED  06/29/2021   KNEE ARTHROSCOPY W/ MENISCAL REPAIR  2012 2014   left   KNEE ARTHROSCOPY W/ MENISCAL REPAIR  11/2011   left   LACERATION REPAIR  1962   forehead   LAPAROSCOPIC LYSIS OF ADHESIONS N/A 10/25/2021   Procedure: EXTENSIVE LAPAROSCOPIC LYSIS OF ADHESIONS;  Surgeon: [name], MD;  Location: [facility];  Service: Urology;  Laterality: N/A;   LUMBAR DISC SURGERY  09/2007   bone spurs, stenosis, L3-4, 4-5    LYMPH NODE DISSECTION Bilateral 10/25/2021   Procedure: LYMPH NODE DISSECTION;  Surgeon: [name], MD;  Location: [facility];  Service: Urology;  Laterality: Bilateral;   POSTERIOR LUMBAR FUSION 4 LEVEL N/A 10/13/2013   Procedure: Lumbar Two-Three, Lumbar Three-Four, Lumbar Four-Five, Lumbar Five-Sacral One posterior lumbar interbody fusion with interbody prosthesis posterior lateral arthrodesis and posterior segmental instrumentation;  Surgeon: [name], MD;  Location: [facility];  Service: Neurosurgery;  Laterality: N/A;  POSTERIOR LUMBAR FUSION 4 LEVEL   ROBOT ASSISTED LAPAROSCOPIC COMPLETE CYSTECT ILEAL CONDUIT N/A 10/25/2021   Procedure: XI ROBOTIC ASSISTED LAPAROSCOPIC COMPLETE CYSTECT ILEAL CONDUIT/RADICAL PROSTATECTOMY AND SMALL BOWEL RESECTION;  Surgeon: [name], MD;  Location: [facility];  Service: Urology;  Laterality: N/A;  6 HRS   SKIN GRAFT     from right thigh to left forehead   SMALL INTESTINE SURGERY  7654   ileitis complications   TRANSURETHRAL RESECTION OF BLADDER TUMOR N/A 02/08/2021   Procedure: TRANSURETHRAL RESECTION OF BLADDER TUMOR (TURBT);  Surgeon: [name], MD;  Location: [facility];  Service: Urology;  Laterality: N/A;       Family History  Problem Relation Age of Onset   Stroke Mother    Heart disease Father    Stroke Father    Diabetes Father    Obesity Brother    Obesity Brother    Cancer Maternal Grandfather    Cancer Maternal Grandmother    Drug abuse  Daughter     Social History   Tobacco Use   Smoking status: Former    Packs/day: 1.50    Years: 30.00    Pack years: 45.00    Types: Cigarettes    Quit date: 04/02/1986    Years since quitting: 35.6   Smokeless tobacco: Never   Tobacco comments:    no plans to start  Vaping Use   Vaping Use: Never used  Substance Use Topics   Alcohol use: Not Currently   Drug use: Never    Home Medications Prior to Admission medications   Medication Sig Start Date End Date Taking? Authorizing Provider  acetaminophen (TYLENOL) 500 MG tablet Take 1,000 mg by mouth every 4 (four) hours as needed for mild pain or fever.   Yes [provider]  fluticasone (FLONASE) 50 MCG/ACT nasal spray Place 2 sprays into both nostrils daily. 01/05/20  Yes [name], MD  gabapentin (NEURONTIN) 300 MG capsule TAKE 3 CAPSULES BY MOUTH AT BEDTIME Patient taking differently: Take 900 mg by mouth at bedtime. 03/09/21  Yes Patel, [name], DO  ibuprofen (ADVIL) 200 MG tablet Take 600 mg by mouth every 4 (four) hours as needed for mild pain.   Yes [provider]  ondansetron (ZOFRAN-ODT) 8 MG disintegrating tablet Take 8 mg by mouth every 8 (eight) hours as needed for nausea/vomiting. 11/06/21  Yes [provider]  oxyCODONE-acetaminophen (PERCOCET) 5-325 MG tablet Take 1 tablet by mouth every 6 (six) hours as needed for severe pain. 11/02/21 11/02/22 Yes [name], MD  SPIRIVA HANDIHALER 18 MCG inhalation capsule PLACE 1 CAPSULE( 18 MCG TOTAL) INTO INHALER AND INHALE DAILY Patient taking differently: Place 18 mcg into inhaler and inhale daily. 08/28/21  Yes [name], MD  tiZANidine (ZANAFLEX) 4 MG tablet Take 4 mg by mouth at bedtime.   Yes [provider]    Allergies    Hydrocodone  Review of Systems   Review of Systems  Respiratory:  Negative for cough and shortness of breath.   Gastrointestinal:  Positive for abdominal pain and constipation. Negative for  vomiting.  Musculoskeletal:  Positive for back pain.  Neurological:  Positive for dizziness.  All other systems reviewed and are negative.  Physical Exam Updated Vital Signs BP 90/62   Pulse 90   Temp 97.9 F (36.6 C) (Rectal)   Resp 17   Wt 79.4 kg   SpO2 98%   BMI 27.41 kg/m   Physical Exam Vitals and nursing note reviewed.  Constitutional:      Appearance: He is well-developed. He is ill-appearing.  HENT:     Head: Normocephalic and atraumatic.     Right Ear: External ear normal.     Left Ear: External ear normal.     Nose: Nose normal.  Eyes:     General:        Right eye: No discharge.        Left eye: No discharge.  Cardiovascular:     Rate and Rhythm: Normal rate and regular rhythm.     Heart sounds: Normal heart sounds.  Pulmonary:     Effort: Pulmonary effort is normal.     Breath sounds: Normal breath sounds.  Abdominal:     General: There is distension.     Palpations: Abdomen is soft.     Tenderness: There is abdominal tenderness.     Comments: Diffuse abdominal tenderness, worst on the left side.  Seems diffusely distended  Musculoskeletal:     Cervical back: Neck supple.     Right lower leg: Edema present.     Left lower leg: Edema present.     Comments: Pitting edema to the lateral lower extremities  Skin:    General: Skin is warm and dry.  Neurological:     Mental Status: He is alert and oriented to person, place, and time.  Psychiatric:        Mood and Affect: Mood is not anxious.    ED Results / Procedures / Treatments   Labs (all labs ordered are listed, but only abnormal results are displayed) Labs Reviewed  LACTIC ACID, PLASMA - Abnormal; Notable for the following components:      Result Value   Lactic Acid, Venous 4.6 (*)    All other components  within normal limits  LACTIC ACID, PLASMA - Abnormal; Notable for the following components:   Lactic Acid, Venous 4.5 (*)    All other components within normal limits  COMPREHENSIVE  METABOLIC PANEL - Abnormal; Notable for the following components:   Sodium 131 (*)    Potassium 5.2 (*)    CO2 21 (*)    BUN 43 (*)    Creatinine, Ser 2.87 (*)    Calcium 7.5 (*)    Total Protein 5.4 (*)    Albumin 2.1 (*)    AST 49 (*)    GFR, Estimated 22 (*)    All other components within normal limits  CBC WITH DIFFERENTIAL/PLATELET - Abnormal; Notable for the following components:   RBC 3.08 (*)    Hemoglobin 9.2 (*)    HCT 28.2 (*)    nRBC 0.3 (*)    Lymphs Abs 0.3 (*)    Abs Immature Granulocytes 0.12 (*)    All other components within normal limits  PROTIME-INR - Abnormal; Notable for the following components:   Prothrombin Time 17.0 (*)    INR 1.4 (*)    All other components within normal limits  APTT - Abnormal; Notable for the following components:   aPTT 38 (*)    All other components within normal limits  URINALYSIS, ROUTINE W REFLEX MICROSCOPIC - Abnormal; Notable for the following components:   Color, Urine AMBER (*)    APPearance CLOUDY (*)    Hgb urine dipstick LARGE (*)    Protein, ur 100 (*)    Leukocytes,Ua LARGE (*)    RBC / HPF >50 (*)    WBC, UA >50 (*)    Bacteria, UA MANY (*)    All other components within normal limits  I-STAT CHEM 8, ED - Abnormal; Notable for the following components:   Sodium 129 (*)    Potassium 5.6 (*)    Chloride 97 (*)    BUN 49 (*)    Creatinine, Ser 3.00 (*)    Calcium, Ion 0.98 (*)    Hemoglobin 9.5 (*)    HCT 28.0 (*)    All other components within normal limits  RESP PANEL BY RT-PCR (FLU A&B, COVID) ARPGX2  CULTURE, BLOOD (ROUTINE X 2)  CULTURE, BLOOD (ROUTINE X 2)  URINE CULTURE  CBG MONITORING, ED    EKG EKG Interpretation  Date/Time:  Thursday November 09 2021 10:51:44 EST Ventricular Rate:  74 PR Interval:  167 QRS Duration: 82 QT Interval:  414 QTC Calculation: 460 R Axis:   65 Text Interpretation: Sinus rhythm Ventricular premature complex no acute ST/T changes Confirmed by Sherwood Gambler  [phone]) on 11/02/2021 11:13:37 AM  Radiology DG Chest Port 1 View  Result Date: 11/12/2021 CLINICAL DATA:  Questionable sepsis EXAM: PORTABLE CHEST 1 VIEW COMPARISON:  10/24/2021 FINDINGS: Stable interstitial coarsening with interstitial lung disease by June 2022 CT. There is no edema, consolidation, effusion, or pneumothorax. Normal heart size and mediastinal contours. IMPRESSION: Interstitial lung disease.  No acute superimposed finding. Electronically Signed   By: Jorje Guild M.D.   On: 11/22/2021 10:44   CT CHEST ABDOMEN PELVIS WO CONTRAST  Result Date: 11/27/2021 CLINICAL DATA:  Hypotension, shock EXAM: CT CHEST, ABDOMEN AND PELVIS WITHOUT CONTRAST TECHNIQUE: Multidetector CT imaging of the chest, abdomen and pelvis was performed following the standard protocol without IV contrast. COMPARISON:  CT abdomen and pelvis 06/09/2021 FINDINGS: CT CHEST FINDINGS Cardiovascular: Heart size is normal. No pericardial effusion identified. Coronary artery calcifications noted. Main  pulmonary artery is normal caliber. Note is made of a small amount of air in the brachiocephalic vein. Thoracic aorta is normal in caliber with mild calcified plaques. Mediastinum/Nodes: No bulky axillary, mediastinal or hilar lymphadenopathy identified. Lungs/Pleura: Lungs are hyperinflated with mild emphysematous changes. There is mild interlobular septal thickening which is new since previous study. Dependent subsegmental atelectatic changes bilaterally. No focal consolidation visualized. No significant pleural effusion. No pneumothorax. Musculoskeletal: No suspicious bony lesions identified in the chest. Small foci of subcutaneous or vascular air identified in the right upper chest region as well as in the subcutaneous plane in the lower chest extending to the abdomen. CT ABDOMEN PELVIS FINDINGS Hepatobiliary: No focal liver abnormality is seen. No gallstones, gallbladder wall thickening, or biliary dilatation. Pancreas:  Unremarkable. No pancreatic ductal dilatation or surrounding inflammatory changes. Spleen: Normal in size without focal abnormality. Adrenals/Urinary Tract: Adrenal glands appear normal. Recent cystoprostatectomy surgical changes. No nephrolithiasis or hydronephrosis identified bilaterally. Bilateral ureteral stents which terminate distally through an ostomy in the right abdomen. Stomach/Bowel: Limited evaluation of bowel due to lack of contrast. There are multiple loops of distended small bowel with air-fluid levels, mostly in the left abdomen which measure up to 4.9 cm in diameter. Accompanying mesenteric engorgement/fat stranding. No pneumatosis identified. Distal loops of ileum are decompressed. Postsurgical changes in the right lower quadrant. Multiple foci of extraluminal air visualized in the right pelvis and a few in the upper abdomen. Retained fecal material in the colon and rectum. Vascular/Lymphatic: Aortic atherosclerosis. No enlarged abdominal or pelvic lymph nodes. Reproductive: Prostate gland surgically absent. Other: Subcutaneous emphysema identified throughout the anterior abdomen, pelvis and anterior aspect of the visualized proximal lower extremities. No associated defined fluid collections. Small to moderate volume ascites mostly in the pelvis and in the left abdomen Musculoskeletal: Degenerative and postsurgical changes of the lumbar spine. No suspicious bony lesions visualized. IMPRESSION: 1. Findings concerning for small bowel obstruction, with proximal loops of small bowel measuring up to 4.9 cm in diameter with air-fluid levels. Possible transition point in the lower abdomen/pelvis near a surgical anastomosis. 2. Small to moderate volume ascites mostly located in the pelvis and left abdomen. 3. Small amount of free intraperitoneal air, mostly in the right pelvis surrounded by ascites. May be postoperative in nature, correlate clinically and follow-up recommended. 4. Small to moderate amount  of subcutaneous emphysema mostly in the anterior abdominal and pelvic walls. No accompanying defined fluid collections or significant adjacent inflammatory fat stranding. 5. Cystoprostatectomy changes with right-sided urinary ostomy. Bilateral ureteral stents extending through the ostomy. No hydronephrosis. 6. Mild interstitial pulmonary edema. Emphysematous and subsegmental atelectatic changes. Discussed with Dr. Regenia Skeeter over the telephone at 1:09 p.m. on 11/17/2021 with read back. Electronically Signed   By: Ofilia Neas M.D.   On: 11/17/2021 13:26    Procedures .Critical Care Performed by: Sherwood Gambler, MD Authorized by: Sherwood Gambler, MD   Critical care provider statement:    Critical care time (minutes):  55   Critical care time was exclusive of:  Separately billable procedures and treating other patients   Critical care was necessary to treat or prevent imminent or life-threatening deterioration of the following conditions:  Shock, renal failure and sepsis   Critical care was time spent personally by me on the following activities:  Development of treatment plan with patient or surrogate, discussions with consultants, discussions with primary provider, evaluation of patient's response to treatment, examination of patient, obtaining history from patient or surrogate, ordering and performing treatments and interventions,  ordering and review of laboratory studies, ordering and review of radiographic studies, pulse oximetry, re-evaluation of patient's condition and review of old charts   Medications Ordered in ED Medications  0.9 %  sodium chloride infusion (250 mLs Intravenous New Bag/Given 11/03/2021 1205)  norepinephrine (LEVOPHED) 4mg  in 261mL premix infusion (6 mcg/min Intravenous Rate/Dose Change 11/17/2021 1407)  lactated ringers bolus 1,000 mL (has no administration in time range)  ondansetron (ZOFRAN) injection 4 mg (has no administration in time range)    Or  ondansetron  (ZOFRAN) 8 mg in sodium chloride 0.9 % 50 mL IVPB (has no administration in time range)  lip balm (CARMEX) ointment 1 application (has no administration in time range)  phenol (CHLORASEPTIC) mouth spray 2 spray (has no administration in time range)  menthol-cetylpyridinium (CEPACOL) lozenge 3 mg (has no administration in time range)  magic mouthwash (has no administration in time range)  simethicone (MYLICON) 40 RW/4.3XV suspension 80 mg (has no administration in time range)  diphenhydrAMINE (BENADRYL) injection 12.5-25 mg (has no administration in time range)  lactated ringers bolus 1,000 mL (0 mLs Intravenous Stopped 11/25/2021 1133)    And  lactated ringers bolus 1,000 mL (1,000 mLs Intravenous New Bag/Given 11/28/2021 1102)    And  lactated ringers bolus 250 mL (0 mLs Intravenous Stopped 11/16/2021 1111)  ceFEPIme (MAXIPIME) 2 g in sodium chloride 0.9 % 100 mL IVPB (0 g Intravenous Stopped 11/14/2021 1133)  metroNIDAZOLE (FLAGYL) IVPB 500 mg (500 mg Intravenous New Bag/Given 11/28/2021 1103)  vancomycin (VANCOREADY) IVPB 1500 mg/300 mL (1,500 mg Intravenous New Bag/Given 10/31/2021 1118)  lactated ringers bolus 1,000 mL (1,000 mLs Intravenous New Bag/Given 11/03/2021 1204)  lactated ringers bolus 1,000 mL (1,000 mLs Intravenous New Bag/Given 11/21/2021 1439)  lactated ringers bolus 1,000 mL (1,000 mLs Intravenous New Bag/Given 11/07/2021 1444)    ED Course  I have reviewed the triage vital signs and the nursing notes.  Pertinent labs & imaging results that were available during my care of the patient were reviewed by me and considered in my medical decision making (see chart for details).    MDM Rules/Calculators/A&P                           Patient presents in shock.  He was started on IV fluids and seem to temporarily partially respond but he never really came up beyond 70 systolic.  While he does typically run low, he kept dropping and so he was placed on peripheral Levophed.  This did  significantly bring up his blood pressure even on a low dose.  He was already given broad IV antibiotics for presumed sepsis.  While his white count is normal, everything else is pointing towards a sepsis.  Discussed with Dr. Junious Silk, who feels that is likely not an immediate postop problem though these types of surgeries tend to get infected in the urinary system.  I also discussed with ICU who will admit.  Based on the small bowel obstruction findings I have discussed with general surgery who will consult.  He will be admitted to the ICU. Final Clinical Impression(s) / ED Diagnoses Final diagnoses:  Shock ([place])  Septic shock ([place])  Acute kidney injury ([place])  Small bowel obstruction ([place])    Rx / DC Orders ED Discharge Orders     None        Sherwood Gambler, MD 11/08/2021 1506

## 2021-11-09 NOTE — Progress Notes (Signed)
Notified bedside nurse of need to draw repeat lactic acid. 

## 2021-11-09 NOTE — Progress Notes (Signed)
Elink following code sepsis °

## 2021-11-10 ENCOUNTER — Inpatient Hospital Stay (HOSPITAL_COMMUNITY): Payer: Medicare HMO

## 2021-11-10 DIAGNOSIS — R9431 Abnormal electrocardiogram [ECG] [EKG]: Secondary | ICD-10-CM | POA: Diagnosis not present

## 2021-11-10 DIAGNOSIS — A419 Sepsis, unspecified organism: Secondary | ICD-10-CM | POA: Diagnosis not present

## 2021-11-10 DIAGNOSIS — N3000 Acute cystitis without hematuria: Secondary | ICD-10-CM | POA: Diagnosis not present

## 2021-11-10 DIAGNOSIS — R7881 Bacteremia: Secondary | ICD-10-CM | POA: Diagnosis not present

## 2021-11-10 DIAGNOSIS — J9811 Atelectasis: Secondary | ICD-10-CM | POA: Diagnosis not present

## 2021-11-10 DIAGNOSIS — J9601 Acute respiratory failure with hypoxia: Secondary | ICD-10-CM | POA: Diagnosis not present

## 2021-11-10 DIAGNOSIS — N179 Acute kidney failure, unspecified: Secondary | ICD-10-CM | POA: Diagnosis not present

## 2021-11-10 DIAGNOSIS — J811 Chronic pulmonary edema: Secondary | ICD-10-CM | POA: Diagnosis not present

## 2021-11-10 DIAGNOSIS — J90 Pleural effusion, not elsewhere classified: Secondary | ICD-10-CM | POA: Diagnosis not present

## 2021-11-10 DIAGNOSIS — E875 Hyperkalemia: Secondary | ICD-10-CM | POA: Diagnosis not present

## 2021-11-10 DIAGNOSIS — R6521 Severe sepsis with septic shock: Secondary | ICD-10-CM | POA: Diagnosis not present

## 2021-11-10 LAB — ECHOCARDIOGRAM COMPLETE
Area-P 1/2: 4.49 cm2
Calc EF: 53.2 %
Height: 67 in
S' Lateral: 2.9 cm
Single Plane A2C EF: 61.6 %
Single Plane A4C EF: 46.6 %
Weight: 2867.74 oz

## 2021-11-10 LAB — BLOOD CULTURE ID PANEL (REFLEXED) - BCID2

## 2021-11-10 LAB — GLUCOSE, CAPILLARY
Glucose-Capillary: 131 mg/dL — ABNORMAL HIGH (ref 70–99)
Glucose-Capillary: 54 mg/dL — ABNORMAL LOW (ref 70–99)
Glucose-Capillary: 87 mg/dL (ref 70–99)
Glucose-Capillary: 87 mg/dL (ref 70–99)
Glucose-Capillary: 88 mg/dL (ref 70–99)
Glucose-Capillary: 90 mg/dL (ref 70–99)
Glucose-Capillary: 91 mg/dL (ref 70–99)
Glucose-Capillary: 92 mg/dL (ref 70–99)

## 2021-11-10 LAB — BASIC METABOLIC PANEL
Anion gap: 11 (ref 5–15)
BUN: 35 mg/dL — ABNORMAL HIGH (ref 8–23)
CO2: 23 mmol/L (ref 22–32)
Calcium: 7.4 mg/dL — ABNORMAL LOW (ref 8.9–10.3)
Chloride: 100 mmol/L (ref 98–111)
Creatinine, Ser: 1.67 mg/dL — ABNORMAL HIGH (ref 0.61–1.24)
GFR, Estimated: 41 mL/min — ABNORMAL LOW (ref >60)
Glucose, Bld: 87 mg/dL (ref 70–99)
Potassium: 4.6 mmol/L (ref 3.5–5.1)
Sodium: 134 mmol/L — ABNORMAL LOW (ref 135–145)

## 2021-11-10 LAB — CBC
HCT: 28.1 % — ABNORMAL LOW (ref 39.0–52.0)
Hemoglobin: 9.2 g/dL — ABNORMAL LOW (ref 13.0–17.0)
MCH: 29.5 pg (ref 26.0–34.0)
MCHC: 32.7 g/dL (ref 30.0–36.0)
MCV: 90.1 fL (ref 80.0–100.0)
Platelets: 359 10*3/uL (ref 150–400)
RBC: 3.12 MIL/uL — ABNORMAL LOW (ref 4.22–5.81)
RDW: 14.6 % (ref 11.5–15.5)
WBC: 12.5 10*3/uL — ABNORMAL HIGH (ref 4.0–10.5)
nRBC: 0.2 % (ref 0.0–0.2)

## 2021-11-10 LAB — PHOSPHORUS: Phosphorus: 3.7 mg/dL (ref 2.5–4.6)

## 2021-11-10 LAB — TROPONIN I (HIGH SENSITIVITY)
Troponin I (High Sensitivity): 43 ng/L — ABNORMAL HIGH (ref <18)
Troponin I (High Sensitivity): 47 ng/L — ABNORMAL HIGH (ref <18)

## 2021-11-10 LAB — PREALBUMIN: Prealbumin: <5 mg/dL — ABNORMAL LOW (ref 18–38)

## 2021-11-10 LAB — MAGNESIUM: Magnesium: 2 mg/dL (ref 1.7–2.4)

## 2021-11-10 LAB — PROTIME-INR
INR: 1.5 — ABNORMAL HIGH (ref 0.8–1.2)
Prothrombin Time: 17.8 seconds — ABNORMAL HIGH (ref 11.4–15.2)

## 2021-11-10 LAB — PROCALCITONIN: Procalcitonin: 32.48 ng/mL

## 2021-11-10 MED ORDER — DEXTROSE 50 % IV SOLN: 50.0000 mL | Freq: Once | INTRAVENOUS | Status: AC

## 2021-11-10 MED ORDER — DEXTROSE 50 % IV SOLN
INTRAVENOUS | Status: AC
  Administered 2021-11-10: 50 mL via INTRAVENOUS
  Filled 2021-11-10: qty 50

## 2021-11-10 MED ORDER — DEXTROSE IN LACTATED RINGERS 5 % IV SOLN: INTRAVENOUS | Status: DC

## 2021-11-10 MED ORDER — SODIUM CHLORIDE 0.9% FLUSH
10.0000 mL | Freq: Two times a day (BID) | INTRAVENOUS | Status: DC
  Administered 2021-11-10 (×2): 30 mL
  Administered 2021-11-11 – 2021-11-29 (×22): 10 mL

## 2021-11-10 MED ORDER — MORPHINE SULFATE (PF) 2 MG/ML IV SOLN
2.0000 mg | INTRAVENOUS | Status: DC | PRN
Start: 2021-11-10 — End: 2021-11-18
  Administered 2021-11-11 – 2021-11-14 (×12): 2 mg via INTRAVENOUS
  Administered 2021-11-14: 4 mg via INTRAVENOUS
  Administered 2021-11-15 – 2021-11-18 (×3): 2 mg via INTRAVENOUS
  Filled 2021-11-10 (×11): qty 1
  Filled 2021-11-10: qty 2
  Filled 2021-11-10 (×4): qty 1

## 2021-11-10 MED ORDER — CEFTRIAXONE SODIUM 2 G IJ SOLR
2.0000 g | Freq: Every day | INTRAMUSCULAR | Status: DC
  Administered 2021-11-10 – 2021-11-11 (×2): 2 g via INTRAVENOUS
  Filled 2021-11-10 (×3): qty 20

## 2021-11-10 MED ORDER — SODIUM CHLORIDE 0.9 % IV SOLN
2.0000 g | Freq: Two times a day (BID) | INTRAVENOUS | Status: DC
  Administered 2021-11-10: 2 g via INTRAVENOUS
  Filled 2021-11-10: qty 2

## 2021-11-10 MED ORDER — ACETAMINOPHEN 10 MG/ML IV SOLN
1000.0000 mg | Freq: Four times a day (QID) | INTRAVENOUS | Status: AC
  Administered 2021-11-10 – 2021-11-11 (×4): 1000 mg via INTRAVENOUS
  Filled 2021-11-10 (×4): qty 100

## 2021-11-10 MED ORDER — SODIUM CHLORIDE 0.9% FLUSH: 10.0000 mL | INTRAVENOUS | Status: DC | PRN

## 2021-11-10 MED ORDER — THIAMINE HCL 100 MG/ML IJ SOLN
100.0000 mg | Freq: Every day | INTRAMUSCULAR | Status: DC
Start: 2021-11-10 — End: 2021-11-16
  Administered 2021-11-10 – 2021-11-16 (×7): 100 mg via INTRAVENOUS
  Filled 2021-11-10 (×7): qty 2

## 2021-11-10 NOTE — Consult Note (Signed)
[facility] Nurse ostomy consult note Stoma type/location: RLQ ileal conduit Stomal assessment/size: 1 and 1/8 inch round with 2 stents intact Peristomal assessment: intact, clear Treatment options for stomal/peristomal skin: skin barrier ring, convex pouch. Output: gold urine Ostomy pouching: 1pc. Convex urostomy pouch with skin barrier ring  Education provided: None. Caregiver in room; observes Enrolled patient in [place] program: No, accomplished during last admission  Pouch is Kellie Simmering # [phone], skin barrier ring [phone]  1 pouch, 1 skin barrier ring, stomal sizing guide in room for next pouch change.  [place] nursing team will follow, will remain available to this patient, the nursing and medical teams.   Thanks, Maudie Flakes, MSN, RN, [place], Arther Abbott  Pager# [phone]

## 2021-11-10 NOTE — Progress Notes (Addendum)
[facility] Progress Note     Subjective: CC: left lower quadrant wall pain.  Denies flatus in over 12 hours. States he was having daily BMs post-op until Saturday 10/5 when they stopped, then he had a small BM 11/9 and 11/10. Reports poor appetite and early satiety at home but no nausea or vomiting. Currently had NGT in place.  Objective: Vital signs in last 24 hours: Temp:  [93.9 F (34.4 C)-98.5 F (36.9 C)] 97.4 F (36.3 C) (11/11 0800) Pulse Rate:  [32-106] 105 (11/11 0830) Resp:  [9-23] 17 (11/11 0830) BP: (63-150)/(42-87) 108/63 (11/11 0830) SpO2:  [92 %-100 %] 93 % (11/11 0853) Weight:  [79.4 kg-83 kg] 81.3 kg (11/11 0453) Last BM Date: 11/01/21  Intake/Output from previous day: 11/10 0701 - 11/11 0700 In: 3965.6 [I.V.:1180.8; IV Piggyback:2784.8] Out: 2300 [Urine:2150; Emesis/NG output:150] Intake/Output this shift: Total I/O In: 274.2 [I.V.:178.2; IV Piggyback:96] Out: 250 [Urine:250]  PE: Gen:  Alert, NAD, ill appearing male Card:  Regular rate and rhythm, pitting lower extremity edema  Pulm:  Normal effort, clear to auscultation anteriorly bilaterally with diminished breath sounds bilateral lung bases  Abd: Soft, moderately distended, RLQ urostomy,+BS tinkering/high pitched, TTP around incision and left lower quadrant. There is mild blanching erythema of the left lower abdominal wall and flank.  Skin: warm and dry, no rashes or lesions Psych: A&Ox3   Lab Results:  Recent Labs    11/27/2021 1016 10/31/2021 1029 11/10/21 0514  WBC 7.0  --  12.5*  HGB 9.2* 9.5* 9.2*  HCT 28.2* 28.0* 28.1*  PLT 364  --  359   BMET Recent Labs    11/19/2021 1749 11/10/21 0500  NA 131* 134*  K 4.6 4.6  CL 101 100  CO2 16* 23  GLUCOSE 75 87  BUN 38* 35*  CREATININE 2.17* 1.67*  CALCIUM 7.3* 7.4*   PT/INR Recent Labs    11/05/2021 1016 11/10/21 0500  LABPROT 17.0* 17.8*  INR 1.4* 1.5*   CMP     Component Value Date/Time   NA 134 (L) 11/10/2021 0500    NA 137 09/21/2020 0942   K 4.6 11/10/2021 0500   CL 100 11/10/2021 0500   CO2 23 11/10/2021 0500   GLUCOSE 87 11/10/2021 0500   BUN 35 (H) 11/10/2021 0500   BUN 5 (L) 09/21/2020 0942   CREATININE 1.67 (H) 11/10/2021 0500   CREATININE 0.72 06/09/2021 1232   CREATININE 0.90 02/07/2017 1032   CALCIUM 7.4 (L) 11/10/2021 0500   PROT 5.4 (L) 11/18/2021 1016   PROT 7.2 03/10/2021 0845   ALBUMIN 2.1 (L) 11/18/2021 1016   ALBUMIN 4.4 03/10/2021 0845   AST 49 (H) 11/20/2021 1016   AST 22 06/09/2021 1232   ALT 21 11/10/2021 1016   ALT 10 06/09/2021 1232   ALKPHOS 57 11/11/2021 1016   BILITOT 1.0 11/27/2021 1016   BILITOT 0.4 06/09/2021 1232   GFRNONAA 41 (L) 11/10/2021 0500   GFRNONAA >60 06/09/2021 1232   GFRNONAA 84 02/07/2017 1032   GFRAA >60 09/23/2020 1133   GFRAA >89 02/07/2017 1032   Lipase  No results found for: LIPASE     Studies/Results: DG Abd 1 View  Result Date: 11/17/2021 CLINICAL DATA:  Suspected sepsis. Small-bowel obstruction seen on prior CT. EXAM: PORTABLE CHEST 1 VIEW COMPARISON:  Chest radiograph dated 11/21/2021 and CT dated 11/08/2021 FINDINGS: Right IJ central venous line with tip in the region of the cavoatrial junction. There is mild cardiomegaly with mild vascular congestion. Diffuse chronic  interstitial coarsening. No new consolidation, pleural effusion, or pneumothorax. Atherosclerotic calcification of the aorta. Enteric tube with tip in the body of the stomach. Persistent dilatation of small-bowel loops measuring up to approximately 6 cm in caliber. Bilateral ureteral stents noted. There is lumbar fusion. IMPRESSION: 1. Right IJ central venous line with tip in the region of the cavoatrial junction. No pneumothorax. 2. Enteric tube with tip in the body of the stomach. Persistent small bowel obstruction. Electronically Signed   By: Anner Crete M.D.   On: 10/31/2021 19:31   DG Chest Port 1 View  Result Date: 11/10/2021 CLINICAL DATA:  Suspected  abdominal sepsis EXAM: PORTABLE CHEST 1 VIEW COMPARISON:  Prior chest x-ray 11/12/2021 FINDINGS: Right IJ central venous catheter. Catheter tip overlies the right atrium. Gastric tube is present. The tip lies within the stomach. Stable cardiac and mediastinal contours. Atherosclerotic calcifications present in the transverse aorta. Inspiratory volumes are lower. Increasing perihilar streaky airspace opacities. Pulmonary vascular congestion bordering on mild edema. Small bilateral layering pleural effusions. No pneumothorax. No acute osseous abnormality. IMPRESSION: 1. Lower inspiratory volumes with increasing perihilar and bibasilar atelectasis. 2. Pulmonary vascular congestion with borderline interstitial edema. 3. Small bilateral layering pleural effusions. 4. Stable and satisfactory support apparatus. Electronically Signed   By: Jacqulynn Cadet M.D.   On: 11/10/2021 06:25   DG CHEST PORT 1 VIEW  Result Date: 11/07/2021 CLINICAL DATA:  Suspected sepsis. Small-bowel obstruction seen on prior CT. EXAM: PORTABLE CHEST 1 VIEW COMPARISON:  Chest radiograph dated 11/23/2021 and CT dated 11/08/2021 FINDINGS: Right IJ central venous line with tip in the region of the cavoatrial junction. There is mild cardiomegaly with mild vascular congestion. Diffuse chronic interstitial coarsening. No new consolidation, pleural effusion, or pneumothorax. Atherosclerotic calcification of the aorta. Enteric tube with tip in the body of the stomach. Persistent dilatation of small-bowel loops measuring up to approximately 6 cm in caliber. Bilateral ureteral stents noted. There is lumbar fusion. IMPRESSION: 1. Right IJ central venous line with tip in the region of the cavoatrial junction. No pneumothorax. 2. Enteric tube with tip in the body of the stomach. Persistent small bowel obstruction. Electronically Signed   By: Anner Crete M.D.   On: 11/23/2021 19:31   DG Chest Port 1 View  Result Date: 11/05/2021 CLINICAL DATA:   Questionable sepsis EXAM: PORTABLE CHEST 1 VIEW COMPARISON:  10/24/2021 FINDINGS: Stable interstitial coarsening with interstitial lung disease by June 2022 CT. There is no edema, consolidation, effusion, or pneumothorax. Normal heart size and mediastinal contours. IMPRESSION: Interstitial lung disease.  No acute superimposed finding. Electronically Signed   By: Jorje Guild M.D.   On: 11/26/2021 10:44   DG Abd Portable 1V  Result Date: 11/03/2021 CLINICAL DATA:  Encounter for nasogastric tube EXAM: PORTABLE ABDOMEN - 1 VIEW COMPARISON:  Same day CT FINDINGS: Nasogastric tube courses down the left mainstem/lower lobe bronchus. Multiple dilated loops of small bowel in the abdomen partially visualized. No acute osseous abnormality. Partially visualized lumbar spine fusion hardware. Partially visualized nephroureteral stents. IMPRESSION: Nasogastric tube is malpositioned within the left mainstem/lower lobe bronchus. Recommend removal. Small bowel obstruction. Critical Value/emergent results were called by telephone at the time of interpretation on 11/19/2021 at 3:29 pm to provider [facility] , who verbally acknowledged these results. Electronically Signed   By: Maurine Simmering M.D.   On: 11/22/2021 15:30   CT CHEST ABDOMEN PELVIS WO CONTRAST  Result Date: 11/10/2021 CLINICAL DATA:  Hypotension, shock EXAM: CT CHEST, ABDOMEN AND PELVIS WITHOUT CONTRAST  TECHNIQUE: Multidetector CT imaging of the chest, abdomen and pelvis was performed following the standard protocol without IV contrast. COMPARISON:  CT abdomen and pelvis 06/09/2021 FINDINGS: CT CHEST FINDINGS Cardiovascular: Heart size is normal. No pericardial effusion identified. Coronary artery calcifications noted. Main pulmonary artery is normal caliber. Note is made of a small amount of air in the brachiocephalic vein. Thoracic aorta is normal in caliber with mild calcified plaques. Mediastinum/Nodes: No bulky axillary, mediastinal or hilar  lymphadenopathy identified. Lungs/Pleura: Lungs are hyperinflated with mild emphysematous changes. There is mild interlobular septal thickening which is new since previous study. Dependent subsegmental atelectatic changes bilaterally. No focal consolidation visualized. No significant pleural effusion. No pneumothorax. Musculoskeletal: No suspicious bony lesions identified in the chest. Small foci of subcutaneous or vascular air identified in the right upper chest region as well as in the subcutaneous plane in the lower chest extending to the abdomen. CT ABDOMEN PELVIS FINDINGS Hepatobiliary: No focal liver abnormality is seen. No gallstones, gallbladder wall thickening, or biliary dilatation. Pancreas: Unremarkable. No pancreatic ductal dilatation or surrounding inflammatory changes. Spleen: Normal in size without focal abnormality. Adrenals/Urinary Tract: Adrenal glands appear normal. Recent cystoprostatectomy surgical changes. No nephrolithiasis or hydronephrosis identified bilaterally. Bilateral ureteral stents which terminate distally through an ostomy in the right abdomen. Stomach/Bowel: Limited evaluation of bowel due to lack of contrast. There are multiple loops of distended small bowel with air-fluid levels, mostly in the left abdomen which measure up to 4.9 cm in diameter. Accompanying mesenteric engorgement/fat stranding. No pneumatosis identified. Distal loops of ileum are decompressed. Postsurgical changes in the right lower quadrant. Multiple foci of extraluminal air visualized in the right pelvis and a few in the upper abdomen. Retained fecal material in the colon and rectum. Vascular/Lymphatic: Aortic atherosclerosis. No enlarged abdominal or pelvic lymph nodes. Reproductive: Prostate gland surgically absent. Other: Subcutaneous emphysema identified throughout the anterior abdomen, pelvis and anterior aspect of the visualized proximal lower extremities. No associated defined fluid collections. Small  to moderate volume ascites mostly in the pelvis and in the left abdomen Musculoskeletal: Degenerative and postsurgical changes of the lumbar spine. No suspicious bony lesions visualized. IMPRESSION: 1. Findings concerning for small bowel obstruction, with proximal loops of small bowel measuring up to 4.9 cm in diameter with air-fluid levels. Possible transition point in the lower abdomen/pelvis near a surgical anastomosis. 2. Small to moderate volume ascites mostly located in the pelvis and left abdomen. 3. Small amount of free intraperitoneal air, mostly in the right pelvis surrounded by ascites. May be postoperative in nature, correlate clinically and follow-up recommended. 4. Small to moderate amount of subcutaneous emphysema mostly in the anterior abdominal and pelvic walls. No accompanying defined fluid collections or significant adjacent inflammatory fat stranding. 5. Cystoprostatectomy changes with right-sided urinary ostomy. Bilateral ureteral stents extending through the ostomy. No hydronephrosis. 6. Mild interstitial pulmonary edema. Emphysematous and subsegmental atelectatic changes. Discussed with Dr. Regenia Skeeter over the telephone at 1:09 p.m. on 11/11/2021 with read back. Electronically Signed   By: Ofilia Neas M.D.   On: 11/06/2021 13:26    Anti-infectives: Anti-infectives (From admission, onward)    Start     Dose/Rate Route Frequency Ordered Stop   11/10/21 0800  ceFEPIme (MAXIPIME) 2 g in sodium chloride 0.9 % 100 mL IVPB        2 g 200 mL/hr over 30 Minutes Intravenous Every 12 hours 11/10/21 0618     11/10/21 0630  ceFEPIme (MAXIPIME) 2 g in sodium chloride 0.9 % 100 mL IVPB  Status:  Discontinued        2 g 200 mL/hr over 30 Minutes Intravenous Every 24 hours 11/04/2021 1622 11/10/21 0618   11/14/2021 1624  vancomycin variable dose per unstable renal function (pharmacist dosing)  Status:  Discontinued         Does not apply See admin instructions 11/25/2021 1624 11/10/21 0613    11/23/2021 1030  ceFEPIme (MAXIPIME) 2 g in sodium chloride 0.9 % 100 mL IVPB        2 g 200 mL/hr over 30 Minutes Intravenous  Once 11/28/2021 1016 11/15/2021 1133   11/14/2021 1030  metroNIDAZOLE (FLAGYL) IVPB 500 mg        500 mg 100 mL/hr over 60 Minutes Intravenous  Once 11/03/2021 1016 11/21/2021 1915   11/06/2021 1030  vancomycin (VANCOCIN) IVPB 1000 mg/200 mL premix  Status:  Discontinued        1,000 mg 200 mL/hr over 60 Minutes Intravenous  Once 11/04/2021 1016 11/04/2021 1023   11/05/2021 1030  vancomycin (VANCOREADY) IVPB 1500 mg/300 mL        1,500 mg 150 mL/hr over 120 Minutes Intravenous  Once 11/10/2021 1023 11/04/2021 1341        Assessment/Plan SBO  - s/p Robotic-assisted laparoscopic radical cystectomy, radical prostatectomy, Bilateral pelvic lymph node dissection, Extensive laparoscopic adhesiolysis, Small bowel resection, Ileal conduit urinary diversion 10/26/21 [name] - CT scan with SBO pattern, possibly a pSBO at the level of the surgical anastomosis  - continue NG tube, bowel rest, and await return of bowel function - patient may benefit from small bowel protocol with gastrografin - this would also rule out a leak from the small bowel anastomosis, even though my suspicion for a leak based on the CT scan is very low. Will discuss with my attending prior to ordering. - monitor left flank erythema - PT/OT   Small volume pneumoperitoneum - reviewed with Dr. Johney Maine and [name] 11/10, suspect this is related to insufflation/post-operative state.  ID - maxipime/ flagyl/ vancomycin 11/10, switched to Rocephin based on urine cx per primary team VTE - SCDs, per primary FEN - NPO, NGT, IVF Foley - none. Urostomy/Ileal conduit   Septic shock - Klebsiella Oxytoca UTI  Asthma/ COPD Chronic back pain HLD AKI Possible UTI  LOS: 1 day    Obie Dredge, [facility] Please see Amion for pager number during day hours 7:00am-4:30pm

## 2021-11-10 NOTE — Progress Notes (Signed)
  Echocardiogram 2D Echocardiogram has been performed.  William [name] 11/10/2021, 11:52 AM

## 2021-11-10 NOTE — Progress Notes (Signed)
Initial Nutrition Assessment  DOCUMENTATION CODES:   Not applicable  INTERVENTION:  - diet advancement as medically feasible. - will monitor for potential needs for TPN.    NUTRITION DIAGNOSIS:   Inadequate oral intake related to inability to eat as evidenced by NPO status.  GOAL:   Patient will meet greater than or equal to 90% of their needs  MONITOR:   Diet advancement, Labs, Weight trends, I & O's  REASON FOR ASSESSMENT:   Malnutrition Screening Tool  ASSESSMENT:   80 year-old male with medical history of dyslipidemia, COVID-19, bladder cancer s/p laparoscopic radical cystectomy, robotic radical prostatectomy, bilateral pelvic lymph node dissection, extensive LOAs, SBR, and ileal conduit urinary diversion on 10/26 in the setting of invasive bladder cancer, on chemo. He was seen in the Urology Clinic on 11/10 and found to have BP in the 37H systolic.  Patient discussed in rounds this AM and with RN after RD visit to his room. He has been NPO since admission. NGT placed in R nare yesterday at 1915 (gastric); ~150 ml very dark output in canister  No visitors present at the time of RD visit. Patient reports that abdomen/stomach feels terrible. He shares that he has had a very poor appetite with very minimal PO intake since surgery on 10/26. He states that he was also having swallowing difficulty since surgery, feeling like he was or was going to choke on items he took in. He last ate a small amount on 11/9 but cannot recall what he ate.   He states that last chemo was on 06/19/21 and that he has had a decreased appetite since that time as items continue to have altered and unappealing taste.   Weight today is 179 lb and weight has been mainly stable since 03/21/21 but noted extent of edema; will monitor closely as improvement in edema will lead to change in weight.   Surgery and Urology following. CT consistent with SBO. Possible plan for gastrografin. Noted to be in septic shock  on admission d/t UTI.    Labs reviewed; CBGs: 87, 91, 87 mg/dl, Na: 134 mmol/l, BUN: 35 mg/dl, creatinine: 1.67 mg/dl, Ca: 7.4 mg/dl, GFR: 41 ml/min.  Medications reviewed. IVF; D5-LR @ 75 ml/hr (306 kcal/24 hrs).   NUTRITION - FOCUSED PHYSICAL EXAM:  Completed; no muscle or fat depletions, deep pitting edema to BLE; RN documentation states moderate edema to perineum and scrotum  Diet Order:   Diet Order             Diet NPO time specified Except for: Ice Chips  Diet effective now                   EDUCATION NEEDS:   No education needs have been identified at this time  Skin:  Skin Assessment: Skin Integrity Issues: Skin Integrity Issues:: Incisions Incisions: abdomen (10/26)  Last BM:  PTA/unknown  Height:   Ht Readings from Last 1 Encounters:  11/20/2021 5\' 7"  (1.702 m)    Weight:   Wt Readings from Last 1 Encounters:  11/10/21 81.3 kg    Estimated Nutritional Needs:  Kcal:  2030-2275 kcal Protein:  100-115 grams Fluid:  >/= 2.2 L/day      Jarome Matin, MS, RD, LDN, CNSC Inpatient Clinical Dietitian RD pager # available in [place]  After hours/weekend pager # available in [facility]

## 2021-11-10 NOTE — Progress Notes (Addendum)
PHARMACY - PHYSICIAN COMMUNICATION CRITICAL VALUE ALERT - BLOOD CULTURE IDENTIFICATION (BCID)  William [name] is an 80 y.o. male who presented to [facility] on 11/14/2021 with a chief complaint of hypotension.   Assessment:  Patient presents with sepsis likely from urinary source.  PMH significant for a history of bladder cancer with recent cystectomy and ileal conduit on 67/89 with a complicated post-op course.   Blood cx growing GNR in all 3 bottles; BCID + klebsiella oxytoca, no resistance found.  AKI noted- improving.  Name of physician (or Provider) Contacted: Ogan  Current antibiotics: Cefepime + Vancomycin  Changes to prescribed antibiotics recommended:  Discontinue Vancomycin Consider de-escalate Cefepime to Rocephin 2gm IV q24h  Results for orders placed or performed during the hospital encounter of 11/07/2021  Blood Culture ID Panel (Reflexed) (Collected: 11/27/2021 10:16 AM)  Result Value Ref Range   Enterococcus faecalis NOT DETECTED NOT DETECTED   Enterococcus Faecium NOT DETECTED NOT DETECTED   Listeria monocytogenes NOT DETECTED NOT DETECTED   Staphylococcus species NOT DETECTED NOT DETECTED   Staphylococcus aureus (BCID) NOT DETECTED NOT DETECTED   Staphylococcus epidermidis NOT DETECTED NOT DETECTED   Staphylococcus lugdunensis NOT DETECTED NOT DETECTED   Streptococcus species NOT DETECTED NOT DETECTED   Streptococcus agalactiae NOT DETECTED NOT DETECTED   Streptococcus pneumoniae NOT DETECTED NOT DETECTED   Streptococcus pyogenes NOT DETECTED NOT DETECTED   A.calcoaceticus-baumannii NOT DETECTED NOT DETECTED   Bacteroides fragilis NOT DETECTED NOT DETECTED   Enterobacterales DETECTED (A) NOT DETECTED   Enterobacter cloacae complex NOT DETECTED NOT DETECTED   Escherichia coli NOT DETECTED NOT DETECTED   Klebsiella aerogenes NOT DETECTED NOT DETECTED   Klebsiella oxytoca DETECTED (A) NOT DETECTED   Klebsiella pneumoniae NOT DETECTED NOT DETECTED   Proteus species  NOT DETECTED NOT DETECTED   Salmonella species NOT DETECTED NOT DETECTED   Serratia marcescens NOT DETECTED NOT DETECTED   Haemophilus influenzae NOT DETECTED NOT DETECTED   Neisseria meningitidis NOT DETECTED NOT DETECTED   Pseudomonas aeruginosa NOT DETECTED NOT DETECTED   Stenotrophomonas maltophilia NOT DETECTED NOT DETECTED   Candida albicans NOT DETECTED NOT DETECTED   Candida auris NOT DETECTED NOT DETECTED   Candida glabrata NOT DETECTED NOT DETECTED   Candida krusei NOT DETECTED NOT DETECTED   Candida parapsilosis NOT DETECTED NOT DETECTED   Candida tropicalis NOT DETECTED NOT DETECTED   Cryptococcus neoformans/gattii NOT DETECTED NOT DETECTED   CTX-M ESBL NOT DETECTED NOT DETECTED   Carbapenem resistance IMP NOT DETECTED NOT DETECTED   Carbapenem resistance KPC NOT DETECTED NOT DETECTED   Carbapenem resistance NDM NOT DETECTED NOT DETECTED   Carbapenem resist OXA 48 LIKE NOT DETECTED NOT DETECTED   Carbapenem resistance VIM NOT DETECTED NOT DETECTED    Netta Cedars PharmD 11/10/2021  5:50 AM  Addendum: Continue Cefepime for now-->increase frequency to q12h since renal function improved.  F/U with rounding MD, consider de-escalate to Rocephin once clinically improved.  Netta Cedars, PharmD, BCPS 11/10/2021@6 :22 AM

## 2021-11-10 NOTE — Progress Notes (Signed)
Patient ID: William [name], male   DOB: 20-Mar-1941, 80 y.o.   MRN: [id number] HPI:    1 - Bladder Cancer - s/p cystectomy with lysis of adhesions and small bowel resection 10/25/2021 for stage 2 bladder cancer with negative nodes and margins, Had neoadjuvant chemo prior.    2 - Urinary Diversion - s/p conduit diversion at cystectomy 09/2021. Stents in good position by ER CT this admission.    3 - Sepsis, Small Bowel Obstruction - tachycardia, chills, lactic acidosis c/w major sepsis at ER eval 11/10. CT with expected subcutaneous gas (from insuflattion at procedure), some pelvic fluid. NO significant inraperitoneal air, stents in good position. Some dilated small bowel. Has been tolerating PO as of today and with persistent bowel function.    Pt is stable but still complaining of LLQ pain.  He c/o of "chest pain" but when questioned states he "feels the need to cough and move phlegm but can't".  Blood and urine cultures growing Klebsiella Oxytoca with sensitivities pending. BP improved today (off vasopressors) and he remains AF. NGT tube in place. Cr 1.67 and Hg 9.2.       Past Medical History:  Diagnosis Date   Bladder cancer ([place])      Pt says small tumor not cancer   Bleeding gums 11/11/2015   COVID 09/23/2020    back ache hurt all over x few days all symptoms reolved   Dyslipidemia (high LDL; low HDL)     Low back pain 12/27/2014   Medial epicondylitis of right elbow 12/13/2014   Numbness and tingling of right leg     Tinnitus      all the time   Toenail fungus      2 big toes   Wears glasses             Past Surgical History:  Procedure Laterality Date   bone chip removed from right heel   [place] Bilateral 02/08/2021    Procedure: CYSTOSCOPY WITH RETROGRADE PYELOGRAM;  Surgeon: [name], MD;  Location: [facility];  Service: Urology;  Laterality: Bilateral;   CYSTOSCOPY WITH INJECTION N/A 10/25/2021    Procedure: CYSTOSCOPY WITH  INJECTION OF INDOCYANINE GREEN DYE;  Surgeon: [name], MD;  Location: [facility];  Service: Urology;  Laterality: N/A;   FINGER AMPUTATION        injured by table saw, left 4th and 5th fingers   IR IMAGING GUIDED PORT INSERTION   03/07/2021   IR REMOVAL TUN ACCESS W/ PORT W/O FL MOD SED   06/29/2021   KNEE ARTHROSCOPY W/ MENISCAL REPAIR   2012 2014    left   KNEE ARTHROSCOPY W/ MENISCAL REPAIR   11/2011    left   LACERATION REPAIR   1962    forehead   LAPAROSCOPIC LYSIS OF ADHESIONS N/A 10/25/2021    Procedure: EXTENSIVE LAPAROSCOPIC LYSIS OF ADHESIONS;  Surgeon: [name], MD;  Location: [facility];  Service: Urology;  Laterality: N/A;   LUMBAR DISC SURGERY   09/2007    bone spurs, stenosis, L3-4, 4-5    LYMPH NODE DISSECTION Bilateral 10/25/2021    Procedure: LYMPH NODE DISSECTION;  Surgeon: [name], MD;  Location: [facility];  Service: Urology;  Laterality: Bilateral;   POSTERIOR LUMBAR FUSION 4 LEVEL N/A 10/13/2013    Procedure: Lumbar Two-Three, Lumbar Three-Four, Lumbar Four-Five, Lumbar Five-Sacral One posterior lumbar interbody fusion with interbody prosthesis posterior lateral arthrodesis and posterior segmental instrumentation;  Surgeon:  [name], MD;  Location: [facility];  Service: Neurosurgery;  Laterality: N/A;  POSTERIOR LUMBAR FUSION 4 LEVEL   ROBOT ASSISTED LAPAROSCOPIC COMPLETE CYSTECT ILEAL CONDUIT N/A 10/25/2021    Procedure: XI ROBOTIC ASSISTED LAPAROSCOPIC COMPLETE CYSTECT ILEAL CONDUIT/RADICAL PROSTATECTOMY AND SMALL BOWEL RESECTION;  Surgeon: [name], MD;  Location: [facility];  Service: Urology;  Laterality: N/A;  6 HRS   SKIN GRAFT        from right thigh to left forehead   SMALL INTESTINE SURGERY   5449    ileitis complications   TRANSURETHRAL RESECTION OF BLADDER TUMOR N/A 02/08/2021    Procedure: TRANSURETHRAL RESECTION OF BLADDER TUMOR (TURBT);  Surgeon: [name], MD;  Location[facility];  Service: Urology;   Laterality: N/A;           Family History  Problem Relation Age of Onset   Stroke Mother     Heart disease Father     Stroke Father     Diabetes Father     Obesity Brother     Obesity Brother     Cancer Maternal Grandfather     Cancer Maternal Grandmother     Drug abuse Daughter        Social History:  reports that he quit smoking about 35 years ago. His smoking use included cigarettes. He has a 45.00 pack-year smoking history. He has never used smokeless tobacco. He reports that he does not currently use alcohol. He reports that he does not use drugs.   Allergies:       Allergies  Allergen Reactions   Hydrocodone Other (See Comments)      Causes constipation even with stool softner     . Current Meds  Medication Sig   acetaminophen (TYLENOL) 500 MG tablet Take 1,000 mg by mouth every 4 (four) hours as needed for mild pain or fever.   fluticasone (FLONASE) 50 MCG/ACT nasal spray Place 2 sprays into both nostrils daily.   gabapentin (NEURONTIN) 300 MG capsule TAKE 3 CAPSULES BY MOUTH AT BEDTIME (Patient taking differently: Take 900 mg by mouth at bedtime.)   ibuprofen (ADVIL) 200 MG tablet Take 600 mg by mouth every 4 (four) hours as needed for mild pain.   ondansetron (ZOFRAN-ODT) 8 MG disintegrating tablet Take 8 mg by mouth every 8 (eight) hours as needed for nausea/vomiting.   oxyCODONE-acetaminophen (PERCOCET) 5-325 MG tablet Take 1 tablet by mouth every 6 (six) hours as needed for severe pain.   SPIRIVA HANDIHALER 18 MCG inhalation capsule PLACE 1 CAPSULE( 18 MCG TOTAL) INTO INHALER AND INHALE DAILY (Patient taking differently: Place 18 mcg into inhaler and inhale daily.)   tiZANidine (ZANAFLEX) 4 MG tablet Take 4 mg by mouth at bedtime.     . CBC Latest Ref Rng & Units 11/10/2021 11/21/2021 11/08/2021  WBC 4.0 - 10.5 K/uL 12.5(H) - 7.0  Hemoglobin 13.0 - 17.0 g/dL 9.2(L) 9.5(L) 9.2(L)  Hematocrit 39.0 - 52.0 % 28.1(L) 28.0(L) 28.2(L)  Platelets 150 - 400 K/uL 359 -  364   . Lab Results  Component Value Date   NA 134 (L) 11/10/2021   K 4.6 11/10/2021   CO2 23 11/10/2021   GLUCOSE 87 11/10/2021   BUN 35 (H) 11/10/2021   CREATININE 1.67 (H) 11/10/2021   CALCIUM 7.4 (L) 11/10/2021   GFRNONAA 41 (L) 11/10/2021     Final result   Visible to patient: Yes (not seen)   Next appt: 12/06/2021 at 08:45 AM in Oncology (  [facility])   0 Result Notes Component Ref Range & Units 1 d ago   Enterococcus faecalis NOT DETECTED NOT DETECTED   Enterococcus Faecium NOT DETECTED NOT DETECTED   Listeria monocytogenes NOT DETECTED NOT DETECTED   Staphylococcus species NOT DETECTED NOT DETECTED   Staphylococcus aureus (BCID) NOT DETECTED NOT DETECTED   Staphylococcus epidermidis NOT DETECTED NOT DETECTED   Staphylococcus lugdunensis NOT DETECTED NOT DETECTED   Streptococcus species NOT DETECTED NOT DETECTED   Streptococcus agalactiae NOT DETECTED NOT DETECTED   Streptococcus pneumoniae NOT DETECTED NOT DETECTED   Streptococcus pyogenes NOT DETECTED NOT DETECTED   A.calcoaceticus-baumannii NOT DETECTED NOT DETECTED   Bacteroides fragilis NOT DETECTED NOT DETECTED   Enterobacterales NOT DETECTED DETECTED Abnormal    Comment: Enterobacterales represent a large order of gram negative bacteria, not a single organism.  CRITICAL RESULT CALLED TO, READ BACK BY AND VERIFIED WITH:  M LILLISTON,PHARMD@[id number]  11/10/21 [place]   Enterobacter cloacae complex NOT DETECTED NOT DETECTED   Escherichia coli NOT DETECTED NOT DETECTED   Klebsiella aerogenes NOT DETECTED NOT DETECTED   Klebsiella oxytoca NOT DETECTED DETECTED Abnormal    Comment: CRITICAL RESULT CALLED TO, READ BACK BY AND VERIFIED WITH:  M LILLISTON,PHARMD@[id number]  11/10/21 [place]   Klebsiella pneumoniae NOT DETECTED NOT DETECTED   Proteus species NOT DETECTED NOT DETECTED   Salmonella species NOT DETECTED NOT DETECTED   Serratia marcescens NOT DETECTED NOT DETECTED   Haemophilus influenzae NOT DETECTED NOT DETECTED    Neisseria meningitidis NOT DETECTED NOT DETECTED   Pseudomonas aeruginosa NOT DETECTED NOT DETECTED   Stenotrophomonas maltophilia NOT DETECTED NOT DETECTED   Candida albicans NOT DETECTED NOT DETECTED   Candida auris NOT DETECTED NOT DETECTED   Candida glabrata NOT DETECTED NOT DETECTED   Candida krusei NOT DETECTED NOT DETECTED   Candida parapsilosis NOT DETECTED NOT DETECTED   Candida tropicalis NOT DETECTED NOT DETECTED   Cryptococcus neoformans/gattii NOT DETECTED NOT DETECTED   CTX-M ESBL NOT DETECTED NOT DETECTED   Carbapenem resistance IMP NOT DETECTED NOT DETECTED   Carbapenem resistance KPC NOT DETECTED NOT DETECTED   Carbapenem resistance NDM NOT DETECTED NOT DETECTED   Carbapenem resist OXA 48 LIKE NOT DETECTED NOT DETECTED   Carbapenem resistance VIM NOT DETECTED NOT DETECTED   Comment: Performed at [facility], [place] [address] [place], [place] [address]  Resulting Agency  [facility]         Specimen Collected: 11/07/2021 10:16 Last Resulted: 11/10/21 05:46       Specimen Description IN/OUT CATH URINE  Performed at [facility] [address], [place], [place] [address]   Special Requests NONE  Performed at [facility], [place] [address], [place], [place] [address]   Culture  Abnormal  >=100,000 COLONIES/mL KLEBSIELLA OXYTOCA  CULTURE REINCUBATED FOR BETTER GROWTH  SUSCEPTIBILITIES TO FOLLOW  Performed at [facility] [address] [place], [place] [address]       Imaging Results (Last 48 hours)  DG Chest Port 1 View   Result Date: 11/08/2021 CLINICAL DATA:  Questionable sepsis EXAM: PORTABLE CHEST 1 VIEW COMPARISON:  10/24/2021 FINDINGS: Stable interstitial coarsening with interstitial lung disease by June 2022 CT. There is no edema, consolidation, effusion, or pneumothorax. Normal heart size and mediastinal contours. IMPRESSION: Interstitial lung disease.  No acute superimposed finding. Electronically  Signed   By: Jorje Guild M.D.   On: 11/18/2021 10:44    CT CHEST ABDOMEN PELVIS WO CONTRAST  Result Date: 11/11/2021 CLINICAL DATA:  Hypotension, shock EXAM: CT CHEST, ABDOMEN AND PELVIS WITHOUT CONTRAST TECHNIQUE: Multidetector CT imaging of the chest, abdomen and pelvis was performed following the standard protocol without IV contrast. COMPARISON:  CT abdomen and pelvis 06/09/2021 FINDINGS: CT CHEST FINDINGS Cardiovascular: Heart size is normal. No pericardial effusion identified. Coronary artery calcifications noted. Main pulmonary artery is normal caliber. Note is made of a small amount of air in the brachiocephalic vein. Thoracic aorta is normal in caliber with mild calcified plaques. Mediastinum/Nodes: No bulky axillary, mediastinal or hilar lymphadenopathy identified. Lungs/Pleura: Lungs are hyperinflated with mild emphysematous changes. There is mild interlobular septal thickening which is new since previous study. Dependent subsegmental atelectatic changes bilaterally. No focal consolidation visualized. No significant pleural effusion. No pneumothorax. Musculoskeletal: No suspicious bony lesions identified in the chest. Small foci of subcutaneous or vascular air identified in the right upper chest region as well as in the subcutaneous plane in the lower chest extending to the abdomen. CT ABDOMEN PELVIS FINDINGS Hepatobiliary: No focal liver abnormality is seen. No gallstones, gallbladder wall thickening, or biliary dilatation. Pancreas: Unremarkable. No pancreatic ductal dilatation or surrounding inflammatory changes. Spleen: Normal in size without focal abnormality. Adrenals/Urinary Tract: Adrenal glands appear normal. Recent cystoprostatectomy surgical changes. No nephrolithiasis or hydronephrosis identified bilaterally. Bilateral ureteral stents which terminate distally through an ostomy in the right abdomen. Stomach/Bowel: Limited evaluation of bowel due to lack of contrast. There are multiple  loops of distended small bowel with air-fluid levels, mostly in the left abdomen which measure up to 4.9 cm in diameter. Accompanying mesenteric engorgement/fat stranding. No pneumatosis identified. Distal loops of ileum are decompressed. Postsurgical changes in the right lower quadrant. Multiple foci of extraluminal air visualized in the right pelvis and a few in the upper abdomen. Retained fecal material in the colon and rectum. Vascular/Lymphatic: Aortic atherosclerosis. No enlarged abdominal or pelvic lymph nodes. Reproductive: Prostate gland surgically absent. Other: Subcutaneous emphysema identified throughout the anterior abdomen, pelvis and anterior aspect of the visualized proximal lower extremities. No associated defined fluid collections. Small to moderate volume ascites mostly in the pelvis and in the left abdomen Musculoskeletal: Degenerative and postsurgical changes of the lumbar spine. No suspicious bony lesions visualized. IMPRESSION: 1. Findings concerning for small bowel obstruction, with proximal loops of small bowel measuring up to 4.9 cm in diameter with air-fluid levels. Possible transition point in the lower abdomen/pelvis near a surgical anastomosis. 2. Small to moderate volume ascites mostly located in the pelvis and left abdomen. 3. Small amount of free intraperitoneal air, mostly in the right pelvis surrounded by ascites. May be postoperative in nature, correlate clinically and follow-up recommended. 4. Small to moderate amount of subcutaneous emphysema mostly in the anterior abdominal and pelvic walls. No accompanying defined fluid collections or significant adjacent inflammatory fat stranding. 5. Cystoprostatectomy changes with right-sided urinary ostomy. Bilateral ureteral stents extending through the ostomy. No hydronephrosis. 6. Mild interstitial pulmonary edema. Emphysematous and subsegmental atelectatic changes. Discussed with Dr. Regenia Skeeter over the telephone at 1:09 p.m. on  11/11/2021 with read back. Electronically Signed   By: Ofilia Neas M.D.   On: 11/16/2021 13:26       Review of Systems  Constitutional:  Positive for fatigue.  Gastrointestinal:  Positive for LLQ pain   Physical Exam Vitals reviewed.  Constitutional:      Comments: Ill appearing, A/O. Wife at bedside. NGT in place with dark green/black outpt   HENT:     Head: Normocephalic.  Cardiovascular:  Rate and Rhythm: Tachycardia present.  Abdominal:     General: Abdomen is flat.     Comments: RLQ Urostomy pink and patet with Rt (red) and Lt (blue) stents and copious urine and scant mucus.   Midline incision c/d/I.  NO wound drainage.   Tender in LLQ Psychiatric:        Mood and Affect: Mood normal.      Assessment/Plan:  Sepsis due to K. Oxtytoca--sensitivities pending.  Currently on IV Rocephin and Vancomycin. Review sensitivities when available and adjust meds as necessary.   AKI--Cr improving  SBO--NGT and bowel rest per Gen Surg. He has long h/o recurrent bowel obstruction and is not simple surgical candidate. Do not recommend any sort of abdominal exploration given relatively simple fluid and no free intraperitoneal air.   PT/OT   Routine ostomy care  Greatly appreciate Gen Surg and Medical teams care.

## 2021-11-10 NOTE — Progress Notes (Signed)
[facility] Progress Note Patient Name: [name] DOB: 1941/08/23 MRN: [id number]   Date of Service  11/10/2021  HPI/Events of Note  Blood glucose running low (last check was 54 mg / dl, he is on LR gtt.  eICU Interventions  D 5 % LR gtt substituted for LR gtt, and Q 4 hourly CBG ordered.        Kerry Kass Kamaiyah Uselton 11/10/2021, 12:28 AM

## 2021-11-10 NOTE — Progress Notes (Signed)
Assessed patient at bedside for reports of chest pain.  Patient reports he has been coughing and feels as if phlegm is stuck in his throat.  Denies pain in his arm, shoulder blades / neck.  No change in hemodynamics, actually improved as he is off vasopressors.    Plan: -EKG reviewed, no changes  -assess troponin x1 given sepsis / coronary disease noted on CT, at risk coronary event given acute illness  -wife updated at bedside on culture findings     William Gens, MSN, APRN, NP-C, AGACNP-BC [facility] 11/10/2021, 11:41 AM   Please see [email or web address] for pager details.   From 7A-7P if no response, please call [phone] After hours, please call ELink [phone]

## 2021-11-10 NOTE — Progress Notes (Signed)
Reason for Consult:Bladder Cancer, Urinary Diversion, Sepsis. S/p - s/p cystectomy with lysis of adhesions and small bowel resection 10/25/2021   Interval: -Patient sleepy this morning, feels slightly improved compared to yesterday -NGT placed for bowel rest with 150cc output  -Robust urine output with resuscitation 1.8L overnight -Blood cultures positive- Klebsiella   Objective: Vital signs in last 24 hours: Temp:  [93.9 F (34.4 C)-98.5 F (36.9 C)] 97.4 F (36.3 C) (11/11 0800) Pulse Rate:  [32-106] 105 (11/11 0830) Resp:  [9-23] 17 (11/11 0830) BP: (63-150)/(42-87) 108/63 (11/11 0830) SpO2:  [92 %-100 %] 93 % (11/11 0853) Weight:  [81.3 kg-83 kg] 81.3 kg (11/11 0453)  Intake/Output from previous day: 11/10 0701 - 11/11 0700 In: 3965.6 [I.V.:1180.8; IV Piggyback:2784.8] Out: 2300 [Urine:2150; Emesis/NG output:150] Intake/Output this shift: Total I/O In: 274.2 [I.V.:178.2; IV Piggyback:96] Out: 250 [Urine:250]  Physical Exam:  General: sleepy but resting comfortably  CV: RRR Lungs: Clear Abdomen: Moderately distended and firm, minimal tenderness. NGT in place. RLQ urostomy with clear yellow urine output.  Incisions: c/d/I  Ext: Bilateral lower extremity edema   Lab Results: Recent Labs    11/27/2021 1016 11/27/2021 1029 11/10/21 0514  HGB 9.2* 9.5* 9.2*  HCT 28.2* 28.0* 28.1*   BMET Recent Labs    11/19/2021 1749 11/10/21 0500  NA 131* 134*  K 4.6 4.6  CL 101 100  CO2 16* 23  GLUCOSE 75 87  BUN 38* 35*  CREATININE 2.17* 1.67*  CALCIUM 7.3* 7.4*     Studies/Results: DG Abd 1 View  Result Date: 11/17/2021 CLINICAL DATA:  Suspected sepsis. Small-bowel obstruction seen on prior CT. EXAM: PORTABLE CHEST 1 VIEW COMPARISON:  Chest radiograph dated 11/01/2021 and CT dated 11/21/2021 FINDINGS: Right IJ central venous line with tip in the region of the cavoatrial junction. There is mild cardiomegaly with mild vascular congestion. Diffuse chronic interstitial  coarsening. No new consolidation, pleural effusion, or pneumothorax. Atherosclerotic calcification of the aorta. Enteric tube with tip in the body of the stomach. Persistent dilatation of small-bowel loops measuring up to approximately 6 cm in caliber. Bilateral ureteral stents noted. There is lumbar fusion. IMPRESSION: 1. Right IJ central venous line with tip in the region of the cavoatrial junction. No pneumothorax. 2. Enteric tube with tip in the body of the stomach. Persistent small bowel obstruction. Electronically Signed   By: Anner Crete M.D.   On: 11/06/2021 19:31   DG Chest Port 1 View  Result Date: 11/10/2021 CLINICAL DATA:  Suspected abdominal sepsis EXAM: PORTABLE CHEST 1 VIEW COMPARISON:  Prior chest x-ray 11/24/2021 FINDINGS: Right IJ central venous catheter. Catheter tip overlies the right atrium. Gastric tube is present. The tip lies within the stomach. Stable cardiac and mediastinal contours. Atherosclerotic calcifications present in the transverse aorta. Inspiratory volumes are lower. Increasing perihilar streaky airspace opacities. Pulmonary vascular congestion bordering on mild edema. Small bilateral layering pleural effusions. No pneumothorax. No acute osseous abnormality. IMPRESSION: 1. Lower inspiratory volumes with increasing perihilar and bibasilar atelectasis. 2. Pulmonary vascular congestion with borderline interstitial edema. 3. Small bilateral layering pleural effusions. 4. Stable and satisfactory support apparatus. Electronically Signed   By: Jacqulynn Cadet M.D.   On: 11/10/2021 06:25   DG CHEST PORT 1 VIEW  Result Date: 11/02/2021 CLINICAL DATA:  Suspected sepsis. Small-bowel obstruction seen on prior CT. EXAM: PORTABLE CHEST 1 VIEW COMPARISON:  Chest radiograph dated 11/03/2021 and CT dated 11/13/2021 FINDINGS: Right IJ central venous line with tip in the region of the cavoatrial junction. There is  mild cardiomegaly with mild vascular congestion. Diffuse chronic  interstitial coarsening. No new consolidation, pleural effusion, or pneumothorax. Atherosclerotic calcification of the aorta. Enteric tube with tip in the body of the stomach. Persistent dilatation of small-bowel loops measuring up to approximately 6 cm in caliber. Bilateral ureteral stents noted. There is lumbar fusion. IMPRESSION: 1. Right IJ central venous line with tip in the region of the cavoatrial junction. No pneumothorax. 2. Enteric tube with tip in the body of the stomach. Persistent small bowel obstruction. Electronically Signed   By: Anner Crete M.D.   On: 11/23/2021 19:31   DG Chest Port 1 View  Result Date: 11/26/2021 CLINICAL DATA:  Questionable sepsis EXAM: PORTABLE CHEST 1 VIEW COMPARISON:  10/24/2021 FINDINGS: Stable interstitial coarsening with interstitial lung disease by June 2022 CT. There is no edema, consolidation, effusion, or pneumothorax. Normal heart size and mediastinal contours. IMPRESSION: Interstitial lung disease.  No acute superimposed finding. Electronically Signed   By: Jorje Guild M.D.   On: 11/01/2021 10:44   DG Abd Portable 1V  Result Date: 11/25/2021 CLINICAL DATA:  Encounter for nasogastric tube EXAM: PORTABLE ABDOMEN - 1 VIEW COMPARISON:  Same day CT FINDINGS: Nasogastric tube courses down the left mainstem/lower lobe bronchus. Multiple dilated loops of small bowel in the abdomen partially visualized. No acute osseous abnormality. Partially visualized lumbar spine fusion hardware. Partially visualized nephroureteral stents. IMPRESSION: Nasogastric tube is malpositioned within the left mainstem/lower lobe bronchus. Recommend removal. Small bowel obstruction. Critical Value/emergent results were called by telephone at the time of interpretation on 11/15/2021 at 3:29 pm to provider [facility] , who verbally acknowledged these results. Electronically Signed   By: Maurine Simmering M.D.   On: 11/01/2021 15:30   CT CHEST ABDOMEN PELVIS WO CONTRAST  Result  Date: 11/10/2021 CLINICAL DATA:  Hypotension, shock EXAM: CT CHEST, ABDOMEN AND PELVIS WITHOUT CONTRAST TECHNIQUE: Multidetector CT imaging of the chest, abdomen and pelvis was performed following the standard protocol without IV contrast. COMPARISON:  CT abdomen and pelvis 06/09/2021 FINDINGS: CT CHEST FINDINGS Cardiovascular: Heart size is normal. No pericardial effusion identified. Coronary artery calcifications noted. Main pulmonary artery is normal caliber. Note is made of a small amount of air in the brachiocephalic vein. Thoracic aorta is normal in caliber with mild calcified plaques. Mediastinum/Nodes: No bulky axillary, mediastinal or hilar lymphadenopathy identified. Lungs/Pleura: Lungs are hyperinflated with mild emphysematous changes. There is mild interlobular septal thickening which is new since previous study. Dependent subsegmental atelectatic changes bilaterally. No focal consolidation visualized. No significant pleural effusion. No pneumothorax. Musculoskeletal: No suspicious bony lesions identified in the chest. Small foci of subcutaneous or vascular air identified in the right upper chest region as well as in the subcutaneous plane in the lower chest extending to the abdomen. CT ABDOMEN PELVIS FINDINGS Hepatobiliary: No focal liver abnormality is seen. No gallstones, gallbladder wall thickening, or biliary dilatation. Pancreas: Unremarkable. No pancreatic ductal dilatation or surrounding inflammatory changes. Spleen: Normal in size without focal abnormality. Adrenals/Urinary Tract: Adrenal glands appear normal. Recent cystoprostatectomy surgical changes. No nephrolithiasis or hydronephrosis identified bilaterally. Bilateral ureteral stents which terminate distally through an ostomy in the right abdomen. Stomach/Bowel: Limited evaluation of bowel due to lack of contrast. There are multiple loops of distended small bowel with air-fluid levels, mostly in the left abdomen which measure up to 4.9 cm  in diameter. Accompanying mesenteric engorgement/fat stranding. No pneumatosis identified. Distal loops of ileum are decompressed. Postsurgical changes in the right lower quadrant. Multiple foci of extraluminal air visualized  in the right pelvis and a few in the upper abdomen. Retained fecal material in the colon and rectum. Vascular/Lymphatic: Aortic atherosclerosis. No enlarged abdominal or pelvic lymph nodes. Reproductive: Prostate gland surgically absent. Other: Subcutaneous emphysema identified throughout the anterior abdomen, pelvis and anterior aspect of the visualized proximal lower extremities. No associated defined fluid collections. Small to moderate volume ascites mostly in the pelvis and in the left abdomen Musculoskeletal: Degenerative and postsurgical changes of the lumbar spine. No suspicious bony lesions visualized. IMPRESSION: 1. Findings concerning for small bowel obstruction, with proximal loops of small bowel measuring up to 4.9 cm in diameter with air-fluid levels. Possible transition point in the lower abdomen/pelvis near a surgical anastomosis. 2. Small to moderate volume ascites mostly located in the pelvis and left abdomen. 3. Small amount of free intraperitoneal air, mostly in the right pelvis surrounded by ascites. May be postoperative in nature, correlate clinically and follow-up recommended. 4. Small to moderate amount of subcutaneous emphysema mostly in the anterior abdominal and pelvic walls. No accompanying defined fluid collections or significant adjacent inflammatory fat stranding. 5. Cystoprostatectomy changes with right-sided urinary ostomy. Bilateral ureteral stents extending through the ostomy. No hydronephrosis. 6. Mild interstitial pulmonary edema. Emphysematous and subsegmental atelectatic changes. Discussed with Dr. Regenia Skeeter over the telephone at 1:09 p.m. on 11/08/2021 with read back. Electronically Signed   By: Ofilia Neas M.D.   On: 11/17/2021 13:26     Assessment/Plan:  80 y/o male s/p s/p cystectomy with ileal conduit, extensive lysis of adhesions and small bowel resection 10/25/2021. Presents with post op sepsis likely from urinary source with Klebsiella UTI   -No indication for abdominal exploration  -Appreciate Gen Surg recommendations and assistance -Agree with broad spectrum abx, hydration, bowel rest w/ NGT -Appreciate ICU resuscitative efforts    LOS: 1 day   William [name] 11/10/2021, 11:52 AM

## 2021-11-10 NOTE — Progress Notes (Addendum)
NAME:  OLIVIA ROYSE, MRN:  [id number], DOB:  [birth year], LOS: 1 ADMISSION DATE:  11/17/2021, CONSULTATION DATE:  11/10 REFERRING MD: Dr. Regenia Skeeter, CHIEF COMPLAINT: Low blood pressure    History of Present Illness:  80 y/o M who presented to [facility] ER on 11/10 after being seen in the Urology Clinic with finding of low blood pressure.    The patient underwent a robotic assisted laparoscopic radical cystectomy, robotic radical prostatectomy, bilateral pelvic lymph node dissection, extensive laparoscopic adhesiolysis, small bowel resection and ileal conduit urinary diversion on 1026 per [name] of Urology in the setting of invasive bladder cancer.  He was on the curative intent path with neoadjuvant chemotherapy (gem-cis) with curative intent resection.  His hospital course was largely uneventful - progressing to regular diet PO's by POD5, walking independently with walker and was able to change his ostomy pouch by discharge.  He was discharged on POD8 (11/3) with percocet for pain control.   The patient reports since discharge he has had been having abdominal discomfort.  Since 11/5, he had been dealing with constipation.  He and his wife noted he was eating and drinking less over the week and his urine was darker.  He developed increasing LLQ pain and intermittent ("up and down") fevers at home.  His temperature max at home was 100.2 on 11/6.  He denies chest pain, SOB, presyncopal episodes, N/V.  States his ileal conduit has been functioning normally.  His abdominal pain increased on 11/9 and pain medications were not working prompting his wife to call EMS.  He was not transported and was told to add naproxen to his pain control.  They started using this on 11/9 for help with pain.    He was seen in follow up at the Urology Clinic on 11/11 and was found to have blood pressure in the 02'V systolic.  The patient was referred to the ER for assessment.    Initial evaluation in the ER notable for hypotension  with SBP in the 60's, he was treated with IVF and then started on levophed for BP support. Labs - Na 131, K 5.2, Cl 98, CO2 21, glucose 88, BUN 43, Sr Cr 2.87, AG 12, lactic acid 4.6, WBC 7, Hgb 9.2, platelets 364 with mild left shift.  UA (from ileal conduit) with 100 protein, large leukocytes, >50 WBC and many bacteria.  CT of the chest, abdomen and pelvis were obtained which was notable for findings concerning for underlying ILD, coronary artery calcification, concern for small bowel obstruction / possible transition point in the lower abdomen/pelvis near a surgical anastomosis, small to moderate volume ascites in the pelvis / left abd, small amount of free intraperitoneal air (?post operative), small to moderate amount of subcutaneous emphysema without accompanying fluid collections / stranding, cystoprostatectomy changes with right sided urinary ostomy, bilateral ureteral stents extending through the ostomy, no hydronephrosis.    PCCM consulted for ICU evaluation.   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  Had bowel obstruction 6 months later requiring repeat surgery.   Significant Hospital Events: Including procedures, antibiotic start and stop dates in addition to other pertinent events   11/10 Admit with septic shock, concern for  abdominal source. On vasopressors.  11/11 Levophed at 5 mcg, renal function improved  Interim History / Subjective:  Afebrile  Renal function improved  On 75mcg levophed  Pt reports being thirsty, continues to have pain in lower abdomen  Glucose in 50's overnight, D5LR initiated   Objective   Blood pressure (!) 104/56, pulse (!) 102, temperature  97.7 F (36.5 C), temperature source Oral, resp. rate 20, height 5\' 7"  (1.702 m), weight 81.3 kg, SpO2 94 %.        Intake/Output Summary (Last 24 hours) at 11/10/2021 0741 Last data filed at 11/10/2021 0600 Gross per 24 hour  Intake 3965.6 ml  Output 2300 ml  Net 1665.6 ml   Filed Weights   11/08/2021 1104 11/17/2021 1615 11/10/21 0453  Weight: 79.4 kg 83 kg 81.3 kg    Examination: General: ill appearing adult male lying in bed in NAD HEENT: MM pink/moist, anicteric, wearing glasses, old skin graft above L eye Neuro: AAOx4, speech clear, MAE / generalized weakness  CV: s1s2 RRR, occasional PVC's, no m/r/g PULM: non-labored at rest, lungs bilaterally clear anterior, diminished bibasilar with crackles  GI: soft, bsx4 active, midline healing surgical scar, ostomy bag, LLQ drain site with erythema, tender to palpation on left lower Extremities: warm/dry, BLE 2+ pitting edema  Skin: no rashes or lesions  Resolved Hospital Problem list      Assessment & Plan:   Septic Shock  Klebsiella Oxytoca UTI with GNR Bacteremia  Abdominal vs Urinary sourc. Note urine drawn from ileostomy. Left shift on differential.  -vasopressors for MAP >65 -await final cultures with sensitivities  -PCT elevated but in setting of recent surgery / malignancy / AKI  -cortisol wnl  -change abx to rocephin for klebsiella oxytoca UTI  -CXR with small effusion / mild edema but no overt infiltrate  -pulmonary hygiene  Small Bowel Obstruction  With possible transition point on CT at anastomosis site  -bowel rest per CCS  -per surgery, no indication for exploration at this time  -NGT for decompression  -NPO  -ensure oral care   AKI  Hyponatremia  Suspect pre-renal in setting of poor intake.  Note NSAID use in last 24 hours.  -urine studies (FENa) not sent, cr improving  -Trend BMP / urinary output -Replace electrolytes as indicated -Avoid nephrotoxic agents, ensure adequate renal  perfusion  Hypoglycemia -D5LR at 22ml/hr -monitor fluid status closely, may need to reduce rate  Normocytic Anemia  -follow CBC -transfuse for Hgb <7% or active bleeding  Trigeminy  Coronary Calcifications Noted on CT  -continue tele  -await ECHO findings   Suspected ILD  Former Tobacco Abuse  Noted on CT imaging this admission.  Former smoker, 7th decade of life.  -will need outpatient pulmonary follow up -continue home spiriva, flonase   Invasive Muscle Bladder Cancer s/p Radical Cystectomy, Prostatectomy & Ileal Conduit  10/29 robotic assisted laparoscopic radical cystectomy, robotic radical prostatectomy, bilateral pelvic lymph node dissection, extensive laparoscopic adhesiolysis, small bowel resection and ileal conduit urinary diversion on 1026 per [name] of Urology in the setting of invasive bladder cancer.  -ostomy care per protocol  Chronic Back Pain  -hold home gabapentin, oxycodone, zanaflex given SBO  -may need PRN low dose IV pain control  hold gabapentin, oxycodone, zanaflex  -morphine PRN for pain control    Best Practice (right click and "Reselect all SmartList Selections" daily)  Diet/type: NPO DVT prophylaxis: prophylactic heparin  GI prophylaxis: N/A Lines: N/A Foley:  N/A Code Status:  full code  Last date of multidisciplinary goals of care discussion: wife updated 11/10 on plan of care. Full code.   Critical care time: 15 minutes    Noe Gens, MSN, APRN, NP-C, AGACNP-BC [facility] 11/10/2021, 7:41 AM   Please see [email or web address] for pager details.   From 7A-7P if no response, please call [phone] After hours, please call ELink [phone]

## 2021-11-11 DIAGNOSIS — A419 Sepsis, unspecified organism: Secondary | ICD-10-CM | POA: Diagnosis not present

## 2021-11-11 DIAGNOSIS — R7881 Bacteremia: Secondary | ICD-10-CM | POA: Diagnosis present

## 2021-11-11 DIAGNOSIS — N390 Urinary tract infection, site not specified: Secondary | ICD-10-CM | POA: Diagnosis present

## 2021-11-11 DIAGNOSIS — R6521 Severe sepsis with septic shock: Secondary | ICD-10-CM | POA: Diagnosis not present

## 2021-11-11 DIAGNOSIS — B9689 Other specified bacterial agents as the cause of diseases classified elsewhere: Secondary | ICD-10-CM | POA: Diagnosis present

## 2021-11-11 LAB — GLUCOSE, CAPILLARY
Glucose-Capillary: 109 mg/dL — ABNORMAL HIGH (ref 70–99)
Glucose-Capillary: 109 mg/dL — ABNORMAL HIGH (ref 70–99)
Glucose-Capillary: 86 mg/dL (ref 70–99)
Glucose-Capillary: 87 mg/dL (ref 70–99)
Glucose-Capillary: 92 mg/dL (ref 70–99)

## 2021-11-11 LAB — BASIC METABOLIC PANEL
Anion gap: 6 (ref 5–15)
BUN: 31 mg/dL — ABNORMAL HIGH (ref 8–23)
CO2: 26 mmol/L (ref 22–32)
Calcium: 7.5 mg/dL — ABNORMAL LOW (ref 8.9–10.3)
Chloride: 103 mmol/L (ref 98–111)
Creatinine, Ser: 1.21 mg/dL (ref 0.61–1.24)
GFR, Estimated: >60 mL/min (ref >60)
Glucose, Bld: 89 mg/dL (ref 70–99)
Potassium: 3.9 mmol/L (ref 3.5–5.1)
Sodium: 135 mmol/L (ref 135–145)

## 2021-11-11 LAB — CBC
HCT: 27.5 % — ABNORMAL LOW (ref 39.0–52.0)
Hemoglobin: 8.8 g/dL — ABNORMAL LOW (ref 13.0–17.0)
MCH: 29.7 pg (ref 26.0–34.0)
MCHC: 32 g/dL (ref 30.0–36.0)
MCV: 92.9 fL (ref 80.0–100.0)
Platelets: 281 10*3/uL (ref 150–400)
RBC: 2.96 MIL/uL — ABNORMAL LOW (ref 4.22–5.81)
RDW: 14.9 % (ref 11.5–15.5)
WBC: 18.6 10*3/uL — ABNORMAL HIGH (ref 4.0–10.5)
nRBC: 0.1 % (ref 0.0–0.2)

## 2021-11-11 LAB — PROCALCITONIN: Procalcitonin: 18.72 ng/mL

## 2021-11-11 NOTE — Progress Notes (Signed)
PT Cancellation Note  Patient Details Name: William [name] MRN: [id number] DOB: [birth year]   Cancelled Treatment:    Reason Eval/Treat Not Completed: Patient declined, no reason specified (Pt deferring mobility due to fatigue and reports "they just got done doing all this stuff" requesting for PT to come back in a couple of hours. PT will follow up as schedule allows.)   Festus Barren., PT, DPT  Acute Rehabilitation Services  Office [phone] 11/11/2021, 1:20 PM

## 2021-11-11 NOTE — Progress Notes (Signed)
Subjective/Chief Complaint: Pt reports passing so flatus Has some left sided abdominal pain Still with more than 1 liter NG output   Objective: Vital signs in last 24 hours: Temp:  [97.3 F (36.3 C)-98.7 F (37.1 C)] 97.6 F (36.4 C) (11/12 0400) Pulse Rate:  [98-113] 103 (11/12 0600) Resp:  [12-23] 17 (11/12 0600) BP: (87-135)/(47-86) 115/71 (11/12 0600) SpO2:  [90 %-95 %] 93 % (11/12 0600) Weight:  [77.5 kg] 77.5 kg (11/12 0500) Last BM Date: 11/04/21  Intake/Output from previous day: 11/11 0701 - 11/12 0700 In: 2510 [I.V.:1903.8; NG/GT:30; IV Piggyback:576.2] Out: 3150 [Urine:2050; Emesis/NG output:1100] Intake/Output this shift: No intake/output data recorded.  Exam; Awake and alert Abdomen a little full, minimally tender, no frank peritonitis, urostomy pink   Lab Results:  Recent Labs    11/10/21 0514 11/11/21 0542  WBC 12.5* 18.6*  HGB 9.2* 8.8*  HCT 28.1* 27.5*  PLT 359 281   BMET Recent Labs    11/10/21 0500 11/11/21 0542  NA 134* 135  K 4.6 3.9  CL 100 103  CO2 23 26  GLUCOSE 87 89  BUN 35* 31*  CREATININE 1.67* 1.21  CALCIUM 7.4* 7.5*   PT/INR Recent Labs    11/06/2021 1016 11/10/21 0500  LABPROT 17.0* 17.8*  INR 1.4* 1.5*   ABG No results for input(s): PHART, HCO3 in the last 72 hours.  Invalid input(s): PCO2, PO2  Studies/Results: DG Abd 1 View  Result Date: 11/28/2021 CLINICAL DATA:  Suspected sepsis. Small-bowel obstruction seen on prior CT. EXAM: PORTABLE CHEST 1 VIEW COMPARISON:  Chest radiograph dated 11/07/2021 and CT dated 11/13/2021 FINDINGS: Right IJ central venous line with tip in the region of the cavoatrial junction. There is mild cardiomegaly with mild vascular congestion. Diffuse chronic interstitial coarsening. No new consolidation, pleural effusion, or pneumothorax. Atherosclerotic calcification of the aorta. Enteric tube with tip in the body of the stomach. Persistent dilatation of small-bowel loops measuring up  to approximately 6 cm in caliber. Bilateral ureteral stents noted. There is lumbar fusion. IMPRESSION: 1. Right IJ central venous line with tip in the region of the cavoatrial junction. No pneumothorax. 2. Enteric tube with tip in the body of the stomach. Persistent small bowel obstruction. Electronically Signed   By: Anner Crete M.D.   On: 11/10/2021 19:31   DG Chest Port 1 View  Result Date: 11/10/2021 CLINICAL DATA:  Suspected abdominal sepsis EXAM: PORTABLE CHEST 1 VIEW COMPARISON:  Prior chest x-ray 10/31/2021 FINDINGS: Right IJ central venous catheter. Catheter tip overlies the right atrium. Gastric tube is present. The tip lies within the stomach. Stable cardiac and mediastinal contours. Atherosclerotic calcifications present in the transverse aorta. Inspiratory volumes are lower. Increasing perihilar streaky airspace opacities. Pulmonary vascular congestion bordering on mild edema. Small bilateral layering pleural effusions. No pneumothorax. No acute osseous abnormality. IMPRESSION: 1. Lower inspiratory volumes with increasing perihilar and bibasilar atelectasis. 2. Pulmonary vascular congestion with borderline interstitial edema. 3. Small bilateral layering pleural effusions. 4. Stable and satisfactory support apparatus. Electronically Signed   By: Jacqulynn Cadet M.D.   On: 11/10/2021 06:25   DG CHEST PORT 1 VIEW  Result Date: 11/25/2021 CLINICAL DATA:  Suspected sepsis. Small-bowel obstruction seen on prior CT. EXAM: PORTABLE CHEST 1 VIEW COMPARISON:  Chest radiograph dated 11/21/2021 and CT dated 11/16/2021 FINDINGS: Right IJ central venous line with tip in the region of the cavoatrial junction. There is mild cardiomegaly with mild vascular congestion. Diffuse chronic interstitial coarsening. No new consolidation, pleural effusion, or  pneumothorax. Atherosclerotic calcification of the aorta. Enteric tube with tip in the body of the stomach. Persistent dilatation of small-bowel loops  measuring up to approximately 6 cm in caliber. Bilateral ureteral stents noted. There is lumbar fusion. IMPRESSION: 1. Right IJ central venous line with tip in the region of the cavoatrial junction. No pneumothorax. 2. Enteric tube with tip in the body of the stomach. Persistent small bowel obstruction. Electronically Signed   By: Anner Crete M.D.   On: 11/10/2021 19:31   DG Chest Port 1 View  Result Date: 11/15/2021 CLINICAL DATA:  Questionable sepsis EXAM: PORTABLE CHEST 1 VIEW COMPARISON:  10/24/2021 FINDINGS: Stable interstitial coarsening with interstitial lung disease by June 2022 CT. There is no edema, consolidation, effusion, or pneumothorax. Normal heart size and mediastinal contours. IMPRESSION: Interstitial lung disease.  No acute superimposed finding. Electronically Signed   By: Jorje Guild M.D.   On: 11/27/2021 10:44   DG Abd Portable 1V  Result Date: 11/14/2021 CLINICAL DATA:  Encounter for nasogastric tube EXAM: PORTABLE ABDOMEN - 1 VIEW COMPARISON:  Same day CT FINDINGS: Nasogastric tube courses down the left mainstem/lower lobe bronchus. Multiple dilated loops of small bowel in the abdomen partially visualized. No acute osseous abnormality. Partially visualized lumbar spine fusion hardware. Partially visualized nephroureteral stents. IMPRESSION: Nasogastric tube is malpositioned within the left mainstem/lower lobe bronchus. Recommend removal. Small bowel obstruction. Critical Value/emergent results were called by telephone at the time of interpretation on 11/10/2021 at 3:29 pm to provider [facility] , who verbally acknowledged these results. Electronically Signed   By: Maurine Simmering M.D.   On: 11/13/2021 15:30   ECHOCARDIOGRAM COMPLETE  Result Date: 11/10/2021    ECHOCARDIOGRAM REPORT   Patient Name:   BADER STUBBLEFIELD Date of Exam: 11/10/2021 Medical Rec #:  [id number]      Height:       67.0 in Accession #:    [id number]     Weight:       179.2 lb Date of Birth:  1941-09-20       BSA:          1.930 m Patient Age:    80 years       BP:           108/63 mmHg Patient Gender: M              HR:           111 bpm. Exam Location:  Inpatient Procedure: 3D Echo, 2D Echo, Cardiac Doppler and Color Doppler Indications:    R94.31 Abnormal EKG  History:        Patient has prior history of Echocardiogram examinations, most                 recent 02/14/2017. Abnormal ECG, COPD, Arrythmias:Tachycardia;                 Signs/Symptoms:Shortness of Breath, Dyspnea, Syncope,                 Dizziness/Lightheadedness and Hypotension. Septic shock. Cancer.  Sonographer:    Roseanna Rainbow RDCS Referring Phys: [id number] Donita Brooks  Sonographer Comments: Technically difficult study due to poor echo windows. IMPRESSIONS  1. Left ventricular ejection fraction, by estimation, is 55 to 60%. The left ventricle has normal function. The left ventricle has no regional wall motion abnormalities. There is mild left ventricular hypertrophy. Left ventricular diastolic parameters are consistent with Grade I diastolic dysfunction (impaired relaxation).  2. Right ventricular systolic  function is normal. The right ventricular size is normal. There is normal pulmonary artery systolic pressure.  3. The mitral valve is normal in structure. No evidence of mitral valve regurgitation.  4. The aortic valve is normal in structure. Aortic valve regurgitation is not visualized.  5. Aortic small ascending aortic aneurysm 3.8 cm.  6. The inferior vena cava is normal in size with greater than 50% respiratory variability, suggesting right atrial pressure of 3 mmHg. Comparison(s): No prior Echocardiogram. FINDINGS  Left Ventricle: Left ventricular ejection fraction, by estimation, is 55 to 60%. The left ventricle has normal function. The left ventricle has no regional wall motion abnormalities. The left ventricular internal cavity size was normal in size. There is  mild left ventricular hypertrophy. Left ventricular diastolic parameters are  consistent with Grade I diastolic dysfunction (impaired relaxation). Right Ventricle: The right ventricular size is normal. No increase in right ventricular wall thickness. Right ventricular systolic function is normal. There is normal pulmonary artery systolic pressure. The tricuspid regurgitant velocity is 2.30 m/s, and  with an assumed right atrial pressure of 3 mmHg, the estimated right ventricular systolic pressure is 93.5 mmHg. Left Atrium: Left atrial size was normal in size. Right Atrium: Right atrial size was normal in size. Pericardium: There is no evidence of pericardial effusion. Mitral Valve: The mitral valve is normal in structure. No evidence of mitral valve regurgitation. Tricuspid Valve: The tricuspid valve is normal in structure. Tricuspid valve regurgitation is trivial. Aortic Valve: The aortic valve is normal in structure. Aortic valve regurgitation is not visualized. Pulmonic Valve: The pulmonic valve was not well visualized. Pulmonic valve regurgitation is not visualized. Aorta: Small ascending aortic aneurysm 3.8 cm. Venous: The inferior vena cava is normal in size with greater than 50% respiratory variability, suggesting right atrial pressure of 3 mmHg. IAS/Shunts: No atrial level shunt detected by color flow Doppler.  LEFT VENTRICLE PLAX 2D LVIDd:         4.20 cm     Diastology LVIDs:         2.90 cm     LV e' medial:    8.49 cm/s LV PW:         1.10 cm     LV E/e' medial:  7.0 LV IVS:        1.10 cm     LV e' lateral:   9.46 cm/s LVOT diam:     2.30 cm     LV E/e' lateral: 6.3 LV SV:         106 LV SV Index:   55 LVOT Area:     4.15 cm                             3D Volume EF: LV Volumes (MOD)           3D EF:        69 % LV vol d, MOD A2C: 40.1 ml LV EDV:       114 ml LV vol d, MOD A4C: 49.1 ml LV ESV:       35 ml LV vol s, MOD A2C: 15.4 ml LV SV:        79 ml LV vol s, MOD A4C: 26.2 ml LV SV MOD A2C:     24.7 ml LV SV MOD A4C:     49.1 ml LV SV MOD BP:      24.2 ml RIGHT VENTRICLE  IVC RV S prime:     18.50 cm/s  IVC diam: 1.60 cm TAPSE (M-mode): 2.5 cm LEFT ATRIUM             Index        RIGHT ATRIUM           Index LA diam:        3.20 cm 1.66 cm/m   RA Area:     14.30 cm LA Vol (A2C):   22.4 ml 11.61 ml/m  RA Volume:   35.70 ml  18.50 ml/m LA Vol (A4C):   46.7 ml 24.19 ml/m LA Biplane Vol: 33.6 ml 17.41 ml/m  AORTIC VALVE              PULMONIC VALVE LVOT Vmax:   164.00 cm/s  PR End Diast Vel: 1.54 msec LVOT Vmean:  113.000 cm/s LVOT VTI:    0.256 m  AORTA Ao Root diam: 3.70 cm Ao Asc diam:  3.80 cm MITRAL VALVE               TRICUSPID VALVE MV Area (PHT): 4.49 cm    TR Peak grad:   21.2 mmHg MV Decel Time: 169 msec    TR Vmax:        230.00 cm/s MV E velocity: 59.60 cm/s MV A velocity: 74.10 cm/s  SHUNTS MV E/A ratio:  0.80        Systemic VTI:  0.26 m                            Systemic Diam: 2.30 cm Phineas Inches Electronically signed by Phineas Inches Signature Date/Time: 11/10/2021/3:22:03 PM    Final    CT CHEST ABDOMEN PELVIS WO CONTRAST  Result Date: 11/08/2021 CLINICAL DATA:  Hypotension, shock EXAM: CT CHEST, ABDOMEN AND PELVIS WITHOUT CONTRAST TECHNIQUE: Multidetector CT imaging of the chest, abdomen and pelvis was performed following the standard protocol without IV contrast. COMPARISON:  CT abdomen and pelvis 06/09/2021 FINDINGS: CT CHEST FINDINGS Cardiovascular: Heart size is normal. No pericardial effusion identified. Coronary artery calcifications noted. Main pulmonary artery is normal caliber. Note is made of a small amount of air in the brachiocephalic vein. Thoracic aorta is normal in caliber with mild calcified plaques. Mediastinum/Nodes: No bulky axillary, mediastinal or hilar lymphadenopathy identified. Lungs/Pleura: Lungs are hyperinflated with mild emphysematous changes. There is mild interlobular septal thickening which is new since previous study. Dependent subsegmental atelectatic changes bilaterally. No focal consolidation visualized. No significant  pleural effusion. No pneumothorax. Musculoskeletal: No suspicious bony lesions identified in the chest. Small foci of subcutaneous or vascular air identified in the right upper chest region as well as in the subcutaneous plane in the lower chest extending to the abdomen. CT ABDOMEN PELVIS FINDINGS Hepatobiliary: No focal liver abnormality is seen. No gallstones, gallbladder wall thickening, or biliary dilatation. Pancreas: Unremarkable. No pancreatic ductal dilatation or surrounding inflammatory changes. Spleen: Normal in size without focal abnormality. Adrenals/Urinary Tract: Adrenal glands appear normal. Recent cystoprostatectomy surgical changes. No nephrolithiasis or hydronephrosis identified bilaterally. Bilateral ureteral stents which terminate distally through an ostomy in the right abdomen. Stomach/Bowel: Limited evaluation of bowel due to lack of contrast. There are multiple loops of distended small bowel with air-fluid levels, mostly in the left abdomen which measure up to 4.9 cm in diameter. Accompanying mesenteric engorgement/fat stranding. No pneumatosis identified. Distal loops of ileum are decompressed. Postsurgical changes in the right lower quadrant. Multiple foci of extraluminal air  visualized in the right pelvis and a few in the upper abdomen. Retained fecal material in the colon and rectum. Vascular/Lymphatic: Aortic atherosclerosis. No enlarged abdominal or pelvic lymph nodes. Reproductive: Prostate gland surgically absent. Other: Subcutaneous emphysema identified throughout the anterior abdomen, pelvis and anterior aspect of the visualized proximal lower extremities. No associated defined fluid collections. Small to moderate volume ascites mostly in the pelvis and in the left abdomen Musculoskeletal: Degenerative and postsurgical changes of the lumbar spine. No suspicious bony lesions visualized. IMPRESSION: 1. Findings concerning for small bowel obstruction, with proximal loops of small bowel  measuring up to 4.9 cm in diameter with air-fluid levels. Possible transition point in the lower abdomen/pelvis near a surgical anastomosis. 2. Small to moderate volume ascites mostly located in the pelvis and left abdomen. 3. Small amount of free intraperitoneal air, mostly in the right pelvis surrounded by ascites. May be postoperative in nature, correlate clinically and follow-up recommended. 4. Small to moderate amount of subcutaneous emphysema mostly in the anterior abdominal and pelvic walls. No accompanying defined fluid collections or significant adjacent inflammatory fat stranding. 5. Cystoprostatectomy changes with right-sided urinary ostomy. Bilateral ureteral stents extending through the ostomy. No hydronephrosis. 6. Mild interstitial pulmonary edema. Emphysematous and subsegmental atelectatic changes. Discussed with Dr. Regenia Skeeter over the telephone at 1:09 p.m. on 11/24/2021 with read back. Electronically Signed   By: Ofilia Neas M.D.   On: 11/08/2021 13:26    Anti-infectives: Anti-infectives (From admission, onward)    Start     Dose/Rate Route Frequency Ordered Stop   11/10/21 1400  cefTRIAXone (ROCEPHIN) 2 g in sodium chloride 0.9 % 100 mL IVPB        2 g 200 mL/hr over 30 Minutes Intravenous Daily 11/10/21 1048     11/10/21 0800  ceFEPIme (MAXIPIME) 2 g in sodium chloride 0.9 % 100 mL IVPB  Status:  Discontinued        2 g 200 mL/hr over 30 Minutes Intravenous Every 12 hours 11/10/21 0618 11/10/21 1048   11/10/21 0630  ceFEPIme (MAXIPIME) 2 g in sodium chloride 0.9 % 100 mL IVPB  Status:  Discontinued        2 g 200 mL/hr over 30 Minutes Intravenous Every 24 hours 11/22/2021 1622 11/10/21 0618   11/05/2021 1624  vancomycin variable dose per unstable renal function (pharmacist dosing)  Status:  Discontinued         Does not apply See admin instructions 11/25/2021 1624 11/10/21 0613   11/28/2021 1030  ceFEPIme (MAXIPIME) 2 g in sodium chloride 0.9 % 100 mL IVPB        2 g 200 mL/hr  over 30 Minutes Intravenous  Once 11/21/2021 1016 11/04/2021 1133   11/24/2021 1030  metroNIDAZOLE (FLAGYL) IVPB 500 mg        500 mg 100 mL/hr over 60 Minutes Intravenous  Once 11/10/2021 1016 10/31/2021 1915   11/27/2021 1030  vancomycin (VANCOCIN) IVPB 1000 mg/200 mL premix  Status:  Discontinued        1,000 mg 200 mL/hr over 60 Minutes Intravenous  Once 11/18/2021 1016 11/11/2021 1023   11/01/2021 1030  vancomycin (VANCOREADY) IVPB 1500 mg/300 mL        1,500 mg 150 mL/hr over 120 Minutes Intravenous  Once 11/16/2021 1023 11/03/2021 1341       Assessment/Plan:  SBO  - s/p Robotic-assisted laparoscopic radical cystectomy, radical prostatectomy, Bilateral pelvic lymph node dissection, Extensive laparoscopic adhesiolysis, Small bowel resection, Ileal conduit urinary diversion 10/26/21 [name]  WBC  up today  Clinically he does not have a surgical abdomen Plan continued bowel rest, NG Will check plain abd xray in the morning  Coralie Keens MD 11/11/2021

## 2021-11-11 NOTE — Plan of Care (Signed)
  Problem: Education: Goal: Knowledge of General Education information will improve Description: Including pain rating scale, medication(s)/side effects and non-pharmacologic comfort measures Outcome: Progressing   Problem: Coping: Goal: Level of anxiety will decrease Outcome: Progressing   Problem: Pain Managment: Goal: General experience of comfort will improve Outcome: Progressing   

## 2021-11-11 NOTE — Progress Notes (Signed)
PROGRESS NOTE    William [name]  TDD:[id number] DOB: 03-04-1941 DOA: 11/25/2021 PCP: [name], MD    Brief Narrative:  William [name] is a 80 year old male with past medical history significant for bladder cancer s/p robotic assisted laparoscopic radical cystectomy/prostatectomy, bilateral pelvic lymph node dissection, extensive laparoscopic adhesiolysis, small bowel resection with ileal conduit urinary diversion 10/26/21 by [name], COPD, history of tobacco use disorder who presented to [facility] ED on 11/10 from urology clinic with finding of low blood pressure.  Following recent discharge, patient has been dealing with constipation with poor oral intake with darker urine.  Also complaining of increasing left lower quadrant pain and intermittent fevers at home.  Reports that his ileal conduit has been functioning normally.  Pain worsened to the point where his pain medications were not effective.  EMS was called on 11/9 but was not transported and was instructed to add naproxen to his pain regimen which seemed to give some relief.  Patient was seen in follow-up in the urology clinic on 1111 was found to have blood pressure in the 62B systolic and was referred to the ED for further assessment.  Patient recent hospitalized and underwent robotic assisted laparoscopic radical cystectomy/prostatectomy with bilateral pelvic lymph node dissection, extensive laparoscopic adhesion anxiolysis, small bowel resection and ileal conduit urinary diversion on 10/25/2021 by [name] for his invasive bladder cancer; hospital course was largely uneventful and was discharged on postop day 8 11/02/2021.  In the ED, SBP's in the 60s, treated with IV fluids and started on Levophed for blood pressure support.  Sodium 131, potassium 5.2, chloride 98, CO2 21, glucose 88, BUN 43, creatinine 2.87, anion gap 12, lactic acid 4.6, WBC 7, hemoglobin 9.2, platelets 364 with left shift.  Urinalysis from ileal conduit with  100 protein, large leukocytes, greater than 50 WBCs and many bacteria.  CT chest/abdomen/pelvis were notable for findings concerning for underlying ILD, coronary artery calcification, concern for small bowel obstruction/possible transition point in the lower abdomen/pelvis near surgical anastomosis with small/moderate volume ascites in the pelvis/left abdomen, small amount of free intraperitoneal air (?post-op), small to moderate amount of subcutaneous emphysema without accompanying fluid collection/stranding, cystoprostatectomy changes with right-sided urinary ostomy, bilateral ureteral stents extending through the ostomy with no hydronephrosis.  Patient was admitted to the PCCM service due to septic shock from likely urinary source.  Care transferred from [facility] to hospital service on 11/11/2021.   Assessment & Plan:   Active Problems:   Septic shock ([name])   Septic shock, POA Klebsiella oxytoca UTI/septicemia, POA Patient presenting to ED from urology clinic after being found with systolic blood pressures in the 50s.  Patient was given IV fluid resuscitation without improvement and eventually placed on Levophed for vasopressor support.  Patient met septic shock criteria on admission with hypotension not improved with IV fluid resuscitation requiring Levophed in the setting of endorgan damage with acute renal failure, lactic acidosis with source of infection urinary tract with septicemia.  Patient was started on empiric antibiotics initially with vancomycin, cefepime, metronidazole and was de-escalated to ceftriaxone based on cultures. --Now titrated off of vasopressors --WBC 7.0>12.5>18.6 --PCT 41.64>32.48>18.72 --Blood cultures x2: GNR's with BC ID Klebsiella oxytoca --Urine culture >100K Klebsiella oxytoca --Ceftriaxone 2 g IV every 24 hours --Await further culture susceptibilities --Discontinue central line today --CBC daily  Small bowel obstruction CT abdomen/pelvis on admission with  concern for small bowel obstruction with possible transition point in the lower abdomen/pelvis near surgical anastomosis site.  General surgery  believes this is likely secondary to inflammatory process from recent abdominal surgery and not fibrous/adhesion in nature; and recommend conservative management with NG tube and bowel rest. --General surgery/urology following, appreciate assistance --NPO, NGT to LWIS, 1.1L out past 24h --Continue IVF with D5LR at 75 mL/h --Further per general surgery/urology  Acute renal failure Etiology likely secondary to prolonged prerenal state from dehydration and poor oral intake versus ATN from hypotension/septic shock as above. --Cr 2.87>3.00>1.67>1.21 --IVF with D5LR at 75 mL/h --Avoid nephrotoxins, renal dose all medications --BMP daily  Invasive muscle bladder cancer s/p radical cystectomy, prostatectomy and ileal conduit Underwent robotic assisted laparoscopic radical cystectomy, robotic radical prostatectomy, bilateral pelvic lymph node dissection, extensive laparoscopic adhesiolysis, small bowel resection and ileal conduit urinary diversion on 10/25/2021 by [name] of urology. --Urology following, appreciate assistance --Continue ostomy care; monitor urinary output  COPD History of tobacco abuse --Incruse Ellipta 1 puff daily --DuoNebs as needed --Currently on room air  Normocytic anemia 9.2>9.5>9.2>8.8, Stable --Transfuse for hemoglobin less than 7.0 or active bleeding --CBC daily  Suspected ILD Noted on CT imaging this admission.  History of tobacco abuse. --Flonase --Outpatient pulmonology follow-up  Chronic back pain At baseline on gabapentin, oxycodone, Zanaflex. --Holding home oral medicines due to small bowel obstruction --Morphine 2-43m IV q4h PRN  Weakness/deconditioning/debility: --PT/OT evaluation pending  DVT prophylaxis: heparin injection 5,000 Units Start: 11/07/2021 1515 SCDs Start: 11/16/2021 1502   Code Status: Full  Code Family Communication: No family present at bedside this morning  Disposition Plan:  Level of care: Med-Surg Status is: Inpatient  Remains inpatient appropriate because: Continues on IV antibiotics, awaiting culture identification/susceptibilities.  Pending PT/OT evaluation   Consultants:  PCCM - signed off 11/12 General surgery Urology  Procedures:  Central line placement 11/10  Antimicrobials:  Ceftriaxone 11/11>> Vancomycin 11/10 - 11/11 Cefepime 11/10 - 11/11 Metronidazole 11/10 - 11/10   Subjective: Patient seen examined at bedside, resting comfortably.  Feels weak and fatigued.  Complaining of some mild pain.  No other specific questions or concerns at this time.  Denies headache, no chest pain, no palpitations, no shortness of breath, no abdominal pain, no current fever/chills/night sweats, no nausea/vomiting/diarrhea.  No acute concerns overnight per nursing staff.  Objective: Vitals:   11/11/21 0600 11/11/21 0700 11/11/21 0800 11/11/21 0801  BP: 115/71 120/60 127/72   Pulse: (!) 103 (!) 105 (!) 101   Resp: '17 20 13   ' Temp:    97.6 F (36.4 C)  TempSrc:    Axillary  SpO2: 93% 92% 92%   Weight:      Height:        Intake/Output Summary (Last 24 hours) at 11/11/2021 0942 Last data filed at 11/11/2021 0817 Gross per 24 hour  Intake 2320.72 ml  Output 2900 ml  Net -579.28 ml   Filed Weights   11/14/2021 1615 11/10/21 0453 11/11/21 0500  Weight: 83 kg 81.3 kg 77.5 kg    Examination:  General exam: Appears calm and comfortable, chronically ill in appearance Respiratory system: Clear to auscultation. Respiratory effort normal.  On room air Cardiovascular system: S1 & S2 heard, RRR. No JVD, murmurs, rubs, gallops or clicks. No pedal edema. Gastrointestinal system: Abdomen is nondistended, soft, mild generalized tenderness to palpation. No organomegaly or masses felt.  No appreciable bowel sounds.  NG tube noted to suction, urostomy noted with yellow urine  in collection bag Central nervous system: Alert and oriented. No focal neurological deficits. Extremities: Symmetric 5 x 5 power. Skin: No rashes, lesions or  ulcers Psychiatry: Judgement and insight appear normal. Mood & affect appropriate.     Data Reviewed: I have personally reviewed following labs and imaging studies  CBC: Recent Labs  Lab 11/13/2021 1016 11/08/2021 1029 11/10/21 0514 11/11/21 0542  WBC 7.0  --  12.5* 18.6*  NEUTROABS 6.2  --   --   --   HGB 9.2* 9.5* 9.2* 8.8*  HCT 28.2* 28.0* 28.1* 27.5*  MCV 91.6  --  90.1 92.9  PLT 364  --  359 009   Basic Metabolic Panel: Recent Labs  Lab 11/20/2021 1016 11/11/2021 1029 11/22/2021 1749 11/10/21 0500 11/11/21 0542  NA 131* 129* 131* 134* 135  K 5.2* 5.6* 4.6 4.6 3.9  CL 98 97* 101 100 103  CO2 21*  --  16* 23 26  GLUCOSE 88 86 75 87 89  BUN 43* 49* 38* 35* 31*  CREATININE 2.87* 3.00* 2.17* 1.67* 1.21  CALCIUM 7.5*  --  7.3* 7.4* 7.5*  MG  --   --   --  2.0  --   PHOS  --   --   --  3.7  --    GFR: Estimated Creatinine Clearance: 46.3 mL/min (by C-G formula based on SCr of 1.21 mg/dL). Liver Function Tests: Recent Labs  Lab 11/24/2021 1016  AST 49*  ALT 21  ALKPHOS 57  BILITOT 1.0  PROT 5.4*  ALBUMIN 2.1*   No results for input(s): LIPASE, AMYLASE in the last 168 hours. No results for input(s): AMMONIA in the last 168 hours. Coagulation Profile: Recent Labs  Lab 11/20/2021 1016 11/10/21 0500  INR 1.4* 1.5*   Cardiac Enzymes: No results for input(s): CKTOTAL, CKMB, CKMBINDEX, TROPONINI in the last 168 hours. BNP (last 3 results) No results for input(s): PROBNP in the last 8760 hours. HbA1C: No results for input(s): HGBA1C in the last 72 hours. CBG: Recent Labs  Lab 11/10/21 1128 11/10/21 1524 11/10/21 1928 11/10/21 2318 11/11/21 0312  GLUCAP 87 90 92 88 92   Lipid Profile: No results for input(s): CHOL, HDL, LDLCALC, TRIG, CHOLHDL, LDLDIRECT in the last 72 hours. Thyroid Function Tests: No  results for input(s): TSH, T4TOTAL, FREET4, T3FREE, THYROIDAB in the last 72 hours. Anemia Panel: No results for input(s): VITAMINB12, FOLATE, FERRITIN, TIBC, IRON, RETICCTPCT in the last 72 hours. Sepsis Labs: Recent Labs  Lab 11/13/2021 1016 11/05/2021 1216 11/02/2021 1749 11/10/21 0500 11/11/21 0542  PROCALCITON  --   --  41.64 32.48 18.72  LATICACIDVEN 4.6* 4.5* 4.8*  --   --     Recent Results (from the past 240 hour(s))  Blood Culture (routine x 2)     Status: None (Preliminary result)   Collection Time: 11/16/2021 10:16 AM   Specimen: BLOOD RIGHT ARM  Result Value Ref Range Status   Specimen Description BLOOD RIGHT ARM  Final   Special Requests   Final    BOTTLES DRAWN AEROBIC AND ANAEROBIC Blood Culture adequate volume   Culture  Setup Time   Final    GRAM NEGATIVE RODS IN BOTH AEROBIC AND ANAEROBIC BOTTLES CRITICAL RESULT CALLED TO, READ BACK BY AND VERIFIED WITH: M LILLISTON,PHARMD'@0544'  11/10/21 [place] Performed at [facility], [place] [address], [place], [place] [address]    Culture GRAM NEGATIVE RODS  Final   Report Status PENDING  Incomplete  Urine Culture     Status: Abnormal (Preliminary result)   Collection Time: 11/17/2021 10:16 AM   Specimen: In/Out Cath Urine  Result Value Ref Range Status  Specimen Description   Final    IN/OUT CATH URINE Performed at [facility] [address] [place], [place] [address]    Special Requests   Final    NONE Performed at [facility], [place] [address], [place], [place] [address]    Culture (A)  Final    >=100,000 COLONIES/mL KLEBSIELLA OXYTOCA CULTURE REINCUBATED FOR BETTER GROWTH SUSCEPTIBILITIES TO FOLLOW Performed at [facility], [place] [address] [place], [place] [address]    Report Status PENDING  Incomplete  Blood Culture ID Panel (Reflexed)     Status: Abnormal   Collection Time: 11/11/2021 10:16 AM  Result Value Ref Range Status   Enterococcus faecalis NOT DETECTED NOT  DETECTED Final   Enterococcus Faecium NOT DETECTED NOT DETECTED Final   Listeria monocytogenes NOT DETECTED NOT DETECTED Final   Staphylococcus species NOT DETECTED NOT DETECTED Final   Staphylococcus aureus (BCID) NOT DETECTED NOT DETECTED Final   Staphylococcus epidermidis NOT DETECTED NOT DETECTED Final   Staphylococcus lugdunensis NOT DETECTED NOT DETECTED Final   Streptococcus species NOT DETECTED NOT DETECTED Final   Streptococcus agalactiae NOT DETECTED NOT DETECTED Final   Streptococcus pneumoniae NOT DETECTED NOT DETECTED Final   Streptococcus pyogenes NOT DETECTED NOT DETECTED Final   A.calcoaceticus-baumannii NOT DETECTED NOT DETECTED Final   Bacteroides fragilis NOT DETECTED NOT DETECTED Final   Enterobacterales DETECTED (A) NOT DETECTED Final    Comment: Enterobacterales represent a large order of gram negative bacteria, not a single organism. CRITICAL RESULT CALLED TO, READ BACK BY AND VERIFIED WITH: M LILLISTON,PHARMD'@0544'  11/10/21 [place]    Enterobacter cloacae complex NOT DETECTED NOT DETECTED Final   Escherichia coli NOT DETECTED NOT DETECTED Final   Klebsiella aerogenes NOT DETECTED NOT DETECTED Final   Klebsiella oxytoca DETECTED (A) NOT DETECTED Final    Comment: CRITICAL RESULT CALLED TO, READ BACK BY AND VERIFIED WITH: M LILLISTON,PHARMD'@0544'  11/10/21 [place]    Klebsiella pneumoniae NOT DETECTED NOT DETECTED Final   Proteus species NOT DETECTED NOT DETECTED Final   Salmonella species NOT DETECTED NOT DETECTED Final   Serratia marcescens NOT DETECTED NOT DETECTED Final   Haemophilus influenzae NOT DETECTED NOT DETECTED Final   Neisseria meningitidis NOT DETECTED NOT DETECTED Final   Pseudomonas aeruginosa NOT DETECTED NOT DETECTED Final   Stenotrophomonas maltophilia NOT DETECTED NOT DETECTED Final   Candida albicans NOT DETECTED NOT DETECTED Final   Candida auris NOT DETECTED NOT DETECTED Final   Candida glabrata NOT DETECTED NOT DETECTED Final   Candida krusei NOT  DETECTED NOT DETECTED Final   Candida parapsilosis NOT DETECTED NOT DETECTED Final   Candida tropicalis NOT DETECTED NOT DETECTED Final   Cryptococcus neoformans/gattii NOT DETECTED NOT DETECTED Final   CTX-M ESBL NOT DETECTED NOT DETECTED Final   Carbapenem resistance IMP NOT DETECTED NOT DETECTED Final   Carbapenem resistance KPC NOT DETECTED NOT DETECTED Final   Carbapenem resistance NDM NOT DETECTED NOT DETECTED Final   Carbapenem resist OXA 48 LIKE NOT DETECTED NOT DETECTED Final   Carbapenem resistance VIM NOT DETECTED NOT DETECTED Final    Comment: Performed at [facility], [address] [place], [place] [address]  Blood Culture (routine x 2)     Status: None (Preliminary result)   Collection Time: 11/08/2021 10:21 AM   Specimen: BLOOD RIGHT WRIST  Result Value Ref Range Status   Specimen Description   Final    BLOOD RIGHT WRIST Performed at [facility], [address], [place], [place] [address]    Special Requests   Final    BOTTLES DRAWN AEROBIC ONLY Blood Culture adequate volume Performed at [place] [address] [place], [place] [address]    Culture  Setup Time   Final    GRAM NEGATIVE RODS AEROBIC BOTTLE ONLY CRITICAL VALUE NOTED.  VALUE IS CONSISTENT WITH PREVIOUSLY REPORTED AND CALLED VALUE.    Culture   Final    NO GROWTH 2 DAYS Performed at [facility], [place] [address] [place], [place] [address]    Report Status PENDING  Incomplete  Resp Panel by RT-PCR (Flu A&B, Covid) Nasopharyngeal Swab     Status: None   Collection Time: 11/26/2021 11:18 AM   Specimen: Nasopharyngeal Swab; Nasopharyngeal(NP) swabs in vial transport medium  Result Value Ref Range Status   SARS Coronavirus 2 by RT PCR NEGATIVE NEGATIVE Final    Comment: (NOTE) SARS-CoV-2 target nucleic acids are NOT DETECTED.  The SARS-CoV-2 RNA is generally detectable in upper respiratory specimens during the acute phase of infection. The lowest concentration  of SARS-CoV-2 viral copies this assay can detect is 138 copies/mL. A negative result does not preclude SARS-Cov-2 infection and should not be used as the sole basis for treatment or other patient management decisions. A negative result may occur with  improper specimen collection/handling, submission of specimen other than nasopharyngeal swab, presence of viral mutation(s) within the areas targeted by this assay, and inadequate number of viral copies(<138 copies/mL). A negative result must be combined with clinical observations, patient history, and epidemiological information. The expected result is Negative.  Fact Sheet for Patients:  EntrepreneurPulse.com.au  Fact Sheet for Healthcare Providers:  IncredibleEmployment.be  This test is no t yet approved or cleared by the [place] FDA and  has been authorized for detection and/or diagnosis of SARS-CoV-2 by FDA under an Emergency Use Authorization (EUA). This EUA will remain  in effect (meaning this test can be used) for the duration of the COVID-19 declaration under Section 564(b)(1) of the Act, 21 U.S.C.section 360bbb-3(b)(1), unless the authorization is terminated  or revoked sooner.       Influenza A by PCR NEGATIVE NEGATIVE Final   Influenza B by PCR NEGATIVE NEGATIVE Final    Comment: (NOTE) The Xpert Xpress SARS-CoV-2/FLU/RSV plus assay is intended as an aid in the diagnosis of influenza from Nasopharyngeal swab specimens and should not be used as a sole basis for treatment. Nasal washings and aspirates are unacceptable for Xpert Xpress SARS-CoV-2/FLU/RSV testing.  Fact Sheet for Patients: EntrepreneurPulse.com.au  Fact Sheet for Healthcare Providers: IncredibleEmployment.be  This test is not yet approved or cleared by the [place] FDA and has been authorized for detection and/or diagnosis of SARS-CoV-2 by FDA under an Emergency Use  Authorization (EUA). This EUA will remain in effect (meaning this test can be used) for the duration of the COVID-19 declaration under Section 564(b)(1) of the Act, 21 U.S.C. section 360bbb-3(b)(1), unless the authorization is terminated or revoked.  Performed at [facility], [place] [address] [place], [place] [address]          Radiology Studies: DG Abd 1 View  Result Date: 11/28/2021 CLINICAL DATA:  Suspected sepsis. Small-bowel obstruction seen on prior CT. EXAM: PORTABLE CHEST 1 VIEW COMPARISON:  Chest radiograph dated 11/04/2021 and CT dated 11/14/2021 FINDINGS: Right IJ central venous line with tip in the region of the cavoatrial junction. There is mild cardiomegaly with mild vascular congestion. Diffuse chronic interstitial coarsening.  No new consolidation, pleural effusion, or pneumothorax. Atherosclerotic calcification of the aorta. Enteric tube with tip in the body of the stomach. Persistent dilatation of small-bowel loops measuring up to approximately 6 cm in caliber. Bilateral ureteral stents noted. There is lumbar fusion. IMPRESSION: 1. Right IJ central venous line with tip in the region of the cavoatrial junction. No pneumothorax. 2. Enteric tube with tip in the body of the stomach. Persistent small bowel obstruction. Electronically Signed   By: Anner Crete M.D.   On: 11/03/2021 19:31   DG Chest Port 1 View  Result Date: 11/10/2021 CLINICAL DATA:  Suspected abdominal sepsis EXAM: PORTABLE CHEST 1 VIEW COMPARISON:  Prior chest x-ray 11/24/2021 FINDINGS: Right IJ central venous catheter. Catheter tip overlies the right atrium. Gastric tube is present. The tip lies within the stomach. Stable cardiac and mediastinal contours. Atherosclerotic calcifications present in the transverse aorta. Inspiratory volumes are lower. Increasing perihilar streaky airspace opacities. Pulmonary vascular congestion bordering on mild edema. Small bilateral layering pleural  effusions. No pneumothorax. No acute osseous abnormality. IMPRESSION: 1. Lower inspiratory volumes with increasing perihilar and bibasilar atelectasis. 2. Pulmonary vascular congestion with borderline interstitial edema. 3. Small bilateral layering pleural effusions. 4. Stable and satisfactory support apparatus. Electronically Signed   By: Jacqulynn Cadet M.D.   On: 11/10/2021 06:25   DG CHEST PORT 1 VIEW  Result Date: 11/04/2021 CLINICAL DATA:  Suspected sepsis. Small-bowel obstruction seen on prior CT. EXAM: PORTABLE CHEST 1 VIEW COMPARISON:  Chest radiograph dated 11/04/2021 and CT dated 11/15/2021 FINDINGS: Right IJ central venous line with tip in the region of the cavoatrial junction. There is mild cardiomegaly with mild vascular congestion. Diffuse chronic interstitial coarsening. No new consolidation, pleural effusion, or pneumothorax. Atherosclerotic calcification of the aorta. Enteric tube with tip in the body of the stomach. Persistent dilatation of small-bowel loops measuring up to approximately 6 cm in caliber. Bilateral ureteral stents noted. There is lumbar fusion. IMPRESSION: 1. Right IJ central venous line with tip in the region of the cavoatrial junction. No pneumothorax. 2. Enteric tube with tip in the body of the stomach. Persistent small bowel obstruction. Electronically Signed   By: Anner Crete M.D.   On: 11/16/2021 19:31   DG Chest Port 1 View  Result Date: 11/24/2021 CLINICAL DATA:  Questionable sepsis EXAM: PORTABLE CHEST 1 VIEW COMPARISON:  10/24/2021 FINDINGS: Stable interstitial coarsening with interstitial lung disease by June 2022 CT. There is no edema, consolidation, effusion, or pneumothorax. Normal heart size and mediastinal contours. IMPRESSION: Interstitial lung disease.  No acute superimposed finding. Electronically Signed   By: Jorje Guild M.D.   On: 11/25/2021 10:44   DG Abd Portable 1V  Result Date: 11/07/2021 CLINICAL DATA:  Encounter for nasogastric  tube EXAM: PORTABLE ABDOMEN - 1 VIEW COMPARISON:  Same day CT FINDINGS: Nasogastric tube courses down the left mainstem/lower lobe bronchus. Multiple dilated loops of small bowel in the abdomen partially visualized. No acute osseous abnormality. Partially visualized lumbar spine fusion hardware. Partially visualized nephroureteral stents. IMPRESSION: Nasogastric tube is malpositioned within the left mainstem/lower lobe bronchus. Recommend removal. Small bowel obstruction. Critical Value/emergent results were called by telephone at the time of interpretation on 11/11/2021 at 3:29 pm to provider [facility] , who verbally acknowledged these results. Electronically Signed   By: Maurine Simmering M.D.   On: 11/27/2021 15:30   ECHOCARDIOGRAM COMPLETE  Result Date: 11/10/2021    ECHOCARDIOGRAM REPORT   Patient Name:   William [name] Date of Exam: 11/10/2021 Medical  Rec #:  [id number]      Height:       67.0 in Accession #:    [id number]     Weight:       179.2 lb Date of Birth:  [birth year]      BSA:          1.930 m Patient Age:    69 years       BP:           108/63 mmHg Patient Gender: M              HR:           111 bpm. Exam Location:  Inpatient Procedure: 3D Echo, 2D Echo, Cardiac Doppler and Color Doppler Indications:    R94.31 Abnormal EKG  History:        Patient has prior history of Echocardiogram examinations, most                 recent 02/14/2017. Abnormal ECG, COPD, Arrythmias:Tachycardia;                 Signs/Symptoms:Shortness of Breath, Dyspnea, Syncope,                 Dizziness/Lightheadedness and Hypotension. Septic shock. Cancer.  Sonographer:    Roseanna Rainbow RDCS Referring Phys: [id number] Donita Brooks  Sonographer Comments: Technically difficult study due to poor echo windows. IMPRESSIONS  1. Left ventricular ejection fraction, by estimation, is 55 to 60%. The left ventricle has normal function. The left ventricle has no regional wall motion abnormalities. There is mild left ventricular hypertrophy.  Left ventricular diastolic parameters are consistent with Grade I diastolic dysfunction (impaired relaxation).  2. Right ventricular systolic function is normal. The right ventricular size is normal. There is normal pulmonary artery systolic pressure.  3. The mitral valve is normal in structure. No evidence of mitral valve regurgitation.  4. The aortic valve is normal in structure. Aortic valve regurgitation is not visualized.  5. Aortic small ascending aortic aneurysm 3.8 cm.  6. The inferior vena cava is normal in size with greater than 50% respiratory variability, suggesting right atrial pressure of 3 mmHg. Comparison(s): No prior Echocardiogram. FINDINGS  Left Ventricle: Left ventricular ejection fraction, by estimation, is 55 to 60%. The left ventricle has normal function. The left ventricle has no regional wall motion abnormalities. The left ventricular internal cavity size was normal in size. There is  mild left ventricular hypertrophy. Left ventricular diastolic parameters are consistent with Grade I diastolic dysfunction (impaired relaxation). Right Ventricle: The right ventricular size is normal. No increase in right ventricular wall thickness. Right ventricular systolic function is normal. There is normal pulmonary artery systolic pressure. The tricuspid regurgitant velocity is 2.30 m/s, and  with an assumed right atrial pressure of 3 mmHg, the estimated right ventricular systolic pressure is 25.6 mmHg. Left Atrium: Left atrial size was normal in size. Right Atrium: Right atrial size was normal in size. Pericardium: There is no evidence of pericardial effusion. Mitral Valve: The mitral valve is normal in structure. No evidence of mitral valve regurgitation. Tricuspid Valve: The tricuspid valve is normal in structure. Tricuspid valve regurgitation is trivial. Aortic Valve: The aortic valve is normal in structure. Aortic valve regurgitation is not visualized. Pulmonic Valve: The pulmonic valve was not well  visualized. Pulmonic valve regurgitation is not visualized. Aorta: Small ascending aortic aneurysm 3.8 cm. Venous: The inferior vena cava is normal in size with greater than 50% respiratory variability, suggesting  right atrial pressure of 3 mmHg. IAS/Shunts: No atrial level shunt detected by color flow Doppler.  LEFT VENTRICLE PLAX 2D LVIDd:         4.20 cm     Diastology LVIDs:         2.90 cm     LV e' medial:    8.49 cm/s LV PW:         1.10 cm     LV E/e' medial:  7.0 LV IVS:        1.10 cm     LV e' lateral:   9.46 cm/s LVOT diam:     2.30 cm     LV E/e' lateral: 6.3 LV SV:         106 LV SV Index:   55 LVOT Area:     4.15 cm                             3D Volume EF: LV Volumes (MOD)           3D EF:        69 % LV vol d, MOD A2C: 40.1 ml LV EDV:       114 ml LV vol d, MOD A4C: 49.1 ml LV ESV:       35 ml LV vol s, MOD A2C: 15.4 ml LV SV:        79 ml LV vol s, MOD A4C: 26.2 ml LV SV MOD A2C:     24.7 ml LV SV MOD A4C:     49.1 ml LV SV MOD BP:      24.2 ml RIGHT VENTRICLE             IVC RV S prime:     18.50 cm/s  IVC diam: 1.60 cm TAPSE (M-mode): 2.5 cm LEFT ATRIUM             Index        RIGHT ATRIUM           Index LA diam:        3.20 cm 1.66 cm/m   RA Area:     14.30 cm LA Vol (A2C):   22.4 ml 11.61 ml/m  RA Volume:   35.70 ml  18.50 ml/m LA Vol (A4C):   46.7 ml 24.19 ml/m LA Biplane Vol: 33.6 ml 17.41 ml/m  AORTIC VALVE              PULMONIC VALVE LVOT Vmax:   164.00 cm/s  PR End Diast Vel: 1.54 msec LVOT Vmean:  113.000 cm/s LVOT VTI:    0.256 m  AORTA Ao Root diam: 3.70 cm Ao Asc diam:  3.80 cm MITRAL VALVE               TRICUSPID VALVE MV Area (PHT): 4.49 cm    TR Peak grad:   21.2 mmHg MV Decel Time: 169 msec    TR Vmax:        230.00 cm/s MV E velocity: 59.60 cm/s MV A velocity: 74.10 cm/s  SHUNTS MV E/A ratio:  0.80        Systemic VTI:  0.26 m                            Systemic Diam: 2.30 cm Phineas Inches Electronically signed by Phineas Inches Signature Date/Time: 11/10/2021/3:22:03 PM     Final  CT CHEST ABDOMEN PELVIS WO CONTRAST  Result Date: 11/23/2021 CLINICAL DATA:  Hypotension, shock EXAM: CT CHEST, ABDOMEN AND PELVIS WITHOUT CONTRAST TECHNIQUE: Multidetector CT imaging of the chest, abdomen and pelvis was performed following the standard protocol without IV contrast. COMPARISON:  CT abdomen and pelvis 06/09/2021 FINDINGS: CT CHEST FINDINGS Cardiovascular: Heart size is normal. No pericardial effusion identified. Coronary artery calcifications noted. Main pulmonary artery is normal caliber. Note is made of a small amount of air in the brachiocephalic vein. Thoracic aorta is normal in caliber with mild calcified plaques. Mediastinum/Nodes: No bulky axillary, mediastinal or hilar lymphadenopathy identified. Lungs/Pleura: Lungs are hyperinflated with mild emphysematous changes. There is mild interlobular septal thickening which is new since previous study. Dependent subsegmental atelectatic changes bilaterally. No focal consolidation visualized. No significant pleural effusion. No pneumothorax. Musculoskeletal: No suspicious bony lesions identified in the chest. Small foci of subcutaneous or vascular air identified in the right upper chest region as well as in the subcutaneous plane in the lower chest extending to the abdomen. CT ABDOMEN PELVIS FINDINGS Hepatobiliary: No focal liver abnormality is seen. No gallstones, gallbladder wall thickening, or biliary dilatation. Pancreas: Unremarkable. No pancreatic ductal dilatation or surrounding inflammatory changes. Spleen: Normal in size without focal abnormality. Adrenals/Urinary Tract: Adrenal glands appear normal. Recent cystoprostatectomy surgical changes. No nephrolithiasis or hydronephrosis identified bilaterally. Bilateral ureteral stents which terminate distally through an ostomy in the right abdomen. Stomach/Bowel: Limited evaluation of bowel due to lack of contrast. There are multiple loops of distended small bowel with air-fluid  levels, mostly in the left abdomen which measure up to 4.9 cm in diameter. Accompanying mesenteric engorgement/fat stranding. No pneumatosis identified. Distal loops of ileum are decompressed. Postsurgical changes in the right lower quadrant. Multiple foci of extraluminal air visualized in the right pelvis and a few in the upper abdomen. Retained fecal material in the colon and rectum. Vascular/Lymphatic: Aortic atherosclerosis. No enlarged abdominal or pelvic lymph nodes. Reproductive: Prostate gland surgically absent. Other: Subcutaneous emphysema identified throughout the anterior abdomen, pelvis and anterior aspect of the visualized proximal lower extremities. No associated defined fluid collections. Small to moderate volume ascites mostly in the pelvis and in the left abdomen Musculoskeletal: Degenerative and postsurgical changes of the lumbar spine. No suspicious bony lesions visualized. IMPRESSION: 1. Findings concerning for small bowel obstruction, with proximal loops of small bowel measuring up to 4.9 cm in diameter with air-fluid levels. Possible transition point in the lower abdomen/pelvis near a surgical anastomosis. 2. Small to moderate volume ascites mostly located in the pelvis and left abdomen. 3. Small amount of free intraperitoneal air, mostly in the right pelvis surrounded by ascites. May be postoperative in nature, correlate clinically and follow-up recommended. 4. Small to moderate amount of subcutaneous emphysema mostly in the anterior abdominal and pelvic walls. No accompanying defined fluid collections or significant adjacent inflammatory fat stranding. 5. Cystoprostatectomy changes with right-sided urinary ostomy. Bilateral ureteral stents extending through the ostomy. No hydronephrosis. 6. Mild interstitial pulmonary edema. Emphysematous and subsegmental atelectatic changes. Discussed with Dr. Regenia Skeeter over the telephone at 1:09 p.m. on 11/02/2021 with read back. Electronically Signed   By:  Ofilia Neas M.D.   On: 11/18/2021 13:26        Scheduled Meds:  chlorhexidine  15 mL Mouth Rinse BID   Chlorhexidine Gluconate Cloth  6 each Topical Daily   fluticasone  2 spray Each Nare Daily   heparin  5,000 Units Subcutaneous Q8H   lip balm  1 application Topical BID  mouth rinse  15 mL Mouth Rinse q12n4p   sodium chloride flush  10-40 mL Intracatheter Q12H   thiamine injection  100 mg Intravenous Daily   umeclidinium bromide  1 puff Inhalation Daily   Continuous Infusions:  sodium chloride 250 mL (11/12/2021 1205)   cefTRIAXone (ROCEPHIN)  IV 2 g (11/11/21 0819)   dextrose 5% lactated ringers 75 mL/hr at 11/11/21 0800   lactated ringers     ondansetron (ZOFRAN) IV       LOS: 2 days    Time spent: 46 minutes spent on chart review, discussion with nursing staff, consultants, updating family and interview/physical exam; more than 50% of that time was spent in counseling and/or coordination of care.    Denni France J [place] DO [facility] Available via Epic secure chat 7am-7pm After these hours, please refer to coverage provider listed on [email or web address] 11/11/2021, 9:42 AM

## 2021-11-12 ENCOUNTER — Inpatient Hospital Stay (HOSPITAL_COMMUNITY): Payer: Medicare HMO

## 2021-11-12 DIAGNOSIS — A419 Sepsis, unspecified organism: Secondary | ICD-10-CM | POA: Diagnosis not present

## 2021-11-12 DIAGNOSIS — R6521 Severe sepsis with septic shock: Secondary | ICD-10-CM | POA: Diagnosis not present

## 2021-11-12 LAB — BASIC METABOLIC PANEL
Anion gap: 10 (ref 5–15)
BUN: 26 mg/dL — ABNORMAL HIGH (ref 8–23)
CO2: 27 mmol/L (ref 22–32)
Calcium: 7.9 mg/dL — ABNORMAL LOW (ref 8.9–10.3)
Chloride: 105 mmol/L (ref 98–111)
Creatinine, Ser: 1.04 mg/dL (ref 0.61–1.24)
GFR, Estimated: >60 mL/min (ref >60)
Glucose, Bld: 105 mg/dL — ABNORMAL HIGH (ref 70–99)
Potassium: 3.8 mmol/L (ref 3.5–5.1)
Sodium: 142 mmol/L (ref 135–145)

## 2021-11-12 LAB — CULTURE, BLOOD (ROUTINE X 2)
Special Requests: ADEQUATE
Special Requests: ADEQUATE

## 2021-11-12 LAB — CBC
HCT: 29.2 % — ABNORMAL LOW (ref 39.0–52.0)
Hemoglobin: 9.4 g/dL — ABNORMAL LOW (ref 13.0–17.0)
MCH: 29.2 pg (ref 26.0–34.0)
MCHC: 32.2 g/dL (ref 30.0–36.0)
MCV: 90.7 fL (ref 80.0–100.0)
Platelets: 256 10*3/uL (ref 150–400)
RBC: 3.22 MIL/uL — ABNORMAL LOW (ref 4.22–5.81)
RDW: 15 % (ref 11.5–15.5)
WBC: 21.5 10*3/uL — ABNORMAL HIGH (ref 4.0–10.5)
nRBC: 0.2 % (ref 0.0–0.2)

## 2021-11-12 LAB — URINE CULTURE: Culture: >=100000 — AB

## 2021-11-12 LAB — GLUCOSE, CAPILLARY: Glucose-Capillary: 100 mg/dL — ABNORMAL HIGH (ref 70–99)

## 2021-11-12 LAB — PHOSPHORUS: Phosphorus: 3 mg/dL (ref 2.5–4.6)

## 2021-11-12 LAB — MAGNESIUM: Magnesium: 2.4 mg/dL (ref 1.7–2.4)

## 2021-11-12 MED ORDER — SODIUM CHLORIDE 0.9 % IV SOLN
3.0000 g | Freq: Four times a day (QID) | INTRAVENOUS | Status: DC
  Administered 2021-11-12 – 2021-11-17 (×21): 3 g via INTRAVENOUS
  Filled 2021-11-12 (×23): qty 8

## 2021-11-12 NOTE — Plan of Care (Signed)

## 2021-11-12 NOTE — Progress Notes (Addendum)
Pharmacy Antibiotic Note  William [name] is a 80 y.o. male admitted on 11/26/2021 with sepsis.  Pharmacy was originally consulted for vancomycin and cefepime dosing > Ceftriaxone with Kleb pneumo BCID > Unasyn with addition of E faecalis to urine culture  Plan: Unasyn 3gm q6h  Height: 5\' 7"  (170.2 cm) Weight: 80 kg (176 lb 5.9 oz) IBW/kg (Calculated) : 66.1  Temp (24hrs), Avg:98.4 F (36.9 C), Min:97.6 F (36.4 C), Max:99 F (37.2 C)  Recent Labs  Lab 11/14/2021 1016 11/23/2021 1029 11/23/2021 1216 11/26/2021 1749 11/10/21 0500 11/10/21 0514 11/11/21 0542 11/12/21 0319  WBC 7.0  --   --   --   --  12.5* 18.6* 21.5*  CREATININE 2.87* 3.00*  --  2.17* 1.67*  --  1.21 1.04  LATICACIDVEN 4.6*  --  4.5* 4.8*  --   --   --   --      Estimated Creatinine Clearance: 58.4 mL/min (by C-G formula based on SCr of 1.04 mg/dL).    Allergies  Allergen Reactions   Hydrocodone Other (See Comments)    Causes constipation even with stool softner    Antimicrobials this admission: 11/10 Vanc/Flagyl x1 11/10 Cefepime >> 11/11 11/11 CTX >> 11/12 11/13 Unasyn  Dose adjustments this admission:  Microbiology results: 11/10 BCx: 3/3 GNR (BCID + Klebsiella oxytoca) 11/10 UCx:  Klebsiella oxytoca + E faecalis  Thank you for allowing pharmacy to be a part of this patient's care.  Minda Ditto PharmD [facility] [phone] 11/12/2021, 7:16 AM

## 2021-11-12 NOTE — Progress Notes (Signed)
Subjective/Chief Complaint: He reports his abdominal discomfort is about the same Reports passing some flatus Not much out of tiny NG tube   Objective: Vital signs in last 24 hours: Temp:  [97.7 F (36.5 C)-99 F (37.2 C)] 98.7 F (37.1 C) (11/13 0358) Pulse Rate:  [95-110] 106 (11/13 0358) Resp:  [12-18] 17 (11/13 0358) BP: (114-128)/(67-89) 123/73 (11/13 0358) SpO2:  [93 %-99 %] 93 % (11/13 0358) Weight:  [80 kg] 80 kg (11/13 0500) Last BM Date: 11/04/21  Intake/Output from previous day: 11/12 0701 - 11/13 0700 In: 1944 [P.O.:150; I.V.:1694; IV Piggyback:100] Out: 1930 [Urine:1750; Emesis/NG output:180] Intake/Output this shift: No intake/output data recorded.  Exam: Awake, follows commands Abdomen about the same as yesterday with tenderness and guarding. Incision is clean   Lab Results:  Recent Labs    11/11/21 0542 11/12/21 0319  WBC 18.6* 21.5*  HGB 8.8* 9.4*  HCT 27.5* 29.2*  PLT 281 256   BMET Recent Labs    11/11/21 0542 11/12/21 0319  NA 135 142  K 3.9 3.8  CL 103 105  CO2 26 27  GLUCOSE 89 105*  BUN 31* 26*  CREATININE 1.21 1.04  CALCIUM 7.5* 7.9*   PT/INR Recent Labs    11/19/2021 1016 11/10/21 0500  LABPROT 17.0* 17.8*  INR 1.4* 1.5*   ABG No results for input(s): PHART, HCO3 in the last 72 hours.  Invalid input(s): PCO2, PO2  Studies/Results: ECHOCARDIOGRAM COMPLETE  Result Date: 11/10/2021    ECHOCARDIOGRAM REPORT   Patient Name:   William [name] Date of Exam: 11/10/2021 Medical Rec #:  [id number]      Height:       67.0 in Accession #:    [id number]     Weight:       179.2 lb Date of Birth:  [birth year]      BSA:          1.930 m Patient Age:    32 years       BP:           108/63 mmHg Patient Gender: M              HR:           111 bpm. Exam Location:  Inpatient Procedure: 3D Echo, 2D Echo, Cardiac Doppler and Color Doppler Indications:    R94.31 Abnormal EKG  History:        Patient has prior history of Echocardiogram  examinations, most                 recent 02/14/2017. Abnormal ECG, COPD, Arrythmias:Tachycardia;                 Signs/Symptoms:Shortness of Breath, Dyspnea, Syncope,                 Dizziness/Lightheadedness and Hypotension. Septic shock. Cancer.  Sonographer:    Roseanna Rainbow RDCS Referring Phys: [id number] Donita Brooks  Sonographer Comments: Technically difficult study due to poor echo windows. IMPRESSIONS  1. Left ventricular ejection fraction, by estimation, is 55 to 60%. The left ventricle has normal function. The left ventricle has no regional wall motion abnormalities. There is mild left ventricular hypertrophy. Left ventricular diastolic parameters are consistent with Grade I diastolic dysfunction (impaired relaxation).  2. Right ventricular systolic function is normal. The right ventricular size is normal. There is normal pulmonary artery systolic pressure.  3. The mitral valve is normal in structure. No evidence of mitral valve regurgitation.  4.  The aortic valve is normal in structure. Aortic valve regurgitation is not visualized.  5. Aortic small ascending aortic aneurysm 3.8 cm.  6. The inferior vena cava is normal in size with greater than 50% respiratory variability, suggesting right atrial pressure of 3 mmHg. Comparison(s): No prior Echocardiogram. FINDINGS  Left Ventricle: Left ventricular ejection fraction, by estimation, is 55 to 60%. The left ventricle has normal function. The left ventricle has no regional wall motion abnormalities. The left ventricular internal cavity size was normal in size. There is  mild left ventricular hypertrophy. Left ventricular diastolic parameters are consistent with Grade I diastolic dysfunction (impaired relaxation). Right Ventricle: The right ventricular size is normal. No increase in right ventricular wall thickness. Right ventricular systolic function is normal. There is normal pulmonary artery systolic pressure. The tricuspid regurgitant velocity is 2.30 m/s, and   with an assumed right atrial pressure of 3 mmHg, the estimated right ventricular systolic pressure is 38.2 mmHg. Left Atrium: Left atrial size was normal in size. Right Atrium: Right atrial size was normal in size. Pericardium: There is no evidence of pericardial effusion. Mitral Valve: The mitral valve is normal in structure. No evidence of mitral valve regurgitation. Tricuspid Valve: The tricuspid valve is normal in structure. Tricuspid valve regurgitation is trivial. Aortic Valve: The aortic valve is normal in structure. Aortic valve regurgitation is not visualized. Pulmonic Valve: The pulmonic valve was not well visualized. Pulmonic valve regurgitation is not visualized. Aorta: Small ascending aortic aneurysm 3.8 cm. Venous: The inferior vena cava is normal in size with greater than 50% respiratory variability, suggesting right atrial pressure of 3 mmHg. IAS/Shunts: No atrial level shunt detected by color flow Doppler.  LEFT VENTRICLE PLAX 2D LVIDd:         4.20 cm     Diastology LVIDs:         2.90 cm     LV e' medial:    8.49 cm/s LV PW:         1.10 cm     LV E/e' medial:  7.0 LV IVS:        1.10 cm     LV e' lateral:   9.46 cm/s LVOT diam:     2.30 cm     LV E/e' lateral: 6.3 LV SV:         106 LV SV Index:   55 LVOT Area:     4.15 cm                             3D Volume EF: LV Volumes (MOD)           3D EF:        69 % LV vol d, MOD A2C: 40.1 ml LV EDV:       114 ml LV vol d, MOD A4C: 49.1 ml LV ESV:       35 ml LV vol s, MOD A2C: 15.4 ml LV SV:        79 ml LV vol s, MOD A4C: 26.2 ml LV SV MOD A2C:     24.7 ml LV SV MOD A4C:     49.1 ml LV SV MOD BP:      24.2 ml RIGHT VENTRICLE             IVC RV S prime:     18.50 cm/s  IVC diam: 1.60 cm TAPSE (M-mode): 2.5 cm LEFT ATRIUM  Index        RIGHT ATRIUM           Index LA diam:        3.20 cm 1.66 cm/m   RA Area:     14.30 cm LA Vol (A2C):   22.4 ml 11.61 ml/m  RA Volume:   35.70 ml  18.50 ml/m LA Vol (A4C):   46.7 ml 24.19 ml/m LA Biplane  Vol: 33.6 ml 17.41 ml/m  AORTIC VALVE              PULMONIC VALVE LVOT Vmax:   164.00 cm/s  PR End Diast Vel: 1.54 msec LVOT Vmean:  113.000 cm/s LVOT VTI:    0.256 m  AORTA Ao Root diam: 3.70 cm Ao Asc diam:  3.80 cm MITRAL VALVE               TRICUSPID VALVE MV Area (PHT): 4.49 cm    TR Peak grad:   21.2 mmHg MV Decel Time: 169 msec    TR Vmax:        230.00 cm/s MV E velocity: 59.60 cm/s MV A velocity: 74.10 cm/s  SHUNTS MV E/A ratio:  0.80        Systemic VTI:  0.26 m                            Systemic Diam: 2.30 cm Phineas Inches Electronically signed by Phineas Inches Signature Date/Time: 11/10/2021/3:22:03 PM    Final     Anti-infectives: Anti-infectives (From admission, onward)    Start     Dose/Rate Route Frequency Ordered Stop   11/12/21 0800  Ampicillin-Sulbactam (UNASYN) 3 g in sodium chloride 0.9 % 100 mL IVPB        3 g 200 mL/hr over 30 Minutes Intravenous Every 6 hours 11/12/21 0712     11/10/21 1400  cefTRIAXone (ROCEPHIN) 2 g in sodium chloride 0.9 % 100 mL IVPB  Status:  Discontinued        2 g 200 mL/hr over 30 Minutes Intravenous Daily 11/10/21 1048 11/12/21 0712   11/10/21 0800  ceFEPIme (MAXIPIME) 2 g in sodium chloride 0.9 % 100 mL IVPB  Status:  Discontinued        2 g 200 mL/hr over 30 Minutes Intravenous Every 12 hours 11/10/21 0618 11/10/21 1048   11/10/21 0630  ceFEPIme (MAXIPIME) 2 g in sodium chloride 0.9 % 100 mL IVPB  Status:  Discontinued        2 g 200 mL/hr over 30 Minutes Intravenous Every 24 hours 11/18/2021 1622 11/10/21 0618   11/08/2021 1624  vancomycin variable dose per unstable renal function (pharmacist dosing)  Status:  Discontinued         Does not apply See admin instructions 11/22/2021 1624 11/10/21 0613   11/02/2021 1030  ceFEPIme (MAXIPIME) 2 g in sodium chloride 0.9 % 100 mL IVPB        2 g 200 mL/hr over 30 Minutes Intravenous  Once 11/20/2021 1016 11/15/2021 1133   11/11/2021 1030  metroNIDAZOLE (FLAGYL) IVPB 500 mg        500 mg 100 mL/hr over 60 Minutes  Intravenous  Once 11/01/2021 1016 11/15/2021 1915   11/10/2021 1030  vancomycin (VANCOCIN) IVPB 1000 mg/200 mL premix  Status:  Discontinued        1,000 mg 200 mL/hr over 60 Minutes Intravenous  Once 11/07/2021 1016 11/20/2021 1023   11/15/2021 1030  vancomycin (  VANCOREADY) IVPB 1500 mg/300 mL        1,500 mg 150 mL/hr over 120 Minutes Intravenous  Once 11/24/2021 1023 11/26/2021 1341       Assessment/Plan: SBO  - s/p Robotic-assisted laparoscopic radical cystectomy, radical prostatectomy, Bilateral pelvic lymph node dissection, Extensive laparoscopic adhesiolysis, Small bowel resection, Ileal conduit urinary diversion 10/26/21 [name]  WBC up more.  Antibiotics just changed given blood and urine cultures Abd xray shows small bowel still small bowel dilation and gas in the colon as well consist with an ileus. Will follow.  If not improving, we may check the small bowel protocol    Coralie Keens 11/12/2021

## 2021-11-12 NOTE — Progress Notes (Signed)
PROGRESS NOTE    William [name]  CBU:[id number] DOB: October 24, 1941 DOA: 11/14/2021 PCP: [name], MD    Brief Narrative:  William [name] is a 80 year old male with past medical history significant for bladder cancer s/p robotic assisted laparoscopic radical cystectomy/prostatectomy, bilateral pelvic lymph node dissection, extensive laparoscopic adhesiolysis, small bowel resection with ileal conduit urinary diversion 10/26/21 by [name], COPD, history of tobacco use disorder who presented to [facility] ED on 11/10 from urology clinic with finding of low blood pressure.  Following recent discharge, patient has been dealing with constipation with poor oral intake with darker urine.  Also complaining of increasing left lower quadrant pain and intermittent fevers at home.  Reports that his ileal conduit has been functioning normally.  Pain worsened to the point where his pain medications were not effective.  EMS was called on 11/9 but was not transported and was instructed to add naproxen to his pain regimen which seemed to give some relief.  Patient was seen in follow-up in the urology clinic on 1111 was found to have blood pressure in the 03O systolic and was referred to the ED for further assessment.  Patient recent hospitalized and underwent robotic assisted laparoscopic radical cystectomy/prostatectomy with bilateral pelvic lymph node dissection, extensive laparoscopic adhesion anxiolysis, small bowel resection and ileal conduit urinary diversion on 10/25/2021 by [name] for his invasive bladder cancer; hospital course was largely uneventful and was discharged on postop day 8 11/02/2021.  In the ED, SBP's in the 60s, treated with IV fluids and started on Levophed for blood pressure support.  Sodium 131, potassium 5.2, chloride 98, CO2 21, glucose 88, BUN 43, creatinine 2.87, anion gap 12, lactic acid 4.6, WBC 7, hemoglobin 9.2, platelets 364 with left shift.  Urinalysis from ileal conduit with  100 protein, large leukocytes, greater than 50 WBCs and many bacteria.  CT chest/abdomen/pelvis were notable for findings concerning for underlying ILD, coronary artery calcification, concern for small bowel obstruction/possible transition point in the lower abdomen/pelvis near surgical anastomosis with small/moderate volume ascites in the pelvis/left abdomen, small amount of free intraperitoneal air (?post-op), small to moderate amount of subcutaneous emphysema without accompanying fluid collection/stranding, cystoprostatectomy changes with right-sided urinary ostomy, bilateral ureteral stents extending through the ostomy with no hydronephrosis.  Patient was admitted to the PCCM service due to septic shock from likely urinary source.  Care transferred from [facility] to hospital service on 11/11/2021.   Assessment & Plan:   Principal Problem:   Septic shock ([place]) Active Problems:   Chronic pain   Bladder cancer ([place])   Bacteremia due to Gram-negative bacteria   UTI due to Klebsiella species   Septic shock, POA Klebsiella oxytoca UTI/septicemia, POA Patient presenting to ED from urology clinic after being found with systolic blood pressures in the 50s.  Patient was given IV fluid resuscitation without improvement and eventually placed on Levophed for vasopressor support.  Patient met septic shock criteria on admission with hypotension not improved with IV fluid resuscitation requiring Levophed in the setting of endorgan damage with acute renal failure, lactic acidosis with source of infection urinary tract with septicemia.  Patient was started on empiric antibiotics initially with vancomycin, cefepime, metronidazole and was de-escalated to ceftriaxone based on cultures. --Now titrated off of vasopressors --WBC 7.0>12.5>18.6>21.5 --PCT 41.64>32.48>18.72 --Blood cultures x2: GNR's with BC ID Klebsiella oxytoca --Urine culture >100K Klebsiella oxytoca/Enterococcus faecalis --Start Unasyn 3g IV q6h  today with Enterococcus faecalis now in urine culture and rising white count --Awaiting further culture  susceptibilities --CBC daily  Small bowel obstruction CT abdomen/pelvis on admission with concern for small bowel obstruction with possible transition point in the lower abdomen/pelvis near surgical anastomosis site.  General surgery believes this is likely secondary to inflammatory process from recent abdominal surgery and not fibrous/adhesion in nature; and recommend conservative management with NG tube and bowel rest. --General surgery/urology following, appreciate assistance --NPO, NGT to LWIS, 168m out past 24h --Continue IVF with D5LR at 75 mL/h --KUB 11/13 w/ persistent gaseous dilation loops of small bowel left mid abdomen, distention large bowel c/w pSBO vs ileus --Further per general surgery/urology  Acute renal failure Etiology likely secondary to prolonged prerenal state from dehydration and poor oral intake versus ATN from hypotension/septic shock as above. --Cr 2.87>3.00>1.67>1.21>1.04 --IVF with D5LR at 75 mL/h --Avoid nephrotoxins, renal dose all medications --BMP daily  Invasive muscle bladder cancer s/p radical cystectomy, prostatectomy and ileal conduit Underwent robotic assisted laparoscopic radical cystectomy, robotic radical prostatectomy, bilateral pelvic lymph node dissection, extensive laparoscopic adhesiolysis, small bowel resection and ileal conduit urinary diversion on 10/25/2021 by Dr. MTresa Mooreof urology. --Urology following, appreciate assistance --Continue ostomy care; monitor urinary output  COPD History of tobacco abuse --Incruse Ellipta 1 puff daily --DuoNebs as needed --Currently on room air  Normocytic anemia 9.2>9.5>9.2>8.8>9.4, Stable --Transfuse for hemoglobin less than 7.0 or active bleeding --CBC daily  Suspected ILD Noted on CT imaging this admission.  History of tobacco abuse. --Flonase --Outpatient pulmonology follow-up  Chronic  back pain At baseline on gabapentin, oxycodone, Zanaflex. --Holding home oral medicines due to small bowel obstruction --Morphine 2-426mIV q4h PRN  Weakness/deconditioning/debility: --PT/OT evaluation pending  DVT prophylaxis: heparin injection 5,000 Units Start: 11/08/2021 1515 SCDs Start: 11/11/2021 1502   Code Status: Full Code Family Communication: No family present at bedside this morning  Disposition Plan:  Level of care: Med-Surg Status is: Inpatient  Remains inpatient appropriate because: Continues on IV antibiotics, awaiting culture identification/susceptibilities.  Pending PT/OT evaluation   Consultants:  PCCM - signed off 11/12 General surgery Urology  Procedures:  Central line placement 11/10  Antimicrobials:  Ceftriaxone 11/11 - 11/13 Vancomycin 11/10 - 11/11 Cefepime 11/10 - 11/11 Metronidazole 11/10 - 11/10 Unasyn 11/13>>   Subjective: Patient seen examined at bedside, resting comfortably.  Continues to feel poorly this morning with continued weakness/fatigue.  Denies flatus, no bowel movements.  Continues n.p.o. with NG tube in place.  Willing to work with physical therapy would like to get out of bed today.  No other specific questions or concerns at this time.  Denies headache, no chest pain, no palpitations, no shortness of breath, no abdominal pain, no current fever/chills/night sweats, no nausea/vomiting/diarrhea.  No acute concerns overnight per nursing staff.  Objective: Vitals:   11/12/21 0358 11/12/21 0500 11/12/21 0900 11/12/21 1016  BP: 123/73   (!) 127/51  Pulse: (!) 106   83  Resp: 17   18  Temp: 98.7 F (37.1 C)   98.2 F (36.8 C)  TempSrc:    Oral  SpO2: 93%  94% 94%  Weight:  80 kg    Height:        Intake/Output Summary (Last 24 hours) at 11/12/2021 1204 Last data filed at 11/12/2021 1106 Gross per 24 hour  Intake 1528.09 ml  Output 2020 ml  Net -491.91 ml   Filed Weights   11/10/21 0453 11/11/21 0500 11/12/21 0500  Weight:  81.3 kg 77.5 kg 80 kg    Examination:  General exam: Appears calm and comfortable, chronically ill  in appearance Respiratory system: Clear to auscultation. Respiratory effort normal.  On room air Cardiovascular system: S1 & S2 heard, RRR. No JVD, murmurs, rubs, gallops or clicks. No pedal edema. Gastrointestinal system: Abdomen is nondistended, soft, mild generalized tenderness to palpation. No organomegaly or masses felt.  No appreciable bowel sounds.  NG tube noted to suction, urostomy noted with yellow urine with sediment in collection bag Central nervous system: Alert and oriented. No focal neurological deficits. Extremities: Symmetric 5 x 5 power. Skin: No rashes, lesions or ulcers Psychiatry: Judgement and insight appear normal. Mood & affect appropriate.     Data Reviewed: I have personally reviewed following labs and imaging studies  CBC: Recent Labs  Lab 11/08/2021 1016 11/28/2021 1029 11/10/21 0514 11/11/21 0542 11/12/21 0319  WBC 7.0  --  12.5* 18.6* 21.5*  NEUTROABS 6.2  --   --   --   --   HGB 9.2* 9.5* 9.2* 8.8* 9.4*  HCT 28.2* 28.0* 28.1* 27.5* 29.2*  MCV 91.6  --  90.1 92.9 90.7  PLT 364  --  359 281 412   Basic Metabolic Panel: Recent Labs  Lab 11/11/2021 1016 11/01/2021 1029 11/15/2021 1749 11/10/21 0500 11/11/21 0542 11/12/21 0319  NA 131* 129* 131* 134* 135 142  K 5.2* 5.6* 4.6 4.6 3.9 3.8  CL 98 97* 101 100 103 105  CO2 21*  --  16* '23 26 27  ' GLUCOSE 88 86 75 87 89 105*  BUN 43* 49* 38* 35* 31* 26*  CREATININE 2.87* 3.00* 2.17* 1.67* 1.21 1.04  CALCIUM 7.5*  --  7.3* 7.4* 7.5* 7.9*  MG  --   --   --  2.0  --  2.4  PHOS  --   --   --  3.7  --  3.0   GFR: Estimated Creatinine Clearance: 58.4 mL/min (by C-G formula based on SCr of 1.04 mg/dL). Liver Function Tests: Recent Labs  Lab 11/20/2021 1016  AST 49*  ALT 21  ALKPHOS 57  BILITOT 1.0  PROT 5.4*  ALBUMIN 2.1*   No results for input(s): LIPASE, AMYLASE in the last 168 hours. No results for  input(s): AMMONIA in the last 168 hours. Coagulation Profile: Recent Labs  Lab 11/22/2021 1016 11/10/21 0500  INR 1.4* 1.5*   Cardiac Enzymes: No results for input(s): CKTOTAL, CKMB, CKMBINDEX, TROPONINI in the last 168 hours. BNP (last 3 results) No results for input(s): PROBNP in the last 8760 hours. HbA1C: No results for input(s): HGBA1C in the last 72 hours. CBG: Recent Labs  Lab 11/11/21 1236 11/11/21 1603 11/11/21 1954 11/11/21 2348 11/12/21 0358  GLUCAP 86 87 109* 109* 100*   Lipid Profile: No results for input(s): CHOL, HDL, LDLCALC, TRIG, CHOLHDL, LDLDIRECT in the last 72 hours. Thyroid Function Tests: No results for input(s): TSH, T4TOTAL, FREET4, T3FREE, THYROIDAB in the last 72 hours. Anemia Panel: No results for input(s): VITAMINB12, FOLATE, FERRITIN, TIBC, IRON, RETICCTPCT in the last 72 hours. Sepsis Labs: Recent Labs  Lab 11/28/2021 1016 11/07/2021 1216 11/07/2021 1749 11/10/21 0500 11/11/21 0542  PROCALCITON  --   --  41.64 32.48 18.72  LATICACIDVEN 4.6* 4.5* 4.8*  --   --     Recent Results (from the past 240 hour(s))  Blood Culture (routine x 2)     Status: Abnormal   Collection Time: 11/15/2021 10:16 AM   Specimen: BLOOD RIGHT ARM  Result Value Ref Range Status   Specimen Description BLOOD RIGHT ARM  Final   Special Requests  Final    BOTTLES DRAWN AEROBIC AND ANAEROBIC Blood Culture adequate volume   Culture  Setup Time   Final    GRAM NEGATIVE RODS IN BOTH AEROBIC AND ANAEROBIC BOTTLES CRITICAL RESULT CALLED TO, READ BACK BY AND VERIFIED WITH: M LILLISTON,PHARMD'@[id number]'  11/10/21 [place] Performed at [facility], [place] [address], [place], [place] [address]    Culture KLEBSIELLA OXYTOCA (A)  Final   Report Status 11/12/2021 FINAL  Final   Organism ID, Bacteria KLEBSIELLA OXYTOCA  Final      Susceptibility   Klebsiella oxytoca - MIC*    AMPICILLIN >=32 RESISTANT Resistant     CEFAZOLIN 8 SENSITIVE Sensitive     CEFEPIME <=0.12 SENSITIVE Sensitive      CEFTAZIDIME <=1 SENSITIVE Sensitive     CEFTRIAXONE <=0.25 SENSITIVE Sensitive     CIPROFLOXACIN <=0.25 SENSITIVE Sensitive     GENTAMICIN <=1 SENSITIVE Sensitive     IMIPENEM <=0.25 SENSITIVE Sensitive     TRIMETH/SULFA <=20 SENSITIVE Sensitive     AMPICILLIN/SULBACTAM 8 SENSITIVE Sensitive     PIP/TAZO <=4 SENSITIVE Sensitive     * KLEBSIELLA OXYTOCA  Urine Culture     Status: Abnormal (Preliminary result)   Collection Time: 11/26/2021 10:16 AM   Specimen: In/Out Cath Urine  Result Value Ref Range Status   Specimen Description   Final    IN/OUT CATH URINE Performed at [facility] [address] [place], [place] [address]    Special Requests   Final    NONE Performed at [facility], [place] [address] [place], [place] [address]    Culture (A)  Final    >=100,000 COLONIES/mL KLEBSIELLA OXYTOCA >=100,000 COLONIES/mL ENTEROCOCCUS FAECALIS SUSCEPTIBILITIES TO FOLLOW Performed at [facility], [place] [address] [place],  [address]    Report Status PENDING  Incomplete   Organism ID, Bacteria KLEBSIELLA OXYTOCA (A)  Final      Susceptibility   Klebsiella oxytoca - MIC*    AMPICILLIN >=32 RESISTANT Resistant     CEFAZOLIN 8 SENSITIVE Sensitive     CEFEPIME <=0.12 SENSITIVE Sensitive     CEFTRIAXONE <=0.25 SENSITIVE Sensitive     CIPROFLOXACIN <=0.25 SENSITIVE Sensitive     GENTAMICIN <=1 SENSITIVE Sensitive     IMIPENEM <=0.25 SENSITIVE Sensitive     NITROFURANTOIN 32 SENSITIVE Sensitive     TRIMETH/SULFA <=20 SENSITIVE Sensitive     AMPICILLIN/SULBACTAM 8 SENSITIVE Sensitive     PIP/TAZO <=4 SENSITIVE Sensitive     * >=100,000 COLONIES/mL KLEBSIELLA OXYTOCA  Blood Culture ID Panel (Reflexed)     Status: Abnormal   Collection Time: 10/31/2021 10:16 AM  Result Value Ref Range Status   Enterococcus faecalis NOT DETECTED NOT DETECTED Final   Enterococcus Faecium NOT DETECTED NOT DETECTED Final   Listeria monocytogenes NOT  DETECTED NOT DETECTED Final   Staphylococcus species NOT DETECTED NOT DETECTED Final   Staphylococcus aureus (BCID) NOT DETECTED NOT DETECTED Final   Staphylococcus epidermidis NOT DETECTED NOT DETECTED Final   Staphylococcus lugdunensis NOT DETECTED NOT DETECTED Final   Streptococcus species NOT DETECTED NOT DETECTED Final   Streptococcus agalactiae NOT DETECTED NOT DETECTED Final   Streptococcus pneumoniae NOT DETECTED NOT DETECTED Final   Streptococcus pyogenes NOT DETECTED NOT DETECTED Final   A.calcoaceticus-baumannii NOT DETECTED NOT DETECTED Final   Bacteroides fragilis NOT DETECTED NOT DETECTED Final   Enterobacterales DETECTED (A) NOT DETECTED Final    Comment: Enterobacterales represent a large order of gram negative bacteria, not  a single organism. CRITICAL RESULT CALLED TO, READ BACK BY AND VERIFIED WITH: M LILLISTON,PHARMD'@0544'  11/10/21 [place]    Enterobacter cloacae complex NOT DETECTED NOT DETECTED Final   Escherichia coli NOT DETECTED NOT DETECTED Final   Klebsiella aerogenes NOT DETECTED NOT DETECTED Final   Klebsiella oxytoca DETECTED (A) NOT DETECTED Final    Comment: CRITICAL RESULT CALLED TO, READ BACK BY AND VERIFIED WITH: M LILLISTON,PHARMD'@0544'  11/10/21 [place]    Klebsiella pneumoniae NOT DETECTED NOT DETECTED Final   Proteus species NOT DETECTED NOT DETECTED Final   Salmonella species NOT DETECTED NOT DETECTED Final   Serratia marcescens NOT DETECTED NOT DETECTED Final   Haemophilus influenzae NOT DETECTED NOT DETECTED Final   Neisseria meningitidis NOT DETECTED NOT DETECTED Final   Pseudomonas aeruginosa NOT DETECTED NOT DETECTED Final   Stenotrophomonas maltophilia NOT DETECTED NOT DETECTED Final   Candida albicans NOT DETECTED NOT DETECTED Final   Candida auris NOT DETECTED NOT DETECTED Final   Candida glabrata NOT DETECTED NOT DETECTED Final   Candida krusei NOT DETECTED NOT DETECTED Final   Candida parapsilosis NOT DETECTED NOT DETECTED Final   Candida  tropicalis NOT DETECTED NOT DETECTED Final   Cryptococcus neoformans/gattii NOT DETECTED NOT DETECTED Final   CTX-M ESBL NOT DETECTED NOT DETECTED Final   Carbapenem resistance IMP NOT DETECTED NOT DETECTED Final   Carbapenem resistance KPC NOT DETECTED NOT DETECTED Final   Carbapenem resistance NDM NOT DETECTED NOT DETECTED Final   Carbapenem resist OXA 48 LIKE NOT DETECTED NOT DETECTED Final   Carbapenem resistance VIM NOT DETECTED NOT DETECTED Final    Comment: Performed at [facility], [address] [place], [place] [address]  Blood Culture (routine x 2)     Status: Abnormal   Collection Time: 11/20/2021 10:21 AM   Specimen: BLOOD RIGHT WRIST  Result Value Ref Range Status   Specimen Description   Final    BLOOD RIGHT WRIST Performed at [facility], [address] [place], [place] [address]    Special Requests   Final    BOTTLES DRAWN AEROBIC ONLY Blood Culture adequate volume Performed at [facility] [address] [place], [place] [address]    Culture  Setup Time   Final    GRAM NEGATIVE RODS AEROBIC BOTTLE ONLY CRITICAL VALUE NOTED.  VALUE IS CONSISTENT WITH PREVIOUSLY REPORTED AND CALLED VALUE.    Culture (A)  Final    KLEBSIELLA OXYTOCA SUSCEPTIBILITIES PERFORMED ON PREVIOUS CULTURE WITHIN THE LAST 5 DAYS. Performed at [facility], [place] [address] [place], [place] [address]    Report Status 11/12/2021 FINAL  Final  Resp Panel by RT-PCR (Flu A&B, Covid) Nasopharyngeal Swab     Status: None   Collection Time: 11/22/2021 11:18 AM   Specimen: Nasopharyngeal Swab; Nasopharyngeal(NP) swabs in vial transport medium  Result Value Ref Range Status   SARS Coronavirus 2 by RT PCR NEGATIVE NEGATIVE Final    Comment: (NOTE) SARS-CoV-2 target nucleic acids are NOT DETECTED.  The SARS-CoV-2 RNA is generally detectable in upper respiratory specimens during the acute phase of infection. The lowest concentration of SARS-CoV-2 viral copies  this assay can detect is 138 copies/mL. A negative result does not preclude SARS-Cov-2 infection and should not be used as the sole basis for treatment or other patient management decisions. A negative result may occur with  improper specimen collection/handling, submission of specimen other than nasopharyngeal swab, presence of viral mutation(s) within the areas targeted by this assay,  and inadequate number of viral copies(<138 copies/mL). A negative result must be combined with clinical observations, patient history, and epidemiological information. The expected result is Negative.  Fact Sheet for Patients:  EntrepreneurPulse.com.au  Fact Sheet for Healthcare Providers:  IncredibleEmployment.be  This test is no t yet approved or cleared by the [place] FDA and  has been authorized for detection and/or diagnosis of SARS-CoV-2 by FDA under an Emergency Use Authorization (EUA). This EUA will remain  in effect (meaning this test can be used) for the duration of the COVID-19 declaration under Section 564(b)(1) of the Act, 21 U.S.C.section 360bbb-3(b)(1), unless the authorization is terminated  or revoked sooner.       Influenza A by PCR NEGATIVE NEGATIVE Final   Influenza B by PCR NEGATIVE NEGATIVE Final    Comment: (NOTE) The Xpert Xpress SARS-CoV-2/FLU/RSV plus assay is intended as an aid in the diagnosis of influenza from Nasopharyngeal swab specimens and should not be used as a sole basis for treatment. Nasal washings and aspirates are unacceptable for Xpert Xpress SARS-CoV-2/FLU/RSV testing.  Fact Sheet for Patients: EntrepreneurPulse.com.au  Fact Sheet for Healthcare Providers: IncredibleEmployment.be  This test is not yet approved or cleared by the [place] FDA and has been authorized for detection and/or diagnosis of SARS-CoV-2 by FDA under an Emergency Use Authorization (EUA). This EUA  will remain in effect (meaning this test can be used) for the duration of the COVID-19 declaration under Section 564(b)(1) of the Act, 21 U.S.C. section 360bbb-3(b)(1), unless the authorization is terminated or revoked.  Performed at [facility], [place] [address], [place], [place] [address]          Radiology Studies: DG Abd Portable 1V  Result Date: 11/12/2021 CLINICAL DATA:  Small bowel obstruction EXAM: PORTABLE ABDOMEN - 1 VIEW COMPARISON:  November tenth 2022 FINDINGS: There is gaseous distension of loops of bowel throughout the abdomen. Air is seen in the rectum. There is persistent gaseous dilation of several loops of small bowel in the LEFT mid abdomen, similar in comparison to prior. Bilateral nephroureteral stents. Enteric tube tip and side port project over the stomach. Bibasilar heterogeneous opacities with a likely small LEFT pleural effusion. Status post posterior fixation of the lumbar spine. IMPRESSION: 1. Persistent gaseous dilation of several loops of small bowel in the LEFT mid abdomen. There is gaseous distension of loops of large bowel. Constellation of findings may reflect partial small bowel obstruction versus ileus. Electronically Signed   By: Valentino Saxon M.D.   On: 11/12/2021 09:56        Scheduled Meds:  chlorhexidine  15 mL Mouth Rinse BID   Chlorhexidine Gluconate Cloth  6 each Topical Daily   fluticasone  2 spray Each Nare Daily   heparin  5,000 Units Subcutaneous Q8H   lip balm  1 application Topical BID   mouth rinse  15 mL Mouth Rinse q12n4p   sodium chloride flush  10-40 mL Intracatheter Q12H   thiamine injection  100 mg Intravenous Daily   umeclidinium bromide  1 puff Inhalation Daily   Continuous Infusions:  sodium chloride 250 mL (11/12/21 0852)   ampicillin-sulbactam (UNASYN) IV 3 g (11/12/21 0858)   dextrose 5% lactated ringers 75 mL/hr at 11/12/21 0502   ondansetron (ZOFRAN) IV       LOS: 3 days    Time  spent: 48 minutes spent on chart review, discussion with nursing staff, consultants, updating family and interview/physical exam; more than 50% of that time was spent  in counseling and/or coordination of care.    Chaston Bradburn J [place] DO [facility] Available via Epic secure chat 7am-7pm After these hours, please refer to coverage provider listed on [email or web address] 11/12/2021, 12:04 PM

## 2021-11-12 NOTE — Plan of Care (Signed)

## 2021-11-12 NOTE — Evaluation (Signed)
Occupational Therapy Evaluation Patient Details Name: William [name] MRN: [id number] DOB: Aug 02, 1941 Today's Date: 11/12/2021   History of Present Illness Patient  is a 80 year old male who presented from urology [facility] on 11/10 with low blood pressure. patient was found to have septic shock. PMH: s/p robotic cystoprostatectomy, pelvic node dissection , conduit diversion, extensive adhesioloysis , small bowel resection and cysto / ICG injection on 10/25/21 due to bladder cancer, tinnitus, Covid-19, back surgery   Clinical Impression   Patient is a 80 year old male who was admitted for above. Patient was living at home with wife prior level. Currently, patient is mod A x2 for transfers with max A for LB dressing tasks. Patient would continue to benefit from skilled OT services at this time while admitted and after d/c to address noted deficits in order to improve overall safety and independence in ADLs.        Recommendations for follow up therapy are one component of a multi-disciplinary discharge planning process, led by the attending physician.  Recommendations may be updated based on patient status, additional functional criteria and insurance authorization.   Follow Up Recommendations  Home health OT (pending level of assistance available at home)    Assistance Recommended at Discharge Frequent or constant Supervision/Assistance  Functional Status Assessment  Patient has had a recent decline in their functional status and demonstrates the ability to make significant improvements in function in a reasonable and predictable amount of time.  Equipment Recommendations  Other (comment) (TBD)    Recommendations for Other Services       Precautions / Restrictions Precautions Precautions: Fall Precaution Comments: iliostomy, NG tube, O2, Restrictions Weight Bearing Restrictions: No      Mobility Bed Mobility Overal bed mobility: Needs Assistance Bed Mobility: Supine to Sit;Sit to  Supine     Supine to sit: Mod assist     General bed mobility comments: increased time and assistance with education on log rolling to reduce pressure on abdomen. patient reported pain in L hip with movement.    Transfers Overall transfer level: Needs assistance Equipment used: Rolling walker (2 wheels) Transfers: Sit to/from Stand;Bed to chair/wheelchair/BSC Sit to Stand: Mod assist;+2 physical assistance;+2 safety/equipment;From elevated surface Stand pivot transfers: Min assist;+2 physical assistance;+2 safety/equipment         General transfer comment: patient needed increased time and physical assist to advance walker and maintain standing balance.      Balance Overall balance assessment: Needs assistance Sitting-balance support: Feet supported Sitting balance-Leahy Scale: Fair     Standing balance support: Bilateral upper extremity supported;Reliant on assistive device for balance Standing balance-Leahy Scale: Poor                             ADL either performed or assessed with clinical judgement   ADL Overall ADL's : Needs assistance/impaired Eating/Feeding: NPO   Grooming: Wash/dry face;Set up;Sitting Grooming Details (indicate cue type and reason): EOB Upper Body Bathing: Minimal assistance;Bed level   Lower Body Bathing: Maximal assistance;Bed level   Upper Body Dressing : Bed level;Minimal assistance   Lower Body Dressing: Bed level;Maximal assistance   Toilet Transfer: +2 for safety/equipment;Moderate assistance Toilet Transfer Details (indicate cue type and reason): patient was able to transfer from edge of bed to recliner with +2 for safetty with reports of pain in LLE. Toileting- Water quality scientist and Hygiene: Sit to/from stand;Total assistance  Vision Patient Visual Report: No change from baseline       Perception     Praxis      Pertinent Vitals/Pain Pain Assessment: Faces Faces Pain Scale: Hurts little  more Pain Location: all over Pain Descriptors / Indicators: Discomfort Pain Intervention(s): Monitored during session     Hand Dominance Right   Extremity/Trunk Assessment Upper Extremity Assessment Upper Extremity Assessment: Generalized weakness;RUE deficits/detail;LUE deficits/detail RUE Deficits / Details: limited ROM assessment with NG tube in the way. patient was able to place BUE up on walker to participate in transfer with no limitations in ROM up to 90 degrees.       Cervical / Trunk Assessment Cervical / Trunk Assessment: Normal   Communication Communication Communication: HOH   Cognition Arousal/Alertness: Awake/alert Behavior During Therapy: WFL for tasks assessed/performed Overall Cognitive Status: Within Functional Limits for tasks assessed                                       General Comments       Exercises     Shoulder Instructions      Home Living Family/patient expects to be discharged to:: Private residence Living Arrangements: Spouse/significant other;Children Available Help at Discharge: Family Type of Home: House Home Access: Stairs to enter Technical brewer of Steps: 2 Entrance Stairs-Rails: None Home Layout: One level     Bathroom Shower/Tub: Corporate investment banker: Standard     Home Equipment: Cane - single point;Rollator (4 wheels)          Prior Functioning/Environment Prior Level of Function : Independent/Modified Independent               ADLs Comments: patient unable to answer questions about specifics at home. does indicate being weaker at this time        OT Problem List: Decreased strength;Decreased activity tolerance;Impaired vision/perception;Impaired balance (sitting and/or standing);Decreased knowledge of precautions;Decreased knowledge of use of DME or AE;Decreased safety awareness;Pain      OT Treatment/Interventions: Self-care/ADL training;Therapeutic  exercise;Neuromuscular education;Energy conservation;DME and/or AE instruction;Balance training;Patient/family education;Therapeutic activities    OT Goals(Current goals can be found in the care plan section) Acute Rehab OT Goals Patient Stated Goal: to get lips wet OT Goal Formulation: With patient Time For Goal Achievement: 11/26/21 Potential to Achieve Goals: Good  OT Frequency: Min 2X/week   Barriers to D/C:            Co-evaluation              AM-PAC OT "6 Clicks" Daily Activity     Outcome Measure Help from another person eating meals?: Total (NPO) Help from another person taking care of personal grooming?: A Little Help from another person toileting, which includes using toliet, bedpan, or urinal?: A Lot Help from another person bathing (including washing, rinsing, drying)?: A Lot Help from another person to put on and taking off regular upper body clothing?: A Little Help from another person to put on and taking off regular lower body clothing?: A Lot 6 Click Score: 13   End of Session Equipment Utilized During Treatment: Gait belt;Rolling walker (2 wheels) Nurse Communication: Other (comment) (patients dry mouth)  Activity Tolerance: Patient tolerated treatment well;Patient limited by fatigue Patient left: in chair;with call bell/phone within reach;with nursing/sitter in room;with chair alarm set  Time: 2081-3887 OT Time Calculation (min): 28 min Charges:  OT General Charges $OT Visit: 1 Visit OT Evaluation $OT Eval Low Complexity: 1 Low OT Treatments $Self Care/Home Management : 8-22 mins  Jackelyn Poling OTR/L, MS Acute Rehabilitation Department Office# [phone] Pager# [phone]   [place] 11/12/2021, 12:47 PM

## 2021-11-12 NOTE — Evaluation (Signed)
Physical Therapy Evaluation Patient Details Name: William [name] MRN: [id number] DOB: Jun 12, 1941 Today's Date: 11/12/2021  History of Present Illness  Patient  is a 80 year old male who presented from urology clinic on 11/10 with low blood pressure. patient was found to have septic shock. PMH: s/p robotic cystoprostatectomy, pelvic node dissection , conduit diversion, extensive adhesioloysis , small bowel resection and cysto / ICG injection on 10/25/21 due to bladder cancer, tinnitus, Covid-19, back surgery  Clinical Impression  Pt admitted with above diagnosis. Pt currently with functional limitations due to the deficits listed below (see PT Problem List). Pt will benefit from skilled PT to increase their independence and safety with mobility to allow discharge to the venue listed below.  Pt assisted with ambulating in hallway short distance.  Pt on 4L O2 in room however [place] not in place and SPO2 91%.  Pt ambulated on 2L O2 [place] and SPO2 96% during ambulation and then 92% upon returning to recliner (RN aware pt left on 2L).         Recommendations for follow up therapy are one component of a multi-disciplinary discharge planning process, led by the attending physician.  Recommendations may be updated based on patient status, additional functional criteria and insurance authorization.  Follow Up Recommendations Home health PT    Assistance Recommended at Discharge Set up Supervision/Assistance  Functional Status Assessment Patient has had a recent decline in their functional status and demonstrates the ability to make significant improvements in function in a reasonable and predictable amount of time.  Equipment Recommendations  None recommended by PT    Recommendations for Other Services       Precautions / Restrictions Precautions Precautions: Fall Precaution Comments: urostomy, NG tube Restrictions Weight Bearing Restrictions: No      Mobility  Bed Mobility Overal bed mobility:  Needs Assistance Bed Mobility: Supine to Sit;Sit to Supine     Supine to sit: Mod assist     General bed mobility comments: pt in recliner    Transfers Overall transfer level: Needs assistance Equipment used: Rolling walker (2 wheels) Transfers: Sit to/from Stand Sit to Stand: Min assist Stand pivot transfers: Min assist;+2 physical assistance;+2 safety/equipment         General transfer comment: verbal cues for hand placement, light assist to rise    Ambulation/Gait Ambulation/Gait assistance: Min guard Gait Distance (Feet): 70 Feet Assistive device: Rolling walker (2 wheels) Gait Pattern/deviations: Step-through pattern;Decreased stride length;Trunk flexed Gait velocity: decreased     General Gait Details: verbal cues for posture, HR up to 140 bpm and SPO2 96% on 2L O2 [place] (RN notified); distance to tolerance  Stairs            Wheelchair Mobility    Modified Rankin (Stroke Patients Only)       Balance Overall balance assessment: Needs assistance Sitting-balance support: Feet supported Sitting balance-Leahy Scale: Fair     Standing balance support: Bilateral upper extremity supported;Reliant on assistive device for balance Standing balance-Leahy Scale: Poor                               Pertinent Vitals/Pain Pain Assessment: Faces Faces Pain Scale: Hurts little more Pain Location: groin area Pain Descriptors / Indicators: Discomfort Pain Intervention(s): Monitored during session;Repositioned    Home Living Family/patient expects to be discharged to:: Private residence Living Arrangements: Spouse/significant other;Children Available Help at Discharge: Family Type of Home: House Home Access: Stairs  to enter Entrance Stairs-Rails: None Entrance Stairs-Number of Steps: 2   Home Layout: One level Home Equipment: Cane - single point;Rollator (4 wheels)      Prior Function Prior Level of Function : Independent/Modified Independent              Mobility Comments: reports not doing well at home since previous admission ADLs Comments: patient unable to answer questions about specifics at home. does indicate being weaker at this time     Hand Dominance   Dominant Hand: Right    Extremity/Trunk Assessment   Upper Extremity Assessment Upper Extremity Assessment: Generalized weakness;RUE deficits/detail;LUE deficits/detail RUE Deficits / Details: limited ROM assessment with NG tube in the way. patient was able to place BUE up on walker to participate in transfer with no limitations in ROM up to 90 degrees.    Lower Extremity Assessment Lower Extremity Assessment: Generalized weakness    Cervical / Trunk Assessment Cervical / Trunk Assessment: Normal  Communication   Communication: HOH  Cognition Arousal/Alertness: Awake/alert Behavior During Therapy: WFL for tasks assessed/performed Overall Cognitive Status: Within Functional Limits for tasks assessed                                          General Comments      Exercises     Assessment/Plan    PT Assessment Patient needs continued PT services  PT Problem List Decreased balance;Decreased activity tolerance;Decreased strength;Decreased mobility;Decreased knowledge of use of DME       PT Treatment Interventions Gait training;DME instruction;Therapeutic exercise;Balance training;Functional mobility training;Therapeutic activities;Patient/family education    PT Goals (Current goals can be found in the Care Plan section)  Acute Rehab PT Goals Time For Goal Achievement: 11/26/21 Potential to Achieve Goals: Good    Frequency Min 3X/week   Barriers to discharge        Co-evaluation               AM-PAC PT "6 Clicks" Mobility  Outcome Measure Help needed turning from your back to your side while in a flat bed without using bedrails?: A Little Help needed moving from lying on your back to sitting on the side of a flat bed  without using bedrails?: A Little Help needed moving to and from a bed to a chair (including a wheelchair)?: A Little Help needed standing up from a chair using your arms (e.g., wheelchair or bedside chair)?: A Little Help needed to walk in hospital room?: A Little Help needed climbing 3-5 steps with a railing? : A Little 6 Click Score: 18    End of Session Equipment Utilized During Treatment: Gait belt;Oxygen Activity Tolerance: Patient tolerated treatment well Patient left: with call bell/phone within reach;with family/visitor present;in chair;Other (comment)   PT Visit Diagnosis: Difficulty in walking, not elsewhere classified (R26.2)    Time: 1655-3748 PT Time Calculation (min) (ACUTE ONLY): 22 min   Charges:   PT Evaluation $PT Eval Low Complexity: 1 Low        Kati PT, DPT Acute Rehabilitation Services Pager: [phone] Office: [place] 11/12/2021, 2:21 PM

## 2021-11-12 NOTE — Progress Notes (Signed)
Subjective: Patient reports still feeling pretty bad.  No worsening of abdominal pain.  He is still passing flatus.  Objective: Vital signs in last 24 hours: Temp:  [97.7 F (36.5 C)-99 F (37.2 C)] 98.7 F (37.1 C) (11/13 0358) Pulse Rate:  [95-110] 106 (11/13 0358) Resp:  [12-18] 17 (11/13 0358) BP: (114-128)/(67-89) 123/73 (11/13 0358) SpO2:  [93 %-99 %] 94 % (11/13 0900) Weight:  [80 kg] 80 kg (11/13 0500)  Intake/Output from previous day: 11/12 0701 - 11/13 0700 In: 1944 [P.O.:150; I.V.:1694; IV Piggyback:100] Out: 1930 [Urine:1750; Emesis/NG output:180] Intake/Output this shift: No intake/output data recorded.  Physical Exam:  Constitutional: Vital signs reviewed. WD WN in NAD   Eyes: PERRL, No scleral icterus.   Cardiovascular: RRR Pulmonary/Chest: Normal effort Abdominal: Moderately distended, tympanitic.  Ostomy pink/viable.  Incision looks fine. Extremities: No cyanosis or edema   Lab Results: Recent Labs    11/10/21 0514 11/11/21 0542 11/12/21 0319  HGB 9.2* 8.8* 9.4*  HCT 28.1* 27.5* 29.2*   BMET Recent Labs    11/11/21 0542 11/12/21 0319  NA 135 142  K 3.9 3.8  CL 103 105  CO2 26 27  GLUCOSE 89 105*  BUN 31* 26*  CREATININE 1.21 1.04  CALCIUM 7.5* 7.9*   Recent Labs    11/18/2021 1016 11/10/21 0500  INR 1.4* 1.5*   No results for input(s): LABURIN in the last 72 hours. Results for orders placed or performed during the hospital encounter of 11/20/2021  Blood Culture (routine x 2)     Status: Abnormal   Collection Time: 11/19/2021 10:16 AM   Specimen: BLOOD RIGHT ARM  Result Value Ref Range Status   Specimen Description BLOOD RIGHT ARM  Final   Special Requests   Final    BOTTLES DRAWN AEROBIC AND ANAEROBIC Blood Culture adequate volume   Culture  Setup Time   Final    GRAM NEGATIVE RODS IN BOTH AEROBIC AND ANAEROBIC BOTTLES CRITICAL RESULT CALLED TO, READ BACK BY AND VERIFIED WITH: M LILLISTON,PHARMD@[id number]  11/10/21 [place] Performed at [facility], [place] [address], [place], [place] [address]    Culture KLEBSIELLA OXYTOCA (A)  Final   Report Status 11/12/2021 FINAL  Final   Organism ID, Bacteria KLEBSIELLA OXYTOCA  Final      Susceptibility   Klebsiella oxytoca - MIC*    AMPICILLIN >=32 RESISTANT Resistant     CEFAZOLIN 8 SENSITIVE Sensitive     CEFEPIME <=0.12 SENSITIVE Sensitive     CEFTAZIDIME <=1 SENSITIVE Sensitive     CEFTRIAXONE <=0.25 SENSITIVE Sensitive     CIPROFLOXACIN <=0.25 SENSITIVE Sensitive     GENTAMICIN <=1 SENSITIVE Sensitive     IMIPENEM <=0.25 SENSITIVE Sensitive     TRIMETH/SULFA <=20 SENSITIVE Sensitive     AMPICILLIN/SULBACTAM 8 SENSITIVE Sensitive     PIP/TAZO <=4 SENSITIVE Sensitive     * KLEBSIELLA OXYTOCA  Urine Culture     Status: Abnormal (Preliminary result)   Collection Time: 11/25/2021 10:16 AM   Specimen: In/Out Cath Urine  Result Value Ref Range Status   Specimen Description   Final    IN/OUT CATH URINE Performed at [place] [address], [place], [place] [address]    Special Requests   Final    NONE Performed at [facility], [place] [address], [place],  [address]    Culture (A)  Final    >=100,000 COLONIES/mL KLEBSIELLA OXYTOCA >=100,000 COLONIES/mL ENTEROCOCCUS FAECALIS SUSCEPTIBILITIES TO FOLLOW Performed at [facility], [place] [address] [place], [place] [address]    Report Status PENDING  Incomplete   Organism ID, Bacteria KLEBSIELLA OXYTOCA (A)  Final      Susceptibility   Klebsiella oxytoca - MIC*    AMPICILLIN >=32 RESISTANT Resistant     CEFAZOLIN 8 SENSITIVE Sensitive     CEFEPIME <=0.12 SENSITIVE Sensitive     CEFTRIAXONE <=0.25 SENSITIVE Sensitive     CIPROFLOXACIN <=0.25 SENSITIVE Sensitive     GENTAMICIN <=1 SENSITIVE Sensitive     IMIPENEM <=0.25 SENSITIVE Sensitive     NITROFURANTOIN 32 SENSITIVE Sensitive     TRIMETH/SULFA <=20 SENSITIVE Sensitive     AMPICILLIN/SULBACTAM 8 SENSITIVE Sensitive      PIP/TAZO <=4 SENSITIVE Sensitive     * >=100,000 COLONIES/mL KLEBSIELLA OXYTOCA  Blood Culture ID Panel (Reflexed)     Status: Abnormal   Collection Time: 11/28/2021 10:16 AM  Result Value Ref Range Status   Enterococcus faecalis NOT DETECTED NOT DETECTED Final   Enterococcus Faecium NOT DETECTED NOT DETECTED Final   Listeria monocytogenes NOT DETECTED NOT DETECTED Final   Staphylococcus species NOT DETECTED NOT DETECTED Final   Staphylococcus aureus (BCID) NOT DETECTED NOT DETECTED Final   Staphylococcus epidermidis NOT DETECTED NOT DETECTED Final   Staphylococcus lugdunensis NOT DETECTED NOT DETECTED Final   Streptococcus species NOT DETECTED NOT DETECTED Final   Streptococcus agalactiae NOT DETECTED NOT DETECTED Final   Streptococcus pneumoniae NOT DETECTED NOT DETECTED Final   Streptococcus pyogenes NOT DETECTED NOT DETECTED Final   A.calcoaceticus-baumannii NOT DETECTED NOT DETECTED Final   Bacteroides fragilis NOT DETECTED NOT DETECTED Final   Enterobacterales DETECTED (A) NOT DETECTED Final    Comment: Enterobacterales represent a large order of gram negative bacteria, not a single organism. CRITICAL RESULT CALLED TO, READ BACK BY AND VERIFIED WITH: M LILLISTON,PHARMD@[id number]  11/10/21 [place]    Enterobacter cloacae complex NOT DETECTED NOT DETECTED Final   Escherichia coli NOT DETECTED NOT DETECTED Final   Klebsiella aerogenes NOT DETECTED NOT DETECTED Final   Klebsiella oxytoca DETECTED (A) NOT DETECTED Final    Comment: CRITICAL RESULT CALLED TO, READ BACK BY AND VERIFIED WITH: M LILLISTON,PHARMD@[id number]  11/10/21 [place]    Klebsiella pneumoniae NOT DETECTED NOT DETECTED Final   Proteus species NOT DETECTED NOT DETECTED Final   Salmonella species NOT DETECTED NOT DETECTED Final   Serratia marcescens NOT DETECTED NOT DETECTED Final   Haemophilus influenzae NOT DETECTED NOT DETECTED Final   Neisseria meningitidis NOT DETECTED NOT DETECTED Final   Pseudomonas aeruginosa NOT DETECTED NOT  DETECTED Final   Stenotrophomonas maltophilia NOT DETECTED NOT DETECTED Final   Candida albicans NOT DETECTED NOT DETECTED Final   Candida auris NOT DETECTED NOT DETECTED Final   Candida glabrata NOT DETECTED NOT DETECTED Final   Candida krusei NOT DETECTED NOT DETECTED Final   Candida parapsilosis NOT DETECTED NOT DETECTED Final   Candida tropicalis NOT DETECTED NOT DETECTED Final   Cryptococcus neoformans/gattii NOT DETECTED NOT DETECTED Final   CTX-M ESBL NOT DETECTED NOT DETECTED Final   Carbapenem resistance IMP NOT DETECTED NOT DETECTED Final   Carbapenem resistance KPC NOT DETECTED NOT DETECTED Final   Carbapenem resistance NDM NOT DETECTED NOT DETECTED Final   Carbapenem resist OXA 48 LIKE NOT DETECTED NOT DETECTED Final   Carbapenem resistance VIM NOT DETECTED NOT DETECTED Final    Comment: Performed at [facility], [address] [place], [place] [address]  Blood Culture (routine x 2)     Status:  Abnormal   Collection Time: 11/07/2021 10:21 AM   Specimen: BLOOD RIGHT WRIST  Result Value Ref Range Status   Specimen Description   Final    BLOOD RIGHT WRIST Performed at [facility], [address] [place], [place] [address]    Special Requests   Final    BOTTLES DRAWN AEROBIC ONLY Blood Culture adequate volume Performed at [place] [address], [place], [place] [address]    Culture  Setup Time   Final    GRAM NEGATIVE RODS AEROBIC BOTTLE ONLY CRITICAL VALUE NOTED.  VALUE IS CONSISTENT WITH PREVIOUSLY REPORTED AND CALLED VALUE.    Culture (A)  Final    KLEBSIELLA OXYTOCA SUSCEPTIBILITIES PERFORMED ON PREVIOUS CULTURE WITHIN THE LAST 5 DAYS. Performed at [facility], [place] [address] [place], [place] [address]    Report Status 11/12/2021 FINAL  Final  Resp Panel by RT-PCR (Flu A&B, Covid) Nasopharyngeal Swab     Status: None   Collection Time: 11/28/2021 11:18 AM   Specimen: Nasopharyngeal Swab; Nasopharyngeal(NP) swabs in  vial transport medium  Result Value Ref Range Status   SARS Coronavirus 2 by RT PCR NEGATIVE NEGATIVE Final    Comment: (NOTE) SARS-CoV-2 target nucleic acids are NOT DETECTED.  The SARS-CoV-2 RNA is generally detectable in upper respiratory specimens during the acute phase of infection. The lowest concentration of SARS-CoV-2 viral copies this assay can detect is 138 copies/mL. A negative result does not preclude SARS-Cov-2 infection and should not be used as the sole basis for treatment or other patient management decisions. A negative result may occur with  improper specimen collection/handling, submission of specimen other than nasopharyngeal swab, presence of viral mutation(s) within the areas targeted by this assay, and inadequate number of viral copies(<138 copies/mL). A negative result must be combined with clinical observations, patient history, and epidemiological information. The expected result is Negative.  Fact Sheet for Patients:  EntrepreneurPulse.com.au  Fact Sheet for Healthcare Providers:  IncredibleEmployment.be  This test is no t yet approved or cleared by the [place] FDA and  has been authorized for detection and/or diagnosis of SARS-CoV-2 by FDA under an Emergency Use Authorization (EUA). This EUA will remain  in effect (meaning this test can be used) for the duration of the COVID-19 declaration under Section 564(b)(1) of the Act, 21 U.S.C.section 360bbb-3(b)(1), unless the authorization is terminated  or revoked sooner.       Influenza A by PCR NEGATIVE NEGATIVE Final   Influenza B by PCR NEGATIVE NEGATIVE Final    Comment: (NOTE) The Xpert Xpress SARS-CoV-2/FLU/RSV plus assay is intended as an aid in the diagnosis of influenza from Nasopharyngeal swab specimens and should not be used as a sole basis for treatment. Nasal washings and aspirates are unacceptable for Xpert Xpress SARS-CoV-2/FLU/RSV testing.  Fact  Sheet for Patients: EntrepreneurPulse.com.au  Fact Sheet for Healthcare Providers: IncredibleEmployment.be  This test is not yet approved or cleared by the [place] FDA and has been authorized for detection and/or diagnosis of SARS-CoV-2 by FDA under an Emergency Use Authorization (EUA). This EUA will remain in effect (meaning this test can be used) for the duration of the COVID-19 declaration under Section 564(b)(1) of the Act, 21 U.S.C. section 360bbb-3(b)(1), unless the authorization is terminated or revoked.  Performed at [facility], [place] [address], [place], [place] [address]     Studies/Results: ECHOCARDIOGRAM COMPLETE  Result Date: 11/10/2021    ECHOCARDIOGRAM REPORT   Patient Name:  Ludger Nutting Date of Exam: 11/10/2021 Medical Rec #:  [id number]      Height:       67.0 in Accession #:    [id number]     Weight:       179.2 lb Date of Birth:  [birth year]      BSA:          1.930 m Patient Age:    80 years       BP:           108/63 mmHg Patient Gender: M              HR:           111 bpm. Exam Location:  Inpatient Procedure: 3D Echo, 2D Echo, Cardiac Doppler and Color Doppler Indications:    R94.31 Abnormal EKG  History:        Patient has prior history of Echocardiogram examinations, most                 recent 02/14/2017. Abnormal ECG, COPD, Arrythmias:Tachycardia;                 Signs/Symptoms:Shortness of Breath, Dyspnea, Syncope,                 Dizziness/Lightheadedness and Hypotension. Septic shock. Cancer.  Sonographer:    Roseanna Rainbow RDCS Referring Phys: [id number] Donita Brooks  Sonographer Comments: Technically difficult study due to poor echo windows. IMPRESSIONS  1. Left ventricular ejection fraction, by estimation, is 55 to 60%. The left ventricle has normal function. The left ventricle has no regional wall motion abnormalities. There is mild left ventricular hypertrophy. Left ventricular diastolic parameters are  consistent with Grade I diastolic dysfunction (impaired relaxation).  2. Right ventricular systolic function is normal. The right ventricular size is normal. There is normal pulmonary artery systolic pressure.  3. The mitral valve is normal in structure. No evidence of mitral valve regurgitation.  4. The aortic valve is normal in structure. Aortic valve regurgitation is not visualized.  5. Aortic small ascending aortic aneurysm 3.8 cm.  6. The inferior vena cava is normal in size with greater than 50% respiratory variability, suggesting right atrial pressure of 3 mmHg. Comparison(s): No prior Echocardiogram. FINDINGS  Left Ventricle: Left ventricular ejection fraction, by estimation, is 55 to 60%. The left ventricle has normal function. The left ventricle has no regional wall motion abnormalities. The left ventricular internal cavity size was normal in size. There is  mild left ventricular hypertrophy. Left ventricular diastolic parameters are consistent with Grade I diastolic dysfunction (impaired relaxation). Right Ventricle: The right ventricular size is normal. No increase in right ventricular wall thickness. Right ventricular systolic function is normal. There is normal pulmonary artery systolic pressure. The tricuspid regurgitant velocity is 2.30 m/s, and  with an assumed right atrial pressure of 3 mmHg, the estimated right ventricular systolic pressure is 13.0 mmHg. Left Atrium: Left atrial size was normal in size. Right Atrium: Right atrial size was normal in size. Pericardium: There is no evidence of pericardial effusion. Mitral Valve: The mitral valve is normal in structure. No evidence of mitral valve regurgitation. Tricuspid Valve: The tricuspid valve is normal in structure. Tricuspid valve regurgitation is trivial. Aortic Valve: The aortic valve is normal in structure. Aortic valve regurgitation is not visualized. Pulmonic Valve: The pulmonic valve was not well visualized. Pulmonic valve regurgitation  is not visualized. Aorta: Small ascending aortic aneurysm 3.8 cm. Venous: The inferior vena cava is normal in  size with greater than 50% respiratory variability, suggesting right atrial pressure of 3 mmHg. IAS/Shunts: No atrial level shunt detected by color flow Doppler.  LEFT VENTRICLE PLAX 2D LVIDd:         4.20 cm     Diastology LVIDs:         2.90 cm     LV e' medial:    8.49 cm/s LV PW:         1.10 cm     LV E/e' medial:  7.0 LV IVS:        1.10 cm     LV e' lateral:   9.46 cm/s LVOT diam:     2.30 cm     LV E/e' lateral: 6.3 LV SV:         106 LV SV Index:   55 LVOT Area:     4.15 cm                             3D Volume EF: LV Volumes (MOD)           3D EF:        69 % LV vol d, MOD A2C: 40.1 ml LV EDV:       114 ml LV vol d, MOD A4C: 49.1 ml LV ESV:       35 ml LV vol s, MOD A2C: 15.4 ml LV SV:        79 ml LV vol s, MOD A4C: 26.2 ml LV SV MOD A2C:     24.7 ml LV SV MOD A4C:     49.1 ml LV SV MOD BP:      24.2 ml RIGHT VENTRICLE             IVC RV S prime:     18.50 cm/s  IVC diam: 1.60 cm TAPSE (M-mode): 2.5 cm LEFT ATRIUM             Index        RIGHT ATRIUM           Index LA diam:        3.20 cm 1.66 cm/m   RA Area:     14.30 cm LA Vol (A2C):   22.4 ml 11.61 ml/m  RA Volume:   35.70 ml  18.50 ml/m LA Vol (A4C):   46.7 ml 24.19 ml/m LA Biplane Vol: 33.6 ml 17.41 ml/m  AORTIC VALVE              PULMONIC VALVE LVOT Vmax:   164.00 cm/s  PR End Diast Vel: 1.54 msec LVOT Vmean:  113.000 cm/s LVOT VTI:    0.256 m  AORTA Ao Root diam: 3.70 cm Ao Asc diam:  3.80 cm MITRAL VALVE               TRICUSPID VALVE MV Area (PHT): 4.49 cm    TR Peak grad:   21.2 mmHg MV Decel Time: 169 msec    TR Vmax:        230.00 cm/s MV E velocity: 59.60 cm/s MV A velocity: 74.10 cm/s  SHUNTS MV E/A ratio:  0.80        Systemic VTI:  0.26 m                            Systemic Diam: 2.30 cm Phineas Inches Electronically signed by Phineas Inches Signature Date/Time:  11/10/2021/3:22:03 PM    Final     Assessment/Plan:  3 weeks  out from radical cystectomy/lysis of multiple adhesions.  Readmitted for sepsis.  Overall condition improving although white blood cell count still on upward trend.  Renal function improved.  Antibiotics adjusted for resistant organism today.  At this point, from general surgery standpoint no need for surgical intervention.  We will continue to follow.   LOS: 3 days   Jorja Loa 11/12/2021, 9:56 AM

## 2021-11-13 DIAGNOSIS — R6521 Severe sepsis with septic shock: Secondary | ICD-10-CM | POA: Diagnosis not present

## 2021-11-13 DIAGNOSIS — A419 Sepsis, unspecified organism: Secondary | ICD-10-CM | POA: Diagnosis not present

## 2021-11-13 LAB — BASIC METABOLIC PANEL
Anion gap: 8 (ref 5–15)
BUN: 23 mg/dL (ref 8–23)
CO2: 29 mmol/L (ref 22–32)
Calcium: 7.7 mg/dL — ABNORMAL LOW (ref 8.9–10.3)
Chloride: 108 mmol/L (ref 98–111)
Creatinine, Ser: 0.9 mg/dL (ref 0.61–1.24)
GFR, Estimated: >60 mL/min (ref >60)
Glucose, Bld: 119 mg/dL — ABNORMAL HIGH (ref 70–99)
Potassium: 3.5 mmol/L (ref 3.5–5.1)
Sodium: 145 mmol/L (ref 135–145)

## 2021-11-13 LAB — CBC
HCT: 28.8 % — ABNORMAL LOW (ref 39.0–52.0)
Hemoglobin: 9.2 g/dL — ABNORMAL LOW (ref 13.0–17.0)
MCH: 29.4 pg (ref 26.0–34.0)
MCHC: 31.9 g/dL (ref 30.0–36.0)
MCV: 92 fL (ref 80.0–100.0)
Platelets: 266 10*3/uL (ref 150–400)
RBC: 3.13 MIL/uL — ABNORMAL LOW (ref 4.22–5.81)
RDW: 15.2 % (ref 11.5–15.5)
WBC: 19.3 10*3/uL — ABNORMAL HIGH (ref 4.0–10.5)
nRBC: 0.2 % (ref 0.0–0.2)

## 2021-11-13 LAB — GLUCOSE, CAPILLARY: Glucose-Capillary: 99 mg/dL (ref 70–99)

## 2021-11-13 LAB — PROCALCITONIN: Procalcitonin: 4.67 ng/mL

## 2021-11-13 NOTE — Progress Notes (Signed)
Patient just ambulated in the hallway with PT. Patient requested to get out of bed, change positions, and walk with PT. Heart rate monitored during PT session in the hallway and patient ran tachy while walking with PT in the hallway. Dr. [place] MD was made aware of patient's elevated heart rate. Patient had an EKG done on Saturday, 11/11/21, which also resulted in sinus tach. Dr. [place] MD aware of EKG results from Saturday. No new orders from Dr. [place] MD other than wanting the patient to get out of bed and walk. Dr. [place] MD stated that the patient's elevated heart rate is coming from severe deconditioning not only from hospitalization but from having surgery the week prior. Dr. [place] MD anticipates that patient will continue to have bouts of sinus tach when he exerts himself until his mobility improves. Dr. [place] MD stated that patient needs to get up and move out of the bed to the chair at least twice a day. HR of 119 was not taken at a resting state. When at resting state, heart rate was at 80 which kept him at a green MEWS. Will continue to monitor patient.     11/12/21 1337  Assess: MEWS Score  Temp 99 F (37.2 C)  BP 133/79  Pulse Rate (!) 119  Resp 18  Level of Consciousness Alert  SpO2 97 %  O2 Device Nasal Cannula  Assess: MEWS Score  MEWS Temp 0  MEWS Systolic 0  MEWS Pulse 2  MEWS RR 0  MEWS LOC 0  MEWS Score 2  MEWS Score Color Yellow  Assess: if the MEWS score is Yellow or Red  Were vital signs taken at a resting state? No (Pt had just got up to walk in the hall with physical therapy and sat up in the chair.)  Focused Assessment No change from prior assessment (Pt runs tachy at times)  Does the patient meet 2 or more of the SIRS criteria? No  MEWS guidelines implemented *See Row Information* No, vital signs rechecked (HR went down to 80 after pt sat down to rest from PT session.)  Treat  MEWS Interventions Other (Comment) (Have pt rest after PT session)   Pain Scale 0-10  Pain Score 4  Pain Type Acute pain  Pain Location Abdomen  Pain Orientation Right;Left;Mid  Pain Descriptors / Indicators Constant;Discomfort  Pain Frequency Constant  Pain Onset With Activity  Patients Stated Pain Goal 2  Pain Intervention(s) Rest  Take Vital Signs  Increase Vital Sign Frequency   (Pt a Green MEWS after re-checking HR when pt was at rest. Yellow MEWS not implemented.)  Escalate  MEWS: Escalate  (Pt a Green MEWS after re-checking HR when pt was at rest. Yellow MEWS not implemented.)  Notify: Charge Nurse/RN  Name of Charge Nurse/RN Notified  (Pt a Green MEWS after re-checking HR when pt was at rest. Yellow MEWS not implemented.)  Date Charge Nurse/RN Notified  (Pt a Green MEWS after re-checking HR when pt was at rest. Yellow MEWS not implemented.)  Time Charge Nurse/RN Notified  (Pt a Green MEWS after re-checking HR when pt was at rest. Yellow MEWS not implemented.)  Notify: Provider  Provider Name/Title Dr. Eric [place] MD  Date Provider Notified 11/12/21  Time Provider Notified 1345  Notification Type Page  Notification Reason Other (Comment) (Elevated HR during exertion/walking with PT)  Provider response No new orders  Date of Provider Response 11/12/21  Time of Provider Response 1358  Notify: Rapid Response  Name  of Rapid Response RN Notified  (Pt a Green MEWS after re-checking HR when pt was at rest. Yellow MEWS not implemented.)  Date Rapid Response Notified  (Pt a Green MEWS after re-checking HR when pt was at rest. Yellow MEWS not implemented.)  Time Rapid Response Notified  (Pt a Green MEWS after re-checking HR when pt was at rest. Yellow MEWS not implemented.)  Document  Patient Outcome Stabilized after interventions (Stablized after patient sat down in chair to rest after PT session)  Progress note created (see row info) Yes  Assess: SIRS CRITERIA  SIRS Temperature  0  SIRS Pulse 1  SIRS Respirations  0  SIRS WBC 0   SIRS Score Sum  1

## 2021-11-13 NOTE — Progress Notes (Signed)
PROGRESS NOTE    William [name]  MBT:[id number] DOB: 1941/04/30 DOA: 11/14/2021 PCP: [name], MD    Brief Narrative:  William [name] is a 80 year old male with past medical history significant for bladder cancer s/p robotic assisted laparoscopic radical cystectomy/prostatectomy, bilateral pelvic lymph node dissection, extensive laparoscopic adhesiolysis, small bowel resection with ileal conduit urinary diversion 10/26/21 by [name], COPD, history of tobacco use disorder who presented to [facility] ED on 11/10 from urology clinic with finding of low blood pressure.  Following recent discharge, patient has been dealing with constipation with poor oral intake with darker urine.  Also complaining of increasing left lower quadrant pain and intermittent fevers at home.  Reports that his ileal conduit has been functioning normally.  Pain worsened to the point where his pain medications were not effective.  EMS was called on 11/9 but was not transported and was instructed to add naproxen to his pain regimen which seemed to give some relief.  Patient was seen in follow-up in the urology clinic on 1111 was found to have blood pressure in the 53M systolic and was referred to the ED for further assessment.  Patient recent hospitalized and underwent robotic assisted laparoscopic radical cystectomy/prostatectomy with bilateral pelvic lymph node dissection, extensive laparoscopic adhesion anxiolysis, small bowel resection and ileal conduit urinary diversion on 10/25/2021 by [name] for his invasive bladder cancer; hospital course was largely uneventful and was discharged on postop day 8 11/02/2021.  In the ED, SBP's in the 60s, treated with IV fluids and started on Levophed for blood pressure support.  Sodium 131, potassium 5.2, chloride 98, CO2 21, glucose 88, BUN 43, creatinine 2.87, anion gap 12, lactic acid 4.6, WBC 7, hemoglobin 9.2, platelets 364 with left shift.  Urinalysis from ileal conduit with  100 protein, large leukocytes, greater than 50 WBCs and many bacteria.  CT chest/abdomen/pelvis were notable for findings concerning for underlying ILD, coronary artery calcification, concern for small bowel obstruction/possible transition point in the lower abdomen/pelvis near surgical anastomosis with small/moderate volume ascites in the pelvis/left abdomen, small amount of free intraperitoneal air (?post-op), small to moderate amount of subcutaneous emphysema without accompanying fluid collection/stranding, cystoprostatectomy changes with right-sided urinary ostomy, bilateral ureteral stents extending through the ostomy with no hydronephrosis.  Patient was admitted to the PCCM service due to septic shock from likely urinary source.  Care transferred from [facility] to hospital service on 11/11/2021.   Assessment & Plan:   Principal Problem:   Septic shock ([place]) Active Problems:   Chronic pain   Bladder cancer ([place])   Bacteremia due to Gram-negative bacteria   UTI due to Klebsiella species   Septic shock, POA Klebsiella oxytoca UTI/septicemia, POA Patient presenting to ED from urology clinic after being found with systolic blood pressures in the 50s.  Patient was given IV fluid resuscitation without improvement and eventually placed on Levophed for vasopressor support.  Patient met septic shock criteria on admission with hypotension not improved with IV fluid resuscitation requiring Levophed in the setting of endorgan damage with acute renal failure, lactic acidosis with source of infection urinary tract with septicemia.  Patient was started on empiric antibiotics initially with vancomycin, cefepime, metronidazole and was de-escalated to ceftriaxone based on cultures. --Now titrated off of vasopressors --WBC 7.0>12.5>18.6>21.5>19.3 --PCT 41.64>32.48>18.72>4.67 --Blood cultures x2: GNR's with BCID Klebsiella oxytoca --Urine culture >100K Klebsiella oxytoca/Enterococcus faecalis --Unasyn 3g IV q6h    --CBC daily  Small bowel obstruction CT abdomen/pelvis on admission with concern for small bowel  obstruction with possible transition point in the lower abdomen/pelvis near surgical anastomosis site.  General surgery believes this is likely secondary to inflammatory process from recent abdominal surgery and not fibrous/adhesion in nature; and recommend conservative management with NG tube and bowel rest. --General surgery/urology following, appreciate assistance --NGT clamped by Gneral Surgery today; trial of clear liquids --Continue IVF with D5LR at 75 mL/h --Further per general surgery/urology  Acute renal failure: resolved Etiology likely secondary to prolonged prerenal state from dehydration and poor oral intake versus ATN from hypotension/septic shock as above. --Cr 2.87>3.00>1.67>1.21>1.04>0.90 --IVF with D5LR at 75 mL/h --Avoid nephrotoxins, renal dose all medications --BMP daily  Invasive muscle bladder cancer s/p radical cystectomy, prostatectomy and ileal conduit Underwent robotic assisted laparoscopic radical cystectomy, robotic radical prostatectomy, bilateral pelvic lymph node dissection, extensive laparoscopic adhesiolysis, small bowel resection and ileal conduit urinary diversion on 10/25/2021 by [name] of urology. --Urology following, appreciate assistance --Continue ostomy care; monitor urinary output  COPD History of tobacco abuse --Incruse Ellipta 1 puff daily --DuoNebs as needed --Currently on room air  Normocytic anemia 9.2>9.5>9.2>8.8>9.4>9.2, Stable --Transfuse for hemoglobin less than 7.0 or active bleeding --CBC daily  Suspected ILD Noted on CT imaging this admission.  History of tobacco abuse. --Flonase --Outpatient pulmonology follow-up  Chronic back pain At baseline on gabapentin, oxycodone, Zanaflex. --Holding home oral medicines due to small bowel obstruction --Morphine 2-41m IV q4h PRN  Weakness/deconditioning/debility: --Needs increased  mobility, OOB to chair BID --PT/OT evaluation: pending  DVT prophylaxis: heparin injection 5,000 Units Start: 10/31/2021 1515 SCDs Start: 11/10/2021 1502   Code Status: Full Code Family Communication: No family present at bedside this morning  Disposition Plan:  Level of care: Med-Surg Status is: Inpatient  Remains inpatient appropriate because: Continues on IV antibiotics, awaiting culture identification/susceptibilities.  Pending PT/OT evaluation   Consultants:  PCCM - signed off 11/12 General surgery Urology  Procedures:  Central line placement 11/10  Antimicrobials:  Ceftriaxone 11/11 - 11/13 Vancomycin 11/10 - 11/11 Cefepime 11/10 - 11/11 Metronidazole 11/10 - 11/10 Unasyn 11/13>>   Subjective: Patient seen examined at bedside, resting comfortably.  Family present at bedside.  Receiving ice chips.  Seen by general surgeon this morning, NG tube clamped with plan of trial of clears.  White count slightly improved today.  Overall continues to feel poorly, weak and debilitated.  No other questions or concerns at this time.   Denies headache, no chest pain, no palpitations, no shortness of breath, no abdominal pain, no current fever/chills/night sweats, no nausea/vomiting/diarrhea.  No acute concerns overnight per nursing staff.  Objective: Vitals:   11/13/21 0040 11/13/21 0600 11/13/21 0603 11/13/21 0915  BP: (!) 143/85 139/71    Pulse: (!) 106 100    Resp: 16 16    Temp: 98.5 F (36.9 C) 98.3 F (36.8 C)    TempSrc: Oral Axillary    SpO2: 96% 97%  97%  Weight:   83.7 kg   Height:        Intake/Output Summary (Last 24 hours) at 11/13/2021 1050 Last data filed at 11/13/2021 05462Gross per 24 hour  Intake 1321.38 ml  Output 1270 ml  Net 51.38 ml   Filed Weights   11/11/21 0500 11/12/21 0500 11/13/21 0603  Weight: 77.5 kg 80 kg 83.7 kg    Examination:  General exam: Appears calm and comfortable, chronically ill in appearance Respiratory system: Clear to  auscultation. Respiratory effort normal.  On room air Cardiovascular system: S1 & S2 heard, RRR. No JVD, murmurs, rubs,  gallops or clicks. No pedal edema. Gastrointestinal system: Abdomen with mild distention, soft, mild generalized tenderness to palpation. No organomegaly or masses felt.  No appreciable bowel sounds.  NG tube and currently clamped, urostomy noted with yellow urine with sediment in collection bag Central nervous system: Alert and oriented. No focal neurological deficits. Extremities: Symmetric 5 x 5 power. Skin: No rashes, lesions or ulcers Psychiatry: Judgement and insight appear normal. Mood & affect appropriate.     Data Reviewed: I have personally reviewed following labs and imaging studies  CBC: Recent Labs  Lab 11/06/2021 1016 11/03/2021 1029 11/10/21 0514 11/11/21 0542 11/12/21 0319 11/13/21 0319  WBC 7.0  --  12.5* 18.6* 21.5* 19.3*  NEUTROABS 6.2  --   --   --   --   --   HGB 9.2* 9.5* 9.2* 8.8* 9.4* 9.2*  HCT 28.2* 28.0* 28.1* 27.5* 29.2* 28.8*  MCV 91.6  --  90.1 92.9 90.7 92.0  PLT 364  --  359 281 256 226   Basic Metabolic Panel: Recent Labs  Lab 11/14/2021 1749 11/10/21 0500 11/11/21 0542 11/12/21 0319 11/13/21 0319  NA 131* 134* 135 142 145  K 4.6 4.6 3.9 3.8 3.5  CL 101 100 103 105 108  CO2 16* _0 GLUCOSE 75 87 89 105* 119*  BUN 38* 35* 31* 26* 23  CREATININE 2.17* 1.67* 1.21 1.04 0.90  CALCIUM 7.3* 7.4* 7.5* 7.9* 7.7*  MG  --  2.0  --  2.4  --   PHOS  --  3.7  --  3.0  --    GFR: Estimated Creatinine Clearance: 68.8 mL/min (by C-G formula based on SCr of 0.9 mg/dL). Liver Function Tests: Recent Labs  Lab 10/31/2021 1016  AST 49*  ALT 21  ALKPHOS 57  BILITOT 1.0  PROT 5.4*  ALBUMIN 2.1*   No results for input(s): LIPASE, AMYLASE in the last 168 hours. No results for input(s): AMMONIA in the last 168 hours. Coagulation Profile: Recent Labs  Lab 11/18/2021 1016 11/10/21 0500  INR 1.4* 1.5*   Cardiac Enzymes: No  results for input(s): CKTOTAL, CKMB, CKMBINDEX, TROPONINI in the last 168 hours. BNP (last 3 results) No results for input(s): PROBNP in the last 8760 hours. HbA1C: No results for input(s): HGBA1C in the last 72 hours. CBG: Recent Labs  Lab 11/11/21 1236 11/11/21 1603 11/11/21 1954 11/11/21 2348 11/12/21 0358  GLUCAP 86 87 109* 109* 100*   Lipid Profile: No results for input(s): CHOL, HDL, LDLCALC, TRIG, CHOLHDL, LDLDIRECT in the last 72 hours. Thyroid Function Tests: No results for input(s): TSH, T4TOTAL, FREET4, T3FREE, THYROIDAB in the last 72 hours. Anemia Panel: No results for input(s): VITAMINB12, FOLATE, FERRITIN, TIBC, IRON, RETICCTPCT in the last 72 hours. Sepsis Labs: Recent Labs  Lab 11/24/2021 1016 11/28/2021 1216 11/07/2021 1749 11/10/21 0500 11/11/21 0542 11/13/21 0319  PROCALCITON  --   --  41.64 32.48 18.72 4.67  LATICACIDVEN 4.6* 4.5* 4.8*  --   --   --     Recent Results (from the past 240 hour(s))  Blood Culture (routine x 2)     Status: Abnormal   Collection Time: 11/19/2021 10:16 AM   Specimen: BLOOD RIGHT ARM  Result Value Ref Range Status   Specimen Description BLOOD RIGHT ARM  Final   Special Requests   Final    BOTTLES DRAWN AEROBIC AND ANAEROBIC Blood Culture adequate volume   Culture  Setup Time   Final    GRAM NEGATIVE  RODS IN BOTH AEROBIC AND ANAEROBIC BOTTLES CRITICAL RESULT CALLED TO, READ BACK BY AND VERIFIED WITH: M LILLISTON,PHARMD_[email or web address]  11/10/21 [place] Performed at [facility], [place] [address], [place], [place] [address]    Culture KLEBSIELLA OXYTOCA (A)  Final   Report Status 11/12/2021 FINAL  Final   Organism ID, Bacteria KLEBSIELLA OXYTOCA  Final      Susceptibility   Klebsiella oxytoca - MIC*    AMPICILLIN >=32 RESISTANT Resistant     CEFAZOLIN 8 SENSITIVE Sensitive     CEFEPIME <=0.12 SENSITIVE Sensitive     CEFTAZIDIME <=1 SENSITIVE Sensitive     CEFTRIAXONE <=0.25 SENSITIVE Sensitive     CIPROFLOXACIN <=0.25 SENSITIVE  Sensitive     GENTAMICIN <=1 SENSITIVE Sensitive     IMIPENEM <=0.25 SENSITIVE Sensitive     TRIMETH/SULFA <=20 SENSITIVE Sensitive     AMPICILLIN/SULBACTAM 8 SENSITIVE Sensitive     PIP/TAZO <=4 SENSITIVE Sensitive     * KLEBSIELLA OXYTOCA  Urine Culture     Status: Abnormal   Collection Time: 11/05/2021 10:16 AM   Specimen: In/Out Cath Urine  Result Value Ref Range Status   Specimen Description   Final    IN/OUT CATH URINE Performed at [place] [address], [place], [place] [address]    Special Requests   Final    NONE Performed at [facility], [place] [address], [place], [place] [address]    Culture (A)  Final    >=100,000 COLONIES/mL KLEBSIELLA OXYTOCA >=100,000 COLONIES/mL ENTEROCOCCUS FAECALIS    Report Status 11/12/2021 FINAL  Final   Organism ID, Bacteria KLEBSIELLA OXYTOCA (A)  Final   Organism ID, Bacteria ENTEROCOCCUS FAECALIS (A)  Final      Susceptibility   Enterococcus faecalis - MIC*    AMPICILLIN <=2 SENSITIVE Sensitive     NITROFURANTOIN <=16 SENSITIVE Sensitive     VANCOMYCIN 1 SENSITIVE Sensitive     * >=100,000 COLONIES/mL ENTEROCOCCUS FAECALIS   Klebsiella oxytoca - MIC*    AMPICILLIN >=32 RESISTANT Resistant     CEFAZOLIN 8 SENSITIVE Sensitive     CEFEPIME <=0.12 SENSITIVE Sensitive     CEFTRIAXONE <=0.25 SENSITIVE Sensitive     CIPROFLOXACIN <=0.25 SENSITIVE Sensitive     GENTAMICIN <=1 SENSITIVE Sensitive     IMIPENEM <=0.25 SENSITIVE Sensitive     NITROFURANTOIN 32 SENSITIVE Sensitive     TRIMETH/SULFA <=20 SENSITIVE Sensitive     AMPICILLIN/SULBACTAM 8 SENSITIVE Sensitive     PIP/TAZO <=4 SENSITIVE Sensitive     * >=100,000 COLONIES/mL KLEBSIELLA OXYTOCA  Blood Culture ID Panel (Reflexed)     Status: Abnormal   Collection Time: 11/24/2021 10:16 AM  Result Value Ref Range Status   Enterococcus faecalis NOT DETECTED NOT DETECTED Final   Enterococcus Faecium NOT DETECTED NOT DETECTED Final   Listeria  monocytogenes NOT DETECTED NOT DETECTED Final   Staphylococcus species NOT DETECTED NOT DETECTED Final   Staphylococcus aureus (BCID) NOT DETECTED NOT DETECTED Final   Staphylococcus epidermidis NOT DETECTED NOT DETECTED Final   Staphylococcus lugdunensis NOT DETECTED NOT DETECTED Final   Streptococcus species NOT DETECTED NOT DETECTED Final   Streptococcus agalactiae NOT DETECTED NOT DETECTED Final   Streptococcus pneumoniae NOT DETECTED NOT DETECTED Final   Streptococcus pyogenes NOT DETECTED NOT DETECTED Final   A.calcoaceticus-baumannii NOT DETECTED NOT DETECTED Final   Bacteroides fragilis NOT DETECTED NOT DETECTED Final   Enterobacterales DETECTED (A) NOT DETECTED Final    Comment: Enterobacterales represent a large order of  gram negative bacteria, not a single organism. CRITICAL RESULT CALLED TO, READ BACK BY AND VERIFIED WITH: M LILLISTON,PHARMD_[email or web address]  11/10/21 [place]    Enterobacter cloacae complex NOT DETECTED NOT DETECTED Final   Escherichia coli NOT DETECTED NOT DETECTED Final   Klebsiella aerogenes NOT DETECTED NOT DETECTED Final   Klebsiella oxytoca DETECTED (A) NOT DETECTED Final    Comment: CRITICAL RESULT CALLED TO, READ BACK BY AND VERIFIED WITH: M LILLISTON,[email or web address]_1  11/10/21 [place]    Klebsiella pneumoniae NOT DETECTED NOT DETECTED Final   Proteus species NOT DETECTED NOT DETECTED Final   Salmonella species NOT DETECTED NOT DETECTED Final   Serratia marcescens NOT DETECTED NOT DETECTED Final   Haemophilus influenzae NOT DETECTED NOT DETECTED Final   Neisseria meningitidis NOT DETECTED NOT DETECTED Final   Pseudomonas aeruginosa NOT DETECTED NOT DETECTED Final   Stenotrophomonas maltophilia NOT DETECTED NOT DETECTED Final   Candida albicans NOT DETECTED NOT DETECTED Final   Candida auris NOT DETECTED NOT DETECTED Final   Candida glabrata NOT DETECTED NOT DETECTED Final   Candida krusei NOT DETECTED NOT DETECTED Final   Candida parapsilosis NOT DETECTED NOT DETECTED Final    Candida tropicalis NOT DETECTED NOT DETECTED Final   Cryptococcus neoformans/gattii NOT DETECTED NOT DETECTED Final   CTX-M ESBL NOT DETECTED NOT DETECTED Final   Carbapenem resistance IMP NOT DETECTED NOT DETECTED Final   Carbapenem resistance KPC NOT DETECTED NOT DETECTED Final   Carbapenem resistance NDM NOT DETECTED NOT DETECTED Final   Carbapenem resist OXA 48 LIKE NOT DETECTED NOT DETECTED Final   Carbapenem resistance VIM NOT DETECTED NOT DETECTED Final    Comment: Performed at [facility], [address], [place], [place] [address]  Blood Culture (routine x 2)     Status: Abnormal   Collection Time: 11/02/2021 10:21 AM   Specimen: BLOOD RIGHT WRIST  Result Value Ref Range Status   Specimen Description   Final    BLOOD RIGHT WRIST Performed at [facility], [address] [place], [place] [address]    Special Requests   Final    BOTTLES DRAWN AEROBIC ONLY Blood Culture adequate volume Performed at [place] [address] [place], [place] [address]    Culture  Setup Time   Final    GRAM NEGATIVE RODS AEROBIC BOTTLE ONLY CRITICAL VALUE NOTED.  VALUE IS CONSISTENT WITH PREVIOUSLY REPORTED AND CALLED VALUE.    Culture (A)  Final    KLEBSIELLA OXYTOCA SUSCEPTIBILITIES PERFORMED ON PREVIOUS CULTURE WITHIN THE LAST 5 DAYS. Performed at [facility], [place] [address], [place], [place] [address]    Report Status 11/12/2021 FINAL  Final  Resp Panel by RT-PCR (Flu A&B, Covid) Nasopharyngeal Swab     Status: None   Collection Time: 11/16/2021 11:18 AM   Specimen: Nasopharyngeal Swab; Nasopharyngeal(NP) swabs in vial transport medium  Result Value Ref Range Status   SARS Coronavirus 2 by RT PCR NEGATIVE NEGATIVE Final    Comment: (NOTE) SARS-CoV-2 target nucleic acids are NOT DETECTED.  The SARS-CoV-2 RNA is generally detectable in upper respiratory specimens during the acute phase of infection. The lowest concentration of SARS-CoV-2 viral  copies this assay can detect is 138 copies/mL. A negative result does not preclude SARS-Cov-2 infection and should not be used as the sole basis for treatment or other patient management decisions. A negative result may occur with  improper specimen collection/handling, submission of specimen other than nasopharyngeal swab, presence of viral mutation(s) within the areas  targeted by this assay, and inadequate number of viral copies(<138 copies/mL). A negative result must be combined with clinical observations, patient history, and epidemiological information. The expected result is Negative.  Fact Sheet for Patients:  EntrepreneurPulse.com.au  Fact Sheet for Healthcare Providers:  IncredibleEmployment.be  This test is no t yet approved or cleared by the [place] FDA and  has been authorized for detection and/or diagnosis of SARS-CoV-2 by FDA under an Emergency Use Authorization (EUA). This EUA will remain  in effect (meaning this test can be used) for the duration of the COVID-19 declaration under Section 564(b)(1) of the Act, 21 U.S.C.section 360bbb-3(b)(1), unless the authorization is terminated  or revoked sooner.       Influenza A by PCR NEGATIVE NEGATIVE Final   Influenza B by PCR NEGATIVE NEGATIVE Final    Comment: (NOTE) The Xpert Xpress SARS-CoV-2/FLU/RSV plus assay is intended as an aid in the diagnosis of influenza from Nasopharyngeal swab specimens and should not be used as a sole basis for treatment. Nasal washings and aspirates are unacceptable for Xpert Xpress SARS-CoV-2/FLU/RSV testing.  Fact Sheet for Patients: EntrepreneurPulse.com.au  Fact Sheet for Healthcare Providers: IncredibleEmployment.be  This test is not yet approved or cleared by the [place] FDA and has been authorized for detection and/or diagnosis of SARS-CoV-2 by FDA under an Emergency Use Authorization (EUA). This  EUA will remain in effect (meaning this test can be used) for the duration of the COVID-19 declaration under Section 564(b)(1) of the Act, 21 U.S.C. section 360bbb-3(b)(1), unless the authorization is terminated or revoked.  Performed at [facility], [place] [address], [place], [place] [address]          Radiology Studies: DG Abd Portable 1V  Result Date: 11/12/2021 CLINICAL DATA:  NG tube placement EXAM: PORTABLE ABDOMEN - 1 VIEW COMPARISON:  11/12/2021 at 0744 hours FINDINGS: Enteric tube terminates in the proximal gastric fundus. Bilateral ureteral stents. Lumbar spine fixation hardware. IMPRESSION: Enteric tube terminates in the proximal gastric fundus. Electronically Signed   By: Julian Hy M.D.   On: 11/12/2021 19:15   DG Abd Portable 1V  Result Date: 11/12/2021 CLINICAL DATA:  Small bowel obstruction EXAM: PORTABLE ABDOMEN - 1 VIEW COMPARISON:  November tenth 2022 FINDINGS: There is gaseous distension of loops of bowel throughout the abdomen. Air is seen in the rectum. There is persistent gaseous dilation of several loops of small bowel in the LEFT mid abdomen, similar in comparison to prior. Bilateral nephroureteral stents. Enteric tube tip and side port project over the stomach. Bibasilar heterogeneous opacities with a likely small LEFT pleural effusion. Status post posterior fixation of the lumbar spine. IMPRESSION: 1. Persistent gaseous dilation of several loops of small bowel in the LEFT mid abdomen. There is gaseous distension of loops of large bowel. Constellation of findings may reflect partial small bowel obstruction versus ileus. Electronically Signed   By: Valentino Saxon M.D.   On: 11/12/2021 09:56        Scheduled Meds:  chlorhexidine  15 mL Mouth Rinse BID   Chlorhexidine Gluconate Cloth  6 each Topical Daily   fluticasone  2 spray Each Nare Daily   heparin  5,000 Units Subcutaneous Q8H   lip balm  1 application Topical BID   mouth  rinse  15 mL Mouth Rinse q12n4p   sodium chloride flush  10-40 mL Intracatheter Q12H   thiamine injection  100 mg Intravenous Daily   umeclidinium bromide  1 puff Inhalation Daily   Continuous  Infusions:  sodium chloride Stopped (11/13/21 0313)   ampicillin-sulbactam (UNASYN) IV 3 g (11/13/21 0946)   dextrose 5% lactated ringers 75 mL/hr at 11/13/21 0314   ondansetron (ZOFRAN) IV       LOS: 4 days    Time spent: 39 minutes spent on chart review, discussion with nursing staff, consultants, updating family and interview/physical exam; more than 50% of that time was spent in counseling and/or coordination of care.     J [place] DO [facility] Available via Epic secure chat 7am-7pm After these hours, please refer to coverage provider listed on [email or web address] 11/13/2021, 10:50 AM

## 2021-11-13 NOTE — Progress Notes (Addendum)
[facility] Progress Note     Subjective: CC: wants to drink/eat Patient states his abdominal pain is better. Reports increasing flatus. Also c/o hiccups. Denies BM. Mobilized yeterday with therapy. He is taking in a large volume of ice chips.  Objective: Vital signs in last 24 hours: Temp:  [98.2 F (36.8 C)-99.5 F (37.5 C)] 98.3 F (36.8 C) (11/14 0600) Pulse Rate:  [80-119] 100 (11/14 0600) Resp:  [16-18] 16 (11/14 0600) BP: (126-143)/(51-85) 139/71 (11/14 0600) SpO2:  [94 %-97 %] 97 % (11/14 0915) Weight:  [83.7 kg] 83.7 kg (11/14 0603) Last BM Date: 11/04/21  Intake/Output from previous day: 11/13 0701 - 11/14 0700 In: 2071.9 [P.O.:90; I.V.:1581.9; IV Piggyback:400] Out: 1770 [Urine:1650; Emesis/NG output:120] Intake/Output this shift: No intake/output data recorded.  PE: Gen:  Alert, NAD, ill appearing male Card:  Regular rate and rhythm, pitting lower extremity edema  Pulm:  Normal effort, clear to auscultation anteriorly bilaterally with diminished breath sounds bilateral lung bases  Abd: Soft, mild distention, RLQ urostomy viable with cloudy drainage, midline incision c/d/I, left flank erythema resolved. NG with mostly clear, bile stained contents - appears to be mostly water/ice. Skin: warm and dry, no rashes or lesions Psych: A&Ox3   Lab Results:  Recent Labs    11/12/21 0319 11/13/21 0319  WBC 21.5* 19.3*  HGB 9.4* 9.2*  HCT 29.2* 28.8*  PLT 256 266   BMET Recent Labs    11/12/21 0319 11/13/21 0319  NA 142 145  K 3.8 3.5  CL 105 108  CO2 27 29  GLUCOSE 105* 119*  BUN 26* 23  CREATININE 1.04 0.90  CALCIUM 7.9* 7.7*   PT/INR No results for input(s): LABPROT, INR in the last 72 hours.  CMP     Component Value Date/Time   NA 145 11/13/2021 0319   NA 137 09/21/2020 0942   K 3.5 11/13/2021 0319   CL 108 11/13/2021 0319   CO2 29 11/13/2021 0319   GLUCOSE 119 (H) 11/13/2021 0319   BUN 23 11/13/2021 0319   BUN 5 (L) 09/21/2020  0942   CREATININE 0.90 11/13/2021 0319   CREATININE 0.72 06/09/2021 1232   CREATININE 0.90 02/07/2017 1032   CALCIUM 7.7 (L) 11/13/2021 0319   PROT 5.4 (L) 11/28/2021 1016   PROT 7.2 03/10/2021 0845   ALBUMIN 2.1 (L) 11/10/2021 1016   ALBUMIN 4.4 03/10/2021 0845   AST 49 (H) 11/02/2021 1016   AST 22 06/09/2021 1232   ALT 21 11/24/2021 1016   ALT 10 06/09/2021 1232   ALKPHOS 57 11/15/2021 1016   BILITOT 1.0 11/26/2021 1016   BILITOT 0.4 06/09/2021 1232   GFRNONAA >60 11/13/2021 0319   GFRNONAA >60 06/09/2021 1232   GFRNONAA 84 02/07/2017 1032   GFRAA >60 09/23/2020 1133   GFRAA >89 02/07/2017 1032   Lipase  No results found for: LIPASE     Studies/Results: DG Abd Portable 1V  Result Date: 11/12/2021 CLINICAL DATA:  NG tube placement EXAM: PORTABLE ABDOMEN - 1 VIEW COMPARISON:  11/12/2021 at 0744 hours FINDINGS: Enteric tube terminates in the proximal gastric fundus. Bilateral ureteral stents. Lumbar spine fixation hardware. IMPRESSION: Enteric tube terminates in the proximal gastric fundus. Electronically Signed   By: Julian Hy M.D.   On: 11/12/2021 19:15   DG Abd Portable 1V  Result Date: 11/12/2021 CLINICAL DATA:  Small bowel obstruction EXAM: PORTABLE ABDOMEN - 1 VIEW COMPARISON:  November tenth 2022 FINDINGS: There is gaseous distension of loops of bowel throughout the abdomen.  Air is seen in the rectum. There is persistent gaseous dilation of several loops of small bowel in the LEFT mid abdomen, similar in comparison to prior. Bilateral nephroureteral stents. Enteric tube tip and side port project over the stomach. Bibasilar heterogeneous opacities with a likely small LEFT pleural effusion. Status post posterior fixation of the lumbar spine. IMPRESSION: 1. Persistent gaseous dilation of several loops of small bowel in the LEFT mid abdomen. There is gaseous distension of loops of large bowel. Constellation of findings may reflect partial small bowel obstruction versus  ileus. Electronically Signed   By: Valentino Saxon M.D.   On: 11/12/2021 09:56    Anti-infectives: Anti-infectives (From admission, onward)    Start     Dose/Rate Route Frequency Ordered Stop   11/12/21 0800  Ampicillin-Sulbactam (UNASYN) 3 g in sodium chloride 0.9 % 100 mL IVPB        3 g 200 mL/hr over 30 Minutes Intravenous Every 6 hours 11/12/21 0712     11/10/21 1400  cefTRIAXone (ROCEPHIN) 2 g in sodium chloride 0.9 % 100 mL IVPB  Status:  Discontinued        2 g 200 mL/hr over 30 Minutes Intravenous Daily 11/10/21 1048 11/12/21 0712   11/10/21 0800  ceFEPIme (MAXIPIME) 2 g in sodium chloride 0.9 % 100 mL IVPB  Status:  Discontinued        2 g 200 mL/hr over 30 Minutes Intravenous Every 12 hours 11/10/21 0618 11/10/21 1048   11/10/21 0630  ceFEPIme (MAXIPIME) 2 g in sodium chloride 0.9 % 100 mL IVPB  Status:  Discontinued        2 g 200 mL/hr over 30 Minutes Intravenous Every 24 hours 11/02/2021 1622 11/10/21 0618   11/14/2021 1624  vancomycin variable dose per unstable renal function (pharmacist dosing)  Status:  Discontinued         Does not apply See admin instructions 11/17/2021 1624 11/10/21 0613   11/15/2021 1030  ceFEPIme (MAXIPIME) 2 g in sodium chloride 0.9 % 100 mL IVPB        2 g 200 mL/hr over 30 Minutes Intravenous  Once 11/17/2021 1016 11/02/2021 1133   10/31/2021 1030  metroNIDAZOLE (FLAGYL) IVPB 500 mg        500 mg 100 mL/hr over 60 Minutes Intravenous  Once 11/15/2021 1016 11/12/2021 1915   11/15/2021 1030  vancomycin (VANCOCIN) IVPB 1000 mg/200 mL premix  Status:  Discontinued        1,000 mg 200 mL/hr over 60 Minutes Intravenous  Once 11/07/2021 1016 11/13/2021 1023   11/01/2021 1030  vancomycin (VANCOREADY) IVPB 1500 mg/300 mL        1,500 mg 150 mL/hr over 120 Minutes Intravenous  Once 11/25/2021 1023 11/18/2021 1341        Assessment/Plan pSBO  vs ileus - s/p Robotic-assisted laparoscopic radical cystectomy, radical prostatectomy, Bilateral pelvic lymph node dissection,  Extensive laparoscopic adhesiolysis, Small bowel resection, Ileal conduit urinary diversion 10/26/21 [name] - CT scan with SBO pattern, possibly a pSBO at the level of the surgical anastomosis  -  ileus vs SBO seem to be improving -- having flatus, clear NG output, gas in the colon on X-ray. clamp NG tube, remove NG tube and start clears this afternoon if tolerates clamping trial - PT/OT   Small volume pneumoperitoneum - reviewed with Dr. Johney Maine and [name] 11/10, suspect this is related to insufflation/post-operative state.  ID - maxipime/ flagyl/ vancomycin 11/10, switched to Rocephin based on urine cx per  primary team VTE - SCDs, per primary FEN - NPO, clamp NG Foley - none. Urostomy/Ileal conduit   Septic shock - resolved Klebsiella Oxytoca UTI  Asthma/ COPD Chronic back pain HLD AKI Possible UTI  LOS: 4 days    Obie Dredge, [facility] Surgery Please see Amion for pager number during day hours 7:00am-4:30pm

## 2021-11-13 NOTE — Progress Notes (Signed)
Subjective: Wants to get up and walk today. No abdominal pain. Continues to pass flatus. Feeling thirsty and with dry mouth.   Objective: Vital signs in last 24 hours: Temp:  [98.2 F (36.8 C)-99.5 F (37.5 C)] 98.3 F (36.8 C) (11/14 0600) Pulse Rate:  [80-119] 100 (11/14 0600) Resp:  [16-18] 16 (11/14 0600) BP: (126-143)/(51-85) 139/71 (11/14 0600) SpO2:  [94 %-97 %] 97 % (11/14 0600) Weight:  [83.7 kg] 83.7 kg (11/14 0603)  Intake/Output from previous day: 11/13 0701 - 11/14 0700 In: 2071.9 [P.O.:90; I.V.:1581.9; IV Piggyback:400] Out: 1770 [Urine:1650; Emesis/NG output:120] Intake/Output this shift: No intake/output data recorded.  Physical Exam:  Constitutional: Vital signs reviewed. WD WN in NAD   Eyes: PERRL, No scleral icterus.   Cardiovascular: RRR Pulmonary/Chest: Normal effort Abdominal: Moderately distended, firm, tympanitic.  Ostomy pink/viable.  Incision looks fine. Extremities: 1+ pitting edema to level of shins bilaterally   Lab Results: Recent Labs    11/11/21 0542 11/12/21 0319 11/13/21 0319  HGB 8.8* 9.4* 9.2*  HCT 27.5* 29.2* 28.8*   BMET Recent Labs    11/12/21 0319 11/13/21 0319  NA 142 145  K 3.8 3.5  CL 105 108  CO2 27 29  GLUCOSE 105* 119*  BUN 26* 23  CREATININE 1.04 0.90  CALCIUM 7.9* 7.7*   No results for input(s): LABPT, INR in the last 72 hours.  No results for input(s): LABURIN in the last 72 hours. Results for orders placed or performed during the hospital encounter of 11/05/2021  Blood Culture (routine x 2)     Status: Abnormal   Collection Time: 11/03/2021 10:16 AM   Specimen: BLOOD RIGHT ARM  Result Value Ref Range Status   Specimen Description BLOOD RIGHT ARM  Final   Special Requests   Final    BOTTLES DRAWN AEROBIC AND ANAEROBIC Blood Culture adequate volume   Culture  Setup Time   Final    GRAM NEGATIVE RODS IN BOTH AEROBIC AND ANAEROBIC BOTTLES CRITICAL RESULT CALLED TO, READ BACK BY AND VERIFIED WITH: M  LILLISTON,PHARMD@[id number]  11/10/21 [place] Performed at [facility], [place] [address], [place], [place] [address]    Culture KLEBSIELLA OXYTOCA (A)  Final   Report Status 11/12/2021 FINAL  Final   Organism ID, Bacteria KLEBSIELLA OXYTOCA  Final      Susceptibility   Klebsiella oxytoca - MIC*    AMPICILLIN >=32 RESISTANT Resistant     CEFAZOLIN 8 SENSITIVE Sensitive     CEFEPIME <=0.12 SENSITIVE Sensitive     CEFTAZIDIME <=1 SENSITIVE Sensitive     CEFTRIAXONE <=0.25 SENSITIVE Sensitive     CIPROFLOXACIN <=0.25 SENSITIVE Sensitive     GENTAMICIN <=1 SENSITIVE Sensitive     IMIPENEM <=0.25 SENSITIVE Sensitive     TRIMETH/SULFA <=20 SENSITIVE Sensitive     AMPICILLIN/SULBACTAM 8 SENSITIVE Sensitive     PIP/TAZO <=4 SENSITIVE Sensitive     * KLEBSIELLA OXYTOCA  Urine Culture     Status: Abnormal   Collection Time: 11/01/2021 10:16 AM   Specimen: In/Out Cath Urine  Result Value Ref Range Status   Specimen Description   Final    IN/OUT CATH URINE Performed at [place] [address] [place],  [address]    Special Requests   Final    NONE Performed at [facility], [place] [address], [place],  [address]    Culture (A)  Final    >=100,000 COLONIES/mL KLEBSIELLA OXYTOCA >=100,000 COLONIES/mL ENTEROCOCCUS FAECALIS  Report Status 11/12/2021 FINAL  Final   Organism ID, Bacteria KLEBSIELLA OXYTOCA (A)  Final   Organism ID, Bacteria ENTEROCOCCUS FAECALIS (A)  Final      Susceptibility   Enterococcus faecalis - MIC*    AMPICILLIN <=2 SENSITIVE Sensitive     NITROFURANTOIN <=16 SENSITIVE Sensitive     VANCOMYCIN 1 SENSITIVE Sensitive     * >=100,000 COLONIES/mL ENTEROCOCCUS FAECALIS   Klebsiella oxytoca - MIC*    AMPICILLIN >=32 RESISTANT Resistant     CEFAZOLIN 8 SENSITIVE Sensitive     CEFEPIME <=0.12 SENSITIVE Sensitive     CEFTRIAXONE <=0.25 SENSITIVE Sensitive     CIPROFLOXACIN <=0.25 SENSITIVE Sensitive     GENTAMICIN <=1  SENSITIVE Sensitive     IMIPENEM <=0.25 SENSITIVE Sensitive     NITROFURANTOIN 32 SENSITIVE Sensitive     TRIMETH/SULFA <=20 SENSITIVE Sensitive     AMPICILLIN/SULBACTAM 8 SENSITIVE Sensitive     PIP/TAZO <=4 SENSITIVE Sensitive     * >=100,000 COLONIES/mL KLEBSIELLA OXYTOCA  Blood Culture ID Panel (Reflexed)     Status: Abnormal   Collection Time: 11/08/2021 10:16 AM  Result Value Ref Range Status   Enterococcus faecalis NOT DETECTED NOT DETECTED Final   Enterococcus Faecium NOT DETECTED NOT DETECTED Final   Listeria monocytogenes NOT DETECTED NOT DETECTED Final   Staphylococcus species NOT DETECTED NOT DETECTED Final   Staphylococcus aureus (BCID) NOT DETECTED NOT DETECTED Final   Staphylococcus epidermidis NOT DETECTED NOT DETECTED Final   Staphylococcus lugdunensis NOT DETECTED NOT DETECTED Final   Streptococcus species NOT DETECTED NOT DETECTED Final   Streptococcus agalactiae NOT DETECTED NOT DETECTED Final   Streptococcus pneumoniae NOT DETECTED NOT DETECTED Final   Streptococcus pyogenes NOT DETECTED NOT DETECTED Final   A.calcoaceticus-baumannii NOT DETECTED NOT DETECTED Final   Bacteroides fragilis NOT DETECTED NOT DETECTED Final   Enterobacterales DETECTED (A) NOT DETECTED Final    Comment: Enterobacterales represent a large order of gram negative bacteria, not a single organism. CRITICAL RESULT CALLED TO, READ BACK BY AND VERIFIED WITH: M LILLISTON,PHARMD@[id number]  11/10/21 [place]    Enterobacter cloacae complex NOT DETECTED NOT DETECTED Final   Escherichia coli NOT DETECTED NOT DETECTED Final   Klebsiella aerogenes NOT DETECTED NOT DETECTED Final   Klebsiella oxytoca DETECTED (A) NOT DETECTED Final    Comment: CRITICAL RESULT CALLED TO, READ BACK BY AND VERIFIED WITH: M LILLISTON,PHARMD@[id number]  11/10/21 [place]    Klebsiella pneumoniae NOT DETECTED NOT DETECTED Final   Proteus species NOT DETECTED NOT DETECTED Final   Salmonella species NOT DETECTED NOT DETECTED Final   Serratia  marcescens NOT DETECTED NOT DETECTED Final   Haemophilus influenzae NOT DETECTED NOT DETECTED Final   Neisseria meningitidis NOT DETECTED NOT DETECTED Final   Pseudomonas aeruginosa NOT DETECTED NOT DETECTED Final   Stenotrophomonas maltophilia NOT DETECTED NOT DETECTED Final   Candida albicans NOT DETECTED NOT DETECTED Final   Candida auris NOT DETECTED NOT DETECTED Final   Candida glabrata NOT DETECTED NOT DETECTED Final   Candida krusei NOT DETECTED NOT DETECTED Final   Candida parapsilosis NOT DETECTED NOT DETECTED Final   Candida tropicalis NOT DETECTED NOT DETECTED Final   Cryptococcus neoformans/gattii NOT DETECTED NOT DETECTED Final   CTX-M ESBL NOT DETECTED NOT DETECTED Final   Carbapenem resistance IMP NOT DETECTED NOT DETECTED Final   Carbapenem resistance KPC NOT DETECTED NOT DETECTED Final   Carbapenem resistance NDM NOT DETECTED NOT DETECTED Final   Carbapenem resist OXA 48 LIKE NOT DETECTED NOT DETECTED Final  Carbapenem resistance VIM NOT DETECTED NOT DETECTED Final    Comment: Performed at [facility], [place] [address] [place], [place] [address]  Blood Culture (routine x 2)     Status: Abnormal   Collection Time: 11/15/2021 10:21 AM   Specimen: BLOOD RIGHT WRIST  Result Value Ref Range Status   Specimen Description   Final    BLOOD RIGHT WRIST Performed at [facility], [address] [place], [place] [address]    Special Requests   Final    BOTTLES DRAWN AEROBIC ONLY Blood Culture adequate volume Performed at [facility] [address] [place], [place] [address]    Culture  Setup Time   Final    GRAM NEGATIVE RODS AEROBIC BOTTLE ONLY CRITICAL VALUE NOTED.  VALUE IS CONSISTENT WITH PREVIOUSLY REPORTED AND CALLED VALUE.    Culture (A)  Final    KLEBSIELLA OXYTOCA SUSCEPTIBILITIES PERFORMED ON PREVIOUS CULTURE WITHIN THE LAST 5 DAYS. Performed at [facility], [place] [address] [place], [place] [address]    Report Status  11/12/2021 FINAL  Final  Resp Panel by RT-PCR (Flu A&B, Covid) Nasopharyngeal Swab     Status: None   Collection Time: 11/11/2021 11:18 AM   Specimen: Nasopharyngeal Swab; Nasopharyngeal(NP) swabs in vial transport medium  Result Value Ref Range Status   SARS Coronavirus 2 by RT PCR NEGATIVE NEGATIVE Final    Comment: (NOTE) SARS-CoV-2 target nucleic acids are NOT DETECTED.  The SARS-CoV-2 RNA is generally detectable in upper respiratory specimens during the acute phase of infection. The lowest concentration of SARS-CoV-2 viral copies this assay can detect is 138 copies/mL. A negative result does not preclude SARS-Cov-2 infection and should not be used as the sole basis for treatment or other patient management decisions. A negative result may occur with  improper specimen collection/handling, submission of specimen other than nasopharyngeal swab, presence of viral mutation(s) within the areas targeted by this assay, and inadequate number of viral copies(<138 copies/mL). A negative result must be combined with clinical observations, patient history, and epidemiological information. The expected result is Negative.  Fact Sheet for Patients:  EntrepreneurPulse.com.au  Fact Sheet for Healthcare Providers:  IncredibleEmployment.be  This test is no t yet approved or cleared by the [place] FDA and  has been authorized for detection and/or diagnosis of SARS-CoV-2 by FDA under an Emergency Use Authorization (EUA). This EUA will remain  in effect (meaning this test can be used) for the duration of the COVID-19 declaration under Section 564(b)(1) of the Act, 21 U.S.C.section 360bbb-3(b)(1), unless the authorization is terminated  or revoked sooner.       Influenza A by PCR NEGATIVE NEGATIVE Final   Influenza B by PCR NEGATIVE NEGATIVE Final    Comment: (NOTE) The Xpert Xpress SARS-CoV-2/FLU/RSV plus assay is intended as an aid in the diagnosis of  influenza from Nasopharyngeal swab specimens and should not be used as a sole basis for treatment. Nasal washings and aspirates are unacceptable for Xpert Xpress SARS-CoV-2/FLU/RSV testing.  Fact Sheet for Patients: EntrepreneurPulse.com.au  Fact Sheet for Healthcare Providers: IncredibleEmployment.be  This test is not yet approved or cleared by the [place] FDA and has been authorized for detection and/or diagnosis of SARS-CoV-2 by FDA under an Emergency Use Authorization (EUA). This EUA will remain in effect (meaning this test can be used) for the duration of the COVID-19 declaration under Section 564(b)(1) of the Act, 21 U.S.C. section 360bbb-3(b)(1), unless the authorization is terminated or  revoked.  Performed at [facility], [place] [address] [place], [place] [address]     Studies/Results: DG Abd Portable 1V  Result Date: 11/12/2021 CLINICAL DATA:  NG tube placement EXAM: PORTABLE ABDOMEN - 1 VIEW COMPARISON:  11/12/2021 at 0744 hours FINDINGS: Enteric tube terminates in the proximal gastric fundus. Bilateral ureteral stents. Lumbar spine fixation hardware. IMPRESSION: Enteric tube terminates in the proximal gastric fundus. Electronically Signed   By: Julian Hy M.D.   On: 11/12/2021 19:15   DG Abd Portable 1V  Result Date: 11/12/2021 CLINICAL DATA:  Small bowel obstruction EXAM: PORTABLE ABDOMEN - 1 VIEW COMPARISON:  November tenth 2022 FINDINGS: There is gaseous distension of loops of bowel throughout the abdomen. Air is seen in the rectum. There is persistent gaseous dilation of several loops of small bowel in the LEFT mid abdomen, similar in comparison to prior. Bilateral nephroureteral stents. Enteric tube tip and side port project over the stomach. Bibasilar heterogeneous opacities with a likely small LEFT pleural effusion. Status post posterior fixation of the lumbar spine. IMPRESSION: 1. Persistent gaseous  dilation of several loops of small bowel in the LEFT mid abdomen. There is gaseous distension of loops of large bowel. Constellation of findings may reflect partial small bowel obstruction versus ileus. Electronically Signed   By: Valentino Saxon M.D.   On: 11/12/2021 09:56    Assessment/Plan:  3 weeks out from radical cystectomy/lysis of multiple adhesions.  Readmitted for sepsis.  WBC trending down today. Creatinine improved to baseline. Ongoing flatus. Good urine output. Working with PT/OT.  No surgical intervention planned. Will continue to follow.    LOS: 4 days   William [name] 11/13/2021, 7:11 AM

## 2021-11-13 NOTE — Progress Notes (Signed)
Verbal order given from [place] with [facility] to remove NGT and start patient on clear liquid diet.

## 2021-11-14 ENCOUNTER — Inpatient Hospital Stay (HOSPITAL_COMMUNITY): Payer: Medicare HMO

## 2021-11-14 DIAGNOSIS — R6521 Severe sepsis with septic shock: Secondary | ICD-10-CM | POA: Diagnosis not present

## 2021-11-14 DIAGNOSIS — R609 Edema, unspecified: Secondary | ICD-10-CM

## 2021-11-14 DIAGNOSIS — A419 Sepsis, unspecified organism: Secondary | ICD-10-CM | POA: Diagnosis not present

## 2021-11-14 DIAGNOSIS — M7989 Other specified soft tissue disorders: Secondary | ICD-10-CM | POA: Diagnosis not present

## 2021-11-14 LAB — BASIC METABOLIC PANEL
Anion gap: 8 (ref 5–15)
BUN: 22 mg/dL (ref 8–23)
CO2: 27 mmol/L (ref 22–32)
Calcium: 7.5 mg/dL — ABNORMAL LOW (ref 8.9–10.3)
Chloride: 106 mmol/L (ref 98–111)
Creatinine, Ser: 0.84 mg/dL (ref 0.61–1.24)
GFR, Estimated: >60 mL/min (ref >60)
Glucose, Bld: 119 mg/dL — ABNORMAL HIGH (ref 70–99)
Potassium: 3.3 mmol/L — ABNORMAL LOW (ref 3.5–5.1)
Sodium: 141 mmol/L (ref 135–145)

## 2021-11-14 LAB — CBC
HCT: 28.2 % — ABNORMAL LOW (ref 39.0–52.0)
Hemoglobin: 8.9 g/dL — ABNORMAL LOW (ref 13.0–17.0)
MCH: 29.1 pg (ref 26.0–34.0)
MCHC: 31.6 g/dL (ref 30.0–36.0)
MCV: 92.2 fL (ref 80.0–100.0)
Platelets: 263 10*3/uL (ref 150–400)
RBC: 3.06 MIL/uL — ABNORMAL LOW (ref 4.22–5.81)
RDW: 15 % (ref 11.5–15.5)
WBC: 16.2 10*3/uL — ABNORMAL HIGH (ref 4.0–10.5)
nRBC: 0.1 % (ref 0.0–0.2)

## 2021-11-14 LAB — MAGNESIUM: Magnesium: 2.1 mg/dL (ref 1.7–2.4)

## 2021-11-14 LAB — PROCALCITONIN: Procalcitonin: 2.62 ng/mL

## 2021-11-14 MED ORDER — SODIUM CHLORIDE 0.9 % IV SOLN
12.5000 mg | Freq: Four times a day (QID) | INTRAVENOUS | Status: DC | PRN
  Administered 2021-11-14 – 2021-11-22 (×8): 12.5 mg via INTRAVENOUS
  Filled 2021-11-14 (×16): qty 0.5

## 2021-11-14 MED ORDER — POTASSIUM CHLORIDE 10 MEQ/100ML IV SOLN
10.0000 meq | INTRAVENOUS | Status: DC
  Administered 2021-11-14: 10 meq via INTRAVENOUS
  Filled 2021-11-14 (×4): qty 100

## 2021-11-14 MED ORDER — POTASSIUM CHLORIDE 20 MEQ PO PACK
40.0000 meq | PACK | Freq: Once | ORAL | Status: AC
  Administered 2021-11-14: 40 meq via ORAL
  Filled 2021-11-14: qty 2

## 2021-11-14 MED ORDER — BOOST / RESOURCE BREEZE PO LIQD CUSTOM
1.0000 | Freq: Three times a day (TID) | ORAL | Status: DC
  Administered 2021-11-14 – 2021-11-15 (×4): 1 via ORAL

## 2021-11-14 NOTE — Progress Notes (Signed)
Bilateral upper and lower extremity venous duplexes has been completed. Preliminary results can be found in CV Proc through chart review.   11/14/21 10:06 AM William [name] RVT

## 2021-11-14 NOTE — Progress Notes (Signed)
Patient transferred to [facility]. Report given. All belongings w/ patient. Attempted to contact spouse via telephone, left HIPPA compliant voicemail.

## 2021-11-14 NOTE — Progress Notes (Signed)
PROGRESS NOTE    William [name]  VEL:[id number] DOB: 08-24-1941 DOA: 11/17/2021 PCP: [name], MD    Brief Narrative:  William [name] is a 80 year old male with past medical history significant for bladder cancer s/p robotic assisted laparoscopic radical cystectomy/prostatectomy, bilateral pelvic lymph node dissection, extensive laparoscopic adhesiolysis, small bowel resection with ileal conduit urinary diversion 10/26/21 by [name], COPD, history of tobacco use disorder who presented to [facility] ED on 11/10 from urology clinic with finding of low blood pressure.  Following recent discharge, patient has been dealing with constipation with poor oral intake with darker urine.  Also complaining of increasing left lower quadrant pain and intermittent fevers at home.  Reports that his ileal conduit has been functioning normally.  Pain worsened to the point where his pain medications were not effective.  EMS was called on 11/9 but was not transported and was instructed to add naproxen to his pain regimen which seemed to give some relief.  Patient was seen in follow-up in the urology clinic on 1111 was found to have blood pressure in the 25E systolic and was referred to the ED for further assessment.  Patient recent hospitalized and underwent robotic assisted laparoscopic radical cystectomy/prostatectomy with bilateral pelvic lymph node dissection, extensive laparoscopic adhesion anxiolysis, small bowel resection and ileal conduit urinary diversion on 10/25/2021 by [name] for his invasive bladder cancer; hospital course was largely uneventful and was discharged on postop day 8 11/02/2021.  In the ED, SBP's in the 60s, treated with IV fluids and started on Levophed for blood pressure support.  Sodium 131, potassium 5.2, chloride 98, CO2 21, glucose 88, BUN 43, creatinine 2.87, anion gap 12, lactic acid 4.6, WBC 7, hemoglobin 9.2, platelets 364 with left shift.  Urinalysis from ileal conduit with  100 protein, large leukocytes, greater than 50 WBCs and many bacteria.  CT chest/abdomen/pelvis were notable for findings concerning for underlying ILD, coronary artery calcification, concern for small bowel obstruction/possible transition point in the lower abdomen/pelvis near surgical anastomosis with small/moderate volume ascites in the pelvis/left abdomen, small amount of free intraperitoneal air (?post-op), small to moderate amount of subcutaneous emphysema without accompanying fluid collection/stranding, cystoprostatectomy changes with right-sided urinary ostomy, bilateral ureteral stents extending through the ostomy with no hydronephrosis.  Patient was admitted to the PCCM service due to septic shock from likely urinary source.  Care transferred from [facility] to hospital service on 11/11/2021.   Assessment & Plan:   Principal Problem:   Septic shock ([place]) Active Problems:   Chronic pain   Bladder cancer ([place])   Bacteremia due to Gram-negative bacteria   UTI due to Klebsiella species   Septic shock, POA Klebsiella oxytoca UTI/septicemia, POA Patient presenting to ED from urology clinic after being found with systolic blood pressures in the 50s.  Patient was given IV fluid resuscitation without improvement and eventually placed on Levophed for vasopressor support.  Patient met septic shock criteria on admission with hypotension not improved with IV fluid resuscitation requiring Levophed in the setting of endorgan damage with acute renal failure, lactic acidosis with source of infection urinary tract with septicemia.  Patient was started on empiric antibiotics initially with vancomycin, cefepime, metronidazole and was de-escalated to ceftriaxone based on cultures. --Now titrated off of vasopressors --WBC 7.0>12.5>18.6>21.5>19.3>16.2 --PCT 41.64>32.48>18.72>4.67>2.62 --Blood cultures x2: Klebsiella oxytoca --Urine culture >100K Klebsiella oxytoca/Enterococcus faecalis --Unasyn 3g IV q6h, plan  7-day course --CBC daily  Small bowel obstruction CT abdomen/pelvis on admission with concern for small bowel obstruction with  possible transition point in the lower abdomen/pelvis near surgical anastomosis site.  General surgery believes this is likely secondary to inflammatory process from recent abdominal surgery and not fibrous/adhesion in nature; and recommend conservative management with NG tube and bowel rest. --General surgery/urology following, appreciate assistance --NGT removed today --Diet advanced to full liquids --Further per general surgery/urology  Acute renal failure: resolved Etiology likely secondary to prolonged prerenal state from dehydration and poor oral intake versus ATN from hypotension/septic shock as above. --Cr 2.87>3.00>1.67>1.21>1.04>0.90>0.86 --Discontinue IV fluids today with advancement of diet to full liquids --Avoid nephrotoxins, renal dose all medications --BMP daily  Invasive muscle bladder cancer s/p radical cystectomy, prostatectomy and ileal conduit Underwent robotic assisted laparoscopic radical cystectomy, robotic radical prostatectomy, bilateral pelvic lymph node dissection, extensive laparoscopic adhesiolysis, small bowel resection and ileal conduit urinary diversion on 10/25/2021 by [name] of urology. --Urology following, appreciate assistance --Continue ostomy care; monitor urinary output  COPD History of tobacco abuse --Incruse Ellipta 1 puff daily --DuoNebs as needed --Currently on room air  Normocytic anemia 9.2>9.5>9.2>8.8>9.4>9.2>8.9, Stable --Transfuse for hemoglobin less than 7.0 or active bleeding --CBC daily  Suspected ILD Noted on CT imaging this admission.  History of tobacco abuse. --Flonase --Outpatient pulmonology follow-up  Chronic back pain At baseline on gabapentin, oxycodone, Zanaflex. --Holding home oral medicines due to small bowel obstruction --Morphine 2-71m IV q4h  PRN  Weakness/deconditioning/debility: --Needs increased mobility, OOB to chair BID --PT/OT recommending home health on discharge.  DVT prophylaxis: heparin injection 5,000 Units Start: 11/11/2021 1515 SCDs Start: 11/26/2021 1502   Code Status: Full Code Family Communication: Updated family present at bedside this morning  Disposition Plan:  Level of care: Med-Surg Status is: Inpatient  Remains inpatient appropriate because: Continues on IV antibiotics, NG tube removed today, general surgery advancing diet, needs to demonstrate toleration of diet before able discharge home.   Consultants:  PCCM - signed off 11/12 General surgery Urology  Procedures:  Central line placement 11/10  Antimicrobials:  Ceftriaxone 11/11 - 11/13 Vancomycin 11/10 - 11/11 Cefepime 11/10 - 11/11 Metronidazole 11/10 - 11/10 Unasyn 11/13>>   Subjective: Patient seen examined at bedside, resting comfortably.  Family present at bedside.  Seen by general surgery this morning, NG tube discontinued and advancing diet to full liquids today.  Continues on IV antibiotics.  Patient hopeful for discharge home soon if able to tolerate diet.  White count improved.  No other questions or concerns at this time.   Denies headache, no chest pain, no palpitations, no shortness of breath, no abdominal pain, no current fever/chills/night sweats, no nausea/vomiting/diarrhea.  No acute concerns overnight per nursing staff.  Objective: Vitals:   11/13/21 0915 11/13/21 1411 11/13/21 2016 11/14/21 0441  BP:  136/81 (!) 149/87 135/87  Pulse:  (!) 102 (!) 110 (!) 102  Resp:  _0 Temp:  98.6 F (37 C) 98.1 F (36.7 C) 98 F (36.7 C)  TempSrc:   Oral Oral  SpO2: 97% 95% 97% 94%  Weight:      Height:        Intake/Output Summary (Last 24 hours) at 11/14/2021 1314 Last data filed at 11/14/2021 1235 Gross per 24 hour  Intake 2589.22 ml  Output 1550 ml  Net 1039.22 ml   Filed Weights   11/11/21 0500 11/12/21 0500  11/13/21 0603  Weight: 77.5 kg 80 kg 83.7 kg    Examination:  General exam: Appears calm and comfortable, chronically ill in appearance Respiratory system: Clear to auscultation. Respiratory effort normal.  On room air Cardiovascular system: S1 & S2 heard, RRR. No JVD, murmurs, rubs, gallops or clicks. No pedal edema. Gastrointestinal system: Abdomen with mild distention, soft, mild generalized tenderness to palpation. No organomegaly or masses felt.  No appreciable bowel sounds.  Urostomy noted with yellow urine with sediment in collection bag Central nervous system: Alert and oriented. No focal neurological deficits. Extremities: Symmetric 5 x 5 power. Skin: No rashes, lesions or ulcers Psychiatry: Judgement and insight appear normal. Mood & affect appropriate.     Data Reviewed: I have personally reviewed following labs and imaging studies  CBC: Recent Labs  Lab 10/31/2021 1016 11/22/2021 1029 11/10/21 0514 11/11/21 0542 11/12/21 0319 11/13/21 0319 11/14/21 0313  WBC 7.0  --  12.5* 18.6* 21.5* 19.3* 16.2*  NEUTROABS 6.2  --   --   --   --   --   --   HGB 9.2*   < > 9.2* 8.8* 9.4* 9.2* 8.9*  HCT 28.2*   < > 28.1* 27.5* 29.2* 28.8* 28.2*  MCV 91.6  --  90.1 92.9 90.7 92.0 92.2  PLT 364  --  359 281 256 266 263   < > = values in this interval not displayed.   Basic Metabolic Panel: Recent Labs  Lab 11/10/21 0500 11/11/21 0542 11/12/21 0319 11/13/21 0319 11/14/21 0313  NA 134* 135 142 145 141  K 4.6 3.9 3.8 3.5 3.3*  CL 100 103 105 108 106  CO2 _0 GLUCOSE 87 89 105* 119* 119*  BUN 35* 31* 26* 23 22  CREATININE 1.67* 1.21 1.04 0.90 0.84  CALCIUM 7.4* 7.5* 7.9* 7.7* 7.5*  MG 2.0  --  2.4  --  2.1  PHOS 3.7  --  3.0  --   --    GFR: Estimated Creatinine Clearance: 73.7 mL/min (by C-G formula based on SCr of 0.84 mg/dL). Liver Function Tests: Recent Labs  Lab 11/11/2021 1016  AST 49*  ALT 21  ALKPHOS 57  BILITOT 1.0  PROT 5.4*  ALBUMIN 2.1*   No  results for input(s): LIPASE, AMYLASE in the last 168 hours. No results for input(s): AMMONIA in the last 168 hours. Coagulation Profile: Recent Labs  Lab 11/03/2021 1016 11/10/21 0500  INR 1.4* 1.5*   Cardiac Enzymes: No results for input(s): CKTOTAL, CKMB, CKMBINDEX, TROPONINI in the last 168 hours. BNP (last 3 results) No results for input(s): PROBNP in the last 8760 hours. HbA1C: No results for input(s): HGBA1C in the last 72 hours. CBG: Recent Labs  Lab 11/11/21 1236 11/11/21 1603 11/11/21 1954 11/11/21 2348 11/12/21 0358  GLUCAP 86 87 109* 109* 100*   Lipid Profile: No results for input(s): CHOL, HDL, LDLCALC, TRIG, CHOLHDL, LDLDIRECT in the last 72 hours. Thyroid Function Tests: No results for input(s): TSH, T4TOTAL, FREET4, T3FREE, THYROIDAB in the last 72 hours. Anemia Panel: No results for input(s): VITAMINB12, FOLATE, FERRITIN, TIBC, IRON, RETICCTPCT in the last 72 hours. Sepsis Labs: Recent Labs  Lab 11/04/2021 1016 11/27/2021 1216 11/25/2021 1749 11/27/2021 1749 11/10/21 0500 11/11/21 0542 11/13/21 0319 11/14/21 0313  PROCALCITON  --   --  41.64   < > 32.48 18.72 4.67 2.62  LATICACIDVEN 4.6* 4.5* 4.8*  --   --   --   --   --    < > = values in this interval not displayed.    Recent Results (from the past 240 hour(s))  Blood Culture (routine x 2)     Status: Abnormal  Collection Time: 11/25/2021 10:16 AM   Specimen: BLOOD RIGHT ARM  Result Value Ref Range Status   Specimen Description BLOOD RIGHT ARM  Final   Special Requests   Final    BOTTLES DRAWN AEROBIC AND ANAEROBIC Blood Culture adequate volume   Culture  Setup Time   Final    GRAM NEGATIVE RODS IN BOTH AEROBIC AND ANAEROBIC BOTTLES CRITICAL RESULT CALLED TO, READ BACK BY AND VERIFIED WITH: M LILLISTON,PHARMD_[email or web address]  11/10/21 [place] Performed at [facility], [place] [address] [place], [place] [address]    Culture KLEBSIELLA OXYTOCA (A)  Final   Report Status 11/12/2021 FINAL  Final   Organism ID,  Bacteria KLEBSIELLA OXYTOCA  Final      Susceptibility   Klebsiella oxytoca - MIC*    AMPICILLIN >=32 RESISTANT Resistant     CEFAZOLIN 8 SENSITIVE Sensitive     CEFEPIME <=0.12 SENSITIVE Sensitive     CEFTAZIDIME <=1 SENSITIVE Sensitive     CEFTRIAXONE <=0.25 SENSITIVE Sensitive     CIPROFLOXACIN <=0.25 SENSITIVE Sensitive     GENTAMICIN <=1 SENSITIVE Sensitive     IMIPENEM <=0.25 SENSITIVE Sensitive     TRIMETH/SULFA <=20 SENSITIVE Sensitive     AMPICILLIN/SULBACTAM 8 SENSITIVE Sensitive     PIP/TAZO <=4 SENSITIVE Sensitive     * KLEBSIELLA OXYTOCA  Urine Culture     Status: Abnormal   Collection Time: 11/11/2021 10:16 AM   Specimen: In/Out Cath Urine  Result Value Ref Range Status   Specimen Description   Final    IN/OUT CATH URINE Performed at [place] [address] [place], [place] [address]    Special Requests   Final    NONE Performed at [facility], [place] [address] [place], [place] [address]    Culture (A)  Final    >=100,000 COLONIES/mL KLEBSIELLA OXYTOCA >=100,000 COLONIES/mL ENTEROCOCCUS FAECALIS    Report Status 11/12/2021 FINAL  Final   Organism ID, Bacteria KLEBSIELLA OXYTOCA (A)  Final   Organism ID, Bacteria ENTEROCOCCUS FAECALIS (A)  Final      Susceptibility   Enterococcus faecalis - MIC*    AMPICILLIN <=2 SENSITIVE Sensitive     NITROFURANTOIN <=16 SENSITIVE Sensitive     VANCOMYCIN 1 SENSITIVE Sensitive     * >=100,000 COLONIES/mL ENTEROCOCCUS FAECALIS   Klebsiella oxytoca - MIC*    AMPICILLIN >=32 RESISTANT Resistant     CEFAZOLIN 8 SENSITIVE Sensitive     CEFEPIME <=0.12 SENSITIVE Sensitive     CEFTRIAXONE <=0.25 SENSITIVE Sensitive     CIPROFLOXACIN <=0.25 SENSITIVE Sensitive     GENTAMICIN <=1 SENSITIVE Sensitive     IMIPENEM <=0.25 SENSITIVE Sensitive     NITROFURANTOIN 32 SENSITIVE Sensitive     TRIMETH/SULFA <=20 SENSITIVE Sensitive     AMPICILLIN/SULBACTAM 8 SENSITIVE Sensitive     PIP/TAZO <=4  SENSITIVE Sensitive     * >=100,000 COLONIES/mL KLEBSIELLA OXYTOCA  Blood Culture ID Panel (Reflexed)     Status: Abnormal   Collection Time: 11/12/2021 10:16 AM  Result Value Ref Range Status   Enterococcus faecalis NOT DETECTED NOT DETECTED Final   Enterococcus Faecium NOT DETECTED NOT DETECTED Final   Listeria monocytogenes NOT DETECTED NOT DETECTED Final   Staphylococcus species NOT DETECTED NOT DETECTED Final   Staphylococcus aureus (BCID) NOT DETECTED NOT DETECTED Final   Staphylococcus epidermidis NOT DETECTED NOT DETECTED Final   Staphylococcus lugdunensis NOT DETECTED NOT DETECTED Final   Streptococcus species NOT DETECTED NOT DETECTED Final   Streptococcus  agalactiae NOT DETECTED NOT DETECTED Final   Streptococcus pneumoniae NOT DETECTED NOT DETECTED Final   Streptococcus pyogenes NOT DETECTED NOT DETECTED Final   A.calcoaceticus-baumannii NOT DETECTED NOT DETECTED Final   Bacteroides fragilis NOT DETECTED NOT DETECTED Final   Enterobacterales DETECTED (A) NOT DETECTED Final    Comment: Enterobacterales represent a large order of gram negative bacteria, not a single organism. CRITICAL RESULT CALLED TO, READ BACK BY AND VERIFIED WITH: M LILLISTON,PHARMD_0  11/10/21 [place]    Enterobacter cloacae complex NOT DETECTED NOT DETECTED Final   Escherichia coli NOT DETECTED NOT DETECTED Final   Klebsiella aerogenes NOT DETECTED NOT DETECTED Final   Klebsiella oxytoca DETECTED (A) NOT DETECTED Final    Comment: CRITICAL RESULT CALLED TO, READ BACK BY AND VERIFIED WITH: M LILLISTON,PHARMD_1  11/10/21 [place]    Klebsiella pneumoniae NOT DETECTED NOT DETECTED Final   Proteus species NOT DETECTED NOT DETECTED Final   Salmonella species NOT DETECTED NOT DETECTED Final   Serratia marcescens NOT DETECTED NOT DETECTED Final   Haemophilus influenzae NOT DETECTED NOT DETECTED Final   Neisseria meningitidis NOT DETECTED NOT DETECTED Final   Pseudomonas aeruginosa NOT DETECTED NOT DETECTED Final    Stenotrophomonas maltophilia NOT DETECTED NOT DETECTED Final   Candida albicans NOT DETECTED NOT DETECTED Final   Candida auris NOT DETECTED NOT DETECTED Final   Candida glabrata NOT DETECTED NOT DETECTED Final   Candida krusei NOT DETECTED NOT DETECTED Final   Candida parapsilosis NOT DETECTED NOT DETECTED Final   Candida tropicalis NOT DETECTED NOT DETECTED Final   Cryptococcus neoformans/gattii NOT DETECTED NOT DETECTED Final   CTX-M ESBL NOT DETECTED NOT DETECTED Final   Carbapenem resistance IMP NOT DETECTED NOT DETECTED Final   Carbapenem resistance KPC NOT DETECTED NOT DETECTED Final   Carbapenem resistance NDM NOT DETECTED NOT DETECTED Final   Carbapenem resist OXA 48 LIKE NOT DETECTED NOT DETECTED Final   Carbapenem resistance VIM NOT DETECTED NOT DETECTED Final    Comment: Performed at [facility], [address], [place], [place] [address]  Blood Culture (routine x 2)     Status: Abnormal   Collection Time: 11/02/2021 10:21 AM   Specimen: BLOOD RIGHT WRIST  Result Value Ref Range Status   Specimen Description   Final    BLOOD RIGHT WRIST Performed at [facility], [address] [place], [place] [address]    Special Requests   Final    BOTTLES DRAWN AEROBIC ONLY Blood Culture adequate volume Performed at [place] [address] [place], [place] [address]    Culture  Setup Time   Final    GRAM NEGATIVE RODS AEROBIC BOTTLE ONLY CRITICAL VALUE NOTED.  VALUE IS CONSISTENT WITH PREVIOUSLY REPORTED AND CALLED VALUE.    Culture (A)  Final    KLEBSIELLA OXYTOCA SUSCEPTIBILITIES PERFORMED ON PREVIOUS CULTURE WITHIN THE LAST 5 DAYS. Performed at [facility], [place] [address], [place], [place] [address]    Report Status 11/12/2021 FINAL  Final  Resp Panel by RT-PCR (Flu A&B, Covid) Nasopharyngeal Swab     Status: None   Collection Time: 11/19/2021 11:18 AM   Specimen: Nasopharyngeal Swab; Nasopharyngeal(NP) swabs in vial transport medium   Result Value Ref Range Status   SARS Coronavirus 2 by RT PCR NEGATIVE NEGATIVE Final    Comment: (NOTE) SARS-CoV-2 target nucleic acids are NOT DETECTED.  The SARS-CoV-2 RNA is generally detectable in upper respiratory specimens during the acute phase of infection. The lowest concentration  of SARS-CoV-2 viral copies this assay can detect is 138 copies/mL. A negative result does not preclude SARS-Cov-2 infection and should not be used as the sole basis for treatment or other patient management decisions. A negative result may occur with  improper specimen collection/handling, submission of specimen other than nasopharyngeal swab, presence of viral mutation(s) within the areas targeted by this assay, and inadequate number of viral copies(<138 copies/mL). A negative result must be combined with clinical observations, patient history, and epidemiological information. The expected result is Negative.  Fact Sheet for Patients:  EntrepreneurPulse.com.au  Fact Sheet for Healthcare Providers:  IncredibleEmployment.be  This test is no t yet approved or cleared by the [place] FDA and  has been authorized for detection and/or diagnosis of SARS-CoV-2 by FDA under an Emergency Use Authorization (EUA). This EUA will remain  in effect (meaning this test can be used) for the duration of the COVID-19 declaration under Section 564(b)(1) of the Act, 21 U.S.C.section 360bbb-3(b)(1), unless the authorization is terminated  or revoked sooner.       Influenza A by PCR NEGATIVE NEGATIVE Final   Influenza B by PCR NEGATIVE NEGATIVE Final    Comment: (NOTE) The Xpert Xpress SARS-CoV-2/FLU/RSV plus assay is intended as an aid in the diagnosis of influenza from Nasopharyngeal swab specimens and should not be used as a sole basis for treatment. Nasal washings and aspirates are unacceptable for Xpert Xpress SARS-CoV-2/FLU/RSV testing.  Fact Sheet for  Patients: EntrepreneurPulse.com.au  Fact Sheet for Healthcare Providers: IncredibleEmployment.be  This test is not yet approved or cleared by the [place] FDA and has been authorized for detection and/or diagnosis of SARS-CoV-2 by FDA under an Emergency Use Authorization (EUA). This EUA will remain in effect (meaning this test can be used) for the duration of the COVID-19 declaration under Section 564(b)(1) of the Act, 21 U.S.C. section 360bbb-3(b)(1), unless the authorization is terminated or revoked.  Performed at [facility], [place] [address] [place], [place] [address]          Radiology Studies: DG Abd Portable 1V  Result Date: 11/12/2021 CLINICAL DATA:  NG tube placement EXAM: PORTABLE ABDOMEN - 1 VIEW COMPARISON:  11/12/2021 at 0744 hours FINDINGS: Enteric tube terminates in the proximal gastric fundus. Bilateral ureteral stents. Lumbar spine fixation hardware. IMPRESSION: Enteric tube terminates in the proximal gastric fundus. Electronically Signed   By: Julian Hy M.D.   On: 11/12/2021 19:15   VAS [place] LOWER EXTREMITY VENOUS (DVT)  Result Date: 11/14/2021  Lower Venous DVT Study Patient Name:  William [name]  Date of Exam:   11/14/2021 Medical Rec #: [id number]       Accession #:    [id number] Date of Birth: 12-12-1941       Patient Gender: M Patient Age:   2 years Exam Location:  [facility] Procedure:      VAS [place] LOWER EXTREMITY VENOUS (DVT) Referring Phys: Margie Billet --------------------------------------------------------------------------------  Indications: Swelling.  Risk Factors: None identified. Limitations: Poor ultrasound/tissue interface. Comparison Study: No prior studies. Performing Technologist: Oliver Hum RVT  Examination Guidelines: A complete evaluation includes B-mode imaging, spectral Doppler, color Doppler, and power Doppler as needed of all accessible portions of each vessel.  Bilateral testing is considered an integral part of a complete examination. Limited examinations for reoccurring indications may be performed as noted. The reflux portion of the exam is performed with the patient in reverse Trendelenburg.  +---------+---------------+---------+-----------+----------+--------------+ RIGHT    CompressibilityPhasicitySpontaneityPropertiesThrombus Aging +---------+---------------+---------+-----------+----------+--------------+ CFV  Full           Yes      Yes                                 +---------+---------------+---------+-----------+----------+--------------+ SFJ      Full                                                        +---------+---------------+---------+-----------+----------+--------------+ FV Prox  Full                                                        +---------+---------------+---------+-----------+----------+--------------+ FV Mid                  Yes      Yes                                 +---------+---------------+---------+-----------+----------+--------------+ FV Distal               Yes      Yes                                 +---------+---------------+---------+-----------+----------+--------------+ PFV      Full                                                        +---------+---------------+---------+-----------+----------+--------------+ POP      Full           Yes      Yes                                 +---------+---------------+---------+-----------+----------+--------------+ PTV      Full                                                        +---------+---------------+---------+-----------+----------+--------------+ PERO     Full                                                        +---------+---------------+---------+-----------+----------+--------------+   +---------+---------------+---------+-----------+----------+-------------------+ LEFT      CompressibilityPhasicitySpontaneityPropertiesThrombus Aging      +---------+---------------+---------+-----------+----------+-------------------+ CFV      Full           Yes      Yes                                      +---------+---------------+---------+-----------+----------+-------------------+  SFJ      Full                                                             +---------+---------------+---------+-----------+----------+-------------------+ FV Prox  Full                                                             +---------+---------------+---------+-----------+----------+-------------------+ FV Mid   Full                                                             +---------+---------------+---------+-----------+----------+-------------------+ FV Distal               Yes      Yes                                      +---------+---------------+---------+-----------+----------+-------------------+ PFV      Full                                                             +---------+---------------+---------+-----------+----------+-------------------+ POP      Full           Yes      Yes                                      +---------+---------------+---------+-----------+----------+-------------------+ PTV      Full                                                             +---------+---------------+---------+-----------+----------+-------------------+ PERO                                                  Not well visualized +---------+---------------+---------+-----------+----------+-------------------+    Summary: RIGHT: - There is no evidence of deep vein thrombosis in the lower extremity. However, portions of this examination were limited- see technologist comments above.  - No cystic structure found in the popliteal fossa.  LEFT: - There is no evidence of deep vein thrombosis in the lower extremity. However, portions of this  examination were limited- see technologist comments above.  - No cystic structure found in the popliteal fossa.  *See table(s) above for measurements and observations.    Preliminary  VAS [place] UPPER EXTREMITY VENOUS DUPLEX  Result Date: 11/14/2021 UPPER VENOUS STUDY  Patient Name:  William [name]  Date of Exam:   11/14/2021 Medical Rec #: [id number]       Accession #:    [id number] Date of Birth: 1941-03-08       Patient Gender: M Patient Age:   80 years Exam Location:  [facility] Procedure:      VAS [place] UPPER EXTREMITY VENOUS DUPLEX Referring Phys: Margie Billet --------------------------------------------------------------------------------  Indications: Edema Risk Factors: None identified. Limitations: Poor ultrasound/tissue interface and line. Comparison Study: No prior studies. Performing Technologist: Oliver Hum RVT  Examination Guidelines: A complete evaluation includes B-mode imaging, spectral Doppler, color Doppler, and power Doppler as needed of all accessible portions of each vessel. Bilateral testing is considered an integral part of a complete examination. Limited examinations for reoccurring indications may be performed as noted.  Right Findings: +----------+------------+---------+-----------+----------+-------+ RIGHT     CompressiblePhasicitySpontaneousPropertiesSummary +----------+------------+---------+-----------+----------+-------+ IJV           Full       Yes       Yes                      +----------+------------+---------+-----------+----------+-------+ Subclavian    Full       Yes       Yes                      +----------+------------+---------+-----------+----------+-------+ Axillary      Full       Yes       Yes                      +----------+------------+---------+-----------+----------+-------+ Brachial      Full       Yes       Yes                      +----------+------------+---------+-----------+----------+-------+ Radial         Full                                          +----------+------------+---------+-----------+----------+-------+ Ulnar         Full                                          +----------+------------+---------+-----------+----------+-------+ Cephalic      Full                                          +----------+------------+---------+-----------+----------+-------+ Basilic       Full                                          +----------+------------+---------+-----------+----------+-------+  Left Findings: +----------+------------+---------+-----------+----------+-------+ LEFT      CompressiblePhasicitySpontaneousPropertiesSummary +----------+------------+---------+-----------+----------+-------+ IJV           Full       Yes       Yes                      +----------+------------+---------+-----------+----------+-------+  Subclavian    Full       Yes       Yes                      +----------+------------+---------+-----------+----------+-------+ Axillary      Full       Yes       Yes                      +----------+------------+---------+-----------+----------+-------+ Brachial      Full       Yes       Yes                      +----------+------------+---------+-----------+----------+-------+ Radial        Full                                          +----------+------------+---------+-----------+----------+-------+ Ulnar         Full                                          +----------+------------+---------+-----------+----------+-------+ Cephalic      Full                                          +----------+------------+---------+-----------+----------+-------+ Basilic       Full                                          +----------+------------+---------+-----------+----------+-------+  Summary:  Right: No evidence of deep vein thrombosis in the upper extremity. No evidence of superficial vein thrombosis in the upper  extremity.  Left: No evidence of deep vein thrombosis in the upper extremity. No evidence of superficial vein thrombosis in the upper extremity.  *See table(s) above for measurements and observations.     Preliminary         Scheduled Meds:  chlorhexidine  15 mL Mouth Rinse BID   feeding supplement  1 Container Oral TID BM   fluticasone  2 spray Each Nare Daily   heparin  5,000 Units Subcutaneous Q8H   lip balm  1 application Topical BID   mouth rinse  15 mL Mouth Rinse q12n4p   potassium chloride  40 mEq Oral Once   sodium chloride flush  10-40 mL Intracatheter Q12H   thiamine injection  100 mg Intravenous Daily   umeclidinium bromide  1 puff Inhalation Daily   Continuous Infusions:  sodium chloride Stopped (11/13/21 0313)   ampicillin-sulbactam (UNASYN) IV Stopped (11/14/21 1037)   chlorproMAZINE (THORAZINE) IV     dextrose 5% lactated ringers Stopped (11/14/21 1134)   ondansetron (ZOFRAN) IV       LOS: 5 days    Time spent: 39 minutes spent on chart review, discussion with nursing staff, consultants, updating family and interview/physical exam; more than 50% of that time was spent in counseling and/or coordination of care.    Shareen Capwell J [place] DO [facility] Available via Epic secure chat 7am-7pm After these hours, please refer to coverage provider listed on [email or web address] 11/14/2021,  1:14 PM

## 2021-11-14 NOTE — Progress Notes (Signed)
Patient experiencing vomiting after small amts clear liquid intake. Notified MD via message. Informed patient to stop taking intake for time being until nausea subsides or MD instructs otherwise. Patient verbalizes understanding.

## 2021-11-14 NOTE — Consult Note (Addendum)
  [place] Nurse ostomy consult note Stoma type/location: RLQ ileal conduit Stomal assessment/size: Red and viable, slightly above skin level, 1 and 1/8 inch round with 2 stents intact Peristomal assessment: intact Treatment options for stomal/peristomal skin: skin barrier ring, convex pouch. Output: mod amt yellow urine Ostomy pouching: 1pc. Convex urostomy pouch with skin barrier ring  Education provided: Pt states he has been independent with emptying prior to admission.  Wife at bedside states she has been independent with pouch application and attaching/removing urine drainage bag prior to admission.  She is able to demonstrate all these skills.  Applied barrier ring and one piece convex pouch.  Left spout turned off and unattached to drainage bag as requested. Reviewed pouching routines and ordering supplies.  4 sets of barrier rings/pouches/ 1 adapter and 1 bedside drainage bag left at the bedisde for use after discharge.  Pt and wife deny further questions.  Enrolled patient in [employer] program: No, accomplished during last admission  Use supplies: Pouch is Kellie Simmering # [id number], skin barrier ring [place] MSN, RN, [place], [place], [place]

## 2021-11-14 NOTE — Progress Notes (Signed)
Physical Therapy Treatment Patient Details Name: William [name] MRN: [id number] DOB: August 02, 1941 Today's Date: 11/14/2021   History of Present Illness Patient  is a 80 year old male who presented from urology clinic on 11/10 with low blood pressure. patient was found to have septic shock. PMH: s/p robotic cystoprostatectomy, pelvic node dissection , conduit diversion, extensive adhesioloysis , small bowel resection and cysto / ICG injection on 10/25/21 due to bladder cancer, tinnitus, Covid-19, back surgery    PT Comments    Patient progressing with ambulation distance and standing balance.  Patient with constant hiccups throughout session requesting medication for relief and RN aware, but waiting for IV team due to pt's IV burning and meds are IV.  Patient remains appropriate for HHPT at d/c.  PT to continue to follow.    Recommendations for follow up therapy are one component of a multi-disciplinary discharge planning process, led by the attending physician.  Recommendations may be updated based on patient status, additional functional criteria and insurance authorization.  Follow Up Recommendations  Home health PT     Assistance Recommended at Discharge Set up Supervision/Assistance  Equipment Recommendations  None recommended by PT    Recommendations for Other Services       Precautions / Restrictions Precautions Precautions: Fall Precaution Comments: urostomy Restrictions Weight Bearing Restrictions: No     Mobility  Bed Mobility               General bed mobility comments: pt in recliner    Transfers Overall transfer level: Needs assistance Equipment used: Rolling walker (2 wheels)   Sit to Stand: Min assist           General transfer comment: assist for balance up from recliner with increased time and heavy UE reliance    Ambulation/Gait Ambulation/Gait assistance: Min guard Gait Distance (Feet): 95 Feet Assistive device: Rolling walker (2  wheels) Gait Pattern/deviations: Step-through pattern;Decreased stride length;Wide base of support       General Gait Details: HR 127, SpO2 95% on RA with ambulation.  Slow pace initially, on the way back picked up pace, forward gait with close S to minguard, backing up to [facility] then to chair with minguard for safety mild posterior bias.   Stairs             Wheelchair Mobility    Modified Rankin (Stroke Patients Only)       Balance Overall balance assessment: Needs assistance   Sitting balance-Leahy Scale: Good     Standing balance support: Reliant on assistive device for balance Standing balance-Leahy Scale: Poor Standing balance comment: able to stand with RW while PT obtained 3:1 from bathroom; assist for dynamic tasks                            Cognition Arousal/Alertness: Awake/alert Behavior During Therapy: WFL for tasks assessed/performed Overall Cognitive Status: Within Functional Limits for tasks assessed                                 General Comments: slower processing, distracted by hiccups and pain of IV burning        Exercises      General Comments General comments (skin integrity, edema, etc.): toileted on BSC, assist for hygiene and cream applied due to errythema      Pertinent Vitals/Pain Faces Pain Scale: Hurts even more Pain Location: IV  site Pain Descriptors / Indicators: Burning Pain Intervention(s): Monitored during session;Repositioned (RN aware)    Home Living                          Prior Function            PT Goals (current goals can now be found in the care plan section) Progress towards PT goals: Progressing toward goals    Frequency    Min 3X/week      PT Plan Current plan remains appropriate    Co-evaluation              AM-PAC PT "6 Clicks" Mobility   Outcome Measure  Help needed turning from your back to your side while in a flat bed without using bedrails?: A  Little Help needed moving from lying on your back to sitting on the side of a flat bed without using bedrails?: A Little Help needed moving to and from a bed to a chair (including a wheelchair)?: A Little Help needed standing up from a chair using your arms (e.g., wheelchair or bedside chair)?: A Little Help needed to walk in hospital room?: A Little Help needed climbing 3-5 steps with a railing? : A Little 6 Click Score: 18    End of Session Equipment Utilized During Treatment: Gait belt Activity Tolerance: Patient tolerated treatment well Patient left: in chair;with call bell/phone within reach   PT Visit Diagnosis: Difficulty in walking, not elsewhere classified (R26.2);Muscle weakness (generalized) (M62.81)     Time: 1694-5038 PT Time Calculation (min) (ACUTE ONLY): 27 min  Charges:  $Gait Training: 8-22 mins $Therapeutic Activity: 8-22 mins                     [name], PT Acute Rehabilitation Services Pager:[phone] Office:[phone] 11/14/2021    [name] 11/14/2021, 1:13 PM

## 2021-11-14 NOTE — Progress Notes (Addendum)
Patient ID: William [name], male   DOB: 1941-02-14, 80 y.o.   MRN: [id number] [facility] Surgery Progress Note     Subjective: CC-  Up in chair. Wants to walk more. Overall feeling better. Passing flatus and had a large BM yesterday. Another BM this morning. Tolerating clear liquids. Denies n/v but does have hiccups.  Objective: Vital signs in last 24 hours: Temp:  [98 F (36.7 C)-98.6 F (37 C)] 98 F (36.7 C) (11/15 0441) Pulse Rate:  [102-110] 102 (11/15 0441) Resp:  [17-18] 17 (11/15 0441) BP: (135-149)/(81-87) 135/87 (11/15 0441) SpO2:  [94 %-97 %] 94 % (11/15 0441) Last BM Date: 11/04/21  Intake/Output from previous day: 11/14 0701 - 11/15 0700 In: 2208 [P.O.:760; I.V.:1037.5; IV Piggyback:410.6] Out: 1350 [Urine:1350] Intake/Output this shift: No intake/output data recorded.  PE: Gen:  Alert, NAD Card:  Regular rate and rhythm, pitting upper and lower extremity edema  Pulm:  rate and effort normal Abd: Soft, mild distention, nontender, RLQ urostomy viable with cloudy drainage, midline incision c/d/I. Skin: warm and dry, no rashes or lesions Psych: A&Ox3  Lab Results:  Recent Labs    11/13/21 0319 11/14/21 0313  WBC 19.3* 16.2*  HGB 9.2* 8.9*  HCT 28.8* 28.2*  PLT 266 263   BMET Recent Labs    11/13/21 0319 11/14/21 0313  NA 145 141  K 3.5 3.3*  CL 108 106  CO2 29 27  GLUCOSE 119* 119*  BUN 23 22  CREATININE 0.90 0.84  CALCIUM 7.7* 7.5*   PT/INR No results for input(s): LABPROT, INR in the last 72 hours. CMP     Component Value Date/Time   NA 141 11/14/2021 0313   NA 137 09/21/2020 0942   K 3.3 (L) 11/14/2021 0313   CL 106 11/14/2021 0313   CO2 27 11/14/2021 0313   GLUCOSE 119 (H) 11/14/2021 0313   BUN 22 11/14/2021 0313   BUN 5 (L) 09/21/2020 0942   CREATININE 0.84 11/14/2021 0313   CREATININE 0.72 06/09/2021 1232   CREATININE 0.90 02/07/2017 1032   CALCIUM 7.5 (L) 11/14/2021 0313   PROT 5.4 (L) 11/12/2021 1016   PROT 7.2  03/10/2021 0845   ALBUMIN 2.1 (L) 11/21/2021 1016   ALBUMIN 4.4 03/10/2021 0845   AST 49 (H) 11/11/2021 1016   AST 22 06/09/2021 1232   ALT 21 11/04/2021 1016   ALT 10 06/09/2021 1232   ALKPHOS 57 11/20/2021 1016   BILITOT 1.0 11/12/2021 1016   BILITOT 0.4 06/09/2021 1232   GFRNONAA >60 11/14/2021 0313   GFRNONAA >60 06/09/2021 1232   GFRNONAA 84 02/07/2017 1032   GFRAA >60 09/23/2020 1133   GFRAA >89 02/07/2017 1032   Lipase  No results found for: LIPASE     Studies/Results: DG Abd Portable 1V  Result Date: 11/12/2021 CLINICAL DATA:  NG tube placement EXAM: PORTABLE ABDOMEN - 1 VIEW COMPARISON:  11/12/2021 at 0744 hours FINDINGS: Enteric tube terminates in the proximal gastric fundus. Bilateral ureteral stents. Lumbar spine fixation hardware. IMPRESSION: Enteric tube terminates in the proximal gastric fundus. Electronically Signed   By: Julian Hy M.D.   On: 11/12/2021 19:15    Anti-infectives: Anti-infectives (From admission, onward)    Start     Dose/Rate Route Frequency Ordered Stop   11/12/21 0800  Ampicillin-Sulbactam (UNASYN) 3 g in sodium chloride 0.9 % 100 mL IVPB        3 g 200 mL/hr over 30 Minutes Intravenous Every 6 hours 11/12/21 0712 11/18/21 2359  11/10/21 1400  cefTRIAXone (ROCEPHIN) 2 g in sodium chloride 0.9 % 100 mL IVPB  Status:  Discontinued        2 g 200 mL/hr over 30 Minutes Intravenous Daily 11/10/21 1048 11/12/21 0712   11/10/21 0800  ceFEPIme (MAXIPIME) 2 g in sodium chloride 0.9 % 100 mL IVPB  Status:  Discontinued        2 g 200 mL/hr over 30 Minutes Intravenous Every 12 hours 11/10/21 0618 11/10/21 1048   11/10/21 0630  ceFEPIme (MAXIPIME) 2 g in sodium chloride 0.9 % 100 mL IVPB  Status:  Discontinued        2 g 200 mL/hr over 30 Minutes Intravenous Every 24 hours 11/27/2021 1622 11/10/21 0618   11/06/2021 1624  vancomycin variable dose per unstable renal function (pharmacist dosing)  Status:  Discontinued         Does not apply See  admin instructions 11/24/2021 1624 11/10/21 0613   11/01/2021 1030  ceFEPIme (MAXIPIME) 2 g in sodium chloride 0.9 % 100 mL IVPB        2 g 200 mL/hr over 30 Minutes Intravenous  Once 11/05/2021 1016 11/22/2021 1133   11/03/2021 1030  metroNIDAZOLE (FLAGYL) IVPB 500 mg        500 mg 100 mL/hr over 60 Minutes Intravenous  Once 11/25/2021 1016 11/05/2021 1915   11/18/2021 1030  vancomycin (VANCOCIN) IVPB 1000 mg/200 mL premix  Status:  Discontinued        1,000 mg 200 mL/hr over 60 Minutes Intravenous  Once 11/23/2021 1016 11/02/2021 1023   11/15/2021 1030  vancomycin (VANCOREADY) IVPB 1500 mg/300 mL        1,500 mg 150 mL/hr over 120 Minutes Intravenous  Once 11/20/2021 1023 11/21/2021 1341        Assessment/Plan pSBO  vs ileus - s/p Robotic-assisted laparoscopic radical cystectomy, radical prostatectomy, Bilateral pelvic lymph node dissection, Extensive laparoscopic adhesiolysis, Small bowel resection, Ileal conduit urinary diversion 10/26/21 [name] - CT scan with SBO pattern, possibly a pSBO at the level of the surgical anastomosis  -  Ileus vs SBO seems to be improving -- having flatus and bowel movements and tolerating clear liquids. Advance to full liquids. Add Boost. Thorazine for hiccups. Continue mobilizing.   BUE/BLE edema - u/s ordered to look for DVT - notified primary team  Small volume pneumoperitoneum - reviewed with Dr. Johney Maine and [name] 11/10, suspect this is related to insufflation/post-operative state.   ID - maxipime/ flagyl/ vancomycin 11/10, switched to Rocephin then Unasyn based on urine cx per primary team VTE - SCDs, per primary FEN - FLD, Boost Foley - none. Urostomy/Ileal conduit    Septic shock - resolved Klebsiella Oxytoca UTI  Asthma/ COPD Chronic back pain HLD AKI Possible UTI   LOS: 5 days    Wellington Hampshire, [facility] 11/14/2021, 8:55 AM Please see Amion for pager number during day hours 7:00am-4:30pm

## 2021-11-14 NOTE — Progress Notes (Signed)
Occupational Therapy Treatment Patient Details Name: William [name] MRN: [id number] DOB: February 14, 1941 Today's Date: 11/14/2021   History of present illness Patient  is a 80 year old male who presented from urology clinic on 11/10 with low blood pressure. patient was found to have septic shock. PMH: s/p robotic cystoprostatectomy, pelvic node dissection , conduit diversion, extensive adhesioloysis , small bowel resection and cysto / ICG injection on 10/25/21 due to bladder cancer, tinnitus, Covid-19, back surgery   OT comments  Patient participated in toileting transfer with min guard from recliner with increased time. Patient was max A for hygiene tasks with education on importance of participation in task to prevent functional decline. Patient required increased encouragement to participate in self hygiene tasks. Patient's discharge plan remains appropriate at this time. OT will continue to follow acutely.     Recommendations for follow up therapy are one component of a multi-disciplinary discharge planning process, led by the attending physician.  Recommendations may be updated based on patient status, additional functional criteria and insurance authorization.    Follow Up Recommendations  No OT follow up    Assistance Recommended at Discharge Frequent or constant Supervision/Assistance  Equipment Recommendations  Other (comment)    Recommendations for Other Services      Precautions / Restrictions Precautions Precautions: Fall Precaution Comments: urostomy Restrictions Weight Bearing Restrictions: No       Mobility Bed Mobility               General bed mobility comments: pt in recliner    Transfers Overall transfer level: Needs assistance Equipment used: Rolling walker (2 wheels) Transfers: Sit to/from Stand Sit to Stand: Min guard Stand pivot transfers: Min guard         General transfer comment: assist for balance up from recliner with increased time and  heavy UE reliance     Balance Overall balance assessment: Needs assistance   Sitting balance-Leahy Scale: Good     Standing balance support: Reliant on assistive device for balance Standing balance-Leahy Scale: Poor Standing balance comment: able to stand with RW while PT obtained 3:1 from bathroom; assist for dynamic tasks                           ADL either performed or assessed with clinical judgement   ADL Overall ADL's : Needs assistance/impaired     Grooming: Wash/dry face;Set up;Sitting Grooming Details (indicate cue type and reason): sitting in recliner                 Toilet Transfer: Min guard;BSC/3in1;Rolling walker (2 wheels) Toilet Transfer Details (indicate cue type and reason): patient needed increased cues to complete turn Toileting- Clothing Manipulation and Hygiene: Sit to/from stand Toileting - Clothing Manipulation Details (indicate cue type and reason): patient was max A for hygiene with patient needing increased encouragement to participate in task. patient reported wife typically does this for him at home. patient was educated on importance of trying to participate in ADL tasks to maintain independence. patient was noted to reach further posteriorly in attempts to hand wash cloth to this writter to complete hygiene for him. patient was able to participate in last wipe with encouragement.            Extremity/Trunk Assessment Upper Extremity Assessment RUE Deficits / Details: noted to have edema in RUE with patient able to reach more than demonstrated initally. unable to compelte formal MMT with patients learned helplessness  Vision Patient Visual Report: No change from baseline     Perception     Praxis      Cognition Arousal/Alertness: Awake/alert Behavior During Therapy: WFL for tasks assessed/performed Overall Cognitive Status: Within Functional Limits for tasks assessed                                  General Comments: wife was present during session as wel.          Exercises Other Exercises Other Exercises: patient completed 5 sit to stands from recliner with min guard and encouragement to participate in task   Shoulder Instructions       General Comments toileted on BSC, assist for hygiene and cream applied due to errythema    Pertinent Vitals/ Pain       Pain Assessment: Faces Faces Pain Scale: Hurts even more Pain Location: abdomen and back Pain Descriptors / Indicators: Discomfort;Grimacing Pain Intervention(s): Monitored during session  Home Living                                          Prior Functioning/Environment              Frequency  Min 2X/week        Progress Toward Goals  OT Goals(current goals can now be found in the care plan section)  Progress towards OT goals: Progressing toward goals     Plan Discharge plan remains appropriate    Co-evaluation                 AM-PAC OT "6 Clicks" Daily Activity     Outcome Measure   Help from another person eating meals?: A Little Help from another person taking care of personal grooming?: A Little Help from another person toileting, which includes using toliet, bedpan, or urinal?: A Lot Help from another person bathing (including washing, rinsing, drying)?: A Lot Help from another person to put on and taking off regular upper body clothing?: A Little Help from another person to put on and taking off regular lower body clothing?: A Lot 6 Click Score: 15    End of Session Equipment Utilized During Treatment: Gait belt;Rolling walker (2 wheels)      Activity Tolerance Patient tolerated treatment well;Patient limited by fatigue   Patient Left in chair;with call bell/phone within reach;with chair alarm set;with family/visitor present   Nurse Communication          Time: [phone] OT Time Calculation (min): 23 min  Charges: OT General Charges $OT Visit: 1  Visit OT Treatments $Self Care/Home Management : 23-37 mins  Jackelyn Poling OTR/L, MS Acute Rehabilitation Department Office# [phone] Pager# [phone] 11/14/2021, 1:32 PM

## 2021-11-15 ENCOUNTER — Inpatient Hospital Stay (HOSPITAL_COMMUNITY): Payer: Medicare HMO

## 2021-11-15 DIAGNOSIS — A419 Sepsis, unspecified organism: Secondary | ICD-10-CM | POA: Diagnosis not present

## 2021-11-15 DIAGNOSIS — R6521 Severe sepsis with septic shock: Secondary | ICD-10-CM | POA: Diagnosis not present

## 2021-11-15 LAB — BASIC METABOLIC PANEL
Anion gap: 7 (ref 5–15)
Anion gap: 9 (ref 5–15)
BUN: 18 mg/dL (ref 8–23)
BUN: 17 mg/dL (ref 8–23)
CO2: 27 mmol/L (ref 22–32)
CO2: 25 mmol/L (ref 22–32)
Calcium: 7.4 mg/dL — ABNORMAL LOW (ref 8.9–10.3)
Calcium: 7.4 mg/dL — ABNORMAL LOW (ref 8.9–10.3)
Chloride: 106 mmol/L (ref 98–111)
Chloride: 103 mmol/L (ref 98–111)
Creatinine, Ser: 0.82 mg/dL (ref 0.61–1.24)
Creatinine, Ser: 0.83 mg/dL (ref 0.61–1.24)
GFR, Estimated: >60 mL/min (ref >60)
GFR, Estimated: >60 mL/min (ref >60)
Glucose, Bld: 116 mg/dL — ABNORMAL HIGH (ref 70–99)
Glucose, Bld: 108 mg/dL — ABNORMAL HIGH (ref 70–99)
Potassium: 2.9 mmol/L — ABNORMAL LOW (ref 3.5–5.1)
Potassium: 3.5 mmol/L (ref 3.5–5.1)
Sodium: 140 mmol/L (ref 135–145)
Sodium: 137 mmol/L (ref 135–145)

## 2021-11-15 LAB — CBC
HCT: 26 % — ABNORMAL LOW (ref 39.0–52.0)
Hemoglobin: 8.3 g/dL — ABNORMAL LOW (ref 13.0–17.0)
MCH: 29 pg (ref 26.0–34.0)
MCHC: 31.9 g/dL (ref 30.0–36.0)
MCV: 90.9 fL (ref 80.0–100.0)
Platelets: 283 10*3/uL (ref 150–400)
RBC: 2.86 MIL/uL — ABNORMAL LOW (ref 4.22–5.81)
RDW: 14.6 % (ref 11.5–15.5)
WBC: 16.1 10*3/uL — ABNORMAL HIGH (ref 4.0–10.5)
nRBC: 0 % (ref 0.0–0.2)

## 2021-11-15 LAB — IRON AND TIBC
Iron: 25 ug/dL — ABNORMAL LOW (ref 45–182)
Saturation Ratios: 15 % — ABNORMAL LOW (ref 17.9–39.5)
TIBC: 168 ug/dL — ABNORMAL LOW (ref 250–450)
UIBC: 143 ug/dL

## 2021-11-15 LAB — RETICULOCYTES
Immature Retic Fract: 46.2 % — ABNORMAL HIGH (ref 2.3–15.9)
RBC.: 2.99 MIL/uL — ABNORMAL LOW (ref 4.22–5.81)
Retic Count, Absolute: 80.1 10*3/uL (ref 19.0–186.0)
Retic Ct Pct: 2.7 % (ref 0.4–3.1)

## 2021-11-15 LAB — MAGNESIUM: Magnesium: 2 mg/dL (ref 1.7–2.4)

## 2021-11-15 LAB — FERRITIN: Ferritin: 517 ng/mL — ABNORMAL HIGH (ref 24–336)

## 2021-11-15 MED ORDER — BISACODYL 10 MG RE SUPP
10.0000 mg | Freq: Every day | RECTAL | Status: AC
  Administered 2021-11-15: 10 mg via RECTAL
  Filled 2021-11-15 (×2): qty 1

## 2021-11-15 MED ORDER — LACTATED RINGERS IV SOLN
INTRAVENOUS | Status: DC
Start: 2021-11-15 — End: 2021-11-16

## 2021-11-15 MED ORDER — SIMETHICONE 80 MG PO CHEW
80.0000 mg | CHEWABLE_TABLET | Freq: Four times a day (QID) | ORAL | Status: AC
  Administered 2021-11-15 – 2021-11-17 (×6): 80 mg via ORAL
  Filled 2021-11-15 (×9): qty 1

## 2021-11-15 MED ORDER — NYSTATIN 100000 UNIT/GM EX POWD
Freq: Two times a day (BID) | CUTANEOUS | Status: DC
  Administered 2021-11-22: 1 via TOPICAL
  Filled 2021-11-15 (×2): qty 15

## 2021-11-15 MED ORDER — HYDROMORPHONE HCL 1 MG/ML IJ SOLN
1.0000 mg | INTRAMUSCULAR | Status: DC | PRN
  Administered 2021-11-15 – 2021-11-17 (×6): 1 mg via INTRAVENOUS
  Filled 2021-11-15 (×6): qty 1

## 2021-11-15 MED ORDER — POTASSIUM CHLORIDE 10 MEQ/100ML IV SOLN
10.0000 meq | INTRAVENOUS | Status: AC
  Administered 2021-11-15 (×5): 10 meq via INTRAVENOUS
  Filled 2021-11-15 (×5): qty 100

## 2021-11-15 NOTE — Care Management Important Message (Signed)
Important Message  Patient Details IM Letter given to the Patient. Name: William [name] MRN: [id number] Date of Birth: 02-Aug-1941   Medicare Important Message Given:  Yes     Kerin Salen 11/15/2021, 12:13 PM

## 2021-11-15 NOTE — Progress Notes (Signed)
PHARMACY NOTE   Pharmacy has been assisting with dosing of Unasyn for septic shock secondary to UTI. Dosage remains stable at 3g IV q6h and need for further dosage adjustment appears unlikely at present.    Will sign off at this time.  Please reconsult if a change in clinical status warrants re-evaluation of dosage.  Lindell Spar, PharmD, BCPS 11/15/2021 10:43 AM

## 2021-11-15 NOTE — Progress Notes (Signed)
Patient ID: William [name], male   DOB: 04/19/1941, 80 y.o.   MRN: [id number] [facility] Surgery Progress Note     Subjective: CC-  Up in chair. Wants to walk more. Overall feeling better. Passing flatus and had a large BM yesterday. Another BM this morning. Tolerating clear liquids. Denies n/v but does have hiccups.  HR 106, normotensive WBC 16.1 (stable) Objective: Vital signs in last 24 hours: Temp:  [97.3 F (36.3 C)-99.5 F (37.5 C)] 99.5 F (37.5 C) (11/16 0658) Pulse Rate:  [87-110] 109 (11/16 0658) Resp:  [16-18] 18 (11/16 0658) BP: (125-140)/(58-89) 140/75 (11/16 0658) SpO2:  [93 %-97 %] 93 % (11/16 0658) Weight:  [87.4 kg] 87.4 kg (11/16 0500) Last BM Date: 11/14/21  Intake/Output from previous day: 11/15 0701 - 11/16 0700 In: 787.7 [I.V.:482.1; IV Piggyback:305.6] Out: 1700 [Urine:1600; Emesis/NG output:100] Intake/Output this shift: No intake/output data recorded.  PE: Gen:  Alert, NAD Card:  Regular rate and rhythm, pitting upper and lower extremity edema  Pulm:  rate and effort normal Abd: Soft, moderate upper abdominal distention, nontender, RLQ urostomy viable with clear yellow urine, midline incision c/d/I, anasarca GU: scrotal and penile edema  Skin: warm and dry, no rashes or lesions Psych: A&Ox3  Lab Results:  Recent Labs    11/14/21 0313 11/15/21 0527  WBC 16.2* 16.1*  HGB 8.9* 8.3*  HCT 28.2* 26.0*  PLT 263 283   BMET Recent Labs    11/14/21 0313 11/15/21 0527  NA 141 140  K 3.3* 2.9*  CL 106 106  CO2 27 27  GLUCOSE 119* 116*  BUN 22 18  CREATININE 0.84 0.82  CALCIUM 7.5* 7.4*   PT/INR No results for input(s): LABPROT, INR in the last 72 hours. CMP     Component Value Date/Time   NA 140 11/15/2021 0527   NA 137 09/21/2020 0942   K 2.9 (L) 11/15/2021 0527   CL 106 11/15/2021 0527   CO2 27 11/15/2021 0527   GLUCOSE 116 (H) 11/15/2021 0527   BUN 18 11/15/2021 0527   BUN 5 (L) 09/21/2020 0942   CREATININE 0.82  11/15/2021 0527   CREATININE 0.72 06/09/2021 1232   CREATININE 0.90 02/07/2017 1032   CALCIUM 7.4 (L) 11/15/2021 0527   PROT 5.4 (L) 11/23/2021 1016   PROT 7.2 03/10/2021 0845   ALBUMIN 2.1 (L) 11/06/2021 1016   ALBUMIN 4.4 03/10/2021 0845   AST 49 (H) 11/19/2021 1016   AST 22 06/09/2021 1232   ALT 21 11/11/2021 1016   ALT 10 06/09/2021 1232   ALKPHOS 57 11/03/2021 1016   BILITOT 1.0 11/11/2021 1016   BILITOT 0.4 06/09/2021 1232   GFRNONAA >60 11/15/2021 0527   GFRNONAA >60 06/09/2021 1232   GFRNONAA 84 02/07/2017 1032   GFRAA >60 09/23/2020 1133   GFRAA >89 02/07/2017 1032   Lipase  No results found for: LIPASE     Studies/Results: VAS [place] LOWER EXTREMITY VENOUS (DVT)  Result Date: 11/14/2021  Lower Venous DVT Study Patient Name:  William [name]  Date of Exam:   11/14/2021 Medical Rec #: [id number]       Accession #:    [id number] Date of Birth: 04/03/1941       Patient Gender: M Patient Age:   76 years Exam Location:  [facility] Procedure:      VAS [place] LOWER EXTREMITY VENOUS (DVT) Referring Phys: Margie Billet --------------------------------------------------------------------------------  Indications: Swelling.  Risk Factors: None identified. Limitations: Poor ultrasound/tissue interface. Comparison Study: No prior  studies. Performing Technologist: Oliver Hum RVT  Examination Guidelines: A complete evaluation includes B-mode imaging, spectral Doppler, color Doppler, and power Doppler as needed of all accessible portions of each vessel. Bilateral testing is considered an integral part of a complete examination. Limited examinations for reoccurring indications may be performed as noted. The reflux portion of the exam is performed with the patient in reverse Trendelenburg.  +---------+---------------+---------+-----------+----------+--------------+ RIGHT    CompressibilityPhasicitySpontaneityPropertiesThrombus Aging  +---------+---------------+---------+-----------+----------+--------------+ CFV      Full           Yes      Yes                                 +---------+---------------+---------+-----------+----------+--------------+ SFJ      Full                                                        +---------+---------------+---------+-----------+----------+--------------+ FV Prox  Full                                                        +---------+---------------+---------+-----------+----------+--------------+ FV Mid                  Yes      Yes                                 +---------+---------------+---------+-----------+----------+--------------+ FV Distal               Yes      Yes                                 +---------+---------------+---------+-----------+----------+--------------+ PFV      Full                                                        +---------+---------------+---------+-----------+----------+--------------+ POP      Full           Yes      Yes                                 +---------+---------------+---------+-----------+----------+--------------+ PTV      Full                                                        +---------+---------------+---------+-----------+----------+--------------+ PERO     Full                                                        +---------+---------------+---------+-----------+----------+--------------+   +---------+---------------+---------+-----------+----------+-------------------+  LEFT     CompressibilityPhasicitySpontaneityPropertiesThrombus Aging      +---------+---------------+---------+-----------+----------+-------------------+ CFV      Full           Yes      Yes                                      +---------+---------------+---------+-----------+----------+-------------------+ SFJ      Full                                                              +---------+---------------+---------+-----------+----------+-------------------+ FV Prox  Full                                                             +---------+---------------+---------+-----------+----------+-------------------+ FV Mid   Full                                                             +---------+---------------+---------+-----------+----------+-------------------+ FV Distal               Yes      Yes                                      +---------+---------------+---------+-----------+----------+-------------------+ PFV      Full                                                             +---------+---------------+---------+-----------+----------+-------------------+ POP      Full           Yes      Yes                                      +---------+---------------+---------+-----------+----------+-------------------+ PTV      Full                                                             +---------+---------------+---------+-----------+----------+-------------------+ PERO                                                  Not well visualized +---------+---------------+---------+-----------+----------+-------------------+     Summary: RIGHT: - There is no evidence of  deep vein thrombosis in the lower extremity. However, portions of this examination were limited- see technologist comments above.  - No cystic structure found in the popliteal fossa.  LEFT: - There is no evidence of deep vein thrombosis in the lower extremity. However, portions of this examination were limited- see technologist comments above.  - No cystic structure found in the popliteal fossa.  *See table(s) above for measurements and observations. Electronically signed by Deitra Mayo MD on 11/14/2021 at 1:55:31 PM.    Final    VAS [place] UPPER EXTREMITY VENOUS DUPLEX  Result Date: 11/14/2021 UPPER VENOUS STUDY  Patient Name:  ABDELRAHMAN NAIR  Date of Exam:   11/14/2021  Medical Rec #: [id number]       Accession #:    [id number] Date of Birth: [birth year]       Patient Gender: M Patient Age:   38 years Exam Location:  [facility] Procedure:      VAS [place] UPPER EXTREMITY VENOUS DUPLEX Referring Phys: Margie Billet --------------------------------------------------------------------------------  Indications: Edema Risk Factors: None identified. Limitations: Poor ultrasound/tissue interface and line. Comparison Study: No prior studies. Performing Technologist: Oliver Hum RVT  Examination Guidelines: A complete evaluation includes B-mode imaging, spectral Doppler, color Doppler, and power Doppler as needed of all accessible portions of each vessel. Bilateral testing is considered an integral part of a complete examination. Limited examinations for reoccurring indications may be performed as noted.  Right Findings: +----------+------------+---------+-----------+----------+-------+ RIGHT     CompressiblePhasicitySpontaneousPropertiesSummary +----------+------------+---------+-----------+----------+-------+ IJV           Full       Yes       Yes                      +----------+------------+---------+-----------+----------+-------+ Subclavian    Full       Yes       Yes                      +----------+------------+---------+-----------+----------+-------+ Axillary      Full       Yes       Yes                      +----------+------------+---------+-----------+----------+-------+ Brachial      Full       Yes       Yes                      +----------+------------+---------+-----------+----------+-------+ Radial        Full                                          +----------+------------+---------+-----------+----------+-------+ Ulnar         Full                                          +----------+------------+---------+-----------+----------+-------+ Cephalic      Full                                           +----------+------------+---------+-----------+----------+-------+ Basilic       Full                                          +----------+------------+---------+-----------+----------+-------+  Left Findings: +----------+------------+---------+-----------+----------+-------+ LEFT      CompressiblePhasicitySpontaneousPropertiesSummary +----------+------------+---------+-----------+----------+-------+ IJV           Full       Yes       Yes                      +----------+------------+---------+-----------+----------+-------+ Subclavian    Full       Yes       Yes                      +----------+------------+---------+-----------+----------+-------+ Axillary      Full       Yes       Yes                      +----------+------------+---------+-----------+----------+-------+ Brachial      Full       Yes       Yes                      +----------+------------+---------+-----------+----------+-------+ Radial        Full                                          +----------+------------+---------+-----------+----------+-------+ Ulnar         Full                                          +----------+------------+---------+-----------+----------+-------+ Cephalic      Full                                          +----------+------------+---------+-----------+----------+-------+ Basilic       Full                                          +----------+------------+---------+-----------+----------+-------+  Summary:  Right: No evidence of deep vein thrombosis in the upper extremity. No evidence of superficial vein thrombosis in the upper extremity.  Left: No evidence of deep vein thrombosis in the upper extremity. No evidence of superficial vein thrombosis in the upper extremity.  *See table(s) above for measurements and observations.  Diagnosing physician: Deitra Mayo MD Electronically signed by Deitra Mayo MD on 11/14/2021 at 1:55:47 PM.     Final     Anti-infectives: Anti-infectives (From admission, onward)    Start     Dose/Rate Route Frequency Ordered Stop   11/12/21 0800  Ampicillin-Sulbactam (UNASYN) 3 g in sodium chloride 0.9 % 100 mL IVPB        3 g 200 mL/hr over 30 Minutes Intravenous Every 6 hours 11/12/21 0712 11/18/21 2359   11/10/21 1400  cefTRIAXone (ROCEPHIN) 2 g in sodium chloride 0.9 % 100 mL IVPB  Status:  Discontinued        2 g 200 mL/hr over 30 Minutes Intravenous Daily 11/10/21 1048 11/12/21 0712   11/10/21 0800  ceFEPIme (MAXIPIME) 2 g in sodium chloride 0.9 % 100 mL IVPB  Status:  Discontinued        2 g 200 mL/hr over 30  Minutes Intravenous Every 12 hours 11/10/21 0618 11/10/21 1048   11/10/21 0630  ceFEPIme (MAXIPIME) 2 g in sodium chloride 0.9 % 100 mL IVPB  Status:  Discontinued        2 g 200 mL/hr over 30 Minutes Intravenous Every 24 hours 11/05/2021 1622 11/10/21 0618   11/06/2021 1624  vancomycin variable dose per unstable renal function (pharmacist dosing)  Status:  Discontinued         Does not apply See admin instructions 11/22/2021 1624 11/10/21 0613   11/11/2021 1030  ceFEPIme (MAXIPIME) 2 g in sodium chloride 0.9 % 100 mL IVPB        2 g 200 mL/hr over 30 Minutes Intravenous  Once 11/10/2021 1016 11/19/2021 1133   11/07/2021 1030  metroNIDAZOLE (FLAGYL) IVPB 500 mg        500 mg 100 mL/hr over 60 Minutes Intravenous  Once 11/14/2021 1016 11/17/2021 1915   11/26/2021 1030  vancomycin (VANCOCIN) IVPB 1000 mg/200 mL premix  Status:  Discontinued        1,000 mg 200 mL/hr over 60 Minutes Intravenous  Once 11/26/2021 1016 11/08/2021 1023   11/20/2021 1030  vancomycin (VANCOREADY) IVPB 1500 mg/300 mL        1,500 mg 150 mL/hr over 120 Minutes Intravenous  Once 11/28/2021 1023 11/15/2021 1341        Assessment/Plan pSBO  vs ileus - s/p Robotic-assisted laparoscopic radical cystectomy, radical prostatectomy, Bilateral pelvic lymph node dissection, Extensive laparoscopic adhesiolysis, Small bowel resection, Ileal  conduit urinary diversion 10/26/21 [name] - CT scan with SBO pattern, possibly a pSBO at the level of the surgical anastomosis  -  seemed to be clinically improving, NG removed 11/14 PM, started on CLD, developed emesis 11/15. He has persistent belching, hiccups, and abd distention. Also has ongoing flatus and liquid stools. Consistent with pSBO/ileus picture. KUB ordered and pending. Continue sips of clears as tolerated. Significant gastric distention on X-ray or recurrent nausea and vomiting may warrant replacement of NG tube.    BUE/BLE edema - u/s ordered to look for DVT - notified primary team  Small volume pneumoperitoneum - reviewed with Dr. Johney Maine and [name] 11/10, suspect this is related to insufflation/post-operative state.   ID - maxipime/ flagyl/ vancomycin 11/10, switched to Rocephin then Unasyn based on urine cx per primary team VTE - SCDs, per primary FEN - FLD, Boost Foley - none. Urostomy/Ileal conduit    Septic shock - resolved Klebsiella Oxytoca UTI  Asthma/ COPD Chronic back pain HLD AKI Possible UTI   LOS: 6 days    Jill Alexanders, [facility] Surgery 11/15/2021, 9:23 AM Please see Amion for pager number during day hours 7:00am-4:30pm

## 2021-11-15 NOTE — Progress Notes (Signed)
PROGRESS NOTE    William [name]  IWO:[id number] DOB: 11/07/1941 DOA: 11/22/2021 PCP: [name], MD    Brief Narrative:  William [name] is a 80 year old male with past medical history significant for bladder cancer s/p robotic assisted laparoscopic radical cystectomy/prostatectomy, bilateral pelvic lymph node dissection, extensive laparoscopic adhesiolysis, small bowel resection with ileal conduit urinary diversion 10/26/21 by [name], COPD, history of tobacco use disorder who presented to [facility] ED on 11/10 from urology clinic with finding of low blood pressure.  Following recent discharge, patient has been dealing with constipation with poor oral intake with darker urine.  Also complaining of increasing left lower quadrant pain and intermittent fevers at home.  Reports that his ileal conduit has been functioning normally.  Pain worsened to the point where his pain medications were not effective.  EMS was called on 11/9 but was not transported and was instructed to add naproxen to his pain regimen which seemed to give some relief.  Patient was seen in follow-up in the urology clinic on 1111 was found to have blood pressure in the 50I systolic and was referred to the ED for further assessment.  Patient recent hospitalized and underwent robotic assisted laparoscopic radical cystectomy/prostatectomy with bilateral pelvic lymph node dissection, extensive laparoscopic adhesion anxiolysis, small bowel resection and ileal conduit urinary diversion on 10/25/2021 by [name] for his invasive bladder cancer; hospital course was largely uneventful and was discharged on postop day 8 11/02/2021.  In the ED, SBP's in the 60s, treated with IV fluids and started on Levophed for blood pressure support.  Sodium 131, potassium 5.2, chloride 98, CO2 21, glucose 88, BUN 43, creatinine 2.87, anion gap 12, lactic acid 4.6, WBC 7, hemoglobin 9.2, platelets 364 with left shift.  Urinalysis from ileal conduit with  100 protein, large leukocytes, greater than 50 WBCs and many bacteria.  CT chest/abdomen/pelvis were notable for findings concerning for underlying ILD, coronary artery calcification, concern for small bowel obstruction/possible transition point in the lower abdomen/pelvis near surgical anastomosis with small/moderate volume ascites in the pelvis/left abdomen, small amount of free intraperitoneal air (?post-op), small to moderate amount of subcutaneous emphysema without accompanying fluid collection/stranding, cystoprostatectomy changes with right-sided urinary ostomy, bilateral ureteral stents extending through the ostomy with no hydronephrosis.  Patient was admitted to the PCCM service due to septic shock from likely urinary source.  Care transferred from [facility] to hospital service on 11/11/2021.  11/15/2021: Patient was seen and examined at his bedside.  He is somnolent but is arousable to voices.  Reports abdominal pain.  Abdominal x-ray done this morning showed partial small bowel obstruction/ileus.  Added simethicone 4 times daily and Dulcolax suppository.  Later patient had a large bowel movement.   Assessment & Plan:   Principal Problem:   Septic shock ([place]) Active Problems:   Chronic pain   Bladder cancer ([place])   Bacteremia due to Gram-negative bacteria   UTI due to Klebsiella species   Septic shock, POA Klebsiella oxytoca UTI/septicemia, POA Patient presenting to ED from urology clinic after being found with systolic blood pressures in the 50s.  Patient was given IV fluid resuscitation without improvement and eventually placed on Levophed for vasopressor support.  Patient met septic shock criteria on admission with hypotension not improved with IV fluid resuscitation requiring Levophed in the setting of endorgan damage with acute renal failure, lactic acidosis with source of infection urinary tract with septicemia.  Patient was started on empiric antibiotics initially with vancomycin,  cefepime, metronidazole and  was de-escalated to ceftriaxone based on cultures. --Now titrated off of vasopressors --WBC 7.0>12.5>18.6>21.5>19.3>16.2 --PCT 41.64>32.48>18.72>4.67>2.62 --Blood cultures x2: Klebsiella oxytoca --Urine culture >100K Klebsiella oxytoca/Enterococcus faecalis --Unasyn 3g IV q6h, plan 7-day course --CBC daily  Small bowel obstruction, improving. CT abdomen/pelvis on admission with concern for small bowel obstruction with possible transition point in the lower abdomen/pelvis near surgical anastomosis site.  General surgery believes this is likely secondary to inflammatory process from recent abdominal surgery and not fibrous/adhesion in nature; and recommend conservative management with NG tube and bowel rest. --General surgery/urology following, appreciate assistance --NGT removed today --Diet advanced to full liquids --Further per general surgery/urology Abdominal x-ray personally reviewed on 11/15/2021 showed partial small bowel obstruction/ileus Started simethicone 4 times daily x3 days and Dulcolax 10 mg rectally x3 days. Patient had a large bowel movement on 11/15/2021. Optimize magnesium and potassium levels Goal potassium greater than 4.0 Goal magnesium greater than 2.0. Mobilize as tolerated  Acute renal failure: resolved Etiology likely secondary to prolonged prerenal state from dehydration and poor oral intake versus ATN from hypotension/septic shock as above. --Cr 2.87>3.00>1.67>1.21>1.04>0.90>0.86 --Discontinue IV fluids today with advancement of diet to full liquids --Avoid nephrotoxins, renal dose all medications --BMP daily  Hypokalemia Serum potassium 2.9, goal potassium greater than 4.0 in the setting of bowel obstruction. Magnesium level at goal 2.0.  Invasive muscle bladder cancer s/p radical cystectomy, prostatectomy and ileal conduit Underwent robotic assisted laparoscopic radical cystectomy, robotic radical prostatectomy, bilateral  pelvic lymph node dissection, extensive laparoscopic adhesiolysis, small bowel resection and ileal conduit urinary diversion on 10/25/2021 by [name] of urology. --Urology following, appreciate assistance --Continue ostomy care; monitor urinary output  COPD History of tobacco abuse --Incruse Ellipta 1 puff daily --DuoNebs as needed --Currently on room air  Normocytic anemia  Chemotherapy 8 months ago 9.2>9.5>9.2>8.8>9.4>9.2>8.9> 8.3 --Transfuse for hemoglobin less than 7.0 or active bleeding --CBC daily Hemoglobin is downtrending Will obtain iron studies, follow results, IV Feraheme if low.  Suspected ILD Noted on CT imaging this admission.  History of tobacco abuse. --Flonase --Outpatient pulmonology follow-up  Chronic back pain At baseline on gabapentin, oxycodone, Zanaflex. --Holding home oral medicines due to small bowel obstruction --Morphine 2-38m IV q4h PRN  Weakness/deconditioning/debility: --Needs increased mobility, OOB to chair BID --PT/OT recommending home health on discharge. -Continue to mobilize as tolerated.  Moderate protein calorie malnutrition Albumin 2.1 Moderate muscle mass loss Encourage oral intake as tolerated Oral supplement when can tolerate a diet.   DVT prophylaxis: heparin injection 5,000 Units Start: 11/24/2021 1515 SCDs Start: 11/08/2021 1502   Code Status: Full Code Family Communication: Updated family present at bedside this morning  Disposition Plan:  Level of care: Med-Surg Status is: Inpatient  Remains inpatient appropriate because: Continues on IV antibiotics, NG tube removed today, general surgery advancing diet, needs to demonstrate toleration of diet before able discharge home.   Consultants:  PCCM - signed off 11/12 General surgery Urology  Procedures:  Central line placement 11/10  Antimicrobials:  Ceftriaxone 11/11 - 11/13 Vancomycin 11/10 - 11/11 Cefepime 11/10 - 11/11 Metronidazole 11/10 - 11/10 Unasyn  11/13>>     Objective: Vitals:   11/15/21 0301 11/15/21 0500 11/15/21 0658 11/15/21 1358  BP: 125/67  140/75 134/78  Pulse: (!) 109  (!) 109 (!) 109  Resp: _0 Temp: 98.3 F (36.8 C)  99.5 F (37.5 C) 99.1 F (37.3 C)  TempSrc: Oral  Oral Oral  SpO2: 93%  93% 94%  Weight:  87.4 kg  Height:        Intake/Output Summary (Last 24 hours) at 11/15/2021 1742 Last data filed at 11/15/2021 1434 Gross per 24 hour  Intake 862.05 ml  Output 1400 ml  Net -537.95 ml   Filed Weights   11/12/21 0500 11/13/21 0603 11/15/21 0500  Weight: 80 kg 83.7 kg 87.4 kg    Examination:  General exam: Frail-appearing in no acute distress.  He is somnolent but easily arousable to voices.   Respiratory system: Clear to auscultation no wheezes or rales.  Good respiratory effort.   Cardiovascular system: Regular rate and rhythm no rubs or gallops.  No JVD or thyromegaly noted.   Gastrointestinal system: Abdomen mildly distended.  Bowel sounds noted.  Mild tenderness around umbilicus area.   Central nervous system: Somnolent but easily arousable to voices.  No focal neurological deficits.   Extremities: Trace lower extremity edema bilaterally. Skin: No rashes, or ulcerative lesions. Psychiatry: Unable to assess mood due to somnolence.   Data Reviewed: I have personally reviewed following labs and imaging studies  CBC: Recent Labs  Lab 11/24/2021 1016 11/18/2021 1029 11/11/21 0542 11/12/21 0319 11/13/21 0319 11/14/21 0313 11/15/21 0527  WBC 7.0   < > 18.6* 21.5* 19.3* 16.2* 16.1*  NEUTROABS 6.2  --   --   --   --   --   --   HGB 9.2*   < > 8.8* 9.4* 9.2* 8.9* 8.3*  HCT 28.2*   < > 27.5* 29.2* 28.8* 28.2* 26.0*  MCV 91.6   < > 92.9 90.7 92.0 92.2 90.9  PLT 364   < > 281 256 266 263 283   < > = values in this interval not displayed.   Basic Metabolic Panel: Recent Labs  Lab 11/10/21 0500 11/11/21 0542 11/12/21 0319 11/13/21 0319 11/14/21 0313 11/15/21 0527 11/15/21 1508  NA  134*   < > 142 145 141 140 137  K 4.6   < > 3.8 3.5 3.3* 2.9* 3.5  CL 100   < > 105 108 106 106 103  CO2 23   < > _0 GLUCOSE 87   < > 105* 119* 119* 116* 108*  BUN 35*   < > 26* _1 CREATININE 1.67*   < > 1.04 0.90 0.84 0.82 0.83  CALCIUM 7.4*   < > 7.9* 7.7* 7.5* 7.4* 7.4*  MG 2.0  --  2.4  --  2.1  --  2.0  PHOS 3.7  --  3.0  --   --   --   --    < > = values in this interval not displayed.   GFR: Estimated Creatinine Clearance: 76.1 mL/min (by C-G formula based on SCr of 0.83 mg/dL). Liver Function Tests: Recent Labs  Lab 11/10/2021 1016  AST 49*  ALT 21  ALKPHOS 57  BILITOT 1.0  PROT 5.4*  ALBUMIN 2.1*   No results for input(s): LIPASE, AMYLASE in the last 168 hours. No results for input(s): AMMONIA in the last 168 hours. Coagulation Profile: Recent Labs  Lab 10/31/2021 1016 11/10/21 0500  INR 1.4* 1.5*   Cardiac Enzymes: No results for input(s): CKTOTAL, CKMB, CKMBINDEX, TROPONINI in the last 168 hours. BNP (last 3 results) No results for input(s): PROBNP in the last 8760 hours. HbA1C: No results for input(s): HGBA1C in the last 72 hours. CBG: Recent Labs  Lab 11/11/21 1236 11/11/21 1603 11/11/21 1954 11/11/21 2348 11/12/21 0358  GLUCAP 86 87 109*  109* 100*   Lipid Profile: No results for input(s): CHOL, HDL, LDLCALC, TRIG, CHOLHDL, LDLDIRECT in the last 72 hours. Thyroid Function Tests: No results for input(s): TSH, T4TOTAL, FREET4, T3FREE, THYROIDAB in the last 72 hours. Anemia Panel: No results for input(s): VITAMINB12, FOLATE, FERRITIN, TIBC, IRON, RETICCTPCT in the last 72 hours. Sepsis Labs: Recent Labs  Lab 11/19/2021 1016 11/10/2021 1216 11/14/2021 1749 11/08/2021 1749 11/10/21 0500 11/11/21 0542 11/13/21 0319 11/14/21 0313  PROCALCITON  --   --  41.64   < > 32.48 18.72 4.67 2.62  LATICACIDVEN 4.6* 4.5* 4.8*  --   --   --   --   --    < > = values in this interval not displayed.    Recent Results (from the past 240  hour(s))  Blood Culture (routine x 2)     Status: Abnormal   Collection Time: 11/08/2021 10:16 AM   Specimen: BLOOD RIGHT ARM  Result Value Ref Range Status   Specimen Description BLOOD RIGHT ARM  Final   Special Requests   Final    BOTTLES DRAWN AEROBIC AND ANAEROBIC Blood Culture adequate volume   Culture  Setup Time   Final    GRAM NEGATIVE RODS IN BOTH AEROBIC AND ANAEROBIC BOTTLES CRITICAL RESULT CALLED TO, READ BACK BY AND VERIFIED WITH: M LILLISTON,[email or web address]  11/10/21 [place] Performed at [facility], [place] [address], [place], [place] [address]    Culture KLEBSIELLA OXYTOCA (A)  Final   Report Status 11/12/2021 FINAL  Final   Organism ID, Bacteria KLEBSIELLA OXYTOCA  Final      Susceptibility   Klebsiella oxytoca - MIC*    AMPICILLIN >=32 RESISTANT Resistant     CEFAZOLIN 8 SENSITIVE Sensitive     CEFEPIME <=0.12 SENSITIVE Sensitive     CEFTAZIDIME <=1 SENSITIVE Sensitive     CEFTRIAXONE <=0.25 SENSITIVE Sensitive     CIPROFLOXACIN <=0.25 SENSITIVE Sensitive     GENTAMICIN <=1 SENSITIVE Sensitive     IMIPENEM <=0.25 SENSITIVE Sensitive     TRIMETH/SULFA <=20 SENSITIVE Sensitive     AMPICILLIN/SULBACTAM 8 SENSITIVE Sensitive     PIP/TAZO <=4 SENSITIVE Sensitive     * KLEBSIELLA OXYTOCA  Urine Culture     Status: Abnormal   Collection Time: 11/26/2021 10:16 AM   Specimen: In/Out Cath Urine  Result Value Ref Range Status   Specimen Description   Final    IN/OUT CATH URINE Performed at [facility] [address] [place], [place] [address]    Special Requests   Final    NONE Performed at [facility], [place] [address] [place], [place] [address]    Culture (A)  Final    >=100,000 COLONIES/mL KLEBSIELLA OXYTOCA >=100,000 COLONIES/mL ENTEROCOCCUS FAECALIS    Report Status 11/12/2021 FINAL  Final   Organism ID, Bacteria KLEBSIELLA OXYTOCA (A)  Final   Organism ID, Bacteria ENTEROCOCCUS FAECALIS (A)  Final      Susceptibility    Enterococcus faecalis - MIC*    AMPICILLIN <=2 SENSITIVE Sensitive     NITROFURANTOIN <=16 SENSITIVE Sensitive     VANCOMYCIN 1 SENSITIVE Sensitive     * >=100,000 COLONIES/mL ENTEROCOCCUS FAECALIS   Klebsiella oxytoca - MIC*    AMPICILLIN >=32 RESISTANT Resistant     CEFAZOLIN 8 SENSITIVE Sensitive     CEFEPIME <=0.12 SENSITIVE Sensitive     CEFTRIAXONE <=0.25 SENSITIVE Sensitive     CIPROFLOXACIN <=0.25 SENSITIVE Sensitive     GENTAMICIN <=1 SENSITIVE Sensitive  IMIPENEM <=0.25 SENSITIVE Sensitive     NITROFURANTOIN 32 SENSITIVE Sensitive     TRIMETH/SULFA <=20 SENSITIVE Sensitive     AMPICILLIN/SULBACTAM 8 SENSITIVE Sensitive     PIP/TAZO <=4 SENSITIVE Sensitive     * >=100,000 COLONIES/mL KLEBSIELLA OXYTOCA  Blood Culture ID Panel (Reflexed)     Status: Abnormal   Collection Time: 11/17/2021 10:16 AM  Result Value Ref Range Status   Enterococcus faecalis NOT DETECTED NOT DETECTED Final   Enterococcus Faecium NOT DETECTED NOT DETECTED Final   Listeria monocytogenes NOT DETECTED NOT DETECTED Final   Staphylococcus species NOT DETECTED NOT DETECTED Final   Staphylococcus aureus (BCID) NOT DETECTED NOT DETECTED Final   Staphylococcus epidermidis NOT DETECTED NOT DETECTED Final   Staphylococcus lugdunensis NOT DETECTED NOT DETECTED Final   Streptococcus species NOT DETECTED NOT DETECTED Final   Streptococcus agalactiae NOT DETECTED NOT DETECTED Final   Streptococcus pneumoniae NOT DETECTED NOT DETECTED Final   Streptococcus pyogenes NOT DETECTED NOT DETECTED Final   A.calcoaceticus-baumannii NOT DETECTED NOT DETECTED Final   Bacteroides fragilis NOT DETECTED NOT DETECTED Final   Enterobacterales DETECTED (A) NOT DETECTED Final    Comment: Enterobacterales represent a large order of gram negative bacteria, not a single organism. CRITICAL RESULT CALLED TO, READ BACK BY AND VERIFIED WITH: M LILLISTON,[email or web address]  11/10/21 [place]    Enterobacter cloacae complex NOT DETECTED NOT DETECTED  Final   Escherichia coli NOT DETECTED NOT DETECTED Final   Klebsiella aerogenes NOT DETECTED NOT DETECTED Final   Klebsiella oxytoca DETECTED (A) NOT DETECTED Final    Comment: CRITICAL RESULT CALLED TO, READ BACK BY AND VERIFIED WITH: M LILLISTON,[email or web address]  11/10/21 [place]    Klebsiella pneumoniae NOT DETECTED NOT DETECTED Final   Proteus species NOT DETECTED NOT DETECTED Final   Salmonella species NOT DETECTED NOT DETECTED Final   Serratia marcescens NOT DETECTED NOT DETECTED Final   Haemophilus influenzae NOT DETECTED NOT DETECTED Final   Neisseria meningitidis NOT DETECTED NOT DETECTED Final   Pseudomonas aeruginosa NOT DETECTED NOT DETECTED Final   Stenotrophomonas maltophilia NOT DETECTED NOT DETECTED Final   Candida albicans NOT DETECTED NOT DETECTED Final   Candida auris NOT DETECTED NOT DETECTED Final   Candida glabrata NOT DETECTED NOT DETECTED Final   Candida krusei NOT DETECTED NOT DETECTED Final   Candida parapsilosis NOT DETECTED NOT DETECTED Final   Candida tropicalis NOT DETECTED NOT DETECTED Final   Cryptococcus neoformans/gattii NOT DETECTED NOT DETECTED Final   CTX-M ESBL NOT DETECTED NOT DETECTED Final   Carbapenem resistance IMP NOT DETECTED NOT DETECTED Final   Carbapenem resistance KPC NOT DETECTED NOT DETECTED Final   Carbapenem resistance NDM NOT DETECTED NOT DETECTED Final   Carbapenem resist OXA 48 LIKE NOT DETECTED NOT DETECTED Final   Carbapenem resistance VIM NOT DETECTED NOT DETECTED Final    Comment: Performed at [facility], [address], [place], [place] [address]  Blood Culture (routine x 2)     Status: Abnormal   Collection Time: 11/20/2021 10:21 AM   Specimen: BLOOD RIGHT WRIST  Result Value Ref Range Status   Specimen Description   Final    BLOOD RIGHT WRIST Performed at [facility], [address], [place], [place] [address]    Special Requests   Final    BOTTLES DRAWN AEROBIC ONLY Blood Culture adequate volume Performed at  [facility] [address], [place], [place] [address]    Culture  Setup Time   Final  GRAM NEGATIVE RODS AEROBIC BOTTLE ONLY CRITICAL VALUE NOTED.  VALUE IS CONSISTENT WITH PREVIOUSLY REPORTED AND CALLED VALUE.    Culture (A)  Final    KLEBSIELLA OXYTOCA SUSCEPTIBILITIES PERFORMED ON PREVIOUS CULTURE WITHIN THE LAST 5 DAYS. Performed at [facility], [place] [address], [place], [place] [address]    Report Status 11/12/2021 FINAL  Final  Resp Panel by RT-PCR (Flu A&B, Covid) Nasopharyngeal Swab     Status: None   Collection Time: 11/14/2021 11:18 AM   Specimen: Nasopharyngeal Swab; Nasopharyngeal(NP) swabs in vial transport medium  Result Value Ref Range Status   SARS Coronavirus 2 by RT PCR NEGATIVE NEGATIVE Final    Comment: (NOTE) SARS-CoV-2 target nucleic acids are NOT DETECTED.  The SARS-CoV-2 RNA is generally detectable in upper respiratory specimens during the acute phase of infection. The lowest concentration of SARS-CoV-2 viral copies this assay can detect is 138 copies/mL. A negative result does not preclude SARS-Cov-2 infection and should not be used as the sole basis for treatment or other patient management decisions. A negative result may occur with  improper specimen collection/handling, submission of specimen other than nasopharyngeal swab, presence of viral mutation(s) within the areas targeted by this assay, and inadequate number of viral copies(<138 copies/mL). A negative result must be combined with clinical observations, patient history, and epidemiological information. The expected result is Negative.  Fact Sheet for Patients:  EntrepreneurPulse.com.au  Fact Sheet for Healthcare Providers:  IncredibleEmployment.be  This test is no t yet approved or cleared by the [place] FDA and  has been authorized for detection and/or diagnosis of SARS-CoV-2 by FDA under an Emergency Use Authorization  (EUA). This EUA will remain  in effect (meaning this test can be used) for the duration of the COVID-19 declaration under Section 564(b)(1) of the Act, 21 U.S.C.section 360bbb-3(b)(1), unless the authorization is terminated  or revoked sooner.       Influenza A by PCR NEGATIVE NEGATIVE Final   Influenza B by PCR NEGATIVE NEGATIVE Final    Comment: (NOTE) The Xpert Xpress SARS-CoV-2/FLU/RSV plus assay is intended as an aid in the diagnosis of influenza from Nasopharyngeal swab specimens and should not be used as a sole basis for treatment. Nasal washings and aspirates are unacceptable for Xpert Xpress SARS-CoV-2/FLU/RSV testing.  Fact Sheet for Patients: EntrepreneurPulse.com.au  Fact Sheet for Healthcare Providers: IncredibleEmployment.be  This test is not yet approved or cleared by the [place] FDA and has been authorized for detection and/or diagnosis of SARS-CoV-2 by FDA under an Emergency Use Authorization (EUA). This EUA will remain in effect (meaning this test can be used) for the duration of the COVID-19 declaration under Section 564(b)(1) of the Act, 21 U.S.C. section 360bbb-3(b)(1), unless the authorization is terminated or revoked.  Performed at [facility], [place] [address], [place], [place] [address]          Radiology Studies: DG Abd Portable 1V  Result Date: 11/15/2021 CLINICAL DATA:  Abdominal pain, vomiting EXAM: PORTABLE ABDOMEN - 1 VIEW COMPARISON:  11/12/2021 FINDINGS: There is interval removal of enteric tube. There is dilation of small-bowel loops measuring up to 5.8 cm in maximum diameter. There is moderate gaseous distention of colon. Stomach is not distended. Ureteral stents are noted on both sides apparently placed through neobladder reconstruction in the right lower abdomen. Laminectomy and fusion is seen in the lumbar spine. IMPRESSION: There is abnormal dilation of small-bowel loops with  interval worsening suggesting ileus or partial obstruction. Electronically Signed  By: Elmer Picker M.D.   On: 11/15/2021 10:28   VAS [place] LOWER EXTREMITY VENOUS (DVT)  Result Date: 11/14/2021  Lower Venous DVT Study Patient Name:  William [name]  Date of Exam:   11/14/2021 Medical Rec #: [id number]       Accession #:    [id number] Date of Birth: 04-May-1941       Patient Gender: M Patient Age:   60 years Exam Location:  [facility] Procedure:      VAS [place] LOWER EXTREMITY VENOUS (DVT) Referring Phys: Margie Billet --------------------------------------------------------------------------------  Indications: Swelling.  Risk Factors: None identified. Limitations: Poor ultrasound/tissue interface. Comparison Study: No prior studies. Performing Technologist: Oliver Hum RVT  Examination Guidelines: A complete evaluation includes B-mode imaging, spectral Doppler, color Doppler, and power Doppler as needed of all accessible portions of each vessel. Bilateral testing is considered an integral part of a complete examination. Limited examinations for reoccurring indications may be performed as noted. The reflux portion of the exam is performed with the patient in reverse Trendelenburg.  +---------+---------------+---------+-----------+----------+--------------+ RIGHT    CompressibilityPhasicitySpontaneityPropertiesThrombus Aging +---------+---------------+---------+-----------+----------+--------------+ CFV      Full           Yes      Yes                                 +---------+---------------+---------+-----------+----------+--------------+ SFJ      Full                                                        +---------+---------------+---------+-----------+----------+--------------+ FV Prox  Full                                                        +---------+---------------+---------+-----------+----------+--------------+ FV Mid                  Yes      Yes                                  +---------+---------------+---------+-----------+----------+--------------+ FV Distal               Yes      Yes                                 +---------+---------------+---------+-----------+----------+--------------+ PFV      Full                                                        +---------+---------------+---------+-----------+----------+--------------+ POP      Full           Yes      Yes                                 +---------+---------------+---------+-----------+----------+--------------+  PTV      Full                                                        +---------+---------------+---------+-----------+----------+--------------+ PERO     Full                                                        +---------+---------------+---------+-----------+----------+--------------+   +---------+---------------+---------+-----------+----------+-------------------+ LEFT     CompressibilityPhasicitySpontaneityPropertiesThrombus Aging      +---------+---------------+---------+-----------+----------+-------------------+ CFV      Full           Yes      Yes                                      +---------+---------------+---------+-----------+----------+-------------------+ SFJ      Full                                                             +---------+---------------+---------+-----------+----------+-------------------+ FV Prox  Full                                                             +---------+---------------+---------+-----------+----------+-------------------+ FV Mid   Full                                                             +---------+---------------+---------+-----------+----------+-------------------+ FV Distal               Yes      Yes                                      +---------+---------------+---------+-----------+----------+-------------------+ PFV      Full                                                              +---------+---------------+---------+-----------+----------+-------------------+ POP      Full           Yes      Yes                                      +---------+---------------+---------+-----------+----------+-------------------+ PTV  Full                                                             +---------+---------------+---------+-----------+----------+-------------------+ PERO                                                  Not well visualized +---------+---------------+---------+-----------+----------+-------------------+     Summary: RIGHT: - There is no evidence of deep vein thrombosis in the lower extremity. However, portions of this examination were limited- see technologist comments above.  - No cystic structure found in the popliteal fossa.  LEFT: - There is no evidence of deep vein thrombosis in the lower extremity. However, portions of this examination were limited- see technologist comments above.  - No cystic structure found in the popliteal fossa.  *See table(s) above for measurements and observations. Electronically signed by Deitra Mayo MD on 11/14/2021 at 1:55:31 PM.    Final    VAS [place] UPPER EXTREMITY VENOUS DUPLEX  Result Date: 11/14/2021 UPPER VENOUS STUDY  Patient Name:  William [name]  Date of Exam:   11/14/2021 Medical Rec #: [id number]       Accession #:    [id number] Date of Birth: 1941/07/24       Patient Gender: M Patient Age:   68 years Exam Location:  [facility] Procedure:      VAS [place] UPPER EXTREMITY VENOUS DUPLEX Referring Phys: Margie Billet --------------------------------------------------------------------------------  Indications: Edema Risk Factors: None identified. Limitations: Poor ultrasound/tissue interface and line. Comparison Study: No prior studies. Performing Technologist: Oliver Hum RVT  Examination Guidelines: A complete evaluation includes B-mode imaging, spectral  Doppler, color Doppler, and power Doppler as needed of all accessible portions of each vessel. Bilateral testing is considered an integral part of a complete examination. Limited examinations for reoccurring indications may be performed as noted.  Right Findings: +----------+------------+---------+-----------+----------+-------+ RIGHT     CompressiblePhasicitySpontaneousPropertiesSummary +----------+------------+---------+-----------+----------+-------+ IJV           Full       Yes       Yes                      +----------+------------+---------+-----------+----------+-------+ Subclavian    Full       Yes       Yes                      +----------+------------+---------+-----------+----------+-------+ Axillary      Full       Yes       Yes                      +----------+------------+---------+-----------+----------+-------+ Brachial      Full       Yes       Yes                      +----------+------------+---------+-----------+----------+-------+ Radial        Full                                          +----------+------------+---------+-----------+----------+-------+  Ulnar         Full                                          +----------+------------+---------+-----------+----------+-------+ Cephalic      Full                                          +----------+------------+---------+-----------+----------+-------+ Basilic       Full                                          +----------+------------+---------+-----------+----------+-------+  Left Findings: +----------+------------+---------+-----------+----------+-------+ LEFT      CompressiblePhasicitySpontaneousPropertiesSummary +----------+------------+---------+-----------+----------+-------+ IJV           Full       Yes       Yes                      +----------+------------+---------+-----------+----------+-------+ Subclavian    Full       Yes       Yes                       +----------+------------+---------+-----------+----------+-------+ Axillary      Full       Yes       Yes                      +----------+------------+---------+-----------+----------+-------+ Brachial      Full       Yes       Yes                      +----------+------------+---------+-----------+----------+-------+ Radial        Full                                          +----------+------------+---------+-----------+----------+-------+ Ulnar         Full                                          +----------+------------+---------+-----------+----------+-------+ Cephalic      Full                                          +----------+------------+---------+-----------+----------+-------+ Basilic       Full                                          +----------+------------+---------+-----------+----------+-------+  Summary:  Right: No evidence of deep vein thrombosis in the upper extremity. No evidence of superficial vein thrombosis in the upper extremity.  Left: No evidence of deep vein thrombosis in the upper extremity. No evidence of superficial vein thrombosis in the upper extremity.  *See table(s) above for measurements and observations.  Diagnosing physician: Deitra Mayo  MD Electronically signed by Deitra Mayo MD on 11/14/2021 at 1:55:47 PM.    Final         Scheduled Meds:  bisacodyl  10 mg Rectal Daily   chlorhexidine  15 mL Mouth Rinse BID   feeding supplement  1 Container Oral TID BM   fluticasone  2 spray Each Nare Daily   heparin  5,000 Units Subcutaneous Q8H   lip balm  1 application Topical BID   mouth rinse  15 mL Mouth Rinse q12n4p   nystatin   Topical BID   simethicone  80 mg Oral QID   sodium chloride flush  10-40 mL Intracatheter Q12H   thiamine injection  100 mg Intravenous Daily   umeclidinium bromide  1 puff Inhalation Daily   Continuous Infusions:  sodium chloride 75 mL/hr at 11/14/21 2328    ampicillin-sulbactam (UNASYN) IV 3 g (11/15/21 1536)   chlorproMAZINE (THORAZINE) IV 12.5 mg (11/15/21 0610)   lactated ringers 100 mL/hr at 11/15/21 1153   ondansetron (ZOFRAN) IV       LOS: 6 days    Time spent: 39 minutes spent on chart review, discussion with nursing staff, consultants, updating family and interview/physical exam; more than 50% of that time was spent in counseling and/or coordination of care.    Kayleen Memos, DO [facility] Available via Epic secure chat 7am-7pm After these hours, please refer to coverage provider listed on [email or web address] 11/15/2021, 5:42 PM

## 2021-11-16 ENCOUNTER — Inpatient Hospital Stay: Payer: Self-pay

## 2021-11-16 ENCOUNTER — Inpatient Hospital Stay (HOSPITAL_COMMUNITY): Payer: Medicare HMO

## 2021-11-16 DIAGNOSIS — R6521 Severe sepsis with septic shock: Secondary | ICD-10-CM | POA: Diagnosis not present

## 2021-11-16 DIAGNOSIS — A419 Sepsis, unspecified organism: Secondary | ICD-10-CM | POA: Diagnosis not present

## 2021-11-16 LAB — CBC WITH DIFFERENTIAL/PLATELET
Abs Immature Granulocytes: 0.87 10*3/uL — ABNORMAL HIGH (ref 0.00–0.07)
Basophils Absolute: 0 10*3/uL (ref 0.0–0.1)
Basophils Relative: 0 %
Eosinophils Absolute: 0.1 10*3/uL (ref 0.0–0.5)
Eosinophils Relative: 0 %
HCT: 26.9 % — ABNORMAL LOW (ref 39.0–52.0)
Hemoglobin: 8.5 g/dL — ABNORMAL LOW (ref 13.0–17.0)
Immature Granulocytes: 4 %
Lymphocytes Relative: 6 %
Lymphs Abs: 1.3 10*3/uL (ref 0.7–4.0)
MCH: 29.4 pg (ref 26.0–34.0)
MCHC: 31.6 g/dL (ref 30.0–36.0)
MCV: 93.1 fL (ref 80.0–100.0)
Monocytes Absolute: 0.7 10*3/uL (ref 0.1–1.0)
Monocytes Relative: 4 %
Neutro Abs: 17.7 10*3/uL — ABNORMAL HIGH (ref 1.7–7.7)
Neutrophils Relative %: 86 %
Platelets: 314 10*3/uL (ref 150–400)
RBC: 2.89 MIL/uL — ABNORMAL LOW (ref 4.22–5.81)
RDW: 14.8 % (ref 11.5–15.5)
WBC: 20.7 10*3/uL — ABNORMAL HIGH (ref 4.0–10.5)
nRBC: 0 % (ref 0.0–0.2)

## 2021-11-16 LAB — BASIC METABOLIC PANEL
Anion gap: 7 (ref 5–15)
BUN: 17 mg/dL (ref 8–23)
CO2: 25 mmol/L (ref 22–32)
Calcium: 7.4 mg/dL — ABNORMAL LOW (ref 8.9–10.3)
Chloride: 106 mmol/L (ref 98–111)
Creatinine, Ser: 1.07 mg/dL (ref 0.61–1.24)
GFR, Estimated: >60 mL/min (ref >60)
Glucose, Bld: 95 mg/dL (ref 70–99)
Potassium: 3.2 mmol/L — ABNORMAL LOW (ref 3.5–5.1)
Sodium: 138 mmol/L (ref 135–145)

## 2021-11-16 LAB — CBC
HCT: 26.9 % — ABNORMAL LOW (ref 39.0–52.0)
Hemoglobin: 8.6 g/dL — ABNORMAL LOW (ref 13.0–17.0)
MCH: 29.5 pg (ref 26.0–34.0)
MCHC: 32 g/dL (ref 30.0–36.0)
MCV: 92.1 fL (ref 80.0–100.0)
Platelets: 323 10*3/uL (ref 150–400)
RBC: 2.92 MIL/uL — ABNORMAL LOW (ref 4.22–5.81)
RDW: 15.1 % (ref 11.5–15.5)
WBC: 19.7 10*3/uL — ABNORMAL HIGH (ref 4.0–10.5)
nRBC: 0 % (ref 0.0–0.2)

## 2021-11-16 LAB — MAGNESIUM: Magnesium: 1.9 mg/dL (ref 1.7–2.4)

## 2021-11-16 LAB — COMPREHENSIVE METABOLIC PANEL WITH GFR
ALT: 16 U/L (ref 0–44)
AST: 25 U/L (ref 15–41)
Albumin: 1.7 g/dL — ABNORMAL LOW (ref 3.5–5.0)
Alkaline Phosphatase: 86 U/L (ref 38–126)
Anion gap: 7 (ref 5–15)
BUN: 19 mg/dL (ref 8–23)
CO2: 25 mmol/L (ref 22–32)
Calcium: 7.2 mg/dL — ABNORMAL LOW (ref 8.9–10.3)
Chloride: 104 mmol/L (ref 98–111)
Creatinine, Ser: 1 mg/dL (ref 0.61–1.24)
GFR, Estimated: >60 mL/min (ref >60)
Glucose, Bld: 93 mg/dL (ref 70–99)
Potassium: 3.1 mmol/L — ABNORMAL LOW (ref 3.5–5.1)
Sodium: 136 mmol/L (ref 135–145)
Total Bilirubin: 0.9 mg/dL (ref 0.3–1.2)
Total Protein: 5.2 g/dL — ABNORMAL LOW (ref 6.5–8.1)

## 2021-11-16 LAB — GLUCOSE, CAPILLARY: Glucose-Capillary: 146 mg/dL — ABNORMAL HIGH (ref 70–99)

## 2021-11-16 LAB — SAVE SMEAR(SSMR), FOR PROVIDER SLIDE REVIEW

## 2021-11-16 LAB — LACTIC ACID, PLASMA: Lactic Acid, Venous: 1 mmol/L (ref 0.5–1.9)

## 2021-11-16 LAB — PHOSPHORUS: Phosphorus: 3.3 mg/dL (ref 2.5–4.6)

## 2021-11-16 MED ORDER — SODIUM CHLORIDE 0.9% FLUSH: 10.0000 mL | INTRAVENOUS | Status: DC | PRN

## 2021-11-16 MED ORDER — DIATRIZOATE MEGLUMINE & SODIUM 66-10 % PO SOLN
90.0000 mL | Freq: Once | ORAL | Status: AC
  Administered 2021-11-16: 14:00:00 90 mL via ORAL
  Filled 2021-11-16: qty 90

## 2021-11-16 MED ORDER — POTASSIUM CHLORIDE CRYS ER 20 MEQ PO TBCR
40.0000 meq | EXTENDED_RELEASE_TABLET | Freq: Two times a day (BID) | ORAL | Status: DC
  Administered 2021-11-16: 11:00:00 40 meq via ORAL
  Filled 2021-11-16: qty 2

## 2021-11-16 MED ORDER — CALCIUM GLUCONATE-NACL 1-0.675 GM/50ML-% IV SOLN
1.0000 g | Freq: Once | INTRAVENOUS | Status: AC
  Administered 2021-11-16: 11:00:00 1000 mg via INTRAVENOUS
  Filled 2021-11-16: qty 50

## 2021-11-16 MED ORDER — TRAVASOL 10 % IV SOLN
INTRAVENOUS | Status: DC
Start: 2021-11-16 — End: 2021-11-16

## 2021-11-16 MED ORDER — CHLORHEXIDINE GLUCONATE CLOTH 2 % EX PADS
6.0000 | MEDICATED_PAD | Freq: Every day | CUTANEOUS | Status: DC
  Administered 2021-11-17 – 2021-11-29 (×14): 6 via TOPICAL

## 2021-11-16 MED ORDER — NYSTATIN 100000 UNIT/ML MT SUSP
5.0000 mL | Freq: Four times a day (QID) | OROMUCOSAL | Status: DC
  Administered 2021-11-16 – 2021-11-24 (×28): 500000 [IU] via ORAL
  Filled 2021-11-16 (×31): qty 5

## 2021-11-16 MED ORDER — ENSURE ENLIVE PO LIQD
237.0000 mL | Freq: Three times a day (TID) | ORAL | Status: DC
  Administered 2021-11-17 – 2021-11-21 (×8): 237 mL via ORAL

## 2021-11-16 MED ORDER — MAGIC MOUTHWASH W/LIDOCAINE
10.0000 mL | Freq: Four times a day (QID) | ORAL | Status: AC
  Administered 2021-11-16 – 2021-11-18 (×4): 10 mL via ORAL
  Filled 2021-11-16 (×10): qty 10

## 2021-11-16 MED ORDER — INSULIN ASPART 100 UNIT/ML IJ SOLN
0.0000 [IU] | Freq: Four times a day (QID) | INTRAMUSCULAR | Status: DC
  Administered 2021-11-16 – 2021-11-25 (×4): 1 [IU] via SUBCUTANEOUS
  Administered 2021-11-26: 01:00:00 2 [IU] via SUBCUTANEOUS
  Administered 2021-11-26 – 2021-11-28 (×6): 1 [IU] via SUBCUTANEOUS

## 2021-11-16 MED ORDER — TRAVASOL 10 % IV SOLN
INTRAVENOUS | Status: AC
  Filled 2021-11-16: qty 403.2

## 2021-11-16 MED ORDER — POTASSIUM CHLORIDE 10 MEQ/100ML IV SOLN: 10.0000 meq | INTRAVENOUS | Status: DC

## 2021-11-16 MED ORDER — SODIUM CHLORIDE 0.9% FLUSH
10.0000 mL | Freq: Two times a day (BID) | INTRAVENOUS | Status: DC
  Administered 2021-11-17: 20 mL
  Administered 2021-11-18 – 2021-11-19 (×4): 10 mL
  Administered 2021-11-20: 20 mL
  Administered 2021-11-20 – 2021-11-23 (×6): 10 mL
  Administered 2021-11-23: 20 mL
  Administered 2021-11-24 (×2): 10 mL
  Administered 2021-11-25: 30 mL
  Administered 2021-11-26 – 2021-11-29 (×9): 10 mL

## 2021-11-16 MED ORDER — POTASSIUM CHLORIDE CRYS ER 20 MEQ PO TBCR
40.0000 meq | EXTENDED_RELEASE_TABLET | ORAL | Status: AC
  Administered 2021-11-16: 08:00:00 40 meq via ORAL
  Filled 2021-11-16: qty 2

## 2021-11-16 MED ORDER — MAGNESIUM SULFATE IN D5W 1-5 GM/100ML-% IV SOLN
1.0000 g | Freq: Once | INTRAVENOUS | Status: AC
  Administered 2021-11-16: 16:00:00 1 g via INTRAVENOUS
  Filled 2021-11-16: qty 100

## 2021-11-16 NOTE — Progress Notes (Addendum)
Nutrition Follow-up  DOCUMENTATION CODES:  Not applicable  INTERVENTION:  Recommend starting TPN per Pharmacy.  Add Ensure Plus High Protein po TID, each supplement provides 350 kcal and 20 grams of protein.   Continue to advance diet as tolerated.  Recommend continuing TPN until patient consistently eats >50% PO.  Encourage PO and supplement intake.  Recommend assistance with all meals.  Discontinue Boost Breeze TID.  NUTRITION DIAGNOSIS:  Inadequate oral intake related to decreased appetite as evidenced by per patient/family report. - new diagnosis  GOAL:  Patient will meet greater than or equal to 90% of their needs. - not meeting PO, to be met with TPN  MONITOR:  Diet advancement, PO intake, Supplement acceptance, Labs, Weight trends, I & O's  REASON FOR ASSESSMENT:  Consult New TPN/TNA  ASSESSMENT:  80 year-old male with medical history of dyslipidemia, COVID-19, bladder cancer s/p laparoscopic radical cystectomy, robotic radical prostatectomy, bilateral pelvic lymph node dissection, extensive LOAs, SBR, and ileal conduit urinary diversion on 10/26 in the setting of invasive bladder cancer, on chemo. He was seen in the Urology Clinic on 11/10 and found to have BP in the 79G systolic. 92/11 - OGT placed 11/14 - OGT removed  Spoke to pt at bedside today. Pt reports having difficulty feeding himself his meals, so he has requested assistance. RD to order feeding assistance while he is admitted.  Spoke with pt about TPN versus EN. Pt declined EN if it is possible to start for preference of TPN.  Pt does not like Boost Breeze supplements. RD to start Ensure TID to promote PO intake. Pt also requested to continue to eat PO while on TPN.  If it is safe to do so, RD recommends this as well to preserve swallow function.  Per Epic, pt ate 100% of full liquid meal (including vanilla pudding, grits, 2% milk, and grape juice). Observed lunch tray untouched while visiting with  pt.  Admit wt: 83 kg Current wt: 87.4 kg  Pt continues to have severe BLE edema, as well as severe generalized, BUE, perineal, and sacral edema.   This is likely masking depletions and contributing to weight.  Recommend starting TPN to supplement intake.  Supplements: Boost Breeze TID  Medications: reviewed; Dulcolax suppository, Gastrograftin once, SSI, Mag Sulfate per IV once  Labs: reviewed; K 3.1 (L)  NUTRITION - FOCUSED PHYSICAL EXAM: Flowsheet Row Most Recent Value  Orbital Region No depletion  Upper Arm Region No depletion  Thoracic and Lumbar Region Unable to assess  Buccal Region No depletion  Temple Region No depletion  Clavicle Bone Region No depletion  Clavicle and Acromion Bone Region No depletion  Scapular Bone Region No depletion  Dorsal Hand No depletion  Patellar Region No depletion  Anterior Thigh Region No depletion  Posterior Calf Region No depletion  Edema (RD Assessment) Severe  [BLE]  Hair Reviewed  Eyes Reviewed  Mouth Reviewed  [poor dentition]  Skin Reviewed  Nails Reviewed   Diet Order:   Diet Order             Diet full liquid Room service appropriate? Yes; Fluid consistency: Thin  Diet effective now                  EDUCATION NEEDS:  Education needs have been addressed  Skin:  Skin Assessment: Skin Integrity Issues: Skin Integrity Issues:: Incisions Incisions: abdomen (10/26)  Last BM:  11/16/21 - Type 7, large  Height:  Ht Readings from Last 1 Encounters:  11/20/2021  '5\' 7"'  (1.702 m)   Weight:  Wt Readings from Last 1 Encounters:  11/15/21 87.4 kg   BMI:  Body mass index is 30.18 kg/m.  Estimated Nutritional Needs:  Kcal:  2030-2275 kcal Protein:  100-115 grams Fluid:  >/= 2.2 L/day  Derrel Nip, RD, LDN (she/her/hers) Clinical Inpatient Dietitian RD Pager/After-Hours/Weekend Pager # in [place]

## 2021-11-16 NOTE — Progress Notes (Signed)
Physical Therapy Treatment Patient Details Name: William [name] MRN: [id number] DOB: February 05, 1941 Today's Date: 11/16/2021   History of Present Illness Patient  is a 80 year old male who presented from urology clinic on 11/10 with low blood pressure. patient was found to have septic shock. PMH: s/p robotic cystoprostatectomy, pelvic node dissection , conduit diversion, extensive adhesioloysis , small bowel resection and cysto / ICG injection on 10/25/21 due to bladder cancer, tinnitus, Covid-19, back surgery    PT Comments    Pt reports 10/10 pain "all over" but especially L lower and mid back. RN administered pain medication during PT session. Pt ambulated 100' with RW with wide base of support, frequent standing rest break, distance limited by pain and fatigue.     Recommendations for follow up therapy are one component of a multi-disciplinary discharge planning process, led by the attending physician.  Recommendations may be updated based on patient status, additional functional criteria and insurance authorization.  Follow Up Recommendations  Home health PT     Assistance Recommended at Discharge Set up Supervision/Assistance  Equipment Recommendations  None recommended by PT    Recommendations for Other Services       Precautions / Restrictions Precautions Precautions: Fall Precaution Comments: urostomy Restrictions Weight Bearing Restrictions: No     Mobility  Bed Mobility Overal bed mobility: Needs Assistance Bed Mobility: Supine to Sit     Supine to sit: Min assist;HOB elevated     General bed mobility comments: assist to raise trunk    Transfers Overall transfer level: Needs assistance Equipment used: Rolling walker (2 wheels) Transfers: Sit to/from Stand Sit to Stand: Min guard;From elevated surface           General transfer comment: increased time    Ambulation/Gait Ambulation/Gait assistance: Min guard Gait Distance (Feet): 42 Feet Assistive  device: Rolling walker (2 wheels) Gait Pattern/deviations: Step-through pattern;Decreased stride length;Wide base of support Gait velocity: decreased     General Gait Details: very slow velocity, frequent standing rest breaks, wide BOS, distance limited by 10/10 L back pain   Stairs             Wheelchair Mobility    Modified Rankin (Stroke Patients Only)       Balance Overall balance assessment: Needs assistance   Sitting balance-Leahy Scale: Good     Standing balance support: Reliant on assistive device for balance Standing balance-Leahy Scale: Poor                              Cognition Arousal/Alertness: Awake/alert Behavior During Therapy: WFL for tasks assessed/performed Overall Cognitive Status: Within Functional Limits for tasks assessed                                          Exercises General Exercises - Lower Extremity Ankle Circles/Pumps: AROM;Both;10 reps;Seated Long Arc Quad: AROM;Both;5 reps;Seated Hip Flexion/Marching: AROM;Both;5 reps;Seated    General Comments        Pertinent Vitals/Pain Pain Score: 10-Worst pain ever Pain Location: L lower back Pain Descriptors / Indicators: Discomfort;Grimacing Pain Intervention(s): Limited activity within patient's tolerance;Monitored during session;Patient requesting pain meds-RN notified    Home Living                          Prior Function  PT Goals (current goals can now be found in the care plan section) Acute Rehab PT Goals PT Goal Formulation: With patient Time For Goal Achievement: 11/08/2021 Potential to Achieve Goals: Good Progress towards PT goals: Progressing toward goals    Frequency    Min 3X/week      PT Plan Current plan remains appropriate    Co-evaluation              AM-PAC PT "6 Clicks" Mobility   Outcome Measure  Help needed turning from your back to your side while in a flat bed without using  bedrails?: A Little Help needed moving from lying on your back to sitting on the side of a flat bed without using bedrails?: A Little Help needed moving to and from a bed to a chair (including a wheelchair)?: A Little Help needed standing up from a chair using your arms (e.g., wheelchair or bedside chair)?: A Little Help needed to walk in hospital room?: A Little Help needed climbing 3-5 steps with a railing? : A Lot 6 Click Score: 17    End of Session Equipment Utilized During Treatment: Gait belt Activity Tolerance: Patient limited by pain Patient left: in chair;with call bell/phone within reach;with nursing/sitter in room Nurse Communication: Mobility status;Other (comment);Patient requests pain meds (urostomy back needs to be emptied) PT Visit Diagnosis: Difficulty in walking, not elsewhere classified (R26.2);Muscle weakness (generalized) (M62.81);Pain     Time: 3112-1624 PT Time Calculation (min) (ACUTE ONLY): 37 min  Charges:  $Gait Training: 8-22 mins $Therapeutic Activity: 8-22 mins                    Blondell Reveal Kistler PT 11/16/2021  Acute Rehabilitation Services Pager [phone] Office [phone]

## 2021-11-16 NOTE — Progress Notes (Addendum)
PHARMACY - TOTAL PARENTERAL NUTRITION CONSULT NOTE   Indication: Prolonged ileus  Patient Measurements: Height: 5\' 7"  (170.2 cm) Weight: 87.4 kg (192 lb 10.9 oz) IBW/kg (Calculated) : 66.1 TPN AdjBW (KG): 70.3 Body mass index is 30.18 kg/m.   Assessment: 75 y/oM with PMH of bladder cancer who underwent cystectomy and ileal conduit on 10/25/21 re-admitted on 11/21/2021 with septic shock secondary to urinary source. CT concerning for SBO. NG removed 11/13/21 PM and started on CLD, but then developed emesis 11/14/21. He remains distended, having hiccups, and not taking in much PO. Also continues to have flatus and liquid stools. Consistent with pSBO/ileus picture per surgery. Pharmacy consulted for TPN dosing.   Glucose / Insulin: Glucose 95 on AM BMET. No insulin currently ordered. No hx of DM. Electrolytes: K+ low this AM at 3.2. Mag slightly low at 1.9. All others WNL. Goal to keep K =/> 4, Mag =/> 2. Note that repeat labs ordered by MD were collected at noon very soon after repletion of electrolytes, so likely not truly indicative of prior repletion.   Renal: SCr 1.07, BUN 17 Hepatic: AST/ALT, Alk Phos, Tbili WNL. Albumin 1.7. Prealbumin < 5 (11/11) Triglycerides: ordered for AM Intake / Output; MIVF: UOP 1500 mL; I/O + 660 mL (+ 1947 mL since admit). LR discontinued by MD.  GI Imaging: AXR: Abnormal dilation of small-bowel loops with interval worsening suggesting ileus or partial obstruction. GI Surgeries / Procedures: Robotic-assisted laparoscopic radical cystectomy, radical prostatectomy, bilateral pelvic lymph node dissection, extensive laparoscopic adhesiolysis, small bowel resection, ileal conduit urinary diversion on 10/25/21   Central access: PICC to be placed today, 11/16/21 TPN start date: 11/16/21  Nutritional Goals: Goal TPN rate is 110 mL/hr (provides 111 g of protein and 2155 kcals per day)  RD Assessment: Estimated Needs Total Energy Estimated Needs: 2030-2275  kcal Total Protein Estimated Needs: 100-115 grams Total Fluid Estimated Needs: >/= 2.2 L/day  Current Nutrition:  Full liquids Boost Breeze TID (intermittently refusing) > Ensure Plus TID per RD Start TPN  Plan:  KCl 32mEq PO x 2 doses this AM per MD Calcium Gluconate 1g IV x 1 per MD Magnesium Sulfate 1g IV x 1 now   Start TPN at 40 mL/hr at 1800 Electrolytes in TPN: Na 24mEq/L, K 23mEq/L, Ca 45mEq/L, Mg 42mEq/L, and Phos 88mmol/L. Cl:Ac 1:1 Add standard MVI and trace elements to TPN Add thiamine 100mg  daily to TPN bag (d/c IV push order) Initiate Sensitive q6h SSI and adjust as needed  MIVF per MD (none currently) Monitor TPN labs on Mon/Thurs CMET, Mag, Phos in AM   Lindell Spar, PharmD, BCPS Clinical Pharmacist  11/16/2021,12:41 PM

## 2021-11-16 NOTE — Plan of Care (Signed)
  Problem: Health Behavior/Discharge Planning: Goal: Ability to manage health-related needs will improve Outcome: Progressing   Problem: Clinical Measurements: Goal: Ability to maintain clinical measurements within normal limits will improve Outcome: Progressing Goal: Will remain free from infection Outcome: Progressing Goal: Diagnostic test results will improve Outcome: Progressing Goal: Respiratory complications will improve Outcome: Progressing Goal: Cardiovascular complication will be avoided Outcome: Progressing   Problem: Activity: Goal: Risk for activity intolerance will decrease Outcome: Progressing   Problem: Nutrition: Goal: Adequate nutrition will be maintained Outcome: Progressing   Problem: Coping: Goal: Level of anxiety will decrease Outcome: Progressing   Problem: Pain Managment: Goal: General experience of comfort will improve Outcome: Progressing   Problem: Safety: Goal: Ability to remain free from injury will improve Outcome: Progressing   

## 2021-11-16 NOTE — Progress Notes (Addendum)
Patient ID: William [name], male   DOB: [birth year], 80 y.o.   MRN: [id number] [facility] Surgery Progress Note     Subjective: CC-  Up in chair. Feels about the same. Continues to pass flatus. He had a loose BM yesterday and one this morning. He denies any n/v but has hiccups. On liquid diet but not taking in much. WBC up 19.7, afebrile  Objective: Vital signs in last 24 hours: Temp:  [98 F (36.7 C)-99.1 F (37.3 C)] 98 F (36.7 C) (11/17 0543) Pulse Rate:  [109-110] 109 (11/17 0543) Resp:  [16-18] 18 (11/17 0543) BP: (133-142)/(78-90) 133/90 (11/17 0543) SpO2:  [94 %-95 %] 95 % (11/17 0543) Last BM Date: 11/14/21  Intake/Output from previous day: 11/16 0701 - 11/17 0700 In: 2159.7 [P.O.:600; I.V.:634.7; IV Piggyback:925] Out: 1500 [Urine:1500] Intake/Output this shift: Total I/O In: 1363.4 [I.V.:1363.4] Out: -   PE: Gen:  Alert, NAD Card:  RRR, pitting upper and lower extremity edema  Pulm:  rate and effort normal Abd: Soft, moderate abdominal distention with mid diffuse TTP, RLQ urostomy viable with clear yellow urine, midline incision c/d/I, anasarca GU: scrotal and penile edema  Skin: warm and dry, no rashes or lesions Psych: A&Ox3  Lab Results:  Recent Labs    11/15/21 0527 11/16/21 0527  WBC 16.1* 19.7*  HGB 8.3* 8.6*  HCT 26.0* 26.9*  PLT 283 323   BMET Recent Labs    11/15/21 1508 11/16/21 0527  NA 137 138  K 3.5 3.2*  CL 103 106  CO2 25 25  GLUCOSE 108* 95  BUN 17 17  CREATININE 0.83 1.07  CALCIUM 7.4* 7.4*   PT/INR No results for input(s): LABPROT, INR in the last 72 hours. CMP     Component Value Date/Time   NA 138 11/16/2021 0527   NA 137 09/21/2020 0942   K 3.2 (L) 11/16/2021 0527   CL 106 11/16/2021 0527   CO2 25 11/16/2021 0527   GLUCOSE 95 11/16/2021 0527   BUN 17 11/16/2021 0527   BUN 5 (L) 09/21/2020 0942   CREATININE 1.07 11/16/2021 0527   CREATININE 0.72 06/09/2021 1232   CREATININE 0.90 02/07/2017 1032   CALCIUM  7.4 (L) 11/16/2021 0527   PROT 5.4 (L) 11/05/2021 1016   PROT 7.2 03/10/2021 0845   ALBUMIN 2.1 (L) 11/20/2021 1016   ALBUMIN 4.4 03/10/2021 0845   AST 49 (H) 11/18/2021 1016   AST 22 06/09/2021 1232   ALT 21 11/20/2021 1016   ALT 10 06/09/2021 1232   ALKPHOS 57 11/24/2021 1016   BILITOT 1.0 11/06/2021 1016   BILITOT 0.4 06/09/2021 1232   GFRNONAA >60 11/16/2021 0527   GFRNONAA >60 06/09/2021 1232   GFRNONAA 84 02/07/2017 1032   GFRAA >60 09/23/2020 1133   GFRAA >89 02/07/2017 1032   Lipase  No results found for: LIPASE     Studies/Results: DG Abd Portable 1V  Result Date: 11/15/2021 CLINICAL DATA:  Abdominal pain, vomiting EXAM: PORTABLE ABDOMEN - 1 VIEW COMPARISON:  11/12/2021 FINDINGS: There is interval removal of enteric tube. There is dilation of small-bowel loops measuring up to 5.8 cm in maximum diameter. There is moderate gaseous distention of colon. Stomach is not distended. Ureteral stents are noted on both sides apparently placed through neobladder reconstruction in the right lower abdomen. Laminectomy and fusion is seen in the lumbar spine. IMPRESSION: There is abnormal dilation of small-bowel loops with interval worsening suggesting ileus or partial obstruction. Electronically Signed   By: Elmer Picker  M.D.   On: 11/15/2021 10:28   VAS [place] LOWER EXTREMITY VENOUS (DVT)  Result Date: 11/14/2021  Lower Venous DVT Study Patient Name:  William [name]  Date of Exam:   11/14/2021 Medical Rec #: [id number]       Accession #:    [id number] Date of Birth: 11/28/1941       Patient Gender: M Patient Age:   10 years Exam Location:  [facility] Procedure:      VAS [place] LOWER EXTREMITY VENOUS (DVT) Referring Phys: Margie Billet --------------------------------------------------------------------------------  Indications: Swelling.  Risk Factors: None identified. Limitations: Poor ultrasound/tissue interface. Comparison Study: No prior studies. Performing Technologist: Oliver Hum RVT  Examination Guidelines: A complete evaluation includes B-mode imaging, spectral Doppler, color Doppler, and power Doppler as needed of all accessible portions of each vessel. Bilateral testing is considered an integral part of a complete examination. Limited examinations for reoccurring indications may be performed as noted. The reflux portion of the exam is performed with the patient in reverse Trendelenburg.  +---------+---------------+---------+-----------+----------+--------------+ RIGHT    CompressibilityPhasicitySpontaneityPropertiesThrombus Aging +---------+---------------+---------+-----------+----------+--------------+ CFV      Full           Yes      Yes                                 +---------+---------------+---------+-----------+----------+--------------+ SFJ      Full                                                        +---------+---------------+---------+-----------+----------+--------------+ FV Prox  Full                                                        +---------+---------------+---------+-----------+----------+--------------+ FV Mid                  Yes      Yes                                 +---------+---------------+---------+-----------+----------+--------------+ FV Distal               Yes      Yes                                 +---------+---------------+---------+-----------+----------+--------------+ PFV      Full                                                        +---------+---------------+---------+-----------+----------+--------------+ POP      Full           Yes      Yes                                 +---------+---------------+---------+-----------+----------+--------------+  PTV      Full                                                        +---------+---------------+---------+-----------+----------+--------------+ PERO     Full                                                         +---------+---------------+---------+-----------+----------+--------------+   +---------+---------------+---------+-----------+----------+-------------------+ LEFT     CompressibilityPhasicitySpontaneityPropertiesThrombus Aging      +---------+---------------+---------+-----------+----------+-------------------+ CFV      Full           Yes      Yes                                      +---------+---------------+---------+-----------+----------+-------------------+ SFJ      Full                                                             +---------+---------------+---------+-----------+----------+-------------------+ FV Prox  Full                                                             +---------+---------------+---------+-----------+----------+-------------------+ FV Mid   Full                                                             +---------+---------------+---------+-----------+----------+-------------------+ FV Distal               Yes      Yes                                      +---------+---------------+---------+-----------+----------+-------------------+ PFV      Full                                                             +---------+---------------+---------+-----------+----------+-------------------+ POP      Full           Yes      Yes                                      +---------+---------------+---------+-----------+----------+-------------------+ PTV  Full                                                             +---------+---------------+---------+-----------+----------+-------------------+ PERO                                                  Not well visualized +---------+---------------+---------+-----------+----------+-------------------+     Summary: RIGHT: - There is no evidence of deep vein thrombosis in the lower extremity. However, portions of this examination were limited- see technologist comments  above.  - No cystic structure found in the popliteal fossa.  LEFT: - There is no evidence of deep vein thrombosis in the lower extremity. However, portions of this examination were limited- see technologist comments above.  - No cystic structure found in the popliteal fossa.  *See table(s) above for measurements and observations. Electronically signed by Deitra Mayo MD on 11/14/2021 at 1:55:31 PM.    Final    VAS [place] UPPER EXTREMITY VENOUS DUPLEX  Result Date: 11/14/2021 UPPER VENOUS STUDY  Patient Name:  William [name]  Date of Exam:   11/14/2021 Medical Rec #: [id number]       Accession #:    [id number] Date of Birth: 06/21/1941       Patient Gender: M Patient Age:   53 years Exam Location:  [facility] Procedure:      VAS [place] UPPER EXTREMITY VENOUS DUPLEX Referring Phys: Margie Billet --------------------------------------------------------------------------------  Indications: Edema Risk Factors: None identified. Limitations: Poor ultrasound/tissue interface and line. Comparison Study: No prior studies. Performing Technologist: Oliver Hum RVT  Examination Guidelines: A complete evaluation includes B-mode imaging, spectral Doppler, color Doppler, and power Doppler as needed of all accessible portions of each vessel. Bilateral testing is considered an integral part of a complete examination. Limited examinations for reoccurring indications may be performed as noted.  Right Findings: +----------+------------+---------+-----------+----------+-------+ RIGHT     CompressiblePhasicitySpontaneousPropertiesSummary +----------+------------+---------+-----------+----------+-------+ IJV           Full       Yes       Yes                      +----------+------------+---------+-----------+----------+-------+ Subclavian    Full       Yes       Yes                      +----------+------------+---------+-----------+----------+-------+ Axillary      Full       Yes       Yes                       +----------+------------+---------+-----------+----------+-------+ Brachial      Full       Yes       Yes                      +----------+------------+---------+-----------+----------+-------+ Radial        Full                                          +----------+------------+---------+-----------+----------+-------+  Ulnar         Full                                          +----------+------------+---------+-----------+----------+-------+ Cephalic      Full                                          +----------+------------+---------+-----------+----------+-------+ Basilic       Full                                          +----------+------------+---------+-----------+----------+-------+  Left Findings: +----------+------------+---------+-----------+----------+-------+ LEFT      CompressiblePhasicitySpontaneousPropertiesSummary +----------+------------+---------+-----------+----------+-------+ IJV           Full       Yes       Yes                      +----------+------------+---------+-----------+----------+-------+ Subclavian    Full       Yes       Yes                      +----------+------------+---------+-----------+----------+-------+ Axillary      Full       Yes       Yes                      +----------+------------+---------+-----------+----------+-------+ Brachial      Full       Yes       Yes                      +----------+------------+---------+-----------+----------+-------+ Radial        Full                                          +----------+------------+---------+-----------+----------+-------+ Ulnar         Full                                          +----------+------------+---------+-----------+----------+-------+ Cephalic      Full                                          +----------+------------+---------+-----------+----------+-------+ Basilic       Full                                           +----------+------------+---------+-----------+----------+-------+  Summary:  Right: No evidence of deep vein thrombosis in the upper extremity. No evidence of superficial vein thrombosis in the upper extremity.  Left: No evidence of deep vein thrombosis in the upper extremity. No evidence of superficial vein thrombosis in the upper extremity.  *See table(s) above for measurements and observations.  Diagnosing physician: Deitra Mayo  MD Electronically signed by Deitra Mayo MD on 11/14/2021 at 1:55:47 PM.    Final     Anti-infectives: Anti-infectives (From admission, onward)    Start     Dose/Rate Route Frequency Ordered Stop   11/12/21 0800  Ampicillin-Sulbactam (UNASYN) 3 g in sodium chloride 0.9 % 100 mL IVPB        3 g 200 mL/hr over 30 Minutes Intravenous Every 6 hours 11/12/21 0712 11/18/21 2359   11/10/21 1400  cefTRIAXone (ROCEPHIN) 2 g in sodium chloride 0.9 % 100 mL IVPB  Status:  Discontinued        2 g 200 mL/hr over 30 Minutes Intravenous Daily 11/10/21 1048 11/12/21 0712   11/10/21 0800  ceFEPIme (MAXIPIME) 2 g in sodium chloride 0.9 % 100 mL IVPB  Status:  Discontinued        2 g 200 mL/hr over 30 Minutes Intravenous Every 12 hours 11/10/21 0618 11/10/21 1048   11/10/21 0630  ceFEPIme (MAXIPIME) 2 g in sodium chloride 0.9 % 100 mL IVPB  Status:  Discontinued        2 g 200 mL/hr over 30 Minutes Intravenous Every 24 hours 11/13/2021 1622 11/10/21 0618   11/03/2021 1624  vancomycin variable dose per unstable renal function (pharmacist dosing)  Status:  Discontinued         Does not apply See admin instructions 11/26/2021 1624 11/10/21 0613   11/28/2021 1030  ceFEPIme (MAXIPIME) 2 g in sodium chloride 0.9 % 100 mL IVPB        2 g 200 mL/hr over 30 Minutes Intravenous  Once 11/12/2021 1016 11/12/2021 1133   11/06/2021 1030  metroNIDAZOLE (FLAGYL) IVPB 500 mg        500 mg 100 mL/hr over 60 Minutes Intravenous  Once 11/23/2021 1016 11/17/2021 1915   10/31/2021  1030  vancomycin (VANCOCIN) IVPB 1000 mg/200 mL premix  Status:  Discontinued        1,000 mg 200 mL/hr over 60 Minutes Intravenous  Once 11/13/2021 1016 11/01/2021 1023   10/31/2021 1030  vancomycin (VANCOREADY) IVPB 1500 mg/300 mL        1,500 mg 150 mL/hr over 120 Minutes Intravenous  Once 11/23/2021 1023 11/26/2021 1341        Assessment/Plan pSBO  vs ileus - s/p Robotic-assisted laparoscopic radical cystectomy, radical prostatectomy, Bilateral pelvic lymph node dissection, Extensive laparoscopic adhesiolysis, Small bowel resection, Ileal conduit urinary diversion 10/26/21 [name] - CT scan 11/10 with SBO pattern, possibly a pSBO at the level of the surgical anastomosis  - seemed to be clinically improving, NG removed 11/14 PM, started on CLD, developed emesis 11/15. He has had no further n/v but remains distended, hiccups, not taking in much PO. Also continues to have flatus and liquid stools. Consistent with pSBO/ileus picture. Will order gastrograffin to take PO and 8 hour delayed film. Patient has been admitted for 1 week with poor oral intake, will order PICC/TPN.   Small volume pneumoperitoneum - reviewed with Dr. Johney Maine and [name] 11/10, suspect this is related to insufflation/post-operative state.   ID - maxipime/ flagyl/ vancomycin 11/10, switched to Rocephin then Unasyn based on urine cx per primary team VTE - SCDs, sq heparin FEN - FLD, Boost, start TPN Foley - none. Urostomy/Ileal conduit    Septic shock - resolved Klebsiella Oxytoca UTI  Asthma/ COPD Chronic back pain HLD AKI Possible UTI   LOS: 7 days    Wellington Hampshire, [facility] Surgery 11/16/2021, 9:32 AM Please see  Amion for pager number during day hours 7:00am-4:30pm

## 2021-11-16 NOTE — Plan of Care (Signed)
  Problem: Coping: Goal: Level of anxiety will decrease Outcome: Progressing   Problem: Pain Managment: Goal: General experience of comfort will improve Outcome: Progressing   

## 2021-11-16 NOTE — Progress Notes (Signed)
The patient is injury-free, afebrile, alert, and oriented X 3. Vital signs were within the baseline during this shift. He complained of generalized pain. Pt was compliant with taking most of his medications. Pt denies chest pain, SOB, nausea, vomiting, dizziness, signs or symptoms of bleeding or infection or acute changes during this shift. We will continue to monitor and work toward achieving the care plan goals

## 2021-11-16 NOTE — Progress Notes (Signed)
Peripherally Inserted Central Catheter Placement  The IV Nurse has discussed with the patient and/or persons authorized to consent for the patient, the purpose of this procedure and the potential benefits and risks involved with this procedure.  The benefits include less needle sticks, lab draws from the catheter, and the patient may be discharged home with the catheter. Risks include, but not limited to, infection, bleeding, blood clot (thrombus formation), and puncture of an artery; nerve damage and irregular heartbeat and possibility to perform a PICC exchange if needed/ordered by physician.  Alternatives to this procedure were also discussed.  Bard Power PICC patient education guide, fact sheet on infection prevention and patient information card has been provided to patient /or left at bedside.    PICC Placement Documentation  PICC Double Lumen 60/10/93 PICC Right Basilic 41 cm 0 cm (Active)  Indication for Insertion or Continuance of Line Administration of hyperosmolar/irritating solutions (i.e. TPN, Vancomycin, etc.) 11/16/21 1500  Exposed Catheter (cm) 0 cm 11/16/21 1500  Site Assessment Clean;Dry;Intact 11/16/21 1500  Lumen #1 Status Flushed;Saline locked;Blood return noted 11/16/21 1500  Lumen #2 Status Flushed;Saline locked;Blood return noted 11/16/21 1500  Dressing Type Transparent;Securing device 11/16/21 1500  Dressing Status Clean;Dry;Intact 11/16/21 1500  Antimicrobial disc in place? Yes 11/16/21 1500  Safety Lock Not Applicable 23/55/73 2202  Line Care Connections checked and tightened 11/16/21 1500  Dressing Intervention New dressing 11/16/21 1500  Dressing Change Due 11/23/21 11/16/21 1500       Holley Bouche Renee 11/16/2021, 3:28 PM

## 2021-11-16 NOTE — Progress Notes (Addendum)
PROGRESS NOTE    William [name]  WKG:[id number] DOB: Sep 27, 1941 DOA: 11/24/2021 PCP: [name], MD    Brief Narrative:  William [name] is a 80 year old male with past medical history significant for bladder cancer s/p robotic assisted laparoscopic radical cystectomy/prostatectomy, bilateral pelvic lymph node dissection, extensive laparoscopic adhesiolysis, small bowel resection with ileal conduit urinary diversion 10/26/21 by [name], COPD, history of tobacco use disorder who presented to [facility] ED on 11/10 from urology clinic with finding of low blood pressure.  Following recent discharge, patient has been dealing with constipation with poor oral intake with darker urine.  Also complaining of increasing left lower quadrant pain and intermittent fevers at home.  Reports that his ileal conduit has been functioning normally.  Pain worsened to the point where his pain medications were not effective.  EMS was called on 11/9 but was not transported and was instructed to add naproxen to his pain regimen which seemed to give some relief.  Patient was seen in follow-up in the urology clinic on 1111 was found to have blood pressure in the 45O systolic and was referred to the ED for further assessment.  Patient recent hospitalized and underwent robotic assisted laparoscopic radical cystectomy/prostatectomy with bilateral pelvic lymph node dissection, extensive laparoscopic adhesion anxiolysis, small bowel resection and ileal conduit urinary diversion on 10/25/2021 by [name] for his invasive bladder cancer; hospital course was largely uneventful and was discharged on postop day 8 11/02/2021.  In the ED, SBP's in the 60s, treated with IV fluids and started on Levophed for blood pressure support.  Sodium 131, potassium 5.2, chloride 98, CO2 21, glucose 88, BUN 43, creatinine 2.87, anion gap 12, lactic acid 4.6, WBC 7, hemoglobin 9.2, platelets 364 with left shift.  Urinalysis from ileal conduit with  100 protein, large leukocytes, greater than 50 WBCs and many bacteria.  CT chest/abdomen/pelvis were notable for findings concerning for underlying ILD, coronary artery calcification, concern for small bowel obstruction/possible transition point in the lower abdomen/pelvis near surgical anastomosis with small/moderate volume ascites in the pelvis/left abdomen, small amount of free intraperitoneal air (?post-op), small to moderate amount of subcutaneous emphysema without accompanying fluid collection/stranding, cystoprostatectomy changes with right-sided urinary ostomy, bilateral ureteral stents extending through the ostomy with no hydronephrosis.  Patient was admitted to the PCCM service due to septic shock from likely urinary source.  Care transferred from [facility] to hospital service on 11/11/2021.  11/16/2021: Reports his mouth is burning.  Nystatin Mouthwash ordered with lidocaine.   Assessment & Plan:   Principal Problem:   Septic shock ([place]) Active Problems:   Chronic pain   Bladder cancer ([place])   Bacteremia due to Gram-negative bacteria   UTI due to Klebsiella species   Septic shock, POA Klebsiella oxytoca UTI/septicemia, POA Patient presenting to ED from urology clinic after being found with systolic blood pressures in the 50s.  Patient was given IV fluid resuscitation without improvement and eventually placed on Levophed for vasopressor support.  Patient met septic shock criteria on admission with hypotension not improved with IV fluid resuscitation requiring Levophed in the setting of endorgan damage with acute renal failure, lactic acidosis with source of infection urinary tract with septicemia.  Patient was started on empiric antibiotics initially with vancomycin, cefepime, metronidazole and was de-escalated to ceftriaxone based on cultures. --Now titrated off of vasopressors --WBC 7.0>12.5>18.6>21.5>19.3>16.2>20.7 --PCT 41.64>32.48>18.72>4.67>2.62 --Blood cultures x2: Klebsiella  oxytoca --Urine culture >100K Klebsiella oxytoca/Enterococcus faecalis --Unasyn 3g IV q6h, plan 7-day course --CBC daily  Small  bowel obstruction, improving. CT abdomen/pelvis on admission with concern for small bowel obstruction with possible transition point in the lower abdomen/pelvis near surgical anastomosis site.  General surgery believes this is likely secondary to inflammatory process from recent abdominal surgery and not fibrous/adhesion in nature; and recommend conservative management with NG tube and bowel rest. --General surgery/urology following, appreciate assistance --NGT removed today --Diet advanced to full liquids --Further per general surgery/urology Abdominal x-ray personally reviewed on 11/15/2021 showed partial small bowel obstruction/ileus Started simethicone 4 times daily x3 days and Dulcolax 10 mg rectally x3 days. Patient had a large bowel movement on 11/15/2021. Optimize magnesium and potassium levels Goal potassium greater than 4.0 Goal magnesium greater than 2.0. Continue to mobilize as tolerated  Acute renal failure: resolved Etiology likely secondary to prolonged prerenal state from dehydration and poor oral intake versus ATN from hypotension/septic shock as above. --Cr 2.87>3.00>1.67>1.21>1.04>0.90>0.86 --Discontinue IV fluids today with advancement of diet to full liquids --Avoid nephrotoxins, renal dose all medications --BMP daily  Refractory hypokalemia Serum potassium 31.1 from 2.9 goal potassium greater than 4.0 in the setting of bowel obstruction. Magnesium level at goal 2.0. Repleted orally Repeat BMP in the morning  Invasive muscle bladder cancer s/p radical cystectomy, prostatectomy and ileal conduit Underwent robotic assisted laparoscopic radical cystectomy, robotic radical prostatectomy, bilateral pelvic lymph node dissection, extensive laparoscopic adhesiolysis, small bowel resection and ileal conduit urinary diversion on 10/25/2021 by Dr.  [name] of urology. --Urology following, appreciate assistance --Continue ostomy care; monitor urinary output  Oral thrush Nystatin Mouthwash ordered with lidocaine.  COPD History of tobacco abuse --Incruse Ellipta 1 puff daily --DuoNebs as needed --Currently on room air  Normocytic anemia  Chemotherapy 8 months ago 9.2>9.5>9.2>8.8>9.4>9.2>8.9> 8.3 --Transfuse for hemoglobin less than 7.0 or active bleeding --CBC daily Hemoglobin is downtrending Will obtain iron studies, follow results, IV Feraheme if low.  Suspected ILD Noted on CT imaging this admission.  History of tobacco abuse. --Flonase --Outpatient pulmonology follow-up  Chronic back pain At baseline on gabapentin, oxycodone, Zanaflex. --Holding home oral medicines due to small bowel obstruction --Morphine 2-4mg  IV q4h PRN  Weakness/deconditioning/debility: --Needs increased mobility, OOB to chair BID --PT/OT recommending home health on discharge. -Continue to mobilize as tolerated.  Moderate protein calorie malnutrition Albumin 2.1 Moderate muscle mass loss Encourage oral intake as tolerated Oral supplement when can tolerate a diet.   DVT prophylaxis: heparin injection 5,000 Units Start: 11/22/2021 1515 SCDs Start: 11/04/2021 1502   Code Status: Full Code Family Communication: Updated family present at bedside this morning  Disposition Plan:  Level of care: Med-Surg Status is: Inpatient  Remains inpatient appropriate because: Continues on IV antibiotics, NG tube removed today, general surgery advancing diet, needs to demonstrate toleration of diet before able discharge home.   Consultants:  PCCM - signed off 11/12 General surgery Urology  Procedures:  Central line placement 11/10  Antimicrobials:  Ceftriaxone 11/11 - 11/13 Vancomycin 11/10 - 11/11 Cefepime 11/10 - 11/11 Metronidazole 11/10 - 11/10 Unasyn 11/13>>     Objective: Vitals:   11/15/21 1358 11/15/21 2205 11/16/21 0543 11/16/21  1228  BP: 134/78 (!) 142/85 133/90 (!) 142/85  Pulse: (!) 109 (!) 110 (!) 109 (!) 106  Resp: 16 18 18 16   Temp: 99.1 F (37.3 C) 98.9 F (37.2 C) 98 F (36.7 C) 97.8 F (36.6 C)  TempSrc: Oral Oral Oral Oral  SpO2: 94% 95% 95% 95%  Weight:      Height:        Intake/Output Summary (Last 24  hours) at 11/16/2021 1741 Last data filed at 11/16/2021 1156 Gross per 24 hour  Intake 3633.13 ml  Output 1350 ml  Net 2283.13 ml   Filed Weights   11/12/21 0500 11/13/21 0603 11/15/21 0500  Weight: 80 kg 83.7 kg 87.4 kg    Examination:  General exam: Frail-appearing in no acute distress.  He is alert and oriented x3. Respiratory system: Clear to Auscultation No Wheezes or Rales.  Good respiratory effort.   Cardiovascular system: Regular rate and rhythm no rubs or gallops. Gastrointestinal system: Mildly tender at the umbilicus region.  Bowel sounds present.   Central nervous system: Somnolent but easily arousable to voices.  No focal neurological deficits.   Extremities: 1+ pitting edema in lower extremities bilaterally.   Skin: No rashes or ulcerative lesions noted. Psychiatry: Mood is appropriate for condition .   Data Reviewed: I have personally reviewed following labs and imaging studies  CBC: Recent Labs  Lab 11/13/21 0319 11/14/21 0313 11/15/21 0527 11/16/21 0527 11/16/21 1201  WBC 19.3* 16.2* 16.1* 19.7* 20.7*  NEUTROABS  --   --   --   --  17.7*  HGB 9.2* 8.9* 8.3* 8.6* 8.5*  HCT 28.8* 28.2* 26.0* 26.9* 26.9*  MCV 92.0 92.2 90.9 92.1 93.1  PLT 266 263 283 323 431   Basic Metabolic Panel: Recent Labs  Lab 11/10/21 0500 11/11/21 0542 11/12/21 0319 11/13/21 0319 11/14/21 0313 11/15/21 0527 11/15/21 1508 11/16/21 0527 11/16/21 1201  NA 134*   < > 142   < > 141 140 137 138 136  K 4.6   < > 3.8   < > 3.3* 2.9* 3.5 3.2* 3.1*  CL 100   < > 105   < > 106 106 103 106 104  CO2 23   < > 27   < > $R'27 27 25 25 25  'Kv$ GLUCOSE 87   < > 105*   < > 119* 116* 108* 95 93   BUN 35*   < > 26*   < > $R'22 18 17 17 19  'ew$ CREATININE 1.67*   < > 1.04   < > 0.84 0.82 0.83 1.07 1.00  CALCIUM 7.4*   < > 7.9*   < > 7.5* 7.4* 7.4* 7.4* 7.2*  MG 2.0  --  2.4  --  2.1  --  2.0 1.9  --   PHOS 3.7  --  3.0  --   --   --   --  3.3  --    < > = values in this interval not displayed.   GFR: Estimated Creatinine Clearance: 63.2 mL/min (by C-G formula based on SCr of 1 mg/dL). Liver Function Tests: Recent Labs  Lab 11/16/21 1201  AST 25  ALT 16  ALKPHOS 86  BILITOT 0.9  PROT 5.2*  ALBUMIN 1.7*   No results for input(s): LIPASE, AMYLASE in the last 168 hours. No results for input(s): AMMONIA in the last 168 hours. Coagulation Profile: Recent Labs  Lab 11/10/21 0500  INR 1.5*   Cardiac Enzymes: No results for input(s): CKTOTAL, CKMB, CKMBINDEX, TROPONINI in the last 168 hours. BNP (last 3 results) No results for input(s): PROBNP in the last 8760 hours. HbA1C: No results for input(s): HGBA1C in the last 72 hours. CBG: Recent Labs  Lab 11/11/21 1236 11/11/21 1603 11/11/21 1954 11/11/21 2348 11/12/21 0358  GLUCAP 86 87 109* 109* 100*   Lipid Profile: No results for input(s): CHOL, HDL, LDLCALC, TRIG, CHOLHDL, LDLDIRECT in the last 72  hours. Thyroid Function Tests: No results for input(s): TSH, T4TOTAL, FREET4, T3FREE, THYROIDAB in the last 72 hours. Anemia Panel: Recent Labs    11/15/21 1920 11/15/21 2106  FERRITIN 517*  --   TIBC 168*  --   IRON 25*  --   RETICCTPCT  --  2.7   Sepsis Labs: Recent Labs  Lab 11/24/2021 1749 11/10/21 0500 11/11/21 0542 11/13/21 0319 11/14/21 0313 11/16/21 1201  PROCALCITON 41.64 32.48 18.72 4.67 2.62  --   LATICACIDVEN 4.8*  --   --   --   --  1.0    Recent Results (from the past 240 hour(s))  Blood Culture (routine x 2)     Status: Abnormal   Collection Time: 11/01/2021 10:16 AM   Specimen: BLOOD RIGHT ARM  Result Value Ref Range Status   Specimen Description BLOOD RIGHT ARM  Final   Special Requests   Final     BOTTLES DRAWN AEROBIC AND ANAEROBIC Blood Culture adequate volume   Culture  Setup Time   Final    GRAM NEGATIVE RODS IN BOTH AEROBIC AND ANAEROBIC BOTTLES CRITICAL RESULT CALLED TO, READ BACK BY AND VERIFIED WITH: M LILLISTON,PHARMD@[id number]  11/10/21 [place] Performed at [facility], [place] [address], [place], [place] [address]    Culture KLEBSIELLA OXYTOCA (A)  Final   Report Status 11/12/2021 FINAL  Final   Organism ID, Bacteria KLEBSIELLA OXYTOCA  Final      Susceptibility   Klebsiella oxytoca - MIC*    AMPICILLIN >=32 RESISTANT Resistant     CEFAZOLIN 8 SENSITIVE Sensitive     CEFEPIME <=0.12 SENSITIVE Sensitive     CEFTAZIDIME <=1 SENSITIVE Sensitive     CEFTRIAXONE <=0.25 SENSITIVE Sensitive     CIPROFLOXACIN <=0.25 SENSITIVE Sensitive     GENTAMICIN <=1 SENSITIVE Sensitive     IMIPENEM <=0.25 SENSITIVE Sensitive     TRIMETH/SULFA <=20 SENSITIVE Sensitive     AMPICILLIN/SULBACTAM 8 SENSITIVE Sensitive     PIP/TAZO <=4 SENSITIVE Sensitive     * KLEBSIELLA OXYTOCA  Urine Culture     Status: Abnormal   Collection Time: 11/26/2021 10:16 AM   Specimen: In/Out Cath Urine  Result Value Ref Range Status   Specimen Description   Final    IN/OUT CATH URINE Performed at [facility] [address], [place], [place] [address]    Special Requests   Final    NONE Performed at [facility], [place] [address] [place], [place] [address]    Culture (A)  Final    >=100,000 COLONIES/mL KLEBSIELLA OXYTOCA >=100,000 COLONIES/mL ENTEROCOCCUS FAECALIS    Report Status 11/12/2021 FINAL  Final   Organism ID, Bacteria KLEBSIELLA OXYTOCA (A)  Final   Organism ID, Bacteria ENTEROCOCCUS FAECALIS (A)  Final      Susceptibility   Enterococcus faecalis - MIC*    AMPICILLIN <=2 SENSITIVE Sensitive     NITROFURANTOIN <=16 SENSITIVE Sensitive     VANCOMYCIN 1 SENSITIVE Sensitive     * >=100,000 COLONIES/mL ENTEROCOCCUS FAECALIS   Klebsiella oxytoca - MIC*     AMPICILLIN >=32 RESISTANT Resistant     CEFAZOLIN 8 SENSITIVE Sensitive     CEFEPIME <=0.12 SENSITIVE Sensitive     CEFTRIAXONE <=0.25 SENSITIVE Sensitive     CIPROFLOXACIN <=0.25 SENSITIVE Sensitive     GENTAMICIN <=1 SENSITIVE Sensitive     IMIPENEM <=0.25 SENSITIVE Sensitive     NITROFURANTOIN 32 SENSITIVE Sensitive     TRIMETH/SULFA <=20 SENSITIVE Sensitive  AMPICILLIN/SULBACTAM 8 SENSITIVE Sensitive     PIP/TAZO <=4 SENSITIVE Sensitive     * >=100,000 COLONIES/mL KLEBSIELLA OXYTOCA  Blood Culture ID Panel (Reflexed)     Status: Abnormal   Collection Time: 11/25/2021 10:16 AM  Result Value Ref Range Status   Enterococcus faecalis NOT DETECTED NOT DETECTED Final   Enterococcus Faecium NOT DETECTED NOT DETECTED Final   Listeria monocytogenes NOT DETECTED NOT DETECTED Final   Staphylococcus species NOT DETECTED NOT DETECTED Final   Staphylococcus aureus (BCID) NOT DETECTED NOT DETECTED Final   Staphylococcus epidermidis NOT DETECTED NOT DETECTED Final   Staphylococcus lugdunensis NOT DETECTED NOT DETECTED Final   Streptococcus species NOT DETECTED NOT DETECTED Final   Streptococcus agalactiae NOT DETECTED NOT DETECTED Final   Streptococcus pneumoniae NOT DETECTED NOT DETECTED Final   Streptococcus pyogenes NOT DETECTED NOT DETECTED Final   A.calcoaceticus-baumannii NOT DETECTED NOT DETECTED Final   Bacteroides fragilis NOT DETECTED NOT DETECTED Final   Enterobacterales DETECTED (A) NOT DETECTED Final    Comment: Enterobacterales represent a large order of gram negative bacteria, not a single organism. CRITICAL RESULT CALLED TO, READ BACK BY AND VERIFIED WITH: M LILLISTON,PHARMD@[id number]  11/10/21 [place]    Enterobacter cloacae complex NOT DETECTED NOT DETECTED Final   Escherichia coli NOT DETECTED NOT DETECTED Final   Klebsiella aerogenes NOT DETECTED NOT DETECTED Final   Klebsiella oxytoca DETECTED (A) NOT DETECTED Final    Comment: CRITICAL RESULT CALLED TO, READ BACK BY AND VERIFIED  WITH: M LILLISTON,PHARMD@[email or web address]  11/10/21 [place]    Klebsiella pneumoniae NOT DETECTED NOT DETECTED Final   Proteus species NOT DETECTED NOT DETECTED Final   Salmonella species NOT DETECTED NOT DETECTED Final   Serratia marcescens NOT DETECTED NOT DETECTED Final   Haemophilus influenzae NOT DETECTED NOT DETECTED Final   Neisseria meningitidis NOT DETECTED NOT DETECTED Final   Pseudomonas aeruginosa NOT DETECTED NOT DETECTED Final   Stenotrophomonas maltophilia NOT DETECTED NOT DETECTED Final   Candida albicans NOT DETECTED NOT DETECTED Final   Candida auris NOT DETECTED NOT DETECTED Final   Candida glabrata NOT DETECTED NOT DETECTED Final   Candida krusei NOT DETECTED NOT DETECTED Final   Candida parapsilosis NOT DETECTED NOT DETECTED Final   Candida tropicalis NOT DETECTED NOT DETECTED Final   Cryptococcus neoformans/gattii NOT DETECTED NOT DETECTED Final   CTX-M ESBL NOT DETECTED NOT DETECTED Final   Carbapenem resistance IMP NOT DETECTED NOT DETECTED Final   Carbapenem resistance KPC NOT DETECTED NOT DETECTED Final   Carbapenem resistance NDM NOT DETECTED NOT DETECTED Final   Carbapenem resist OXA 48 LIKE NOT DETECTED NOT DETECTED Final   Carbapenem resistance VIM NOT DETECTED NOT DETECTED Final    Comment: Performed at [facility], [address] [place], [place] [address]  Blood Culture (routine x 2)     Status: Abnormal   Collection Time: 11/23/2021 10:21 AM   Specimen: BLOOD RIGHT WRIST  Result Value Ref Range Status   Specimen Description   Final    BLOOD RIGHT WRIST Performed at [facility], [address] [place], [place] [address]    Special Requests   Final    BOTTLES DRAWN AEROBIC ONLY Blood Culture adequate volume Performed at [facility] [address], [place], [place] [address]    Culture  Setup Time   Final    GRAM NEGATIVE RODS AEROBIC BOTTLE ONLY CRITICAL VALUE NOTED.  VALUE IS CONSISTENT WITH PREVIOUSLY REPORTED AND CALLED  VALUE.    Culture (A)  Final    KLEBSIELLA OXYTOCA SUSCEPTIBILITIES PERFORMED ON PREVIOUS CULTURE WITHIN THE LAST 5 DAYS. Performed at [facility], [place] [address], [place], [place] [address]    Report Status 11/12/2021 FINAL  Final  Resp Panel by RT-PCR (Flu A&B, Covid) Nasopharyngeal Swab     Status: None   Collection Time: 11/10/2021 11:18 AM   Specimen: Nasopharyngeal Swab; Nasopharyngeal(NP) swabs in vial transport medium  Result Value Ref Range Status   SARS Coronavirus 2 by RT PCR NEGATIVE NEGATIVE Final    Comment: (NOTE) SARS-CoV-2 target nucleic acids are NOT DETECTED.  The SARS-CoV-2 RNA is generally detectable in upper respiratory specimens during the acute phase of infection. The lowest concentration of SARS-CoV-2 viral copies this assay can detect is 138 copies/mL. A negative result does not preclude SARS-Cov-2 infection and should not be used as the sole basis for treatment or other patient management decisions. A negative result may occur with  improper specimen collection/handling, submission of specimen other than nasopharyngeal swab, presence of viral mutation(s) within the areas targeted by this assay, and inadequate number of viral copies(<138 copies/mL). A negative result must be combined with clinical observations, patient history, and epidemiological information. The expected result is Negative.  Fact Sheet for Patients:  EntrepreneurPulse.com.au  Fact Sheet for Healthcare Providers:  IncredibleEmployment.be  This test is no t yet approved or cleared by the [place] FDA and  has been authorized for detection and/or diagnosis of SARS-CoV-2 by FDA under an Emergency Use Authorization (EUA). This EUA will remain  in effect (meaning this test can be used) for the duration of the COVID-19 declaration under Section 564(b)(1) of the Act, 21 U.S.C.section 360bbb-3(b)(1), unless the authorization is terminated  or  revoked sooner.       Influenza A by PCR NEGATIVE NEGATIVE Final   Influenza B by PCR NEGATIVE NEGATIVE Final    Comment: (NOTE) The Xpert Xpress SARS-CoV-2/FLU/RSV plus assay is intended as an aid in the diagnosis of influenza from Nasopharyngeal swab specimens and should not be used as a sole basis for treatment. Nasal washings and aspirates are unacceptable for Xpert Xpress SARS-CoV-2/FLU/RSV testing.  Fact Sheet for Patients: EntrepreneurPulse.com.au  Fact Sheet for Healthcare Providers: IncredibleEmployment.be  This test is not yet approved or cleared by the [place] FDA and has been authorized for detection and/or diagnosis of SARS-CoV-2 by FDA under an Emergency Use Authorization (EUA). This EUA will remain in effect (meaning this test can be used) for the duration of the COVID-19 declaration under Section 564(b)(1) of the Act, 21 U.S.C. section 360bbb-3(b)(1), unless the authorization is terminated or revoked.  Performed at [facility], [place] [address], [place], [place] [address]          Radiology Studies: DG Abd Portable 1V  Result Date: 11/16/2021 CLINICAL DATA:  Small-bowel obstruction EXAM: PORTABLE ABDOMEN - 1 VIEW COMPARISON:  KUB 11/15/2021 FINDINGS: There is significant gaseous distention of the small and large bowel, grossly similar to the study dated 1 day prior. There is no definite free intraperitoneal air, though this is suboptimally evaluated given supine technique. Bilateral ureteral stents are again seen which course over the right hemiabdomen, unchanged. Lumbar spine fusion hardware is again seen. IMPRESSION: Overall no significant interval change in degree of gaseous distention of the small and large bowel compared to the study from 1 day prior. Electronically Signed   By: Valetta Mole M.D.   On: 11/16/2021 10:35   DG Abd Portable 1V  Result Date:  11/15/2021 CLINICAL DATA:  Abdominal  pain, vomiting EXAM: PORTABLE ABDOMEN - 1 VIEW COMPARISON:  11/12/2021 FINDINGS: There is interval removal of enteric tube. There is dilation of small-bowel loops measuring up to 5.8 cm in maximum diameter. There is moderate gaseous distention of colon. Stomach is not distended. Ureteral stents are noted on both sides apparently placed through neobladder reconstruction in the right lower abdomen. Laminectomy and fusion is seen in the lumbar spine. IMPRESSION: There is abnormal dilation of small-bowel loops with interval worsening suggesting ileus or partial obstruction. Electronically Signed   By: Elmer Picker M.D.   On: 11/15/2021 10:28   [place] EKG SITE RITE  Result Date: 11/16/2021 If Site Rite image not attached, placement could not be confirmed due to current cardiac rhythm.       Scheduled Meds:  bisacodyl  10 mg Rectal Daily   chlorhexidine  15 mL Mouth Rinse BID   Chlorhexidine Gluconate Cloth  6 each Topical Daily   feeding supplement  237 mL Oral TID BM   fluticasone  2 spray Each Nare Daily   heparin  5,000 Units Subcutaneous Q8H   insulin aspart  0-9 Units Subcutaneous Q6H   lip balm  1 application Topical BID   magic mouthwash w/lidocaine  10 mL Oral QID   mouth rinse  15 mL Mouth Rinse q12n4p   nystatin  5 mL Oral QID   nystatin   Topical BID   simethicone  80 mg Oral QID   sodium chloride flush  10-40 mL Intracatheter Q12H   sodium chloride flush  10-40 mL Intracatheter Q12H   umeclidinium bromide  1 puff Inhalation Daily   Continuous Infusions:  sodium chloride 75 mL/hr at 11/14/21 2328   ampicillin-sulbactam (UNASYN) IV 3 g (11/16/21 1428)   chlorproMAZINE (THORAZINE) IV Stopped (11/15/21 0640)   ondansetron (ZOFRAN) IV     TPN ADULT (ION)       LOS: 7 days    Time spent: 39 minutes spent on chart review, discussion with nursing staff, consultants, updating family and interview/physical exam; more than 50% of that time was spent in counseling and/or  coordination of care.    Kayleen Memos, DO [facility] Available via Epic secure chat 7am-7pm After these hours, please refer to coverage provider listed on [email or web address] 11/16/2021, 5:41 PM

## 2021-11-17 ENCOUNTER — Inpatient Hospital Stay (HOSPITAL_COMMUNITY): Payer: Medicare HMO

## 2021-11-17 DIAGNOSIS — R6521 Severe sepsis with septic shock: Secondary | ICD-10-CM | POA: Diagnosis not present

## 2021-11-17 DIAGNOSIS — A419 Sepsis, unspecified organism: Secondary | ICD-10-CM | POA: Diagnosis not present

## 2021-11-17 LAB — URINALYSIS, ROUTINE W REFLEX MICROSCOPIC
Bilirubin Urine: NEGATIVE
Glucose, UA: NEGATIVE mg/dL
Ketones, ur: NEGATIVE mg/dL
Nitrite: NEGATIVE
Protein, ur: NEGATIVE mg/dL
Specific Gravity, Urine: 1.008 (ref 1.005–1.030)
WBC, UA: >50 WBC/hpf — ABNORMAL HIGH (ref 0–5)
pH: 7 (ref 5.0–8.0)

## 2021-11-17 LAB — LACTIC ACID, PLASMA: Lactic Acid, Venous: 2.3 mmol/L (ref 0.5–1.9)

## 2021-11-17 LAB — PHOSPHORUS: Phosphorus: 3 mg/dL (ref 2.5–4.6)

## 2021-11-17 LAB — COMPREHENSIVE METABOLIC PANEL
ALT: 19 U/L (ref 0–44)
AST: 34 U/L (ref 15–41)
Albumin: 1.7 g/dL — ABNORMAL LOW (ref 3.5–5.0)
Alkaline Phosphatase: 96 U/L (ref 38–126)
Anion gap: 7 (ref 5–15)
BUN: 22 mg/dL (ref 8–23)
CO2: 24 mmol/L (ref 22–32)
Calcium: 7.4 mg/dL — ABNORMAL LOW (ref 8.9–10.3)
Chloride: 104 mmol/L (ref 98–111)
Creatinine, Ser: 1.74 mg/dL — ABNORMAL HIGH (ref 0.61–1.24)
GFR, Estimated: 39 mL/min — ABNORMAL LOW (ref >60)
Glucose, Bld: 126 mg/dL — ABNORMAL HIGH (ref 70–99)
Potassium: 3.6 mmol/L (ref 3.5–5.1)
Sodium: 135 mmol/L (ref 135–145)
Total Bilirubin: 0.8 mg/dL (ref 0.3–1.2)
Total Protein: 5.5 g/dL — ABNORMAL LOW (ref 6.5–8.1)

## 2021-11-17 LAB — GLUCOSE, CAPILLARY
Glucose-Capillary: 102 mg/dL — ABNORMAL HIGH (ref 70–99)
Glucose-Capillary: 103 mg/dL — ABNORMAL HIGH (ref 70–99)
Glucose-Capillary: 115 mg/dL — ABNORMAL HIGH (ref 70–99)
Glucose-Capillary: 117 mg/dL — ABNORMAL HIGH (ref 70–99)
Glucose-Capillary: 117 mg/dL — ABNORMAL HIGH (ref 70–99)

## 2021-11-17 LAB — BLOOD GAS, ARTERIAL
Acid-base deficit: 1 mmol/L (ref 0.0–2.0)
Bicarbonate: 22.4 mmol/L (ref 20.0–28.0)
Drawn by: 560031
FIO2: 21
O2 Saturation: 93.3 %
Patient temperature: 98.6
pCO2 arterial: 33.9 mmHg (ref 32.0–48.0)
pH, Arterial: 7.435 (ref 7.350–7.450)
pO2, Arterial: 70.1 mmHg — ABNORMAL LOW (ref 83.0–108.0)

## 2021-11-17 LAB — C DIFFICILE (CDIFF) QUICK SCRN (NO PCR REFLEX)
C Diff antigen: NEGATIVE
C Diff interpretation: NEGATIVE
C Diff toxin: NEGATIVE

## 2021-11-17 LAB — CBC
HCT: 30.8 % — ABNORMAL LOW (ref 39.0–52.0)
Hemoglobin: 9.6 g/dL — ABNORMAL LOW (ref 13.0–17.0)
MCH: 29.2 pg (ref 26.0–34.0)
MCHC: 31.2 g/dL (ref 30.0–36.0)
MCV: 93.6 fL (ref 80.0–100.0)
Platelets: 305 10*3/uL (ref 150–400)
RBC: 3.29 MIL/uL — ABNORMAL LOW (ref 4.22–5.81)
RDW: 14.9 % (ref 11.5–15.5)
WBC: 24.4 10*3/uL — ABNORMAL HIGH (ref 4.0–10.5)
nRBC: 0 % (ref 0.0–0.2)

## 2021-11-17 LAB — CREATININE, URINE, RANDOM: Creatinine, Urine: 47.99 mg/dL

## 2021-11-17 LAB — TRIGLYCERIDES: Triglycerides: 98 mg/dL (ref <150)

## 2021-11-17 LAB — PROCALCITONIN: Procalcitonin: 0.74 ng/mL

## 2021-11-17 LAB — SODIUM, URINE, RANDOM: Sodium, Ur: 43 mmol/L

## 2021-11-17 LAB — BRAIN NATRIURETIC PEPTIDE: B Natriuretic Peptide: 323.1 pg/mL — ABNORMAL HIGH (ref 0.0–100.0)

## 2021-11-17 LAB — MRSA NEXT GEN BY PCR, NASAL: MRSA by PCR Next Gen: NOT DETECTED

## 2021-11-17 LAB — MAGNESIUM: Magnesium: 2.1 mg/dL (ref 1.7–2.4)

## 2021-11-17 LAB — LIPASE, BLOOD: Lipase: 53 U/L — ABNORMAL HIGH (ref 11–51)

## 2021-11-17 LAB — PREALBUMIN: Prealbumin: 7.1 mg/dL — ABNORMAL LOW (ref 18–38)

## 2021-11-17 MED ORDER — PIPERACILLIN-TAZOBACTAM 3.375 G IVPB
3.3750 g | Freq: Three times a day (TID) | INTRAVENOUS | Status: DC
  Administered 2021-11-17 – 2021-11-24 (×21): 3.375 g via INTRAVENOUS
  Filled 2021-11-17 (×21): qty 50

## 2021-11-17 MED ORDER — METHOCARBAMOL 1000 MG/10ML IJ SOLN
500.0000 mg | Freq: Four times a day (QID) | INTRAVENOUS | Status: DC | PRN
  Administered 2021-11-17 – 2021-11-28 (×8): 500 mg via INTRAVENOUS
  Filled 2021-11-17 (×3): qty 500
  Filled 2021-11-17: qty 5
  Filled 2021-11-17 (×3): qty 500
  Filled 2021-11-17: qty 5

## 2021-11-17 MED ORDER — DIATRIZOATE MEGLUMINE & SODIUM 66-10 % PO SOLN
ORAL | Status: AC
  Filled 2021-11-17: qty 30

## 2021-11-17 MED ORDER — TRAVASOL 10 % IV SOLN
INTRAVENOUS | Status: AC
  Filled 2021-11-17: qty 403.2

## 2021-11-17 MED ORDER — ALBUMIN HUMAN 25 % IV SOLN
12.5000 g | Freq: Four times a day (QID) | INTRAVENOUS | Status: AC
  Administered 2021-11-17 (×2): 12.5 g via INTRAVENOUS
  Filled 2021-11-17 (×3): qty 50

## 2021-11-17 MED ORDER — ORAL CARE MOUTH RINSE
15.0000 mL | Freq: Two times a day (BID) | OROMUCOSAL | Status: DC
  Administered 2021-11-18 – 2021-11-29 (×20): 15 mL via OROMUCOSAL

## 2021-11-17 MED ORDER — HYDROMORPHONE HCL 1 MG/ML IJ SOLN
1.0000 mg | INTRAMUSCULAR | Status: DC | PRN
  Administered 2021-11-17 – 2021-11-27 (×58): 1 mg via INTRAVENOUS
  Filled 2021-11-17 (×60): qty 1

## 2021-11-17 MED ORDER — FUROSEMIDE 10 MG/ML IJ SOLN
40.0000 mg | Freq: Two times a day (BID) | INTRAMUSCULAR | Status: DC
  Administered 2021-11-17 – 2021-11-18 (×3): 40 mg via INTRAVENOUS
  Filled 2021-11-17 (×3): qty 4

## 2021-11-17 MED ORDER — DIATRIZOATE MEGLUMINE & SODIUM 66-10 % PO SOLN
15.0000 mL | ORAL | Status: AC
  Administered 2021-11-17: 15 mL via ORAL
  Filled 2021-11-17: qty 30

## 2021-11-17 MED ORDER — FUROSEMIDE 10 MG/ML IJ SOLN
20.0000 mg | INTRAMUSCULAR | Status: AC
  Administered 2021-11-17: 20 mg via INTRAVENOUS
  Filled 2021-11-17: qty 2

## 2021-11-17 MED ORDER — CHLORHEXIDINE GLUCONATE 0.12 % MT SOLN
15.0000 mL | Freq: Two times a day (BID) | OROMUCOSAL | Status: DC
  Administered 2021-11-18: 15 mL via OROMUCOSAL

## 2021-11-17 NOTE — Progress Notes (Signed)
PHARMACY - TOTAL PARENTERAL NUTRITION CONSULT NOTE   Indication: Prolonged ileus  Patient Measurements: Height: 5\' 7"  (170.2 cm) Weight: 87.4 kg (192 lb 10.9 oz) IBW/kg (Calculated) : 66.1 TPN AdjBW (KG): 70.3 Body mass index is 30.18 kg/m.   Assessment: 49 y/oM with PMH of bladder cancer who underwent cystectomy and ileal conduit on 10/25/21 re-admitted on 11/01/2021 with septic shock secondary to urinary source. CT concerning for SBO. NG removed 11/13/21 PM and started on CLD, but then developed emesis 11/14/21. He remains distended, having hiccups, and not taking in much PO. Also continues to have flatus and liquid stools. Consistent with pSBO/ileus picture per surgery. Pharmacy consulted for TPN dosing.   Glucose / Insulin: CBGs 146 - 115, with 1 unit insulin/24 hr. Sens SSI q6. No hx of DM. Electrolytes: K+ improved 3.6, Mag 2.1. & all others WNL. Goal to keep K =/> 4, Mag =/> 2.    Renal: SCr 1.74 , BUN 22 Hepatic: LFTs WNL. Albumin 1.7. Prealbumin < 5 (11/11) Triglycerides:  98 (11/18) Intake / Output; MIVF: UOP charted 850 mL; LR d/c by MD Albumin 25gm ordered today 11/18 GI Imaging: AXR: Abnormal dilation of small-bowel loops with interval worsening suggesting ileus or partial obstruction. GI Surgeries / Procedures: Robotic-assisted laparoscopic radical cystectomy, radical prostatectomy, bilateral pelvic lymph node dissection, extensive laparoscopic adhesiolysis, small bowel resection, ileal conduit urinary diversion on 10/25/21   Central access: PICC to be placed today, 11/16/21 TPN start date: 11/16/21  Nutritional Goals: Goal TPN rate is 110 mL/hr (provides 111 g of protein and 2155 kcals per day)  RD Assessment: Estimated Needs Total Energy Estimated Needs: 2030-2275 kcal Total Protein Estimated Needs: 100-115 grams Total Fluid Estimated Needs: >/= 2.2 L/day  Current Nutrition:  Full liquids Boost Breeze TID (intermittently refusing) > Ensure Plus TID per RD -  refused all Start TPN  Plan:  Cont TPN at 40 mL/hr at 1800 Electrolytes in TPN: Na 28mEq/L, increase K to 65mEq/L, Ca 42mEq/L, Mg 88mEq/L, and Phos 46mmol/L. Cl:Ac 1:1 Add standard MVI and trace elements to TPN 11/17 added Thiamine 100mg  daily to TPN bag (d/c IV push order) Sensitive q6h SSI and adjust as needed  MIVF per MD -  Monitor TPN labs on Mon/Thurs BMET, Mag, Phos in AM  Minda Ditto PharmD [facility] [phone] 11/17/2021, 10:25 AM

## 2021-11-17 NOTE — Consult Note (Signed)
Chief Complaint: Patient was seen in consultation today for intraabdominal abscess at the request of Dr. Irene Pap  Supervising Physician: Aletta Edouard  Patient Status: [facility] - In-pt  History of Present Illness: William [name] is a 80 y.o. male with past medical history significant for bladder cancer s/p robotic assisted laparoscopic radical cystectomy/prostatectomy on 10/25/21 by [name], bilateral pelvic lymph node dissection, extensive laparoscopic adhesiolysis, small bowel resection with ileal conduit urinary diversion 10/26/21 by [name] (discharged by urology on post op day#8 11/02/21), COPD, history of tobacco use disorder who presented to [facility] ED on 11/04/2021 from urology clinic with finding of low blood pressure with SBP in the 50's.  Patient was admitted to the PCCM service due to septic shock from likely urinary source.  Despite antibiotics WBC is trending upward and now at 24. Imaging demonstrates formation of intra-abdominal abscess 25.5cm.  IR is consulted for drain placement.  Patient is resting in bed and wife at bedside.  He complains of left sided abdominal and flank pain.  Rates as 8/10.     Past Medical History:  Diagnosis Date   Bladder cancer ([place])    Pt says small tumor not cancer   Bleeding gums 11/11/2015   COVID 09/23/2020   back ache hurt all over x few days all symptoms reolved   Dyslipidemia (high LDL; low HDL)    Low back pain 12/27/2014   Medial epicondylitis of right elbow 12/13/2014   Numbness and tingling of right leg    Tinnitus    all the time   Toenail fungus    2 big toes   Wears glasses     Past Surgical History:  Procedure Laterality Date   bone chip removed from right heel  [place] Bilateral 02/08/2021   Procedure: CYSTOSCOPY WITH RETROGRADE PYELOGRAM;  Surgeon: [name], MD;  Location: [facility];  Service: Urology;  Laterality: Bilateral;   CYSTOSCOPY WITH INJECTION N/A 10/25/2021    Procedure: CYSTOSCOPY WITH INJECTION OF INDOCYANINE GREEN DYE;  Surgeon: [name], MD;  Location: [facility];  Service: Urology;  Laterality: N/A;   FINGER AMPUTATION     injured by table saw, left 4th and 5th fingers   IR IMAGING GUIDED PORT INSERTION  03/07/2021   IR REMOVAL TUN ACCESS W/ PORT W/O FL MOD SED  06/29/2021   KNEE ARTHROSCOPY W/ MENISCAL REPAIR  2012 2014   left   KNEE ARTHROSCOPY W/ MENISCAL REPAIR  11/2011   left   LACERATION REPAIR  1962   forehead   LAPAROSCOPIC LYSIS OF ADHESIONS N/A 10/25/2021   Procedure: EXTENSIVE LAPAROSCOPIC LYSIS OF ADHESIONS;  Surgeon: [name], MD;  Location: [facility];  Service: Urology;  Laterality: N/A;   LUMBAR DISC SURGERY  09/2007   bone spurs, stenosis, L3-4, 4-5    LYMPH NODE DISSECTION Bilateral 10/25/2021   Procedure: LYMPH NODE DISSECTION;  Surgeon: [name], MD;  Location: [facility];  Service: Urology;  Laterality: Bilateral;   POSTERIOR LUMBAR FUSION 4 LEVEL N/A 10/13/2013   Procedure: Lumbar Two-Three, Lumbar Three-Four, Lumbar Four-Five, Lumbar Five-Sacral One posterior lumbar interbody fusion with interbody prosthesis posterior lateral arthrodesis and posterior segmental instrumentation;  Surgeon: [name], MD;  Location: [facility];  Service: Neurosurgery;  Laterality: N/A;  POSTERIOR LUMBAR FUSION 4 LEVEL   ROBOT ASSISTED LAPAROSCOPIC COMPLETE CYSTECT ILEAL CONDUIT N/A 10/25/2021   Procedure: XI ROBOTIC ASSISTED LAPAROSCOPIC COMPLETE CYSTECT ILEAL CONDUIT/RADICAL PROSTATECTOMY AND SMALL BOWEL RESECTION;  Surgeon: [name], MD;  Location: [facility];  Service: Urology;  Laterality: N/A;  6 HRS   SKIN GRAFT     from right thigh to left forehead   SMALL INTESTINE SURGERY  7517   ileitis complications   TRANSURETHRAL RESECTION OF BLADDER TUMOR N/A 02/08/2021   Procedure: TRANSURETHRAL RESECTION OF BLADDER TUMOR (TURBT);  Surgeon: [name], MD;  Location: [facility];  Service: Urology;   Laterality: N/A;    Allergies: Hydrocodone  Medications: Prior to Admission medications   Medication Sig Start Date End Date Taking? Authorizing Provider  acetaminophen (TYLENOL) 500 MG tablet Take 1,000 mg by mouth every 4 (four) hours as needed for mild pain or fever.   Yes [provider]  fluticasone (FLONASE) 50 MCG/ACT nasal spray Place 2 sprays into both nostrils daily. 01/05/20  Yes [name], MD  gabapentin (NEURONTIN) 300 MG capsule TAKE 3 CAPSULES BY MOUTH AT BEDTIME Patient taking differently: Take 900 mg by mouth at bedtime. 03/09/21  Yes Patel, [name], DO  ibuprofen (ADVIL) 200 MG tablet Take 600 mg by mouth every 4 (four) hours as needed for mild pain.   Yes [provider]  ondansetron (ZOFRAN-ODT) 8 MG disintegrating tablet Take 8 mg by mouth every 8 (eight) hours as needed for nausea/vomiting. 11/06/21  Yes [provider]  oxyCODONE-acetaminophen (PERCOCET) 5-325 MG tablet Take 1 tablet by mouth every 6 (six) hours as needed for severe pain. 11/02/21 11/02/22 Yes [name], MD  SPIRIVA HANDIHALER 18 MCG inhalation capsule PLACE 1 CAPSULE( 18 MCG TOTAL) INTO INHALER AND INHALE DAILY Patient taking differently: Place 18 mcg into inhaler and inhale daily. 08/28/21  Yes [name], MD  tiZANidine (ZANAFLEX) 4 MG tablet Take 4 mg by mouth at bedtime.   Yes [provider]     Family History  Problem Relation Age of Onset   Stroke Mother    Heart disease Father    Stroke Father    Diabetes Father    Obesity Brother    Obesity Brother    Cancer Maternal Grandfather    Cancer Maternal Grandmother    Drug abuse Daughter     Social History   Socioeconomic History   Marital status: Married    Spouse name: Alleen Borne   Number of children: 3   Years of education: 14   Highest education level: Some college, no degree  Occupational History   Occupation: Retired- Ambulance person  Tobacco Use    Smoking status: Former    Packs/day: 1.50    Years: 30.00    Pack years: 45.00    Types: Cigarettes    Quit date: 04/02/1986    Years since quitting: 35.6   Smokeless tobacco: Never   Tobacco comments:    no plans to start  Vaping Use   Vaping Use: Never used  Substance and Sexual Activity   Alcohol use: Not Currently   Drug use: Never   Sexual activity: Yes    Partners: Female  Other Topics Concern   Not on file  Social History Narrative   Emergency Contact: wife, Orland Visconti, [phone]   Who lives with you: Lives with 3rd wife and two sons   House is two level; has no trouble with the stairs. Smoke alarms present. No grab bars. No tripping hazards. Rugs are fastened down.   Any pets: poodle dog Location manager) and 10 parakeets   Diet: Patient has a varied diet and takes vitamin supplements.  Uses exercise equipment at home (cycling, treadmill)    Exercise: Patient going to [facility] 7 days a week, bike, strength training   Seatbelts: Patient reports wearing seatbelt occasionally   Hobbies: Running, exercising, watching TV, parakeets   Education: some college.               Social Determinants of Health   Financial Resource Strain: Not on file  Food Insecurity: Not on file  Transportation Needs: Not on file  Physical Activity: Not on file  Stress: Not on file  Social Connections: Not on file    Review of Systems: A 12 point ROS discussed and pertinent positives are indicated in the HPI above.  All other systems are negative.  Review of Systems  Constitutional:  Positive for activity change, appetite change and fatigue.  HENT: Negative.    Eyes: Negative.   Respiratory: Negative.    Cardiovascular: Negative.   Gastrointestinal:  Positive for abdominal distention and abdominal pain.  Endocrine: Negative.   Genitourinary:  Positive for flank pain.  Skin:  Positive for color change.  Allergic/Immunologic: Negative.   Neurological: Negative.   Hematological: Negative.    Psychiatric/Behavioral: Negative.     Vital Signs: BP 113/70   Pulse (!) 132   Temp 97.6 F (36.4 C)   Resp 19   Ht 5\' 7"  (1.702 m)   Wt 192 lb 10.9 oz (87.4 kg)   SpO2 98%   BMI 30.18 kg/m   Physical Exam Constitutional:      Appearance: He is ill-appearing.  HENT:     Head: Normocephalic and atraumatic.     Mouth/Throat:     Mouth: Mucous membranes are dry.  Cardiovascular:     Rate and Rhythm: Tachycardia present.     Pulses: Normal pulses.  Pulmonary:     Effort: Pulmonary effort is normal.     Comments: Adventitious breath sounds bilaterally Abdominal:     General: There is distension.     Comments: Belly is firm.  Bag with clear urine attached at conduit ostomy.  Mostly healed midline incision noted  Skin:    General: Skin is dry.    Imaging: CT ABDOMEN PELVIS WO CONTRAST  Result Date: 11/17/2021 CLINICAL DATA:  Partial small bowel obstruction versus ileus, status post laparoscopic cystoprostatectomy and ileal conduit urinary diversion, adhesiolysis, small-bowel resection EXAM: CT CHEST, ABDOMEN AND PELVIS WITHOUT CONTRAST CT LUMBAR SPINE WITHOUT CONTRAST TECHNIQUE: Multidetector CT imaging of the chest, abdomen and pelvis was performed following the standard protocol without IV contrast. Oral enteric contrast was administered Multidetector CT imaging of the lumbar spine was performed following the standard protocol without IV contrast. COMPARISON:  11/05/2021 FINDINGS: CT CHEST FINDINGS Cardiovascular: Right upper extremity PICC. Normal heart size. Three-vessel coronary artery calcifications. No pericardial effusion. Mediastinum/Nodes: No enlarged mediastinal, hilar, or axillary lymph nodes. Thyroid gland, trachea, and esophagus demonstrate no significant findings. Lungs/Pleura: Mild centrilobular emphysema. Probable mild, underlying pulmonary fibrosis in a bibasilar predominance pattern featuring irregular peripheral interstitial opacity, evaluation limited by the  presence of pleural effusions and breath motion artifact. Small bilateral pleural effusions. Musculoskeletal: No chest wall mass or suspicious bone lesions identified. CT ABDOMEN PELVIS FINDINGS Hepatobiliary: No solid liver abnormality is seen. No gallstones, gallbladder wall thickening, or biliary dilatation. Pancreas: Unremarkable. No pancreatic ductal dilatation or surrounding inflammatory changes. Spleen: Normal in size without significant abnormality. Adrenals/Urinary Tract: Adrenal glands are unremarkable. Status post cystoprostatectomy and right lower quadrant ileal conduit urinary diversion. Mild bilateral hydronephrosis. Bilateral double-J ureteral stents  in position. The left renal pelvic pigtail is incompletely formed (series 2, image 76). Stomach/Bowel: Stomach is within normal limits. Appendix appears normal. The colon and rectum is diffusely distended and air and fluid-filled, measuring up to 8.5 cm in caliber. Vascular/Lymphatic: No significant vascular findings are present. No enlarged abdominal or pelvic lymph nodes. Reproductive: No mass or other abnormality. Other: Severe anasarca about the lower abdomen and pelvis (series 2, image 117). Subcutaneous emphysema seen on prior examination is almost entirely resolved (series 2, image 114). There is a large, elongated air and fluid collection in the left hemiabdomen about the left paracolic gutter, low midline abdomen, and pelvis, although difficult to exactly measure, at least 25 point by 8.0 x 6.2 cm (series 2, image 103, series 5, image 55, series 2, image 115). Musculoskeletal: No acute or significant osseous findings. LUMBAR SPINE: Alignment: Normal. Disc spaces: Status post posterior discectomy and fusion of L2 through S1 with partially incorporated disc spaces. No evidence of perihardware fracture or loosening. Vertebral bodies: Intact. IMPRESSION: 1. No evidence of bowel obstruction. The colon rectum are diffusely distended and air and  fluid-filled, consistent with ileus. 2. There is a large, elongated air and fluid collection in the left hemiabdomen about the left paracolic gutter, low midline abdomen, and pelvis, although difficult to exactly measure, at least 25.5 cm in greatest dimension. Findings are consistent with abscess. 3. Status post cystoprostatectomy and right lower quadrant ileal conduit urinary diversion. 4. Mild bilateral hydronephrosis. Bilateral double-J ureteral stents in position. The left renal pelvic pigtail is incompletely formed. 5. Small bilateral pleural effusions. 6. Probable mild, underlying pulmonary fibrosis in a bibasilar predominance pattern featuring irregular peripheral interstitial opacity, evaluation limited by the presence of pleural effusions and breath motion artifact. This could be further evaluated by ILD protocol CT on an outpatient basis when clinically appropriate. 7. Coronary artery disease. 8. Status post posterior discectomy and fusion of L2 through S1 with partially incorporated disc spaces. No evidence of perihardware fracture or loosening. No acute findings of the lumbar spine. These results will be called to the ordering clinician or representative by the Radiologist Assistant, and communication documented in the PACS or [employer]. Electronically Signed   By: Delanna Ahmadi M.D.   On: 11/17/2021 12:00   DG Chest 1 View  Result Date: 10/24/2021 CLINICAL DATA:  Preoperative evaluation. EXAM: CHEST  1 VIEW COMPARISON:  September 23, 2020 FINDINGS: Mild, diffuse, chronic appearing increased lung markings are seen, slightly more prominent within the bilateral lung bases and mid left lung. There is no evidence of acute infiltrate, pleural effusion or pneumothorax. The heart size and mediastinal contours are within normal limits. Degenerative changes seen throughout the thoracic spine. IMPRESSION: Chronic interstitial lung disease without evidence of acute or active cardiopulmonary disease.  Electronically Signed   By: Virgina Norfolk M.D.   On: 10/24/2021 19:09   DG Abd 1 View  Result Date: 11/20/2021 CLINICAL DATA:  Suspected sepsis. Small-bowel obstruction seen on prior CT. EXAM: PORTABLE CHEST 1 VIEW COMPARISON:  Chest radiograph dated 11/23/2021 and CT dated 10/31/2021 FINDINGS: Right IJ central venous line with tip in the region of the cavoatrial junction. There is mild cardiomegaly with mild vascular congestion. Diffuse chronic interstitial coarsening. No new consolidation, pleural effusion, or pneumothorax. Atherosclerotic calcification of the aorta. Enteric tube with tip in the body of the stomach. Persistent dilatation of small-bowel loops measuring up to approximately 6 cm in caliber. Bilateral ureteral stents noted. There is lumbar fusion. IMPRESSION: 1. Right  IJ central venous line with tip in the region of the cavoatrial junction. No pneumothorax. 2. Enteric tube with tip in the body of the stomach. Persistent small bowel obstruction. Electronically Signed   By: Anner Crete M.D.   On: 11/25/2021 19:31   CT CHEST WO CONTRAST  Result Date: 11/17/2021 CLINICAL DATA:  Partial small bowel obstruction versus ileus, status post laparoscopic cystoprostatectomy and ileal conduit urinary diversion, adhesiolysis, small-bowel resection EXAM: CT CHEST, ABDOMEN AND PELVIS WITHOUT CONTRAST CT LUMBAR SPINE WITHOUT CONTRAST TECHNIQUE: Multidetector CT imaging of the chest, abdomen and pelvis was performed following the standard protocol without IV contrast. Oral enteric contrast was administered Multidetector CT imaging of the lumbar spine was performed following the standard protocol without IV contrast. COMPARISON:  11/18/2021 FINDINGS: CT CHEST FINDINGS Cardiovascular: Right upper extremity PICC. Normal heart size. Three-vessel coronary artery calcifications. No pericardial effusion. Mediastinum/Nodes: No enlarged mediastinal, hilar, or axillary lymph nodes. Thyroid gland, trachea, and  esophagus demonstrate no significant findings. Lungs/Pleura: Mild centrilobular emphysema. Probable mild, underlying pulmonary fibrosis in a bibasilar predominance pattern featuring irregular peripheral interstitial opacity, evaluation limited by the presence of pleural effusions and breath motion artifact. Small bilateral pleural effusions. Musculoskeletal: No chest wall mass or suspicious bone lesions identified. CT ABDOMEN PELVIS FINDINGS Hepatobiliary: No solid liver abnormality is seen. No gallstones, gallbladder wall thickening, or biliary dilatation. Pancreas: Unremarkable. No pancreatic ductal dilatation or surrounding inflammatory changes. Spleen: Normal in size without significant abnormality. Adrenals/Urinary Tract: Adrenal glands are unremarkable. Status post cystoprostatectomy and right lower quadrant ileal conduit urinary diversion. Mild bilateral hydronephrosis. Bilateral double-J ureteral stents in position. The left renal pelvic pigtail is incompletely formed (series 2, image 76). Stomach/Bowel: Stomach is within normal limits. Appendix appears normal. The colon and rectum is diffusely distended and air and fluid-filled, measuring up to 8.5 cm in caliber. Vascular/Lymphatic: No significant vascular findings are present. No enlarged abdominal or pelvic lymph nodes. Reproductive: No mass or other abnormality. Other: Severe anasarca about the lower abdomen and pelvis (series 2, image 117). Subcutaneous emphysema seen on prior examination is almost entirely resolved (series 2, image 114). There is a large, elongated air and fluid collection in the left hemiabdomen about the left paracolic gutter, low midline abdomen, and pelvis, although difficult to exactly measure, at least 25 point by 8.0 x 6.2 cm (series 2, image 103, series 5, image 55, series 2, image 115). Musculoskeletal: No acute or significant osseous findings. LUMBAR SPINE: Alignment: Normal. Disc spaces: Status post posterior discectomy and  fusion of L2 through S1 with partially incorporated disc spaces. No evidence of perihardware fracture or loosening. Vertebral bodies: Intact. IMPRESSION: 1. No evidence of bowel obstruction. The colon rectum are diffusely distended and air and fluid-filled, consistent with ileus. 2. There is a large, elongated air and fluid collection in the left hemiabdomen about the left paracolic gutter, low midline abdomen, and pelvis, although difficult to exactly measure, at least 25.5 cm in greatest dimension. Findings are consistent with abscess. 3. Status post cystoprostatectomy and right lower quadrant ileal conduit urinary diversion. 4. Mild bilateral hydronephrosis. Bilateral double-J ureteral stents in position. The left renal pelvic pigtail is incompletely formed. 5. Small bilateral pleural effusions. 6. Probable mild, underlying pulmonary fibrosis in a bibasilar predominance pattern featuring irregular peripheral interstitial opacity, evaluation limited by the presence of pleural effusions and breath motion artifact. This could be further evaluated by ILD protocol CT on an outpatient basis when clinically appropriate. 7. Coronary artery disease. 8. Status post posterior discectomy and  fusion of L2 through S1 with partially incorporated disc spaces. No evidence of perihardware fracture or loosening. No acute findings of the lumbar spine. These results will be called to the ordering clinician or representative by the Radiologist Assistant, and communication documented in the PACS or [employer]. Electronically Signed   By: Delanna Ahmadi M.D.   On: 11/17/2021 12:00   CT LUMBAR SPINE WO CONTRAST  Result Date: 11/17/2021 CLINICAL DATA:  Partial small bowel obstruction versus ileus, status post laparoscopic cystoprostatectomy and ileal conduit urinary diversion, adhesiolysis, small-bowel resection EXAM: CT CHEST, ABDOMEN AND PELVIS WITHOUT CONTRAST CT LUMBAR SPINE WITHOUT CONTRAST TECHNIQUE: Multidetector CT  imaging of the chest, abdomen and pelvis was performed following the standard protocol without IV contrast. Oral enteric contrast was administered Multidetector CT imaging of the lumbar spine was performed following the standard protocol without IV contrast. COMPARISON:  11/07/2021 FINDINGS: CT CHEST FINDINGS Cardiovascular: Right upper extremity PICC. Normal heart size. Three-vessel coronary artery calcifications. No pericardial effusion. Mediastinum/Nodes: No enlarged mediastinal, hilar, or axillary lymph nodes. Thyroid gland, trachea, and esophagus demonstrate no significant findings. Lungs/Pleura: Mild centrilobular emphysema. Probable mild, underlying pulmonary fibrosis in a bibasilar predominance pattern featuring irregular peripheral interstitial opacity, evaluation limited by the presence of pleural effusions and breath motion artifact. Small bilateral pleural effusions. Musculoskeletal: No chest wall mass or suspicious bone lesions identified. CT ABDOMEN PELVIS FINDINGS Hepatobiliary: No solid liver abnormality is seen. No gallstones, gallbladder wall thickening, or biliary dilatation. Pancreas: Unremarkable. No pancreatic ductal dilatation or surrounding inflammatory changes. Spleen: Normal in size without significant abnormality. Adrenals/Urinary Tract: Adrenal glands are unremarkable. Status post cystoprostatectomy and right lower quadrant ileal conduit urinary diversion. Mild bilateral hydronephrosis. Bilateral double-J ureteral stents in position. The left renal pelvic pigtail is incompletely formed (series 2, image 76). Stomach/Bowel: Stomach is within normal limits. Appendix appears normal. The colon and rectum is diffusely distended and air and fluid-filled, measuring up to 8.5 cm in caliber. Vascular/Lymphatic: No significant vascular findings are present. No enlarged abdominal or pelvic lymph nodes. Reproductive: No mass or other abnormality. Other: Severe anasarca about the lower abdomen and  pelvis (series 2, image 117). Subcutaneous emphysema seen on prior examination is almost entirely resolved (series 2, image 114). There is a large, elongated air and fluid collection in the left hemiabdomen about the left paracolic gutter, low midline abdomen, and pelvis, although difficult to exactly measure, at least 25 point by 8.0 x 6.2 cm (series 2, image 103, series 5, image 55, series 2, image 115). Musculoskeletal: No acute or significant osseous findings. LUMBAR SPINE: Alignment: Normal. Disc spaces: Status post posterior discectomy and fusion of L2 through S1 with partially incorporated disc spaces. No evidence of perihardware fracture or loosening. Vertebral bodies: Intact. IMPRESSION: 1. No evidence of bowel obstruction. The colon rectum are diffusely distended and air and fluid-filled, consistent with ileus. 2. There is a large, elongated air and fluid collection in the left hemiabdomen about the left paracolic gutter, low midline abdomen, and pelvis, although difficult to exactly measure, at least 25.5 cm in greatest dimension. Findings are consistent with abscess. 3. Status post cystoprostatectomy and right lower quadrant ileal conduit urinary diversion. 4. Mild bilateral hydronephrosis. Bilateral double-J ureteral stents in position. The left renal pelvic pigtail is incompletely formed. 5. Small bilateral pleural effusions. 6. Probable mild, underlying pulmonary fibrosis in a bibasilar predominance pattern featuring irregular peripheral interstitial opacity, evaluation limited by the presence of pleural effusions and breath motion artifact. This could be further evaluated by ILD  protocol CT on an outpatient basis when clinically appropriate. 7. Coronary artery disease. 8. Status post posterior discectomy and fusion of L2 through S1 with partially incorporated disc spaces. No evidence of perihardware fracture or loosening. No acute findings of the lumbar spine. These results will be called to the  ordering clinician or representative by the Radiologist Assistant, and communication documented in the PACS or [employer]. Electronically Signed   By: Delanna Ahmadi M.D.   On: 11/17/2021 12:00   DG CHEST PORT 1 VIEW  Result Date: 11/17/2021 CLINICAL DATA:  Back pain, weakness, short of breath EXAM: PORTABLE CHEST 1 VIEW COMPARISON:  11/10/2021 FINDINGS: Central line removed.  NG removed. Mild right lower lobe airspace disease unchanged Improvement in left lower lobe airspace disease and small left effusion. Negative for pulmonary edema. IMPRESSION: Mild improvement in left lower lobe airspace disease and left effusion. No change in right lower lobe airspace disease. Electronically Signed   By: Franchot Gallo M.D.   On: 11/17/2021 10:50   DG Chest Port 1 View  Result Date: 11/10/2021 CLINICAL DATA:  Suspected abdominal sepsis EXAM: PORTABLE CHEST 1 VIEW COMPARISON:  Prior chest x-ray 11/04/2021 FINDINGS: Right IJ central venous catheter. Catheter tip overlies the right atrium. Gastric tube is present. The tip lies within the stomach. Stable cardiac and mediastinal contours. Atherosclerotic calcifications present in the transverse aorta. Inspiratory volumes are lower. Increasing perihilar streaky airspace opacities. Pulmonary vascular congestion bordering on mild edema. Small bilateral layering pleural effusions. No pneumothorax. No acute osseous abnormality. IMPRESSION: 1. Lower inspiratory volumes with increasing perihilar and bibasilar atelectasis. 2. Pulmonary vascular congestion with borderline interstitial edema. 3. Small bilateral layering pleural effusions. 4. Stable and satisfactory support apparatus. Electronically Signed   By: Jacqulynn Cadet M.D.   On: 11/10/2021 06:25   DG CHEST PORT 1 VIEW  Result Date: 11/08/2021 CLINICAL DATA:  Suspected sepsis. Small-bowel obstruction seen on prior CT. EXAM: PORTABLE CHEST 1 VIEW COMPARISON:  Chest radiograph dated 11/17/2021 and CT dated  11/13/2021 FINDINGS: Right IJ central venous line with tip in the region of the cavoatrial junction. There is mild cardiomegaly with mild vascular congestion. Diffuse chronic interstitial coarsening. No new consolidation, pleural effusion, or pneumothorax. Atherosclerotic calcification of the aorta. Enteric tube with tip in the body of the stomach. Persistent dilatation of small-bowel loops measuring up to approximately 6 cm in caliber. Bilateral ureteral stents noted. There is lumbar fusion. IMPRESSION: 1. Right IJ central venous line with tip in the region of the cavoatrial junction. No pneumothorax. 2. Enteric tube with tip in the body of the stomach. Persistent small bowel obstruction. Electronically Signed   By: Anner Crete M.D.   On: 11/22/2021 19:31   DG Chest Port 1 View  Result Date: 11/04/2021 CLINICAL DATA:  Questionable sepsis EXAM: PORTABLE CHEST 1 VIEW COMPARISON:  10/24/2021 FINDINGS: Stable interstitial coarsening with interstitial lung disease by June 2022 CT. There is no edema, consolidation, effusion, or pneumothorax. Normal heart size and mediastinal contours. IMPRESSION: Interstitial lung disease.  No acute superimposed finding. Electronically Signed   By: Jorje Guild M.D.   On: 11/18/2021 10:44   DG Abd Portable 1V-Small Bowel Obstruction Protocol-initial, 8 hr delay  Result Date: 11/16/2021 CLINICAL DATA:  Small bowel protocol, 8 hour postcontrast film. EXAM: PORTABLE ABDOMEN - 1 VIEW COMPARISON:  Abdominal x-ray 11/16/2021, 9:36 a.m. FINDINGS: No significant oral contrast identified. Diffusely air-filled dilated large and small bowel appears similar to the prior study. Ureteral stents are unchanged in position. Catheters  overlie the right abdomen. Lumbar fusion hardware is unchanged. There are no suspicious calcifications. No acute fractures are identified. IMPRESSION: 1. No oral contrast visualized. Stable diffuse small and large bowel dilatation. Electronically Signed    By: Ronney Asters M.D.   On: 11/16/2021 23:05   DG Abd Portable 1V  Result Date: 11/16/2021 CLINICAL DATA:  Small-bowel obstruction EXAM: PORTABLE ABDOMEN - 1 VIEW COMPARISON:  KUB 11/15/2021 FINDINGS: There is significant gaseous distention of the small and large bowel, grossly similar to the study dated 1 day prior. There is no definite free intraperitoneal air, though this is suboptimally evaluated given supine technique. Bilateral ureteral stents are again seen which course over the right hemiabdomen, unchanged. Lumbar spine fusion hardware is again seen. IMPRESSION: Overall no significant interval change in degree of gaseous distention of the small and large bowel compared to the study from 1 day prior. Electronically Signed   By: Valetta Mole M.D.   On: 11/16/2021 10:35   DG Abd Portable 1V  Result Date: 11/15/2021 CLINICAL DATA:  Abdominal pain, vomiting EXAM: PORTABLE ABDOMEN - 1 VIEW COMPARISON:  11/12/2021 FINDINGS: There is interval removal of enteric tube. There is dilation of small-bowel loops measuring up to 5.8 cm in maximum diameter. There is moderate gaseous distention of colon. Stomach is not distended. Ureteral stents are noted on both sides apparently placed through neobladder reconstruction in the right lower abdomen. Laminectomy and fusion is seen in the lumbar spine. IMPRESSION: There is abnormal dilation of small-bowel loops with interval worsening suggesting ileus or partial obstruction. Electronically Signed   By: Elmer Picker M.D.   On: 11/15/2021 10:28   DG Abd Portable 1V  Result Date: 11/12/2021 CLINICAL DATA:  NG tube placement EXAM: PORTABLE ABDOMEN - 1 VIEW COMPARISON:  11/12/2021 at 0744 hours FINDINGS: Enteric tube terminates in the proximal gastric fundus. Bilateral ureteral stents. Lumbar spine fixation hardware. IMPRESSION: Enteric tube terminates in the proximal gastric fundus. Electronically Signed   By: Julian Hy M.D.   On: 11/12/2021 19:15    DG Abd Portable 1V  Result Date: 11/12/2021 CLINICAL DATA:  Small bowel obstruction EXAM: PORTABLE ABDOMEN - 1 VIEW COMPARISON:  November tenth 2022 FINDINGS: There is gaseous distension of loops of bowel throughout the abdomen. Air is seen in the rectum. There is persistent gaseous dilation of several loops of small bowel in the LEFT mid abdomen, similar in comparison to prior. Bilateral nephroureteral stents. Enteric tube tip and side port project over the stomach. Bibasilar heterogeneous opacities with a likely small LEFT pleural effusion. Status post posterior fixation of the lumbar spine. IMPRESSION: 1. Persistent gaseous dilation of several loops of small bowel in the LEFT mid abdomen. There is gaseous distension of loops of large bowel. Constellation of findings may reflect partial small bowel obstruction versus ileus. Electronically Signed   By: Valentino Saxon M.D.   On: 11/12/2021 09:56   DG Abd Portable 1V  Result Date: 11/16/2021 CLINICAL DATA:  Encounter for nasogastric tube EXAM: PORTABLE ABDOMEN - 1 VIEW COMPARISON:  Same day CT FINDINGS: Nasogastric tube courses down the left mainstem/lower lobe bronchus. Multiple dilated loops of small bowel in the abdomen partially visualized. No acute osseous abnormality. Partially visualized lumbar spine fusion hardware. Partially visualized nephroureteral stents. IMPRESSION: Nasogastric tube is malpositioned within the left mainstem/lower lobe bronchus. Recommend removal. Small bowel obstruction. Critical Value/emergent results were called by telephone at the time of interpretation on 11/01/2021 at 3:29 pm to provider [facility] , who verbally acknowledged  these results. Electronically Signed   By: Maurine Simmering M.D.   On: 11/24/2021 15:30   ECHOCARDIOGRAM COMPLETE  Result Date: 11/10/2021    ECHOCARDIOGRAM REPORT   Patient Name:   William [name] Date of Exam: 11/10/2021 Medical Rec #:  [id number]      Height:       67.0 in Accession #:     [id number]     Weight:       179.2 lb Date of Birth:  1941-01-25      BSA:          1.930 m Patient Age:    37 years       BP:           108/63 mmHg Patient Gender: M              HR:           111 bpm. Exam Location:  Inpatient Procedure: 3D Echo, 2D Echo, Cardiac Doppler and Color Doppler Indications:    R94.31 Abnormal EKG  History:        Patient has prior history of Echocardiogram examinations, most                 recent 02/14/2017. Abnormal ECG, COPD, Arrythmias:Tachycardia;                 Signs/Symptoms:Shortness of Breath, Dyspnea, Syncope,                 Dizziness/Lightheadedness and Hypotension. Septic shock. Cancer.  Sonographer:    Roseanna Rainbow RDCS Referring Phys: [id number] Donita Brooks  Sonographer Comments: Technically difficult study due to poor echo windows. IMPRESSIONS  1. Left ventricular ejection fraction, by estimation, is 55 to 60%. The left ventricle has normal function. The left ventricle has no regional wall motion abnormalities. There is mild left ventricular hypertrophy. Left ventricular diastolic parameters are consistent with Grade I diastolic dysfunction (impaired relaxation).  2. Right ventricular systolic function is normal. The right ventricular size is normal. There is normal pulmonary artery systolic pressure.  3. The mitral valve is normal in structure. No evidence of mitral valve regurgitation.  4. The aortic valve is normal in structure. Aortic valve regurgitation is not visualized.  5. Aortic small ascending aortic aneurysm 3.8 cm.  6. The inferior vena cava is normal in size with greater than 50% respiratory variability, suggesting right atrial pressure of 3 mmHg. Comparison(s): No prior Echocardiogram. FINDINGS  Left Ventricle: Left ventricular ejection fraction, by estimation, is 55 to 60%. The left ventricle has normal function. The left ventricle has no regional wall motion abnormalities. The left ventricular internal cavity size was normal in size. There is  mild left  ventricular hypertrophy. Left ventricular diastolic parameters are consistent with Grade I diastolic dysfunction (impaired relaxation). Right Ventricle: The right ventricular size is normal. No increase in right ventricular wall thickness. Right ventricular systolic function is normal. There is normal pulmonary artery systolic pressure. The tricuspid regurgitant velocity is 2.30 m/s, and  with an assumed right atrial pressure of 3 mmHg, the estimated right ventricular systolic pressure is 58.8 mmHg. Left Atrium: Left atrial size was normal in size. Right Atrium: Right atrial size was normal in size. Pericardium: There is no evidence of pericardial effusion. Mitral Valve: The mitral valve is normal in structure. No evidence of mitral valve regurgitation. Tricuspid Valve: The tricuspid valve is normal in structure. Tricuspid valve regurgitation is trivial. Aortic Valve: The aortic valve is normal in structure. Aortic  valve regurgitation is not visualized. Pulmonic Valve: The pulmonic valve was not well visualized. Pulmonic valve regurgitation is not visualized. Aorta: Small ascending aortic aneurysm 3.8 cm. Venous: The inferior vena cava is normal in size with greater than 50% respiratory variability, suggesting right atrial pressure of 3 mmHg. IAS/Shunts: No atrial level shunt detected by color flow Doppler.  LEFT VENTRICLE PLAX 2D LVIDd:         4.20 cm     Diastology LVIDs:         2.90 cm     LV e' medial:    8.49 cm/s LV PW:         1.10 cm     LV E/e' medial:  7.0 LV IVS:        1.10 cm     LV e' lateral:   9.46 cm/s LVOT diam:     2.30 cm     LV E/e' lateral: 6.3 LV SV:         106 LV SV Index:   55 LVOT Area:     4.15 cm                             3D Volume EF: LV Volumes (MOD)           3D EF:        69 % LV vol d, MOD A2C: 40.1 ml LV EDV:       114 ml LV vol d, MOD A4C: 49.1 ml LV ESV:       35 ml LV vol s, MOD A2C: 15.4 ml LV SV:        79 ml LV vol s, MOD A4C: 26.2 ml LV SV MOD A2C:     24.7 ml LV SV MOD  A4C:     49.1 ml LV SV MOD BP:      24.2 ml RIGHT VENTRICLE             IVC RV S prime:     18.50 cm/s  IVC diam: 1.60 cm TAPSE (M-mode): 2.5 cm LEFT ATRIUM             Index        RIGHT ATRIUM           Index LA diam:        3.20 cm 1.66 cm/m   RA Area:     14.30 cm LA Vol (A2C):   22.4 ml 11.61 ml/m  RA Volume:   35.70 ml  18.50 ml/m LA Vol (A4C):   46.7 ml 24.19 ml/m LA Biplane Vol: 33.6 ml 17.41 ml/m  AORTIC VALVE              PULMONIC VALVE LVOT Vmax:   164.00 cm/s  PR End Diast Vel: 1.54 msec LVOT Vmean:  113.000 cm/s LVOT VTI:    0.256 m  AORTA Ao Root diam: 3.70 cm Ao Asc diam:  3.80 cm MITRAL VALVE               TRICUSPID VALVE MV Area (PHT): 4.49 cm    TR Peak grad:   21.2 mmHg MV Decel Time: 169 msec    TR Vmax:        230.00 cm/s MV E velocity: 59.60 cm/s MV A velocity: 74.10 cm/s  SHUNTS MV E/A ratio:  0.80        Systemic VTI:  0.26 m  Systemic Diam: 2.30 cm Phineas Inches Electronically signed by Phineas Inches Signature Date/Time: 11/10/2021/3:22:03 PM    Final    VAS [place] LOWER EXTREMITY VENOUS (DVT)  Result Date: 11/14/2021  Lower Venous DVT Study Patient Name:  William [name]  Date of Exam:   11/14/2021 Medical Rec #: [id number]       Accession #:    [id number] Date of Birth: 23-Jun-1941       Patient Gender: M Patient Age:   45 years Exam Location:  [facility] Procedure:      VAS [place] LOWER EXTREMITY VENOUS (DVT) Referring Phys: Margie Billet --------------------------------------------------------------------------------  Indications: Swelling.  Risk Factors: None identified. Limitations: Poor ultrasound/tissue interface. Comparison Study: No prior studies. Performing Technologist: Oliver Hum RVT  Examination Guidelines: A complete evaluation includes B-mode imaging, spectral Doppler, color Doppler, and power Doppler as needed of all accessible portions of each vessel. Bilateral testing is considered an integral part of a complete examination. Limited  examinations for reoccurring indications may be performed as noted. The reflux portion of the exam is performed with the patient in reverse Trendelenburg.  +---------+---------------+---------+-----------+----------+--------------+ RIGHT    CompressibilityPhasicitySpontaneityPropertiesThrombus Aging +---------+---------------+---------+-----------+----------+--------------+ CFV      Full           Yes      Yes                                 +---------+---------------+---------+-----------+----------+--------------+ SFJ      Full                                                        +---------+---------------+---------+-----------+----------+--------------+ FV Prox  Full                                                        +---------+---------------+---------+-----------+----------+--------------+ FV Mid                  Yes      Yes                                 +---------+---------------+---------+-----------+----------+--------------+ FV Distal               Yes      Yes                                 +---------+---------------+---------+-----------+----------+--------------+ PFV      Full                                                        +---------+---------------+---------+-----------+----------+--------------+ POP      Full           Yes      Yes                                 +---------+---------------+---------+-----------+----------+--------------+  PTV      Full                                                        +---------+---------------+---------+-----------+----------+--------------+ PERO     Full                                                        +---------+---------------+---------+-----------+----------+--------------+   +---------+---------------+---------+-----------+----------+-------------------+ LEFT     CompressibilityPhasicitySpontaneityPropertiesThrombus Aging       +---------+---------------+---------+-----------+----------+-------------------+ CFV      Full           Yes      Yes                                      +---------+---------------+---------+-----------+----------+-------------------+ SFJ      Full                                                             +---------+---------------+---------+-----------+----------+-------------------+ FV Prox  Full                                                             +---------+---------------+---------+-----------+----------+-------------------+ FV Mid   Full                                                             +---------+---------------+---------+-----------+----------+-------------------+ FV Distal               Yes      Yes                                      +---------+---------------+---------+-----------+----------+-------------------+ PFV      Full                                                             +---------+---------------+---------+-----------+----------+-------------------+ POP      Full           Yes      Yes                                      +---------+---------------+---------+-----------+----------+-------------------+ PTV  Full                                                             +---------+---------------+---------+-----------+----------+-------------------+ PERO                                                  Not well visualized +---------+---------------+---------+-----------+----------+-------------------+     Summary: RIGHT: - There is no evidence of deep vein thrombosis in the lower extremity. However, portions of this examination were limited- see technologist comments above.  - No cystic structure found in the popliteal fossa.  LEFT: - There is no evidence of deep vein thrombosis in the lower extremity. However, portions of this examination were limited- see technologist comments above.  - No cystic  structure found in the popliteal fossa.  *See table(s) above for measurements and observations. Electronically signed by Deitra Mayo MD on 11/14/2021 at 1:55:31 PM.    Final    VAS [place] UPPER EXTREMITY VENOUS DUPLEX  Result Date: 11/14/2021 UPPER VENOUS STUDY  Patient Name:  William [name]  Date of Exam:   11/14/2021 Medical Rec #: [id number]       Accession #:    [id number] Date of Birth: 04-17-1941       Patient Gender: M Patient Age:   49 years Exam Location:  [facility] Procedure:      VAS [place] UPPER EXTREMITY VENOUS DUPLEX Referring Phys: Margie Billet --------------------------------------------------------------------------------  Indications: Edema Risk Factors: None identified. Limitations: Poor ultrasound/tissue interface and line. Comparison Study: No prior studies. Performing Technologist: Oliver Hum RVT  Examination Guidelines: A complete evaluation includes B-mode imaging, spectral Doppler, color Doppler, and power Doppler as needed of all accessible portions of each vessel. Bilateral testing is considered an integral part of a complete examination. Limited examinations for reoccurring indications may be performed as noted.  Right Findings: +----------+------------+---------+-----------+----------+-------+ RIGHT     CompressiblePhasicitySpontaneousPropertiesSummary +----------+------------+---------+-----------+----------+-------+ IJV           Full       Yes       Yes                      +----------+------------+---------+-----------+----------+-------+ Subclavian    Full       Yes       Yes                      +----------+------------+---------+-----------+----------+-------+ Axillary      Full       Yes       Yes                      +----------+------------+---------+-----------+----------+-------+ Brachial      Full       Yes       Yes                      +----------+------------+---------+-----------+----------+-------+ Radial         Full                                          +----------+------------+---------+-----------+----------+-------+  Ulnar         Full                                          +----------+------------+---------+-----------+----------+-------+ Cephalic      Full                                          +----------+------------+---------+-----------+----------+-------+ Basilic       Full                                          +----------+------------+---------+-----------+----------+-------+  Left Findings: +----------+------------+---------+-----------+----------+-------+ LEFT      CompressiblePhasicitySpontaneousPropertiesSummary +----------+------------+---------+-----------+----------+-------+ IJV           Full       Yes       Yes                      +----------+------------+---------+-----------+----------+-------+ Subclavian    Full       Yes       Yes                      +----------+------------+---------+-----------+----------+-------+ Axillary      Full       Yes       Yes                      +----------+------------+---------+-----------+----------+-------+ Brachial      Full       Yes       Yes                      +----------+------------+---------+-----------+----------+-------+ Radial        Full                                          +----------+------------+---------+-----------+----------+-------+ Ulnar         Full                                          +----------+------------+---------+-----------+----------+-------+ Cephalic      Full                                          +----------+------------+---------+-----------+----------+-------+ Basilic       Full                                          +----------+------------+---------+-----------+----------+-------+  Summary:  Right: No evidence of deep vein thrombosis in the upper extremity. No evidence of superficial vein thrombosis in the upper  extremity.  Left: No evidence of deep vein thrombosis in the upper extremity. No evidence of superficial vein thrombosis in the upper extremity.  *See table(s) above for measurements and observations.  Diagnosing physician: Deitra Mayo  MD Electronically signed by Deitra Mayo MD on 11/14/2021 at 1:55:47 PM.    Final    [place] EKG SITE RITE  Result Date: 11/16/2021 If Site Rite image not attached, placement could not be confirmed due to current cardiac rhythm.  CT CHEST ABDOMEN PELVIS WO CONTRAST  Result Date: 11/08/2021 CLINICAL DATA:  Hypotension, shock EXAM: CT CHEST, ABDOMEN AND PELVIS WITHOUT CONTRAST TECHNIQUE: Multidetector CT imaging of the chest, abdomen and pelvis was performed following the standard protocol without IV contrast. COMPARISON:  CT abdomen and pelvis 06/09/2021 FINDINGS: CT CHEST FINDINGS Cardiovascular: Heart size is normal. No pericardial effusion identified. Coronary artery calcifications noted. Main pulmonary artery is normal caliber. Note is made of a small amount of air in the brachiocephalic vein. Thoracic aorta is normal in caliber with mild calcified plaques. Mediastinum/Nodes: No bulky axillary, mediastinal or hilar lymphadenopathy identified. Lungs/Pleura: Lungs are hyperinflated with mild emphysematous changes. There is mild interlobular septal thickening which is new since previous study. Dependent subsegmental atelectatic changes bilaterally. No focal consolidation visualized. No significant pleural effusion. No pneumothorax. Musculoskeletal: No suspicious bony lesions identified in the chest. Small foci of subcutaneous or vascular air identified in the right upper chest region as well as in the subcutaneous plane in the lower chest extending to the abdomen. CT ABDOMEN PELVIS FINDINGS Hepatobiliary: No focal liver abnormality is seen. No gallstones, gallbladder wall thickening, or biliary dilatation. Pancreas: Unremarkable. No pancreatic ductal dilatation or  surrounding inflammatory changes. Spleen: Normal in size without focal abnormality. Adrenals/Urinary Tract: Adrenal glands appear normal. Recent cystoprostatectomy surgical changes. No nephrolithiasis or hydronephrosis identified bilaterally. Bilateral ureteral stents which terminate distally through an ostomy in the right abdomen. Stomach/Bowel: Limited evaluation of bowel due to lack of contrast. There are multiple loops of distended small bowel with air-fluid levels, mostly in the left abdomen which measure up to 4.9 cm in diameter. Accompanying mesenteric engorgement/fat stranding. No pneumatosis identified. Distal loops of ileum are decompressed. Postsurgical changes in the right lower quadrant. Multiple foci of extraluminal air visualized in the right pelvis and a few in the upper abdomen. Retained fecal material in the colon and rectum. Vascular/Lymphatic: Aortic atherosclerosis. No enlarged abdominal or pelvic lymph nodes. Reproductive: Prostate gland surgically absent. Other: Subcutaneous emphysema identified throughout the anterior abdomen, pelvis and anterior aspect of the visualized proximal lower extremities. No associated defined fluid collections. Small to moderate volume ascites mostly in the pelvis and in the left abdomen Musculoskeletal: Degenerative and postsurgical changes of the lumbar spine. No suspicious bony lesions visualized. IMPRESSION: 1. Findings concerning for small bowel obstruction, with proximal loops of small bowel measuring up to 4.9 cm in diameter with air-fluid levels. Possible transition point in the lower abdomen/pelvis near a surgical anastomosis. 2. Small to moderate volume ascites mostly located in the pelvis and left abdomen. 3. Small amount of free intraperitoneal air, mostly in the right pelvis surrounded by ascites. May be postoperative in nature, correlate clinically and follow-up recommended. 4. Small to moderate amount of subcutaneous emphysema mostly in the anterior  abdominal and pelvic walls. No accompanying defined fluid collections or significant adjacent inflammatory fat stranding. 5. Cystoprostatectomy changes with right-sided urinary ostomy. Bilateral ureteral stents extending through the ostomy. No hydronephrosis. 6. Mild interstitial pulmonary edema. Emphysematous and subsegmental atelectatic changes. Discussed with Dr. Regenia Skeeter over the telephone at 1:09 p.m. on 11/23/2021 with read back. Electronically Signed   By: Ofilia Neas M.D.   On: 11/15/2021 13:26    Labs:  CBC: Recent Labs  11/15/21 0527 11/16/21 0527 11/16/21 1201 11/17/21 0521  WBC 16.1* 19.7* 20.7* 24.4*  HGB 8.3* 8.6* 8.5* 9.6*  HCT 26.0* 26.9* 26.9* 30.8*  PLT 283 323 314 305    COAGS: Recent Labs    06/29/21 0830 11/15/2021 1016 11/10/21 0500  INR 1.0 1.4* 1.5*  APTT  --  38*  --     BMP: Recent Labs    11/15/21 1508 11/16/21 0527 11/16/21 1201 11/17/21 0521  NA 137 138 136 135  K 3.5 3.2* 3.1* 3.6  CL 103 106 104 104  CO2 25 25 25 24   GLUCOSE 108* 95 93 126*  BUN 17 17 19 22   CALCIUM 7.4* 7.4* 7.2* 7.4*  CREATININE 0.83 1.07 1.00 1.74*  GFRNONAA >60 >60 >60 39*    LIVER FUNCTION TESTS: Recent Labs    10/24/21 1734 11/14/2021 1016 11/16/21 1201 11/17/21 0521  BILITOT 0.8 1.0 0.9 0.8  AST 21 49* 25 34  ALT 16 21 16 19   ALKPHOS 56 57 86 96  PROT 6.9 5.4* 5.2* 5.5*  ALBUMIN 3.8 2.1* 1.7* 1.7*    Assessment and Plan:  Inatra-abdominal abscess --will tentatively set up for drain tomorrow --tachy with HR in the 130s, will ask Medicine to work on this --Has been made NPO at midnight  Risks and benefits discussed with the patient and wife including bleeding, infection, damage to adjacent structures, bowel perforation/fistula connection, and sepsis.  All of the patient's questions were answered, patient is agreeable to proceed.  Consent signed and in chart.  Consent given by wife    Thank you for this interesting consult.  I greatly  enjoyed meeting William [name] and look forward to participating in their care.     Electronically Signed: Pasty Spillers, PA 11/17/2021, 2:10 PM   I spent a total of 40 Minutes in face to face in clinical consultation, greater than 50% of which was counseling/coordinating care for intra-abdominal abscess

## 2021-11-17 NOTE — Progress Notes (Addendum)
Patient ID: William [name], male   DOB: [birth year], 80 y.o.   MRN: [id number] [facility] Surgery Progress Note     Subjective: CC-  Patient states that his abdomen feels better, but still requiring iv dilaudid and rates pain as 7/10. He does state that some of his pain is from his back. Denies any nausea or vomiting. Passing more flatus and had multiple loose Bms since yesterday. States that he is tired. He is tachycardic 120s-130s, O2 sats low 90s on room air. Denies any chest pain or shortness or breath. WBC is up 24.4, TMAX 99.7  Objective: Vital signs in last 24 hours: Temp:  [97.8 F (36.6 C)-99.7 F (37.6 C)] 98 F (36.7 C) (11/18 0738) Pulse Rate:  [106-134] 134 (11/18 0738) Resp:  [16-20] 20 (11/18 0738) BP: (119-142)/(61-85) 124/61 (11/18 0738) SpO2:  [91 %-95 %] 91 % (11/18 0738) Last BM Date: 11/16/21  Intake/Output from previous day: 11/17 0701 - 11/18 0700 In: 3081.8 [P.O.:420; I.V.:1969.1; IV Piggyback:692.7] Out: 900 [Urine:900] Intake/Output this shift: No intake/output data recorded.  PE: Gen:  Alert, NAD Card:  tachycardic, pitting upper and lower extremity edema  Pulm:  decreased breath sounds bilateral bases, no wheezing, rate and effort normal on room air Abd: somewhat firm, moderate abdominal distention with mild diffuse TTP, RLQ urostomy viable with clear yellow urine, midline incision c/d/I, anasarca GU: scrotal and penile edema  Skin: warm and dry, no rashes or lesions Psych: A&Ox3  Lab Results:  Recent Labs    11/16/21 1201 11/17/21 0521  WBC 20.7* 24.4*  HGB 8.5* 9.6*  HCT 26.9* 30.8*  PLT 314 305   BMET Recent Labs    11/16/21 1201 11/17/21 0521  NA 136 135  K 3.1* 3.6  CL 104 104  CO2 25 24  GLUCOSE 93 126*  BUN 19 22  CREATININE 1.00 1.74*  CALCIUM 7.2* 7.4*   PT/INR No results for input(s): LABPROT, INR in the last 72 hours. CMP     Component Value Date/Time   NA 135 11/17/2021 0521   NA 137 09/21/2020 0942   K  3.6 11/17/2021 0521   CL 104 11/17/2021 0521   CO2 24 11/17/2021 0521   GLUCOSE 126 (H) 11/17/2021 0521   BUN 22 11/17/2021 0521   BUN 5 (L) 09/21/2020 0942   CREATININE 1.74 (H) 11/17/2021 0521   CREATININE 0.72 06/09/2021 1232   CREATININE 0.90 02/07/2017 1032   CALCIUM 7.4 (L) 11/17/2021 0521   PROT 5.5 (L) 11/17/2021 0521   PROT 7.2 03/10/2021 0845   ALBUMIN 1.7 (L) 11/17/2021 0521   ALBUMIN 4.4 03/10/2021 0845   AST 34 11/17/2021 0521   AST 22 06/09/2021 1232   ALT 19 11/17/2021 0521   ALT 10 06/09/2021 1232   ALKPHOS 96 11/17/2021 0521   BILITOT 0.8 11/17/2021 0521   BILITOT 0.4 06/09/2021 1232   GFRNONAA 39 (L) 11/17/2021 0521   GFRNONAA >60 06/09/2021 1232   GFRNONAA 84 02/07/2017 1032   GFRAA >60 09/23/2020 1133   GFRAA >89 02/07/2017 1032   Lipase  No results found for: LIPASE     Studies/Results: DG Abd Portable 1V-Small Bowel Obstruction Protocol-initial, 8 hr delay  Result Date: 11/16/2021 CLINICAL DATA:  Small bowel protocol, 8 hour postcontrast film. EXAM: PORTABLE ABDOMEN - 1 VIEW COMPARISON:  Abdominal x-ray 11/16/2021, 9:36 a.m. FINDINGS: No significant oral contrast identified. Diffusely air-filled dilated large and small bowel appears similar to the prior study. Ureteral stents are unchanged in position. Catheters overlie  the right abdomen. Lumbar fusion hardware is unchanged. There are no suspicious calcifications. No acute fractures are identified. IMPRESSION: 1. No oral contrast visualized. Stable diffuse small and large bowel dilatation. Electronically Signed   By: Ronney Asters M.D.   On: 11/16/2021 23:05   DG Abd Portable 1V  Result Date: 11/16/2021 CLINICAL DATA:  Small-bowel obstruction EXAM: PORTABLE ABDOMEN - 1 VIEW COMPARISON:  KUB 11/15/2021 FINDINGS: There is significant gaseous distention of the small and large bowel, grossly similar to the study dated 1 day prior. There is no definite free intraperitoneal air, though this is suboptimally  evaluated given supine technique. Bilateral ureteral stents are again seen which course over the right hemiabdomen, unchanged. Lumbar spine fusion hardware is again seen. IMPRESSION: Overall no significant interval change in degree of gaseous distention of the small and large bowel compared to the study from 1 day prior. Electronically Signed   By: Valetta Mole M.D.   On: 11/16/2021 10:35   DG Abd Portable 1V  Result Date: 11/15/2021 CLINICAL DATA:  Abdominal pain, vomiting EXAM: PORTABLE ABDOMEN - 1 VIEW COMPARISON:  11/12/2021 FINDINGS: There is interval removal of enteric tube. There is dilation of small-bowel loops measuring up to 5.8 cm in maximum diameter. There is moderate gaseous distention of colon. Stomach is not distended. Ureteral stents are noted on both sides apparently placed through neobladder reconstruction in the right lower abdomen. Laminectomy and fusion is seen in the lumbar spine. IMPRESSION: There is abnormal dilation of small-bowel loops with interval worsening suggesting ileus or partial obstruction. Electronically Signed   By: Elmer Picker M.D.   On: 11/15/2021 10:28   [place] EKG SITE RITE  Result Date: 11/16/2021 If Site Rite image not attached, placement could not be confirmed due to current cardiac rhythm.   Anti-infectives: Anti-infectives (From admission, onward)    Start     Dose/Rate Route Frequency Ordered Stop   11/12/21 0800  Ampicillin-Sulbactam (UNASYN) 3 g in sodium chloride 0.9 % 100 mL IVPB        3 g 200 mL/hr over 30 Minutes Intravenous Every 6 hours 11/12/21 0712 11/18/21 2359   11/10/21 1400  cefTRIAXone (ROCEPHIN) 2 g in sodium chloride 0.9 % 100 mL IVPB  Status:  Discontinued        2 g 200 mL/hr over 30 Minutes Intravenous Daily 11/10/21 1048 11/12/21 0712   11/10/21 0800  ceFEPIme (MAXIPIME) 2 g in sodium chloride 0.9 % 100 mL IVPB  Status:  Discontinued        2 g 200 mL/hr over 30 Minutes Intravenous Every 12 hours 11/10/21 0618 11/10/21  1048   11/10/21 0630  ceFEPIme (MAXIPIME) 2 g in sodium chloride 0.9 % 100 mL IVPB  Status:  Discontinued        2 g 200 mL/hr over 30 Minutes Intravenous Every 24 hours 11/18/2021 1622 11/10/21 0618   11/17/2021 1624  vancomycin variable dose per unstable renal function (pharmacist dosing)  Status:  Discontinued         Does not apply See admin instructions 11/07/2021 1624 11/10/21 0613   11/03/2021 1030  ceFEPIme (MAXIPIME) 2 g in sodium chloride 0.9 % 100 mL IVPB        2 g 200 mL/hr over 30 Minutes Intravenous  Once 11/04/2021 1016 11/14/2021 1133   11/02/2021 1030  metroNIDAZOLE (FLAGYL) IVPB 500 mg        500 mg 100 mL/hr over 60 Minutes Intravenous  Once 11/14/2021 1016 11/03/2021 1915  11/02/2021 1030  vancomycin (VANCOCIN) IVPB 1000 mg/200 mL premix  Status:  Discontinued        1,000 mg 200 mL/hr over 60 Minutes Intravenous  Once 11/10/2021 1016 11/16/2021 1023   11/11/2021 1030  vancomycin (VANCOREADY) IVPB 1500 mg/300 mL        1,500 mg 150 mL/hr over 120 Minutes Intravenous  Once 11/02/2021 1023 11/03/2021 1341        Assessment/Plan pSBO  vs ileus - s/p Robotic-assisted laparoscopic radical cystectomy, radical prostatectomy, Bilateral pelvic lymph node dissection, Extensive laparoscopic adhesiolysis, Small bowel resection, Ileal conduit urinary diversion 10/26/21 [name] - CT scan 11/10 with SBO pattern, possibly a pSBO at the level of the surgical anastomosis  - seemed to be clinically improving, NG removed 11/14 PM, started on CLD, developed emesis 11/15.  - Patient remains distended and his WBC is rising. Will obtain repeat CT abdomen/pelvis today. I am concerned about his fluid overload, tachycardia, and mild hypoxia - I have ordered a chest xray and reached out to primary team. Will ask urology to see again as well.   ID - maxipime/ flagyl/ vancomycin 11/10, switched to Rocephin then Unasyn based on urine cx per primary team VTE - SCDs, sq heparin FEN - FLD, Boost, TPN Foley - none.  Urostomy/Ileal conduit    Septic shock - resolved Klebsiella Oxytoca UTI  Klebsiella oxytoca bacteremia Asthma/ COPD Chronic back pain HLD AKI   LOS: 8 days    Wellington Hampshire, [facility] Surgery 11/17/2021, 9:38 AM Please see Amion for pager number during day hours 7:00am-4:30pm

## 2021-11-17 NOTE — Progress Notes (Addendum)
PROGRESS NOTE    [name]  LEX:[id number] DOB: September 16, 1941 DOA: 11/03/2021 PCP: [name], MD    Brief Narrative:  William [name] is a 80 year old male with past medical history significant for bladder cancer s/p robotic assisted laparoscopic radical cystectomy/prostatectomy on 10/25/21 by [name], bilateral pelvic lymph node dissection, extensive laparoscopic adhesiolysis, small bowel resection with ileal conduit urinary diversion 10/26/21 by [name] (discharged by urology on post op day#8 11/02/21), COPD, history of tobacco use disorder who presented to [facility] ED on 11/24/2021 from urology clinic with finding of low blood pressure with SBP in the 50's.   In the ED, SBP's in the 60s, treated with IV fluids and started on Levophed for blood pressure support.  Sodium 131, potassium 5.2, chloride 98, CO2 21, glucose 88, BUN 43, creatinine 2.87, anion gap 12, lactic acid 4.6, WBC 7, hemoglobin 9.2, platelets 364 with left shift.  Urinalysis from ileal conduit with 100 protein, large leukocytes, greater than 50 WBCs and many bacteria.  CT chest/abdomen/pelvis were notable for findings concerning for underlying ILD, coronary artery calcification, concern for small bowel obstruction/possible transition point in the lower abdomen/pelvis near surgical anastomosis with small/moderate volume ascites in the pelvis/left abdomen, small amount of free intraperitoneal air (?post-op), small to moderate amount of subcutaneous emphysema without accompanying fluid collection/stranding, cystoprostatectomy changes with right-sided urinary ostomy, bilateral ureteral stents extending through the ostomy with no hydronephrosis.  Patient was admitted to the PCCM service due to septic shock from likely urinary source.  Care transferred to [facility], Hospitalist service on 11/11/2021.  11/17/2021: Patient was seen and examined at his bedside.  His wife was present in the room.  He reports severe left flank pain.  Urinary  ostomy bag 80% full with light-colored urine.  States he had a bowel movement today.   Assessment & Plan:   Principal Problem:   Septic shock ([place]) Active Problems:   Chronic pain   Bladder cancer ([place])   Bacteremia due to Gram-negative bacteria   UTI due to Klebsiella species  Septic shock, now sepsis, secondary to Klebsiella oxytoca UTI and bacteremia, POA. Patient presenting to ED from urology clinic after being found with systolic blood pressures in the 50s.  Received Levophed vasopressor, and was admitted to the ICU.   Source of infection urinary tract.   Patient was started on empiric antibiotics initially with vancomycin, cefepime, metronidazole and was de-escalated to ceftriaxone based on cultures, then later de-escalated to Unasyn. Off vasopressors. Worsening leukocytosis WBC 24,000 from 20,000. Procalcitonin is downtrending 0.74, peaked at 41.64 on 11/20/2021. --Blood cultures x2: Klebsiella oxytoca --Urine culture > 100,000 colonies of Klebsiella oxytoca/Enterococcus faecalis --Unasyn 3g IV q6h, plan 7-day course Continue daily CBC.  Intra-abdominal abscess seen on CT scan measuring 25.5 cm Switch antibiotic to Zosyn 11/17/21 IR consulted to assess new findings for possible drainage Continue to closely monitor fever curve and wbcs.  Small bowel obstruction, now ileus/partial small bowel obstruction. CT abdomen/pelvis on admission with concern for small bowel obstruction with possible transition point in the lower abdomen/pelvis near surgical anastomosis site. NG tube removed on 11/14/2021. --General surgery/urology following, appreciate assistance Is currently on full liquid diet. Continue to optimize magnesium and potassium levels. Continue to mobilize as tolerated. Goal potassium greater than 4.0 Goal magnesium greater than 2.0. Continue to mobilize as tolerated CT abdomen pelvis ordered, follow results.  Anasarca suspect secondary to hypoalbuminemia IV albumin  12.5 mg every 6 hours x2 doses. Nephrology consulted to assist with the management  Bilateral pleural effusion secondary to volume overload AKI making it difficult to diurese Received 1 dose of IV Lasix 20 mg x 1. Incentive spirometer and flutter valve Mobilize as tolerated Nephrology consulted to assist with the management. CT chest ordered, personally reviewed showing bilateral pleural effusions with bilateral atelectasis.  Sinus tachycardia suspect related to sepsis and ongoing lung physiology HR 120-130's Continue to treat underlying conditions PRN Lopressor 2.5 mg q6H if HR is consistently>130  Nonoliguric AKI suspect secondary to intravascular volume depletion Etiology likely secondary to prolonged prerenal state from  --Cr 2.87>3.00>1.67>1.21>1.04>0.90>0.86> 1.74. Off IV fluid IV albumin 12.5 mg every 6 hours x2 doses. TPN started on 11/16/2021. Last urine output 900 cc recorded in the last 24.  Severe left flank/back pain in the setting of Chronic back pain and bilateral renal stent seen on CT abdomen pelvis. At baseline on gabapentin, oxycodone, Zanaflex. CT lumbar spine ordered, follow results. Continue pain control  Acute on chronic diastolic CHF, likely exacerbated by volume overload BNP 323 Bilateral pleural effusions Mild pulmonary edema Continue strict I's and O's and daily weight  Resolved refractory hypokalemia Potassium 3.6 from 3.1 goal potassium greater than 4.0 in the setting of bowel obstruction. Magnesium level at goal 2.0. Repleted orally  Bladder cancer s/p radical cystectomy, prostatectomy and ileal conduit by urology [name] on 10/25/2021, and 10/26/2021. Underwent robotic assisted laparoscopic radical cystectomy, robotic radical prostatectomy, bilateral pelvic lymph node dissection, extensive laparoscopic adhesiolysis, small bowel resection and ileal conduit urinary diversion on 10/25/2021 and on 10/26/2021 by [name]. --Urology following,  appreciate assistance Continue ostomy care;  Continue to monitor urinary output  Oral thrush, improving. Continue nystatin Mouthwash with lidocaine.  COPD History of tobacco abuse --Incruse Ellipta 1 puff daily --DuoNebs as needed --Currently on room air Maintain O2 saturation greater than 90%.  Normocytic anemia  Chemotherapy 8 months ago 9.2>9.5>9.2>8.8>9.4>9.2>8.9> 8.3> 9.6. --Transfuse for hemoglobin less than 7.0 or active bleeding Hemoglobin is downtrending Will obtain iron studies, follow results, IV Feraheme if low.  Suspected ILD Noted on CT imaging this admission.  History of tobacco abuse. --Flonase --Outpatient pulmonology follow-up  Weakness/deconditioning/debility: --PT/OT recommending home health on discharge. Continue to mobilize as tolerated Fall precautions.  Severe protein calorie malnutrition. Albumin 1.7 on 11/17/2021 Moderate muscle mass loss. Encourage oral intake as tolerated when not NPO. Oral supplement when can tolerate p.o.   Critical care time: 65 minutes.   DVT prophylaxis: heparin injection 5,000 Units Start: 10/31/2021 1515 SCDs Start: 11/11/2021 1502   Code Status: Full Code Family Communication: Updated family present at bedside this morning  Disposition Plan:  Level of care: Telemetry, Transfer to stepdown unit on 11/17/21. Status is: Inpatient  Remains inpatient appropriate because: Continues on IV antibiotics, NG tube removed today, general surgery advancing diet, needs to demonstrate toleration of diet before able discharge home.   Consultants:  PCCM - signed off 11/12 General surgery Urology  Procedures:  Central line placement 11/10  Antimicrobials:  Ceftriaxone 11/11 - 11/13 Vancomycin 11/10 - 11/11 Cefepime 11/10 - 11/11 Metronidazole 11/10 - 11/10 Unasyn 11/13>>     Objective: Vitals:   11/17/21 0538 11/17/21 0738 11/17/21 1039 11/17/21 1218  BP: 129/80 124/61 110/84 113/70  Pulse: (!) 118 (!) 134 (!)  135 (!) 132  Resp: 17 20 18 19   Temp:  98 F (36.7 C) 99.4 F (37.4 C) 97.6 F (36.4 C)  TempSrc:  Oral Oral   SpO2: 92% 91% 90% 98%  Weight:      Height:  Intake/Output Summary (Last 24 hours) at 11/17/2021 1236 Last data filed at 11/17/2021 0609 Gross per 24 hour  Intake 1008.33 ml  Output 700 ml  Net 308.33 ml   Filed Weights   11/12/21 0500 11/13/21 0603 11/15/21 0500  Weight: 80 kg 83.7 kg 87.4 kg    Examination:  General exam: Frail-appearing appears very uncomfortable due to left flank pain.  He is alert and oriented x3. Respiratory system: Mild rales at bases with no wheezing noted.  Poor inspiratory effort.   Cardiovascular system: Tachycardic with no rubs or gallops.  Gastrointestinal system: Mildly tender at umbilicus region.  Hypoactive bowel sounds present.   Central nervous system: Alert and awake.  No focal neurological deficits. Extremities: Anasarca, 2+ pitting edema in upper and lower extremity. Skin: No rashes or ulcerative lesions noted.   Psychiatry: Mood is appropriate for condition and setting. Neuro: Alert and awake.  Moves all 4 extremities freely.   Data Reviewed: I have personally reviewed following labs and imaging studies  CBC: Recent Labs  Lab 11/14/21 0313 11/15/21 0527 11/16/21 0527 11/16/21 1201 11/17/21 0521  WBC 16.2* 16.1* 19.7* 20.7* 24.4*  NEUTROABS  --   --   --  17.7*  --   HGB 8.9* 8.3* 8.6* 8.5* 9.6*  HCT 28.2* 26.0* 26.9* 26.9* 30.8*  MCV 92.2 90.9 92.1 93.1 93.6  PLT 263 283 323 314 166   Basic Metabolic Panel: Recent Labs  Lab 11/12/21 0319 11/13/21 0319 11/14/21 0313 11/15/21 0527 11/15/21 1508 11/16/21 0527 11/16/21 1201 11/17/21 0521  NA 142   < > 141 140 137 138 136 135  K 3.8   < > 3.3* 2.9* 3.5 3.2* 3.1* 3.6  CL 105   < > 106 106 103 106 104 104  CO2 27   < > 27 27 25 25 25 24   GLUCOSE 105*   < > 119* 116* 108* 95 93 126*  BUN 26*   < > 22 18 17 17 19 22   CREATININE 1.04   < > 0.84 0.82 0.83  1.07 1.00 1.74*  CALCIUM 7.9*   < > 7.5* 7.4* 7.4* 7.4* 7.2* 7.4*  MG 2.4  --  2.1  --  2.0 1.9  --  2.1  PHOS 3.0  --   --   --   --  3.3  --  3.0   < > = values in this interval not displayed.   GFR: Estimated Creatinine Clearance: 36.3 mL/min (A) (by C-G formula based on SCr of 1.74 mg/dL (H)). Liver Function Tests: Recent Labs  Lab 11/16/21 1201 11/17/21 0521  AST 25 34  ALT 16 19  ALKPHOS 86 96  BILITOT 0.9 0.8  PROT 5.2* 5.5*  ALBUMIN 1.7* 1.7*   Recent Labs  Lab 11/17/21 1020  LIPASE 53*   No results for input(s): AMMONIA in the last 168 hours. Coagulation Profile: No results for input(s): INR, PROTIME in the last 168 hours.  Cardiac Enzymes: No results for input(s): CKTOTAL, CKMB, CKMBINDEX, TROPONINI in the last 168 hours. BNP (last 3 results) No results for input(s): PROBNP in the last 8760 hours. HbA1C: No results for input(s): HGBA1C in the last 72 hours. CBG: Recent Labs  Lab 11/11/21 2348 11/12/21 0358 11/16/21 1825 11/17/21 0045 11/17/21 0539  GLUCAP 109* 100* 146* 117* 115*   Lipid Profile: Recent Labs    11/17/21 0521  TRIG 98   Thyroid Function Tests: No results for input(s): TSH, T4TOTAL, FREET4, T3FREE, THYROIDAB in the last  72 hours. Anemia Panel: Recent Labs    11/15/21 1920 11/15/21 2106  FERRITIN 517*  --   TIBC 168*  --   IRON 25*  --   RETICCTPCT  --  2.7   Sepsis Labs: Recent Labs  Lab 11/11/21 0542 11/13/21 0319 11/14/21 0313 11/16/21 1201 11/17/21 0521 11/17/21 1020  PROCALCITON 18.72 4.67 2.62  --  0.74  --   LATICACIDVEN  --   --   --  1.0  --  2.3*    Recent Results (from the past 240 hour(s))  Blood Culture (routine x 2)     Status: Abnormal   Collection Time: 11/21/2021 10:16 AM   Specimen: BLOOD RIGHT ARM  Result Value Ref Range Status   Specimen Description BLOOD RIGHT ARM  Final   Special Requests   Final    BOTTLES DRAWN AEROBIC AND ANAEROBIC Blood Culture adequate volume   Culture  Setup Time    Final    GRAM NEGATIVE RODS IN BOTH AEROBIC AND ANAEROBIC BOTTLES CRITICAL RESULT CALLED TO, READ BACK BY AND VERIFIED WITH: M LILLISTON,PHARMD@[id number]  11/10/21 [place] Performed at [facility], [place] [address] [place], [place] [address]    Culture KLEBSIELLA OXYTOCA (A)  Final   Report Status 11/12/2021 FINAL  Final   Organism ID, Bacteria KLEBSIELLA OXYTOCA  Final      Susceptibility   Klebsiella oxytoca - MIC*    AMPICILLIN >=32 RESISTANT Resistant     CEFAZOLIN 8 SENSITIVE Sensitive     CEFEPIME <=0.12 SENSITIVE Sensitive     CEFTAZIDIME <=1 SENSITIVE Sensitive     CEFTRIAXONE <=0.25 SENSITIVE Sensitive     CIPROFLOXACIN <=0.25 SENSITIVE Sensitive     GENTAMICIN <=1 SENSITIVE Sensitive     IMIPENEM <=0.25 SENSITIVE Sensitive     TRIMETH/SULFA <=20 SENSITIVE Sensitive     AMPICILLIN/SULBACTAM 8 SENSITIVE Sensitive     PIP/TAZO <=4 SENSITIVE Sensitive     * KLEBSIELLA OXYTOCA  Urine Culture     Status: Abnormal   Collection Time: 11/06/2021 10:16 AM   Specimen: In/Out Cath Urine  Result Value Ref Range Status   Specimen Description   Final    IN/OUT CATH URINE Performed at [facility] [address] [place], [place] [address]    Special Requests   Final    NONE Performed at [facility], [place] [address] [place], [place] [address]    Culture (A)  Final    >=100,000 COLONIES/mL KLEBSIELLA OXYTOCA >=100,000 COLONIES/mL ENTEROCOCCUS FAECALIS    Report Status 11/12/2021 FINAL  Final   Organism ID, Bacteria KLEBSIELLA OXYTOCA (A)  Final   Organism ID, Bacteria ENTEROCOCCUS FAECALIS (A)  Final      Susceptibility   Enterococcus faecalis - MIC*    AMPICILLIN <=2 SENSITIVE Sensitive     NITROFURANTOIN <=16 SENSITIVE Sensitive     VANCOMYCIN 1 SENSITIVE Sensitive     * >=100,000 COLONIES/mL ENTEROCOCCUS FAECALIS   Klebsiella oxytoca - MIC*    AMPICILLIN >=32 RESISTANT Resistant     CEFAZOLIN 8 SENSITIVE Sensitive     CEFEPIME <=0.12  SENSITIVE Sensitive     CEFTRIAXONE <=0.25 SENSITIVE Sensitive     CIPROFLOXACIN <=0.25 SENSITIVE Sensitive     GENTAMICIN <=1 SENSITIVE Sensitive     IMIPENEM <=0.25 SENSITIVE Sensitive     NITROFURANTOIN 32 SENSITIVE Sensitive     TRIMETH/SULFA <=20 SENSITIVE Sensitive     AMPICILLIN/SULBACTAM 8 SENSITIVE Sensitive     PIP/TAZO <=4 SENSITIVE Sensitive     * >=  100,000 COLONIES/mL KLEBSIELLA OXYTOCA  Blood Culture ID Panel (Reflexed)     Status: Abnormal   Collection Time: 11/02/2021 10:16 AM  Result Value Ref Range Status   Enterococcus faecalis NOT DETECTED NOT DETECTED Final   Enterococcus Faecium NOT DETECTED NOT DETECTED Final   Listeria monocytogenes NOT DETECTED NOT DETECTED Final   Staphylococcus species NOT DETECTED NOT DETECTED Final   Staphylococcus aureus (BCID) NOT DETECTED NOT DETECTED Final   Staphylococcus epidermidis NOT DETECTED NOT DETECTED Final   Staphylococcus lugdunensis NOT DETECTED NOT DETECTED Final   Streptococcus species NOT DETECTED NOT DETECTED Final   Streptococcus agalactiae NOT DETECTED NOT DETECTED Final   Streptococcus pneumoniae NOT DETECTED NOT DETECTED Final   Streptococcus pyogenes NOT DETECTED NOT DETECTED Final   A.calcoaceticus-baumannii NOT DETECTED NOT DETECTED Final   Bacteroides fragilis NOT DETECTED NOT DETECTED Final   Enterobacterales DETECTED (A) NOT DETECTED Final    Comment: Enterobacterales represent a large order of gram negative bacteria, not a single organism. CRITICAL RESULT CALLED TO, READ BACK BY AND VERIFIED WITH: M LILLISTON,PHARMD@[email or web address]  11/10/21 [place]    Enterobacter cloacae complex NOT DETECTED NOT DETECTED Final   Escherichia coli NOT DETECTED NOT DETECTED Final   Klebsiella aerogenes NOT DETECTED NOT DETECTED Final   Klebsiella oxytoca DETECTED (A) NOT DETECTED Final    Comment: CRITICAL RESULT CALLED TO, READ BACK BY AND VERIFIED WITH: M LILLISTON,PHARMD@[email or web address]  11/10/21 [place]    Klebsiella pneumoniae NOT DETECTED NOT  DETECTED Final   Proteus species NOT DETECTED NOT DETECTED Final   Salmonella species NOT DETECTED NOT DETECTED Final   Serratia marcescens NOT DETECTED NOT DETECTED Final   Haemophilus influenzae NOT DETECTED NOT DETECTED Final   Neisseria meningitidis NOT DETECTED NOT DETECTED Final   Pseudomonas aeruginosa NOT DETECTED NOT DETECTED Final   Stenotrophomonas maltophilia NOT DETECTED NOT DETECTED Final   Candida albicans NOT DETECTED NOT DETECTED Final   Candida auris NOT DETECTED NOT DETECTED Final   Candida glabrata NOT DETECTED NOT DETECTED Final   Candida krusei NOT DETECTED NOT DETECTED Final   Candida parapsilosis NOT DETECTED NOT DETECTED Final   Candida tropicalis NOT DETECTED NOT DETECTED Final   Cryptococcus neoformans/gattii NOT DETECTED NOT DETECTED Final   CTX-M ESBL NOT DETECTED NOT DETECTED Final   Carbapenem resistance IMP NOT DETECTED NOT DETECTED Final   Carbapenem resistance KPC NOT DETECTED NOT DETECTED Final   Carbapenem resistance NDM NOT DETECTED NOT DETECTED Final   Carbapenem resist OXA 48 LIKE NOT DETECTED NOT DETECTED Final   Carbapenem resistance VIM NOT DETECTED NOT DETECTED Final    Comment: Performed at [facility], [address], [place], [place] [address]  Blood Culture (routine x 2)     Status: Abnormal   Collection Time: 11/24/2021 10:21 AM   Specimen: BLOOD RIGHT WRIST  Result Value Ref Range Status   Specimen Description   Final    BLOOD RIGHT WRIST Performed at [facility], [address], [place], [place] [address]    Special Requests   Final    BOTTLES DRAWN AEROBIC ONLY Blood Culture adequate volume Performed at [facility] [address], [place], [place] [address]    Culture  Setup Time   Final    GRAM NEGATIVE RODS AEROBIC BOTTLE ONLY CRITICAL VALUE NOTED.  VALUE IS CONSISTENT WITH PREVIOUSLY REPORTED AND CALLED VALUE.    Culture (A)  Final    KLEBSIELLA OXYTOCA SUSCEPTIBILITIES PERFORMED ON  PREVIOUS CULTURE WITHIN THE LAST 5 DAYS.  Performed at [facility], [place] [address], [place], [place] [address]    Report Status 11/12/2021 FINAL  Final  Resp Panel by RT-PCR (Flu A&B, Covid) Nasopharyngeal Swab     Status: None   Collection Time: 11/28/2021 11:18 AM   Specimen: Nasopharyngeal Swab; Nasopharyngeal(NP) swabs in vial transport medium  Result Value Ref Range Status   SARS Coronavirus 2 by RT PCR NEGATIVE NEGATIVE Final    Comment: (NOTE) SARS-CoV-2 target nucleic acids are NOT DETECTED.  The SARS-CoV-2 RNA is generally detectable in upper respiratory specimens during the acute phase of infection. The lowest concentration of SARS-CoV-2 viral copies this assay can detect is 138 copies/mL. A negative result does not preclude SARS-Cov-2 infection and should not be used as the sole basis for treatment or other patient management decisions. A negative result may occur with  improper specimen collection/handling, submission of specimen other than nasopharyngeal swab, presence of viral mutation(s) within the areas targeted by this assay, and inadequate number of viral copies(<138 copies/mL). A negative result must be combined with clinical observations, patient history, and epidemiological information. The expected result is Negative.  Fact Sheet for Patients:  EntrepreneurPulse.com.au  Fact Sheet for Healthcare Providers:  IncredibleEmployment.be  This test is no t yet approved or cleared by the [place] FDA and  has been authorized for detection and/or diagnosis of SARS-CoV-2 by FDA under an Emergency Use Authorization (EUA). This EUA will remain  in effect (meaning this test can be used) for the duration of the COVID-19 declaration under Section 564(b)(1) of the Act, 21 U.S.C.section 360bbb-3(b)(1), unless the authorization is terminated  or revoked sooner.       Influenza A by PCR NEGATIVE NEGATIVE Final   Influenza B by  PCR NEGATIVE NEGATIVE Final    Comment: (NOTE) The Xpert Xpress SARS-CoV-2/FLU/RSV plus assay is intended as an aid in the diagnosis of influenza from Nasopharyngeal swab specimens and should not be used as a sole basis for treatment. Nasal washings and aspirates are unacceptable for Xpert Xpress SARS-CoV-2/FLU/RSV testing.  Fact Sheet for Patients: EntrepreneurPulse.com.au  Fact Sheet for Healthcare Providers: IncredibleEmployment.be  This test is not yet approved or cleared by the [place] FDA and has been authorized for detection and/or diagnosis of SARS-CoV-2 by FDA under an Emergency Use Authorization (EUA). This EUA will remain in effect (meaning this test can be used) for the duration of the COVID-19 declaration under Section 564(b)(1) of the Act, 21 U.S.C. section 360bbb-3(b)(1), unless the authorization is terminated or revoked.  Performed at [facility], [place] [address] [place], [place] [address]          Radiology Studies: CT ABDOMEN PELVIS WO CONTRAST  Result Date: 11/17/2021 CLINICAL DATA:  Partial small bowel obstruction versus ileus, status post laparoscopic cystoprostatectomy and ileal conduit urinary diversion, adhesiolysis, small-bowel resection EXAM: CT CHEST, ABDOMEN AND PELVIS WITHOUT CONTRAST CT LUMBAR SPINE WITHOUT CONTRAST TECHNIQUE: Multidetector CT imaging of the chest, abdomen and pelvis was performed following the standard protocol without IV contrast. Oral enteric contrast was administered Multidetector CT imaging of the lumbar spine was performed following the standard protocol without IV contrast. COMPARISON:  11/13/2021 FINDINGS: CT CHEST FINDINGS Cardiovascular: Right upper extremity PICC. Normal heart size. Three-vessel coronary artery calcifications. No pericardial effusion. Mediastinum/Nodes: No enlarged mediastinal, hilar, or axillary lymph nodes. Thyroid gland, trachea, and esophagus  demonstrate no significant findings. Lungs/Pleura: Mild centrilobular emphysema. Probable mild, underlying pulmonary fibrosis in a bibasilar predominance pattern featuring irregular peripheral interstitial opacity,  evaluation limited by the presence of pleural effusions and breath motion artifact. Small bilateral pleural effusions. Musculoskeletal: No chest wall mass or suspicious bone lesions identified. CT ABDOMEN PELVIS FINDINGS Hepatobiliary: No solid liver abnormality is seen. No gallstones, gallbladder wall thickening, or biliary dilatation. Pancreas: Unremarkable. No pancreatic ductal dilatation or surrounding inflammatory changes. Spleen: Normal in size without significant abnormality. Adrenals/Urinary Tract: Adrenal glands are unremarkable. Status post cystoprostatectomy and right lower quadrant ileal conduit urinary diversion. Mild bilateral hydronephrosis. Bilateral double-J ureteral stents in position. The left renal pelvic pigtail is incompletely formed (series 2, image 76). Stomach/Bowel: Stomach is within normal limits. Appendix appears normal. The colon and rectum is diffusely distended and air and fluid-filled, measuring up to 8.5 cm in caliber. Vascular/Lymphatic: No significant vascular findings are present. No enlarged abdominal or pelvic lymph nodes. Reproductive: No mass or other abnormality. Other: Severe anasarca about the lower abdomen and pelvis (series 2, image 117). Subcutaneous emphysema seen on prior examination is almost entirely resolved (series 2, image 114). There is a large, elongated air and fluid collection in the left hemiabdomen about the left paracolic gutter, low midline abdomen, and pelvis, although difficult to exactly measure, at least 25 point by 8.0 x 6.2 cm (series 2, image 103, series 5, image 55, series 2, image 115). Musculoskeletal: No acute or significant osseous findings. LUMBAR SPINE: Alignment: Normal. Disc spaces: Status post posterior discectomy and fusion of  L2 through S1 with partially incorporated disc spaces. No evidence of perihardware fracture or loosening. Vertebral bodies: Intact. IMPRESSION: 1. No evidence of bowel obstruction. The colon rectum are diffusely distended and air and fluid-filled, consistent with ileus. 2. There is a large, elongated air and fluid collection in the left hemiabdomen about the left paracolic gutter, low midline abdomen, and pelvis, although difficult to exactly measure, at least 25.5 cm in greatest dimension. Findings are consistent with abscess. 3. Status post cystoprostatectomy and right lower quadrant ileal conduit urinary diversion. 4. Mild bilateral hydronephrosis. Bilateral double-J ureteral stents in position. The left renal pelvic pigtail is incompletely formed. 5. Small bilateral pleural effusions. 6. Probable mild, underlying pulmonary fibrosis in a bibasilar predominance pattern featuring irregular peripheral interstitial opacity, evaluation limited by the presence of pleural effusions and breath motion artifact. This could be further evaluated by ILD protocol CT on an outpatient basis when clinically appropriate. 7. Coronary artery disease. 8. Status post posterior discectomy and fusion of L2 through S1 with partially incorporated disc spaces. No evidence of perihardware fracture or loosening. No acute findings of the lumbar spine. These results will be called to the ordering clinician or representative by the Radiologist Assistant, and communication documented in the PACS or [employer]. Electronically Signed   By: Delanna Ahmadi M.D.   On: 11/17/2021 12:00   CT CHEST WO CONTRAST  Result Date: 11/17/2021 CLINICAL DATA:  Partial small bowel obstruction versus ileus, status post laparoscopic cystoprostatectomy and ileal conduit urinary diversion, adhesiolysis, small-bowel resection EXAM: CT CHEST, ABDOMEN AND PELVIS WITHOUT CONTRAST CT LUMBAR SPINE WITHOUT CONTRAST TECHNIQUE: Multidetector CT imaging of the chest,  abdomen and pelvis was performed following the standard protocol without IV contrast. Oral enteric contrast was administered Multidetector CT imaging of the lumbar spine was performed following the standard protocol without IV contrast. COMPARISON:  11/07/2021 FINDINGS: CT CHEST FINDINGS Cardiovascular: Right upper extremity PICC. Normal heart size. Three-vessel coronary artery calcifications. No pericardial effusion. Mediastinum/Nodes: No enlarged mediastinal, hilar, or axillary lymph nodes. Thyroid gland, trachea, and esophagus demonstrate no significant findings. Lungs/Pleura:  Mild centrilobular emphysema. Probable mild, underlying pulmonary fibrosis in a bibasilar predominance pattern featuring irregular peripheral interstitial opacity, evaluation limited by the presence of pleural effusions and breath motion artifact. Small bilateral pleural effusions. Musculoskeletal: No chest wall mass or suspicious bone lesions identified. CT ABDOMEN PELVIS FINDINGS Hepatobiliary: No solid liver abnormality is seen. No gallstones, gallbladder wall thickening, or biliary dilatation. Pancreas: Unremarkable. No pancreatic ductal dilatation or surrounding inflammatory changes. Spleen: Normal in size without significant abnormality. Adrenals/Urinary Tract: Adrenal glands are unremarkable. Status post cystoprostatectomy and right lower quadrant ileal conduit urinary diversion. Mild bilateral hydronephrosis. Bilateral double-J ureteral stents in position. The left renal pelvic pigtail is incompletely formed (series 2, image 76). Stomach/Bowel: Stomach is within normal limits. Appendix appears normal. The colon and rectum is diffusely distended and air and fluid-filled, measuring up to 8.5 cm in caliber. Vascular/Lymphatic: No significant vascular findings are present. No enlarged abdominal or pelvic lymph nodes. Reproductive: No mass or other abnormality. Other: Severe anasarca about the lower abdomen and pelvis (series 2, image  117). Subcutaneous emphysema seen on prior examination is almost entirely resolved (series 2, image 114). There is a large, elongated air and fluid collection in the left hemiabdomen about the left paracolic gutter, low midline abdomen, and pelvis, although difficult to exactly measure, at least 25 point by 8.0 x 6.2 cm (series 2, image 103, series 5, image 55, series 2, image 115). Musculoskeletal: No acute or significant osseous findings. LUMBAR SPINE: Alignment: Normal. Disc spaces: Status post posterior discectomy and fusion of L2 through S1 with partially incorporated disc spaces. No evidence of perihardware fracture or loosening. Vertebral bodies: Intact. IMPRESSION: 1. No evidence of bowel obstruction. The colon rectum are diffusely distended and air and fluid-filled, consistent with ileus. 2. There is a large, elongated air and fluid collection in the left hemiabdomen about the left paracolic gutter, low midline abdomen, and pelvis, although difficult to exactly measure, at least 25.5 cm in greatest dimension. Findings are consistent with abscess. 3. Status post cystoprostatectomy and right lower quadrant ileal conduit urinary diversion. 4. Mild bilateral hydronephrosis. Bilateral double-J ureteral stents in position. The left renal pelvic pigtail is incompletely formed. 5. Small bilateral pleural effusions. 6. Probable mild, underlying pulmonary fibrosis in a bibasilar predominance pattern featuring irregular peripheral interstitial opacity, evaluation limited by the presence of pleural effusions and breath motion artifact. This could be further evaluated by ILD protocol CT on an outpatient basis when clinically appropriate. 7. Coronary artery disease. 8. Status post posterior discectomy and fusion of L2 through S1 with partially incorporated disc spaces. No evidence of perihardware fracture or loosening. No acute findings of the lumbar spine. These results will be called to the ordering clinician or  representative by the Radiologist Assistant, and communication documented in the PACS or [employer]. Electronically Signed   By: Delanna Ahmadi M.D.   On: 11/17/2021 12:00   CT LUMBAR SPINE WO CONTRAST  Result Date: 11/17/2021 CLINICAL DATA:  Partial small bowel obstruction versus ileus, status post laparoscopic cystoprostatectomy and ileal conduit urinary diversion, adhesiolysis, small-bowel resection EXAM: CT CHEST, ABDOMEN AND PELVIS WITHOUT CONTRAST CT LUMBAR SPINE WITHOUT CONTRAST TECHNIQUE: Multidetector CT imaging of the chest, abdomen and pelvis was performed following the standard protocol without IV contrast. Oral enteric contrast was administered Multidetector CT imaging of the lumbar spine was performed following the standard protocol without IV contrast. COMPARISON:  11/07/2021 FINDINGS: CT CHEST FINDINGS Cardiovascular: Right upper extremity PICC. Normal heart size. Three-vessel coronary artery calcifications. No pericardial effusion.  Mediastinum/Nodes: No enlarged mediastinal, hilar, or axillary lymph nodes. Thyroid gland, trachea, and esophagus demonstrate no significant findings. Lungs/Pleura: Mild centrilobular emphysema. Probable mild, underlying pulmonary fibrosis in a bibasilar predominance pattern featuring irregular peripheral interstitial opacity, evaluation limited by the presence of pleural effusions and breath motion artifact. Small bilateral pleural effusions. Musculoskeletal: No chest wall mass or suspicious bone lesions identified. CT ABDOMEN PELVIS FINDINGS Hepatobiliary: No solid liver abnormality is seen. No gallstones, gallbladder wall thickening, or biliary dilatation. Pancreas: Unremarkable. No pancreatic ductal dilatation or surrounding inflammatory changes. Spleen: Normal in size without significant abnormality. Adrenals/Urinary Tract: Adrenal glands are unremarkable. Status post cystoprostatectomy and right lower quadrant ileal conduit urinary diversion. Mild  bilateral hydronephrosis. Bilateral double-J ureteral stents in position. The left renal pelvic pigtail is incompletely formed (series 2, image 76). Stomach/Bowel: Stomach is within normal limits. Appendix appears normal. The colon and rectum is diffusely distended and air and fluid-filled, measuring up to 8.5 cm in caliber. Vascular/Lymphatic: No significant vascular findings are present. No enlarged abdominal or pelvic lymph nodes. Reproductive: No mass or other abnormality. Other: Severe anasarca about the lower abdomen and pelvis (series 2, image 117). Subcutaneous emphysema seen on prior examination is almost entirely resolved (series 2, image 114). There is a large, elongated air and fluid collection in the left hemiabdomen about the left paracolic gutter, low midline abdomen, and pelvis, although difficult to exactly measure, at least 25 point by 8.0 x 6.2 cm (series 2, image 103, series 5, image 55, series 2, image 115). Musculoskeletal: No acute or significant osseous findings. LUMBAR SPINE: Alignment: Normal. Disc spaces: Status post posterior discectomy and fusion of L2 through S1 with partially incorporated disc spaces. No evidence of perihardware fracture or loosening. Vertebral bodies: Intact. IMPRESSION: 1. No evidence of bowel obstruction. The colon rectum are diffusely distended and air and fluid-filled, consistent with ileus. 2. There is a large, elongated air and fluid collection in the left hemiabdomen about the left paracolic gutter, low midline abdomen, and pelvis, although difficult to exactly measure, at least 25.5 cm in greatest dimension. Findings are consistent with abscess. 3. Status post cystoprostatectomy and right lower quadrant ileal conduit urinary diversion. 4. Mild bilateral hydronephrosis. Bilateral double-J ureteral stents in position. The left renal pelvic pigtail is incompletely formed. 5. Small bilateral pleural effusions. 6. Probable mild, underlying pulmonary fibrosis in a  bibasilar predominance pattern featuring irregular peripheral interstitial opacity, evaluation limited by the presence of pleural effusions and breath motion artifact. This could be further evaluated by ILD protocol CT on an outpatient basis when clinically appropriate. 7. Coronary artery disease. 8. Status post posterior discectomy and fusion of L2 through S1 with partially incorporated disc spaces. No evidence of perihardware fracture or loosening. No acute findings of the lumbar spine. These results will be called to the ordering clinician or representative by the Radiologist Assistant, and communication documented in the PACS or [employer]. Electronically Signed   By: Delanna Ahmadi M.D.   On: 11/17/2021 12:00   DG CHEST PORT 1 VIEW  Result Date: 11/17/2021 CLINICAL DATA:  Back pain, weakness, short of breath EXAM: PORTABLE CHEST 1 VIEW COMPARISON:  11/10/2021 FINDINGS: Central line removed.  NG removed. Mild right lower lobe airspace disease unchanged Improvement in left lower lobe airspace disease and small left effusion. Negative for pulmonary edema. IMPRESSION: Mild improvement in left lower lobe airspace disease and left effusion. No change in right lower lobe airspace disease. Electronically Signed   By: Franchot Gallo M.D.  On: 11/17/2021 10:50   DG Abd Portable 1V-Small Bowel Obstruction Protocol-initial, 8 hr delay  Result Date: 11/16/2021 CLINICAL DATA:  Small bowel protocol, 8 hour postcontrast film. EXAM: PORTABLE ABDOMEN - 1 VIEW COMPARISON:  Abdominal x-ray 11/16/2021, 9:36 a.m. FINDINGS: No significant oral contrast identified. Diffusely air-filled dilated large and small bowel appears similar to the prior study. Ureteral stents are unchanged in position. Catheters overlie the right abdomen. Lumbar fusion hardware is unchanged. There are no suspicious calcifications. No acute fractures are identified. IMPRESSION: 1. No oral contrast visualized. Stable diffuse small and large bowel  dilatation. Electronically Signed   By: Ronney Asters M.D.   On: 11/16/2021 23:05   DG Abd Portable 1V  Result Date: 11/16/2021 CLINICAL DATA:  Small-bowel obstruction EXAM: PORTABLE ABDOMEN - 1 VIEW COMPARISON:  KUB 11/15/2021 FINDINGS: There is significant gaseous distention of the small and large bowel, grossly similar to the study dated 1 day prior. There is no definite free intraperitoneal air, though this is suboptimally evaluated given supine technique. Bilateral ureteral stents are again seen which course over the right hemiabdomen, unchanged. Lumbar spine fusion hardware is again seen. IMPRESSION: Overall no significant interval change in degree of gaseous distention of the small and large bowel compared to the study from 1 day prior. Electronically Signed   By: Valetta Mole M.D.   On: 11/16/2021 10:35   [place] EKG SITE RITE  Result Date: 11/16/2021 If Site Rite image not attached, placement could not be confirmed due to current cardiac rhythm.       Scheduled Meds:  bisacodyl  10 mg Rectal Daily   chlorhexidine  15 mL Mouth Rinse BID   Chlorhexidine Gluconate Cloth  6 each Topical Daily   diatrizoate meglumine-sodium       feeding supplement  237 mL Oral TID BM   fluticasone  2 spray Each Nare Daily   heparin  5,000 Units Subcutaneous Q8H   insulin aspart  0-9 Units Subcutaneous Q6H   lip balm  1 application Topical BID   magic mouthwash w/lidocaine  10 mL Oral QID   mouth rinse  15 mL Mouth Rinse q12n4p   nystatin  5 mL Oral QID   nystatin   Topical BID   simethicone  80 mg Oral QID   sodium chloride flush  10-40 mL Intracatheter Q12H   sodium chloride flush  10-40 mL Intracatheter Q12H   umeclidinium bromide  1 puff Inhalation Daily   Continuous Infusions:  sodium chloride 75 mL/hr at 11/14/21 2328   albumin human     ampicillin-sulbactam (UNASYN) IV 3 g (11/17/21 0806)   chlorproMAZINE (THORAZINE) IV 12.5 mg (11/17/21 0538)   methocarbamol (ROBAXIN) IV      ondansetron (ZOFRAN) IV     TPN ADULT (ION) 40 mL/hr at 11/16/21 1751   TPN ADULT (ION)       LOS: 8 days       Kayleen Memos, DO [facility] Available via Epic secure chat 7am-7pm After these hours, please refer to coverage provider listed on [email or web address] 11/17/2021, 12:36 PM

## 2021-11-17 NOTE — Progress Notes (Signed)
Subjective/Chief Complaint:  1 - Bladder Cancer - s/p cystectomy with lysis of adhesions and small bowel resection 10/25/2021 for stage 2 bladder cancer with negative nodes and margins, Had neoadjuvant chemo prior.    2 - Urinary Diversion - s/p conduit diversion at cystectomy 09/2021. Stents in good position by ER CT this admission.    3 - Sepsis, Pyelonephritis, Abdominal Abscess R/O CDiff- tachycardia, chills, lactic acidosis c/w major sepsis at ER eval 11/10. CT with expected subcutaneous gas (from insuflattion at procedure), some pelvic fluid. NO significant inraperitoneal air, stents in good position. Some dilated small bowel. UCX and BCX c/w coliform pyelo and placed on therapy with improvement. Klebsiella and Enterococcus.  Repeat CT 11/18 with pelvic fluic collection, no bowel obstruction, has been having freqeunt loose stools and worsening leukocytosis as well as abdominal pain.    Today "William [name]" is seen  in f/u above. His infectious parameters are worsening again. Overall clinical picture c/w abdominal abscess and / or C Diff.    Objective: Vital signs in last 24 hours: Temp:  [97.8 F (36.6 C)-99.7 F (37.6 C)] 99.4 F (37.4 C) (11/18 1039) Pulse Rate:  [106-135] 135 (11/18 1039) Resp:  [16-20] 18 (11/18 1039) BP: (110-142)/(61-85) 110/84 (11/18 1039) SpO2:  [90 %-95 %] 90 % (11/18 1039) Last BM Date: 11/17/21  Intake/Output from previous day: 11/17 0701 - 11/18 0700 In: 3081.8 [P.O.:420; I.V.:1969.1; IV Piggyback:692.7] Out: 900 [Urine:900] Intake/Output this shift: No intake/output data recorded.  EXAM: Ill appearing. Some mild increase WOB. Diffuse anasarca. [place] O2 Regular tachycardia Distended tympanic abd. NO rebound / guarding RLQ urostomy pink / patent with distal bilateral stents in place and non-foul urine Edematous scrotum and legs c/w overall edema   Lab Results:  Recent Labs    11/16/21 1201 11/17/21 0521  WBC 20.7* 24.4*  HGB 8.5* 9.6*  HCT  26.9* 30.8*  PLT 314 305   BMET Recent Labs    11/16/21 1201 11/17/21 0521  NA 136 135  K 3.1* 3.6  CL 104 104  CO2 25 24  GLUCOSE 93 126*  BUN 19 22  CREATININE 1.00 1.74*  CALCIUM 7.2* 7.4*   PT/INR No results for input(s): LABPROT, INR in the last 72 hours. ABG Recent Labs    11/17/21 0955  PHART 7.435  HCO3 22.4    Studies/Results: CT ABDOMEN PELVIS WO CONTRAST  Result Date: 11/17/2021 CLINICAL DATA:  Partial small bowel obstruction versus ileus, status post laparoscopic cystoprostatectomy and ileal conduit urinary diversion, adhesiolysis, small-bowel resection EXAM: CT CHEST, ABDOMEN AND PELVIS WITHOUT CONTRAST CT LUMBAR SPINE WITHOUT CONTRAST TECHNIQUE: Multidetector CT imaging of the chest, abdomen and pelvis was performed following the standard protocol without IV contrast. Oral enteric contrast was administered Multidetector CT imaging of the lumbar spine was performed following the standard protocol without IV contrast. COMPARISON:  11/14/2021 FINDINGS: CT CHEST FINDINGS Cardiovascular: Right upper extremity PICC. Normal heart size. Three-vessel coronary artery calcifications. No pericardial effusion. Mediastinum/Nodes: No enlarged mediastinal, hilar, or axillary lymph nodes. Thyroid gland, trachea, and esophagus demonstrate no significant findings. Lungs/Pleura: Mild centrilobular emphysema. Probable mild, underlying pulmonary fibrosis in a bibasilar predominance pattern featuring irregular peripheral interstitial opacity, evaluation limited by the presence of pleural effusions and breath motion artifact. Small bilateral pleural effusions. Musculoskeletal: No chest wall mass or suspicious bone lesions identified. CT ABDOMEN PELVIS FINDINGS Hepatobiliary: No solid liver abnormality is seen. No gallstones, gallbladder wall thickening, or biliary dilatation. Pancreas: Unremarkable. No pancreatic ductal dilatation or surrounding inflammatory changes. Spleen:  Normal in size  without significant abnormality. Adrenals/Urinary Tract: Adrenal glands are unremarkable. Status post cystoprostatectomy and right lower quadrant ileal conduit urinary diversion. Mild bilateral hydronephrosis. Bilateral double-J ureteral stents in position. The left renal pelvic pigtail is incompletely formed (series 2, image 76). Stomach/Bowel: Stomach is within normal limits. Appendix appears normal. The colon and rectum is diffusely distended and air and fluid-filled, measuring up to 8.5 cm in caliber. Vascular/Lymphatic: No significant vascular findings are present. No enlarged abdominal or pelvic lymph nodes. Reproductive: No mass or other abnormality. Other: Severe anasarca about the lower abdomen and pelvis (series 2, image 117). Subcutaneous emphysema seen on prior examination is almost entirely resolved (series 2, image 114). There is a large, elongated air and fluid collection in the left hemiabdomen about the left paracolic gutter, low midline abdomen, and pelvis, although difficult to exactly measure, at least 25 point by 8.0 x 6.2 cm (series 2, image 103, series 5, image 55, series 2, image 115). Musculoskeletal: No acute or significant osseous findings. LUMBAR SPINE: Alignment: Normal. Disc spaces: Status post posterior discectomy and fusion of L2 through S1 with partially incorporated disc spaces. No evidence of perihardware fracture or loosening. Vertebral bodies: Intact. IMPRESSION: 1. No evidence of bowel obstruction. The colon rectum are diffusely distended and air and fluid-filled, consistent with ileus. 2. There is a large, elongated air and fluid collection in the left hemiabdomen about the left paracolic gutter, low midline abdomen, and pelvis, although difficult to exactly measure, at least 25.5 cm in greatest dimension. Findings are consistent with abscess. 3. Status post cystoprostatectomy and right lower quadrant ileal conduit urinary diversion. 4. Mild bilateral hydronephrosis. Bilateral  double-J ureteral stents in position. The left renal pelvic pigtail is incompletely formed. 5. Small bilateral pleural effusions. 6. Probable mild, underlying pulmonary fibrosis in a bibasilar predominance pattern featuring irregular peripheral interstitial opacity, evaluation limited by the presence of pleural effusions and breath motion artifact. This could be further evaluated by ILD protocol CT on an outpatient basis when clinically appropriate. 7. Coronary artery disease. 8. Status post posterior discectomy and fusion of L2 through S1 with partially incorporated disc spaces. No evidence of perihardware fracture or loosening. No acute findings of the lumbar spine. These results will be called to the ordering clinician or representative by the Radiologist Assistant, and communication documented in the PACS or [employer]. Electronically Signed   By: Delanna Ahmadi M.D.   On: 11/17/2021 12:00   CT CHEST WO CONTRAST  Result Date: 11/17/2021 CLINICAL DATA:  Partial small bowel obstruction versus ileus, status post laparoscopic cystoprostatectomy and ileal conduit urinary diversion, adhesiolysis, small-bowel resection EXAM: CT CHEST, ABDOMEN AND PELVIS WITHOUT CONTRAST CT LUMBAR SPINE WITHOUT CONTRAST TECHNIQUE: Multidetector CT imaging of the chest, abdomen and pelvis was performed following the standard protocol without IV contrast. Oral enteric contrast was administered Multidetector CT imaging of the lumbar spine was performed following the standard protocol without IV contrast. COMPARISON:  11/28/2021 FINDINGS: CT CHEST FINDINGS Cardiovascular: Right upper extremity PICC. Normal heart size. Three-vessel coronary artery calcifications. No pericardial effusion. Mediastinum/Nodes: No enlarged mediastinal, hilar, or axillary lymph nodes. Thyroid gland, trachea, and esophagus demonstrate no significant findings. Lungs/Pleura: Mild centrilobular emphysema. Probable mild, underlying pulmonary fibrosis in a  bibasilar predominance pattern featuring irregular peripheral interstitial opacity, evaluation limited by the presence of pleural effusions and breath motion artifact. Small bilateral pleural effusions. Musculoskeletal: No chest wall mass or suspicious bone lesions identified. CT ABDOMEN PELVIS FINDINGS Hepatobiliary: No solid liver abnormality is seen.  No gallstones, gallbladder wall thickening, or biliary dilatation. Pancreas: Unremarkable. No pancreatic ductal dilatation or surrounding inflammatory changes. Spleen: Normal in size without significant abnormality. Adrenals/Urinary Tract: Adrenal glands are unremarkable. Status post cystoprostatectomy and right lower quadrant ileal conduit urinary diversion. Mild bilateral hydronephrosis. Bilateral double-J ureteral stents in position. The left renal pelvic pigtail is incompletely formed (series 2, image 76). Stomach/Bowel: Stomach is within normal limits. Appendix appears normal. The colon and rectum is diffusely distended and air and fluid-filled, measuring up to 8.5 cm in caliber. Vascular/Lymphatic: No significant vascular findings are present. No enlarged abdominal or pelvic lymph nodes. Reproductive: No mass or other abnormality. Other: Severe anasarca about the lower abdomen and pelvis (series 2, image 117). Subcutaneous emphysema seen on prior examination is almost entirely resolved (series 2, image 114). There is a large, elongated air and fluid collection in the left hemiabdomen about the left paracolic gutter, low midline abdomen, and pelvis, although difficult to exactly measure, at least 25 point by 8.0 x 6.2 cm (series 2, image 103, series 5, image 55, series 2, image 115). Musculoskeletal: No acute or significant osseous findings. LUMBAR SPINE: Alignment: Normal. Disc spaces: Status post posterior discectomy and fusion of L2 through S1 with partially incorporated disc spaces. No evidence of perihardware fracture or loosening. Vertebral bodies: Intact.  IMPRESSION: 1. No evidence of bowel obstruction. The colon rectum are diffusely distended and air and fluid-filled, consistent with ileus. 2. There is a large, elongated air and fluid collection in the left hemiabdomen about the left paracolic gutter, low midline abdomen, and pelvis, although difficult to exactly measure, at least 25.5 cm in greatest dimension. Findings are consistent with abscess. 3. Status post cystoprostatectomy and right lower quadrant ileal conduit urinary diversion. 4. Mild bilateral hydronephrosis. Bilateral double-J ureteral stents in position. The left renal pelvic pigtail is incompletely formed. 5. Small bilateral pleural effusions. 6. Probable mild, underlying pulmonary fibrosis in a bibasilar predominance pattern featuring irregular peripheral interstitial opacity, evaluation limited by the presence of pleural effusions and breath motion artifact. This could be further evaluated by ILD protocol CT on an outpatient basis when clinically appropriate. 7. Coronary artery disease. 8. Status post posterior discectomy and fusion of L2 through S1 with partially incorporated disc spaces. No evidence of perihardware fracture or loosening. No acute findings of the lumbar spine. These results will be called to the ordering clinician or representative by the Radiologist Assistant, and communication documented in the PACS or [employer]. Electronically Signed   By: Delanna Ahmadi M.D.   On: 11/17/2021 12:00   CT LUMBAR SPINE WO CONTRAST  Result Date: 11/17/2021 CLINICAL DATA:  Partial small bowel obstruction versus ileus, status post laparoscopic cystoprostatectomy and ileal conduit urinary diversion, adhesiolysis, small-bowel resection EXAM: CT CHEST, ABDOMEN AND PELVIS WITHOUT CONTRAST CT LUMBAR SPINE WITHOUT CONTRAST TECHNIQUE: Multidetector CT imaging of the chest, abdomen and pelvis was performed following the standard protocol without IV contrast. Oral enteric contrast was administered  Multidetector CT imaging of the lumbar spine was performed following the standard protocol without IV contrast. COMPARISON:  11/02/2021 FINDINGS: CT CHEST FINDINGS Cardiovascular: Right upper extremity PICC. Normal heart size. Three-vessel coronary artery calcifications. No pericardial effusion. Mediastinum/Nodes: No enlarged mediastinal, hilar, or axillary lymph nodes. Thyroid gland, trachea, and esophagus demonstrate no significant findings. Lungs/Pleura: Mild centrilobular emphysema. Probable mild, underlying pulmonary fibrosis in a bibasilar predominance pattern featuring irregular peripheral interstitial opacity, evaluation limited by the presence of pleural effusions and breath motion artifact. Small bilateral pleural effusions. Musculoskeletal: No  chest wall mass or suspicious bone lesions identified. CT ABDOMEN PELVIS FINDINGS Hepatobiliary: No solid liver abnormality is seen. No gallstones, gallbladder wall thickening, or biliary dilatation. Pancreas: Unremarkable. No pancreatic ductal dilatation or surrounding inflammatory changes. Spleen: Normal in size without significant abnormality. Adrenals/Urinary Tract: Adrenal glands are unremarkable. Status post cystoprostatectomy and right lower quadrant ileal conduit urinary diversion. Mild bilateral hydronephrosis. Bilateral double-J ureteral stents in position. The left renal pelvic pigtail is incompletely formed (series 2, image 76). Stomach/Bowel: Stomach is within normal limits. Appendix appears normal. The colon and rectum is diffusely distended and air and fluid-filled, measuring up to 8.5 cm in caliber. Vascular/Lymphatic: No significant vascular findings are present. No enlarged abdominal or pelvic lymph nodes. Reproductive: No mass or other abnormality. Other: Severe anasarca about the lower abdomen and pelvis (series 2, image 117). Subcutaneous emphysema seen on prior examination is almost entirely resolved (series 2, image 114). There is a large,  elongated air and fluid collection in the left hemiabdomen about the left paracolic gutter, low midline abdomen, and pelvis, although difficult to exactly measure, at least 25 point by 8.0 x 6.2 cm (series 2, image 103, series 5, image 55, series 2, image 115). Musculoskeletal: No acute or significant osseous findings. LUMBAR SPINE: Alignment: Normal. Disc spaces: Status post posterior discectomy and fusion of L2 through S1 with partially incorporated disc spaces. No evidence of perihardware fracture or loosening. Vertebral bodies: Intact. IMPRESSION: 1. No evidence of bowel obstruction. The colon rectum are diffusely distended and air and fluid-filled, consistent with ileus. 2. There is a large, elongated air and fluid collection in the left hemiabdomen about the left paracolic gutter, low midline abdomen, and pelvis, although difficult to exactly measure, at least 25.5 cm in greatest dimension. Findings are consistent with abscess. 3. Status post cystoprostatectomy and right lower quadrant ileal conduit urinary diversion. 4. Mild bilateral hydronephrosis. Bilateral double-J ureteral stents in position. The left renal pelvic pigtail is incompletely formed. 5. Small bilateral pleural effusions. 6. Probable mild, underlying pulmonary fibrosis in a bibasilar predominance pattern featuring irregular peripheral interstitial opacity, evaluation limited by the presence of pleural effusions and breath motion artifact. This could be further evaluated by ILD protocol CT on an outpatient basis when clinically appropriate. 7. Coronary artery disease. 8. Status post posterior discectomy and fusion of L2 through S1 with partially incorporated disc spaces. No evidence of perihardware fracture or loosening. No acute findings of the lumbar spine. These results will be called to the ordering clinician or representative by the Radiologist Assistant, and communication documented in the PACS or [employer]. Electronically Signed    By: Delanna Ahmadi M.D.   On: 11/17/2021 12:00   DG CHEST PORT 1 VIEW  Result Date: 11/17/2021 CLINICAL DATA:  Back pain, weakness, short of breath EXAM: PORTABLE CHEST 1 VIEW COMPARISON:  11/10/2021 FINDINGS: Central line removed.  NG removed. Mild right lower lobe airspace disease unchanged Improvement in left lower lobe airspace disease and small left effusion. Negative for pulmonary edema. IMPRESSION: Mild improvement in left lower lobe airspace disease and left effusion. No change in right lower lobe airspace disease. Electronically Signed   By: Franchot Gallo M.D.   On: 11/17/2021 10:50   DG Abd Portable 1V-Small Bowel Obstruction Protocol-initial, 8 hr delay  Result Date: 11/16/2021 CLINICAL DATA:  Small bowel protocol, 8 hour postcontrast film. EXAM: PORTABLE ABDOMEN - 1 VIEW COMPARISON:  Abdominal x-ray 11/16/2021, 9:36 a.m. FINDINGS: No significant oral contrast identified. Diffusely air-filled dilated large and small bowel  appears similar to the prior study. Ureteral stents are unchanged in position. Catheters overlie the right abdomen. Lumbar fusion hardware is unchanged. There are no suspicious calcifications. No acute fractures are identified. IMPRESSION: 1. No oral contrast visualized. Stable diffuse small and large bowel dilatation. Electronically Signed   By: Ronney Asters M.D.   On: 11/16/2021 23:05   DG Abd Portable 1V  Result Date: 11/16/2021 CLINICAL DATA:  Small-bowel obstruction EXAM: PORTABLE ABDOMEN - 1 VIEW COMPARISON:  KUB 11/15/2021 FINDINGS: There is significant gaseous distention of the small and large bowel, grossly similar to the study dated 1 day prior. There is no definite free intraperitoneal air, though this is suboptimally evaluated given supine technique. Bilateral ureteral stents are again seen which course over the right hemiabdomen, unchanged. Lumbar spine fusion hardware is again seen. IMPRESSION: Overall no significant interval change in degree of gaseous  distention of the small and large bowel compared to the study from 1 day prior. Electronically Signed   By: Valetta Mole M.D.   On: 11/16/2021 10:35   [place] EKG SITE RITE  Result Date: 11/16/2021 If Site Rite image not attached, placement could not be confirmed due to current cardiac rhythm.   Anti-infectives: Anti-infectives (From admission, onward)    Start     Dose/Rate Route Frequency Ordered Stop   11/12/21 0800  Ampicillin-Sulbactam (UNASYN) 3 g in sodium chloride 0.9 % 100 mL IVPB        3 g 200 mL/hr over 30 Minutes Intravenous Every 6 hours 11/12/21 0712 11/18/21 2359   11/10/21 1400  cefTRIAXone (ROCEPHIN) 2 g in sodium chloride 0.9 % 100 mL IVPB  Status:  Discontinued        2 g 200 mL/hr over 30 Minutes Intravenous Daily 11/10/21 1048 11/12/21 0712   11/10/21 0800  ceFEPIme (MAXIPIME) 2 g in sodium chloride 0.9 % 100 mL IVPB  Status:  Discontinued        2 g 200 mL/hr over 30 Minutes Intravenous Every 12 hours 11/10/21 0618 11/10/21 1048   11/10/21 0630  ceFEPIme (MAXIPIME) 2 g in sodium chloride 0.9 % 100 mL IVPB  Status:  Discontinued        2 g 200 mL/hr over 30 Minutes Intravenous Every 24 hours 11/04/2021 1622 11/10/21 0618   11/13/2021 1624  vancomycin variable dose per unstable renal function (pharmacist dosing)  Status:  Discontinued         Does not apply See admin instructions 11/14/2021 1624 11/10/21 0613   11/06/2021 1030  ceFEPIme (MAXIPIME) 2 g in sodium chloride 0.9 % 100 mL IVPB        2 g 200 mL/hr over 30 Minutes Intravenous  Once 11/11/2021 1016 11/13/2021 1133   11/13/2021 1030  metroNIDAZOLE (FLAGYL) IVPB 500 mg        500 mg 100 mL/hr over 60 Minutes Intravenous  Once 11/06/2021 1016 11/23/2021 1915   11/11/2021 1030  vancomycin (VANCOCIN) IVPB 1000 mg/200 mL premix  Status:  Discontinued        1,000 mg 200 mL/hr over 60 Minutes Intravenous  Once 11/15/2021 1016 10/31/2021 1023   11/22/2021 1030  vancomycin (VANCOREADY) IVPB 1500 mg/300 mL        1,500 mg 150 mL/hr over 120  Minutes Intravenous  Once 11/04/2021 1023 11/01/2021 1341       Assessment/Plan:  1 - Bladder Cancer - good prognosis. No further cancer-directed therapy.    2 - Urinary Diversion - working appropriately. Stents in good position  3 - Sepsis, Pyelonephritis, Abdominal Abscess R/O CDiff- rec CDiff testing, consider IR drain of abd-pelvic fluid as now appears complex. Attempted to contact ID today on call and awaiting call for final CD test approval.  Sincelry appreciate medical team and gen surg comanagement with this complex patient.    [name] 11/17/2021

## 2021-11-17 NOTE — Consult Note (Signed)
Renal Service Consult Note [name]  [name] 11/17/2021 [name], MD Requesting Physician: Dr. [name]  Reason for Consult: [name] HPI: The patient is a 80 y.o. year-old w/ hx of bladder cancer, HL, back pain/ back surgery who recently underwent complete cystectomy w/ ileal conduit/ radical prostatectomy and SB resection, LN dissection and LOA done on 10/26/21 by Dr [name], and pt was dc'd on 11/03. Then readmitted on 11/10 presenting w/ hypotension BP in the 50's sent from urology clinic. W/u showed UTI w/ klebsiella oxytoca +urine cx and bacteremia. Pt rec'd IV abx vanc/ maxipime and flagyl, then lowered to Rocephin after cx's were back then changed to Unasyn. Was on vasopressors only for about 36 hrs after admit. WBC improving, ucx also grew enterococcus faecalis. Intra-abd abscess seen on CT measuring 25 cm, and abx were switched to IV zosyn on 11/18 (today). IR consulted. Concern for SBO has pt on full liquid diet. Pt has anasarca, pleural effusions. Pt had AKI on admit w/ creat 2.8 > peak 3.0 then improved to 0.86. Today creat up to 1.74. asked to see for renal failure.   Pt w/ hx of bilat renal stents seen on CT abdomen.  IV abx were   - vanc, flagyl, cefepime for 1 day after admit  - D#2 - switched to Rocephin for 2 days  - D#4 - switched to Unasyn IV  - D#9 - switched to Zosyn (today)  Pt seen in room. He is fully oriented, no sig c/o's.   ROS - denies CP, no joint pain, no HA, no blurry vision, no rash, no diarrhea, no nausea/ vomiting   Past Medical History  Past Medical History:  Diagnosis Date   Bladder cancer ([name])    Pt says small tumor not cancer   Bleeding gums 11/11/2015   COVID 09/23/2020   back ache hurt all over x few days all symptoms reolved   Dyslipidemia (high LDL; low HDL)    Low back pain 12/27/2014   Medial epicondylitis of right elbow 12/13/2014   Numbness and tingling of right leg    Tinnitus    all the time    Toenail fungus    2 big toes   Wears glasses    Past Surgical History  Past Surgical History:  Procedure Laterality Date   bone chip removed from right heel  1998   CYSTOSCOPY W/ RETROGRADES Bilateral 02/08/2021   Procedure: CYSTOSCOPY WITH RETROGRADE PYELOGRAM;  Surgeon: [name], MD;  Location: [facility];  Service: Urology;  Laterality: Bilateral;   CYSTOSCOPY WITH INJECTION N/A 10/25/2021   Procedure: CYSTOSCOPY WITH INJECTION OF INDOCYANINE GREEN DYE;  Surgeon: [name], MD;  Location: [facility];  Service: Urology;  Laterality: N/A;   FINGER AMPUTATION     injured by table saw, left 4th and 5th fingers   IR IMAGING GUIDED PORT INSERTION  03/07/2021   IR REMOVAL TUN ACCESS W/ PORT W/O FL MOD SED  06/29/2021   KNEE ARTHROSCOPY W/ MENISCAL REPAIR  2012 2014   left   KNEE ARTHROSCOPY W/ MENISCAL REPAIR  11/2011   left   LACERATION REPAIR  1962   forehead   LAPAROSCOPIC LYSIS OF ADHESIONS N/A 10/25/2021   Procedure: EXTENSIVE LAPAROSCOPIC LYSIS OF ADHESIONS;  Surgeon: [name], MD;  Location: [facility];  Service: Urology;  Laterality: N/A;   LUMBAR DISC SURGERY  09/2007   bone spurs, stenosis, L3-4, 4-5    LYMPH NODE DISSECTION Bilateral 10/25/2021  Procedure: LYMPH NODE DISSECTION;  Surgeon: [name], MD;  Location: [facility];  Service: Urology;  Laterality: Bilateral;   POSTERIOR LUMBAR FUSION 4 LEVEL N/A 10/13/2013   Procedure: Lumbar Two-Three, Lumbar Three-Four, Lumbar Four-Five, Lumbar Five-Sacral One posterior lumbar interbody fusion with interbody prosthesis posterior lateral arthrodesis and posterior segmental instrumentation;  Surgeon: [name], MD;  Location: [facility];  Service: Neurosurgery;  Laterality: N/A;  POSTERIOR LUMBAR FUSION 4 LEVEL   ROBOT ASSISTED LAPAROSCOPIC COMPLETE CYSTECT ILEAL CONDUIT N/A 10/25/2021   Procedure: XI ROBOTIC ASSISTED LAPAROSCOPIC COMPLETE CYSTECT ILEAL CONDUIT/RADICAL PROSTATECTOMY AND SMALL BOWEL  RESECTION;  Surgeon: [name], MD;  Location: [facility];  Service: Urology;  Laterality: N/A;  6 HRS   SKIN GRAFT     from right thigh to left forehead   SMALL INTESTINE SURGERY  8099   ileitis complications   TRANSURETHRAL RESECTION OF BLADDER TUMOR N/A 02/08/2021   Procedure: TRANSURETHRAL RESECTION OF BLADDER TUMOR (TURBT);  Surgeon: [name], MD;  Location: [facility];  Service: Urology;  Laterality: N/A;   Family History  Family History  Problem Relation Age of Onset   Stroke Mother    Heart disease Father    Stroke Father    Diabetes Father    Obesity Brother    Obesity Brother    Cancer Maternal Grandfather    Cancer Maternal Grandmother    Drug abuse Daughter    Social History  reports that he quit smoking about 35 years ago. His smoking use included cigarettes. He has a 45.00 pack-year smoking history. He has never used smokeless tobacco. He reports that he does not currently use alcohol. He reports that he does not use drugs. Allergies  Allergies  Allergen Reactions   Hydrocodone Other (See Comments)    Causes constipation even with stool softner   Home medications Prior to Admission medications   Medication Sig Start Date End Date Taking? Authorizing Provider  acetaminophen (TYLENOL) 500 MG tablet Take 1,000 mg by mouth every 4 (four) hours as needed for mild pain or fever.   Yes [provider]  fluticasone (FLONASE) 50 MCG/ACT nasal spray Place 2 sprays into both nostrils daily. 01/05/20  Yes [name], MD  gabapentin (NEURONTIN) 300 MG capsule TAKE 3 CAPSULES BY MOUTH AT BEDTIME Patient taking differently: Take 900 mg by mouth at bedtime. 03/09/21  Yes Patel, [name], DO  ibuprofen (ADVIL) 200 MG tablet Take 600 mg by mouth every 4 (four) hours as needed for mild pain.   Yes [provider]  ondansetron (ZOFRAN-ODT) 8 MG disintegrating tablet Take 8 mg by mouth every 8 (eight) hours as needed for nausea/vomiting.  11/06/21  Yes [provider]  oxyCODONE-acetaminophen (PERCOCET) 5-325 MG tablet Take 1 tablet by mouth every 6 (six) hours as needed for severe pain. 11/02/21 11/02/22 Yes [name], MD  SPIRIVA HANDIHALER 18 MCG inhalation capsule PLACE 1 CAPSULE( 18 MCG TOTAL) INTO INHALER AND INHALE DAILY Patient taking differently: Place 18 mcg into inhaler and inhale daily. 08/28/21  Yes [name], MD  tiZANidine (ZANAFLEX) 4 MG tablet Take 4 mg by mouth at bedtime.   Yes [provider]     Vitals:   11/17/21 0538 11/17/21 0738 11/17/21 1039 11/17/21 1218  BP: 129/80 124/61 110/84 113/70  Pulse: (!) 118 (!) 134 (!) 135 (!) 132  Resp: $Remo'17 20 18 19  'UrLlB$ Temp:  98 F (36.7 C) 99.4 F (37.4 C) 97.6 F (36.4 C)  TempSrc:  Oral Oral   SpO2: 92% 91% 90% 98%  Weight:      Height:       Exam Gen alert, no distress, moaning in pain No rash, cyanosis or gangrene Sclera anicteric, throat clear  No jvd or bruits Chest clear bilat to bases, no rales/ wheezing RRR no MRG Abd soft ntnd no mass or ascites +bs GU normal MS no joint effusions or deformity Ext diffuse 2-3+ UE and LE edema, no wounds or ulcers Neuro is alert, Ox 3 , nf         Home meds include - advil, neurontin, percocet prn, zanaflex, spiriva, flonase, prns    Baseline creat from this admission is 0.82- 1.07, eGFR > 60 ml/min.     UA 11/10 - cloudy amber, large H/ LE, 100 prot, many bact, >50 rbc/ wbc    Labs - Na 135 K 3.6  CO2 24 BUN 22 Cr 1.74  Ca 7.4  Alb 1.7  phos 3, Mg 2, lipase 53, AST/ ALT ok, Tbili 0.9, BNP 323       WBC ^ 16 2d ago > 20K yest >> 24K     Total I/O = 18.7L in and 14.6 L UOP = +4.2 L     Wt's 79 or 83 on admit 11/1-0 > peak 83kg prior to today, today 87kg    BP's today 110- 124/61- 70, HR 130's, RR 20 afeb       CT chest / abd  today - IMPRESSION: No evidence of bowel obstruction. The colon rectum are consistent with ileus 2. There is a large, elongated air and fluid collection  in the left hemiabdomen about the left paracolic gutter, low midline abdomen,\ and pelvis, although difficult to exactly measure, at least 25.5 cm in greatest dimension. Findings are consistent with abscess. 3. Status post cystoprostatectomy and right lower quadrant ileal conduit urinary diversion 4. Mild bilateral hydronephrosis. Bilateral double-J ureteral stents in position. The left renal pelvic pigtail is incompletely formed. 5. Small bilateral pleural effusions. 6. Probable mild, underlying pulmonary fibrosis in a bibasilar predominance pattern featuring irregular peripheral interstitial opacity      CT kidneys > Adrenals/Urinary Tract: Adrenal glands are unremarkable. Status post cystoprostatectomy and right lower quadrant ileal conduit urinary diversion. Mild bilateral hydronephrosis. Bilateral double-J ureteral stents in position. The left renal pelvic pigtail is incompletely formed (series 2, image 76).   Assessment/ Plan: AKI - b/l creat 0.82- 1.07, eGFR > 60 ml/min from this admission. Creat up to 1.7 today. In setting of a recent cystectomy/ ileal conduit surgery on 10/27, here now w/ urosepsis d/t Klebs oxytoca. Creat 3 on admission, improved to 0.8 and now is up to 1.7. UA from 11/10 c/w infection. Imaging shows chronic bilat hydro and bilat stents, also showing cystoprostatectomy, ileal conduit. BP's are low-normal, not on BP lowering meds. No nsaid/ contrast or other renal toxins. Has been on IV unasyn. He is quite volume overloaded, CHF can cause AKI.  Will plan repeat UA, get urine lytes and give IV lasix 40 bid. Will follow.  Vol overload SP recent cystectomy w/ ileal conduit - for bladder cancer      Kelly Splinter  MD 11/17/2021, 3:53 PM  Recent Labs  Lab 11/16/21 1201 11/17/21 0521  WBC 20.7* 24.4*  HGB 8.5* 9.6*   Recent Labs  Lab 11/16/21 0527 11/16/21 1201 11/17/21 0521  K 3.2* 3.1* 3.6  BUN $Re'17 19 22  'UtI$ CREATININE 1.07 1.00 1.74*  CALCIUM 7.4* 7.2*  7.4*  PHOS  3.3  --  3.0

## 2021-11-17 NOTE — Progress Notes (Signed)
Pharmacy Antibiotic Note  [name] is a 80 y.o. male admitted on 11/12/2021 with sepsis.  Pharmacy was originally consulted for vancomycin and cefepime dosing > Ceftriaxone with Kleb pneumo BCID > Unasyn with addition of E faecalis to urine culture. Abx change from Unasyn to Zosyn.  Plan: Zosyn 3.375gm q8, 4 hr infusion  Height: 5\' 7"  (170.2 cm) Weight: 87.4 kg (192 lb 10.9 oz) IBW/kg (Calculated) : 66.1  Temp (24hrs), Avg:98.7 F (37.1 C), Min:97.6 F (36.4 C), Max:99.7 F (37.6 C)  Recent Labs  Lab 11/14/21 0313 11/15/21 0527 11/15/21 1508 11/16/21 0527 11/16/21 1201 11/17/21 0521 11/17/21 1020  WBC 16.2* 16.1*  --  19.7* 20.7* 24.4*  --   CREATININE 0.84 0.82 0.83 1.07 1.00 1.74*  --   LATICACIDVEN  --   --   --   --  1.0  --  2.3*     Estimated Creatinine Clearance: 36.3 mL/min (A) (by C-G formula based on SCr of 1.74 mg/dL (H)).    Allergies  Allergen Reactions   Hydrocodone Other (See Comments)    Causes constipation even with stool softner    Antimicrobials this admission: 11/10 Vanc/Flagyl x1 11/10 Cefepime >> 11/11 11/11 CTX >> 11/12 11/13 Unasyn >> 11/18 11/18 Zosy >>  Dose adjustments this admission:  Microbiology results: 11/10 BCx: 3/3 bottles Kleb oxytoca, res to Amp 11/10 UCx: Kleb oxytoca (res to Amp) + E faecalis (pan-sens x3) 11/18 rpt BCx: sent   Thank you for allowing pharmacy to be a part of this patient's care.  Minda Ditto PharmD [facility] [phone] 11/17/2021, 1:34 PM

## 2021-11-17 NOTE — Progress Notes (Signed)
Patient triggered a yellow mews for a heart rate of 118, patient had just ambulated back to bed from the chair and was also having pain from hiccuping, patient settled in bed and a dose of prn thorazine was hung, patient denies and chest pain or shortness of breath, rest of vitals remain stable, yellow mews protocol initiated and on-call provider notified- no new ordered at this time.

## 2021-11-17 NOTE — Progress Notes (Signed)
Un-contained diarrhea, sensitive skin, swollen scrotum .increased risk for skin break down. Cdiff negative, requested rectal tubed. Outcome pending

## 2021-11-18 ENCOUNTER — Inpatient Hospital Stay (HOSPITAL_COMMUNITY): Payer: Medicare HMO

## 2021-11-18 ENCOUNTER — Encounter (HOSPITAL_COMMUNITY): Payer: Self-pay | Admitting: Critical Care Medicine

## 2021-11-18 DIAGNOSIS — R6521 Severe sepsis with septic shock: Secondary | ICD-10-CM | POA: Diagnosis not present

## 2021-11-18 DIAGNOSIS — A419 Sepsis, unspecified organism: Secondary | ICD-10-CM | POA: Diagnosis not present

## 2021-11-18 LAB — BLOOD CULTURE ID PANEL (REFLEXED) - BCID2

## 2021-11-18 LAB — COMPREHENSIVE METABOLIC PANEL
ALT: 17 U/L (ref 0–44)
AST: 30 U/L (ref 15–41)
Albumin: 1.8 g/dL — ABNORMAL LOW (ref 3.5–5.0)
Alkaline Phosphatase: 84 U/L (ref 38–126)
Anion gap: 7 (ref 5–15)
BUN: 31 mg/dL — ABNORMAL HIGH (ref 8–23)
CO2: 24 mmol/L (ref 22–32)
Calcium: 7.2 mg/dL — ABNORMAL LOW (ref 8.9–10.3)
Chloride: 104 mmol/L (ref 98–111)
Creatinine, Ser: 2.56 mg/dL — ABNORMAL HIGH (ref 0.61–1.24)
GFR, Estimated: 25 mL/min — ABNORMAL LOW (ref >60)
Glucose, Bld: 119 mg/dL — ABNORMAL HIGH (ref 70–99)
Potassium: 3.6 mmol/L (ref 3.5–5.1)
Sodium: 135 mmol/L (ref 135–145)
Total Bilirubin: 0.7 mg/dL (ref 0.3–1.2)
Total Protein: 5.6 g/dL — ABNORMAL LOW (ref 6.5–8.1)

## 2021-11-18 LAB — PHOSPHORUS: Phosphorus: 4.5 mg/dL (ref 2.5–4.6)

## 2021-11-18 LAB — GLUCOSE, CAPILLARY
Glucose-Capillary: 105 mg/dL — ABNORMAL HIGH (ref 70–99)
Glucose-Capillary: 94 mg/dL (ref 70–99)
Glucose-Capillary: 95 mg/dL (ref 70–99)

## 2021-11-18 LAB — PROTIME-INR
INR: 1.3 — ABNORMAL HIGH (ref 0.8–1.2)
Prothrombin Time: 16.2 seconds — ABNORMAL HIGH (ref 11.4–15.2)

## 2021-11-18 LAB — CBC
HCT: 21.7 % — ABNORMAL LOW (ref 39.0–52.0)
HCT: 25.4 % — ABNORMAL LOW (ref 39.0–52.0)
Hemoglobin: 6.8 g/dL — CL (ref 13.0–17.0)
Hemoglobin: 8 g/dL — ABNORMAL LOW (ref 13.0–17.0)
MCH: 29.2 pg (ref 26.0–34.0)
MCH: 29.1 pg (ref 26.0–34.0)
MCHC: 31.3 g/dL (ref 30.0–36.0)
MCHC: 31.5 g/dL (ref 30.0–36.0)
MCV: 93.1 fL (ref 80.0–100.0)
MCV: 92.4 fL (ref 80.0–100.0)
Platelets: 282 10*3/uL (ref 150–400)
Platelets: 287 10*3/uL (ref 150–400)
RBC: 2.33 MIL/uL — ABNORMAL LOW (ref 4.22–5.81)
RBC: 2.75 MIL/uL — ABNORMAL LOW (ref 4.22–5.81)
RDW: 15 % (ref 11.5–15.5)
RDW: 14.8 % (ref 11.5–15.5)
WBC: 19.6 10*3/uL — ABNORMAL HIGH (ref 4.0–10.5)
WBC: 18.3 10*3/uL — ABNORMAL HIGH (ref 4.0–10.5)
nRBC: 0 % (ref 0.0–0.2)
nRBC: 0 % (ref 0.0–0.2)

## 2021-11-18 LAB — PREPARE RBC (CROSSMATCH)

## 2021-11-18 LAB — LACTIC ACID, PLASMA: Lactic Acid, Venous: 1 mmol/L (ref 0.5–1.9)

## 2021-11-18 LAB — MAGNESIUM: Magnesium: 2 mg/dL (ref 1.7–2.4)

## 2021-11-18 MED ORDER — FLUCONAZOLE IN SODIUM CHLORIDE 400-0.9 MG/200ML-% IV SOLN
800.0000 mg | Freq: Once | INTRAVENOUS | Status: AC
Start: 2021-11-18 — End: 2021-11-18
  Administered 2021-11-18: 800 mg via INTRAVENOUS
  Filled 2021-11-18: qty 400

## 2021-11-18 MED ORDER — SODIUM CHLORIDE 0.9 % IV SOLN
INTRAVENOUS | Status: AC
  Filled 2021-11-18: qty 250

## 2021-11-18 MED ORDER — CHLORHEXIDINE GLUCONATE 0.12 % MT SOLN
15.0000 mL | Freq: Two times a day (BID) | OROMUCOSAL | Status: DC
  Administered 2021-11-18 – 2021-11-29 (×23): 15 mL via OROMUCOSAL
  Filled 2021-11-18 (×21): qty 15

## 2021-11-18 MED ORDER — POTASSIUM CHLORIDE CRYS ER 20 MEQ PO TBCR: 40.0000 meq | EXTENDED_RELEASE_TABLET | Freq: Once | ORAL | Status: DC

## 2021-11-18 MED ORDER — DIGOXIN 0.25 MG/ML IJ SOLN: 0.2500 mg | Freq: Once | INTRAMUSCULAR | Status: DC

## 2021-11-18 MED ORDER — NALOXONE HCL 0.4 MG/ML IJ SOLN
INTRAMUSCULAR | Status: AC
  Filled 2021-11-18: qty 1

## 2021-11-18 MED ORDER — SODIUM CHLORIDE 0.9% FLUSH
5.0000 mL | Freq: Three times a day (TID) | INTRAVENOUS | Status: DC
  Administered 2021-11-18 – 2021-11-29 (×35): 5 mL

## 2021-11-18 MED ORDER — FLUCONAZOLE IN SODIUM CHLORIDE 200-0.9 MG/100ML-% IV SOLN
200.0000 mg | INTRAVENOUS | Status: DC
  Filled 2021-11-18: qty 100

## 2021-11-18 MED ORDER — SODIUM CHLORIDE 0.9% IV SOLUTION: Freq: Once | INTRAVENOUS | Status: AC

## 2021-11-18 MED ORDER — FENTANYL CITRATE (PF) 100 MCG/2ML IJ SOLN
INTRAMUSCULAR | Status: AC | PRN
Start: 2021-11-18 — End: 2021-11-18
  Administered 2021-11-18: 25 ug via INTRAVENOUS

## 2021-11-18 MED ORDER — ACETAMINOPHEN 325 MG PO TABS
650.0000 mg | ORAL_TABLET | Freq: Four times a day (QID) | ORAL | Status: DC | PRN
  Administered 2021-11-18 – 2021-11-21 (×4): 650 mg via ORAL
  Filled 2021-11-18 (×6): qty 2

## 2021-11-18 MED ORDER — ALBUMIN HUMAN 25 % IV SOLN
12.5000 g | Freq: Once | INTRAVENOUS | Status: AC
  Administered 2021-11-18: 12.5 g via INTRAVENOUS
  Filled 2021-11-18: qty 50

## 2021-11-18 MED ORDER — FLUMAZENIL 0.5 MG/5ML IV SOLN
INTRAVENOUS | Status: AC
  Filled 2021-11-18: qty 5

## 2021-11-18 MED ORDER — FENTANYL CITRATE (PF) 100 MCG/2ML IJ SOLN
INTRAMUSCULAR | Status: AC
  Filled 2021-11-18: qty 4

## 2021-11-18 MED ORDER — ORAL CARE MOUTH RINSE: 15.0000 mL | Freq: Two times a day (BID) | OROMUCOSAL | Status: DC

## 2021-11-18 MED ORDER — POTASSIUM CHLORIDE 10 MEQ/50ML IV SOLN
10.0000 meq | INTRAVENOUS | Status: AC
  Administered 2021-11-18 (×4): 10 meq via INTRAVENOUS
  Filled 2021-11-18 (×4): qty 50

## 2021-11-18 MED ORDER — MIDAZOLAM HCL 2 MG/2ML IJ SOLN
INTRAMUSCULAR | Status: AC
  Filled 2021-11-18: qty 4

## 2021-11-18 NOTE — Progress Notes (Signed)
Temp 102.1. no tylenol ordered. Tachycardic at 140. Informed on call provider

## 2021-11-18 NOTE — Progress Notes (Signed)
Subjective/Chief Complaint: Abdomen still with pain, somnolent, having some flatus   Objective: Vital signs in last 24 hours: Temp:  [97.6 F (36.4 C)-102.1 F (38.9 C)] 98.8 F (37.1 C) (11/19 0829) Pulse Rate:  [120-138] 127 (11/19 0720) Resp:  [11-32] 24 (11/19 0720) BP: (87-158)/(44-84) 109/55 (11/19 0720) SpO2:  [90 %-99 %] 97 % (11/19 0720) Weight:  [89.5 kg-90.1 kg] 89.5 kg (11/19 0500) Last BM Date: 11/17/21  Intake/Output from previous day: 11/18 0701 - 11/19 0700 In: 1128.3 [I.V.:993.1; IV Piggyback:135.2] Out: 1500 [Urine:1500] Intake/Output this shift: Total I/O In: 388.4 [I.V.:208.5; IV Piggyback:180] Out: -   GI: distended with some tenderness, urostomy viable incision clean  Lab Results:  Recent Labs    11/16/21 1201 11/17/21 0521  WBC 20.7* 24.4*  HGB 8.5* 9.6*  HCT 26.9* 30.8*  PLT 314 305   BMET Recent Labs    11/17/21 0521 11/18/21 0158  NA 135 135  K 3.6 3.6  CL 104 104  CO2 24 24  GLUCOSE 126* 119*  BUN 22 31*  CREATININE 1.74* 2.56*  CALCIUM 7.4* 7.2*   PT/INR No results for input(s): LABPROT, INR in the last 72 hours. ABG Recent Labs    11/17/21 0955  PHART 7.435  HCO3 22.4    Studies/Results: CT ABDOMEN PELVIS WO CONTRAST  Result Date: 11/17/2021 CLINICAL DATA:  Partial small bowel obstruction versus ileus, status post laparoscopic cystoprostatectomy and ileal conduit urinary diversion, adhesiolysis, small-bowel resection EXAM: CT CHEST, ABDOMEN AND PELVIS WITHOUT CONTRAST CT LUMBAR SPINE WITHOUT CONTRAST TECHNIQUE: Multidetector CT imaging of the chest, abdomen and pelvis was performed following the standard protocol without IV contrast. Oral enteric contrast was administered Multidetector CT imaging of the lumbar spine was performed following the standard protocol without IV contrast. COMPARISON:  11/02/2021 FINDINGS: CT CHEST FINDINGS Cardiovascular: Right upper extremity PICC. Normal heart size. Three-vessel coronary  artery calcifications. No pericardial effusion. Mediastinum/Nodes: No enlarged mediastinal, hilar, or axillary lymph nodes. Thyroid gland, trachea, and esophagus demonstrate no significant findings. Lungs/Pleura: Mild centrilobular emphysema. Probable mild, underlying pulmonary fibrosis in a bibasilar predominance pattern featuring irregular peripheral interstitial opacity, evaluation limited by the presence of pleural effusions and breath motion artifact. Small bilateral pleural effusions. Musculoskeletal: No chest wall mass or suspicious bone lesions identified. CT ABDOMEN PELVIS FINDINGS Hepatobiliary: No solid liver abnormality is seen. No gallstones, gallbladder wall thickening, or biliary dilatation. Pancreas: Unremarkable. No pancreatic ductal dilatation or surrounding inflammatory changes. Spleen: Normal in size without significant abnormality. Adrenals/Urinary Tract: Adrenal glands are unremarkable. Status post cystoprostatectomy and right lower quadrant ileal conduit urinary diversion. Mild bilateral hydronephrosis. Bilateral double-J ureteral stents in position. The left renal pelvic pigtail is incompletely formed (series 2, image 76). Stomach/Bowel: Stomach is within normal limits. Appendix appears normal. The colon and rectum is diffusely distended and air and fluid-filled, measuring up to 8.5 cm in caliber. Vascular/Lymphatic: No significant vascular findings are present. No enlarged abdominal or pelvic lymph nodes. Reproductive: No mass or other abnormality. Other: Severe anasarca about the lower abdomen and pelvis (series 2, image 117). Subcutaneous emphysema seen on prior examination is almost entirely resolved (series 2, image 114). There is a large, elongated air and fluid collection in the left hemiabdomen about the left paracolic gutter, low midline abdomen, and pelvis, although difficult to exactly measure, at least 25 point by 8.0 x 6.2 cm (series 2, image 103, series 5, image 55, series 2,  image 115). Musculoskeletal: No acute or significant osseous findings. LUMBAR SPINE: Alignment: Normal. Disc  spaces: Status post posterior discectomy and fusion of L2 through S1 with partially incorporated disc spaces. No evidence of perihardware fracture or loosening. Vertebral bodies: Intact. IMPRESSION: 1. No evidence of bowel obstruction. The colon rectum are diffusely distended and air and fluid-filled, consistent with ileus. 2. There is a large, elongated air and fluid collection in the left hemiabdomen about the left paracolic gutter, low midline abdomen, and pelvis, although difficult to exactly measure, at least 25.5 cm in greatest dimension. Findings are consistent with abscess. 3. Status post cystoprostatectomy and right lower quadrant ileal conduit urinary diversion. 4. Mild bilateral hydronephrosis. Bilateral double-J ureteral stents in position. The left renal pelvic pigtail is incompletely formed. 5. Small bilateral pleural effusions. 6. Probable mild, underlying pulmonary fibrosis in a bibasilar predominance pattern featuring irregular peripheral interstitial opacity, evaluation limited by the presence of pleural effusions and breath motion artifact. This could be further evaluated by ILD protocol CT on an outpatient basis when clinically appropriate. 7. Coronary artery disease. 8. Status post posterior discectomy and fusion of L2 through S1 with partially incorporated disc spaces. No evidence of perihardware fracture or loosening. No acute findings of the lumbar spine. These results will be called to the ordering clinician or representative by the Radiologist Assistant, and communication documented in the PACS or [employer]. Electronically Signed   By: Delanna Ahmadi M.D.   On: 11/17/2021 12:00   CT CHEST WO CONTRAST  Result Date: 11/17/2021 CLINICAL DATA:  Partial small bowel obstruction versus ileus, status post laparoscopic cystoprostatectomy and ileal conduit urinary diversion,  adhesiolysis, small-bowel resection EXAM: CT CHEST, ABDOMEN AND PELVIS WITHOUT CONTRAST CT LUMBAR SPINE WITHOUT CONTRAST TECHNIQUE: Multidetector CT imaging of the chest, abdomen and pelvis was performed following the standard protocol without IV contrast. Oral enteric contrast was administered Multidetector CT imaging of the lumbar spine was performed following the standard protocol without IV contrast. COMPARISON:  11/05/2021 FINDINGS: CT CHEST FINDINGS Cardiovascular: Right upper extremity PICC. Normal heart size. Three-vessel coronary artery calcifications. No pericardial effusion. Mediastinum/Nodes: No enlarged mediastinal, hilar, or axillary lymph nodes. Thyroid gland, trachea, and esophagus demonstrate no significant findings. Lungs/Pleura: Mild centrilobular emphysema. Probable mild, underlying pulmonary fibrosis in a bibasilar predominance pattern featuring irregular peripheral interstitial opacity, evaluation limited by the presence of pleural effusions and breath motion artifact. Small bilateral pleural effusions. Musculoskeletal: No chest wall mass or suspicious bone lesions identified. CT ABDOMEN PELVIS FINDINGS Hepatobiliary: No solid liver abnormality is seen. No gallstones, gallbladder wall thickening, or biliary dilatation. Pancreas: Unremarkable. No pancreatic ductal dilatation or surrounding inflammatory changes. Spleen: Normal in size without significant abnormality. Adrenals/Urinary Tract: Adrenal glands are unremarkable. Status post cystoprostatectomy and right lower quadrant ileal conduit urinary diversion. Mild bilateral hydronephrosis. Bilateral double-J ureteral stents in position. The left renal pelvic pigtail is incompletely formed (series 2, image 76). Stomach/Bowel: Stomach is within normal limits. Appendix appears normal. The colon and rectum is diffusely distended and air and fluid-filled, measuring up to 8.5 cm in caliber. Vascular/Lymphatic: No significant vascular findings are  present. No enlarged abdominal or pelvic lymph nodes. Reproductive: No mass or other abnormality. Other: Severe anasarca about the lower abdomen and pelvis (series 2, image 117). Subcutaneous emphysema seen on prior examination is almost entirely resolved (series 2, image 114). There is a large, elongated air and fluid collection in the left hemiabdomen about the left paracolic gutter, low midline abdomen, and pelvis, although difficult to exactly measure, at least 25 point by 8.0 x 6.2 cm (series 2, image 103, series 5,  image 55, series 2, image 115). Musculoskeletal: No acute or significant osseous findings. LUMBAR SPINE: Alignment: Normal. Disc spaces: Status post posterior discectomy and fusion of L2 through S1 with partially incorporated disc spaces. No evidence of perihardware fracture or loosening. Vertebral bodies: Intact. IMPRESSION: 1. No evidence of bowel obstruction. The colon rectum are diffusely distended and air and fluid-filled, consistent with ileus. 2. There is a large, elongated air and fluid collection in the left hemiabdomen about the left paracolic gutter, low midline abdomen, and pelvis, although difficult to exactly measure, at least 25.5 cm in greatest dimension. Findings are consistent with abscess. 3. Status post cystoprostatectomy and right lower quadrant ileal conduit urinary diversion. 4. Mild bilateral hydronephrosis. Bilateral double-J ureteral stents in position. The left renal pelvic pigtail is incompletely formed. 5. Small bilateral pleural effusions. 6. Probable mild, underlying pulmonary fibrosis in a bibasilar predominance pattern featuring irregular peripheral interstitial opacity, evaluation limited by the presence of pleural effusions and breath motion artifact. This could be further evaluated by ILD protocol CT on an outpatient basis when clinically appropriate. 7. Coronary artery disease. 8. Status post posterior discectomy and fusion of L2 through S1 with partially  incorporated disc spaces. No evidence of perihardware fracture or loosening. No acute findings of the lumbar spine. These results will be called to the ordering clinician or representative by the Radiologist Assistant, and communication documented in the PACS or [employer]. Electronically Signed   By: Delanna Ahmadi M.D.   On: 11/17/2021 12:00   CT LUMBAR SPINE WO CONTRAST  Result Date: 11/17/2021 CLINICAL DATA:  Partial small bowel obstruction versus ileus, status post laparoscopic cystoprostatectomy and ileal conduit urinary diversion, adhesiolysis, small-bowel resection EXAM: CT CHEST, ABDOMEN AND PELVIS WITHOUT CONTRAST CT LUMBAR SPINE WITHOUT CONTRAST TECHNIQUE: Multidetector CT imaging of the chest, abdomen and pelvis was performed following the standard protocol without IV contrast. Oral enteric contrast was administered Multidetector CT imaging of the lumbar spine was performed following the standard protocol without IV contrast. COMPARISON:  11/24/2021 FINDINGS: CT CHEST FINDINGS Cardiovascular: Right upper extremity PICC. Normal heart size. Three-vessel coronary artery calcifications. No pericardial effusion. Mediastinum/Nodes: No enlarged mediastinal, hilar, or axillary lymph nodes. Thyroid gland, trachea, and esophagus demonstrate no significant findings. Lungs/Pleura: Mild centrilobular emphysema. Probable mild, underlying pulmonary fibrosis in a bibasilar predominance pattern featuring irregular peripheral interstitial opacity, evaluation limited by the presence of pleural effusions and breath motion artifact. Small bilateral pleural effusions. Musculoskeletal: No chest wall mass or suspicious bone lesions identified. CT ABDOMEN PELVIS FINDINGS Hepatobiliary: No solid liver abnormality is seen. No gallstones, gallbladder wall thickening, or biliary dilatation. Pancreas: Unremarkable. No pancreatic ductal dilatation or surrounding inflammatory changes. Spleen: Normal in size without  significant abnormality. Adrenals/Urinary Tract: Adrenal glands are unremarkable. Status post cystoprostatectomy and right lower quadrant ileal conduit urinary diversion. Mild bilateral hydronephrosis. Bilateral double-J ureteral stents in position. The left renal pelvic pigtail is incompletely formed (series 2, image 76). Stomach/Bowel: Stomach is within normal limits. Appendix appears normal. The colon and rectum is diffusely distended and air and fluid-filled, measuring up to 8.5 cm in caliber. Vascular/Lymphatic: No significant vascular findings are present. No enlarged abdominal or pelvic lymph nodes. Reproductive: No mass or other abnormality. Other: Severe anasarca about the lower abdomen and pelvis (series 2, image 117). Subcutaneous emphysema seen on prior examination is almost entirely resolved (series 2, image 114). There is a large, elongated air and fluid collection in the left hemiabdomen about the left paracolic gutter, low midline abdomen, and pelvis, although  difficult to exactly measure, at least 25 point by 8.0 x 6.2 cm (series 2, image 103, series 5, image 55, series 2, image 115). Musculoskeletal: No acute or significant osseous findings. LUMBAR SPINE: Alignment: Normal. Disc spaces: Status post posterior discectomy and fusion of L2 through S1 with partially incorporated disc spaces. No evidence of perihardware fracture or loosening. Vertebral bodies: Intact. IMPRESSION: 1. No evidence of bowel obstruction. The colon rectum are diffusely distended and air and fluid-filled, consistent with ileus. 2. There is a large, elongated air and fluid collection in the left hemiabdomen about the left paracolic gutter, low midline abdomen, and pelvis, although difficult to exactly measure, at least 25.5 cm in greatest dimension. Findings are consistent with abscess. 3. Status post cystoprostatectomy and right lower quadrant ileal conduit urinary diversion. 4. Mild bilateral hydronephrosis. Bilateral double-J  ureteral stents in position. The left renal pelvic pigtail is incompletely formed. 5. Small bilateral pleural effusions. 6. Probable mild, underlying pulmonary fibrosis in a bibasilar predominance pattern featuring irregular peripheral interstitial opacity, evaluation limited by the presence of pleural effusions and breath motion artifact. This could be further evaluated by ILD protocol CT on an outpatient basis when clinically appropriate. 7. Coronary artery disease. 8. Status post posterior discectomy and fusion of L2 through S1 with partially incorporated disc spaces. No evidence of perihardware fracture or loosening. No acute findings of the lumbar spine. These results will be called to the ordering clinician or representative by the Radiologist Assistant, and communication documented in the PACS or [employer]. Electronically Signed   By: Delanna Ahmadi M.D.   On: 11/17/2021 12:00   DG CHEST PORT 1 VIEW  Result Date: 11/17/2021 CLINICAL DATA:  Back pain, weakness, short of breath EXAM: PORTABLE CHEST 1 VIEW COMPARISON:  11/10/2021 FINDINGS: Central line removed.  NG removed. Mild right lower lobe airspace disease unchanged Improvement in left lower lobe airspace disease and small left effusion. Negative for pulmonary edema. IMPRESSION: Mild improvement in left lower lobe airspace disease and left effusion. No change in right lower lobe airspace disease. Electronically Signed   By: Franchot Gallo M.D.   On: 11/17/2021 10:50   DG Abd Portable 1V-Small Bowel Obstruction Protocol-initial, 8 hr delay  Result Date: 11/16/2021 CLINICAL DATA:  Small bowel protocol, 8 hour postcontrast film. EXAM: PORTABLE ABDOMEN - 1 VIEW COMPARISON:  Abdominal x-ray 11/16/2021, 9:36 a.m. FINDINGS: No significant oral contrast identified. Diffusely air-filled dilated large and small bowel appears similar to the prior study. Ureteral stents are unchanged in position. Catheters overlie the right abdomen. Lumbar fusion  hardware is unchanged. There are no suspicious calcifications. No acute fractures are identified. IMPRESSION: 1. No oral contrast visualized. Stable diffuse small and large bowel dilatation. Electronically Signed   By: Ronney Asters M.D.   On: 11/16/2021 23:05   DG Abd Portable 1V  Result Date: 11/16/2021 CLINICAL DATA:  Small-bowel obstruction EXAM: PORTABLE ABDOMEN - 1 VIEW COMPARISON:  KUB 11/15/2021 FINDINGS: There is significant gaseous distention of the small and large bowel, grossly similar to the study dated 1 day prior. There is no definite free intraperitoneal air, though this is suboptimally evaluated given supine technique. Bilateral ureteral stents are again seen which course over the right hemiabdomen, unchanged. Lumbar spine fusion hardware is again seen. IMPRESSION: Overall no significant interval change in degree of gaseous distention of the small and large bowel compared to the study from 1 day prior. Electronically Signed   By: Valetta Mole M.D.   On: 11/16/2021 10:35  [place] EKG SITE RITE  Result Date: 11/16/2021 If Site Rite image not attached, placement could not be confirmed due to current cardiac rhythm.   Anti-infectives: Anti-infectives (From admission, onward)    Start     Dose/Rate Route Frequency Ordered Stop   11/17/21 1600  piperacillin-tazobactam (ZOSYN) IVPB 3.375 g        3.375 g 12.5 mL/hr over 240 Minutes Intravenous Every 8 hours 11/17/21 1333     11/12/21 0800  Ampicillin-Sulbactam (UNASYN) 3 g in sodium chloride 0.9 % 100 mL IVPB  Status:  Discontinued        3 g 200 mL/hr over 30 Minutes Intravenous Every 6 hours 11/12/21 0712 11/17/21 1333   11/10/21 1400  cefTRIAXone (ROCEPHIN) 2 g in sodium chloride 0.9 % 100 mL IVPB  Status:  Discontinued        2 g 200 mL/hr over 30 Minutes Intravenous Daily 11/10/21 1048 11/12/21 0712   11/10/21 0800  ceFEPIme (MAXIPIME) 2 g in sodium chloride 0.9 % 100 mL IVPB  Status:  Discontinued        2 g 200 mL/hr over 30  Minutes Intravenous Every 12 hours 11/10/21 0618 11/10/21 1048   11/10/21 0630  ceFEPIme (MAXIPIME) 2 g in sodium chloride 0.9 % 100 mL IVPB  Status:  Discontinued        2 g 200 mL/hr over 30 Minutes Intravenous Every 24 hours 11/25/2021 1622 11/10/21 0618   11/19/2021 1624  vancomycin variable dose per unstable renal function (pharmacist dosing)  Status:  Discontinued         Does not apply See admin instructions 11/25/2021 1624 11/10/21 0613   11/11/2021 1030  ceFEPIme (MAXIPIME) 2 g in sodium chloride 0.9 % 100 mL IVPB        2 g 200 mL/hr over 30 Minutes Intravenous  Once 11/24/2021 1016 11/14/2021 1133   11/12/2021 1030  metroNIDAZOLE (FLAGYL) IVPB 500 mg        500 mg 100 mL/hr over 60 Minutes Intravenous  Once 11/24/2021 1016 11/17/2021 1915   11/21/2021 1030  vancomycin (VANCOCIN) IVPB 1000 mg/200 mL premix  Status:  Discontinued        1,000 mg 200 mL/hr over 60 Minutes Intravenous  Once 11/06/2021 1016 11/04/2021 1023   11/12/2021 1030  vancomycin (VANCOREADY) IVPB 1500 mg/300 mL        1,500 mg 150 mL/hr over 120 Minutes Intravenous  Once 11/04/2021 1023 11/08/2021 1341       Assessment/Plan: pSBO  vs ileus, IAA for drainage today - s/p Robotic-assisted laparoscopic radical cystectomy, radical prostatectomy, Bilateral pelvic lymph node dissection, Extensive laparoscopic adhesiolysis, Small bowel resection, Ileal conduit urinary diversion 10/25/21 [name] - CT scan 11/10 with SBO pattern, possibly a pSBO at the level of the surgical anastomosis  - seemed to be clinically improving, NG removed 11/14 PM, started on CLD, developed emesis 11/15.  - due for IR drain today, urology following- will be available as needed ID - maxipime/ flagyl/ vancomycin 11/10, switched to Rocephin then Unasyn based on urine cx per primary team VTE - SCDs, sq heparin FEN - npo Foley - none. Urostomy/Ileal conduit    Septic shock - resolved Klebsiella Oxytoca UTI  Klebsiella oxytoca bacteremia Asthma/ COPD Chronic back  pain HLD AKI  Rolm Bookbinder 11/18/2021

## 2021-11-18 NOTE — Procedures (Signed)
Interventional Radiology Procedure Note  Procedure: Image guided drain placement, LLQ.  37F pigtail drain. ~350cc of thin, yellow, turbid fluid. Sample sent  Complications: None  EBL: None Sample: Culture sent  Recommendations: - Routine drain care, with sterile flushes, record output - follow up Cx - routine wound care  Signed,  Dulcy Fanny. Earleen Newport, DO

## 2021-11-18 NOTE — Consult Note (Signed)
[name]    Date of Admission:  11/02/2021   Total days of inpatient antibiotics 20        Reason for Consult: Yeast in blood    Principal Problem:   Septic shock ([place]) Active Problems:   Chronic pain   Bladder cancer ([place])   Bacteremia due to Gram-negative bacteria   UTI due to Klebsiella species   Assessment: 80 year old male with invasive bladder cancer  treated with chemotherapy and status post robotic laparoscopy with cystectomy/prostatectomy/adhesiolysis with small bowel resection and ileal conduit diversion on 10/26 admitted from urologist office due to left lower quadrant pain and constipation.  Found to have Klebsiella oxytoca bacteremia secondary to urinary source which was treated with Unasyn.  ID consulted blood cultures grew positive for yeast  CT on 11/18 showed 25 cm intra-abdominal fluid and air collection consistent with an abscess.   #Candida albicans fungemia likely 2/2  abdominal abscess -Pt underwent abdominal drain placement on 11/19 with  Turbid fluid returned, Cx sent -started on fluconazole -PICC was placed at 1521, blood Cx were drawn earlier in the day -Suspect source of fungemia is abdominal abscess, will work up other etiologies.   Recommendations:  -D/C fluconazole -Start Anidulafungin -Ok to continue pip-tazo as Cx mature in the setting of abdominal abscess -Agree with holding TPN for now -Repeat blood Cx and order fungal blood Cx -Obtain Urine Cx -Follow blood and abdominal aspirate Cx(added fungal Cx+stain) -Replace lines including PICC -Opthalmology consult -TTE -Swab lip lesion for HSV   #Klebsiella oxytoca  bacteremia 2/2 UTI -pip-tazo as above for complicate UTI Microbiology:   Antibiotics: Cefepime, metro an vavcomyin 11/10 Ceftriaxone 11/11-12 Unasyn 11/13-15 Pip-tazo 11/18-p Fluconazole 11/18 Cultures: Blood: 11/10 2/2 klebsiella oxytoca 11/14 2/2 yeast 11/10 Urine Cx+ Klebsiella  oxytoca and E faecalis  HPI: William [name] is a 80 y.o. male with invasive bladder cancer SP robotic lap with radical cystectomy/prostatectomy/adhesiolysis, small bowel resection and ileal conduit diversion of 10/26 with [name], Urology. He had received chemotherapy with gem-cis(curative path), discharged POD 8.  Since 11/5 pt starting having fever and LLQ pain and constipation. Presented from the Urology office with LLQ pain, sent to ED. Found to have wbc 7K, hypotensive and started on levophed. Ct showed  concerning for SBO, small amount of free intraperitoneal air. Found to klebsiella  bacteremia 2/2 urinary source treated with unasyn. He was clinically improving NG removed on 11/14->clear liquid diet->developed emesis. On 11/18 CT showed 25cm air fluid collection consistent with abscess. Unasyn was switched to pip-tazo. Started on TPN 11/16/21. ID engaged as blood Cx from 11/7 + yeast with candida albicans on BCID. Pt is resting in bed. Complains of dry mouth.    Review of Systems: Review of Systems  All other systems reviewed and are negative.  Past Medical History:  Diagnosis Date   Bladder cancer ([place])    Pt says small tumor not cancer   Bleeding gums 11/11/2015   COVID 09/23/2020   back ache hurt all over x few days all symptoms reolved   Dyslipidemia (high LDL; low HDL)    Low back pain 12/27/2014   Medial epicondylitis of right elbow 12/13/2014   Numbness and tingling of right leg    Tinnitus    all the time   Toenail fungus    2 big toes   Wears glasses     Social History   Tobacco Use   Smoking status: Former  Packs/day: 1.50    Years: 30.00    Pack years: 45.00    Types: Cigarettes    Quit date: 04/02/1986    Years since quitting: 35.6   Smokeless tobacco: Never   Tobacco comments:    no plans to start  Vaping Use   Vaping Use: Never used  Substance Use Topics   Alcohol use: Not Currently   Drug use: Never    Family History  Problem Relation Age of  Onset   Stroke Mother    Heart disease Father    Stroke Father    Diabetes Father    Obesity Brother    Obesity Brother    Cancer Maternal Grandfather    Cancer Maternal Grandmother    Drug abuse Daughter    Scheduled Meds:  sodium chloride   Intravenous Once   chlorhexidine  15 mL Mouth Rinse BID   Chlorhexidine Gluconate Cloth  6 each Topical Daily   feeding supplement  237 mL Oral TID BM   fluticasone  2 spray Each Nare Daily   heparin  5,000 Units Subcutaneous Q8H   insulin aspart  0-9 Units Subcutaneous Q6H   lip balm  1 application Topical BID   magic mouthwash w/lidocaine  10 mL Oral QID   mouth rinse  15 mL Mouth Rinse q12n4p   nystatin  5 mL Oral QID   nystatin   Topical BID   simethicone  80 mg Oral QID   sodium chloride flush  10-40 mL Intracatheter Q12H   sodium chloride flush  10-40 mL Intracatheter Q12H   sodium chloride flush  5 mL Intracatheter Q8H   umeclidinium bromide  1 puff Inhalation Daily   Continuous Infusions:  sodium chloride 75 mL/hr at 11/14/21 2328   chlorproMAZINE (THORAZINE) IV Stopped (11/18/21 0030)   [START ON 11/19/2021] fluconazole (DIFLUCAN) IV     methocarbamol (ROBAXIN) IV Stopped (11/18/21 0720)   ondansetron (ZOFRAN) IV     piperacillin-tazobactam (ZOSYN)  IV Stopped (11/18/21 1245)   sodium chloride     TPN ADULT (ION) 40 mL/hr at 11/17/21 1848   PRN Meds:.acetaminophen, chlorproMAZINE (THORAZINE) IV, diphenhydrAMINE, HYDROmorphone (DILAUDID) injection, magic mouthwash, menthol-cetylpyridinium, methocarbamol (ROBAXIN) IV, ondansetron (ZOFRAN) IV **OR** ondansetron (ZOFRAN) IV, phenol, sodium chloride flush, sodium chloride flush Allergies  Allergen Reactions   Hydrocodone Other (See Comments)    Causes constipation even with stool softner    OBJECTIVE: Blood pressure (!) 108/57, pulse (!) 114, temperature 98.6 F (37 C), temperature source Axillary, resp. rate 20, height 5\' 7"  (1.702 m), weight 89.5 kg, SpO2 97 %.  Physical  Exam Constitutional:      General: He is not in acute distress.    Appearance: He is not toxic-appearing.  HENT:     Head: Normocephalic and atraumatic.     Right Ear: External ear normal.     Left Ear: External ear normal.     Nose: No congestion or rhinorrhea.     Mouth/Throat:     Mouth: Mucous membranes are dry.     Comments: Lesion on lip about 1 cm Eyes:     Extraocular Movements: Extraocular movements intact.     Conjunctiva/sclera: Conjunctivae normal.     Pupils: Pupils are equal, round, and reactive to light.  Cardiovascular:     Rate and Rhythm: Normal rate and regular rhythm.     Heart sounds: No murmur heard.   No friction rub. No gallop.  Pulmonary:     Effort: Pulmonary effort is normal.  Breath sounds: Normal breath sounds.  Abdominal:     General: Abdomen is flat. Bowel sounds are normal.     Palpations: Abdomen is soft.     Comments: Distended Midline surgical wound LLQ drain ostomy  Musculoskeletal:        General: No swelling. Normal range of motion.     Cervical back: Normal range of motion and neck supple.  Skin:    General: Skin is warm and dry.  Neurological:     General: No focal deficit present.     Mental Status: He is oriented to person, place, and time.  Psychiatric:        Mood and Affect: Mood normal.    Lab Results Lab Results  Component Value Date   WBC 19.6 (H) 11/18/2021   HGB 6.8 (LL) 11/18/2021   HCT 21.7 (L) 11/18/2021   MCV 93.1 11/18/2021   PLT 282 11/18/2021    Lab Results  Component Value Date   CREATININE 2.56 (H) 11/18/2021   BUN 31 (H) 11/18/2021   NA 135 11/18/2021   K 3.6 11/18/2021   CL 104 11/18/2021   CO2 24 11/18/2021    Lab Results  Component Value Date   ALT 17 11/18/2021   AST 30 11/18/2021   ALKPHOS 84 11/18/2021   BILITOT 0.7 11/18/2021       Laurice Record, [place] [facility] 11/18/2021, 1:11 PM

## 2021-11-18 NOTE — Progress Notes (Signed)
Oncall coverage instructed to give 6am lasix now

## 2021-11-18 NOTE — Progress Notes (Addendum)
PROGRESS NOTE    William [name]  STM:[id number] DOB: 1941-03-30 DOA: 11/06/2021 PCP: [name], MD    Brief Narrative:  William [name] is a 80 year old male with past medical history significant for bladder cancer s/p robotic assisted laparoscopic radical cystectomy/prostatectomy on 10/25/21 by [name], bilateral pelvic lymph node dissection, extensive laparoscopic adhesiolysis, small bowel resection with ileal conduit, urinary diversion 10/26/21 by [name] (discharged by urology on post op day#8 11/02/21), COPD, history of tobacco use disorder who presented to [facility] ED on 11/03/2021 from urology clinic with finding of low blood pressure with SBP in the 50's.  Work-up revealed septic shock secondary to Klebsiella oxytoca UTI and bacteremia, POA was placed on vasopressors and admitted to the ICU.  Care transferred to [facility], Hospitalist service, on 11/11/2021.  Hospital course complicated by ileus/partial small bowel obstruction for which he is followed by general surgery.  Anasarca and worsening renal failure for which nephrology was consulted.  He also developed a large intra-abdominal abscess measuring 25.5 cm seen on CT scan taken on 11/17/2021.  Due to worsening leukocytosis and tachycardia, blood cultures were obtained, 1 out of 2 bottles positive for Candida albicans.  Started on IV Diflucan.  Infectious disease and ophthalmology have been consulted.  The intra-abdominal abscess has been drained by interventional radiology [name] on 11/18/2021.  11/18/2021: Patient was seen and examined at his bedside.  Reports hurting all over.  His mouth is very dry, oral care in place.  He is on full liquid diet per surgery.  Assessment & Plan:   Principal Problem:   Septic shock ([place]) Active Problems:   Chronic pain   Bladder cancer ([place])   Bacteremia due to Gram-negative bacteria   UTI due to Klebsiella species  Septic shock, now septic, not on vasopressor, secondary to Klebsiella  oxytoca UTI/Klebsiella oxytoca bacteremia, newly diagnosed Candida albicans fungemia Patient presenting to ED from urology clinic after being found with systolic blood pressures in the 50s.  Received Levophed vasopressor, and was admitted to the ICU.   Source of infection urinary tract.   Patient was started on empiric antibiotics initially with vancomycin, cefepime, metronidazole and was de-escalated to ceftriaxone based on cultures, then later de-escalated to Unasyn. He has been off vasopressors and has maintained a MAP greater than 65. WBC is downtrending 19,000 from 24,000. Blood cultures obtained on 11/17/2021 positive for Candida albicans.  Infectious disease and ophthalmology consulted. Procalcitonin down trended 0.74, peaked at 41.64 on 11/26/2021. --Blood cultures x2: Klebsiella oxytoca --Urine culture > 100,000 colonies of Klebsiella oxytoca/Enterococcus faecalis --Unasyn 3g IV q6h, plan 7-day course  Intra-abdominal abscess seen on CT scan measuring 25.5 cm status post image guided drain placement left lower quadrant by interventional radiology [name] on 11/18/2021. Switch antibiotic to Zosyn 11/17/21 Continue to closely monitor WBC and fever curve.  Newly diagnosed Candida albicans fungemia From blood cultures drawn on 11/17/2021 IV Diflucan started on 11/18/2021 Infectious disease consulted Ophthalmology also consulted for eye exam.  Small bowel obstruction, now ileus/partial small bowel obstruction. CT abdomen/pelvis on admission with concern for small bowel obstruction with possible transition point in the lower abdomen/pelvis near surgical anastomosis site. NG tube removed on 11/14/2021. --General surgery/urology following, appreciate assistance Is currently on full liquid diet. Continue to optimize magnesium and potassium levels. Continue to mobilize as tolerated. Goal potassium greater than 4.0 Goal magnesium greater than 2.0. Continue to mobilize as tolerated CT  abdomen pelvis ordered, follow results.  Anasarca suspect secondary to hypoalbuminemia, acute  CHF IV albumin 12.5 mg every 6 hours x2 doses. Nephrology consulted to assist with the management Management per nephrology.  Bilateral pleural effusion secondary to volume overload Has received IV Lasix Continue incentive spirometer and flutter valve Continue to mobilize as tolerated Appreciate nephrology's assistance. CT chest ordered, personally reviewed showing bilateral pleural effusions with bilateral atelectasis.  Sinus tachycardia suspect related to sepsis and ongoing lung physiology HR 120-130's Continue to treat underlying conditions PRN Lopressor 2.5 mg q6H if HR is consistently>130  Nonoliguric AKI suspect secondary to intravascular volume depletion Etiology likely secondary to prolonged prerenal state from  --Cr 2.87>3.00>1.67>1.21>1.04>0.90>0.86> 1.74. Off IV fluid IV albumin 12.5 mg every 6 hours x2 doses. TPN started on 11/16/2021, held on 11/18/2021 due to candidemia.  Severe left flank/back pain secondary to intra-abdominal large abscess seen on CT scan. At baseline on gabapentin, oxycodone, Zanaflex. No acute findings on CT lumbar spine. Continue pain control with IV narcotics as needed for severe pain  Acute on chronic diastolic CHF, likely exacerbated by volume overload BNP 323 Bilateral pleural effusions Mild pulmonary edema Continue strict I's and O's and daily weight  Oral thrush Continue IV Diflucan Continue oral care  Resolved refractory hypokalemia Potassium 3.6 from 3.1 goal potassium greater than 4.0 in the setting of bowel obstruction. Magnesium level at goal 2.0. Replete electrolytes as indicated.  Bladder cancer s/p radical cystectomy, prostatectomy and ileal conduit by urology [name] on 10/25/2021, and 10/26/2021. Underwent robotic assisted laparoscopic radical cystectomy, robotic radical prostatectomy, bilateral pelvic lymph node  dissection, extensive laparoscopic adhesiolysis, small bowel resection and ileal conduit urinary diversion on 10/25/2021 and on 10/26/2021 by [name]. --Urology following, appreciate assistance Continue ostomy care;  Continue to monitor urinary output  COPD History of tobacco abuse --Incruse Ellipta 1 puff daily --DuoNebs as needed --Currently on room air Maintain O2 saturation greater than 90%.  Normocytic anemia  Chemotherapy 8 months ago 9.2>9.5>9.2>8.8>9.4>9.2>8.9> 8.3> 9.6. --Transfuse for hemoglobin less than 7.0 or active bleeding Hemoglobin is downtrending Will obtain iron studies, follow results, IV Feraheme if low.  Suspected ILD Noted on CT imaging this admission.  History of tobacco abuse. --Flonase --Outpatient pulmonology follow-up  Weakness/deconditioning/debility: --PT/OT recommending home health on discharge. Continue to mobilize as tolerated Continue fall precautions.  Severe protein calorie malnutrition. Albumin 1.7 on 11/17/2021 Moderate muscle mass loss. Encourage oral intake as tolerated when not NPO. Oral supplement when can tolerate p.o.   Critical care time: 65 minutes.   DVT prophylaxis: heparin injection 5,000 Units Start: 11/28/2021 1515 SCDs Start: 11/14/2021 1502   Code Status: Full Code Family Communication: Updated family present at bedside this morning  Disposition Plan:  Level of care: Stepdown, Transfer to stepdown unit on 11/17/21. Status is: Inpatient  Remains inpatient appropriate because: Continues on IV antibiotics, NG tube removed today, general surgery advancing diet, needs to demonstrate toleration of diet before able discharge home.   Consultants:  PCCM - signed off 11/12 General surgery Urology  Procedures:  Central line placement 11/10  Antimicrobials:  Ceftriaxone 11/11 - 11/13 Vancomycin 11/10 - 11/11 Cefepime 11/10 - 11/11 Metronidazole 11/10 - 11/10 Unasyn 11/13>>     Objective: Vitals:   11/18/21  1100 11/18/21 1251 11/18/21 1300 11/18/21 1328  BP: (!) 108/57  (!) 113/56 115/71  Pulse: (!) 114  (!) 109 (!) 114  Resp: 20  13 (!) 24  Temp:  98.6 F (37 C)  98.1 F (36.7 C)  TempSrc:  Axillary  Oral  SpO2: 97%  100% 98%  Weight:      Height:        Intake/Output Summary (Last 24 hours) at 11/18/2021 1334 Last data filed at 11/18/2021 1103 Gross per 24 hour  Intake 1516.71 ml  Output 1850 ml  Net -333.29 ml   Filed Weights   11/15/21 0500 11/17/21 1741 11/18/21 0500  Weight: 87.4 kg 90.1 kg 89.5 kg    Examination:  General exam: Frail-appearing in no acute distress.  He is alert and oriented x3.   Respiratory system: Mild rales at bases with no wheezing noted.  Poor inspiratory effort.   Cardiovascular system: Tachycardic with no rubs or gallops.  No JVD or thyromegaly noted.   Gastrointestinal system: Mildly tender at umbilicus region.  Mildly distended.  Hypoactive bowel sounds present.   Central nervous system: Alert and awake.  No focal neurological deficits.  He follows commands appropriately.  Moves all 4 extremities equally. Extremities: Anasarca, 2+ pitting edema in lower and upper extremities bilaterally.   Skin: No rashes or ulcerative lesions noted.   Psychiatry: Mood is appropriate for condition and setting.    Data Reviewed: I have personally reviewed following labs and imaging studies  CBC: Recent Labs  Lab 11/15/21 0527 11/16/21 0527 11/16/21 1201 11/17/21 0521 11/18/21 0850  WBC 16.1* 19.7* 20.7* 24.4* 19.6*  NEUTROABS  --   --  17.7*  --   --   HGB 8.3* 8.6* 8.5* 9.6* 6.8*  HCT 26.0* 26.9* 26.9* 30.8* 21.7*  MCV 90.9 92.1 93.1 93.6 93.1  PLT 283 323 314 305 073   Basic Metabolic Panel: Recent Labs  Lab 11/12/21 0319 11/13/21 0319 11/14/21 0313 11/15/21 0527 11/15/21 1508 11/16/21 0527 11/16/21 1201 11/17/21 0521 11/18/21 0158  NA 142   < > 141   < > 137 138 136 135 135  K 3.8   < > 3.3*   < > 3.5 3.2* 3.1* 3.6 3.6  CL 105   < > 106    < > 103 106 104 104 104  CO2 27   < > 27   < > 25 25 25 24 24   GLUCOSE 105*   < > 119*   < > 108* 95 93 126* 119*  BUN 26*   < > 22   < > 17 17 19 22  31*  CREATININE 1.04   < > 0.84   < > 0.83 1.07 1.00 1.74* 2.56*  CALCIUM 7.9*   < > 7.5*   < > 7.4* 7.4* 7.2* 7.4* 7.2*  MG 2.4  --  2.1  --  2.0 1.9  --  2.1 2.0  PHOS 3.0  --   --   --   --  3.3  --  3.0  --    < > = values in this interval not displayed.   GFR: Estimated Creatinine Clearance: 25 mL/min (A) (by C-G formula based on SCr of 2.56 mg/dL (H)). Liver Function Tests: Recent Labs  Lab 11/16/21 1201 11/17/21 0521 11/18/21 0158  AST 25 34 30  ALT 16 19 17   ALKPHOS 86 96 84  BILITOT 0.9 0.8 0.7  PROT 5.2* 5.5* 5.6*  ALBUMIN 1.7* 1.7* 1.8*   Recent Labs  Lab 11/17/21 1020  LIPASE 53*   No results for input(s): AMMONIA in the last 168 hours. Coagulation Profile: No results for input(s): INR, PROTIME in the last 168 hours.  Cardiac Enzymes: No results for input(s): CKTOTAL, CKMB, CKMBINDEX, TROPONINI in the last 168 hours. BNP (last 3 results) No  results for input(s): PROBNP in the last 8760 hours. HbA1C: No results for input(s): HGBA1C in the last 72 hours. CBG: Recent Labs  Lab 11/17/21 1436 11/17/21 1744 11/17/21 2331 11/18/21 0533 11/18/21 1159  GLUCAP 117* 102* 103* 105* 94   Lipid Profile: Recent Labs    11/17/21 0521  TRIG 98   Thyroid Function Tests: No results for input(s): TSH, T4TOTAL, FREET4, T3FREE, THYROIDAB in the last 72 hours. Anemia Panel: Recent Labs    11/15/21 1920 11/15/21 2106  FERRITIN 517*  --   TIBC 168*  --   IRON 25*  --   RETICCTPCT  --  2.7   Sepsis Labs: Recent Labs  Lab 11/13/21 0319 11/14/21 0313 11/16/21 1201 11/17/21 0521 11/17/21 1020 11/18/21 0229  PROCALCITON 4.67 2.62  --  0.74  --   --   LATICACIDVEN  --   --  1.0  --  2.3* 1.0    Recent Results (from the past 240 hour(s))  Blood Culture (routine x 2)     Status: Abnormal   Collection Time:  11/28/2021 10:16 AM   Specimen: BLOOD RIGHT ARM  Result Value Ref Range Status   Specimen Description BLOOD RIGHT ARM  Final   Special Requests   Final    BOTTLES DRAWN AEROBIC AND ANAEROBIC Blood Culture adequate volume   Culture  Setup Time   Final    GRAM NEGATIVE RODS IN BOTH AEROBIC AND ANAEROBIC BOTTLES CRITICAL RESULT CALLED TO, READ BACK BY AND VERIFIED WITH: M LILLISTON,PHARMD@0544  11/10/21 [place] Performed at [facility], [place] [address], [place], [place] [address]    Culture KLEBSIELLA OXYTOCA (A)  Final   Report Status 11/12/2021 FINAL  Final   Organism ID, Bacteria KLEBSIELLA OXYTOCA  Final      Susceptibility   Klebsiella oxytoca - MIC*    AMPICILLIN >=32 RESISTANT Resistant     CEFAZOLIN 8 SENSITIVE Sensitive     CEFEPIME <=0.12 SENSITIVE Sensitive     CEFTAZIDIME <=1 SENSITIVE Sensitive     CEFTRIAXONE <=0.25 SENSITIVE Sensitive     CIPROFLOXACIN <=0.25 SENSITIVE Sensitive     GENTAMICIN <=1 SENSITIVE Sensitive     IMIPENEM <=0.25 SENSITIVE Sensitive     TRIMETH/SULFA <=20 SENSITIVE Sensitive     AMPICILLIN/SULBACTAM 8 SENSITIVE Sensitive     PIP/TAZO <=4 SENSITIVE Sensitive     * KLEBSIELLA OXYTOCA  Urine Culture     Status: Abnormal   Collection Time: 11/01/2021 10:16 AM   Specimen: In/Out Cath Urine  Result Value Ref Range Status   Specimen Description   Final    IN/OUT CATH URINE Performed at [facility] [address], [place], [place] [address]    Special Requests   Final    NONE Performed at [facility], [place] [address] [place], [place] [address]    Culture (A)  Final    >=100,000 COLONIES/mL KLEBSIELLA OXYTOCA >=100,000 COLONIES/mL ENTEROCOCCUS FAECALIS    Report Status 11/12/2021 FINAL  Final   Organism ID, Bacteria KLEBSIELLA OXYTOCA (A)  Final   Organism ID, Bacteria ENTEROCOCCUS FAECALIS (A)  Final      Susceptibility   Enterococcus faecalis - MIC*    AMPICILLIN <=2 SENSITIVE Sensitive      NITROFURANTOIN <=16 SENSITIVE Sensitive     VANCOMYCIN 1 SENSITIVE Sensitive     * >=100,000 COLONIES/mL ENTEROCOCCUS FAECALIS   Klebsiella oxytoca - MIC*    AMPICILLIN >=32 RESISTANT Resistant     CEFAZOLIN 8 SENSITIVE Sensitive  CEFEPIME <=0.12 SENSITIVE Sensitive     CEFTRIAXONE <=0.25 SENSITIVE Sensitive     CIPROFLOXACIN <=0.25 SENSITIVE Sensitive     GENTAMICIN <=1 SENSITIVE Sensitive     IMIPENEM <=0.25 SENSITIVE Sensitive     NITROFURANTOIN 32 SENSITIVE Sensitive     TRIMETH/SULFA <=20 SENSITIVE Sensitive     AMPICILLIN/SULBACTAM 8 SENSITIVE Sensitive     PIP/TAZO <=4 SENSITIVE Sensitive     * >=100,000 COLONIES/mL KLEBSIELLA OXYTOCA  Blood Culture ID Panel (Reflexed)     Status: Abnormal   Collection Time: 11/11/2021 10:16 AM  Result Value Ref Range Status   Enterococcus faecalis NOT DETECTED NOT DETECTED Final   Enterococcus Faecium NOT DETECTED NOT DETECTED Final   Listeria monocytogenes NOT DETECTED NOT DETECTED Final   Staphylococcus species NOT DETECTED NOT DETECTED Final   Staphylococcus aureus (BCID) NOT DETECTED NOT DETECTED Final   Staphylococcus epidermidis NOT DETECTED NOT DETECTED Final   Staphylococcus lugdunensis NOT DETECTED NOT DETECTED Final   Streptococcus species NOT DETECTED NOT DETECTED Final   Streptococcus agalactiae NOT DETECTED NOT DETECTED Final   Streptococcus pneumoniae NOT DETECTED NOT DETECTED Final   Streptococcus pyogenes NOT DETECTED NOT DETECTED Final   A.calcoaceticus-baumannii NOT DETECTED NOT DETECTED Final   Bacteroides fragilis NOT DETECTED NOT DETECTED Final   Enterobacterales DETECTED (A) NOT DETECTED Final    Comment: Enterobacterales represent a large order of gram negative bacteria, not a single organism. CRITICAL RESULT CALLED TO, READ BACK BY AND VERIFIED WITH: M LILLISTON,PHARMD@0544  11/10/21 [place]    Enterobacter cloacae complex NOT DETECTED NOT DETECTED Final   Escherichia coli NOT DETECTED NOT DETECTED Final   Klebsiella  aerogenes NOT DETECTED NOT DETECTED Final   Klebsiella oxytoca DETECTED (A) NOT DETECTED Final    Comment: CRITICAL RESULT CALLED TO, READ BACK BY AND VERIFIED WITH: M LILLISTON,PHARMD@0544  11/10/21 [place]    Klebsiella pneumoniae NOT DETECTED NOT DETECTED Final   Proteus species NOT DETECTED NOT DETECTED Final   Salmonella species NOT DETECTED NOT DETECTED Final   Serratia marcescens NOT DETECTED NOT DETECTED Final   Haemophilus influenzae NOT DETECTED NOT DETECTED Final   Neisseria meningitidis NOT DETECTED NOT DETECTED Final   Pseudomonas aeruginosa NOT DETECTED NOT DETECTED Final   Stenotrophomonas maltophilia NOT DETECTED NOT DETECTED Final   Candida albicans NOT DETECTED NOT DETECTED Final   Candida auris NOT DETECTED NOT DETECTED Final   Candida glabrata NOT DETECTED NOT DETECTED Final   Candida krusei NOT DETECTED NOT DETECTED Final   Candida parapsilosis NOT DETECTED NOT DETECTED Final   Candida tropicalis NOT DETECTED NOT DETECTED Final   Cryptococcus neoformans/gattii NOT DETECTED NOT DETECTED Final   CTX-M ESBL NOT DETECTED NOT DETECTED Final   Carbapenem resistance IMP NOT DETECTED NOT DETECTED Final   Carbapenem resistance KPC NOT DETECTED NOT DETECTED Final   Carbapenem resistance NDM NOT DETECTED NOT DETECTED Final   Carbapenem resist OXA 48 LIKE NOT DETECTED NOT DETECTED Final   Carbapenem resistance VIM NOT DETECTED NOT DETECTED Final    Comment: Performed at [facility], [address] [place], [place] [address]  Blood Culture (routine x 2)     Status: Abnormal   Collection Time: 11/17/2021 10:21 AM   Specimen: BLOOD RIGHT WRIST  Result Value Ref Range Status   Specimen Description   Final    BLOOD RIGHT WRIST Performed at [facility], [address] [place], [place] [address]    Special Requests   Final    BOTTLES DRAWN AEROBIC  ONLY Blood Culture adequate volume Performed at [facility] [address] [place], [place]  [address]    Culture  Setup Time   Final    GRAM NEGATIVE RODS AEROBIC BOTTLE ONLY CRITICAL VALUE NOTED.  VALUE IS CONSISTENT WITH PREVIOUSLY REPORTED AND CALLED VALUE.    Culture (A)  Final    KLEBSIELLA OXYTOCA SUSCEPTIBILITIES PERFORMED ON PREVIOUS CULTURE WITHIN THE LAST 5 DAYS. Performed at [facility], [place] [address] [place], [place] [address]    Report Status 11/12/2021 FINAL  Final  Resp Panel by RT-PCR (Flu A&B, Covid) Nasopharyngeal Swab     Status: None   Collection Time: 11/08/2021 11:18 AM   Specimen: Nasopharyngeal Swab; Nasopharyngeal(NP) swabs in vial transport medium  Result Value Ref Range Status   SARS Coronavirus 2 by RT PCR NEGATIVE NEGATIVE Final    Comment: (NOTE) SARS-CoV-2 target nucleic acids are NOT DETECTED.  The SARS-CoV-2 RNA is generally detectable in upper respiratory specimens during the acute phase of infection. The lowest concentration of SARS-CoV-2 viral copies this assay can detect is 138 copies/mL. A negative result does not preclude SARS-Cov-2 infection and should not be used as the sole basis for treatment or other patient management decisions. A negative result may occur with  improper specimen collection/handling, submission of specimen other than nasopharyngeal swab, presence of viral mutation(s) within the areas targeted by this assay, and inadequate number of viral copies(<138 copies/mL). A negative result must be combined with clinical observations, patient history, and epidemiological information. The expected result is Negative.  Fact Sheet for Patients:  EntrepreneurPulse.com.au  Fact Sheet for Healthcare Providers:  IncredibleEmployment.be  This test is no t yet approved or cleared by the [place] FDA and  has been authorized for detection and/or diagnosis of SARS-CoV-2 by FDA under an Emergency Use Authorization (EUA). This EUA will remain  in effect (meaning this test can be used)  for the duration of the COVID-19 declaration under Section 564(b)(1) of the Act, 21 U.S.C.section 360bbb-3(b)(1), unless the authorization is terminated  or revoked sooner.       Influenza A by PCR NEGATIVE NEGATIVE Final   Influenza B by PCR NEGATIVE NEGATIVE Final    Comment: (NOTE) The Xpert Xpress SARS-CoV-2/FLU/RSV plus assay is intended as an aid in the diagnosis of influenza from Nasopharyngeal swab specimens and should not be used as a sole basis for treatment. Nasal washings and aspirates are unacceptable for Xpert Xpress SARS-CoV-2/FLU/RSV testing.  Fact Sheet for Patients: EntrepreneurPulse.com.au  Fact Sheet for Healthcare Providers: IncredibleEmployment.be  This test is not yet approved or cleared by the [place] FDA and has been authorized for detection and/or diagnosis of SARS-CoV-2 by FDA under an Emergency Use Authorization (EUA). This EUA will remain in effect (meaning this test can be used) for the duration of the COVID-19 declaration under Section 564(b)(1) of the Act, 21 U.S.C. section 360bbb-3(b)(1), unless the authorization is terminated or revoked.  Performed at [facility], [place] [address] [place], [place] [address]   Culture, blood (routine x 2)     Status: None (Preliminary result)   Collection Time: 11/17/21  5:10 AM   Specimen: BLOOD  Result Value Ref Range Status   Specimen Description   Final    BLOOD BLOOD LEFT HAND Performed at [facility] [address], [place], [place] [address]    Special Requests   Final    BOTTLES DRAWN AEROBIC ONLY Blood Culture adequate volume Performed at  [facility], [place] [address] [place], [place] [address]    Culture  Setup Time   Final    YEAST AEROBIC BOTTLE ONLY CRITICAL VALUE NOTED.  VALUE IS CONSISTENT WITH PREVIOUSLY REPORTED AND CALLED VALUE. Performed at [facility], [place] [address],  [place], [place] [address]    Culture YEAST  Final   Report Status PENDING  Incomplete  Culture, blood (routine x 2)     Status: None (Preliminary result)   Collection Time: 11/17/21  5:20 AM   Specimen: BLOOD  Result Value Ref Range Status   Specimen Description   Final    BLOOD BLOOD LEFT WRIST Performed at [facility] [address], [place], [place] [address]    Special Requests   Final    BOTTLES DRAWN AEROBIC ONLY Blood Culture adequate volume Performed at [facility] [address], [place], [place] [address]    Culture  Setup Time   Final    YEAST AEROBIC BOTTLE ONLY CRITICAL RESULT CALLED TO, READ BACK BY AND VERIFIED WITH: A PHAM PHARMD @0821  11/18/21 [place] Performed at [facility], [place] [address] [place], [place] [address]    Culture YEAST  Final   Report Status PENDING  Incomplete  Blood Culture ID Panel (Reflexed)     Status: Abnormal   Collection Time: 11/17/21  5:20 AM  Result Value Ref Range Status   Enterococcus faecalis NOT DETECTED NOT DETECTED Final   Enterococcus Faecium NOT DETECTED NOT DETECTED Final   Listeria monocytogenes NOT DETECTED NOT DETECTED Final   Staphylococcus species NOT DETECTED NOT DETECTED Final   Staphylococcus aureus (BCID) NOT DETECTED NOT DETECTED Final   Staphylococcus epidermidis NOT DETECTED NOT DETECTED Final   Staphylococcus lugdunensis NOT DETECTED NOT DETECTED Final   Streptococcus species NOT DETECTED NOT DETECTED Final   Streptococcus agalactiae NOT DETECTED NOT DETECTED Final   Streptococcus pneumoniae NOT DETECTED NOT DETECTED Final   Streptococcus pyogenes NOT DETECTED NOT DETECTED Final   A.calcoaceticus-baumannii NOT DETECTED NOT DETECTED Final   Bacteroides fragilis NOT DETECTED NOT DETECTED Final   Enterobacterales NOT DETECTED NOT DETECTED Final   Enterobacter cloacae complex NOT DETECTED NOT DETECTED Final   Escherichia coli NOT DETECTED NOT DETECTED Final   Klebsiella  aerogenes NOT DETECTED NOT DETECTED Final   Klebsiella oxytoca NOT DETECTED NOT DETECTED Final   Klebsiella pneumoniae NOT DETECTED NOT DETECTED Final   Proteus species NOT DETECTED NOT DETECTED Final   Salmonella species NOT DETECTED NOT DETECTED Final   Serratia marcescens NOT DETECTED NOT DETECTED Final   Haemophilus influenzae NOT DETECTED NOT DETECTED Final   Neisseria meningitidis NOT DETECTED NOT DETECTED Final   Pseudomonas aeruginosa NOT DETECTED NOT DETECTED Final   Stenotrophomonas maltophilia NOT DETECTED NOT DETECTED Final   Candida albicans DETECTED (A) NOT DETECTED Final    Comment: CRITICAL RESULT CALLED TO, READ BACK BY AND VERIFIED WITH: A PHAM PHARMD @0821  11/18/21 [place]    Candida auris NOT DETECTED NOT DETECTED Final   Candida glabrata NOT DETECTED NOT DETECTED Final   Candida krusei NOT DETECTED NOT DETECTED Final   Candida parapsilosis NOT DETECTED NOT DETECTED Final   Candida tropicalis NOT DETECTED NOT DETECTED Final   Cryptococcus neoformans/gattii NOT DETECTED NOT DETECTED Final    Comment: Performed at [facility], [address], [place], [place] [address]  MRSA Next Gen by PCR, Nasal     Status: None   Collection Time: 11/17/21  6:24 PM   Specimen: Nasal Mucosa; Nasal Swab  Result Value Ref Range Status   MRSA by PCR Next Gen NOT DETECTED NOT DETECTED Final    Comment: (NOTE) The GeneXpert MRSA Assay (FDA approved for NASAL specimens only), is one component of a comprehensive MRSA colonization surveillance program. It is not intended to diagnose MRSA infection nor to guide or monitor treatment for MRSA infections. Test performance is not FDA approved in patients less than 15 years old. Performed at [facility], [place] [address], [place], [place] [address]   C Difficile Quick Screen (NO PCR Reflex)     Status: None   Collection Time: 11/17/21  8:40 PM   Specimen: STOOL  Result Value Ref Range Status   C Diff antigen NEGATIVE  NEGATIVE Final   C Diff toxin NEGATIVE NEGATIVE Final   C Diff interpretation NEGATIVE  Final    Comment: Performed at [facility], [place] [address] [place], [place] [address]         Radiology Studies: CT ABDOMEN PELVIS WO CONTRAST  Result Date: 11/17/2021 CLINICAL DATA:  Partial small bowel obstruction versus ileus, status post laparoscopic cystoprostatectomy and ileal conduit urinary diversion, adhesiolysis, small-bowel resection EXAM: CT CHEST, ABDOMEN AND PELVIS WITHOUT CONTRAST CT LUMBAR SPINE WITHOUT CONTRAST TECHNIQUE: Multidetector CT imaging of the chest, abdomen and pelvis was performed following the standard protocol without IV contrast. Oral enteric contrast was administered Multidetector CT imaging of the lumbar spine was performed following the standard protocol without IV contrast. COMPARISON:  10/31/2021 FINDINGS: CT CHEST FINDINGS Cardiovascular: Right upper extremity PICC. Normal heart size. Three-vessel coronary artery calcifications. No pericardial effusion. Mediastinum/Nodes: No enlarged mediastinal, hilar, or axillary lymph nodes. Thyroid gland, trachea, and esophagus demonstrate no significant findings. Lungs/Pleura: Mild centrilobular emphysema. Probable mild, underlying pulmonary fibrosis in a bibasilar predominance pattern featuring irregular peripheral interstitial opacity, evaluation limited by the presence of pleural effusions and breath motion artifact. Small bilateral pleural effusions. Musculoskeletal: No chest wall mass or suspicious bone lesions identified. CT ABDOMEN PELVIS FINDINGS Hepatobiliary: No solid liver abnormality is seen. No gallstones, gallbladder wall thickening, or biliary dilatation. Pancreas: Unremarkable. No pancreatic ductal dilatation or surrounding inflammatory changes. Spleen: Normal in size without significant abnormality. Adrenals/Urinary Tract: Adrenal glands are unremarkable. Status post cystoprostatectomy and right lower  quadrant ileal conduit urinary diversion. Mild bilateral hydronephrosis. Bilateral double-J ureteral stents in position. The left renal pelvic pigtail is incompletely formed (series 2, image 76). Stomach/Bowel: Stomach is within normal limits. Appendix appears normal. The colon and rectum is diffusely distended and air and fluid-filled, measuring up to 8.5 cm in caliber. Vascular/Lymphatic: No significant vascular findings are present. No enlarged abdominal or pelvic lymph nodes. Reproductive: No mass or other abnormality. Other: Severe anasarca about the lower abdomen and pelvis (series 2, image 117). Subcutaneous emphysema seen on prior examination is almost entirely resolved (series 2, image 114). There is a large, elongated air and fluid collection in the left hemiabdomen about the left paracolic gutter, low midline abdomen, and pelvis, although difficult to exactly measure, at least 25 point by 8.0 x 6.2 cm (series 2, image 103, series 5, image 55, series 2, image 115). Musculoskeletal: No acute or significant osseous findings. LUMBAR SPINE: Alignment: Normal. Disc spaces: Status post posterior discectomy and fusion of L2 through S1 with partially incorporated disc spaces. No evidence of perihardware fracture or loosening. Vertebral bodies: Intact. IMPRESSION: 1. No evidence of bowel obstruction. The colon rectum are diffusely distended and air and  fluid-filled, consistent with ileus. 2. There is a large, elongated air and fluid collection in the left hemiabdomen about the left paracolic gutter, low midline abdomen, and pelvis, although difficult to exactly measure, at least 25.5 cm in greatest dimension. Findings are consistent with abscess. 3. Status post cystoprostatectomy and right lower quadrant ileal conduit urinary diversion. 4. Mild bilateral hydronephrosis. Bilateral double-J ureteral stents in position. The left renal pelvic pigtail is incompletely formed. 5. Small bilateral pleural effusions. 6.  Probable mild, underlying pulmonary fibrosis in a bibasilar predominance pattern featuring irregular peripheral interstitial opacity, evaluation limited by the presence of pleural effusions and breath motion artifact. This could be further evaluated by ILD protocol CT on an outpatient basis when clinically appropriate. 7. Coronary artery disease. 8. Status post posterior discectomy and fusion of L2 through S1 with partially incorporated disc spaces. No evidence of perihardware fracture or loosening. No acute findings of the lumbar spine. These results will be called to the ordering clinician or representative by the Radiologist Assistant, and communication documented in the PACS or [employer]. Electronically Signed   By: Delanna Ahmadi M.D.   On: 11/17/2021 12:00   CT CHEST WO CONTRAST  Result Date: 11/17/2021 CLINICAL DATA:  Partial small bowel obstruction versus ileus, status post laparoscopic cystoprostatectomy and ileal conduit urinary diversion, adhesiolysis, small-bowel resection EXAM: CT CHEST, ABDOMEN AND PELVIS WITHOUT CONTRAST CT LUMBAR SPINE WITHOUT CONTRAST TECHNIQUE: Multidetector CT imaging of the chest, abdomen and pelvis was performed following the standard protocol without IV contrast. Oral enteric contrast was administered Multidetector CT imaging of the lumbar spine was performed following the standard protocol without IV contrast. COMPARISON:  10/31/2021 FINDINGS: CT CHEST FINDINGS Cardiovascular: Right upper extremity PICC. Normal heart size. Three-vessel coronary artery calcifications. No pericardial effusion. Mediastinum/Nodes: No enlarged mediastinal, hilar, or axillary lymph nodes. Thyroid gland, trachea, and esophagus demonstrate no significant findings. Lungs/Pleura: Mild centrilobular emphysema. Probable mild, underlying pulmonary fibrosis in a bibasilar predominance pattern featuring irregular peripheral interstitial opacity, evaluation limited by the presence of pleural  effusions and breath motion artifact. Small bilateral pleural effusions. Musculoskeletal: No chest wall mass or suspicious bone lesions identified. CT ABDOMEN PELVIS FINDINGS Hepatobiliary: No solid liver abnormality is seen. No gallstones, gallbladder wall thickening, or biliary dilatation. Pancreas: Unremarkable. No pancreatic ductal dilatation or surrounding inflammatory changes. Spleen: Normal in size without significant abnormality. Adrenals/Urinary Tract: Adrenal glands are unremarkable. Status post cystoprostatectomy and right lower quadrant ileal conduit urinary diversion. Mild bilateral hydronephrosis. Bilateral double-J ureteral stents in position. The left renal pelvic pigtail is incompletely formed (series 2, image 76). Stomach/Bowel: Stomach is within normal limits. Appendix appears normal. The colon and rectum is diffusely distended and air and fluid-filled, measuring up to 8.5 cm in caliber. Vascular/Lymphatic: No significant vascular findings are present. No enlarged abdominal or pelvic lymph nodes. Reproductive: No mass or other abnormality. Other: Severe anasarca about the lower abdomen and pelvis (series 2, image 117). Subcutaneous emphysema seen on prior examination is almost entirely resolved (series 2, image 114). There is a large, elongated air and fluid collection in the left hemiabdomen about the left paracolic gutter, low midline abdomen, and pelvis, although difficult to exactly measure, at least 25 point by 8.0 x 6.2 cm (series 2, image 103, series 5, image 55, series 2, image 115). Musculoskeletal: No acute or significant osseous findings. LUMBAR SPINE: Alignment: Normal. Disc spaces: Status post posterior discectomy and fusion of L2 through S1 with partially incorporated disc spaces. No evidence of perihardware fracture or loosening. Vertebral  bodies: Intact. IMPRESSION: 1. No evidence of bowel obstruction. The colon rectum are diffusely distended and air and fluid-filled, consistent  with ileus. 2. There is a large, elongated air and fluid collection in the left hemiabdomen about the left paracolic gutter, low midline abdomen, and pelvis, although difficult to exactly measure, at least 25.5 cm in greatest dimension. Findings are consistent with abscess. 3. Status post cystoprostatectomy and right lower quadrant ileal conduit urinary diversion. 4. Mild bilateral hydronephrosis. Bilateral double-J ureteral stents in position. The left renal pelvic pigtail is incompletely formed. 5. Small bilateral pleural effusions. 6. Probable mild, underlying pulmonary fibrosis in a bibasilar predominance pattern featuring irregular peripheral interstitial opacity, evaluation limited by the presence of pleural effusions and breath motion artifact. This could be further evaluated by ILD protocol CT on an outpatient basis when clinically appropriate. 7. Coronary artery disease. 8. Status post posterior discectomy and fusion of L2 through S1 with partially incorporated disc spaces. No evidence of perihardware fracture or loosening. No acute findings of the lumbar spine. These results will be called to the ordering clinician or representative by the Radiologist Assistant, and communication documented in the PACS or [employer]. Electronically Signed   By: Delanna Ahmadi M.D.   On: 11/17/2021 12:00   CT LUMBAR SPINE WO CONTRAST  Result Date: 11/17/2021 CLINICAL DATA:  Partial small bowel obstruction versus ileus, status post laparoscopic cystoprostatectomy and ileal conduit urinary diversion, adhesiolysis, small-bowel resection EXAM: CT CHEST, ABDOMEN AND PELVIS WITHOUT CONTRAST CT LUMBAR SPINE WITHOUT CONTRAST TECHNIQUE: Multidetector CT imaging of the chest, abdomen and pelvis was performed following the standard protocol without IV contrast. Oral enteric contrast was administered Multidetector CT imaging of the lumbar spine was performed following the standard protocol without IV contrast. COMPARISON:   11/27/2021 FINDINGS: CT CHEST FINDINGS Cardiovascular: Right upper extremity PICC. Normal heart size. Three-vessel coronary artery calcifications. No pericardial effusion. Mediastinum/Nodes: No enlarged mediastinal, hilar, or axillary lymph nodes. Thyroid gland, trachea, and esophagus demonstrate no significant findings. Lungs/Pleura: Mild centrilobular emphysema. Probable mild, underlying pulmonary fibrosis in a bibasilar predominance pattern featuring irregular peripheral interstitial opacity, evaluation limited by the presence of pleural effusions and breath motion artifact. Small bilateral pleural effusions. Musculoskeletal: No chest wall mass or suspicious bone lesions identified. CT ABDOMEN PELVIS FINDINGS Hepatobiliary: No solid liver abnormality is seen. No gallstones, gallbladder wall thickening, or biliary dilatation. Pancreas: Unremarkable. No pancreatic ductal dilatation or surrounding inflammatory changes. Spleen: Normal in size without significant abnormality. Adrenals/Urinary Tract: Adrenal glands are unremarkable. Status post cystoprostatectomy and right lower quadrant ileal conduit urinary diversion. Mild bilateral hydronephrosis. Bilateral double-J ureteral stents in position. The left renal pelvic pigtail is incompletely formed (series 2, image 76). Stomach/Bowel: Stomach is within normal limits. Appendix appears normal. The colon and rectum is diffusely distended and air and fluid-filled, measuring up to 8.5 cm in caliber. Vascular/Lymphatic: No significant vascular findings are present. No enlarged abdominal or pelvic lymph nodes. Reproductive: No mass or other abnormality. Other: Severe anasarca about the lower abdomen and pelvis (series 2, image 117). Subcutaneous emphysema seen on prior examination is almost entirely resolved (series 2, image 114). There is a large, elongated air and fluid collection in the left hemiabdomen about the left paracolic gutter, low midline abdomen, and pelvis,  although difficult to exactly measure, at least 25 point by 8.0 x 6.2 cm (series 2, image 103, series 5, image 55, series 2, image 115). Musculoskeletal: No acute or significant osseous findings. LUMBAR SPINE: Alignment: Normal. Disc spaces: Status post posterior  discectomy and fusion of L2 through S1 with partially incorporated disc spaces. No evidence of perihardware fracture or loosening. Vertebral bodies: Intact. IMPRESSION: 1. No evidence of bowel obstruction. The colon rectum are diffusely distended and air and fluid-filled, consistent with ileus. 2. There is a large, elongated air and fluid collection in the left hemiabdomen about the left paracolic gutter, low midline abdomen, and pelvis, although difficult to exactly measure, at least 25.5 cm in greatest dimension. Findings are consistent with abscess. 3. Status post cystoprostatectomy and right lower quadrant ileal conduit urinary diversion. 4. Mild bilateral hydronephrosis. Bilateral double-J ureteral stents in position. The left renal pelvic pigtail is incompletely formed. 5. Small bilateral pleural effusions. 6. Probable mild, underlying pulmonary fibrosis in a bibasilar predominance pattern featuring irregular peripheral interstitial opacity, evaluation limited by the presence of pleural effusions and breath motion artifact. This could be further evaluated by ILD protocol CT on an outpatient basis when clinically appropriate. 7. Coronary artery disease. 8. Status post posterior discectomy and fusion of L2 through S1 with partially incorporated disc spaces. No evidence of perihardware fracture or loosening. No acute findings of the lumbar spine. These results will be called to the ordering clinician or representative by the Radiologist Assistant, and communication documented in the PACS or [employer]. Electronically Signed   By: Delanna Ahmadi M.D.   On: 11/17/2021 12:00   DG CHEST PORT 1 VIEW  Result Date: 11/17/2021 CLINICAL DATA:  Back  pain, weakness, short of breath EXAM: PORTABLE CHEST 1 VIEW COMPARISON:  11/10/2021 FINDINGS: Central line removed.  NG removed. Mild right lower lobe airspace disease unchanged Improvement in left lower lobe airspace disease and small left effusion. Negative for pulmonary edema. IMPRESSION: Mild improvement in left lower lobe airspace disease and left effusion. No change in right lower lobe airspace disease. Electronically Signed   By: Franchot Gallo M.D.   On: 11/17/2021 10:50   DG Abd Portable 1V-Small Bowel Obstruction Protocol-initial, 8 hr delay  Result Date: 11/16/2021 CLINICAL DATA:  Small bowel protocol, 8 hour postcontrast film. EXAM: PORTABLE ABDOMEN - 1 VIEW COMPARISON:  Abdominal x-ray 11/16/2021, 9:36 a.m. FINDINGS: No significant oral contrast identified. Diffusely air-filled dilated large and small bowel appears similar to the prior study. Ureteral stents are unchanged in position. Catheters overlie the right abdomen. Lumbar fusion hardware is unchanged. There are no suspicious calcifications. No acute fractures are identified. IMPRESSION: 1. No oral contrast visualized. Stable diffuse small and large bowel dilatation. Electronically Signed   By: Ronney Asters M.D.   On: 11/16/2021 23:05   CT IMAGE GUIDED DRAINAGE BY PERCUTANEOUS CATHETER  Result Date: 11/18/2021 INDICATION: 80 year old male with fluid collection lower abdomen referred for drainage EXAM: CT GUIDED DRAINAGE OF ABDOMINAL ABSCESS MEDICATIONS: The patient is currently admitted to the hospital and receiving intravenous antibiotics. The antibiotics were administered within an appropriate time frame prior to the initiation of the procedure. ANESTHESIA/SEDATION: 0 mg IV Versed 25 mcg IV Fentanyl Moderate Sedation Time:  0 moderate sedation not used. The patient was continuously monitored during the procedure by the interventional radiology nurse under my direct supervision. COMPLICATIONS: None TECHNIQUE: Informed written consent  was obtained from the patient after a thorough discussion of the procedural risks, benefits and alternatives. All questions were addressed. Maximal Sterile Barrier Technique was utilized including caps, mask, sterile gowns, sterile gloves, sterile drape, hand hygiene and skin antiseptic. A timeout was performed prior to the initiation of the procedure. PROCEDURE: The left lower quadrant was prepped with chlorhexidine in  a sterile fashion, and a sterile drape was applied covering the operative field. A sterile gown and sterile gloves were used for the procedure. Local anesthesia was provided with 1% Lidocaine. After scout CT was acquired, the patient is prepped and draped in the usual sterile fashion. 1% lidocaine was used for local anesthesia. Using CT guidance, modified Seldinger technique was used to place a 12 [place] drain into the fluid of the left lower quadrant. Sample sent for culture. Catheter was sutured in position and attached to gravity bag. Final CT was acquired. Proximally 350 cc of fluid removed. Patient tolerated the procedure well and remained hemodynamically stable throughout. No complications were encountered and no significant blood loss. FINDINGS: Twelve French drain into left lower quadrant fluid collection with near complete decompression. Approximally 3 in 50 cc of thin turbid yellow fluid IMPRESSION: Status post CT-guided drainage of left lower quadrant fluid collection. Signed, Dulcy Fanny. Dellia Nims, RPVI [facility] Electronically Signed   By: Corrie Mckusick D.O.   On: 11/18/2021 12:20        Scheduled Meds:  chlorhexidine  15 mL Mouth Rinse BID   Chlorhexidine Gluconate Cloth  6 each Topical Daily   feeding supplement  237 mL Oral TID BM   fluticasone  2 spray Each Nare Daily   heparin  5,000 Units Subcutaneous Q8H   insulin aspart  0-9 Units Subcutaneous Q6H   lip balm  1 application Topical BID   magic mouthwash  w/lidocaine  10 mL Oral QID   mouth rinse  15 mL Mouth Rinse q12n4p   nystatin  5 mL Oral QID   nystatin   Topical BID   simethicone  80 mg Oral QID   sodium chloride flush  10-40 mL Intracatheter Q12H   sodium chloride flush  10-40 mL Intracatheter Q12H   sodium chloride flush  5 mL Intracatheter Q8H   umeclidinium bromide  1 puff Inhalation Daily   Continuous Infusions:  sodium chloride 75 mL/hr at 11/14/21 2328   chlorproMAZINE (THORAZINE) IV Stopped (11/18/21 0030)   [START ON 11/19/2021] fluconazole (DIFLUCAN) IV     methocarbamol (ROBAXIN) IV Stopped (11/18/21 0720)   ondansetron (ZOFRAN) IV     piperacillin-tazobactam (ZOSYN)  IV Stopped (11/18/21 1245)   sodium chloride     TPN ADULT (ION) 40 mL/hr at 11/17/21 1848     LOS: 9 days       Kayleen Memos, DO [facility] Available via Epic secure chat 7am-7pm After these hours, please refer to coverage provider listed on [email or web address] 11/18/2021, 1:34 PM

## 2021-11-18 NOTE — Progress Notes (Signed)
PHARMACY - TOTAL PARENTERAL NUTRITION CONSULT NOTE   Indication: Prolonged ileus  Patient Measurements: Height: 5\' 7"  (170.2 cm) Weight: 89.5 kg (197 lb 5 oz) IBW/kg (Calculated) : 66.1 TPN AdjBW (KG): 72.1 Body mass index is 30.9 kg/m.   Assessment: 65 y/oM with PMH of bladder cancer who underwent cystectomy and ileal conduit on 10/25/21 re-admitted on 11/01/2021 with septic shock secondary to urinary source. CT concerning for SBO. NG removed 11/13/21 PM and started on CLD, but then developed emesis 11/14/21. He remains distended, having hiccups, and not taking in much PO. Also continues to have flatus and liquid stools. Consistent with pSBO/ileus picture per surgery. Pharmacy consulted for TPN dosing.   Glucose / Insulin: No hx of DM. CBgs controlled on SSI. Only 1 unit in last 48 hrs. Electrolytes: wnl exc K borderline low at 3.6. CoCa 8.9 Renal: AKI. SCr and BUN trending up Hepatic: LFTs WNL. Albumin 1.7. Prealbumin < 5 (11/11) Triglycerides:  98 (11/18) Intake / Output; MIVF: Restrict fluids as much as possible due to volume overload. UOP charted 1500 mL. Drain placement planned for today per Radiology S/p albumin x 1 GI Imaging: AXR: Abnormal dilation of small-bowel loops with interval worsening suggesting ileus or partial obstruction. GI Surgeries / Procedures: Robotic-assisted laparoscopic radical cystectomy, radical prostatectomy, bilateral pelvic lymph node dissection, extensive laparoscopic adhesiolysis, small bowel resection, ileal conduit urinary diversion on 10/25/21   Central access: PICC to be placed today, 11/16/21 TPN start date: 11/16/21  Nutritional Goals: Goal TPN rate is 110 mL/hr (provides 111 g of protein and 2155 kcals per day)  RD Assessment: Estimated Needs Total Energy Estimated Needs: 2030-2275 kcal Total Protein Estimated Needs: 100-115 grams Total Fluid Estimated Needs: >/= 2.2 L/day  Current Nutrition:  NPO Start TPN  Plan:  KCl runs ordered  per provider  Will hold TPN for now due to new candidemia. Fluconazole started. Primary and ID aware. F/U if line holiday is needed and ability to restart nutrition when appropriate  Elenor Quinones, PharmD, BCPS, BCIDP Clinical Pharmacist 11/18/2021 9:04 AM

## 2021-11-18 NOTE — Progress Notes (Signed)
PHARMACY - PHYSICIAN COMMUNICATION CRITICAL VALUE ALERT - BLOOD CULTURE IDENTIFICATION (BCID)  Assessment: Septic shock due to Klebsiella bacteremia. Repeat blood cx now showing candida albicans.  Name of physician (or Provider) Contacted: Irene Pap, DO  Current antibiotics: Zosyn  Changes to prescribed antibiotics recommended:  Adding fluconzole 800mg  IV x 1 for load, then 200mg  IV daily due to AKI Continue Zosyn 3.375 gm IV q8h (4 hour infusion) Monitor renal function F/U ID consult   Results for orders placed or performed during the hospital encounter of 11/25/2021  Blood Culture ID Panel (Reflexed) (Collected: 11/17/2021  5:20 AM)  Result Value Ref Range   Enterococcus faecalis NOT DETECTED NOT DETECTED   Enterococcus Faecium NOT DETECTED NOT DETECTED   Listeria monocytogenes NOT DETECTED NOT DETECTED   Staphylococcus species NOT DETECTED NOT DETECTED   Staphylococcus aureus (BCID) NOT DETECTED NOT DETECTED   Staphylococcus epidermidis NOT DETECTED NOT DETECTED   Staphylococcus lugdunensis NOT DETECTED NOT DETECTED   Streptococcus species NOT DETECTED NOT DETECTED   Streptococcus agalactiae NOT DETECTED NOT DETECTED   Streptococcus pneumoniae NOT DETECTED NOT DETECTED   Streptococcus pyogenes NOT DETECTED NOT DETECTED   A.calcoaceticus-baumannii NOT DETECTED NOT DETECTED   Bacteroides fragilis NOT DETECTED NOT DETECTED   Enterobacterales NOT DETECTED NOT DETECTED   Enterobacter cloacae complex NOT DETECTED NOT DETECTED   Escherichia coli NOT DETECTED NOT DETECTED   Klebsiella aerogenes NOT DETECTED NOT DETECTED   Klebsiella oxytoca NOT DETECTED NOT DETECTED   Klebsiella pneumoniae NOT DETECTED NOT DETECTED   Proteus species NOT DETECTED NOT DETECTED   Salmonella species NOT DETECTED NOT DETECTED   Serratia marcescens NOT DETECTED NOT DETECTED   Haemophilus influenzae NOT DETECTED NOT DETECTED   Neisseria meningitidis NOT DETECTED NOT DETECTED   Pseudomonas aeruginosa  NOT DETECTED NOT DETECTED   Stenotrophomonas maltophilia NOT DETECTED NOT DETECTED   Candida albicans DETECTED (A) NOT DETECTED   Candida auris NOT DETECTED NOT DETECTED   Candida glabrata NOT DETECTED NOT DETECTED   Candida krusei NOT DETECTED NOT DETECTED   Candida parapsilosis NOT DETECTED NOT DETECTED   Candida tropicalis NOT DETECTED NOT DETECTED   Cryptococcus neoformans/gattii NOT DETECTED NOT DETECTED   Elenor Quinones, PharmD, BCPS, BCIDP Clinical Pharmacist 11/18/2021 9:46 AM

## 2021-11-18 NOTE — Progress Notes (Signed)
Pharmacy Antibiotic Note  William [name] is a 80 y.o. male admitted on 11/23/2021 with sepsis.  Pharmacy was originally consulted for vancomycin and cefepime dosing > Ceftriaxone with Kleb pneumo BCID > Unasyn with addition of E faecalis to urine culture. Abx change from Unasyn to Zosyn.  Latest BCx: shows candida albicans.  Plan: Give fluconazole 800mg  IV x 1, then start 200mg  IV daily Continue Zosyn 3.375gm q8, 4 hr infusion  Height: 5\' 7"  (170.2 cm) Weight: 89.5 kg (197 lb 5 oz) IBW/kg (Calculated) : 66.1  Temp (24hrs), Avg:99 F (37.2 C), Min:97.6 F (36.4 C), Max:102.1 F (38.9 C)  Recent Labs  Lab 11/14/21 0313 11/15/21 0527 11/15/21 1508 11/16/21 0527 11/16/21 1201 11/17/21 0521 11/17/21 1020 11/18/21 0158 11/18/21 0229  WBC 16.2* 16.1*  --  19.7* 20.7* 24.4*  --   --   --   CREATININE 0.84 0.82 0.83 1.07 1.00 1.74*  --  2.56*  --   LATICACIDVEN  --   --   --   --  1.0  --  2.3*  --  1.0     Estimated Creatinine Clearance: 25 mL/min (A) (by C-G formula based on SCr of 2.56 mg/dL (H)).    Allergies  Allergen Reactions   Hydrocodone Other (See Comments)    Causes constipation even with stool softner    Antimicrobials this admission: 11/10 Vanc/Flagyl x1 11/10 Cefepime >> 11/11 11/11 CTX >> 11/12 11/13 Unasyn >> 11/18 11/18 Zosy >> 11/19 Fluconazole  Dose adjustments this admission:  Microbiology results: 11/10 BCx: 3/3 bottles Kleb oxytoca, res to Amp 11/10 UCx: Kleb oxytoca (res to Amp) + E faecalis (pan-sens x3) 11/18 rpt BCx: yeast (candida albicans)  Thank you for allowing pharmacy to be a part of this patient's care.  Elenor Quinones, PharmD, BCPS, BCIDP Clinical Pharmacist 11/18/2021 8:58 AM

## 2021-11-18 NOTE — Progress Notes (Addendum)
[facility] Progress Note  Subjective: seen in room, conversant, "not doing well". UOP yest 1000 cc , got IV lasix 20-73m doses. Creat up 2.5 today. K+ stable. Hb down 6.8.   Vitals:   11/18/21 0600 11/18/21 0700 11/18/21 0720 11/18/21 0829  BP: (!) 93/44 (!) 114/54 (!) 109/55   Pulse: (!) 120 (!) 132 (!) 127   Resp: 12 (!) 27 (!) 24   Temp:    98.8 F (37.1 C)  TempSrc:    Axillary  SpO2: 98% 92% 97%   Weight:      Height:        Exam: Gen alert, no distress, moaning in pain No rash, cyanosis or gangrene Sclera anicteric, throat clear  No jvd or bruits Chest clear bilat to bases, no rales/ wheezing RRR no MRG Abd soft ntnd no mass or ascites +bs GU normal MS no joint effusions or deformity Ext diffuse 2-3+ UE and LE edema, no wounds or ulcers Neuro is alert, Ox 3 , nf              Home meds include - advil, neurontin, percocet prn, zanaflex, spiriva, flonase, prns     Baseline creat from this admission is 0.82- 1.07, eGFR > 60 ml/min.     UA 11/10 - cloudy amber, large H/ LE, 100 prot, many bact, >50 rbc/ wbc   UNa 43, UCr 48     CT kidneys > Adrenals/Urinary Tract: Adrenal glands are unremarkable. Status post cystoprostatectomy and right lower quadrant ileal conduit urinary diversion. Mild bilateral hydronephrosis. Bilateral double-J ureteral stents in position. The left renal pelvic pigtail is incompletely formed (series 2, image 76).    CXR  11/18 - Mild right lower lobe airspace disease unchanged. Improvement in left lower lobe airspace disease and small left effusion. Negative for pulmonary edema.      Assessment/ Plan: AKI - b/l creat 0.82- 1.07, eGFR > 60 ml/min from this admission. Creat up to 1.7 today. In setting of a recent cystectomy/ ileal conduit surgery on 10/27, here now w/ urosepsis d/t Klebs oxytoca. Creat 3 on admission, improved to 0.8 and then increased up to 1.7. UA from 11/10 and 11/18 c/w infection. Urine lytes c/w ATN or  euvolemia. Imaging shows chronic bilat hydro and bilat stents, also showing cystoprostatectomy, ileal conduit. Unclear cause of AKI, sepsis/ abscess +/- vol ^^/ decomp CHF. Gave IV lasix for vol ^^ yest, creat up today. CXR neg for edema 11/18. Will dc lasix. No indication for RRT at this time. Cont rx for  sepsis/ infection. Will follow.  Vol overload - as above SP recent cystectomy w/ ileal conduit - for bladder cancer Nutrition - getting TPN Hypokalemia - primary team replacing w/ pharm assist        Rob Rondey Fallen 11/18/2021, 9:30 AM   Recent Labs  Lab 11/16/21 0527 11/16/21 1201 11/17/21 0521 11/18/21 0158 11/18/21 0850  K 3.2*   < > 3.6 3.6  --   BUN 17   < > 22 31*  --   CREATININE 1.07   < > 1.74* 2.56*  --   CALCIUM 7.4*   < > 7.4* 7.2*  --   PHOS 3.3  --  3.0  --   --   HGB 8.6*   < > 9.6*  --  6.8*   < > = values in this interval not displayed.   Inpatient medications:  bisacodyl  10 mg Rectal Daily   chlorhexidine  15 mL Mouth  Rinse BID   Chlorhexidine Gluconate Cloth  6 each Topical Daily   feeding supplement  237 mL Oral TID BM   fluticasone  2 spray Each Nare Daily   heparin  5,000 Units Subcutaneous Q8H   insulin aspart  0-9 Units Subcutaneous Q6H   lip balm  1 application Topical BID   magic mouthwash w/lidocaine  10 mL Oral QID   mouth rinse  15 mL Mouth Rinse q12n4p   nystatin  5 mL Oral QID   nystatin   Topical BID   simethicone  80 mg Oral QID   sodium chloride flush  10-40 mL Intracatheter Q12H   sodium chloride flush  10-40 mL Intracatheter Q12H   umeclidinium bromide  1 puff Inhalation Daily    sodium chloride 75 mL/hr at 11/14/21 2328   chlorproMAZINE (THORAZINE) IV Stopped (11/18/21 0030)   [START ON 11/19/2021] fluconazole (DIFLUCAN) IV     fluconazole (DIFLUCAN) IV 800 mg (11/18/21 0916)   methocarbamol (ROBAXIN) IV Stopped (11/18/21 0720)   ondansetron (ZOFRAN) IV     piperacillin-tazobactam (ZOSYN)  IV 3.375 g (11/18/21 6759)    potassium chloride 10 mEq (11/18/21 0922)   TPN ADULT (ION) 40 mL/hr at 11/17/21 1848   acetaminophen, chlorproMAZINE (THORAZINE) IV, diphenhydrAMINE, HYDROmorphone (DILAUDID) injection, magic mouthwash, menthol-cetylpyridinium, methocarbamol (ROBAXIN) IV, ondansetron (ZOFRAN) IV **OR** ondansetron (ZOFRAN) IV, phenol, sodium chloride flush, sodium chloride flush

## 2021-11-19 ENCOUNTER — Encounter (HOSPITAL_COMMUNITY): Payer: Self-pay | Admitting: *Deleted

## 2021-11-19 DIAGNOSIS — A419 Sepsis, unspecified organism: Secondary | ICD-10-CM | POA: Diagnosis not present

## 2021-11-19 DIAGNOSIS — R6521 Severe sepsis with septic shock: Secondary | ICD-10-CM | POA: Diagnosis not present

## 2021-11-19 LAB — COMPREHENSIVE METABOLIC PANEL
ALT: 15 U/L (ref 0–44)
AST: 26 U/L (ref 15–41)
Albumin: 1.7 g/dL — ABNORMAL LOW (ref 3.5–5.0)
Alkaline Phosphatase: 74 U/L (ref 38–126)
Anion gap: 8 (ref 5–15)
BUN: 33 mg/dL — ABNORMAL HIGH (ref 8–23)
CO2: 23 mmol/L (ref 22–32)
Calcium: 7 mg/dL — ABNORMAL LOW (ref 8.9–10.3)
Chloride: 105 mmol/L (ref 98–111)
Creatinine, Ser: 2.58 mg/dL — ABNORMAL HIGH (ref 0.61–1.24)
GFR, Estimated: 25 mL/min — ABNORMAL LOW (ref >60)
Glucose, Bld: 73 mg/dL (ref 70–99)
Potassium: 3.5 mmol/L (ref 3.5–5.1)
Sodium: 136 mmol/L (ref 135–145)
Total Bilirubin: 0.7 mg/dL (ref 0.3–1.2)
Total Protein: 4.8 g/dL — ABNORMAL LOW (ref 6.5–8.1)

## 2021-11-19 LAB — GLUCOSE, CAPILLARY
Glucose-Capillary: 107 mg/dL — ABNORMAL HIGH (ref 70–99)
Glucose-Capillary: 132 mg/dL — ABNORMAL HIGH (ref 70–99)
Glucose-Capillary: 137 mg/dL — ABNORMAL HIGH (ref 70–99)
Glucose-Capillary: 138 mg/dL — ABNORMAL HIGH (ref 70–99)
Glucose-Capillary: 55 mg/dL — ABNORMAL LOW (ref 70–99)
Glucose-Capillary: 82 mg/dL (ref 70–99)

## 2021-11-19 LAB — CBC
HCT: 23.7 % — ABNORMAL LOW (ref 39.0–52.0)
Hemoglobin: 7.5 g/dL — ABNORMAL LOW (ref 13.0–17.0)
MCH: 29.6 pg (ref 26.0–34.0)
MCHC: 31.6 g/dL (ref 30.0–36.0)
MCV: 93.7 fL (ref 80.0–100.0)
Platelets: 317 10*3/uL (ref 150–400)
RBC: 2.53 MIL/uL — ABNORMAL LOW (ref 4.22–5.81)
RDW: 15.1 % (ref 11.5–15.5)
WBC: 13.4 10*3/uL — ABNORMAL HIGH (ref 4.0–10.5)
nRBC: 0 % (ref 0.0–0.2)

## 2021-11-19 LAB — PHOSPHORUS: Phosphorus: 4.7 mg/dL — ABNORMAL HIGH (ref 2.5–4.6)

## 2021-11-19 MED ORDER — TROPICAMIDE 1 % OP SOLN
1.0000 [drp] | OPHTHALMIC | Status: AC
  Administered 2021-11-19: 1 [drp] via OPHTHALMIC
  Filled 2021-11-19: qty 3

## 2021-11-19 MED ORDER — PHENYLEPHRINE HCL 2.5 % OP SOLN
1.0000 [drp] | OPHTHALMIC | Status: AC
  Administered 2021-11-19: 1 [drp] via OPHTHALMIC
  Filled 2021-11-19: qty 15

## 2021-11-19 MED ORDER — OXYCODONE HCL 5 MG PO TABS
5.0000 mg | ORAL_TABLET | Freq: Once | ORAL | Status: AC
  Administered 2021-11-20: 5 mg via ORAL
  Filled 2021-11-19: qty 1

## 2021-11-19 MED ORDER — SODIUM CHLORIDE 0.9 % IV SOLN
200.0000 mg | Freq: Once | INTRAVENOUS | Status: AC
  Administered 2021-11-19: 200 mg via INTRAVENOUS
  Filled 2021-11-19: qty 200

## 2021-11-19 MED ORDER — POTASSIUM CHLORIDE 10 MEQ/100ML IV SOLN
10.0000 meq | INTRAVENOUS | Status: AC
  Administered 2021-11-19 (×2): 10 meq via INTRAVENOUS
  Filled 2021-11-19 (×2): qty 100

## 2021-11-19 MED ORDER — DEXTROSE 50 % IV SOLN
50.0000 mL | Freq: Once | INTRAVENOUS | Status: AC
  Administered 2021-11-19: 50 mL via INTRAVENOUS

## 2021-11-19 MED ORDER — CALCIUM GLUCONATE-NACL 1-0.675 GM/50ML-% IV SOLN
1.0000 g | Freq: Once | INTRAVENOUS | Status: AC
  Administered 2021-11-19: 1000 mg via INTRAVENOUS
  Filled 2021-11-19: qty 50

## 2021-11-19 MED ORDER — SODIUM CHLORIDE 0.9 % IV SOLN
100.0000 mg | INTRAVENOUS | Status: DC
  Administered 2021-11-20 – 2021-11-29 (×10): 100 mg via INTRAVENOUS
  Filled 2021-11-19 (×10): qty 100

## 2021-11-19 NOTE — Progress Notes (Signed)
Subjective/Chief Complaint: Having flatus, feels better after drain   Objective: Vital signs in last 24 hours: Temp:  [98 F (36.7 C)-99.2 F (37.3 C)] 98.3 F (36.8 C) (11/20 0800) Pulse Rate:  [104-130] 119 (11/20 0715) Resp:  [12-29] 22 (11/20 0715) BP: (96-151)/(46-84) 110/70 (11/20 0715) SpO2:  [95 %-100 %] 99 % (11/20 0715) Weight:  [86.8 kg] 86.8 kg (11/20 0359) Last BM Date: 11/19/21  Intake/Output from previous day: 11/19 0701 - 11/20 0700 In: 2379.7 [P.O.:720; I.V.:425.1; Blood:289.2; IV Piggyback:895.4] Out: 3009 [Urine:2825; Drains:1000; Stool:950] Intake/Output this shift: No intake/output data recorded.  GI: soft approp tender urostomy functional,  clear yellow fluid in drain  Lab Results:  Recent Labs    11/18/21 1928 11/19/21 0358  WBC 18.3* 13.4*  HGB 8.0* 7.5*  HCT 25.4* 23.7*  PLT 287 317   BMET Recent Labs    11/18/21 0158 11/19/21 0358  NA 135 136  K 3.6 3.5  CL 104 105  CO2 24 23  GLUCOSE 119* 73  BUN 31* 33*  CREATININE 2.56* 2.58*  CALCIUM 7.2* 7.0*   PT/INR Recent Labs    11/18/21 1928  LABPROT 16.2*  INR 1.3*   ABG Recent Labs    11/17/21 0955  PHART 7.435  HCO3 22.4    Studies/Results: CT ABDOMEN PELVIS WO CONTRAST  Result Date: 11/17/2021 CLINICAL DATA:  Partial small bowel obstruction versus ileus, status post laparoscopic cystoprostatectomy and ileal conduit urinary diversion, adhesiolysis, small-bowel resection EXAM: CT CHEST, ABDOMEN AND PELVIS WITHOUT CONTRAST CT LUMBAR SPINE WITHOUT CONTRAST TECHNIQUE: Multidetector CT imaging of the chest, abdomen and pelvis was performed following the standard protocol without IV contrast. Oral enteric contrast was administered Multidetector CT imaging of the lumbar spine was performed following the standard protocol without IV contrast. COMPARISON:  11/09/2021 FINDINGS: CT CHEST FINDINGS Cardiovascular: Right upper extremity PICC. Normal heart size. Three-vessel coronary  artery calcifications. No pericardial effusion. Mediastinum/Nodes: No enlarged mediastinal, hilar, or axillary lymph nodes. Thyroid gland, trachea, and esophagus demonstrate no significant findings. Lungs/Pleura: Mild centrilobular emphysema. Probable mild, underlying pulmonary fibrosis in a bibasilar predominance pattern featuring irregular peripheral interstitial opacity, evaluation limited by the presence of pleural effusions and breath motion artifact. Small bilateral pleural effusions. Musculoskeletal: No chest wall mass or suspicious bone lesions identified. CT ABDOMEN PELVIS FINDINGS Hepatobiliary: No solid liver abnormality is seen. No gallstones, gallbladder wall thickening, or biliary dilatation. Pancreas: Unremarkable. No pancreatic ductal dilatation or surrounding inflammatory changes. Spleen: Normal in size without significant abnormality. Adrenals/Urinary Tract: Adrenal glands are unremarkable. Status post cystoprostatectomy and right lower quadrant ileal conduit urinary diversion. Mild bilateral hydronephrosis. Bilateral double-J ureteral stents in position. The left renal pelvic pigtail is incompletely formed (series 2, image 76). Stomach/Bowel: Stomach is within normal limits. Appendix appears normal. The colon and rectum is diffusely distended and air and fluid-filled, measuring up to 8.5 cm in caliber. Vascular/Lymphatic: No significant vascular findings are present. No enlarged abdominal or pelvic lymph nodes. Reproductive: No mass or other abnormality. Other: Severe anasarca about the lower abdomen and pelvis (series 2, image 117). Subcutaneous emphysema seen on prior examination is almost entirely resolved (series 2, image 114). There is a large, elongated air and fluid collection in the left hemiabdomen about the left paracolic gutter, low midline abdomen, and pelvis, although difficult to exactly measure, at least 25 point by 8.0 x 6.2 cm (series 2, image 103, series 5, image 55, series 2,  image 115). Musculoskeletal: No acute or significant osseous findings. LUMBAR SPINE: Alignment:  Normal. Disc spaces: Status post posterior discectomy and fusion of L2 through S1 with partially incorporated disc spaces. No evidence of perihardware fracture or loosening. Vertebral bodies: Intact. IMPRESSION: 1. No evidence of bowel obstruction. The colon rectum are diffusely distended and air and fluid-filled, consistent with ileus. 2. There is a large, elongated air and fluid collection in the left hemiabdomen about the left paracolic gutter, low midline abdomen, and pelvis, although difficult to exactly measure, at least 25.5 cm in greatest dimension. Findings are consistent with abscess. 3. Status post cystoprostatectomy and right lower quadrant ileal conduit urinary diversion. 4. Mild bilateral hydronephrosis. Bilateral double-J ureteral stents in position. The left renal pelvic pigtail is incompletely formed. 5. Small bilateral pleural effusions. 6. Probable mild, underlying pulmonary fibrosis in a bibasilar predominance pattern featuring irregular peripheral interstitial opacity, evaluation limited by the presence of pleural effusions and breath motion artifact. This could be further evaluated by ILD protocol CT on an outpatient basis when clinically appropriate. 7. Coronary artery disease. 8. Status post posterior discectomy and fusion of L2 through S1 with partially incorporated disc spaces. No evidence of perihardware fracture or loosening. No acute findings of the lumbar spine. These results will be called to the ordering clinician or representative by the Radiologist Assistant, and communication documented in the PACS or [employer]. Electronically Signed   By: Delanna Ahmadi M.D.   On: 11/17/2021 12:00   CT CHEST WO CONTRAST  Result Date: 11/17/2021 CLINICAL DATA:  Partial small bowel obstruction versus ileus, status post laparoscopic cystoprostatectomy and ileal conduit urinary diversion,  adhesiolysis, small-bowel resection EXAM: CT CHEST, ABDOMEN AND PELVIS WITHOUT CONTRAST CT LUMBAR SPINE WITHOUT CONTRAST TECHNIQUE: Multidetector CT imaging of the chest, abdomen and pelvis was performed following the standard protocol without IV contrast. Oral enteric contrast was administered Multidetector CT imaging of the lumbar spine was performed following the standard protocol without IV contrast. COMPARISON:  11/25/2021 FINDINGS: CT CHEST FINDINGS Cardiovascular: Right upper extremity PICC. Normal heart size. Three-vessel coronary artery calcifications. No pericardial effusion. Mediastinum/Nodes: No enlarged mediastinal, hilar, or axillary lymph nodes. Thyroid gland, trachea, and esophagus demonstrate no significant findings. Lungs/Pleura: Mild centrilobular emphysema. Probable mild, underlying pulmonary fibrosis in a bibasilar predominance pattern featuring irregular peripheral interstitial opacity, evaluation limited by the presence of pleural effusions and breath motion artifact. Small bilateral pleural effusions. Musculoskeletal: No chest wall mass or suspicious bone lesions identified. CT ABDOMEN PELVIS FINDINGS Hepatobiliary: No solid liver abnormality is seen. No gallstones, gallbladder wall thickening, or biliary dilatation. Pancreas: Unremarkable. No pancreatic ductal dilatation or surrounding inflammatory changes. Spleen: Normal in size without significant abnormality. Adrenals/Urinary Tract: Adrenal glands are unremarkable. Status post cystoprostatectomy and right lower quadrant ileal conduit urinary diversion. Mild bilateral hydronephrosis. Bilateral double-J ureteral stents in position. The left renal pelvic pigtail is incompletely formed (series 2, image 76). Stomach/Bowel: Stomach is within normal limits. Appendix appears normal. The colon and rectum is diffusely distended and air and fluid-filled, measuring up to 8.5 cm in caliber. Vascular/Lymphatic: No significant vascular findings are  present. No enlarged abdominal or pelvic lymph nodes. Reproductive: No mass or other abnormality. Other: Severe anasarca about the lower abdomen and pelvis (series 2, image 117). Subcutaneous emphysema seen on prior examination is almost entirely resolved (series 2, image 114). There is a large, elongated air and fluid collection in the left hemiabdomen about the left paracolic gutter, low midline abdomen, and pelvis, although difficult to exactly measure, at least 25 point by 8.0 x 6.2 cm (series 2, image 103,  series 5, image 55, series 2, image 115). Musculoskeletal: No acute or significant osseous findings. LUMBAR SPINE: Alignment: Normal. Disc spaces: Status post posterior discectomy and fusion of L2 through S1 with partially incorporated disc spaces. No evidence of perihardware fracture or loosening. Vertebral bodies: Intact. IMPRESSION: 1. No evidence of bowel obstruction. The colon rectum are diffusely distended and air and fluid-filled, consistent with ileus. 2. There is a large, elongated air and fluid collection in the left hemiabdomen about the left paracolic gutter, low midline abdomen, and pelvis, although difficult to exactly measure, at least 25.5 cm in greatest dimension. Findings are consistent with abscess. 3. Status post cystoprostatectomy and right lower quadrant ileal conduit urinary diversion. 4. Mild bilateral hydronephrosis. Bilateral double-J ureteral stents in position. The left renal pelvic pigtail is incompletely formed. 5. Small bilateral pleural effusions. 6. Probable mild, underlying pulmonary fibrosis in a bibasilar predominance pattern featuring irregular peripheral interstitial opacity, evaluation limited by the presence of pleural effusions and breath motion artifact. This could be further evaluated by ILD protocol CT on an outpatient basis when clinically appropriate. 7. Coronary artery disease. 8. Status post posterior discectomy and fusion of L2 through S1 with partially  incorporated disc spaces. No evidence of perihardware fracture or loosening. No acute findings of the lumbar spine. These results will be called to the ordering clinician or representative by the Radiologist Assistant, and communication documented in the PACS or [employer]. Electronically Signed   By: Delanna Ahmadi M.D.   On: 11/17/2021 12:00   CT LUMBAR SPINE WO CONTRAST  Result Date: 11/17/2021 CLINICAL DATA:  Partial small bowel obstruction versus ileus, status post laparoscopic cystoprostatectomy and ileal conduit urinary diversion, adhesiolysis, small-bowel resection EXAM: CT CHEST, ABDOMEN AND PELVIS WITHOUT CONTRAST CT LUMBAR SPINE WITHOUT CONTRAST TECHNIQUE: Multidetector CT imaging of the chest, abdomen and pelvis was performed following the standard protocol without IV contrast. Oral enteric contrast was administered Multidetector CT imaging of the lumbar spine was performed following the standard protocol without IV contrast. COMPARISON:  11/04/2021 FINDINGS: CT CHEST FINDINGS Cardiovascular: Right upper extremity PICC. Normal heart size. Three-vessel coronary artery calcifications. No pericardial effusion. Mediastinum/Nodes: No enlarged mediastinal, hilar, or axillary lymph nodes. Thyroid gland, trachea, and esophagus demonstrate no significant findings. Lungs/Pleura: Mild centrilobular emphysema. Probable mild, underlying pulmonary fibrosis in a bibasilar predominance pattern featuring irregular peripheral interstitial opacity, evaluation limited by the presence of pleural effusions and breath motion artifact. Small bilateral pleural effusions. Musculoskeletal: No chest wall mass or suspicious bone lesions identified. CT ABDOMEN PELVIS FINDINGS Hepatobiliary: No solid liver abnormality is seen. No gallstones, gallbladder wall thickening, or biliary dilatation. Pancreas: Unremarkable. No pancreatic ductal dilatation or surrounding inflammatory changes. Spleen: Normal in size without  significant abnormality. Adrenals/Urinary Tract: Adrenal glands are unremarkable. Status post cystoprostatectomy and right lower quadrant ileal conduit urinary diversion. Mild bilateral hydronephrosis. Bilateral double-J ureteral stents in position. The left renal pelvic pigtail is incompletely formed (series 2, image 76). Stomach/Bowel: Stomach is within normal limits. Appendix appears normal. The colon and rectum is diffusely distended and air and fluid-filled, measuring up to 8.5 cm in caliber. Vascular/Lymphatic: No significant vascular findings are present. No enlarged abdominal or pelvic lymph nodes. Reproductive: No mass or other abnormality. Other: Severe anasarca about the lower abdomen and pelvis (series 2, image 117). Subcutaneous emphysema seen on prior examination is almost entirely resolved (series 2, image 114). There is a large, elongated air and fluid collection in the left hemiabdomen about the left paracolic gutter, low midline abdomen, and  pelvis, although difficult to exactly measure, at least 25 point by 8.0 x 6.2 cm (series 2, image 103, series 5, image 55, series 2, image 115). Musculoskeletal: No acute or significant osseous findings. LUMBAR SPINE: Alignment: Normal. Disc spaces: Status post posterior discectomy and fusion of L2 through S1 with partially incorporated disc spaces. No evidence of perihardware fracture or loosening. Vertebral bodies: Intact. IMPRESSION: 1. No evidence of bowel obstruction. The colon rectum are diffusely distended and air and fluid-filled, consistent with ileus. 2. There is a large, elongated air and fluid collection in the left hemiabdomen about the left paracolic gutter, low midline abdomen, and pelvis, although difficult to exactly measure, at least 25.5 cm in greatest dimension. Findings are consistent with abscess. 3. Status post cystoprostatectomy and right lower quadrant ileal conduit urinary diversion. 4. Mild bilateral hydronephrosis. Bilateral double-J  ureteral stents in position. The left renal pelvic pigtail is incompletely formed. 5. Small bilateral pleural effusions. 6. Probable mild, underlying pulmonary fibrosis in a bibasilar predominance pattern featuring irregular peripheral interstitial opacity, evaluation limited by the presence of pleural effusions and breath motion artifact. This could be further evaluated by ILD protocol CT on an outpatient basis when clinically appropriate. 7. Coronary artery disease. 8. Status post posterior discectomy and fusion of L2 through S1 with partially incorporated disc spaces. No evidence of perihardware fracture or loosening. No acute findings of the lumbar spine. These results will be called to the ordering clinician or representative by the Radiologist Assistant, and communication documented in the PACS or [employer]. Electronically Signed   By: Delanna Ahmadi M.D.   On: 11/17/2021 12:00   DG CHEST PORT 1 VIEW  Result Date: 11/17/2021 CLINICAL DATA:  Back pain, weakness, short of breath EXAM: PORTABLE CHEST 1 VIEW COMPARISON:  11/10/2021 FINDINGS: Central line removed.  NG removed. Mild right lower lobe airspace disease unchanged Improvement in left lower lobe airspace disease and small left effusion. Negative for pulmonary edema. IMPRESSION: Mild improvement in left lower lobe airspace disease and left effusion. No change in right lower lobe airspace disease. Electronically Signed   By: Franchot Gallo M.D.   On: 11/17/2021 10:50   CT IMAGE GUIDED DRAINAGE BY PERCUTANEOUS CATHETER  Result Date: 11/18/2021 INDICATION: 80 year old male with fluid collection lower abdomen referred for drainage EXAM: CT GUIDED DRAINAGE OF ABDOMINAL ABSCESS MEDICATIONS: The patient is currently admitted to the hospital and receiving intravenous antibiotics. The antibiotics were administered within an appropriate time frame prior to the initiation of the procedure. ANESTHESIA/SEDATION: 0 mg IV Versed 25 mcg IV Fentanyl  Moderate Sedation Time:  0 moderate sedation not used. The patient was continuously monitored during the procedure by the interventional radiology nurse under my direct supervision. COMPLICATIONS: None TECHNIQUE: Informed written consent was obtained from the patient after a thorough discussion of the procedural risks, benefits and alternatives. All questions were addressed. Maximal Sterile Barrier Technique was utilized including caps, mask, sterile gowns, sterile gloves, sterile drape, hand hygiene and skin antiseptic. A timeout was performed prior to the initiation of the procedure. PROCEDURE: The left lower quadrant was prepped with chlorhexidine in a sterile fashion, and a sterile drape was applied covering the operative field. A sterile gown and sterile gloves were used for the procedure. Local anesthesia was provided with 1% Lidocaine. After scout CT was acquired, the patient is prepped and draped in the usual sterile fashion. 1% lidocaine was used for local anesthesia. Using CT guidance, modified Seldinger technique was used to place a 12 [place] drain  into the fluid of the left lower quadrant. Sample sent for culture. Catheter was sutured in position and attached to gravity bag. Final CT was acquired. Proximally 350 cc of fluid removed. Patient tolerated the procedure well and remained hemodynamically stable throughout. No complications were encountered and no significant blood loss. FINDINGS: Twelve French drain into left lower quadrant fluid collection with near complete decompression. Approximally 3 in 50 cc of thin turbid yellow fluid IMPRESSION: Status post CT-guided drainage of left lower quadrant fluid collection. Signed, Dulcy Fanny. Dellia Nims, RPVI [facility] Electronically Signed   By: Corrie Mckusick D.O.   On: 11/18/2021 12:20    Anti-infectives: Anti-infectives (From admission, onward)    Start     Dose/Rate Route Frequency Ordered Stop    11/20/21 1000  anidulafungin (ERAXIS) 100 mg in sodium chloride 0.9 % 100 mL IVPB        100 mg 78 mL/hr over 100 Minutes Intravenous Every 24 hours 11/19/21 0729     11/19/21 1200  fluconazole (DIFLUCAN) IVPB 200 mg  Status:  Discontinued        200 mg 100 mL/hr over 60 Minutes Intravenous Every 24 hours 11/18/21 0852 11/19/21 0729   11/19/21 0900  anidulafungin (ERAXIS) 200 mg in sodium chloride 0.9 % 200 mL IVPB        200 mg 78 mL/hr over 200 Minutes Intravenous  Once 11/19/21 0729     11/18/21 0945  fluconazole (DIFLUCAN) IVPB 800 mg        800 mg 200 mL/hr over 120 Minutes Intravenous  Once 11/18/21 0852 11/18/21 1204   11/17/21 1600  piperacillin-tazobactam (ZOSYN) IVPB 3.375 g        3.375 g 12.5 mL/hr over 240 Minutes Intravenous Every 8 hours 11/17/21 1333     11/12/21 0800  Ampicillin-Sulbactam (UNASYN) 3 g in sodium chloride 0.9 % 100 mL IVPB  Status:  Discontinued        3 g 200 mL/hr over 30 Minutes Intravenous Every 6 hours 11/12/21 0712 11/17/21 1333   11/10/21 1400  cefTRIAXone (ROCEPHIN) 2 g in sodium chloride 0.9 % 100 mL IVPB  Status:  Discontinued        2 g 200 mL/hr over 30 Minutes Intravenous Daily 11/10/21 1048 11/12/21 0712   11/10/21 0800  ceFEPIme (MAXIPIME) 2 g in sodium chloride 0.9 % 100 mL IVPB  Status:  Discontinued        2 g 200 mL/hr over 30 Minutes Intravenous Every 12 hours 11/10/21 0618 11/10/21 1048   11/10/21 0630  ceFEPIme (MAXIPIME) 2 g in sodium chloride 0.9 % 100 mL IVPB  Status:  Discontinued        2 g 200 mL/hr over 30 Minutes Intravenous Every 24 hours 11/15/2021 1622 11/10/21 0618   11/13/2021 1624  vancomycin variable dose per unstable renal function (pharmacist dosing)  Status:  Discontinued         Does not apply See admin instructions 11/28/2021 1624 11/10/21 0613   11/13/2021 1030  ceFEPIme (MAXIPIME) 2 g in sodium chloride 0.9 % 100 mL IVPB        2 g 200 mL/hr over 30 Minutes Intravenous  Once 11/05/2021 1016 11/06/2021 1133   11/08/2021  1030  metroNIDAZOLE (FLAGYL) IVPB 500 mg        500 mg 100 mL/hr over 60 Minutes Intravenous  Once 11/20/2021 1016 11/03/2021 1915   11/06/2021 1030  vancomycin (VANCOCIN) IVPB 1000 mg/200 mL premix  Status:  Discontinued        1,000 mg 200 mL/hr over 60 Minutes Intravenous  Once 10/31/2021 1016 11/08/2021 1023   11/08/2021 1030  vancomycin (VANCOREADY) IVPB 1500 mg/300 mL        1,500 mg 150 mL/hr over 120 Minutes Intravenous  Once 11/14/2021 1023 11/12/2021 1341       Assessment/Plan: leus, IA fluid collection s/p drainage - s/p Robotic-assisted laparoscopic radical cystectomy, radical prostatectomy, Bilateral pelvic lymph node dissection, Extensive laparoscopic adhesiolysis, Small bowel resection, Ileal conduit urinary diversion 10/25/21 [name] - CT scan 11/10 with SBO pattern, possibly a pSBO at the level of the surgical anastomosis  - seemed to be clinically improving, NG removed 11/14 PM, started on CLD, developed emesis 11/15.  - due for IR drain today, urology following- will be available as needed -will sign off, care per urology they should probably be following him as he is postop patient and can direct care for abdomen ID - maxipime/ flagyl/ vancomycin 11/10, switched to Rocephin then Unasyn based on urine cx per primary team VTE - SCDs, sq heparin FEN - npo Foley - none. Urostomy/Ileal conduit      Rolm Bookbinder 11/19/2021

## 2021-11-19 NOTE — Progress Notes (Signed)
PHARMACY - TOTAL PARENTERAL NUTRITION CONSULT NOTE   Indication: Prolonged ileus  Patient Measurements: Height: 5\' 7"  (170.2 cm) Weight: 86.8 kg (191 lb 5.8 oz) IBW/kg (Calculated) : 66.1 TPN AdjBW (KG): 72.1 Body mass index is 29.97 kg/m.   Assessment: 14 y/oM with PMH of bladder cancer who underwent cystectomy and ileal conduit on 10/25/21 re-admitted on 10/31/2021 with septic shock secondary to urinary source. CT concerning for SBO. NG removed 11/13/21 PM and started on CLD, but then developed emesis 11/14/21. He remains distended, having hiccups, and not taking in much PO. Also continues to have flatus and liquid stools. Consistent with pSBO/ileus picture per surgery. Pharmacy consulted for TPN dosing.   Glucose / Insulin: No hx of DM. CBgs controlled on SSI Electrolytes: wnl exc K borderline low at 3.5. CoCa 8.84 Renal: AKI. SCr and BUN trending up Hepatic: LFTs WNL. Albumin 1.7. Prealbumin < 5 (11/11) Triglycerides:  98 (11/18) Intake / Output; MIVF: Restrict fluids as much as possible due to volume overload. UOP charted 4724mL. 11/19-Drain placement per Radiology S/p albumin 12.5 g on 11/18 and 11/19 GI Imaging: AXR: Abnormal dilation of small-bowel loops with interval worsening suggesting ileus or partial obstruction. GI Surgeries / Procedures: Robotic-assisted laparoscopic radical cystectomy, radical prostatectomy, bilateral pelvic lymph node dissection, extensive laparoscopic adhesiolysis, small bowel resection, ileal conduit urinary diversion on 10/25/21  11/19 Image guided drain placement, LLQ per IR  Central access: PICC placed 11/16/21 TPN start date: 11/16/21 TPN held starting 11/19  Nutritional Goals: Goal TPN rate is 110 mL/hr (provides 111 g of protein and 2155 kcals per day)  RD Assessment: Estimated Needs Total Energy Estimated Needs: 2030-2275 kcal Total Protein Estimated Needs: 100-115 grams Total Fluid Estimated Needs: >/= 2.2 L/day  Current Nutrition:   CLD TPN on hold  Plan:  KCl 10 mEq IV q1h x 2 today  Continue to hold TPN for now due to new candidemia. Currently on Eraxis. Primary and ID aware. F/U replacement of lines including PICC and ability to restart nutrition when appropriate  Royetta Asal, PharmD, [place] Please utilize [employer] for appropriate phone number to reach the unit pharmacist ([place]) 11/19/2021 11:29 AM

## 2021-11-19 NOTE — Progress Notes (Signed)
PROGRESS NOTE    [name]  NOM:[id number] DOB: 07-Mar-1941 DOA: 11/14/2021 PCP: [name], MD    Brief Narrative:  William [name] is a 80 year old male with past medical history significant for bladder cancer s/p robotic assisted laparoscopic radical cystectomy/prostatectomy on 10/25/21 by [name], bilateral pelvic lymph node dissection, extensive laparoscopic adhesiolysis, small bowel resection with ileal conduit, urinary diversion 10/26/21 by [name] (discharged by urology on post op day#8 11/02/21), COPD, history of tobacco use disorder who presented to [facility] ED on 11/26/2021 from urology clinic with finding of low blood pressure with SBP in the 50's.  Work-up revealed septic shock secondary to Klebsiella oxytoca/Enterococcus faecalis UTI and Klebsiella oxytoca bacteremia, POA.  Was placed on vasopressors and admitted to the ICU.  Care transferred to [facility], Hospitalist service, on 11/11/2021.  Hospital course complicated by ileus/partial small bowel obstruction for which he was seen by general surgery, signed off on 11/19/2021.  Anasarca and worsening renal failure for which nephrology was consulted.  He also developed a large intra-abdominal abscess measuring 25.5 cm seen on CT scan taken on 11/17/2021.  Due to worsening leukocytosis and tachycardia, blood cultures were obtained, 2 out of 2 bottles positive for Candida albicans.  Started on IV Diflucan.  Infectious disease was consulted, IV Diflucan switched to Eraxis due to concern for intra-abdominal abscess as a source of the fungemia.  Ophthalmology, [name] Close, will see in consultation.  The intra-abdominal abscess was drained by interventional radiology [name] on 11/18/2021.  11/19/2021: The patient was seen and examined at his bedside.  His wife was present in the room.  He is alert and oriented x3.  States he feels better today.  His abdominal pain is improved on IV narcotics.  His diarrhea has also improved.  C. difficile  PCR negative.  Assessment & Plan:   Principal Problem:   Septic shock ([place]) Active Problems:   Chronic pain   Bladder cancer ([place])   Bacteremia due to Gram-negative bacteria   UTI due to Klebsiella species  Septic shock, sepsis criteria is improving, not on vasopressor, secondary to Klebsiella oxytoca/Enterococcus faecalis UTI/Klebsiella oxytoca bacteremia, newly diagnosed Candida albicans fungemia Patient presenting to ED from urology clinic after being found with systolic blood pressures in the 50s.  Received Levophed vasopressor, and was admitted to the ICU.  Then transferred to [facility] on 11/11/2021.  Source of infection thought to be from urinary tract.   Patient was started on empiric antibiotics initially with vancomycin, cefepime, metronidazole and was de-escalated to ceftriaxone based on cultures, then later de-escalated to Unasyn.  Off pressors and maintaining a MAP greater than 65.  Unasyn was switched to Zosyn on 11/17/2021 due to intra-abdominal abscess finding. Blood cultures obtained on 11/17/2021 positive for Candida albicans.  Infectious disease and ophthalmology consulted. Procalcitonin down trended 0.74, peaked at 41.64 on 11/24/2021. --Blood cultures x2: Klebsiella oxytoca --Urine culture > 100,000 colonies of Klebsiella oxytoca and 100,000 colonies of Enterococcus faecalis. Leukocytosis improving, 13,000 and 24,000 2 days ago.  She Continue to monitor fever curve and WBC.  Intra-abdominal abscess seen on CT scan measuring 25.5 cm status post image guided drain placement left lower quadrant by interventional radiology [name] on 11/18/2021. Switched Unasyn to Zosyn 11/17/21 Eraxis started on 11/18/2021. Continue closely monitor WBC and fever curve.  Newly diagnosed Candida albicans fungemia From blood cultures drawn on 11/17/2021, 2 out of 2 bottles. IV Diflucan started on 11/18/2021, switched to Eraxis due to intra-abdominal abscess suspected as a  source of the  fungemia. Infectious disease consulted Ophthalmology also consulted for eye exam.  Small bowel obstruction, now ileus/partial small bowel obstruction. CT abdomen/pelvis on admission with concern for small bowel obstruction with possible transition point in the lower abdomen/pelvis near surgical anastomosis site. NG tube removed on 11/14/2021. --General surgery/urology following, appreciate assistance Is currently on full liquid diet.  TPN on hold since 11/18/21 due to fungemia. Continue to optimize magnesium and potassium levels. Continue to mobilize as tolerated. Goal potassium greater than 4.0 Goal magnesium greater than 2.0. Continue to mobilize as tolerated CT abdomen pelvis ordered, follow results.  Anasarca suspect secondary to hypoalbuminemia, severe anemia, acute CHF Has received IV albumin 12.5 mg every 6 hours x2 doses. Management per nephrology.  Acute hypoxic respiratory failure secondary to bilateral pleural effusions Not on oxygen supplementation at baseline Currently on 4 L O2 saturation of 98 to 99% Wean off oxygen supplementation as tolerated Incentive spirometer/flutter valve.  Bilateral pleural effusion secondary to volume overload Has received IV Lasix Continue incentive spirometer and flutter valve Continue to mobilize as tolerated Appreciate nephrology's assistance. CT chest ordered, personally reviewed showing bilateral pleural effusions with bilateral atelectasis.  Sinus tachycardia suspect related to sepsis and ongoing lung physiology HR 120-130's Continue to treat underlying conditions PRN Lopressor 2.5 mg q6H if HR is consistently>130  Nonoliguric AKI suspect secondary to intravascular volume depletion Etiology likely secondary to prolonged prerenal state from  --Cr 2.87>3.00>1.67>1.21>1.04>0.90>0.86> 1.74> 2.56. Off IV fluid Has received IV albumin 12.5 mg every 6 hours x2 doses. TPN started on 11/16/2021, held on 11/18/2021 due to  candidemia.  Improving severe left flank/back pain secondary to intra-abdominal large abscess seen on CT scan. At baseline on gabapentin, oxycodone, Zanaflex. No acute findings on CT lumbar spine. Continue pain control with IV narcotics as needed for severe pain  Acute on chronic diastolic CHF, likely exacerbated by volume overload BNP 323 Bilateral pleural effusions Mild pulmonary edema Continue strict I's and O's and daily weight  Oral thrush Continue IV Eraxis Continue oral care  Resolved refractory hypokalemia Potassium 3.6 from 3.1 goal potassium greater than 4.0 in the setting of bowel obstruction. Magnesium level at goal 2.0. Continue to replete electrolytes as indicated.  Bladder cancer s/p radical cystectomy, prostatectomy and ileal conduit by urology [name] on 10/25/2021, and 10/26/2021. Underwent robotic assisted laparoscopic radical cystectomy, robotic radical prostatectomy, bilateral pelvic lymph node dissection, extensive laparoscopic adhesiolysis, small bowel resection and ileal conduit urinary diversion on 10/25/2021 and on 10/26/2021 by [name]. --Urology following, appreciate assistance Continue ostomy care;  Continue to monitor urinary output  COPD History of tobacco abuse --Incruse Ellipta 1 puff daily --DuoNebs as needed Continue to maintain O2 saturation greater than 90%.  Normocytic anemia  Chemotherapy 8 months ago 9.2>9.5>9.2>8.8>9.4>9.2>8.9> 8.3> 9.6> 7.5. --Transfuse for hemoglobin less than 7.0 or active bleeding Hemoglobin is downtrending continue to closely monitor H&H. Will obtain iron studies, follow results, IV Feraheme if low.  Suspected ILD Noted on CT imaging this admission.  History of tobacco abuse. --Flonase --Outpatient pulmonology follow-up  Weakness/deconditioning/debility: --PT/OT recommending home health on discharge. Continue to mobilize as tolerated Continue fall precautions.  Severe protein calorie  malnutrition. Albumin 1.7 on 11/17/2021 Moderate muscle mass loss. Encourage oral intake as tolerated when not NPO. Oral supplement when can tolerate p.o.   Critical care time: 65 minutes.   DVT prophylaxis: heparin injection 5,000 Units Start: 11/18/2021 1515 SCDs Start: 11/18/2021 1502   Code Status: Full Code Family Communication: Updated his wife at  bedside on 11/19/2021.  Disposition Plan:  Level of care: Stepdown, Transfer to stepdown unit on 11/17/21. Status is: Inpatient  Remains inpatient appropriate because: Continues on IV antibiotics, NG tube removed today, general surgery advancing diet, needs to demonstrate toleration of diet before able discharge home.   Consultants:  PCCM - signed off 11/12 General surgery Urology  Procedures:  Central line placement 11/10  Antimicrobials:  Ceftriaxone 11/11 - 11/13 Vancomycin 11/10 - 11/11 Cefepime 11/10 - 11/11 Metronidazole 11/10 - 11/10 Unasyn 11/13>>     Objective: Vitals:   11/19/21 1000 11/19/21 1100 11/19/21 1200 11/19/21 1222  BP: 105/67 (!) 104/54 113/73   Pulse: (!) 121 (!) 114 (!) 116   Resp: 18 14 (!) 22   Temp:    98.2 F (36.8 C)  TempSrc:    Oral  SpO2: 100% 99% 98%   Weight:      Height:        Intake/Output Summary (Last 24 hours) at 11/19/2021 1334 Last data filed at 11/19/2021 1100 Gross per 24 hour  Intake 2021.25 ml  Output 5525 ml  Net -3503.75 ml   Filed Weights   11/17/21 1741 11/18/21 0500 11/19/21 0359  Weight: 90.1 kg 89.5 kg 86.8 kg    Examination:  General exam: Frail-appearing in no acute distress.  He is alert oriented x3.   Respiratory system: Mild rales at bases no wheezing noted.  Poor inspiratory effort.   Cardiovascular system: Regular rate and rhythm with no rubs or gallops.  No JVD or thyromegaly noted.   Gastrointestinal system: Mildly distended.  Mildly tender.  Bowel sounds present.   Central nervous system: Alert and awake.  Moves all 4 extremities.    Extremities: Anasarca, 2+ pitting edema lower extremities bilaterally.  Dependent edema noted in the upper extremities. Skin: No rashes or ulcerative lesions noted. Psychiatry: Mood is appropriate for condition and setting.  Data Reviewed: I have personally reviewed following labs and imaging studies  CBC: Recent Labs  Lab 11/16/21 1201 11/17/21 0521 11/18/21 0850 11/18/21 1928 11/19/21 0358  WBC 20.7* 24.4* 19.6* 18.3* 13.4*  NEUTROABS 17.7*  --   --   --   --   HGB 8.5* 9.6* 6.8* 8.0* 7.5*  HCT 26.9* 30.8* 21.7* 25.4* 23.7*  MCV 93.1 93.6 93.1 92.4 93.7  PLT 314 305 282 287 212   Basic Metabolic Panel: Recent Labs  Lab 11/14/21 0313 11/15/21 0527 11/15/21 1508 11/16/21 0527 11/16/21 1201 11/17/21 0521 11/18/21 0158 11/18/21 1928 11/19/21 0358  NA 141   < > 137 138 136 135 135  --  136  K 3.3*   < > 3.5 3.2* 3.1* 3.6 3.6  --  3.5  CL 106   < > 103 106 104 104 104  --  105  CO2 27   < > 25 25 25 24 24   --  23  GLUCOSE 119*   < > 108* 95 93 126* 119*  --  73  BUN 22   < > 17 17 19 22  31*  --  33*  CREATININE 0.84   < > 0.83 1.07 1.00 1.74* 2.56*  --  2.58*  CALCIUM 7.5*   < > 7.4* 7.4* 7.2* 7.4* 7.2*  --  7.0*  MG 2.1  --  2.0 1.9  --  2.1 2.0  --   --   PHOS  --   --   --  3.3  --  3.0  --  4.5 4.7*   < > =  values in this interval not displayed.   GFR: Estimated Creatinine Clearance: 24.4 mL/min (A) (by C-G formula based on SCr of 2.58 mg/dL (H)). Liver Function Tests: Recent Labs  Lab 11/16/21 1201 11/17/21 0521 11/18/21 0158 11/19/21 0358  AST 25 34 30 26  ALT 16 19 17 15   ALKPHOS 86 96 84 74  BILITOT 0.9 0.8 0.7 0.7  PROT 5.2* 5.5* 5.6* 4.8*  ALBUMIN 1.7* 1.7* 1.8* 1.7*   Recent Labs  Lab 11/17/21 1020  LIPASE 53*   No results for input(s): AMMONIA in the last 168 hours. Coagulation Profile: Recent Labs  Lab 11/18/21 1928  INR 1.3*    Cardiac Enzymes: No results for input(s): CKTOTAL, CKMB, CKMBINDEX, TROPONINI in the last 168  hours. BNP (last 3 results) No results for input(s): PROBNP in the last 8760 hours. HbA1C: No results for input(s): HGBA1C in the last 72 hours. CBG: Recent Labs  Lab 11/18/21 1611 11/19/21 0004 11/19/21 0747 11/19/21 0811 11/19/21 1156  GLUCAP 95 82 55* 138* 137*   Lipid Profile: Recent Labs    11/17/21 0521  TRIG 98   Thyroid Function Tests: No results for input(s): TSH, T4TOTAL, FREET4, T3FREE, THYROIDAB in the last 72 hours. Anemia Panel: No results for input(s): VITAMINB12, FOLATE, FERRITIN, TIBC, IRON, RETICCTPCT in the last 72 hours.  Sepsis Labs: Recent Labs  Lab 11/13/21 0319 11/14/21 0313 11/16/21 1201 11/17/21 0521 11/17/21 1020 11/18/21 0229  PROCALCITON 4.67 2.62  --  0.74  --   --   LATICACIDVEN  --   --  1.0  --  2.3* 1.0    Recent Results (from the past 240 hour(s))  Culture, blood (routine x 2)     Status: Abnormal (Preliminary result)   Collection Time: 11/17/21  5:10 AM   Specimen: BLOOD  Result Value Ref Range Status   Specimen Description   Final    BLOOD BLOOD LEFT HAND Performed at [facility], [place] [address] [place], [place] [address]    Special Requests   Final    BOTTLES DRAWN AEROBIC ONLY Blood Culture adequate volume Performed at [place] [address], [place], [place] [address]    Culture  Setup Time   Final    YEAST AEROBIC BOTTLE ONLY CRITICAL VALUE NOTED.  VALUE IS CONSISTENT WITH PREVIOUSLY REPORTED AND CALLED VALUE. Performed at [facility], [place] [address] [place], [place] [address]    Culture CANDIDA ALBICANS (A)  Final   Report Status PENDING  Incomplete  Culture, blood (routine x 2)     Status: Abnormal (Preliminary result)   Collection Time: 11/17/21  5:20 AM   Specimen: BLOOD  Result Value Ref Range Status   Specimen Description   Final    BLOOD BLOOD LEFT WRIST Performed at [place] [address], [place], [place] [address]     Special Requests   Final    BOTTLES DRAWN AEROBIC ONLY Blood Culture adequate volume Performed at [place] [address], [place],  [address]    Culture  Setup Time   Final    YEAST AEROBIC BOTTLE ONLY CRITICAL RESULT CALLED TO, READ BACK BY AND VERIFIED WITH: A PHAM PHARMD @0821  11/18/21 EB    Culture (A)  Final    CANDIDA ALBICANS CULTURE REINCUBATED FOR BETTER GROWTH Performed at [facility], [place] [address], [place],  [address]    Report Status PENDING  Incomplete  Blood Culture ID  Panel (Reflexed)     Status: Abnormal   Collection Time: 11/17/21  5:20 AM  Result Value Ref Range Status   Enterococcus faecalis NOT DETECTED NOT DETECTED Final   Enterococcus Faecium NOT DETECTED NOT DETECTED Final   Listeria monocytogenes NOT DETECTED NOT DETECTED Final   Staphylococcus species NOT DETECTED NOT DETECTED Final   Staphylococcus aureus (BCID) NOT DETECTED NOT DETECTED Final   Staphylococcus epidermidis NOT DETECTED NOT DETECTED Final   Staphylococcus lugdunensis NOT DETECTED NOT DETECTED Final   Streptococcus species NOT DETECTED NOT DETECTED Final   Streptococcus agalactiae NOT DETECTED NOT DETECTED Final   Streptococcus pneumoniae NOT DETECTED NOT DETECTED Final   Streptococcus pyogenes NOT DETECTED NOT DETECTED Final   A.calcoaceticus-baumannii NOT DETECTED NOT DETECTED Final   Bacteroides fragilis NOT DETECTED NOT DETECTED Final   Enterobacterales NOT DETECTED NOT DETECTED Final   Enterobacter cloacae complex NOT DETECTED NOT DETECTED Final   Escherichia coli NOT DETECTED NOT DETECTED Final   Klebsiella aerogenes NOT DETECTED NOT DETECTED Final   Klebsiella oxytoca NOT DETECTED NOT DETECTED Final   Klebsiella pneumoniae NOT DETECTED NOT DETECTED Final   Proteus species NOT DETECTED NOT DETECTED Final   Salmonella species NOT DETECTED NOT DETECTED Final   Serratia marcescens NOT DETECTED NOT DETECTED Final   Haemophilus influenzae  NOT DETECTED NOT DETECTED Final   Neisseria meningitidis NOT DETECTED NOT DETECTED Final   Pseudomonas aeruginosa NOT DETECTED NOT DETECTED Final   Stenotrophomonas maltophilia NOT DETECTED NOT DETECTED Final   Candida albicans DETECTED (A) NOT DETECTED Final    Comment: CRITICAL RESULT CALLED TO, READ BACK BY AND VERIFIED WITH: A PHAM PHARMD @0821  11/18/21 EB    Candida auris NOT DETECTED NOT DETECTED Final   Candida glabrata NOT DETECTED NOT DETECTED Final   Candida krusei NOT DETECTED NOT DETECTED Final   Candida parapsilosis NOT DETECTED NOT DETECTED Final   Candida tropicalis NOT DETECTED NOT DETECTED Final   Cryptococcus neoformans/gattii NOT DETECTED NOT DETECTED Final    Comment: Performed at [facility], [address] [place], [place] [address]  MRSA Next Gen by PCR, Nasal     Status: None   Collection Time: 11/17/21  6:24 PM   Specimen: Nasal Mucosa; Nasal Swab  Result Value Ref Range Status   MRSA by PCR Next Gen NOT DETECTED NOT DETECTED Final    Comment: (NOTE) The GeneXpert MRSA Assay (FDA approved for NASAL specimens only), is one component of a comprehensive MRSA colonization surveillance program. It is not intended to diagnose MRSA infection nor to guide or monitor treatment for MRSA infections. Test performance is not FDA approved in patients less than 55 years old. Performed at [facility], [place] [address], [place], [place] [address]   C Difficile Quick Screen (NO PCR Reflex)     Status: None   Collection Time: 11/17/21  8:40 PM   Specimen: STOOL  Result Value Ref Range Status   C Diff antigen NEGATIVE NEGATIVE Final   C Diff toxin NEGATIVE NEGATIVE Final   C Diff interpretation NEGATIVE  Final    Comment: Performed at [facility], [place] [address], [place], [place] [address]  Aerobic/Anaerobic Culture w Gram Stain (surgical/deep wound)     Status: None (Preliminary result)   Collection Time: 11/18/21 11:01 AM    Specimen: Abdomen; Abscess  Result Value Ref Range Status   Specimen Description   Final    ABDOMEN Performed at [place]  [address], [place], [place] [address]    Special Requests   Final    ABSCES Performed at [facility], [place] [address] [place], [place] [address]    Gram Stain   Final    MODERATE WBC PRESENT,BOTH PMN AND MONONUCLEAR NO ORGANISMS SEEN    Culture   Final    NO GROWTH < 24 HOURS Performed at [facility], [place] [address], [place], [place] [address]    Report Status PENDING  Incomplete  Culture, fungus without smear     Status: None (Preliminary result)   Collection Time: 11/18/21 11:02 AM   Specimen: Abscess; Other  Result Value Ref Range Status   Specimen Description   Final    ABSCESS Performed at [place] [address], [place], [place] [address]    Special Requests ABDOMEN  Final   Culture   Final    NO FUNGUS ISOLATED AFTER 1 DAY Performed at [facility], [place] [address], [place], [place] [address]    Report Status PENDING  Incomplete         Radiology Studies: CT IMAGE GUIDED DRAINAGE BY PERCUTANEOUS CATHETER  Result Date: 11/18/2021 INDICATION: 80 year old male with fluid collection lower abdomen referred for drainage EXAM: CT GUIDED DRAINAGE OF ABDOMINAL ABSCESS MEDICATIONS: The patient is currently admitted to the hospital and receiving intravenous antibiotics. The antibiotics were administered within an appropriate time frame prior to the initiation of the procedure. ANESTHESIA/SEDATION: 0 mg IV Versed 25 mcg IV Fentanyl Moderate Sedation Time:  0 moderate sedation not used. The patient was continuously monitored during the procedure by the interventional radiology nurse under my direct supervision. COMPLICATIONS: None TECHNIQUE: Informed written consent was obtained from the patient after a thorough discussion of the procedural risks, benefits and alternatives.  All questions were addressed. Maximal Sterile Barrier Technique was utilized including caps, mask, sterile gowns, sterile gloves, sterile drape, hand hygiene and skin antiseptic. A timeout was performed prior to the initiation of the procedure. PROCEDURE: The left lower quadrant was prepped with chlorhexidine in a sterile fashion, and a sterile drape was applied covering the operative field. A sterile gown and sterile gloves were used for the procedure. Local anesthesia was provided with 1% Lidocaine. After scout CT was acquired, the patient is prepped and draped in the usual sterile fashion. 1% lidocaine was used for local anesthesia. Using CT guidance, modified Seldinger technique was used to place a 12 [place] drain into the fluid of the left lower quadrant. Sample sent for culture. Catheter was sutured in position and attached to gravity bag. Final CT was acquired. Proximally 350 cc of fluid removed. Patient tolerated the procedure well and remained hemodynamically stable throughout. No complications were encountered and no significant blood loss. FINDINGS: Twelve French drain into left lower quadrant fluid collection with near complete decompression. Approximally 3 in 50 cc of thin turbid yellow fluid IMPRESSION: Status post CT-guided drainage of left lower quadrant fluid collection. Signed, Dulcy Fanny. Dellia Nims, RPVI [facility] Radiology Electronically Signed   By: Corrie Mckusick D.O.   On: 11/18/2021 12:20        Scheduled Meds:  chlorhexidine  15 mL Mouth Rinse BID   Chlorhexidine Gluconate Cloth  6 each Topical Daily   feeding supplement  237 mL Oral TID BM   fluticasone  2 spray Each Nare Daily   heparin  5,000 Units Subcutaneous Q8H   insulin aspart  0-9 Units Subcutaneous Q6H  lip balm  1 application Topical BID   mouth rinse  15 mL Mouth Rinse q12n4p   nystatin  5 mL Oral QID   nystatin   Topical BID   sodium chloride flush  10-40 mL  Intracatheter Q12H   sodium chloride flush  10-40 mL Intracatheter Q12H   sodium chloride flush  5 mL Intracatheter Q8H   umeclidinium bromide  1 puff Inhalation Daily   Continuous Infusions:  sodium chloride 75 mL/hr at 11/14/21 2328   [START ON 11/20/2021] anidulafungin     chlorproMAZINE (THORAZINE) IV Stopped (11/18/21 0030)   methocarbamol (ROBAXIN) IV Stopped (11/18/21 2138)   ondansetron (ZOFRAN) IV     piperacillin-tazobactam (ZOSYN)  IV Stopped (11/19/21 1207)   potassium chloride 10 mEq (11/19/21 1239)     LOS: 10 days       Kayleen Memos, DO [facility] Available via Epic secure chat 7am-7pm After these hours, please refer to coverage provider listed on [email or web address] 11/19/2021, 1:34 PM

## 2021-11-19 NOTE — Progress Notes (Signed)
[facility] Progress Note  Subjective: IR did drain placement for abd abscess, 350 cc fluid removed. Repeat blood cx's grew candida albicans. Diflucan added. Creat stable today at 2.58. pt states he is "feeling better".    Vitals:   11/19/21 0100 11/19/21 0200 11/19/21 0300 11/19/21 0359  BP: (!) 101/53 (!) 110/55 122/62   Pulse: (!) 104 (!) 104 (!) 108   Resp: 14 12 19   Temp:    97.7 F (36.5 C)  TempSrc:      SpO2: 98% 99% 99%   Weight:    86.8 kg  Height:        Exam: Gen alert, looks better, less discomfort No jvd or bruits Chest clear bilat to bases, no rales/ wheezing RRR no MRG Abd soft ntnd no mass or ascites +bs Ext diffuse 2+ bilat UE and LE edema Neuro is lethargic, nonfocal and responds appropriately, O x 3   Home meds include - advil, neurontin, percocet prn, zanaflex, spiriva, flonase, prns     Baseline creat from this admission is 0.82- 1.07, eGFR > 60 ml/min.     UA 11/10 - cloudy amber, large H/ LE, 100 prot, many bact, >50 rbc/ wbc   UNa 43, UCr 48     CT kidneys > Adrenals/Urinary Tract: Adrenal glands are unremarkable. Status post cystoprostatectomy and right lower quadrant ileal conduit urinary diversion. Mild bilateral hydronephrosis. Bilateral double-J ureteral stents in position. The left renal pelvic pigtail is incompletely formed (series 2, image 76).    CXR  11/18 - Mild right lower lobe airspace disease unchanged. Improvement in left lower lobe airspace disease and small left effusion. Negative for pulmonary edema    Assessment/ Plan: AKI - b/l creat 0.82- 1.07, eGFR > 60 ml/min from this admission. SP recent cystectomy/ ileal conduit surgery on 10/26/21. Then admitted here 11/10 w/ urosepsis d/t Klebsiella. Creat was 3 on admit 11/10, improved to 0.8 and then increased up to 1.7 on 11/18. We were asked to see. UA from 11/10 and 11/18 c/w infection. Urine lytes c/w ATN or euvolemia. Imaging showed chronic bilat hydro and bilat ureteral  stents, cystoprostatectomy and ileal conduit. Nonoliguric AKI likely due to sepsis/ abscess. Gave IV lasix but dc'd due to rising creatinine.  CXR neg for edema 11/18. Abdominal abscess drained per IR 11/19. Creat stable today and UOP better/ very good. No indication for RRT at this time. Cont rx for sepsis/ infection. Will follow.  Klebsiella/ candida bacteremia - getting IV zosyn and diflucan now.  Vol overload - as above SP recent cystectomy w/ ileal conduit - on 10/27, for bladder cancer Nutrition - getting TPN Hypokalemia - getting replaced, 3.5 today        Rob  11/19/2021, 6:09 AM   Recent Labs  Lab 11/18/21 0158 11/18/21 0850 11/18/21 1928 11/19/21 0358  K 3.6  --   --  3.5  BUN 31*  --   --  33*  CREATININE 2.56*  --   --  2.58*  CALCIUM 7.2*  --   --  7.0*  PHOS  --   --  4.5 4.7*  HGB  --  6.8* 8.0*  --     Inpatient medications:  chlorhexidine  15 mL Mouth Rinse BID   Chlorhexidine Gluconate Cloth  6 each Topical Daily   feeding supplement  237 mL Oral TID BM   fluticasone  2 spray Each Nare Daily   heparin  5,000 Units Subcutaneous Q8H   insulin aspart    0-9 Units Subcutaneous Q6H   lip balm  1 application Topical BID   mouth rinse  15 mL Mouth Rinse q12n4p   nystatin  5 mL Oral QID   nystatin   Topical BID   sodium chloride flush  10-40 mL Intracatheter Q12H   sodium chloride flush  10-40 mL Intracatheter Q12H   sodium chloride flush  5 mL Intracatheter Q8H   umeclidinium bromide  1 puff Inhalation Daily    sodium chloride 75 mL/hr at 11/14/21 2328   chlorproMAZINE (THORAZINE) IV Stopped (11/18/21 0030)   fluconazole (DIFLUCAN) IV     methocarbamol (ROBAXIN) IV Stopped (11/18/21 2138)   ondansetron (ZOFRAN) IV     piperacillin-tazobactam (ZOSYN)  IV 3.375 g (11/19/21 0132)   acetaminophen, chlorproMAZINE (THORAZINE) IV, diphenhydrAMINE, HYDROmorphone (DILAUDID) injection, magic mouthwash, menthol-cetylpyridinium, methocarbamol (ROBAXIN) IV,  ondansetron (ZOFRAN) IV **OR** ondansetron (ZOFRAN) IV, phenol, sodium chloride flush, sodium chloride flush

## 2021-11-19 NOTE — Final Consult Note (Signed)
OPHTHALMOLOGY CONSULT NOTE  Date: 11/19/21 Time: 2:50 PM  Patient Name: William [name]  DOB: 18-Sep-1941 MRN: [id number]  Reason for Consult: Diagnosis of fungemia   HPI:  This is a 80 y.o. male for whom ophthalmology was consulted to rule out fungal endophthalmitis.    Prior to Admission medications   Medication Sig Start Date End Date Taking? Authorizing Provider  acetaminophen (TYLENOL) 500 MG tablet Take 1,000 mg by mouth every 4 (four) hours as needed for mild pain or fever.   Yes [provider]  fluticasone (FLONASE) 50 MCG/ACT nasal spray Place 2 sprays into both nostrils daily. 01/05/20  Yes [name], MD  gabapentin (NEURONTIN) 300 MG capsule TAKE 3 CAPSULES BY MOUTH AT BEDTIME Patient taking differently: Take 900 mg by mouth at bedtime. 03/09/21  Yes Patel, [name], DO  ibuprofen (ADVIL) 200 MG tablet Take 600 mg by mouth every 4 (four) hours as needed for mild pain.   Yes [provider]  ondansetron (ZOFRAN-ODT) 8 MG disintegrating tablet Take 8 mg by mouth every 8 (eight) hours as needed for nausea/vomiting. 11/06/21  Yes [provider]  oxyCODONE-acetaminophen (PERCOCET) 5-325 MG tablet Take 1 tablet by mouth every 6 (six) hours as needed for severe pain. 11/02/21 11/02/22 Yes [name], MD  SPIRIVA HANDIHALER 18 MCG inhalation capsule PLACE 1 CAPSULE( 18 MCG TOTAL) INTO INHALER AND INHALE DAILY Patient taking differently: Place 18 mcg into inhaler and inhale daily. 08/28/21  Yes [name], MD  tiZANidine (ZANAFLEX) 4 MG tablet Take 4 mg by mouth at bedtime.   Yes [provider]    Past Medical History:  Diagnosis Date   Bladder cancer ([place])    Pt says small tumor not cancer   Bleeding gums 11/11/2015   COVID 09/23/2020   back ache hurt all over x few days all symptoms reolved   Dyslipidemia (high LDL; low HDL)    Low back pain 12/27/2014   Medial epicondylitis of right elbow 12/13/2014   Numbness and  tingling of right leg    Tinnitus    all the time   Toenail fungus    2 big toes   Wears glasses     family history includes Cancer in his maternal grandfather and maternal grandmother; Diabetes in his father; Drug abuse in his daughter; Heart disease in his father; Obesity in his brother and brother; Stroke in his father and mother.  Social History   Occupational History   Occupation: Retired- Ambulance person  Tobacco Use   Smoking status: Former    Packs/day: 1.50    Years: 30.00    Pack years: 45.00    Types: Cigarettes    Quit date: 04/02/1986    Years since quitting: 35.6   Smokeless tobacco: Never   Tobacco comments:    no plans to start  Vaping Use   Vaping Use: Never used  Substance and Sexual Activity   Alcohol use: Not Currently   Drug use: Never   Sexual activity: Yes    Partners: Female    Allergies  Allergen Reactions   Hydrocodone Other (See Comments)    Causes constipation even with stool softner    ROS: Positive as above, otherwise negative.  EXAM:  Mental Status: A&O x 3   Base Exam: Right Eye Left Eye  Visual Acuity (At near) 20/50+2 20/40-2  IOP (Tonopen) 14 12  Pupillary Exam No RAPD  No RAPD  Motility Full  Full  Confrontation VF Full  Full    Anterior Segment Exam    Lids/Lashes WNL WNL  Conjuctiva White and Quiet White and Quiet  Cornea Clear Clear  Anterior Chamber Deep and Quiet Deep and Quiet  Iris Round, Reactive Round, Reactive  Lens NSC NSC  Vitreous WNL WNL   Poster Segment Exam    Disc Sharp Sharp  CD ratio 0.3 0.3  Macula Flat Flat  Vessels WNL WNL  Periphery Attached Attached   Radiographic Studies Reviewed: None CT IMAGE GUIDED DRAINAGE BY PERCUTANEOUS CATHETER  Result Date: 11/18/2021 INDICATION: 80 year old male with fluid collection lower abdomen referred for drainage EXAM: CT GUIDED DRAINAGE OF ABDOMINAL ABSCESS MEDICATIONS: The patient is currently admitted to the hospital and receiving  intravenous antibiotics. The antibiotics were administered within an appropriate time frame prior to the initiation of the procedure. ANESTHESIA/SEDATION: 0 mg IV Versed 25 mcg IV Fentanyl Moderate Sedation Time:  0 moderate sedation not used. The patient was continuously monitored during the procedure by the interventional radiology nurse under my direct supervision. COMPLICATIONS: None TECHNIQUE: Informed written consent was obtained from the patient after a thorough discussion of the procedural risks, benefits and alternatives. All questions were addressed. Maximal Sterile Barrier Technique was utilized including caps, mask, sterile gowns, sterile gloves, sterile drape, hand hygiene and skin antiseptic. A timeout was performed prior to the initiation of the procedure. PROCEDURE: The left lower quadrant was prepped with chlorhexidine in a sterile fashion, and a sterile drape was applied covering the operative field. A sterile gown and sterile gloves were used for the procedure. Local anesthesia was provided with 1% Lidocaine. After scout CT was acquired, the patient is prepped and draped in the usual sterile fashion. 1% lidocaine was used for local anesthesia. Using CT guidance, modified Seldinger technique was used to place a 12 [place] drain into the fluid of the left lower quadrant. Sample sent for culture. Catheter was sutured in position and attached to gravity bag. Final CT was acquired. Proximally 350 cc of fluid removed. Patient tolerated the procedure well and remained hemodynamically stable throughout. No complications were encountered and no significant blood loss. FINDINGS: Twelve French drain into left lower quadrant fluid collection with near complete decompression. Approximally 3 in 50 cc of thin turbid yellow fluid IMPRESSION: Status post CT-guided drainage of left lower quadrant fluid collection. Signed, Dulcy Fanny. Dellia Nims, RPVI [facility]  Radiology Electronically Signed   By: Corrie Mckusick D.O.   On: 11/18/2021 12:20    Assessment and Recommendation: Rule out fungal endophthalmitis: As expected, given the lack of symptoms in a coherent patient, there are no signs of fungal endophthalmitis at the time of the patients examination. Would recommend the primary team arrange follow up with the patients eye care provider at discharge.   Please call with any questions.  Jola Schmidt MD [facility] [phone]

## 2021-11-19 NOTE — Progress Notes (Signed)
Subjective/Chief Complaint:  1 - Bladder Cancer - s/p cystectomy with lysis of adhesions and small bowel resection 10/25/2021 for stage 2 bladder cancer with negative nodes and margins, Had neoadjuvant chemo prior.    2 - Urinary Diversion - s/p conduit diversion at cystectomy 09/2021. Stents in good position by ER CT this admission.    3 - Sepsis, Pyelonephritis, Abdominal Abscess R/O CDiff- tachycardia, chills, lactic acidosis c/w major sepsis at ER eval 11/10. CT with expected subcutaneous gas (from insuflattion at procedure), some pelvic fluid. NO significant inraperitoneal air, stents in good position. Some dilated small bowel. UCX and BCX c/w coliform pyelo and placed on therapy with improvement. Klebsiella and Enterococcus.  Repeat CT 11/18 with pelvic fluic collection, no bowel obstruction, has been having freqeunt loose stools and worsening leukocytosis as well as abdominal pain.    Today "William [name]" is seen  in f/u above. His infectious parameters are improving. Afebrile in past 24 hours.   Objective: Vital signs in last 24 hours: Temp:  [98 F (36.7 C)-99.2 F (37.3 C)] 98.3 F (36.8 C) (11/20 0800) Pulse Rate:  [104-130] 124 (11/20 0800) Resp:  [12-29] 19 (11/20 0800) BP: (96-151)/(51-84) 110/64 (11/20 0800) SpO2:  [95 %-100 %] 96 % (11/20 0800) Weight:  [86.8 kg] 86.8 kg (11/20 0359) Last BM Date: 11/19/21  Intake/Output from previous day: 11/19 0701 - 11/20 0700 In: 2379.7 [P.O.:720; I.V.:425.1; Blood:289.2; IV Piggyback:895.4] Out: 4709 [Urine:2825; Drains:1000; Stool:950] Intake/Output this shift: No intake/output data recorded.  EXAM: Ill appearing. Some mild increase WOB. Diffuse anasarca. [place] O2 Regular tachycardia Distended tympanic abd. NO rebound / guarding RLQ urostomy pink / patent with distal bilateral stents in place and non-foul urine Edematous scrotum and legs c/w overall edema   Lab Results:  Recent Labs    11/18/21 1928 11/19/21 0358  WBC  18.3* 13.4*  HGB 8.0* 7.5*  HCT 25.4* 23.7*  PLT 287 317   BMET Recent Labs    11/18/21 0158 11/19/21 0358  NA 135 136  K 3.6 3.5  CL 104 105  CO2 24 23  GLUCOSE 119* 73  BUN 31* 33*  CREATININE 2.56* 2.58*  CALCIUM 7.2* 7.0*   PT/INR Recent Labs    11/18/21 1928  LABPROT 16.2*  INR 1.3*   ABG Recent Labs    11/17/21 0955  PHART 7.435  HCO3 22.4    Studies/Results: CT ABDOMEN PELVIS WO CONTRAST  Result Date: 11/17/2021 CLINICAL DATA:  Partial small bowel obstruction versus ileus, status post laparoscopic cystoprostatectomy and ileal conduit urinary diversion, adhesiolysis, small-bowel resection EXAM: CT CHEST, ABDOMEN AND PELVIS WITHOUT CONTRAST CT LUMBAR SPINE WITHOUT CONTRAST TECHNIQUE: Multidetector CT imaging of the chest, abdomen and pelvis was performed following the standard protocol without IV contrast. Oral enteric contrast was administered Multidetector CT imaging of the lumbar spine was performed following the standard protocol without IV contrast. COMPARISON:  11/23/2021 FINDINGS: CT CHEST FINDINGS Cardiovascular: Right upper extremity PICC. Normal heart size. Three-vessel coronary artery calcifications. No pericardial effusion. Mediastinum/Nodes: No enlarged mediastinal, hilar, or axillary lymph nodes. Thyroid gland, trachea, and esophagus demonstrate no significant findings. Lungs/Pleura: Mild centrilobular emphysema. Probable mild, underlying pulmonary fibrosis in a bibasilar predominance pattern featuring irregular peripheral interstitial opacity, evaluation limited by the presence of pleural effusions and breath motion artifact. Small bilateral pleural effusions. Musculoskeletal: No chest wall mass or suspicious bone lesions identified. CT ABDOMEN PELVIS FINDINGS Hepatobiliary: No solid liver abnormality is seen. No gallstones, gallbladder wall thickening, or biliary dilatation. Pancreas: Unremarkable. No pancreatic  ductal dilatation or surrounding inflammatory  changes. Spleen: Normal in size without significant abnormality. Adrenals/Urinary Tract: Adrenal glands are unremarkable. Status post cystoprostatectomy and right lower quadrant ileal conduit urinary diversion. Mild bilateral hydronephrosis. Bilateral double-J ureteral stents in position. The left renal pelvic pigtail is incompletely formed (series 2, image 76). Stomach/Bowel: Stomach is within normal limits. Appendix appears normal. The colon and rectum is diffusely distended and air and fluid-filled, measuring up to 8.5 cm in caliber. Vascular/Lymphatic: No significant vascular findings are present. No enlarged abdominal or pelvic lymph nodes. Reproductive: No mass or other abnormality. Other: Severe anasarca about the lower abdomen and pelvis (series 2, image 117). Subcutaneous emphysema seen on prior examination is almost entirely resolved (series 2, image 114). There is a large, elongated air and fluid collection in the left hemiabdomen about the left paracolic gutter, low midline abdomen, and pelvis, although difficult to exactly measure, at least 25 point by 8.0 x 6.2 cm (series 2, image 103, series 5, image 55, series 2, image 115). Musculoskeletal: No acute or significant osseous findings. LUMBAR SPINE: Alignment: Normal. Disc spaces: Status post posterior discectomy and fusion of L2 through S1 with partially incorporated disc spaces. No evidence of perihardware fracture or loosening. Vertebral bodies: Intact. IMPRESSION: 1. No evidence of bowel obstruction. The colon rectum are diffusely distended and air and fluid-filled, consistent with ileus. 2. There is a large, elongated air and fluid collection in the left hemiabdomen about the left paracolic gutter, low midline abdomen, and pelvis, although difficult to exactly measure, at least 25.5 cm in greatest dimension. Findings are consistent with abscess. 3. Status post cystoprostatectomy and right lower quadrant ileal conduit urinary diversion. 4. Mild  bilateral hydronephrosis. Bilateral double-J ureteral stents in position. The left renal pelvic pigtail is incompletely formed. 5. Small bilateral pleural effusions. 6. Probable mild, underlying pulmonary fibrosis in a bibasilar predominance pattern featuring irregular peripheral interstitial opacity, evaluation limited by the presence of pleural effusions and breath motion artifact. This could be further evaluated by ILD protocol CT on an outpatient basis when clinically appropriate. 7. Coronary artery disease. 8. Status post posterior discectomy and fusion of L2 through S1 with partially incorporated disc spaces. No evidence of perihardware fracture or loosening. No acute findings of the lumbar spine. These results will be called to the ordering clinician or representative by the Radiologist Assistant, and communication documented in the PACS or [employer]. Electronically Signed   By: Delanna Ahmadi M.D.   On: 11/17/2021 12:00   CT CHEST WO CONTRAST  Result Date: 11/17/2021 CLINICAL DATA:  Partial small bowel obstruction versus ileus, status post laparoscopic cystoprostatectomy and ileal conduit urinary diversion, adhesiolysis, small-bowel resection EXAM: CT CHEST, ABDOMEN AND PELVIS WITHOUT CONTRAST CT LUMBAR SPINE WITHOUT CONTRAST TECHNIQUE: Multidetector CT imaging of the chest, abdomen and pelvis was performed following the standard protocol without IV contrast. Oral enteric contrast was administered Multidetector CT imaging of the lumbar spine was performed following the standard protocol without IV contrast. COMPARISON:  10/31/2021 FINDINGS: CT CHEST FINDINGS Cardiovascular: Right upper extremity PICC. Normal heart size. Three-vessel coronary artery calcifications. No pericardial effusion. Mediastinum/Nodes: No enlarged mediastinal, hilar, or axillary lymph nodes. Thyroid gland, trachea, and esophagus demonstrate no significant findings. Lungs/Pleura: Mild centrilobular emphysema. Probable mild,  underlying pulmonary fibrosis in a bibasilar predominance pattern featuring irregular peripheral interstitial opacity, evaluation limited by the presence of pleural effusions and breath motion artifact. Small bilateral pleural effusions. Musculoskeletal: No chest wall mass or suspicious bone lesions identified. CT ABDOMEN PELVIS FINDINGS  Hepatobiliary: No solid liver abnormality is seen. No gallstones, gallbladder wall thickening, or biliary dilatation. Pancreas: Unremarkable. No pancreatic ductal dilatation or surrounding inflammatory changes. Spleen: Normal in size without significant abnormality. Adrenals/Urinary Tract: Adrenal glands are unremarkable. Status post cystoprostatectomy and right lower quadrant ileal conduit urinary diversion. Mild bilateral hydronephrosis. Bilateral double-J ureteral stents in position. The left renal pelvic pigtail is incompletely formed (series 2, image 76). Stomach/Bowel: Stomach is within normal limits. Appendix appears normal. The colon and rectum is diffusely distended and air and fluid-filled, measuring up to 8.5 cm in caliber. Vascular/Lymphatic: No significant vascular findings are present. No enlarged abdominal or pelvic lymph nodes. Reproductive: No mass or other abnormality. Other: Severe anasarca about the lower abdomen and pelvis (series 2, image 117). Subcutaneous emphysema seen on prior examination is almost entirely resolved (series 2, image 114). There is a large, elongated air and fluid collection in the left hemiabdomen about the left paracolic gutter, low midline abdomen, and pelvis, although difficult to exactly measure, at least 25 point by 8.0 x 6.2 cm (series 2, image 103, series 5, image 55, series 2, image 115). Musculoskeletal: No acute or significant osseous findings. LUMBAR SPINE: Alignment: Normal. Disc spaces: Status post posterior discectomy and fusion of L2 through S1 with partially incorporated disc spaces. No evidence of perihardware fracture or  loosening. Vertebral bodies: Intact. IMPRESSION: 1. No evidence of bowel obstruction. The colon rectum are diffusely distended and air and fluid-filled, consistent with ileus. 2. There is a large, elongated air and fluid collection in the left hemiabdomen about the left paracolic gutter, low midline abdomen, and pelvis, although difficult to exactly measure, at least 25.5 cm in greatest dimension. Findings are consistent with abscess. 3. Status post cystoprostatectomy and right lower quadrant ileal conduit urinary diversion. 4. Mild bilateral hydronephrosis. Bilateral double-J ureteral stents in position. The left renal pelvic pigtail is incompletely formed. 5. Small bilateral pleural effusions. 6. Probable mild, underlying pulmonary fibrosis in a bibasilar predominance pattern featuring irregular peripheral interstitial opacity, evaluation limited by the presence of pleural effusions and breath motion artifact. This could be further evaluated by ILD protocol CT on an outpatient basis when clinically appropriate. 7. Coronary artery disease. 8. Status post posterior discectomy and fusion of L2 through S1 with partially incorporated disc spaces. No evidence of perihardware fracture or loosening. No acute findings of the lumbar spine. These results will be called to the ordering clinician or representative by the Radiologist Assistant, and communication documented in the PACS or [employer]. Electronically Signed   By: Delanna Ahmadi M.D.   On: 11/17/2021 12:00   CT LUMBAR SPINE WO CONTRAST  Result Date: 11/17/2021 CLINICAL DATA:  Partial small bowel obstruction versus ileus, status post laparoscopic cystoprostatectomy and ileal conduit urinary diversion, adhesiolysis, small-bowel resection EXAM: CT CHEST, ABDOMEN AND PELVIS WITHOUT CONTRAST CT LUMBAR SPINE WITHOUT CONTRAST TECHNIQUE: Multidetector CT imaging of the chest, abdomen and pelvis was performed following the standard protocol without IV contrast.  Oral enteric contrast was administered Multidetector CT imaging of the lumbar spine was performed following the standard protocol without IV contrast. COMPARISON:  11/12/2021 FINDINGS: CT CHEST FINDINGS Cardiovascular: Right upper extremity PICC. Normal heart size. Three-vessel coronary artery calcifications. No pericardial effusion. Mediastinum/Nodes: No enlarged mediastinal, hilar, or axillary lymph nodes. Thyroid gland, trachea, and esophagus demonstrate no significant findings. Lungs/Pleura: Mild centrilobular emphysema. Probable mild, underlying pulmonary fibrosis in a bibasilar predominance pattern featuring irregular peripheral interstitial opacity, evaluation limited by the presence of pleural effusions and breath motion  artifact. Small bilateral pleural effusions. Musculoskeletal: No chest wall mass or suspicious bone lesions identified. CT ABDOMEN PELVIS FINDINGS Hepatobiliary: No solid liver abnormality is seen. No gallstones, gallbladder wall thickening, or biliary dilatation. Pancreas: Unremarkable. No pancreatic ductal dilatation or surrounding inflammatory changes. Spleen: Normal in size without significant abnormality. Adrenals/Urinary Tract: Adrenal glands are unremarkable. Status post cystoprostatectomy and right lower quadrant ileal conduit urinary diversion. Mild bilateral hydronephrosis. Bilateral double-J ureteral stents in position. The left renal pelvic pigtail is incompletely formed (series 2, image 76). Stomach/Bowel: Stomach is within normal limits. Appendix appears normal. The colon and rectum is diffusely distended and air and fluid-filled, measuring up to 8.5 cm in caliber. Vascular/Lymphatic: No significant vascular findings are present. No enlarged abdominal or pelvic lymph nodes. Reproductive: No mass or other abnormality. Other: Severe anasarca about the lower abdomen and pelvis (series 2, image 117). Subcutaneous emphysema seen on prior examination is almost entirely resolved  (series 2, image 114). There is a large, elongated air and fluid collection in the left hemiabdomen about the left paracolic gutter, low midline abdomen, and pelvis, although difficult to exactly measure, at least 25 point by 8.0 x 6.2 cm (series 2, image 103, series 5, image 55, series 2, image 115). Musculoskeletal: No acute or significant osseous findings. LUMBAR SPINE: Alignment: Normal. Disc spaces: Status post posterior discectomy and fusion of L2 through S1 with partially incorporated disc spaces. No evidence of perihardware fracture or loosening. Vertebral bodies: Intact. IMPRESSION: 1. No evidence of bowel obstruction. The colon rectum are diffusely distended and air and fluid-filled, consistent with ileus. 2. There is a large, elongated air and fluid collection in the left hemiabdomen about the left paracolic gutter, low midline abdomen, and pelvis, although difficult to exactly measure, at least 25.5 cm in greatest dimension. Findings are consistent with abscess. 3. Status post cystoprostatectomy and right lower quadrant ileal conduit urinary diversion. 4. Mild bilateral hydronephrosis. Bilateral double-J ureteral stents in position. The left renal pelvic pigtail is incompletely formed. 5. Small bilateral pleural effusions. 6. Probable mild, underlying pulmonary fibrosis in a bibasilar predominance pattern featuring irregular peripheral interstitial opacity, evaluation limited by the presence of pleural effusions and breath motion artifact. This could be further evaluated by ILD protocol CT on an outpatient basis when clinically appropriate. 7. Coronary artery disease. 8. Status post posterior discectomy and fusion of L2 through S1 with partially incorporated disc spaces. No evidence of perihardware fracture or loosening. No acute findings of the lumbar spine. These results will be called to the ordering clinician or representative by the Radiologist Assistant, and communication documented in the PACS or  [employer]. Electronically Signed   By: Delanna Ahmadi M.D.   On: 11/17/2021 12:00   CT IMAGE GUIDED DRAINAGE BY PERCUTANEOUS CATHETER  Result Date: 11/18/2021 INDICATION: 80 year old male with fluid collection lower abdomen referred for drainage EXAM: CT GUIDED DRAINAGE OF ABDOMINAL ABSCESS MEDICATIONS: The patient is currently admitted to the hospital and receiving intravenous antibiotics. The antibiotics were administered within an appropriate time frame prior to the initiation of the procedure. ANESTHESIA/SEDATION: 0 mg IV Versed 25 mcg IV Fentanyl Moderate Sedation Time:  0 moderate sedation not used. The patient was continuously monitored during the procedure by the interventional radiology nurse under my direct supervision. COMPLICATIONS: None TECHNIQUE: Informed written consent was obtained from the patient after a thorough discussion of the procedural risks, benefits and alternatives. All questions were addressed. Maximal Sterile Barrier Technique was utilized including caps, mask, sterile gowns, sterile gloves, sterile drape,  hand hygiene and skin antiseptic. A timeout was performed prior to the initiation of the procedure. PROCEDURE: The left lower quadrant was prepped with chlorhexidine in a sterile fashion, and a sterile drape was applied covering the operative field. A sterile gown and sterile gloves were used for the procedure. Local anesthesia was provided with 1% Lidocaine. After scout CT was acquired, the patient is prepped and draped in the usual sterile fashion. 1% lidocaine was used for local anesthesia. Using CT guidance, modified Seldinger technique was used to place a 12 [place] drain into the fluid of the left lower quadrant. Sample sent for culture. Catheter was sutured in position and attached to gravity bag. Final CT was acquired. Proximally 350 cc of fluid removed. Patient tolerated the procedure well and remained hemodynamically stable throughout. No complications were  encountered and no significant blood loss. FINDINGS: Twelve French drain into left lower quadrant fluid collection with near complete decompression. Approximally 3 in 50 cc of thin turbid yellow fluid IMPRESSION: Status post CT-guided drainage of left lower quadrant fluid collection. Signed, Dulcy Fanny. Dellia Nims, RPVI [facility] Electronically Signed   By: Corrie Mckusick D.O.   On: 11/18/2021 12:20    Anti-infectives: Anti-infectives (From admission, onward)    Start     Dose/Rate Route Frequency Ordered Stop   11/20/21 1000  anidulafungin (ERAXIS) 100 mg in sodium chloride 0.9 % 100 mL IVPB        100 mg 78 mL/hr over 100 Minutes Intravenous Every 24 hours 11/19/21 0729     11/19/21 1200  fluconazole (DIFLUCAN) IVPB 200 mg  Status:  Discontinued        200 mg 100 mL/hr over 60 Minutes Intravenous Every 24 hours 11/18/21 0852 11/19/21 0729   11/19/21 0900  anidulafungin (ERAXIS) 200 mg in sodium chloride 0.9 % 200 mL IVPB        200 mg 78 mL/hr over 200 Minutes Intravenous  Once 11/19/21 0729     11/18/21 0945  fluconazole (DIFLUCAN) IVPB 800 mg        800 mg 200 mL/hr over 120 Minutes Intravenous  Once 11/18/21 0852 11/18/21 1204   11/17/21 1600  piperacillin-tazobactam (ZOSYN) IVPB 3.375 g        3.375 g 12.5 mL/hr over 240 Minutes Intravenous Every 8 hours 11/17/21 1333     11/12/21 0800  Ampicillin-Sulbactam (UNASYN) 3 g in sodium chloride 0.9 % 100 mL IVPB  Status:  Discontinued        3 g 200 mL/hr over 30 Minutes Intravenous Every 6 hours 11/12/21 0712 11/17/21 1333   11/10/21 1400  cefTRIAXone (ROCEPHIN) 2 g in sodium chloride 0.9 % 100 mL IVPB  Status:  Discontinued        2 g 200 mL/hr over 30 Minutes Intravenous Daily 11/10/21 1048 11/12/21 0712   11/10/21 0800  ceFEPIme (MAXIPIME) 2 g in sodium chloride 0.9 % 100 mL IVPB  Status:  Discontinued        2 g 200 mL/hr over 30 Minutes Intravenous Every 12 hours 11/10/21  0618 11/10/21 1048   11/10/21 0630  ceFEPIme (MAXIPIME) 2 g in sodium chloride 0.9 % 100 mL IVPB  Status:  Discontinued        2 g 200 mL/hr over 30 Minutes Intravenous Every 24 hours 11/19/2021 1622 11/10/21 0618   11/07/2021 1624  vancomycin variable dose per unstable renal function (pharmacist dosing)  Status:  Discontinued  Does not apply See admin instructions 11/21/2021 1624 11/10/21 0613   11/20/2021 1030  ceFEPIme (MAXIPIME) 2 g in sodium chloride 0.9 % 100 mL IVPB        2 g 200 mL/hr over 30 Minutes Intravenous  Once 10/31/2021 1016 11/08/2021 1133   11/19/2021 1030  metroNIDAZOLE (FLAGYL) IVPB 500 mg        500 mg 100 mL/hr over 60 Minutes Intravenous  Once 11/24/2021 1016 11/08/2021 1915   11/08/2021 1030  vancomycin (VANCOCIN) IVPB 1000 mg/200 mL premix  Status:  Discontinued        1,000 mg 200 mL/hr over 60 Minutes Intravenous  Once 11/13/2021 1016 11/22/2021 1023   11/21/2021 1030  vancomycin (VANCOREADY) IVPB 1500 mg/300 mL        1,500 mg 150 mL/hr over 120 Minutes Intravenous  Once 11/08/2021 1023 11/02/2021 1341       Assessment/Plan:  1 - Bladder Cancer - good prognosis. No further cancer-directed therapy.    2 - Urinary Diversion - working appropriately. Stents in good position   3 - Sepsis, Pyelonephritis- patient is improving on antibiotic therapy and does not require nephrostomy placement at this time  Sincelry appreciate medical team and gen surg comanagement with this complex patient.    Nicolette Bang 11/19/2021

## 2021-11-19 NOTE — Progress Notes (Signed)
Pharmacy Antibiotic Note  William [name] is a 80 y.o. male admitted on 11/17/2021 with sepsis.  Pharmacy was originally consulted for vancomycin and cefepime dosing > Ceftriaxone with Kleb pneumo BCID > Unasyn with addition of E faecalis to urine culture. Abx change from Unasyn to Zosyn. Now with latest BCx showing candida albicans. ID consulted and is working up.  Plan: Stop fluconazole Give anidulafungin 200mg  IV x 1, then start 100mg  IV daily per ID Continue Zosyn 3.375gm q8, 4 hr infusion  Height: 5\' 7"  (170.2 cm) Weight: 86.8 kg (191 lb 5.8 oz) IBW/kg (Calculated) : 66.1  Temp (24hrs), Avg:98.4 F (36.9 C), Min:98 F (36.7 C), Max:99.2 F (37.3 C)  Recent Labs  Lab 11/16/21 0527 11/16/21 1201 11/17/21 0521 11/17/21 1020 11/18/21 0158 11/18/21 0229 11/18/21 0850 11/18/21 1928 11/19/21 0358  WBC 19.7* 20.7* 24.4*  --   --   --  19.6* 18.3* 13.4*  CREATININE 1.07 1.00 1.74*  --  2.56*  --   --   --  2.58*  LATICACIDVEN  --  1.0  --  2.3*  --  1.0  --   --   --      Estimated Creatinine Clearance: 24.4 mL/min (A) (by C-G formula based on SCr of 2.58 mg/dL (H)).    Allergies  Allergen Reactions   Hydrocodone Other (See Comments)    Causes constipation even with stool softner    Antimicrobials this admission: 11/10 Vanc/Flagyl x1 11/10 Cefepime >> 11/11 11/11 CTX >> 11/12 11/13 Unasyn >> 11/18 11/18 Zosy >> 11/19 Fluconazole >> 11/19 11/20 Eraxis >>  Dose adjustments this admission:  Microbiology results: 11/10 BCx: 3/3 bottles Kleb oxytoca, res to Amp 11/10 UCx: Kleb oxytoca (res to Amp) + E faecalis (pan-sens x3) 11/18 rpt BCx: yeast (candida albicans)  Thank you for allowing pharmacy to be a part of this patient's care.  Elenor Quinones, PharmD, BCPS, BCIDP Clinical Pharmacist 11/19/2021 7:32 AM

## 2021-11-20 DIAGNOSIS — R6521 Severe sepsis with septic shock: Secondary | ICD-10-CM | POA: Diagnosis not present

## 2021-11-20 DIAGNOSIS — A419 Sepsis, unspecified organism: Secondary | ICD-10-CM | POA: Diagnosis not present

## 2021-11-20 LAB — URINE CULTURE: Culture: <10000 — AB

## 2021-11-20 LAB — CBC
HCT: 25.1 % — ABNORMAL LOW (ref 39.0–52.0)
Hemoglobin: 8 g/dL — ABNORMAL LOW (ref 13.0–17.0)
MCH: 29.1 pg (ref 26.0–34.0)
MCHC: 31.9 g/dL (ref 30.0–36.0)
MCV: 91.3 fL (ref 80.0–100.0)
Platelets: 315 10*3/uL (ref 150–400)
RBC: 2.75 MIL/uL — ABNORMAL LOW (ref 4.22–5.81)
RDW: 15.3 % (ref 11.5–15.5)
WBC: 8.1 10*3/uL (ref 4.0–10.5)
nRBC: 0 % (ref 0.0–0.2)

## 2021-11-20 LAB — COMPREHENSIVE METABOLIC PANEL
ALT: 16 U/L (ref 0–44)
AST: 26 U/L (ref 15–41)
Albumin: 1.6 g/dL — ABNORMAL LOW (ref 3.5–5.0)
Alkaline Phosphatase: 78 U/L (ref 38–126)
Anion gap: 10 (ref 5–15)
BUN: 24 mg/dL — ABNORMAL HIGH (ref 8–23)
CO2: 21 mmol/L — ABNORMAL LOW (ref 22–32)
Calcium: 7.3 mg/dL — ABNORMAL LOW (ref 8.9–10.3)
Chloride: 104 mmol/L (ref 98–111)
Creatinine, Ser: 1.67 mg/dL — ABNORMAL HIGH (ref 0.61–1.24)
GFR, Estimated: 41 mL/min — ABNORMAL LOW (ref >60)
Glucose, Bld: 85 mg/dL (ref 70–99)
Potassium: 3 mmol/L — ABNORMAL LOW (ref 3.5–5.1)
Sodium: 135 mmol/L (ref 135–145)
Total Bilirubin: 0.7 mg/dL (ref 0.3–1.2)
Total Protein: 5.7 g/dL — ABNORMAL LOW (ref 6.5–8.1)

## 2021-11-20 LAB — GLUCOSE, CAPILLARY
Glucose-Capillary: 103 mg/dL — ABNORMAL HIGH (ref 70–99)
Glucose-Capillary: 107 mg/dL — ABNORMAL HIGH (ref 70–99)
Glucose-Capillary: 109 mg/dL — ABNORMAL HIGH (ref 70–99)
Glucose-Capillary: 82 mg/dL (ref 70–99)
Glucose-Capillary: 84 mg/dL (ref 70–99)
Glucose-Capillary: 85 mg/dL (ref 70–99)
Glucose-Capillary: 97 mg/dL (ref 70–99)

## 2021-11-20 LAB — TSH: TSH: 1.262 u[IU]/mL (ref 0.350–4.500)

## 2021-11-20 LAB — TRIGLYCERIDES: Triglycerides: 131 mg/dL (ref <150)

## 2021-11-20 LAB — MAGNESIUM: Magnesium: 1.8 mg/dL (ref 1.7–2.4)

## 2021-11-20 LAB — PHOSPHORUS: Phosphorus: 2.9 mg/dL (ref 2.5–4.6)

## 2021-11-20 MED ORDER — QUETIAPINE 12.5 MG HALF TABLET
12.5000 mg | ORAL_TABLET | Freq: Every day | ORAL | Status: DC
  Administered 2021-11-20 – 2021-11-23 (×3): 12.5 mg via ORAL
  Filled 2021-11-20 (×5): qty 1

## 2021-11-20 MED ORDER — POTASSIUM CHLORIDE CRYS ER 20 MEQ PO TBCR
40.0000 meq | EXTENDED_RELEASE_TABLET | Freq: Two times a day (BID) | ORAL | Status: DC
  Administered 2021-11-20: 40 meq via ORAL
  Filled 2021-11-20 (×3): qty 2

## 2021-11-20 MED ORDER — TRAZODONE HCL 50 MG PO TABS
50.0000 mg | ORAL_TABLET | Freq: Every day | ORAL | Status: DC
  Administered 2021-11-20 – 2021-11-21 (×2): 50 mg via ORAL
  Filled 2021-11-20 (×4): qty 1

## 2021-11-20 NOTE — Progress Notes (Signed)
[facility] Progress Note  Subjective: 3750 UOP, negative 2300 -  crt down to 1.67  Vitals:   11/20/21 0800 11/20/21 0856 11/20/21 0900 11/20/21 1000  BP: 107/70  96/82 101/65  Pulse: (!) 111 (!) 111 (!) 109 (!) 108  Resp: (!) 21 19 (!) 24 17  Temp: 98.2 F (36.8 C)     TempSrc: Oral     SpO2: 99% 98% 98% 98%  Weight:      Height:        Exam: Gen pretty somnolent looking to me this AM No jvd or bruits Chest clear bilat to bases, no rales/ wheezing RRR no MRG Abd soft ntnd no mass or ascites +bs Ext diffuse 2+ bilat UE and LE edema Neuro is lethargic, nonfocal and responds appropriately, O x 3   Home meds include - advil, neurontin, percocet prn, zanaflex, spiriva, flonase, prns     Baseline creat from this admission is 0.82- 1.07, eGFR > 60 ml/min.     UA 11/10 - cloudy amber, large H/ LE, 100 prot, many bact, >50 rbc/ wbc   UNa 43, UCr 48     CT kidneys > Adrenals/Urinary Tract: Adrenal glands are unremarkable. Status post cystoprostatectomy and right lower quadrant ileal conduit urinary diversion. Mild bilateral hydronephrosis. Bilateral double-J ureteral stents in position. The left renal pelvic pigtail is incompletely formed (series 2, image 76).    CXR  11/18 - Mild right lower lobe airspace disease unchanged. Improvement in left lower lobe airspace disease and small left effusion. Negative for pulmonary edema    Assessment/ Plan: AKI - b/l creat 0.82- 1.07, eGFR > 60 ml/min from this admission. SP cystectomy/ ileal conduit surgery on 10/26/21. Then admitted here 11/10 w/ urosepsis d/t Klebsiella. Creat was 3 on admit 11/10, improved to 0.8 and then increased up to 1.7 on 11/18. UA from 11/10 and 11/18 c/w infection. Urine lytes c/w ATN or euvolemia. Imaging showed chronic bilat hydro and bilat ureteral stents, cystoprostatectomy and ileal conduit. Nonoliguric AKI likely due to sepsis/ abscess.   Abdominal abscess drained per IR 11/19. Creat improved quite a  bit today and UOP very good. No indication for RRT at this time. Cont rx for sepsis/ infection. Will follow.  Klebsiella/ candida bacteremia - getting IV zosyn and eraxis now.  Vol overload - feel like he will autodiurese as his kidney function improves-  but could use lasix if felt needed  SP recent cystectomy w/ ileal conduit - on 10/27, for bladder cancer Nutrition - getting TPN Hypokalemia - getting replaced-  40 BID ordered for 2 days  Pt appears to be improving from a renal standpoint, anticipate renal function will improve to his normal and he will not need renal specific follow up -  I will sign off and follow labs at a distance-  call with any questions      Louis Meckel  11/20/2021, 11:22 AM   Recent Labs  Lab 11/18/21 1928 11/19/21 0358 11/20/21 0540  K  --  3.5 3.0*  BUN  --  33* 24*  CREATININE  --  2.58* 1.67*  CALCIUM  --  7.0* 7.3*  PHOS 4.5 4.7* 2.9  HGB 8.0* 7.5*  --    Inpatient medications:  chlorhexidine  15 mL Mouth Rinse BID   Chlorhexidine Gluconate Cloth  6 each Topical Daily   feeding supplement  237 mL Oral TID BM   fluticasone  2 spray Each Nare Daily   heparin  5,000 Units  Subcutaneous Q8H   insulin aspart  0-9 Units Subcutaneous Q6H   lip balm  1 application Topical BID   mouth rinse  15 mL Mouth Rinse q12n4p   nystatin  5 mL Oral QID   nystatin   Topical BID   potassium chloride  40 mEq Oral BID   sodium chloride flush  10-40 mL Intracatheter Q12H   sodium chloride flush  10-40 mL Intracatheter Q12H   sodium chloride flush  5 mL Intracatheter Q8H   umeclidinium bromide  1 puff Inhalation Daily    sodium chloride 75 mL/hr at 11/14/21 2328   anidulafungin 100 mg (11/20/21 1048)   chlorproMAZINE (THORAZINE) IV Stopped (11/19/21 1550)   methocarbamol (ROBAXIN) IV Stopped (11/19/21 1418)   ondansetron (ZOFRAN) IV     piperacillin-tazobactam (ZOSYN)  IV 12.5 mL/hr at 11/20/21 1000   acetaminophen, chlorproMAZINE (THORAZINE) IV,  diphenhydrAMINE, HYDROmorphone (DILAUDID) injection, magic mouthwash, menthol-cetylpyridinium, methocarbamol (ROBAXIN) IV, ondansetron (ZOFRAN) IV **OR** ondansetron (ZOFRAN) IV, phenol, sodium chloride flush, sodium chloride flush

## 2021-11-20 NOTE — Plan of Care (Signed)
  Problem: Pain Managment: Goal: General experience of comfort will improve Outcome: Not Progressing  Requests frequent pain medications. Non opioid interventions not effective to control pain

## 2021-11-20 NOTE — Progress Notes (Signed)
[facility]    Date of Admission:  11/08/2021   Total days of antibiotics 11          ID: SAMIL MECHAM is a 80 y.o. male with hospital onset fungemia 2/2 intra-abdominal abscess Principal Problem:   Septic shock ([place]) Active Problems:   Chronic pain   Bladder cancer ([place])   Bacteremia due to Gram-negative bacteria   UTI due to Klebsiella species    Subjective: Afebrile, denies subjective chills but feels weak. Denies pain to lip lesion  Medications:   chlorhexidine  15 mL Mouth Rinse BID   Chlorhexidine Gluconate Cloth  6 each Topical Daily   feeding supplement  237 mL Oral TID BM   fluticasone  2 spray Each Nare Daily   heparin  5,000 Units Subcutaneous Q8H   insulin aspart  0-9 Units Subcutaneous Q6H   lip balm  1 application Topical BID   mouth rinse  15 mL Mouth Rinse q12n4p   nystatin  5 mL Oral QID   nystatin   Topical BID   potassium chloride  40 mEq Oral BID   QUEtiapine  12.5 mg Oral QHS   sodium chloride flush  10-40 mL Intracatheter Q12H   sodium chloride flush  10-40 mL Intracatheter Q12H   sodium chloride flush  5 mL Intracatheter Q8H   traZODone  50 mg Oral QHS   umeclidinium bromide  1 puff Inhalation Daily    Objective: Vital signs in last 24 hours: Temp:  [98 F (36.7 C)-101 F (38.3 C)] 98.7 F (37.1 C) (11/21 1200) Pulse Rate:  [90-138] 117 (11/21 1500) Resp:  [14-32] 20 (11/21 1500) BP: (96-129)/(44-86) 119/66 (11/21 1500) SpO2:  [83 %-99 %] 95 % (11/21 1550) Weight:  [90.6 kg] 90.6 kg (11/21 0500)  Physical Exam  Constitutional: He is oriented to person, place, and time. He appears well-developed and well-nourished. No distress.  HENT:  Mouth/Throat: Oropharynx is clear and moist. No oropharyngeal exudate.  Cardiovascular: Normal rate, regular rhythm and normal heart sounds. Exam reveals no gallop and no friction rub.  No murmur heard.  Pulmonary/Chest: Effort normal and breath sounds normal. No respiratory  distress. He has no wheezes.  Abdominal: Soft. Bowel sounds are normal. He exhibits no distension. There is no tenderness.  Lymphadenopathy:  He has no cervical adenopathy.  Neurological: He is alert and oriented to person, place, and time.  Skin: Skin is warm and dry. No rash noted. No erythema. Picc line is c/d/I on right arm Ext+anasarca to arms and legs Psychiatric: He has a normal mood and affect. His behavior is normal.    Lab Results Recent Labs    11/19/21 0358 11/20/21 0540 11/20/21 0800  WBC 13.4*  --  8.1  HGB 7.5*  --  8.0*  HCT 23.7*  --  25.1*  NA 136 135  --   K 3.5 3.0*  --   CL 105 104  --   CO2 23 21*  --   BUN 33* 24*  --   CREATININE 2.58* 1.67*  --    Liver Panel Recent Labs    11/19/21 0358 11/20/21 0540  PROT 4.8* 5.7*  ALBUMIN 1.7* 1.6*  AST 26 26  ALT 15 16  ALKPHOS 74 78  BILITOT 0.7 0.7   Sedimentation Rate No results for input(s): ESRSEDRATE in the last 72 hours. C-Reactive Protein No results for input(s): CRP in the last 72 hours.  Microbiology:  Studies/Results: No results found.  Assessment/Plan: C.albicans fungemia = in the setting of also having been found intra-abdominal abscess. Cx pending. Also has had picc line placed at the time of his picc line placement. Has blood cx pending from 11/19. Thus far no growth. Continue on anidulafungin. He will need optho exam to rule out candidal retinitis. If rule out blood cx are negative, would like to exchange picc line. If still positive, then will have picc line removed with line holiday. Patient has significant anasarca with would be difficulty for line holiday.  Intra-abdominal abscess = also continues on piptazo. Awaiting cx resultus  Kleb oxytoca bacteremia = also would be covered by piptazo.  [facility] Infect[facility] Diseases Pager: [phone]  11/20/2021, 3:54 PM

## 2021-11-20 NOTE — Progress Notes (Signed)
Subjective/Chief Complaint:  1 - Bladder Cancer - s/p cystectomy with lysis of adhesions and small bowel resection 10/25/2021 for stage 2 bladder cancer with negative nodes and margins, Had neoadjuvant chemo prior.    2 - Urinary Diversion - s/p conduit diversion at cystectomy 09/2021. Stents in good position by ER CT this admission.    3 - Sepsis, Pyelonephritis, Abdominal Abscess, R/O CDiff- tachycardia, chills, lactic acidosis c/w major sepsis at ER eval 11/10. CT with expected subcutaneous gas (from insuflattion at procedure), some pelvic fluid. NO significant inraperitoneal air, stents in good position. Some dilated small bowel. UCX and BCX c/w coliform pyelo and placed on therapy with improvement. Klebsiella and Enterococcus.  Repeat CT 11/18 with pelvic fluic collection, no bowel obstruction, has been having freqeunt loose stools and worsening leukocytosis as well as abdominal pain. CDiff negative 11/18. Abscess drain placed 11/19 and non-foul fluid obtained, CX NGTD, Cr pending.    Today "William [name]" is seen  in f/u above. Infectious parameters improving again with abdominal drain. Fluid non-foul and no growth to date, recent CDiff negative.     Objective: Vital signs in last 24 hours: Temp:  [98 F (36.7 C)-101 F (38.3 C)] 98.7 F (37.1 C) (11/21 1200) Pulse Rate:  [90-138] 120 (11/21 1600) Resp:  [14-32] 21 (11/21 1600) BP: (96-129)/(44-86) 101/52 (11/21 1600) SpO2:  [83 %-99 %] 95 % (11/21 1600) Weight:  [90.6 kg] 90.6 kg (11/21 0500) Last BM Date: 11/20/21  Intake/Output from previous day: 11/20 0701 - 11/21 0700 In: 2466.4 [P.O.:1780; I.V.:5; IV Piggyback:651.4] Out: 4805 [Urine:3750; Drains:580; Stool:475] Intake/Output this shift: Total I/O In: 106.7 [P.O.:50; I.V.:25; Other:5; IV Piggyback:26.7] Out: 825 [Urine:700; Drains:125]  Ill appearing. Decreased WOB compared to prior. Somewhat improved anasarca. [place] O2 Regular tachycardia 110's by monitor. Non-distended  abdomen. NO rebound / guarding RLQ urostomy pink / patent with distal bilateral stents in place and non-foul urine LLQ abdominal drain with non-foul serous output that is straw colored.  Edematous scrotum and legs c/w overall edema  Lab Results:  Recent Labs    11/19/21 0358 11/20/21 0800  WBC 13.4* 8.1  HGB 7.5* 8.0*  HCT 23.7* 25.1*  PLT 317 315   BMET Recent Labs    11/19/21 0358 11/20/21 0540  NA 136 135  K 3.5 3.0*  CL 105 104  CO2 23 21*  GLUCOSE 73 85  BUN 33* 24*  CREATININE 2.58* 1.67*  CALCIUM 7.0* 7.3*   PT/INR Recent Labs    11/18/21 1928  LABPROT 16.2*  INR 1.3*   ABG No results for input(s): PHART, HCO3 in the last 72 hours.  Invalid input(s): PCO2, PO2  Studies/Results: No results found.  Anti-infectives: Anti-infectives (From admission, onward)    Start     Dose/Rate Route Frequency Ordered Stop   11/20/21 1000  anidulafungin (ERAXIS) 100 mg in sodium chloride 0.9 % 100 mL IVPB        100 mg 78 mL/hr over 100 Minutes Intravenous Every 24 hours 11/19/21 0729     11/19/21 1200  fluconazole (DIFLUCAN) IVPB 200 mg  Status:  Discontinued        200 mg 100 mL/hr over 60 Minutes Intravenous Every 24 hours 11/18/21 0852 11/19/21 0729   11/19/21 0900  anidulafungin (ERAXIS) 200 mg in sodium chloride 0.9 % 200 mL IVPB        200 mg 78 mL/hr over 200 Minutes Intravenous  Once 11/19/21 0729 11/19/21 1220   11/18/21 0945  fluconazole (DIFLUCAN) IVPB 800  mg        800 mg 200 mL/hr over 120 Minutes Intravenous  Once 11/18/21 0852 11/18/21 1204   11/17/21 1600  piperacillin-tazobactam (ZOSYN) IVPB 3.375 g        3.375 g 12.5 mL/hr over 240 Minutes Intravenous Every 8 hours 11/17/21 1333     11/12/21 0800  Ampicillin-Sulbactam (UNASYN) 3 g in sodium chloride 0.9 % 100 mL IVPB  Status:  Discontinued        3 g 200 mL/hr over 30 Minutes Intravenous Every 6 hours 11/12/21 0712 11/17/21 1333   11/10/21 1400  cefTRIAXone (ROCEPHIN) 2 g in sodium chloride  0.9 % 100 mL IVPB  Status:  Discontinued        2 g 200 mL/hr over 30 Minutes Intravenous Daily 11/10/21 1048 11/12/21 0712   11/10/21 0800  ceFEPIme (MAXIPIME) 2 g in sodium chloride 0.9 % 100 mL IVPB  Status:  Discontinued        2 g 200 mL/hr over 30 Minutes Intravenous Every 12 hours 11/10/21 0618 11/10/21 1048   11/10/21 0630  ceFEPIme (MAXIPIME) 2 g in sodium chloride 0.9 % 100 mL IVPB  Status:  Discontinued        2 g 200 mL/hr over 30 Minutes Intravenous Every 24 hours 11/14/2021 1622 11/10/21 0618   11/05/2021 1624  vancomycin variable dose per unstable renal function (pharmacist dosing)  Status:  Discontinued         Does not apply See admin instructions 11/21/2021 1624 11/10/21 0613   11/11/2021 1030  ceFEPIme (MAXIPIME) 2 g in sodium chloride 0.9 % 100 mL IVPB        2 g 200 mL/hr over 30 Minutes Intravenous  Once 11/07/2021 1016 11/13/2021 1133   11/28/2021 1030  metroNIDAZOLE (FLAGYL) IVPB 500 mg        500 mg 100 mL/hr over 60 Minutes Intravenous  Once 11/22/2021 1016 11/26/2021 1915   11/20/2021 1030  vancomycin (VANCOCIN) IVPB 1000 mg/200 mL premix  Status:  Discontinued        1,000 mg 200 mL/hr over 60 Minutes Intravenous  Once 11/05/2021 1016 11/20/2021 1023   11/25/2021 1030  vancomycin (VANCOREADY) IVPB 1500 mg/300 mL        1,500 mg 150 mL/hr over 120 Minutes Intravenous  Once 11/20/2021 1023 11/08/2021 1341       Assessment/Plan:  1 - Bladder Cancer - good prognosis. No further cancer-directed therapy.    2 - Urinary Diversion - working appropriately. Stents in good position   3 - Sepsis, Pyelonephritis, Abdominal Abscess R/O CDiff- Improving s/p abd drain suggests this was second primary infectious site in addition to pyelo. Thankful no signs of bowel leak. Will check Drain Cr to max r/o urine leak.  Continue to greatly appreciate medical team, IR, nephrol and gen surg comanagement with this complex patient.    [name] 11/20/2021

## 2021-11-20 NOTE — Progress Notes (Addendum)
PROGRESS NOTE    William [name]  EUM:[id number] DOB: August 05, 1941 DOA: 11/14/2021 PCP: [name], MD    Brief Narrative:  William [name] is a 80 year old male with past medical history significant for bladder cancer s/p robotic assisted laparoscopic radical cystectomy/prostatectomy on 10/25/21 by [name], bilateral pelvic lymph node dissection, extensive laparoscopic adhesiolysis, small bowel resection with ileal conduit, urinary diversion 10/26/21 by [name] (discharged by urology on post op day#8 11/02/21), COPD, history of tobacco use disorder who presented to [facility] ED on 11/13/2021 from urology clinic with finding of low blood pressure with SBP in the 50's.  Work-up revealed septic shock secondary to Klebsiella oxytoca/Enterococcus faecalis UTI and Klebsiella oxytoca bacteremia, POA.  Was placed on vasopressors and admitted to the ICU.  Care transferred to [facility], Hospitalist service, on 11/11/2021.  Hospital course complicated by ileus/partial small bowel obstruction for which he was seen by general surgery.  He has regained bowel function, general surgery signed off on 11/19/2021.  Anasarca and worsening renal failure for which he was seen by nephrology.  His renal function improved with good urine output, nephrology signed off on 11/20/2021.  He developed a large intra-abdominal abscess measuring 25.5 cm seen on CT scan taken on 11/17/2021.  Due to worsening leukocytosis and tachycardia, blood cultures were obtained, 2 out of 2 bottles returned positive for Candida albicans.  Started on IV Diflucan.  Infectious disease was consulted, IV Diflucan was switched to Eraxis due to concern for intra-abdominal abscess as a source of the fungemia.  Seen by ophthalmology, [name] Close, eye exam was benign with no evidence of fungal endophthalmitis.  The intra-abdominal abscess was drained by interventional radiology [name] on 11/18/2021, currently on Zosyn.  Loose stools, C. difficile negative on  11/17/2021.  11/20/2021: Seen and examined with his wife at his bedside.  Some delirium overnight.  He is more alert and interactive this morning.  States his legs feel heavy, volume overload on exam with 3+ pitting edema in lower extremities bilaterally.  Assessment & Plan:   Principal Problem:   Septic shock ([place]) Active Problems:   Chronic pain   Bladder cancer ([place])   Bacteremia due to Gram-negative bacteria   UTI due to Klebsiella species  Septic shock, sepsis criteria is improving, no longer on vasopressor, secondary to Klebsiella oxytoca/Enterococcus faecalis UTI/Klebsiella oxytoca bacteremia, newly diagnosed Candida albicans fungemia Patient presenting to ED from urology clinic after being found with systolic blood pressures in the 50s.  Received Levophed vasopressor, and was admitted to the ICU.  Then transferred to [facility] on 11/11/2021.  Source of infection thought to be from urinary tract.   Patient was started on empiric antibiotics initially with IV vancomycin, cefepime, IV metronidazole and was de-escalated to ceftriaxone based on cultures, then later de-escalated to Unasyn.  Off vasopressors and maintaining MAP greater than 65.  Unasyn was switched to Zosyn on 11/17/2021 due findings of intra-abdominal abscess. Blood cultures obtained on 11/17/2021 positive for Candida albicans.  Seen by ophthalmology, eye exam was benign.  Infectious disease following.   Procalcitonin down trended 0.74, peaked at 41.64 on 11/11/2021. --Blood cultures x2: Klebsiella oxytoca --Urine culture > 100,000 colonies of Klebsiella oxytoca and 100,000 colonies of Enterococcus faecalis. Leukocytosis 13,000 from 24,000.   Repeat CBC, monitor fever curve and WBC.  Intra-abdominal abscess seen on CT scan measuring 25.5 cm status post image guided drain placement left lower quadrant by interventional radiology [name] on 11/18/2021. Switched Unasyn to Zosyn 11/17/21 Eraxis started on  11/18/2021. Continue  to closely monitor WBC and fever curve.  Newly diagnosed Candida albicans fungemia From blood cultures drawn on 11/17/2021, 2 out of 2 bottles. IV Diflucan started on 11/18/2021, switched to Eraxis due to intra-abdominal abscess suspected as a source of the fungemia. Infectious disease consulted Benign eye exam, completed by ophthalmology [name] Close on 11/19/2021.  No evidence of fungal endophthalmitis.  Small bowel obstruction, now ileus/partial small bowel obstruction. CT abdomen/pelvis on admission with concern for small bowel obstruction with possible transition point in the lower abdomen/pelvis near surgical anastomosis site. NG tube removed on 11/14/2021. --General surgery/urology following, appreciate assistance Is currently on full liquid diet, advance diet as tolerated.  TPN on hold since 11/18/21 due to fungemia. Continue to optimize magnesium and potassium levels. Continue to mobilize as tolerated. Goal potassium greater than 4.0 Goal magnesium greater than 2.0. Continue to mobilize as tolerated  Delirium secondary to acute illness Apply nonpharmacological and pharmacological delirium precautions and management. Regular sleep/awake cycle, open blinds during the day, out of bed to chair during the day, mobilize as tolerated with assistance during the day, and at night antipsychotic and sleep aid with trazodone. Start Seroquel 12.5 mg nightly and trazodone nightly Monitor QTC  Anasarca suspect secondary to hypoalbuminemia, severe anemia, acute CHF Has received IV albumin 12.5 mg every 6 hours x2 doses. Management per nephrology.  Acute hypoxic respiratory failure secondary to bilateral pleural effusions Not on oxygen supplementation at baseline Currently on 4 L O2 saturation of 98 to 99% Wean off oxygen supplementation as tolerated Incentive spirometer/flutter valve.  Bilateral pleural effusion secondary to volume overload Has received IV Lasix Continue incentive  spirometer and flutter valve Continue to mobilize as tolerated Appreciate nephrology's assistance. CT chest ordered, personally reviewed showing bilateral pleural effusions with bilateral atelectasis.  Sinus tachycardia suspect related to sepsis and ongoing lung physiology HR 120-130's Continue to treat underlying conditions PRN Lopressor 2.5 mg q6H if HR is consistently>130  Nonoliguric AKI suspect secondary to intravascular volume depletion, improving. Etiology likely secondary to prolonged prerenal state from  --Cr 2.87>3.00>1.67>1.21>1.04>0.90>0.86> 1.74> 2.56> 1.6. Off IV fluid Has received IV albumin 12.5 mg every 6 hours x2 doses. TPN started on 11/16/2021, held on 11/18/2021 due to candidemia.  Improving severe left flank/back pain secondary to intra-abdominal large abscess seen on CT scan. At baseline on gabapentin, oxycodone, Zanaflex. No acute findings on CT lumbar spine. Continue pain control with IV narcotics as needed for severe pain  Acute on chronic diastolic CHF, likely exacerbated by volume overload BNP 323 Bilateral pleural effusions Mild pulmonary edema Continue strict I's and O's and daily weight  Oral thrush Continue IV Eraxis Continue oral care  Refractory hypokalemia Potassium 3.0 from 3.6 Repleted orally Continue to replete electrolytes as indicated.  Bladder cancer s/p radical cystectomy, prostatectomy and ileal conduit by urology [name] on 10/25/2021, and 10/26/2021. Underwent robotic assisted laparoscopic radical cystectomy, robotic radical prostatectomy, bilateral pelvic lymph node dissection, extensive laparoscopic adhesiolysis, small bowel resection and ileal conduit urinary diversion on 10/25/2021 and on 10/26/2021 by [name]. --Urology following, appreciate assistance Continue ostomy care;  Continue to monitor urinary output  COPD History of tobacco abuse --Incruse Ellipta 1 puff daily --DuoNebs as needed Continue to maintain O2  saturation greater than 90%.  Normocytic anemia  Chemotherapy 8 months ago 9.2>9.5>9.2>8.8>9.4>9.2>8.9> 8.3> 9.6> 7.5. --Transfuse for hemoglobin less than 7.0 or active bleeding Hemoglobin is downtrending continue to closely monitor H&H. Will obtain iron studies, follow results, IV Feraheme if low.  Suspected  ILD Noted on CT imaging this admission.  History of tobacco abuse. --Flonase --Outpatient pulmonology follow-up  Weakness/deconditioning/debility: --PT/OT recommending home health on discharge. Continue to mobilize as tolerated Continue fall precautions.  Severe protein calorie malnutrition. Albumin 1.7 on 11/17/2021 Moderate muscle mass loss. Encourage oral intake as tolerated when not NPO. Advance diet as tolerated Oral supplement when can tolerate p.o.   Critical care time: 65 minutes.   DVT prophylaxis: heparin injection 5,000 Units Start: 11/13/2021 1515 SCDs Start: 11/04/2021 1502   Code Status: Full Code Family Communication: Updated his wife at bedside on 11/20/2021.  Also updated his son via phone.  Disposition Plan:  Level of care: Stepdown, Transfer to stepdown unit on 11/17/21. Status is: Inpatient  Remains inpatient appropriate because: Continues on IV antibiotics, NG tube removed today, general surgery advancing diet, needs to demonstrate toleration of diet before able discharge home.   Consultants:  PCCM - signed off 11/12 General surgery Urology  Procedures:  Central line placement 11/10  Antimicrobials:  Ceftriaxone 11/11 - 11/13 Vancomycin 11/10 - 11/11 Cefepime 11/10 - 11/11 Metronidazole 11/10 - 11/10 Unasyn 11/13>>     Objective: Vitals:   11/20/21 0856 11/20/21 0900 11/20/21 1000 11/20/21 1200  BP:  96/82 101/65   Pulse: (!) 111 (!) 109 (!) 108   Resp: 19 (!) 24 17   Temp:    98.7 F (37.1 C)  TempSrc:    Oral  SpO2: 98% 98% 98%   Weight:      Height:        Intake/Output Summary (Last 24 hours) at 11/20/2021 1252 Last  data filed at 11/20/2021 1000 Gross per 24 hour  Intake 753.14 ml  Output 3605 ml  Net -2851.86 ml   Filed Weights   11/18/21 0500 11/19/21 0359 11/20/21 0500  Weight: 89.5 kg 86.8 kg 90.6 kg    Examination:  General exam: Well-appearing in no acute distress.  He is alert and oriented x3.   Respiratory system: Mild rales at bases no wheezing noted.  Poor inspiratory effort.   Cardiovascular system: Regular rate and rhythm no rubs or gallops.   Gastrointestinal system: Mildly distended bowel sounds present.  Central nervous system: Alert and awake, moves all 4 extremities extremities: Anasarca present.  Edema lower EXTR bilaterally.  Dependent edema in upper extremities bilaterally. Skin: No rashes or ulcerative lesions noted.   Psychiatry: Mood is appropriate for condition and setting.  Data Reviewed: I have personally reviewed following labs and imaging studies  CBC: Recent Labs  Lab 11/16/21 1201 11/17/21 0521 11/18/21 0850 11/18/21 1928 11/19/21 0358  WBC 20.7* 24.4* 19.6* 18.3* 13.4*  NEUTROABS 17.7*  --   --   --   --   HGB 8.5* 9.6* 6.8* 8.0* 7.5*  HCT 26.9* 30.8* 21.7* 25.4* 23.7*  MCV 93.1 93.6 93.1 92.4 93.7  PLT 314 305 282 287 989   Basic Metabolic Panel: Recent Labs  Lab 11/15/21 1508 11/16/21 0527 11/16/21 1201 11/17/21 0521 11/18/21 0158 11/18/21 1928 11/19/21 0358 11/20/21 0540  NA 137 138 136 135 135  --  136 135  K 3.5 3.2* 3.1* 3.6 3.6  --  3.5 3.0*  CL 103 106 104 104 104  --  105 104  CO2 25 25 25 24 24   --  23 21*  GLUCOSE 108* 95 93 126* 119*  --  73 85  BUN 17 17 19 22  31*  --  33* 24*  CREATININE 0.83 1.07 1.00 1.74* 2.56*  --  2.58*  1.67*  CALCIUM 7.4* 7.4* 7.2* 7.4* 7.2*  --  7.0* 7.3*  MG 2.0 1.9  --  2.1 2.0  --   --  1.8  PHOS  --  3.3  --  3.0  --  4.5 4.7* 2.9   GFR: Estimated Creatinine Clearance: 38.5 mL/min (A) (by C-G formula based on SCr of 1.67 mg/dL (H)). Liver Function Tests: Recent Labs  Lab 11/16/21 1201  11/17/21 0521 11/18/21 0158 11/19/21 0358 11/20/21 0540  AST 25 34 30 26 26   ALT 16 19 17 15 16   ALKPHOS 86 96 84 74 78  BILITOT 0.9 0.8 0.7 0.7 0.7  PROT 5.2* 5.5* 5.6* 4.8* 5.7*  ALBUMIN 1.7* 1.7* 1.8* 1.7* 1.6*   Recent Labs  Lab 11/17/21 1020  LIPASE 53*   No results for input(s): AMMONIA in the last 168 hours. Coagulation Profile: Recent Labs  Lab 11/18/21 1928  INR 1.3*    Cardiac Enzymes: No results for input(s): CKTOTAL, CKMB, CKMBINDEX, TROPONINI in the last 168 hours. BNP (last 3 results) No results for input(s): PROBNP in the last 8760 hours. HbA1C: No results for input(s): HGBA1C in the last 72 hours. CBG: Recent Labs  Lab 11/19/21 1939 11/20/21 0007 11/20/21 0343 11/20/21 0746 11/20/21 1204  GLUCAP 132* 97 82 85 103*   Lipid Profile: Recent Labs    11/20/21 0540  TRIG 131   Thyroid Function Tests: No results for input(s): TSH, T4TOTAL, FREET4, T3FREE, THYROIDAB in the last 72 hours. Anemia Panel: No results for input(s): VITAMINB12, FOLATE, FERRITIN, TIBC, IRON, RETICCTPCT in the last 72 hours.  Sepsis Labs: Recent Labs  Lab 11/14/21 0313 11/16/21 1201 11/17/21 0521 11/17/21 1020 11/18/21 0229  PROCALCITON 2.62  --  0.74  --   --   LATICACIDVEN  --  1.0  --  2.3* 1.0    Recent Results (from the past 240 hour(s))  Culture, blood (routine x 2)     Status: Abnormal (Preliminary result)   Collection Time: 11/17/21  5:10 AM   Specimen: BLOOD  Result Value Ref Range Status   Specimen Description   Final    BLOOD BLOOD LEFT HAND Performed at [facility], [place] [address] [place], [place] [address]    Special Requests   Final    BOTTLES DRAWN AEROBIC ONLY Blood Culture adequate volume Performed at [place] [address], [place], [place] [address]    Culture  Setup Time   Final    YEAST AEROBIC BOTTLE ONLY CRITICAL VALUE NOTED.  VALUE IS CONSISTENT WITH PREVIOUSLY REPORTED AND CALLED  VALUE. Performed at [facility], [place] [address], [place], [place] [address]    Culture CANDIDA ALBICANS (A)  Final   Report Status PENDING  Incomplete  Culture, blood (routine x 2)     Status: Abnormal (Preliminary result)   Collection Time: 11/17/21  5:20 AM   Specimen: BLOOD  Result Value Ref Range Status   Specimen Description   Final    BLOOD BLOOD LEFT WRIST Performed at [place] [address], [place], [place] [address]    Special Requests   Final    BOTTLES DRAWN AEROBIC ONLY Blood Culture adequate volume Performed at [place] [address] [place],  [address]    Culture  Setup Time   Final    YEAST AEROBIC BOTTLE ONLY CRITICAL RESULT CALLED TO, READ BACK BY AND VERIFIED WITH: A PHAM PHARMD @0821  11/18/21 EB  Culture (A)  Final    CANDIDA ALBICANS Sent to [place] for further susceptibility testing. Performed at [facility], [place] [address], [place], [place] [address]    Report Status PENDING  Incomplete  Blood Culture ID Panel (Reflexed)     Status: Abnormal   Collection Time: 11/17/21  5:20 AM  Result Value Ref Range Status   Enterococcus faecalis NOT DETECTED NOT DETECTED Final   Enterococcus Faecium NOT DETECTED NOT DETECTED Final   Listeria monocytogenes NOT DETECTED NOT DETECTED Final   Staphylococcus species NOT DETECTED NOT DETECTED Final   Staphylococcus aureus (BCID) NOT DETECTED NOT DETECTED Final   Staphylococcus epidermidis NOT DETECTED NOT DETECTED Final   Staphylococcus lugdunensis NOT DETECTED NOT DETECTED Final   Streptococcus species NOT DETECTED NOT DETECTED Final   Streptococcus agalactiae NOT DETECTED NOT DETECTED Final   Streptococcus pneumoniae NOT DETECTED NOT DETECTED Final   Streptococcus pyogenes NOT DETECTED NOT DETECTED Final   A.calcoaceticus-baumannii NOT DETECTED NOT DETECTED Final   Bacteroides fragilis NOT DETECTED NOT DETECTED Final   Enterobacterales NOT  DETECTED NOT DETECTED Final   Enterobacter cloacae complex NOT DETECTED NOT DETECTED Final   Escherichia coli NOT DETECTED NOT DETECTED Final   Klebsiella aerogenes NOT DETECTED NOT DETECTED Final   Klebsiella oxytoca NOT DETECTED NOT DETECTED Final   Klebsiella pneumoniae NOT DETECTED NOT DETECTED Final   Proteus species NOT DETECTED NOT DETECTED Final   Salmonella species NOT DETECTED NOT DETECTED Final   Serratia marcescens NOT DETECTED NOT DETECTED Final   Haemophilus influenzae NOT DETECTED NOT DETECTED Final   Neisseria meningitidis NOT DETECTED NOT DETECTED Final   Pseudomonas aeruginosa NOT DETECTED NOT DETECTED Final   Stenotrophomonas maltophilia NOT DETECTED NOT DETECTED Final   Candida albicans DETECTED (A) NOT DETECTED Final    Comment: CRITICAL RESULT CALLED TO, READ BACK BY AND VERIFIED WITH: A PHAM PHARMD @0821  11/18/21 EB    Candida auris NOT DETECTED NOT DETECTED Final   Candida glabrata NOT DETECTED NOT DETECTED Final   Candida krusei NOT DETECTED NOT DETECTED Final   Candida parapsilosis NOT DETECTED NOT DETECTED Final   Candida tropicalis NOT DETECTED NOT DETECTED Final   Cryptococcus neoformans/gattii NOT DETECTED NOT DETECTED Final    Comment: Performed at [facility], [address], [place], [place] [address]  MRSA Next Gen by PCR, Nasal     Status: None   Collection Time: 11/17/21  6:24 PM   Specimen: Nasal Mucosa; Nasal Swab  Result Value Ref Range Status   MRSA by PCR Next Gen NOT DETECTED NOT DETECTED Final    Comment: (NOTE) The GeneXpert MRSA Assay (FDA approved for NASAL specimens only), is one component of a comprehensive MRSA colonization surveillance program. It is not intended to diagnose MRSA infection nor to guide or monitor treatment for MRSA infections. Test performance is not FDA approved in patients less than 37 years old. Performed at [facility], [place] [address] [place], [place] [address]   C Difficile Quick  Screen (NO PCR Reflex)     Status: None   Collection Time: 11/17/21  8:40 PM   Specimen: STOOL  Result Value Ref Range Status   C Diff antigen NEGATIVE NEGATIVE Final   C Diff toxin NEGATIVE NEGATIVE Final   C Diff interpretation NEGATIVE  Final    Comment: Performed at [facility], [place] [address] [place], [place] [address]  Aerobic/Anaerobic Culture w Gram Stain (surgical/deep wound)  Status: None (Preliminary result)   Collection Time: 11/18/21 11:01 AM   Specimen: Abdomen; Abscess  Result Value Ref Range Status   Specimen Description   Final    ABDOMEN Performed at [place] [address] [place], [place] [address]    Special Requests   Final    ABSCES Performed at [facility], [place] [address] [place], [place] [address]    Gram Stain   Final    MODERATE WBC PRESENT,BOTH PMN AND MONONUCLEAR NO ORGANISMS SEEN    Culture   Final    CULTURE REINCUBATED FOR BETTER GROWTH Performed at [facility], [place] [address] [place], [place] [address]    Report Status PENDING  Incomplete  Culture, fungus without smear     Status: None (Preliminary result)   Collection Time: 11/18/21 11:02 AM   Specimen: Abscess; Other  Result Value Ref Range Status   Specimen Description   Final    ABSCESS Performed at [place] [address], [place], [place] [address]    Special Requests ABDOMEN  Final   Culture   Final    NO FUNGUS ISOLATED AFTER 2 DAYS Performed at [facility], [place] [address], [place], [place] [address]    Report Status PENDING  Incomplete  Urine Culture     Status: Abnormal   Collection Time: 11/18/21  3:03 PM   Specimen: Urine, Clean Catch  Result Value Ref Range Status   Specimen Description   Final    URINE, CLEAN CATCH Performed at [facility], [place] [address] [place], [place] [address]    Special Requests   Final    NONE Performed at [facility], [place] [address] [place], [place] [address]    Culture (A)  Final    <10,000 COLONIES/mL INSIGNIFICANT GROWTH Performed at [place] [address], [place], [place] [address]    Report Status 11/20/2021 FINAL  Final         Radiology Studies: No results found.      Scheduled Meds:  chlorhexidine  15 mL Mouth Rinse BID   Chlorhexidine Gluconate Cloth  6 each Topical Daily   feeding supplement  237 mL Oral TID BM   fluticasone  2 spray Each Nare Daily   heparin  5,000 Units Subcutaneous Q8H   insulin aspart  0-9 Units Subcutaneous Q6H   lip balm  1 application Topical BID   mouth rinse  15 mL Mouth Rinse q12n4p   nystatin  5 mL Oral QID   nystatin   Topical BID   potassium chloride  40 mEq Oral BID   QUEtiapine  12.5 mg Oral QHS   sodium chloride flush  10-40 mL Intracatheter Q12H   sodium chloride flush  10-40 mL Intracatheter Q12H   sodium chloride flush  5 mL Intracatheter Q8H   traZODone  50 mg Oral QHS   umeclidinium bromide  1 puff Inhalation Daily   Continuous Infusions:  sodium chloride 75 mL/hr at 11/14/21 2328   anidulafungin 100 mg (11/20/21 1048)   chlorproMAZINE (THORAZINE) IV Stopped (11/19/21 1550)   methocarbamol (ROBAXIN) IV Stopped (11/19/21 1418)   ondansetron (ZOFRAN) IV     piperacillin-tazobactam (ZOSYN)  IV 12.5 mL/hr at 11/20/21 1000     LOS: 11 days       Kayleen Memos, DO [facility] Available via Epic secure chat 7am-7pm After these hours, please refer to coverage  provider listed on [email or web address] 11/20/2021, 12:52 PM

## 2021-11-20 NOTE — Progress Notes (Signed)
Physical Therapy Treatment Patient Details Name: William [name] MRN: [id number] DOB: 08/11/1941 Today's Date: 11/20/2021   History of Present Illness Patient  is a 80 year old male who presented from urology clinic on 11/10 with low blood pressure. patient was found to have septic shock. PMH: s/p robotic cystoprostatectomy, pelvic node dissection , conduit diversion, extensive adhesioloysis , small bowel resection and cysto / ICG injection on 10/25/21 due to bladder cancer, tinnitus, Covid-19, back surgery    PT Comments    Patient  presents with decline in strength and endurance today. Patient  required mod/max assistnace to sit up and return to supine.   Patient die stand at the [facility] stedy lift equipment x 2 with min asisstance for a few minutes  and worked on small steps in place.  Patient may benefit from post acute rehab as he demonstrates a decline. No family present. Patient's HR  at rest 116, up to  140 while standing. SPO2 >97% on 6 L. RR 21-33. blood pressure 119/66. No C/O dizziness.  Recommendations for follow up therapy are one component of a multi-disciplinary discharge planning process, led by the attending physician.  Recommendations may be updated based on patient status, additional functional criteria and insurance authorization.  Follow Up Recommendations  Home health PT (vs post acute rehab faci;ity)     Assistance Recommended at Discharge Frequent or constant Supervision/Assistance  Equipment Recommendations  None recommended by PT    Recommendations for Other Services       Precautions / Restrictions Precautions Precautions: Fall Precaution Comments: urostomy, left drain, has been getting weaker  flexiseal    Mobility  Bed Mobility   Bed Mobility: Supine to Sit;Rolling     Supine to sit: Max assist;HOB elevated Sit to supine: Max assist;+2 for physical assistance;+2 for safety/equipment   General bed mobility comments: assist with both legs and assist  with trunk, assist with legs and trunk to return to suine and roll    Transfers     Transfers: Sit to/from Stand Sit to Stand: From elevated surface;Min assist           General transfer comment: increased time. Patient able to reach for horizintal bar of [employer] [place] and stand with min assistnace. Stood inside [facility], able to perform slight knee bends, weight shift and small steps in place. Patient stood x 8 minutes then 3 minutes. Transfer via Lift Equipment: Stedy  Ambulation/Gait                   Stairs             Wheelchair Mobility    Modified Rankin (Stroke Patients Only)       Balance Overall balance assessment: Needs assistance Sitting-balance support: Feet supported;Bilateral upper extremity supported Sitting balance-Leahy Scale: Fair     Standing balance support: Reliant on assistive device for balance;Single extremity supported Standing balance-Leahy Scale: Fair                              Cognition Arousal/Alertness: Awake/alert Behavior During Therapy: WFL for tasks assessed/performed;Flat affect Overall Cognitive Status: No family/caregiver present to determine baseline cognitive functioning                                 General Comments: except stated Turesday.        Exercises General Exercises - Lower Extremity  Ankle Circles/Pumps: AROM;Both;10 reps;Seated Short Arc Quad: AROM;10 reps;Both;Supine Hip ABduction/ADduction: AAROM;Both;10 reps;Supine    General Comments        Pertinent Vitals/Pain Pain Score: 8  Pain Location: L lower back Pain Descriptors / Indicators: Discomfort;Grimacing Pain Intervention(s): Monitored during session;Premedicated before session;Limited activity within patient's tolerance;Repositioned    Home Living                          Prior Function            PT Goals (current goals can now be found in the care plan section) Acute Rehab PT Goals PT Goal  Formulation: With patient Time For Goal Achievement: 12/04/21 Potential to Achieve Goals: Fair Progress towards PT goals: Progressing toward goals;Goals downgraded-see care plan (patient has become weaker)    Frequency    Min 3X/week      PT Plan Current plan remains appropriate (may need SNF/CIR)    Co-evaluation              AM-PAC PT "6 Clicks" Mobility   Outcome Measure  Help needed turning from your back to your side while in a flat bed without using bedrails?: A Lot Help needed moving from lying on your back to sitting on the side of a flat bed without using bedrails?: Total Help needed moving to and from a bed to a chair (including a wheelchair)?: A Lot Help needed standing up from a chair using your arms (e.g., wheelchair or bedside chair)?: A Lot Help needed to walk in hospital room?: Total Help needed climbing 3-5 steps with a railing? : Total 6 Click Score: 9    End of Session   Activity Tolerance: Patient tolerated treatment well Patient left: in bed;with call bell/phone within reach;with bed alarm set Nurse Communication: Mobility status;Other (comment);Patient requests pain meds PT Visit Diagnosis: Difficulty in walking, not elsewhere classified (R26.2);Muscle weakness (generalized) (M62.81);Pain     Time: 3154-0086 PT Time Calculation (min) (ACUTE ONLY): 45 min  Charges:  $Therapeutic Exercise: 8-22 mins $Therapeutic Activity: 23-37 mins                     Tresa Endo PT Acute Rehabilitation Services Pager [phone] Office [phone]    Claretha Cooper 11/20/2021, 4:57 PM

## 2021-11-20 NOTE — Progress Notes (Signed)
Supervising Physician: Ruthann Cancer  Patient Status:  [facility] - In-pt  Chief Complaint:  Intra-abdominal abscess.  Subjective:  Pt reports doing "terrible", but improved from yesterday.    Allergies: Hydrocodone  Medications: Prior to Admission medications   Medication Sig Start Date End Date Taking? Authorizing Provider  acetaminophen (TYLENOL) 500 MG tablet Take 1,000 mg by mouth every 4 (four) hours as needed for mild pain or fever.   Yes [provider]  fluticasone (FLONASE) 50 MCG/ACT nasal spray Place 2 sprays into both nostrils daily. 01/05/20  Yes [name], MD  gabapentin (NEURONTIN) 300 MG capsule TAKE 3 CAPSULES BY MOUTH AT BEDTIME Patient taking differently: Take 900 mg by mouth at bedtime. 03/09/21  Yes Patel, [name], DO  ibuprofen (ADVIL) 200 MG tablet Take 600 mg by mouth every 4 (four) hours as needed for mild pain.   Yes [provider]  ondansetron (ZOFRAN-ODT) 8 MG disintegrating tablet Take 8 mg by mouth every 8 (eight) hours as needed for nausea/vomiting. 11/06/21  Yes [provider]  oxyCODONE-acetaminophen (PERCOCET) 5-325 MG tablet Take 1 tablet by mouth every 6 (six) hours as needed for severe pain. 11/02/21 11/02/22 Yes [name], MD  SPIRIVA HANDIHALER 18 MCG inhalation capsule PLACE 1 CAPSULE( 18 MCG TOTAL) INTO INHALER AND INHALE DAILY Patient taking differently: Place 18 mcg into inhaler and inhale daily. 08/28/21  Yes [name], MD  tiZANidine (ZANAFLEX) 4 MG tablet Take 4 mg by mouth at bedtime.   Yes [provider]     Vital Signs: BP 119/66   Pulse (!) 117   Temp 98.7 F (37.1 C) (Oral)   Resp 20   Ht 5\' 7"  (1.702 m)   Wt 199 lb 11.8 oz (90.6 kg)   SpO2 95%   BMI 31.28 kg/m   Drain Location: LLQ Size: 38 F Date of placement: 11/18/21  Currently to: Drain collection device: gravity 24 hour output:  Output by Drain (mL) 11/18/21 0701 - 11/18/21 1900 11/18/21 1901 - 11/19/21  0700 11/19/21 0701 - 11/19/21 1900 11/19/21 1901 - 11/20/21 0700 11/20/21 0701 - 11/20/21 1623  Closed System Drain Left;Lateral;Inferior Abdomen Other (Comment) 12 Fr. 450 550 330 250 125    Current examination: Flushes/aspirates easily.  Insertion site unremarkable. Suture and stat lock in place. Dressed appropriately.  ~10cc yellow output with mild amount of particulate matter     Imaging: CT ABDOMEN PELVIS WO CONTRAST  Result Date: 11/17/2021 CLINICAL DATA:  Partial small bowel obstruction versus ileus, status post laparoscopic cystoprostatectomy and ileal conduit urinary diversion, adhesiolysis, small-bowel resection EXAM: CT CHEST, ABDOMEN AND PELVIS WITHOUT CONTRAST CT LUMBAR SPINE WITHOUT CONTRAST TECHNIQUE: Multidetector CT imaging of the chest, abdomen and pelvis was performed following the standard protocol without IV contrast. Oral enteric contrast was administered Multidetector CT imaging of the lumbar spine was performed following the standard protocol without IV contrast. COMPARISON:  11/14/2021 FINDINGS: CT CHEST FINDINGS Cardiovascular: Right upper extremity PICC. Normal heart size. Three-vessel coronary artery calcifications. No pericardial effusion. Mediastinum/Nodes: No enlarged mediastinal, hilar, or axillary lymph nodes. Thyroid gland, trachea, and esophagus demonstrate no significant findings. Lungs/Pleura: Mild centrilobular emphysema. Probable mild, underlying pulmonary fibrosis in a bibasilar predominance pattern featuring irregular peripheral interstitial opacity, evaluation limited by the presence of pleural effusions and breath motion artifact. Small bilateral pleural effusions. Musculoskeletal: No chest wall mass or suspicious bone lesions identified. CT ABDOMEN PELVIS FINDINGS Hepatobiliary: No solid liver abnormality is seen. No gallstones, gallbladder wall thickening,  or biliary dilatation. Pancreas: Unremarkable. No pancreatic ductal dilatation or surrounding  inflammatory changes. Spleen: Normal in size without significant abnormality. Adrenals/Urinary Tract: Adrenal glands are unremarkable. Status post cystoprostatectomy and right lower quadrant ileal conduit urinary diversion. Mild bilateral hydronephrosis. Bilateral double-J ureteral stents in position. The left renal pelvic pigtail is incompletely formed (series 2, image 76). Stomach/Bowel: Stomach is within normal limits. Appendix appears normal. The colon and rectum is diffusely distended and air and fluid-filled, measuring up to 8.5 cm in caliber. Vascular/Lymphatic: No significant vascular findings are present. No enlarged abdominal or pelvic lymph nodes. Reproductive: No mass or other abnormality. Other: Severe anasarca about the lower abdomen and pelvis (series 2, image 117). Subcutaneous emphysema seen on prior examination is almost entirely resolved (series 2, image 114). There is a large, elongated air and fluid collection in the left hemiabdomen about the left paracolic gutter, low midline abdomen, and pelvis, although difficult to exactly measure, at least 25 point by 8.0 x 6.2 cm (series 2, image 103, series 5, image 55, series 2, image 115). Musculoskeletal: No acute or significant osseous findings. LUMBAR SPINE: Alignment: Normal. Disc spaces: Status post posterior discectomy and fusion of L2 through S1 with partially incorporated disc spaces. No evidence of perihardware fracture or loosening. Vertebral bodies: Intact. IMPRESSION: 1. No evidence of bowel obstruction. The colon rectum are diffusely distended and air and fluid-filled, consistent with ileus. 2. There is a large, elongated air and fluid collection in the left hemiabdomen about the left paracolic gutter, low midline abdomen, and pelvis, although difficult to exactly measure, at least 25.5 cm in greatest dimension. Findings are consistent with abscess. 3. Status post cystoprostatectomy and right lower quadrant ileal conduit urinary diversion.  4. Mild bilateral hydronephrosis. Bilateral double-J ureteral stents in position. The left renal pelvic pigtail is incompletely formed. 5. Small bilateral pleural effusions. 6. Probable mild, underlying pulmonary fibrosis in a bibasilar predominance pattern featuring irregular peripheral interstitial opacity, evaluation limited by the presence of pleural effusions and breath motion artifact. This could be further evaluated by ILD protocol CT on an outpatient basis when clinically appropriate. 7. Coronary artery disease. 8. Status post posterior discectomy and fusion of L2 through S1 with partially incorporated disc spaces. No evidence of perihardware fracture or loosening. No acute findings of the lumbar spine. These results will be called to the ordering clinician or representative by the Radiologist Assistant, and communication documented in the PACS or [employer]. Electronically Signed   By: Delanna Ahmadi M.D.   On: 11/17/2021 12:00   CT CHEST WO CONTRAST  Result Date: 11/17/2021 CLINICAL DATA:  Partial small bowel obstruction versus ileus, status post laparoscopic cystoprostatectomy and ileal conduit urinary diversion, adhesiolysis, small-bowel resection EXAM: CT CHEST, ABDOMEN AND PELVIS WITHOUT CONTRAST CT LUMBAR SPINE WITHOUT CONTRAST TECHNIQUE: Multidetector CT imaging of the chest, abdomen and pelvis was performed following the standard protocol without IV contrast. Oral enteric contrast was administered Multidetector CT imaging of the lumbar spine was performed following the standard protocol without IV contrast. COMPARISON:  11/14/2021 FINDINGS: CT CHEST FINDINGS Cardiovascular: Right upper extremity PICC. Normal heart size. Three-vessel coronary artery calcifications. No pericardial effusion. Mediastinum/Nodes: No enlarged mediastinal, hilar, or axillary lymph nodes. Thyroid gland, trachea, and esophagus demonstrate no significant findings. Lungs/Pleura: Mild centrilobular emphysema. Probable  mild, underlying pulmonary fibrosis in a bibasilar predominance pattern featuring irregular peripheral interstitial opacity, evaluation limited by the presence of pleural effusions and breath motion artifact. Small bilateral pleural effusions. Musculoskeletal: No chest wall mass or suspicious  bone lesions identified. CT ABDOMEN PELVIS FINDINGS Hepatobiliary: No solid liver abnormality is seen. No gallstones, gallbladder wall thickening, or biliary dilatation. Pancreas: Unremarkable. No pancreatic ductal dilatation or surrounding inflammatory changes. Spleen: Normal in size without significant abnormality. Adrenals/Urinary Tract: Adrenal glands are unremarkable. Status post cystoprostatectomy and right lower quadrant ileal conduit urinary diversion. Mild bilateral hydronephrosis. Bilateral double-J ureteral stents in position. The left renal pelvic pigtail is incompletely formed (series 2, image 76). Stomach/Bowel: Stomach is within normal limits. Appendix appears normal. The colon and rectum is diffusely distended and air and fluid-filled, measuring up to 8.5 cm in caliber. Vascular/Lymphatic: No significant vascular findings are present. No enlarged abdominal or pelvic lymph nodes. Reproductive: No mass or other abnormality. Other: Severe anasarca about the lower abdomen and pelvis (series 2, image 117). Subcutaneous emphysema seen on prior examination is almost entirely resolved (series 2, image 114). There is a large, elongated air and fluid collection in the left hemiabdomen about the left paracolic gutter, low midline abdomen, and pelvis, although difficult to exactly measure, at least 25 point by 8.0 x 6.2 cm (series 2, image 103, series 5, image 55, series 2, image 115). Musculoskeletal: No acute or significant osseous findings. LUMBAR SPINE: Alignment: Normal. Disc spaces: Status post posterior discectomy and fusion of L2 through S1 with partially incorporated disc spaces. No evidence of perihardware  fracture or loosening. Vertebral bodies: Intact. IMPRESSION: 1. No evidence of bowel obstruction. The colon rectum are diffusely distended and air and fluid-filled, consistent with ileus. 2. There is a large, elongated air and fluid collection in the left hemiabdomen about the left paracolic gutter, low midline abdomen, and pelvis, although difficult to exactly measure, at least 25.5 cm in greatest dimension. Findings are consistent with abscess. 3. Status post cystoprostatectomy and right lower quadrant ileal conduit urinary diversion. 4. Mild bilateral hydronephrosis. Bilateral double-J ureteral stents in position. The left renal pelvic pigtail is incompletely formed. 5. Small bilateral pleural effusions. 6. Probable mild, underlying pulmonary fibrosis in a bibasilar predominance pattern featuring irregular peripheral interstitial opacity, evaluation limited by the presence of pleural effusions and breath motion artifact. This could be further evaluated by ILD protocol CT on an outpatient basis when clinically appropriate. 7. Coronary artery disease. 8. Status post posterior discectomy and fusion of L2 through S1 with partially incorporated disc spaces. No evidence of perihardware fracture or loosening. No acute findings of the lumbar spine. These results will be called to the ordering clinician or representative by the Radiologist Assistant, and communication documented in the PACS or [employer]. Electronically Signed   By: Delanna Ahmadi M.D.   On: 11/17/2021 12:00   CT LUMBAR SPINE WO CONTRAST  Result Date: 11/17/2021 CLINICAL DATA:  Partial small bowel obstruction versus ileus, status post laparoscopic cystoprostatectomy and ileal conduit urinary diversion, adhesiolysis, small-bowel resection EXAM: CT CHEST, ABDOMEN AND PELVIS WITHOUT CONTRAST CT LUMBAR SPINE WITHOUT CONTRAST TECHNIQUE: Multidetector CT imaging of the chest, abdomen and pelvis was performed following the standard protocol without IV  contrast. Oral enteric contrast was administered Multidetector CT imaging of the lumbar spine was performed following the standard protocol without IV contrast. COMPARISON:  11/18/2021 FINDINGS: CT CHEST FINDINGS Cardiovascular: Right upper extremity PICC. Normal heart size. Three-vessel coronary artery calcifications. No pericardial effusion. Mediastinum/Nodes: No enlarged mediastinal, hilar, or axillary lymph nodes. Thyroid gland, trachea, and esophagus demonstrate no significant findings. Lungs/Pleura: Mild centrilobular emphysema. Probable mild, underlying pulmonary fibrosis in a bibasilar predominance pattern featuring irregular peripheral interstitial opacity, evaluation limited by the  presence of pleural effusions and breath motion artifact. Small bilateral pleural effusions. Musculoskeletal: No chest wall mass or suspicious bone lesions identified. CT ABDOMEN PELVIS FINDINGS Hepatobiliary: No solid liver abnormality is seen. No gallstones, gallbladder wall thickening, or biliary dilatation. Pancreas: Unremarkable. No pancreatic ductal dilatation or surrounding inflammatory changes. Spleen: Normal in size without significant abnormality. Adrenals/Urinary Tract: Adrenal glands are unremarkable. Status post cystoprostatectomy and right lower quadrant ileal conduit urinary diversion. Mild bilateral hydronephrosis. Bilateral double-J ureteral stents in position. The left renal pelvic pigtail is incompletely formed (series 2, image 76). Stomach/Bowel: Stomach is within normal limits. Appendix appears normal. The colon and rectum is diffusely distended and air and fluid-filled, measuring up to 8.5 cm in caliber. Vascular/Lymphatic: No significant vascular findings are present. No enlarged abdominal or pelvic lymph nodes. Reproductive: No mass or other abnormality. Other: Severe anasarca about the lower abdomen and pelvis (series 2, image 117). Subcutaneous emphysema seen on prior examination is almost entirely  resolved (series 2, image 114). There is a large, elongated air and fluid collection in the left hemiabdomen about the left paracolic gutter, low midline abdomen, and pelvis, although difficult to exactly measure, at least 25 point by 8.0 x 6.2 cm (series 2, image 103, series 5, image 55, series 2, image 115). Musculoskeletal: No acute or significant osseous findings. LUMBAR SPINE: Alignment: Normal. Disc spaces: Status post posterior discectomy and fusion of L2 through S1 with partially incorporated disc spaces. No evidence of perihardware fracture or loosening. Vertebral bodies: Intact. IMPRESSION: 1. No evidence of bowel obstruction. The colon rectum are diffusely distended and air and fluid-filled, consistent with ileus. 2. There is a large, elongated air and fluid collection in the left hemiabdomen about the left paracolic gutter, low midline abdomen, and pelvis, although difficult to exactly measure, at least 25.5 cm in greatest dimension. Findings are consistent with abscess. 3. Status post cystoprostatectomy and right lower quadrant ileal conduit urinary diversion. 4. Mild bilateral hydronephrosis. Bilateral double-J ureteral stents in position. The left renal pelvic pigtail is incompletely formed. 5. Small bilateral pleural effusions. 6. Probable mild, underlying pulmonary fibrosis in a bibasilar predominance pattern featuring irregular peripheral interstitial opacity, evaluation limited by the presence of pleural effusions and breath motion artifact. This could be further evaluated by ILD protocol CT on an outpatient basis when clinically appropriate. 7. Coronary artery disease. 8. Status post posterior discectomy and fusion of L2 through S1 with partially incorporated disc spaces. No evidence of perihardware fracture or loosening. No acute findings of the lumbar spine. These results will be called to the ordering clinician or representative by the Radiologist Assistant, and communication documented in the  PACS or [employer]. Electronically Signed   By: Delanna Ahmadi M.D.   On: 11/17/2021 12:00   DG CHEST PORT 1 VIEW  Result Date: 11/17/2021 CLINICAL DATA:  Back pain, weakness, short of breath EXAM: PORTABLE CHEST 1 VIEW COMPARISON:  11/10/2021 FINDINGS: Central line removed.  NG removed. Mild right lower lobe airspace disease unchanged Improvement in left lower lobe airspace disease and small left effusion. Negative for pulmonary edema. IMPRESSION: Mild improvement in left lower lobe airspace disease and left effusion. No change in right lower lobe airspace disease. Electronically Signed   By: Franchot Gallo M.D.   On: 11/17/2021 10:50   DG Abd Portable 1V-Small Bowel Obstruction Protocol-initial, 8 hr delay  Result Date: 11/16/2021 CLINICAL DATA:  Small bowel protocol, 8 hour postcontrast film. EXAM: PORTABLE ABDOMEN - 1 VIEW COMPARISON:  Abdominal x-ray 11/16/2021, 9:36  a.m. FINDINGS: No significant oral contrast identified. Diffusely air-filled dilated large and small bowel appears similar to the prior study. Ureteral stents are unchanged in position. Catheters overlie the right abdomen. Lumbar fusion hardware is unchanged. There are no suspicious calcifications. No acute fractures are identified. IMPRESSION: 1. No oral contrast visualized. Stable diffuse small and large bowel dilatation. Electronically Signed   By: Ronney Asters M.D.   On: 11/16/2021 23:05   CT IMAGE GUIDED DRAINAGE BY PERCUTANEOUS CATHETER  Result Date: 11/18/2021 INDICATION: 80 year old male with fluid collection lower abdomen referred for drainage EXAM: CT GUIDED DRAINAGE OF ABDOMINAL ABSCESS MEDICATIONS: The patient is currently admitted to the hospital and receiving intravenous antibiotics. The antibiotics were administered within an appropriate time frame prior to the initiation of the procedure. ANESTHESIA/SEDATION: 0 mg IV Versed 25 mcg IV Fentanyl Moderate Sedation Time:  0 moderate sedation not used. The patient was  continuously monitored during the procedure by the interventional radiology nurse under my direct supervision. COMPLICATIONS: None TECHNIQUE: Informed written consent was obtained from the patient after a thorough discussion of the procedural risks, benefits and alternatives. All questions were addressed. Maximal Sterile Barrier Technique was utilized including caps, mask, sterile gowns, sterile gloves, sterile drape, hand hygiene and skin antiseptic. A timeout was performed prior to the initiation of the procedure. PROCEDURE: The left lower quadrant was prepped with chlorhexidine in a sterile fashion, and a sterile drape was applied covering the operative field. A sterile gown and sterile gloves were used for the procedure. Local anesthesia was provided with 1% Lidocaine. After scout CT was acquired, the patient is prepped and draped in the usual sterile fashion. 1% lidocaine was used for local anesthesia. Using CT guidance, modified Seldinger technique was used to place a 12 [place] drain into the fluid of the left lower quadrant. Sample sent for culture. Catheter was sutured in position and attached to gravity bag. Final CT was acquired. Proximally 350 cc of fluid removed. Patient tolerated the procedure well and remained hemodynamically stable throughout. No complications were encountered and no significant blood loss. FINDINGS: Twelve French drain into left lower quadrant fluid collection with near complete decompression. Approximally 3 in 50 cc of thin turbid yellow fluid IMPRESSION: Status post CT-guided drainage of left lower quadrant fluid collection. Signed, Dulcy Fanny. Dellia Nims, RPVI [facility] Electronically Signed   By: Corrie Mckusick D.O.   On: 11/18/2021 12:20    Labs:  CBC: Recent Labs    11/18/21 0850 11/18/21 1928 11/19/21 0358 11/20/21 0800  WBC 19.6* 18.3* 13.4* 8.1  HGB 6.8* 8.0* 7.5* 8.0*  HCT 21.7* 25.4* 23.7* 25.1*  PLT  282 287 317 315    COAGS: Recent Labs    06/29/21 0830 11/14/2021 1016 11/10/21 0500 11/18/21 1928  INR 1.0 1.4* 1.5* 1.3*  APTT  --  38*  --   --     BMP: Recent Labs    11/17/21 0521 11/18/21 0158 11/19/21 0358 11/20/21 0540  NA 135 135 136 135  K 3.6 3.6 3.5 3.0*  CL 104 104 105 104  CO2 24 24 23  21*  GLUCOSE 126* 119* 73 85  BUN 22 31* 33* 24*  CALCIUM 7.4* 7.2* 7.0* 7.3*  CREATININE 1.74* 2.56* 2.58* 1.67*  GFRNONAA 39* 25* 25* 41*    LIVER FUNCTION TESTS: Recent Labs    11/17/21 0521 11/18/21 0158 11/19/21 0358 11/20/21 0540  BILITOT 0.8 0.7 0.7 0.7  AST 34 30 26 26   ALT  19 17 15 16   ALKPHOS 96 84 74 78  PROT 5.5* 5.6* 4.8* 5.7*  ALBUMIN 1.7* 1.8* 1.7* 1.6*    Assessment:  LLQ abscess --Drain placed 11/19 by [name] --Still with large volume output --WBC down to 8.1 (from 13.4)  Plan:  Continue TID flushes with 5 cc NS. Record output Q shift. Dressing changes QD or PRN if soiled.  Call IR APP or on call IR MD if difficulty flushing or sudden change in drain output.  Repeat imaging/possible drain injection once output < 10 mL/QD (excluding flush material.)    IR will continue to follow - please call with questions or concerns.  Electronically Signed: Pasty Spillers, PA 11/20/2021, 4:19 PM   I spent a total of 25 Minutes at the the patient's bedside AND on the patient's hospital floor or unit, greater than 50% of which was counseling/coordinating care for LLQ abscess

## 2021-11-21 DIAGNOSIS — R6521 Severe sepsis with septic shock: Secondary | ICD-10-CM | POA: Diagnosis not present

## 2021-11-21 DIAGNOSIS — A419 Sepsis, unspecified organism: Secondary | ICD-10-CM | POA: Diagnosis not present

## 2021-11-21 LAB — CBC WITH DIFFERENTIAL/PLATELET
Abs Immature Granulocytes: 0.13 10*3/uL — ABNORMAL HIGH (ref 0.00–0.07)
Basophils Absolute: 0 10*3/uL (ref 0.0–0.1)
Basophils Relative: 0 %
Eosinophils Absolute: 0.2 10*3/uL (ref 0.0–0.5)
Eosinophils Relative: 2 %
HCT: 24.4 % — ABNORMAL LOW (ref 39.0–52.0)
Hemoglobin: 7.7 g/dL — ABNORMAL LOW (ref 13.0–17.0)
Immature Granulocytes: 2 %
Lymphocytes Relative: 13 %
Lymphs Abs: 1.1 10*3/uL (ref 0.7–4.0)
MCH: 29.1 pg (ref 26.0–34.0)
MCHC: 31.6 g/dL (ref 30.0–36.0)
MCV: 92.1 fL (ref 80.0–100.0)
Monocytes Absolute: 0.6 10*3/uL (ref 0.1–1.0)
Monocytes Relative: 7 %
Neutro Abs: 6.4 10*3/uL (ref 1.7–7.7)
Neutrophils Relative %: 76 %
Platelets: 315 10*3/uL (ref 150–400)
RBC: 2.65 MIL/uL — ABNORMAL LOW (ref 4.22–5.81)
RDW: 15.4 % (ref 11.5–15.5)
WBC: 8.4 10*3/uL (ref 4.0–10.5)
nRBC: 0 % (ref 0.0–0.2)

## 2021-11-21 LAB — GLUCOSE, CAPILLARY
Glucose-Capillary: 94 mg/dL (ref 70–99)
Glucose-Capillary: 98 mg/dL (ref 70–99)
Glucose-Capillary: 91 mg/dL (ref 70–99)

## 2021-11-21 LAB — RENAL FUNCTION PANEL
Albumin: 1.5 g/dL — ABNORMAL LOW (ref 3.5–5.0)
Anion gap: 7 (ref 5–15)
BUN: 26 mg/dL — ABNORMAL HIGH (ref 8–23)
CO2: 22 mmol/L (ref 22–32)
Calcium: 7.2 mg/dL — ABNORMAL LOW (ref 8.9–10.3)
Chloride: 107 mmol/L (ref 98–111)
Creatinine, Ser: 2.45 mg/dL — ABNORMAL HIGH (ref 0.61–1.24)
GFR, Estimated: 26 mL/min — ABNORMAL LOW (ref >60)
Glucose, Bld: 89 mg/dL (ref 70–99)
Phosphorus: 3.5 mg/dL (ref 2.5–4.6)
Potassium: 3.3 mmol/L — ABNORMAL LOW (ref 3.5–5.1)
Sodium: 136 mmol/L (ref 135–145)

## 2021-11-21 LAB — COMPREHENSIVE METABOLIC PANEL
ALT: 16 U/L (ref 0–44)
AST: 27 U/L (ref 15–41)
Albumin: <1.5 g/dL — ABNORMAL LOW (ref 3.5–5.0)
Alkaline Phosphatase: 67 U/L (ref 38–126)
Anion gap: 8 (ref 5–15)
BUN: 27 mg/dL — ABNORMAL HIGH (ref 8–23)
CO2: 22 mmol/L (ref 22–32)
Calcium: 7.1 mg/dL — ABNORMAL LOW (ref 8.9–10.3)
Chloride: 106 mmol/L (ref 98–111)
Creatinine, Ser: 2.48 mg/dL — ABNORMAL HIGH (ref 0.61–1.24)
GFR, Estimated: 26 mL/min — ABNORMAL LOW (ref >60)
Glucose, Bld: 89 mg/dL (ref 70–99)
Potassium: 3.2 mmol/L — ABNORMAL LOW (ref 3.5–5.1)
Sodium: 136 mmol/L (ref 135–145)
Total Bilirubin: 0.7 mg/dL (ref 0.3–1.2)
Total Protein: 5.2 g/dL — ABNORMAL LOW (ref 6.5–8.1)

## 2021-11-21 LAB — CBC
HCT: 30 % — ABNORMAL LOW (ref 39.0–52.0)
Hemoglobin: 9.7 g/dL — ABNORMAL LOW (ref 13.0–17.0)
MCH: 29.3 pg (ref 26.0–34.0)
MCHC: 32.3 g/dL (ref 30.0–36.0)
MCV: 90.6 fL (ref 80.0–100.0)
Platelets: 315 10*3/uL (ref 150–400)
RBC: 3.31 MIL/uL — ABNORMAL LOW (ref 4.22–5.81)
RDW: 15.4 % (ref 11.5–15.5)
WBC: 9.3 10*3/uL (ref 4.0–10.5)
nRBC: 0 % (ref 0.0–0.2)

## 2021-11-21 LAB — PREPARE RBC (CROSSMATCH)

## 2021-11-21 LAB — HSV DNA BY PCR (REFERENCE LAB)
HSV 1 DNA: POSITIVE — AB
HSV 2 DNA: NEGATIVE

## 2021-11-21 LAB — MAGNESIUM: Magnesium: 1.9 mg/dL (ref 1.7–2.4)

## 2021-11-21 MED ORDER — SODIUM CHLORIDE 0.9% IV SOLUTION: Freq: Once | INTRAVENOUS | Status: AC

## 2021-11-21 MED ORDER — POTASSIUM CHLORIDE 20 MEQ PO PACK
40.0000 meq | PACK | Freq: Two times a day (BID) | ORAL | Status: DC
  Administered 2021-11-21: 40 meq via ORAL
  Filled 2021-11-21 (×4): qty 2

## 2021-11-21 MED ORDER — PROSOURCE PLUS PO LIQD
30.0000 mL | Freq: Two times a day (BID) | ORAL | Status: DC
  Administered 2021-11-21 – 2021-11-23 (×3): 30 mL via ORAL
  Filled 2021-11-21 (×3): qty 30

## 2021-11-21 MED ORDER — SACCHAROMYCES BOULARDII 250 MG PO CAPS
250.0000 mg | ORAL_CAPSULE | Freq: Two times a day (BID) | ORAL | Status: DC
  Administered 2021-11-21 – 2021-11-24 (×5): 250 mg via ORAL
  Filled 2021-11-21 (×7): qty 1

## 2021-11-21 MED ORDER — SODIUM CHLORIDE 0.9 % IV SOLN: INTRAVENOUS | Status: DC | PRN

## 2021-11-21 MED ORDER — ADULT MULTIVITAMIN W/MINERALS CH
1.0000 | ORAL_TABLET | Freq: Every day | ORAL | Status: DC
  Administered 2021-11-21 – 2021-11-23 (×3): 1 via ORAL
  Filled 2021-11-21 (×4): qty 1

## 2021-11-21 MED ORDER — MAGIC MOUTHWASH W/LIDOCAINE
10.0000 mL | Freq: Four times a day (QID) | ORAL | Status: AC
  Administered 2021-11-21 – 2021-11-23 (×11): 10 mL via ORAL
  Filled 2021-11-21 (×12): qty 10

## 2021-11-21 MED ORDER — POTASSIUM CHLORIDE 10 MEQ/100ML IV SOLN
10.0000 meq | INTRAVENOUS | Status: AC
  Administered 2021-11-21 – 2021-11-22 (×2): 10 meq via INTRAVENOUS

## 2021-11-21 NOTE — Progress Notes (Signed)
Occupational Therapy Treatment Patient Details Name: KYROS SALZWEDEL MRN: [id number] DOB: 1941-08-12 Today's Date: 11/21/2021   History of present illness Patient  is a 80 year old male who presented from urology clinic on 11/10 with low blood pressure. patient was found to have septic shock. PMH: s/p robotic cystoprostatectomy, pelvic node dissection , conduit diversion, extensive adhesioloysis , small bowel resection and cysto / ICG injection on 10/25/21 due to bladder cancer, tinnitus, Covid-19, back surgery   OT comments  Patients HR was noted to increase to 135 BPM with transfer to recliner chair. Patient was re educated on importance of participation in tasks he can complete for himself daily. Patient has demonstrated moments of learned helplessness ( asking for wife to feed him). Patient was able to demonstrate ability to participate in self feeding tasks after set up. Patient was modA  to transfer from edge of bed to recliner with max cues for sequencing and +2 for line management. Patient would continue to benefit from skilled OT services at this time while admitted to address noted deficits in order to improve overall safety and independence in ADLs.     Recommendations for follow up therapy are one component of a multi-disciplinary discharge planning process, led by the attending physician.  Recommendations may be updated based on patient status, additional functional criteria and insurance authorization.    Follow Up Recommendations  No OT follow up    Assistance Recommended at Discharge Frequent or constant Supervision/Assistance  Equipment Recommendations  None recommended by OT    Recommendations for Other Services      Precautions / Restrictions Precautions Precautions: Fall Precaution Comments: urostomy, left drain, has been getting weaker,fecal tube Restrictions Weight Bearing Restrictions: No       Mobility Bed Mobility Overal bed mobility: Needs Assistance Bed  Mobility: Supine to Sit;Rolling     Supine to sit: Max assist;HOB elevated     General bed mobility comments: patient needed physical assist with BLE and trunk with max A to scoot to edge of bed.    Transfers Overall transfer level: Needs assistance Equipment used: Rolling walker (2 wheels) Transfers: Sit to/from Stand Sit to Stand: From elevated surface;Mod assist           General transfer comment: patient was mod A to complete transfer with posterior leaning when standing to get back to bed. patient required increased assistance to maintain upright posture. needed +2 for line management     Balance Overall balance assessment: Needs assistance Sitting-balance support: Feet supported;Bilateral upper extremity supported Sitting balance-Leahy Scale: Fair     Standing balance support: Reliant on assistive device for balance;Single extremity supported Standing balance-Leahy Scale: Poor Standing balance comment: posterior leaning while standing.                           ADL either performed or assessed with clinical judgement   ADL Overall ADL's : Needs assistance/impaired Eating/Feeding: Set up;Sitting Eating/Feeding Details (indicate cue type and reason): in recliner. patient was educated on importance of participation in tasks. patient had requested for wife to feed him. patient and wife educted on preventing learned helplessness.                     Toilet Transfer: Moderate assistance Toilet Transfer Details (indicate cue type and reason): patient was mod A to transfer to recliner in room wiht increased time and patient demonstrating posterior leaning with RW with continued education to  reduce leaning.                Extremity/Trunk Assessment Upper Extremity Assessment RUE Deficits / Details: patietn noted to have increased edmea in bilateral forearms with patient still able to participate in ROM of BUE within normal limits. MMT not tested on  this date.            Vision Patient Visual Report: No change from baseline     Perception     Praxis      Cognition Arousal/Alertness: Awake/alert Behavior During Therapy: WFL for tasks assessed/performed;Flat affect Overall Cognitive Status: No family/caregiver present to determine baseline cognitive functioning                                 General Comments: wife was present.          Exercises     Shoulder Instructions       General Comments      Pertinent Vitals/ Pain       Pain Assessment: Faces Faces Pain Scale: Hurts even more Pain Location: L lower back Pain Descriptors / Indicators: Discomfort;Grimacing Pain Intervention(s): Monitored during session;Repositioned  Home Living                                          Prior Functioning/Environment              Frequency  Min 2X/week        Progress Toward Goals  OT Goals(current goals can now be found in the care plan section)  Progress towards OT goals: OT to reassess next treatment (patient had recent medical change and is now in SDU)  Acute Rehab OT Goals OT Goal Formulation: With patient Time For Goal Achievement: 11/26/21 Potential to Achieve Goals: Good  Plan Discharge plan remains appropriate    Co-evaluation                 AM-PAC OT "6 Clicks" Daily Activity     Outcome Measure   Help from another person eating meals?: A Little Help from another person taking care of personal grooming?: A Little Help from another person toileting, which includes using toliet, bedpan, or urinal?: A Lot Help from another person bathing (including washing, rinsing, drying)?: A Lot Help from another person to put on and taking off regular upper body clothing?: A Little Help from another person to put on and taking off regular lower body clothing?: A Lot 6 Click Score: 15    End of Session Equipment Utilized During Treatment: Rolling walker (2  wheels)  OT Visit Diagnosis: Unsteadiness on feet (R26.81);Muscle weakness (generalized) (M62.81);Pain   Activity Tolerance Patient tolerated treatment well   Patient Left in chair;with call bell/phone within reach;with chair alarm set;with family/visitor present   Nurse Communication Other (comment) (nurse present during session)        Time: [phone] OT Time Calculation (min): 18 min  Charges: OT General Charges $OT Visit: 1 Visit OT Treatments $Self Care/Home Management : 8-22 mins  Jackelyn Poling OTR/L, MS Acute Rehabilitation Department Office# [phone] Pager# [phone]   Marcellina Millin 11/21/2021, 11:30 AM

## 2021-11-21 NOTE — Progress Notes (Signed)
PROGRESS NOTE    William [name]  DXA:[id number] DOB: 1941-03-03 DOA: 11/08/2021 PCP: [name], MD    Brief Narrative:  William [name] is a 80 year old male with past medical history significant for bladder cancer s/p robotic assisted laparoscopic radical cystectomy/prostatectomy on 10/25/21 by [name], bilateral pelvic lymph node dissection, extensive laparoscopic adhesiolysis, small bowel resection with ileal conduit, urinary diversion 10/26/21 by [name] (discharged by urology on post op day#8 11/02/21), COPD, history of tobacco use disorder who presented to [facility] ED on 11/10/2021 from urology clinic with finding of low blood pressure with SBP in the 50's.  Work-up revealed septic shock secondary to Klebsiella oxytoca/Enterococcus faecalis UTI and Klebsiella oxytoca bacteremia, POA.  Was placed on vasopressors and admitted to the ICU.  Care transferred to [facility], Hospitalist service, on 11/11/2021.  Hospital course complicated by ileus/partial small bowel obstruction for which he was seen by general surgery.  He has regained bowel function, general surgery signed off on 11/19/2021.  Anasarca and worsening renal failure for which he was seen by nephrology.  His renal function improved with good urine output, nephrology signed off on 11/20/2021.  He developed a large intra-abdominal abscess measuring 25.5 cm seen on CT scan taken on 11/17/2021.  Due to worsening leukocytosis and tachycardia, blood cultures were obtained, 2 out of 2 bottles returned positive for Candida albicans.  Started on IV Diflucan.  Infectious disease was consulted, IV Diflucan was switched to Eraxis due to concern for intra-abdominal abscess as a source of the fungemia.  Seen by ophthalmology, [name] Close, eye exam was benign with no evidence of fungal endophthalmitis.  The intra-abdominal abscess was drained by interventional radiology [name] on 11/18/2021, currently on Zosyn until cultures result.  Recurrent Loose  stools- C. difficile negative on 11/17/2021.  Florastor 250 mg BID started 11/21/21. 2U PRBCs ordered to be transfused on 11/21/21 for Hg 7.7, baseline Hg 13.  11/21/2021: Seen and examined at bedside.  His wife present.  He feels weak but is open and receptive to physical therapy today. 2U PRBCs ordered to be transfused for Hg 7.7, baseline Hg 13.  Assessment & Plan:   Principal Problem:   Septic shock ([place]) Active Problems:   Chronic pain   Bladder cancer ([place])   Bacteremia due to Gram-negative bacteria   UTI due to Klebsiella species  Septic shock, sepsis criteria is improving, no longer on vasopressor, secondary to Klebsiella oxytoca/Enterococcus faecalis UTI/Klebsiella oxytoca bacteremia, newly diagnosed Candida albicans fungemia Patient presenting to ED from urology clinic after being found with systolic blood pressures in the 50s.  Received Levophed vasopressor, and was admitted to the ICU.  Then transferred to [facility] on 11/11/2021.  Source of infection thought to be from urinary tract.   Patient was started on empiric antibiotics initially with IV vancomycin, cefepime, IV metronidazole and was de-escalated to ceftriaxone based on cultures, then later de-escalated to Unasyn.  Off vasopressors and maintaining MAP greater than 65.  Unasyn was switched to Zosyn on 11/17/2021 due to findings of intra-abdominal abscess. Blood cultures obtained on 11/17/2021 positive for Candida albicans.  Seen by ophthalmology, eye exam was benign.  Infectious disease following.   Procalcitonin down trended 0.74, peaked at 41.64 on 11/04/2021. --Blood cultures x2: Klebsiella oxytoca --Urine culture > 100,000 colonies of Klebsiella oxytoca and 100,000 colonies of Enterococcus faecalis. Leukocytosis has resolved.  Intra-abdominal abscess seen on CT scan measuring 25.5 cm status post image guided drain placement left lower quadrant by interventional radiology [name]  on 11/18/2021. Switched Unasyn to Zosyn  11/17/21 Eraxis started on 11/18/2021. IR following and recommending follow-up imaging once drain output is minimal or if WBC rises significantly. Continue to closely monitor WBC and fever curve.  Newly diagnosed Candida albicans fungemia From blood cultures drawn on 11/17/2021, 2 out of 2 bottles positive for Candida albicans. IV Diflucan started on 11/18/2021, switched to Eraxis due to intra-abdominal abscess suspected as a source of the fungemia. Infectious disease consulted Benign eye exam, completed by ophthalmology [name] Close on 11/19/2021.  No evidence of fungal endophthalmitis.  Small bowel obstruction, now ileus/partial small bowel obstruction. CT abdomen/pelvis without contrast on 11/15/2021 with concern for small bowel obstruction with possible transition point in the lower abdomen/pelvis near surgical anastomosis site. NG tube removed on 11/14/2021. --General surgery/urology followed Diet advanced to dysphagia 3 on 11/20/2021. TPN on hold since 11/18/21 due to fungemia. Seen by dietitian, continue recommendations and oral supplements Continue to optimize magnesium and potassium levels. Continue to mobilize as tolerated. Goal potassium greater than 4.0 Goal magnesium greater than 2.0. Continue to mobilize as tolerated  Acute blood loss anemia Baseline hemoglobin 13 Received 1 unit PRBC transfusion on 11/18/2021 for hemoglobin of 6.8. Hemoglobin 7.7 on 11/21/2021, 2 units PRBCs ordered to be transfused, repeat CBC posttransfusion Continue to monitor H&H.  Improving delirium secondary to acute illness Continue nonpharmacological and pharmacological delirium precautions and management. Continue to regular sleep/awake cycle, open blinds during the day, out of bed to chair during the day, mobilize as tolerated with assistance during the day, and at night antipsychotic and sleep aid with trazodone. Continue Seroquel 12.5 mg nightly and trazodone nightly Monitor QTC  Anasarca  suspect secondary to hypoalbuminemia, severe anemia, acute CHF Has received IV albumin 12.5 mg every 6 hours x2 doses. Management per nephrology.  Acute hypoxic respiratory failure secondary to bilateral pleural effusions Not on oxygen supplementation at baseline Currently on 4 L O2 saturation of 98 to 99% Continue to wean off oxygen supplementation as tolerated Incentive spirometer/flutter valve.  Bilateral pleural effusion secondary to volume overload Has received IV Lasix Continue incentive spirometer and flutter valve Continue to mobilize as tolerated Appreciate nephrology's assistance. CT chest ordered, personally reviewed showing bilateral pleural effusions with bilateral atelectasis.  Sinus tachycardia suspect related to sepsis and ongoing lung physiology HR 120-130's>> improving 105-113 Continue to treat underlying conditions PRN Lopressor 2.5 mg q6H if HR is consistently>130  Nonoliguric AKI suspect secondary to intravascular volume depletion, improving. Etiology likely secondary to prolonged prerenal state from  --Cr 2.87>3.00>1.67>1.21>1.04>0.90>0.86> 1.74> 2.56> 1.6> 2.4  Not on IV fluid due to anasarca Has received IV albumin 12.5 mg every 6 hours x2 doses. TPN started on 11/16/2021, held on 11/18/2021 due to candidemia.  Improving severe left flank/back pain secondary to intra-abdominal large abscess seen on CT scan. At baseline on gabapentin, oxycodone, Zanaflex. No acute findings on CT lumbar spine. Continue pain control with IV narcotics as needed for severe pain  Acute on chronic diastolic CHF, likely exacerbated by volume overload BNP 323 Bilateral pleural effusions Mild pulmonary edema Continue strict I's and O's and daily weight  Oral thrush Continue IV Eraxis Continue oral care Magic mouthwash with lidocaine  Refractory hypokalemia post repletion Potassium 3.0 from 3.6> 3.3. Repleted orally Continue to replete electrolytes as indicated.  Bladder  cancer s/p radical cystectomy, prostatectomy and ileal conduit by urology [name] on 10/25/2021, and 10/26/2021. Underwent robotic assisted laparoscopic radical cystectomy, robotic radical prostatectomy, bilateral pelvic lymph node dissection, extensive laparoscopic adhesiolysis, small  bowel resection and ileal conduit urinary diversion on 10/25/2021 and on 10/26/2021 by [name]. --Urology following, appreciate assistance Continue ostomy care;  Continue to monitor urinary output  COPD History of tobacco abuse --Incruse Ellipta 1 puff daily --DuoNebs as needed Continue to maintain O2 saturation greater than 90%.  Suspected ILD Noted on CT imaging this admission.  History of tobacco abuse. --Flonase --Outpatient pulmonology follow-up  Weakness/deconditioning/debility: --PT/OT recommending home health on discharge. Continue to mobilize as tolerated Continue fall precautions. Alternate PT OT to have daily therapy  Severe protein calorie malnutrition. Albumin 1.7 on 11/17/2021 Albumin 1.5 on 11/21/2021. Encourage oral protein calorie intake Liberalize diet Continue oral supplement Obtain prealbumin on 11/22/2021.  Critical care time: 65 minutes.   DVT prophylaxis: heparin injection 5,000 Units Start: 11/10/2021 1515 SCDs Start: 11/03/2021 1502   Code Status: Full Code Family Communication: Updated his wife at bedside on 11/21/2021.  Also updated his son via phone.  Disposition Plan:  Level of care: Stepdown, Transfer to stepdown unit on 11/17/21. Status is: Inpatient  Remains inpatient appropriate because: Continues on IV antibiotics, NG tube removed today, general surgery advancing diet, needs to demonstrate toleration of diet before able discharge home.   Consultants:  PCCM - signed off 11/12 General surgery Urology Dietitian  Procedures:  Central line placement 11/10  Antimicrobials:  Ceftriaxone 11/11 - 11/13 Vancomycin 11/10 - 11/11 Cefepime 11/10 -  11/11 Metronidazole 11/10 - 11/10 Unasyn 11/13>> 11/18/2021. Zosyn 11/18/2021     Objective: Vitals:   11/21/21 1100 11/21/21 1140 11/21/21 1230 11/21/21 1250  BP: 109/63  126/83 (!) 120/59  Pulse: (!) 118  (!) 105   Resp: 18  17   Temp:  98.3 F (36.8 C) 98.5 F (36.9 C) 98.5 F (36.9 C)  TempSrc:  Axillary Oral Oral  SpO2: 98%     Weight:      Height:        Intake/Output Summary (Last 24 hours) at 11/21/2021 1330 Last data filed at 11/21/2021 1247 Gross per 24 hour  Intake 435.1 ml  Output 2200 ml  Net -1764.9 ml   Filed Weights   11/19/21 0359 11/20/21 0500 11/21/21 0500  Weight: 86.8 kg 90.6 kg 91.2 kg    Examination:  General exam: Frail-appearing no acute distress.  He is alert and oriented x3.   Respiratory system: Mild rales at bases with no wheezing noted.  Poor inspiratory effort.   Cardiovascular system: Regular rate and rhythm no rubs or gallops.   Gastrointestinal system: Mildly tender nondistended bowel sounds present.   Central nervous system: Alert and awake.  Moves all 4 extremities.  Nonfocal.   Extremities: Anasarca present.  Involving lower extremities and upper extremities.   Skin: No rashes or lesions noted. Psychiatry: Mood is appropriate for condition  Data Reviewed: I have personally reviewed following labs and imaging studies  CBC: Recent Labs  Lab 11/16/21 1201 11/17/21 0521 11/18/21 0850 11/18/21 1928 11/19/21 0358 11/20/21 0800 11/21/21 0515  WBC 20.7*   < > 19.6* 18.3* 13.4* 8.1 8.4  NEUTROABS 17.7*  --   --   --   --   --  6.4  HGB 8.5*   < > 6.8* 8.0* 7.5* 8.0* 7.7*  HCT 26.9*   < > 21.7* 25.4* 23.7* 25.1* 24.4*  MCV 93.1   < > 93.1 92.4 93.7 91.3 92.1  PLT 314   < > 282 287 317 315 315   < > = values in this interval not displayed.  Basic Metabolic Panel: Recent Labs  Lab 11/16/21 0527 11/16/21 1201 11/17/21 0521 11/18/21 0158 11/18/21 1928 11/19/21 0358 11/20/21 0540 11/21/21 0515  NA 138   < > 135 135   --  136 135 136  136  K 3.2*   < > 3.6 3.6  --  3.5 3.0* 3.3*  3.2*  CL 106   < > 104 104  --  105 104 107  106  CO2 25   < > 24 24  --  23 21* 22  22  GLUCOSE 95   < > 126* 119*  --  73 85 89  89  BUN 17   < > 22 31*  --  33* 24* 26*  27*  CREATININE 1.07   < > 1.74* 2.56*  --  2.58* 1.67* 2.45*  2.48*  CALCIUM 7.4*   < > 7.4* 7.2*  --  7.0* 7.3* 7.2*  7.1*  MG 1.9  --  2.1 2.0  --   --  1.8 1.9  PHOS 3.3  --  3.0  --  4.5 4.7* 2.9 3.5   < > = values in this interval not displayed.   GFR: Estimated Creatinine Clearance: 26.3 mL/min (A) (by C-G formula based on SCr of 2.45 mg/dL (H)). Liver Function Tests: Recent Labs  Lab 11/17/21 0521 11/18/21 0158 11/19/21 0358 11/20/21 0540 11/21/21 0515  AST 34 30 26 26 27   ALT 19 17 15 16 16   ALKPHOS 96 84 74 78 67  BILITOT 0.8 0.7 0.7 0.7 0.7  PROT 5.5* 5.6* 4.8* 5.7* 5.2*  ALBUMIN 1.7* 1.8* 1.7* 1.6* 1.5*  <1.5*   Recent Labs  Lab 11/17/21 1020  LIPASE 53*   No results for input(s): AMMONIA in the last 168 hours. Coagulation Profile: Recent Labs  Lab 11/18/21 1928  INR 1.3*    Cardiac Enzymes: No results for input(s): CKTOTAL, CKMB, CKMBINDEX, TROPONINI in the last 168 hours. BNP (last 3 results) No results for input(s): PROBNP in the last 8760 hours. HbA1C: No results for input(s): HGBA1C in the last 72 hours. CBG: Recent Labs  Lab 11/20/21 1204 11/20/21 1702 11/20/21 2330 11/21/21 0533 11/21/21 1146  GLUCAP 103* 84 109* 94 98   Lipid Profile: Recent Labs    11/20/21 0540  TRIG 131   Thyroid Function Tests: Recent Labs    11/20/21 1320  TSH 1.262   Anemia Panel: No results for input(s): VITAMINB12, FOLATE, FERRITIN, TIBC, IRON, RETICCTPCT in the last 72 hours.  Sepsis Labs: Recent Labs  Lab 11/16/21 1201 11/17/21 0521 11/17/21 1020 11/18/21 0229  PROCALCITON  --  0.74  --   --   LATICACIDVEN 1.0  --  2.3* 1.0    Recent Results (from the past 240 hour(s))  Culture, blood (routine x  2)     Status: Abnormal (Preliminary result)   Collection Time: 11/17/21  5:10 AM   Specimen: BLOOD  Result Value Ref Range Status   Specimen Description   Final    BLOOD BLOOD LEFT HAND Performed at [facility], [place] [address] [place], [place] [address]    Special Requests   Final    BOTTLES DRAWN AEROBIC ONLY Blood Culture adequate volume Performed at [place] [address] [place], [place] [address]    Culture  Setup Time   Final    YEAST AEROBIC BOTTLE ONLY CRITICAL VALUE NOTED.  VALUE IS CONSISTENT WITH PREVIOUSLY REPORTED AND CALLED VALUE. Performed at [facility]  Lab, [address], [place], [place] [address]    Culture CANDIDA ALBICANS (A)  Final   Report Status PENDING  Incomplete  Culture, blood (routine x 2)     Status: Abnormal (Preliminary result)   Collection Time: 11/17/21  5:20 AM   Specimen: BLOOD  Result Value Ref Range Status   Specimen Description   Final    BLOOD BLOOD LEFT WRIST Performed at [place] [address], [place], [place] [address]    Special Requests   Final    BOTTLES DRAWN AEROBIC ONLY Blood Culture adequate volume Performed at [place] [address], [place], [place] [address]    Culture  Setup Time   Final    YEAST AEROBIC BOTTLE ONLY CRITICAL RESULT CALLED TO, READ BACK BY AND VERIFIED WITH: A PHAM PHARMD @0821  11/18/21 EB    Culture (A)  Final    CANDIDA ALBICANS Sent to [place] for further susceptibility testing. Performed at [facility], [place] [address] [place], [place] [address]    Report Status PENDING  Incomplete  Blood Culture ID Panel (Reflexed)     Status: Abnormal   Collection Time: 11/17/21  5:20 AM  Result Value Ref Range Status   Enterococcus faecalis NOT DETECTED NOT DETECTED Final   Enterococcus Faecium NOT DETECTED NOT DETECTED Final   Listeria monocytogenes NOT DETECTED NOT DETECTED Final   Staphylococcus  species NOT DETECTED NOT DETECTED Final   Staphylococcus aureus (BCID) NOT DETECTED NOT DETECTED Final   Staphylococcus epidermidis NOT DETECTED NOT DETECTED Final   Staphylococcus lugdunensis NOT DETECTED NOT DETECTED Final   Streptococcus species NOT DETECTED NOT DETECTED Final   Streptococcus agalactiae NOT DETECTED NOT DETECTED Final   Streptococcus pneumoniae NOT DETECTED NOT DETECTED Final   Streptococcus pyogenes NOT DETECTED NOT DETECTED Final   A.calcoaceticus-baumannii NOT DETECTED NOT DETECTED Final   Bacteroides fragilis NOT DETECTED NOT DETECTED Final   Enterobacterales NOT DETECTED NOT DETECTED Final   Enterobacter cloacae complex NOT DETECTED NOT DETECTED Final   Escherichia coli NOT DETECTED NOT DETECTED Final   Klebsiella aerogenes NOT DETECTED NOT DETECTED Final   Klebsiella oxytoca NOT DETECTED NOT DETECTED Final   Klebsiella pneumoniae NOT DETECTED NOT DETECTED Final   Proteus species NOT DETECTED NOT DETECTED Final   Salmonella species NOT DETECTED NOT DETECTED Final   Serratia marcescens NOT DETECTED NOT DETECTED Final   Haemophilus influenzae NOT DETECTED NOT DETECTED Final   Neisseria meningitidis NOT DETECTED NOT DETECTED Final   Pseudomonas aeruginosa NOT DETECTED NOT DETECTED Final   Stenotrophomonas maltophilia NOT DETECTED NOT DETECTED Final   Candida albicans DETECTED (A) NOT DETECTED Final    Comment: CRITICAL RESULT CALLED TO, READ BACK BY AND VERIFIED WITH: A PHAM PHARMD @0821  11/18/21 EB    Candida auris NOT DETECTED NOT DETECTED Final   Candida glabrata NOT DETECTED NOT DETECTED Final   Candida krusei NOT DETECTED NOT DETECTED Final   Candida parapsilosis NOT DETECTED NOT DETECTED Final   Candida tropicalis NOT DETECTED NOT DETECTED Final   Cryptococcus neoformans/gattii NOT DETECTED NOT DETECTED Final    Comment: Performed at [facility], [address], [place], [place] [address]  MRSA Next Gen by PCR, Nasal     Status: None   Collection  Time: 11/17/21  6:24 PM   Specimen: Nasal Mucosa; Nasal Swab  Result Value Ref Range Status   MRSA by PCR Next Gen NOT DETECTED NOT DETECTED Final  Comment: (NOTE) The GeneXpert MRSA Assay (FDA approved for NASAL specimens only), is one component of a comprehensive MRSA colonization surveillance program. It is not intended to diagnose MRSA infection nor to guide or monitor treatment for MRSA infections. Test performance is not FDA approved in patients less than 33 years old. Performed at [facility], [place], [place] [address], [place], [place] [address]   C Difficile Quick Screen (NO PCR Reflex)     Status: None   Collection Time: 11/17/21  8:40 PM   Specimen: STOOL  Result Value Ref Range Status   C Diff antigen NEGATIVE NEGATIVE Final   C Diff toxin NEGATIVE NEGATIVE Final   C Diff interpretation NEGATIVE  Final    Comment: Performed at [facility], [place] [address], [place], [place] [address]  Aerobic/Anaerobic Culture w Gram Stain (surgical/deep wound)     Status: None (Preliminary result)   Collection Time: 11/18/21 11:01 AM   Specimen: Abdomen; Abscess  Result Value Ref Range Status   Specimen Description   Final    ABDOMEN Performed at [place] [address], [place], [place] [address]    Special Requests   Final    ABSCES Performed at [facility], [place] [address], [place], [place] [address]    Gram Stain   Final    MODERATE WBC PRESENT,BOTH PMN AND MONONUCLEAR NO ORGANISMS SEEN    Culture   Final    CULTURE REINCUBATED FOR BETTER GROWTH Performed at [facility], [place] [address] [place], [place] [address]    Report Status PENDING  Incomplete  Culture, fungus without smear     Status: None (Preliminary result)   Collection Time: 11/18/21 11:02 AM   Specimen: Abscess; Other  Result Value Ref Range Status   Specimen Description   Final    ABSCESS Performed at [place] [address], [place], [place] [address]    Special Requests ABDOMEN  Final   Culture   Final    NO FUNGUS ISOLATED AFTER 2 DAYS Performed at [facility], [place] [address] [place], [place] [address]    Report Status PENDING  Incomplete  Urine Culture     Status: Abnormal   Collection Time: 11/18/21  3:03 PM   Specimen: Urine, Clean Catch  Result Value Ref Range Status   Specimen Description   Final    URINE, CLEAN CATCH Performed at [facility], [place] [address] [place], [place] [address]    Special Requests   Final    NONE Performed at [facility], [place] [address] [place], [place] [address]    Culture (A)  Final    <10,000 COLONIES/mL INSIGNIFICANT GROWTH Performed at [place] [address], [place], [place] [address]    Report Status 11/20/2021 FINAL  Final  Culture, blood (routine x 2)     Status: None (Preliminary result)   Collection Time: 11/18/21  7:27 PM   Specimen: BLOOD  Result Value Ref Range Status   Specimen Description   Final    BLOOD BLOOD LEFT WRIST Performed at [place] [address] [place], [place] [address]    Special Requests   Final    BOTTLES DRAWN AEROBIC ONLY Blood Culture adequate volume Performed at [place] [address], [place], [place] [address]    Culture   Final    NO GROWTH 2 DAYS Performed  at [facility], [place] [address] [place], [place] [address]    Report Status PENDING  Incomplete  Fungus culture, blood     Status: None (Preliminary result)   Collection Time: 11/18/21  7:28 PM   Specimen: BLOOD  Result Value Ref Range Status   Specimen Description   Final    BLOOD BLOOD LEFT HAND Performed at [place] [address] [place], [place] [address]    Special Requests   Final    BOTTLES DRAWN AEROBIC ONLY Blood Culture adequate volume Performed at [name] [address], [place], [place] [address]    Culture   Final    NO GROWTH 2 DAYS Performed at [place] [address] [place], [place] [address]    Report Status PENDING  Incomplete  Culture, blood (routine x 2)     Status: None (Preliminary result)   Collection Time: 11/18/21  7:28 PM   Specimen: BLOOD  Result Value Ref Range Status   Specimen Description   Final    BLOOD BLOOD LEFT HAND Performed at [place] [address] [place], [place] [address]    Special Requests   Final    BOTTLES DRAWN AEROBIC ONLY Blood Culture adequate volume Performed at [place] [address], [place], [place] [address]    Culture   Final    NO GROWTH 2 DAYS Performed at [place] [address], [place], [place] [address]    Report Status PENDING  Incomplete         Radiology Studies: No results found.      Scheduled Meds:  chlorhexidine  15 mL Mouth Rinse BID   Chlorhexidine Gluconate Cloth  6 each Topical Daily   feeding supplement  237 mL Oral TID BM   fluticasone  2 spray Each Nare Daily   heparin  5,000 Units Subcutaneous Q8H   insulin aspart  0-9 Units Subcutaneous Q6H   lip balm  1 application Topical BID   magic mouthwash w/lidocaine  10 mL Oral QID   mouth rinse  15 mL Mouth Rinse q12n4p   nystatin  5 mL Oral QID   nystatin   Topical BID   potassium chloride  40 mEq Oral BID   QUEtiapine  12.5 mg Oral QHS   sodium chloride flush  10-40 mL Intracatheter Q12H   sodium chloride flush  10-40 mL Intracatheter Q12H   sodium chloride flush  5 mL Intracatheter Q8H   traZODone  50 mg Oral QHS   umeclidinium bromide  1 puff Inhalation Daily   Continuous Infusions:  sodium chloride 75 mL/hr at 11/14/21 2328   sodium chloride 10 mL/hr at 11/21/21 1125   anidulafungin 100 mg (11/21/21 1126)   chlorproMAZINE (THORAZINE) IV Stopped (11/19/21 1550)   methocarbamol (ROBAXIN) IV Stopped (11/20/21 2252)    ondansetron (ZOFRAN) IV     piperacillin-tazobactam (ZOSYN)  IV Stopped (11/21/21 1223)     LOS: 12 days       Kayleen Memos, DO [facility] Available via Epic secure chat 7am-7pm After these hours, please refer to coverage provider listed on [email or web address] 11/21/2021, 1:30 PM

## 2021-11-21 NOTE — Progress Notes (Signed)
William [name] for Infectious Disease    Date of Admission:  11/07/2021     ID: William [name] is a 80 y.o. male with Bladder Cancer - s/p cystectomy with lysis of adhesions and small bowel resection 10/25/2021 for stage 2 bladder cancer with negative nodes and margins, Had neoadjuvant chemo prior s/p conduit diversion at cystectomy 09/2021.admitted with sepsis due to pyelonephritis with kleb bacteremia complicated by secondary fungemia from intra-abdominal abscess Principal Problem:   Septic shock ([place]) Active Problems:   Chronic pain   Bladder cancer ([place])   Bacteremia due to Gram-negative bacteria   UTI due to Klebsiella species    Subjective: Afebrile. Still feeling weak. Having less pain from lip lesion/starting to heal. Also is trying to improve oral intake  Labs reveal worsening creatinine  Medications:   (feeding supplement) PROSource Plus  30 mL Oral BID BM   chlorhexidine  15 mL Mouth Rinse BID   Chlorhexidine Gluconate Cloth  6 each Topical Daily   feeding supplement  237 mL Oral TID BM   fluticasone  2 spray Each Nare Daily   heparin  5,000 Units Subcutaneous Q8H   insulin aspart  0-9 Units Subcutaneous Q6H   lip balm  1 application Topical BID   magic mouthwash w/lidocaine  10 mL Oral QID   mouth rinse  15 mL Mouth Rinse q12n4p   multivitamin with minerals  1 tablet Oral Daily   nystatin  5 mL Oral QID   nystatin   Topical BID   potassium chloride  40 mEq Oral BID   QUEtiapine  12.5 mg Oral QHS   saccharomyces boulardii  250 mg Oral BID   sodium chloride flush  10-40 mL Intracatheter Q12H   sodium chloride flush  10-40 mL Intracatheter Q12H   sodium chloride flush  5 mL Intracatheter Q8H   traZODone  50 mg Oral QHS   umeclidinium bromide  1 puff Inhalation Daily    Objective: Vital signs in last 24 hours: Temp:  [97.9 F (36.6 C)-98.9 F (37.2 C)] 97.9 F (36.6 C) (11/22 1600) Pulse Rate:  [102-132] 103 (11/22 1600) Resp:  [12-23] 20 (11/22  1600) BP: (90-128)/(44-91) 110/63 (11/22 1600) SpO2:  [90 %-99 %] 98 % (11/22 1600) Weight:  [91.2 kg] 91.2 kg (11/22 0500)  Physical Exam  Constitutional: He is oriented to person, place, and time. He appears chronically ill and frail and malnourished. No distress.  HENT:  Mouth/Throat: Oropharynx is clear and moist. No oropharyngeal exudate.  Cardiovascular: Normal rate, regular rhythm and normal heart sounds. Exam reveals no gallop and no friction rub.  No murmur heard.  Pulmonary/Chest: Effort normal and breath sounds normal. No respiratory distress. He has no wheezes.  Abdominal: Soft. Bowel sounds are normal. He exhibits no distension. There is no tenderness. Urostomy on RLQ Lymphadenopathy:  He has no cervical adenopathy.  Neurological: He is alert and oriented to person, place, and time.  Skin: Skin is warm and dry. No rash noted. No erythema.  Psychiatric: He has a normal mood and affect. His behavior is normal.    Lab Results Recent Labs    11/20/21 0540 11/20/21 0800 11/21/21 0515  WBC  --  8.1 8.4  HGB  --  8.0* 7.7*  HCT  --  25.1* 24.4*  NA 135  --  136  136  K 3.0*  --  3.3*  3.2*  CL 104  --  107  106  CO2 21*  --  22  22  BUN 24*  --  26*  27*  CREATININE 1.67*  --  2.45*  2.48*   Liver Panel Recent Labs    11/20/21 0540 11/21/21 0515  PROT 5.7* 5.2*  ALBUMIN 1.6* 1.5*  <1.5*  AST 26 27  ALT 16 16  ALKPHOS 78 67  BILITOT 0.7 0.7   Sedimentation Rate No results for input(s): ESRSEDRATE in the last 72 hours. C-Reactive Protein No results for input(s): CRP in the last 72 hours.  Microbiology:  Studies/Results: No results found.   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 awaiting culture results, continues on piptazo  Kleb bacteremia = will continue on piptazo  Anemia = receiving 1 unit RBC  AKI,nonoliguric = nephrology following, hopefully to see improvement  with RBC transfusion and avoiding hypotension. Will dose adjust piptazo  [facility] for Infectious Diseases Pager: [phone]  11/21/2021, 4:14 PM

## 2021-11-21 NOTE — Progress Notes (Signed)
Referring Physician(s): Hall,C/[place],M  Supervising Physician: Daryll Brod  Patient Status:  William [name]  Chief Complaint:  Lower abdominal discomfort/hiccups  Subjective: Pt c/o some lower abdominal discomfort, hiccups; currently receiving blood transfusion- hgb 7.7 this am   Allergies: Hydrocodone  Medications: Prior to Admission medications   Medication Sig Start Date End Date Taking? Authorizing Provider  acetaminophen (TYLENOL) 500 MG tablet Take 1,000 mg by mouth every 4 (four) hours as needed for mild pain or fever.   Yes [provider]  fluticasone (FLONASE) 50 MCG/ACT nasal spray Place 2 sprays into both nostrils daily. 01/05/20  Yes [name], MD  gabapentin (NEURONTIN) 300 MG capsule TAKE 3 CAPSULES BY MOUTH AT BEDTIME Patient taking differently: Take 900 mg by mouth at bedtime. 03/09/21  Yes Patel, [name], DO  ibuprofen (ADVIL) 200 MG tablet Take 600 mg by mouth every 4 (four) hours as needed for mild pain.   Yes [provider]  ondansetron (ZOFRAN-ODT) 8 MG disintegrating tablet Take 8 mg by mouth every 8 (eight) hours as needed for nausea/vomiting. 11/06/21  Yes [provider]  oxyCODONE-acetaminophen (PERCOCET) 5-325 MG tablet Take 1 tablet by mouth every 6 (six) hours as needed for severe pain. 11/02/21 11/02/22 Yes [name], MD  SPIRIVA HANDIHALER 18 MCG inhalation capsule PLACE 1 CAPSULE( 18 MCG TOTAL) INTO INHALER AND INHALE DAILY Patient taking differently: Place 18 mcg into inhaler and inhale daily. 08/28/21  Yes [name], MD  tiZANidine (ZANAFLEX) 4 MG tablet Take 4 mg by mouth at bedtime.   Yes [provider]     Vital Signs: BP (!) 120/59   Pulse (!) 105   Temp 98.5 F (36.9 C) (Oral)   Resp 17   Ht 5\' 7"  (1.702 m)   Wt 201 lb 1 oz (91.2 kg)   SpO2 98%   BMI 31.49 kg/m   Physical Exam awake/answering questions ok; LLQ drain intact, insertion site ok, OP 250 cc yellow  fluid with tissue fragments  Imaging: CT IMAGE GUIDED DRAINAGE BY PERCUTANEOUS CATHETER  Result Date: 11/18/2021 INDICATION: 80 year old male with fluid collection lower abdomen referred for drainage EXAM: CT GUIDED DRAINAGE OF ABDOMINAL ABSCESS MEDICATIONS: The patient is currently admitted to the hospital and receiving intravenous antibiotics. The antibiotics were administered within an appropriate time frame prior to the initiation of the procedure. ANESTHESIA/SEDATION: 0 mg IV Versed 25 mcg IV Fentanyl Moderate Sedation Time:  0 moderate sedation not used. The patient was continuously monitored during the procedure by the interventional radiology nurse under my direct supervision. COMPLICATIONS: None TECHNIQUE: Informed written consent was obtained from the patient after a thorough discussion of the procedural risks, benefits and alternatives. All questions were addressed. Maximal Sterile Barrier Technique was utilized including caps, mask, sterile gowns, sterile gloves, sterile drape, hand hygiene and skin antiseptic. A timeout was performed prior to the initiation of the procedure. PROCEDURE: The left lower quadrant was prepped with chlorhexidine in a sterile fashion, and a sterile drape was applied covering the operative field. A sterile gown and sterile gloves were used for the procedure. Local anesthesia was provided with 1% Lidocaine. After scout CT was acquired, the patient is prepped and draped in the usual sterile fashion. 1% lidocaine was used for local anesthesia. Using CT guidance, modified Seldinger technique was used to place a 12 [place] drain into the fluid of the left lower quadrant. Sample sent for culture. Catheter was sutured in position and attached to gravity bag. Final  CT was acquired. Proximally 350 cc of fluid removed. Patient tolerated the procedure well and remained hemodynamically stable throughout. No complications were encountered and no significant blood loss. FINDINGS: Twelve  French drain into left lower quadrant fluid collection with near complete decompression. Approximally 3 in 50 cc of thin turbid yellow fluid IMPRESSION: Status post CT-guided drainage of left lower quadrant fluid collection. Signed, Dulcy Fanny. Dellia Nims, RPVI [facility] Radiology Electronically Signed   By: Corrie Mckusick D.O.   On: 11/18/2021 12:20    Labs:  CBC: Recent Labs    11/18/21 1928 11/19/21 0358 11/20/21 0800 11/21/21 0515  WBC 18.3* 13.4* 8.1 8.4  HGB 8.0* 7.5* 8.0* 7.7*  HCT 25.4* 23.7* 25.1* 24.4*  PLT 287 317 315 315    COAGS: Recent Labs    06/29/21 0830 11/16/2021 1016 11/10/21 0500 11/18/21 1928  INR 1.0 1.4* 1.5* 1.3*  APTT  --  38*  --   --     BMP: Recent Labs    11/18/21 0158 11/19/21 0358 11/20/21 0540 11/21/21 0515  NA 135 136 135 136  136  K 3.6 3.5 3.0* 3.3*  3.2*  CL 104 105 104 107  106  CO2 24 23 21* 22  22  GLUCOSE 119* 73 85 89  89  BUN 31* 33* 24* 26*  27*  CALCIUM 7.2* 7.0* 7.3* 7.2*  7.1*  CREATININE 2.56* 2.58* 1.67* 2.45*  2.48*  GFRNONAA 25* 25* 41* 26*  26*    LIVER FUNCTION TESTS: Recent Labs    11/18/21 0158 11/19/21 0358 11/20/21 0540 11/21/21 0515  BILITOT 0.7 0.7 0.7 0.7  AST 30 26 26 27   ALT 17 15 16 16   ALKPHOS 84 74 78 67  PROT 5.6* 4.8* 5.7* 5.2*  ALBUMIN 1.8* 1.7* 1.6* 1.5*  <1.5*    Assessment and Plan: Pt with hx bladder ca with prior cystectomy/conduit diversion/stenting/prostatectomy, SBR 10/25/21; s/p drainage of LLQ abd fluid collection 11/18/21; afebrile; fluid creat pending (r/o urine leak); WBC nl; hgb 7.7(8.0), creat 2.48(1.67)- neph following as well; drain fluid cx pend; cont current tx; obtain f/u imaging once drain OP minimal or if WBC rises sig   Electronically Signed: D. Rowe Robert, PA-C 11/21/2021, 12:57 PM   I spent a total of 15 Minutes at the the patient's bedside AND on the patient's hospital floor or unit, greater than  50% of which was counseling/coordinating care for left lower abdominal fluid collection drain    Patient ID: William [name], male   DOB: 11/23/1941, 80 y.o.   MRN: [id number]

## 2021-11-21 NOTE — Progress Notes (Signed)
Subjective/Chief Complaint:  1 - Bladder Cancer - s/p cystectomy with lysis of adhesions and small bowel resection 10/25/2021 for stage 2 bladder cancer with negative nodes and margins, Had neoadjuvant chemo prior.    2 - Urinary Diversion - s/p conduit diversion at cystectomy 09/2021. Stents in good position by ER CT this admission.    3 - Sepsis, Pyelonephritis, Abdominal Abscess, R/O CDiff- tachycardia, chills, lactic acidosis c/w major sepsis at ER eval 11/10. CT with expected subcutaneous gas (from insuflattion at procedure), some pelvic fluid. NO significant inraperitoneal air, stents in good position. Some dilated small bowel. UCX and BCX c/w coliform pyelo and placed on therapy with improvement. Klebsiella and Enterococcus.  Repeat CT 11/18 with pelvic fluic collection, no bowel obstruction, has been having freqeunt loose stools and worsening leukocytosis as well as abdominal pain. CDiff negative 11/18. Abscess drain placed 11/19 and non-foul fluid obtained, CX NGTD, Cr pending.    Today "Samy" is seen  in f/u above. Infectious parameters continue to improve with abdominal drain and output decreasing. Receiving blood for some anemia in setting of renal dysfunction. Had some solid foods today.      Objective: Vital signs in last 24 hours: Temp:  [97.9 F (36.6 C)-98.9 F (37.2 C)] 97.9 F (36.6 C) (11/22 1600) Pulse Rate:  [102-132] 103 (11/22 1600) Resp:  [12-23] 20 (11/22 1600) BP: (90-128)/(44-91) 110/63 (11/22 1600) SpO2:  [90 %-99 %] 98 % (11/22 1600) Weight:  [91.2 kg] 91.2 kg (11/22 0500) Last BM Date: 11/21/21  Intake/Output from previous day: 11/21 0701 - 11/22 0700 In: 495.3 [P.O.:170; I.V.:25; IV Piggyback:295.3] Out: 1750 [Urine:1100; Drains:250; Stool:400] Intake/Output this shift: Total I/O In: 672.3 [I.V.:141.9; Blood:354.7; IV Piggyback:175.8] Out: 1250 [Urine:975; Drains:100; Stool:175]   Ill appearing. Some hiccups again. Somewhat improved  anasarca. [place] O2 Regular tachycardia low 1000's by monitor. Reciving pRBC.  Non-distended abdomen. NO rebound / guarding RLQ urostomy pink / patent with distal bilateral stents in place and non-foul urine LLQ abdominal drain with non-foul serous output that is straw colored.  Edematous scrotum and legs c/w overall edema  Lab Results:  Recent Labs    11/20/21 0800 11/21/21 0515  WBC 8.1 8.4  HGB 8.0* 7.7*  HCT 25.1* 24.4*  PLT 315 315   BMET Recent Labs    11/20/21 0540 11/21/21 0515  NA 135 136  136  K 3.0* 3.3*  3.2*  CL 104 107  106  CO2 21* 22  22  GLUCOSE 85 89  89  BUN 24* 26*  27*  CREATININE 1.67* 2.45*  2.48*  CALCIUM 7.3* 7.2*  7.1*   PT/INR Recent Labs    11/18/21 1928  LABPROT 16.2*  INR 1.3*   ABG No results for input(s): PHART, HCO3 in the last 72 hours.  Invalid input(s): PCO2, PO2  Studies/Results: No results found.  Anti-infectives: Anti-infectives (From admission, onward)    Start     Dose/Rate Route Frequency Ordered Stop   11/20/21 1000  anidulafungin (ERAXIS) 100 mg in sodium chloride 0.9 % 100 mL IVPB        100 mg 78 mL/hr over 100 Minutes Intravenous Every 24 hours 11/19/21 0729     11/19/21 1200  fluconazole (DIFLUCAN) IVPB 200 mg  Status:  Discontinued        200 mg 100 mL/hr over 60 Minutes Intravenous Every 24 hours 11/18/21 0852 11/19/21 0729   11/19/21 0900  anidulafungin (ERAXIS) 200 mg in sodium chloride 0.9 % 200 mL IVPB  200 mg 78 mL/hr over 200 Minutes Intravenous  Once 11/19/21 0729 11/19/21 1220   11/18/21 0945  fluconazole (DIFLUCAN) IVPB 800 mg        800 mg 200 mL/hr over 120 Minutes Intravenous  Once 11/18/21 0852 11/18/21 1204   11/17/21 1600  piperacillin-tazobactam (ZOSYN) IVPB 3.375 g        3.375 g 12.5 mL/hr over 240 Minutes Intravenous Every 8 hours 11/17/21 1333     11/12/21 0800  Ampicillin-Sulbactam (UNASYN) 3 g in sodium chloride 0.9 % 100 mL IVPB  Status:  Discontinued        3 g 200  mL/hr over 30 Minutes Intravenous Every 6 hours 11/12/21 0712 11/17/21 1333   11/10/21 1400  cefTRIAXone (ROCEPHIN) 2 g in sodium chloride 0.9 % 100 mL IVPB  Status:  Discontinued        2 g 200 mL/hr over 30 Minutes Intravenous Daily 11/10/21 1048 11/12/21 0712   11/10/21 0800  ceFEPIme (MAXIPIME) 2 g in sodium chloride 0.9 % 100 mL IVPB  Status:  Discontinued        2 g 200 mL/hr over 30 Minutes Intravenous Every 12 hours 11/10/21 0618 11/10/21 1048   11/10/21 0630  ceFEPIme (MAXIPIME) 2 g in sodium chloride 0.9 % 100 mL IVPB  Status:  Discontinued        2 g 200 mL/hr over 30 Minutes Intravenous Every 24 hours 11/21/2021 1622 11/10/21 0618   11/18/2021 1624  vancomycin variable dose per unstable renal function (pharmacist dosing)  Status:  Discontinued         Does not apply See admin instructions 11/03/2021 1624 11/10/21 0613   11/22/2021 1030  ceFEPIme (MAXIPIME) 2 g in sodium chloride 0.9 % 100 mL IVPB        2 g 200 mL/hr over 30 Minutes Intravenous  Once 11/22/2021 1016 11/06/2021 1133   11/24/2021 1030  metroNIDAZOLE (FLAGYL) IVPB 500 mg        500 mg 100 mL/hr over 60 Minutes Intravenous  Once 11/01/2021 1016 11/13/2021 1915   11/14/2021 1030  vancomycin (VANCOCIN) IVPB 1000 mg/200 mL premix  Status:  Discontinued        1,000 mg 200 mL/hr over 60 Minutes Intravenous  Once 11/14/2021 1016 11/26/2021 1023   11/10/2021 1030  vancomycin (VANCOREADY) IVPB 1500 mg/300 mL        1,500 mg 150 mL/hr over 120 Minutes Intravenous  Once 11/08/2021 1023 11/11/2021 1341       Assessment/Plan:   1 - Bladder Cancer - good prognosis. No further cancer-directed therapy.    2 - Urinary Diversion - working appropriately. Stents in good position   3 - Sepsis, Pyelonephritis, Abdominal Abscess R/O CDiff- Improving s/p abd drain suggests this was second primary infectious site in addition to pyelo. Thankful no signs of bowel leak. Drain Cr drawn and pending to max r/o urine leak.  Continue to greatly appreciate medical  team, IR, nephrol and gen surg comanagement with this complex patient.    [name] 11/21/2021

## 2021-11-21 NOTE — Consult Note (Signed)
[facility] Nurse ostomy follow up Patient receiving care in [facility] ICU 1239. Spouse in room. Stoma type/location: RUQ ileal conduit Stomal assessment/size: stoma, moist, pale, stents present bilaterally. Peristomal assessment: deferred. Pouch change date 11/20. Spouse tells me she has no questions about ostomy care or pouch change process, she knows how to perform the tasks.  She does not change the pouch while the patient is in hospital, the unit staff performed on Sunday. Today the pouching systems does not need changing. Treatment options for stomal/peristomal skin: convex pouch and barrier ring Output: urine Ostomy pouching: 1pc. Roselee Culver [phone] and barrier ring, Kellie Simmering [phone].  The [place] is ordering 6 of each for placement in the patient's room Education provided: none needed. I explained to the patient, his spouse, and bedside nurse that the [facility] visits will be weekly and the ICU nursing staff are able to change the pouch twice weekly. Today the patient has anasarca and is very weak. Enrolled patient in [facility] Discharge program: Yes, previously. Val Riles, RN, MSN, CWOCN, CNS-BC, pager [phone]

## 2021-11-21 NOTE — Progress Notes (Signed)
Nutrition Follow-up  DOCUMENTATION CODES:   Not applicable  INTERVENTION:  - continue Ensure Enlive TID, each supplement provides 350 kcal and 20 grams of protein. - will order 30 ml Prosource Plus BID, each supplement provides 100 kcal and 15 grams protein.  - will order 1 tablet multivitamin with minerals/day.  - nurse or tech to provide feeding assistance at all meals.   NUTRITION DIAGNOSIS:   Inadequate oral intake related to decreased appetite as evidenced by per patient/family report.  GOAL:   Patient will meet greater than or equal to 90% of their needs  MONITOR:   PO intake, Supplement acceptance, Labs, Weight trends  REASON FOR ASSESSMENT:   Consult New TPN/TNA  ASSESSMENT:   80 year-old male with medical history of dyslipidemia, COVID-19, bladder cancer s/p laparoscopic radical cystectomy, robotic radical prostatectomy, bilateral pelvic lymph node dissection, extensive LOAs, SBR, and ileal conduit urinary diversion on 10/26 in the setting of invasive bladder cancer, on chemo. He was seen in the Urology Clinic on 11/10 and found to have BP in the 74Q systolic.  Patient sitting up in bed with no visitors present at the time of RD visit. Patient has been off TPN since 11/19 due to candidemia. Diet advanced to Dysphagia 3, thin liquids from FLD yesterday at 1306. No documented meal completion percentages since that time.  Patient's breakfast tray was still in the room and was untouched. He shared that he requires feeding assistance at all meals and that he has not been receiving the assistance that he needs.   Ensure was ordered TID on 11/17 and he has been accepting this supplement 75-90% of the time offered, but does state if he does not receive assistance with this he is also unable to drink it.   Weight has been fluctuating nearly daily over the past week. He is noted to have deep pitting edema to all extremities and very deep pitting edema to perineal and sacral  areas.    Labs reviewed; CBGs: 94 and 98 mg/dl, K: 3.3 mmol/l, BUN: 26 mg/dl, creatinine: 2.45 mg/dl, Ca: 7.2 mg/dl, GFR: 26 ml/min.   Medications reviewed; sliding scale novolog, 5 ml mycostatin QID, 40 mEq Klro-Con BID, 250 mg florastor BID.   Diet Order:   Diet Order             DIET DYS 3 Room service appropriate? Yes; Fluid consistency: Thin  Diet effective now                   EDUCATION NEEDS:   Education needs have been addressed  Skin:  Skin Assessment: Skin Integrity Issues: Skin Integrity Issues:: Incisions Incisions: abdomen (10/26)  Last BM:  11/22 (type 7, medium amount)  Height:   Ht Readings from Last 1 Encounters:  11/17/21 5\' 7"  (1.702 m)    Weight:   Wt Readings from Last 1 Encounters:  11/21/21 91.2 kg     Estimated Nutritional Needs:  Kcal:  2030-2275 kcal Protein:  100-115 grams Fluid:  >/= 2.2 L/day     Jarome Matin, MS, RD, LDN, CNSC Inpatient Clinical Dietitian RD pager # available in [place]  After hours/weekend pager # available in [facility]

## 2021-11-21 NOTE — Progress Notes (Signed)
[facility] Progress Note  Subjective: noticed that crt jumped up quite a bit today so will get back on the case-  1100 UOP -   less than previously.  No major events but BP is soft -  hgb 7.7-  is going to get blood  Vitals:   11/21/21 0826 11/21/21 0900 11/21/21 1000 11/21/21 1100  BP:  (!) 115/49 108/66 109/63  Pulse:  (!) 116 (!) 132 (!) 118  Resp:  (!) 21 (!) 23 18  Temp:      TempSrc:      SpO2: 97% 94% 93% 98%  Weight:      Height:        Exam: Gen more alert, has hiccups No jvd or bruits Chest clear bilat to bases, no rales/ wheezing RRR no MRG Abd soft ntnd no mass or ascites +bs Ext diffuse 2+ bilat UE and LE edema Neuro is lethargic, nonfocal and responds appropriately, O x 3   Home meds include - advil, neurontin, percocet prn, zanaflex, spiriva, flonase, prns     Baseline creat from this admission is 0.82- 1.07, eGFR > 60 ml/min.     UA 11/10 - cloudy amber, large H/ LE, 100 prot, many bact, >50 rbc/ wbc   UNa 43, UCr 48     CT kidneys > Adrenals/Urinary Tract: Adrenal glands are unremarkable. Status post cystoprostatectomy and right lower quadrant ileal conduit urinary diversion. Mild bilateral hydronephrosis. Bilateral double-J ureteral stents in position. The left renal pelvic pigtail is incompletely formed (series 2, image 76).    CXR  11/18 - Mild right lower lobe airspace disease unchanged. Improvement in left lower lobe airspace disease and small left effusion. Negative for pulmonary edema    Assessment/ Plan: AKI - b/l creat 0.82- 1.07, eGFR > 60 ml/min from this admission. SP cystectomy/ ileal conduit surgery on 10/26/21. Then admitted here 11/10 w/ urosepsis d/t Klebsiella. Creat was 3 on admit 11/10, improved to 0.8 and then increased up to 1.7 on 11/18. UA from 11/10 and 11/18 c/w infection. Urine lytes c/w ATN or euvolemia. Imaging showed chronic bilat hydro and bilat ureteral stents, cystoprostatectomy and ileal conduit. Nonoliguric AKI  likely due to sepsis/ abscess.   Abdominal abscess drained per IR 11/19. Creat improved quite a bit on 11/21 and UOP very good, felt we were in the home stretch as far as his AKI but crt went up again today. No indication for RRT at this time. Cont rx for sepsis/ infection. Will follow. Possibly BP got a bit too low  over last 24 hours ?   Klebsiella/ candida bacteremia - getting IV zosyn and eraxis now.  Vol overload - feel like he will autodiurese as his kidney function improves-  but could use lasix if felt needed -- not now because do not want to lower pressure or K  SP recent cystectomy w/ ileal conduit - on 10/27, for bladder cancer Nutrition - getting TPN Hypokalemia - getting replaced-  40 BID ordered for today- K of 3.2.-  also adjust in TPN Anemia-  getting transfused today -  should help BP and K       Galilea Quito A Keigo Whalley  11/21/2021, 12:03 PM   Recent Labs  Lab 11/20/21 0540 11/20/21 0800 11/21/21 0515  K 3.0*  --  3.3*  3.2*  BUN 24*  --  26*  27*  CREATININE 1.67*  --  2.45*  2.48*  CALCIUM 7.3*  --  7.2*  7.1*  PHOS 2.9  --  3.5  HGB  --  8.0* 7.7*   Inpatient medications:  sodium chloride   Intravenous Once   chlorhexidine  15 mL Mouth Rinse BID   Chlorhexidine Gluconate Cloth  6 each Topical Daily   feeding supplement  237 mL Oral TID BM   fluticasone  2 spray Each Nare Daily   heparin  5,000 Units Subcutaneous Q8H   insulin aspart  0-9 Units Subcutaneous Q6H   lip balm  1 application Topical BID   magic mouthwash w/lidocaine  10 mL Oral QID   mouth rinse  15 mL Mouth Rinse q12n4p   nystatin  5 mL Oral QID   nystatin   Topical BID   potassium chloride  40 mEq Oral BID   QUEtiapine  12.5 mg Oral QHS   sodium chloride flush  10-40 mL Intracatheter Q12H   sodium chloride flush  10-40 mL Intracatheter Q12H   sodium chloride flush  5 mL Intracatheter Q8H   traZODone  50 mg Oral QHS   umeclidinium bromide  1 puff Inhalation Daily    sodium chloride 75  mL/hr at 11/14/21 2328   sodium chloride 10 mL/hr at 11/21/21 1125   anidulafungin 100 mg (11/21/21 1126)   chlorproMAZINE (THORAZINE) IV Stopped (11/19/21 1550)   methocarbamol (ROBAXIN) IV Stopped (11/20/21 2252)   ondansetron (ZOFRAN) IV     piperacillin-tazobactam (ZOSYN)  IV 12.5 mL/hr at 11/21/21 1100   sodium chloride, acetaminophen, chlorproMAZINE (THORAZINE) IV, diphenhydrAMINE, HYDROmorphone (DILAUDID) injection, magic mouthwash, menthol-cetylpyridinium, methocarbamol (ROBAXIN) IV, ondansetron (ZOFRAN) IV **OR** ondansetron (ZOFRAN) IV, phenol, sodium chloride flush, sodium chloride flush

## 2021-11-22 ENCOUNTER — Inpatient Hospital Stay: Payer: Self-pay

## 2021-11-22 DIAGNOSIS — R6521 Severe sepsis with septic shock: Secondary | ICD-10-CM | POA: Diagnosis not present

## 2021-11-22 DIAGNOSIS — A419 Sepsis, unspecified organism: Secondary | ICD-10-CM | POA: Diagnosis not present

## 2021-11-22 LAB — COMPREHENSIVE METABOLIC PANEL
ALT: 15 U/L (ref 0–44)
AST: 26 U/L (ref 15–41)
Albumin: <1.5 g/dL — ABNORMAL LOW (ref 3.5–5.0)
Alkaline Phosphatase: 83 U/L (ref 38–126)
Anion gap: 7 (ref 5–15)
BUN: 23 mg/dL (ref 8–23)
CO2: 21 mmol/L — ABNORMAL LOW (ref 22–32)
Calcium: 7.4 mg/dL — ABNORMAL LOW (ref 8.9–10.3)
Chloride: 109 mmol/L (ref 98–111)
Creatinine, Ser: 1.84 mg/dL — ABNORMAL HIGH (ref 0.61–1.24)
GFR, Estimated: 37 mL/min — ABNORMAL LOW (ref >60)
Glucose, Bld: 92 mg/dL (ref 70–99)
Potassium: 3.2 mmol/L — ABNORMAL LOW (ref 3.5–5.1)
Sodium: 137 mmol/L (ref 135–145)
Total Bilirubin: 1 mg/dL (ref 0.3–1.2)
Total Protein: 5.5 g/dL — ABNORMAL LOW (ref 6.5–8.1)

## 2021-11-22 LAB — GLUCOSE, CAPILLARY
Glucose-Capillary: 93 mg/dL (ref 70–99)
Glucose-Capillary: 80 mg/dL (ref 70–99)
Glucose-Capillary: 93 mg/dL (ref 70–99)
Glucose-Capillary: 86 mg/dL (ref 70–99)
Glucose-Capillary: 83 mg/dL (ref 70–99)

## 2021-11-22 LAB — TYPE AND SCREEN
ABO/RH(D): O POS
Antibody Screen: NEGATIVE
Unit division: 0
Unit division: 0
Unit division: 0

## 2021-11-22 LAB — CBC WITH DIFFERENTIAL/PLATELET
Abs Immature Granulocytes: 0.18 10*3/uL — ABNORMAL HIGH (ref 0.00–0.07)
Basophils Absolute: 0.1 10*3/uL (ref 0.0–0.1)
Basophils Relative: 1 %
Eosinophils Absolute: 0.1 10*3/uL (ref 0.0–0.5)
Eosinophils Relative: 1 %
HCT: 29.7 % — ABNORMAL LOW (ref 39.0–52.0)
Hemoglobin: 9.7 g/dL — ABNORMAL LOW (ref 13.0–17.0)
Immature Granulocytes: 2 %
Lymphocytes Relative: 11 %
Lymphs Abs: 1 10*3/uL (ref 0.7–4.0)
MCH: 29.7 pg (ref 26.0–34.0)
MCHC: 32.7 g/dL (ref 30.0–36.0)
MCV: 90.8 fL (ref 80.0–100.0)
Monocytes Absolute: 0.6 10*3/uL (ref 0.1–1.0)
Monocytes Relative: 6 %
Neutro Abs: 7.3 10*3/uL (ref 1.7–7.7)
Neutrophils Relative %: 79 %
Platelets: 308 10*3/uL (ref 150–400)
RBC: 3.27 MIL/uL — ABNORMAL LOW (ref 4.22–5.81)
RDW: 15.6 % — ABNORMAL HIGH (ref 11.5–15.5)
WBC: 9.2 10*3/uL (ref 4.0–10.5)
nRBC: 0 % (ref 0.0–0.2)

## 2021-11-22 LAB — BPAM RBC
Blood Product Expiration Date: 202212142359
Blood Product Expiration Date: 202212212359
Blood Product Expiration Date: 202212212359
ISSUE DATE / TIME: 202211191324
ISSUE DATE / TIME: 202211221214
ISSUE DATE / TIME: 202211221455
Unit Type and Rh: 5100
Unit Type and Rh: 5100
Unit Type and Rh: 5100

## 2021-11-22 LAB — FUNGAL STAIN REFLEX

## 2021-11-22 LAB — FUNGUS STAIN

## 2021-11-22 MED ORDER — POTASSIUM CHLORIDE 10 MEQ/100ML IV SOLN
10.0000 meq | INTRAVENOUS | Status: AC
  Administered 2021-11-22 (×3): 10 meq via INTRAVENOUS
  Filled 2021-11-22 (×2): qty 100

## 2021-11-22 MED ORDER — SENNOSIDES-DOCUSATE SODIUM 8.6-50 MG PO TABS: 1.0000 | ORAL_TABLET | Freq: Every evening | ORAL | Status: DC | PRN

## 2021-11-22 MED ORDER — GUAIFENESIN 100 MG/5ML PO LIQD: 5.0000 mL | ORAL | Status: DC | PRN

## 2021-11-22 MED ORDER — POTASSIUM CHLORIDE 10 MEQ/100ML IV SOLN
10.0000 meq | INTRAVENOUS | Status: AC
  Administered 2021-11-22 (×4): 10 meq via INTRAVENOUS
  Filled 2021-11-22: qty 100

## 2021-11-22 MED ORDER — METOPROLOL TARTRATE 5 MG/5ML IV SOLN
5.0000 mg | INTRAVENOUS | Status: DC | PRN
  Administered 2021-11-24: 5 mg via INTRAVENOUS
  Filled 2021-11-22: qty 5

## 2021-11-22 MED ORDER — ALBUMIN HUMAN 25 % IV SOLN
25.0000 g | Freq: Four times a day (QID) | INTRAVENOUS | Status: AC
  Administered 2021-11-22 – 2021-11-23 (×7): 25 g via INTRAVENOUS
  Filled 2021-11-22 (×6): qty 100

## 2021-11-22 MED ORDER — HYDRALAZINE HCL 20 MG/ML IJ SOLN: 10.0000 mg | INTRAMUSCULAR | Status: DC | PRN

## 2021-11-22 NOTE — Progress Notes (Signed)
Pharmacy Antibiotic Note  William [name] is a 80 y.o. male admitted on 11/26/2021 with sepsis.  Pharmacy was originally consulted for vancomycin and cefepime dosing > Ceftriaxone with Kleb pneumo BCID > Unasyn with addition of E faecalis to urine culture. Abx change from Unasyn to Zosyn. Now with latest BCx showing candida albicans. ID consulted and is working up.  Day 6 Zosyn D4 Eraxis Tmax 100.8 on 11/22 WBC WNL stable SCr improved LFTs WNL Final Cx's still pending  Plan: Continue anidulafungin 100mg  IV daily per ID Continue Zosyn 3.375gm q8, 4 hr infusion  Height: 5\' 7"  (170.2 cm) Weight: 91.3 kg (201 lb 4.5 oz) IBW/kg (Calculated) : 66.1  Temp (24hrs), Avg:98.5 F (36.9 C), Min:97.4 F (36.3 C), Max:100.8 F (38.2 C)  Recent Labs  Lab 11/16/21 1201 11/17/21 0521 11/17/21 1020 11/18/21 0158 11/18/21 0229 11/18/21 0850 11/19/21 0358 11/20/21 0540 11/20/21 0800 11/21/21 0515 11/21/21 1955 11/22/21 0500  WBC 20.7*   < >  --   --   --    < > 13.4*  --  8.1 8.4 9.3 9.2  CREATININE 1.00   < >  --  2.56*  --   --  2.58* 1.67*  --  2.45*  2.48*  --  1.84*  LATICACIDVEN 1.0  --  2.3*  --  1.0  --   --   --   --   --   --   --    < > = values in this interval not displayed.     Estimated Creatinine Clearance: 35.1 mL/min (A) (by C-G formula based on SCr of 1.84 mg/dL (H)).    Allergies  Allergen Reactions   Hydrocodone Other (See Comments)    Causes constipation even with stool softner    Antimicrobials this admission: 11/10 Vanc/Flagyl x1 11/10 Cefepime >> 11/11 11/11 CTX >> 11/12 11/13 Unasyn >> 11/18 11/18 Zosy >> 11/19 Fluconazole >> 11/19 11/20 Eraxis >>  Dose adjustments this admission:  Microbiology results: 11/10 BCx: 3/3 Klebsiella oxytoca (R amp) 11/10 UCx:  Klebsiella oxytoca (R amp), E. Faecalis (pan-sensitive) 11/18 rpt BCx: 2/4 candida albicans - drug panel pending 11/19 abdomen abscess: re-incubated 11/19 UCx: insig growth 11/19 repeat  Bcx: ngtd 11/19 fungus Bcx: ngtd  Thank you for allowing pharmacy to be a part of this patient's care.  Adrian Saran, PharmD, BCPS Secure Chat if ?s 11/22/2021 10:00 AM

## 2021-11-22 NOTE — Progress Notes (Signed)
Subjective/Chief Complaint:  1 - Bladder Cancer - s/p cystectomy with lysis of adhesions and small bowel resection 10/25/2021 for stage 2 bladder cancer with negative nodes and margins, Had neoadjuvant chemo prior.    2 - Urinary Diversion - s/p conduit diversion at cystectomy 09/2021. Stents in good position by ER CT this admission.    3 - Sepsis, Pyelonephritis, Abdominal Abscess - tachycardia, chills, lactic acidosis c/w major sepsis at ER eval 11/10. CT with expected subcutaneous gas (from insuflattion at procedure), some pelvic fluid. NO significant inraperitoneal air, stents in good position. Some dilated small bowel. UCX and BCX c/w coliform pyelo and placed on therapy with improvement. Klebsiella and Enterococcus.  Repeat CT 11/18 with pelvic fluic collection, no bowel obstruction, has been having freqeunt loose stools and worsening leukocytosis as well as abdominal pain. CDiff negative 11/18. Abscess drain placed 11/19 and non-foul fluid obtained, CX candida and placed on antifungals per ID recs, Cr pending.    Today "William [name]" is stable, but remains critically ill. Now on antifungal per ID (greatly appreciate). Cr from abd fluid still pending. UOP increasing with albumin and RBC's.    Objective: Vital signs in last 24 hours: Temp:  [97.4 F (36.3 C)-100.8 F (38.2 C)] 99.9 F (37.7 C) (11/23 1200) Pulse Rate:  [102-123] 111 (11/23 1500) Resp:  [14-24] 19 (11/23 1500) BP: (103-134)/(51-77) 127/74 (11/23 1500) SpO2:  [91 %-98 %] 95 % (11/23 1500) Weight:  [91.3 kg] 91.3 kg (11/23 0500) Last BM Date: 11/22/21  Intake/Output from previous day: 11/22 0701 - 11/23 0700 In: 1657.6 [I.V.:508.4; Blood:781.3; IV Piggyback:368] Out: 2650 [JQBHA:1937; Drains:100; Stool:575] Intake/Output this shift: Total I/O In: 568.9 [I.V.:40; IV Piggyback:528.9] Out: 1000 [Urine:1000]   Ill appearing, stable. Stable anasarca. [place] O2 Regular tachycardia low 100's by monitor.   Non-distended  abdomen. NO rebound / guarding RLQ urostomy pink / patent with distal bilateral stents in place and non-foul urine LLQ abdominal drain with non-foul serous output that is straw colored.  Edematous scrotum and legs c/w overall edema  Lab Results:  Recent Labs    11/21/21 1955 11/22/21 0500  WBC 9.3 9.2  HGB 9.7* 9.7*  HCT 30.0* 29.7*  PLT 315 308   BMET Recent Labs    11/21/21 0515 11/22/21 0500  NA 136  136 137  K 3.3*  3.2* 3.2*  CL 107  106 109  CO2 22  22 21*  GLUCOSE 89  89 92  BUN 26*  27* 23  CREATININE 2.45*  2.48* 1.84*  CALCIUM 7.2*  7.1* 7.4*   PT/INR No results for input(s): LABPROT, INR in the last 72 hours. ABG No results for input(s): PHART, HCO3 in the last 72 hours.  Invalid input(s): PCO2, PO2  Studies/Results: [place] EKG SITE RITE  Result Date: 11/22/2021 If Site Rite image not attached, placement could not be confirmed due to current cardiac rhythm.   Anti-infectives: Anti-infectives (From admission, onward)    Start     Dose/Rate Route Frequency Ordered Stop   11/20/21 1000  anidulafungin (ERAXIS) 100 mg in sodium chloride 0.9 % 100 mL IVPB        100 mg 78 mL/hr over 100 Minutes Intravenous Every 24 hours 11/19/21 0729     11/19/21 1200  fluconazole (DIFLUCAN) IVPB 200 mg  Status:  Discontinued        200 mg 100 mL/hr over 60 Minutes Intravenous Every 24 hours 11/18/21 0852 11/19/21 0729   11/19/21 0900  anidulafungin (ERAXIS) 200 mg in  sodium chloride 0.9 % 200 mL IVPB        200 mg 78 mL/hr over 200 Minutes Intravenous  Once 11/19/21 0729 11/19/21 1220   11/18/21 0945  fluconazole (DIFLUCAN) IVPB 800 mg        800 mg 200 mL/hr over 120 Minutes Intravenous  Once 11/18/21 0852 11/18/21 1204   11/17/21 1600  piperacillin-tazobactam (ZOSYN) IVPB 3.375 g        3.375 g 12.5 mL/hr over 240 Minutes Intravenous Every 8 hours 11/17/21 1333     11/12/21 0800  Ampicillin-Sulbactam (UNASYN) 3 g in sodium chloride 0.9 % 100 mL IVPB  Status:   Discontinued        3 g 200 mL/hr over 30 Minutes Intravenous Every 6 hours 11/12/21 0712 11/17/21 1333   11/10/21 1400  cefTRIAXone (ROCEPHIN) 2 g in sodium chloride 0.9 % 100 mL IVPB  Status:  Discontinued        2 g 200 mL/hr over 30 Minutes Intravenous Daily 11/10/21 1048 11/12/21 0712   11/10/21 0800  ceFEPIme (MAXIPIME) 2 g in sodium chloride 0.9 % 100 mL IVPB  Status:  Discontinued        2 g 200 mL/hr over 30 Minutes Intravenous Every 12 hours 11/10/21 0618 11/10/21 1048   11/10/21 0630  ceFEPIme (MAXIPIME) 2 g in sodium chloride 0.9 % 100 mL IVPB  Status:  Discontinued        2 g 200 mL/hr over 30 Minutes Intravenous Every 24 hours 11/11/2021 1622 11/10/21 0618   11/22/2021 1624  vancomycin variable dose per unstable renal function (pharmacist dosing)  Status:  Discontinued         Does not apply See admin instructions 11/08/2021 1624 11/10/21 0613   11/03/2021 1030  ceFEPIme (MAXIPIME) 2 g in sodium chloride 0.9 % 100 mL IVPB        2 g 200 mL/hr over 30 Minutes Intravenous  Once 11/11/2021 1016 11/16/2021 1133   11/18/2021 1030  metroNIDAZOLE (FLAGYL) IVPB 500 mg        500 mg 100 mL/hr over 60 Minutes Intravenous  Once 11/11/2021 1016 11/27/2021 1915   11/13/2021 1030  vancomycin (VANCOCIN) IVPB 1000 mg/200 mL premix  Status:  Discontinued        1,000 mg 200 mL/hr over 60 Minutes Intravenous  Once 11/27/2021 1016 11/26/2021 1023   11/26/2021 1030  vancomycin (VANCOREADY) IVPB 1500 mg/300 mL        1,500 mg 150 mL/hr over 120 Minutes Intravenous  Once 11/04/2021 1023 11/25/2021 1341       Assessment/Plan:    1 - Bladder Cancer - good prognosis. No further cancer-directed therapy.    2 - Urinary Diversion - working appropriately. Stents in good position   3 - Sepsis, Pyelonephritis, Abdominal Abscess R/O CDiff- Improving s/p abd drain suggests this was second primary infectious site in addition to pyelo. Thankful no signs of bowel leak. Drain Cr drawn and pending to max r/o urine leak.  Continue  to greatly appreciate medical team, IR, ID,nephrol and gen surg comanagement with this complex patient.    [name] 11/22/2021

## 2021-11-22 NOTE — Progress Notes (Signed)
   11/20/21 1700  PT Visit Information  Assistance Needed +2  History of Present Illness Patient  is a 80 year old male who presented from urology clinic on 11/10 with low blood pressure. patient was found to have septic shock. PMH: s/p robotic cystoprostatectomy, pelvic node dissection , conduit diversion, extensive adhesioloysis , small bowel resection and cysto / ICG injection on 10/25/21 due to bladder cancer, tinnitus, Covid-19, back surgery  Precautions  Precautions Fall  Precaution Comments urostomy, left drain, has been getting weaker  Pain Assessment  Pain Location L lower back  Pain Descriptors / Indicators Discomfort;Grimacing  Cognition  Arousal/Alertness Awake/alert  Behavior During Therapy WFL for tasks assessed/performed;Flat affect  Overall Cognitive Status No family/caregiver present to determine baseline cognitive functioning  General Comments except stated [place].  Bed Mobility  Bed Mobility Supine to Sit;Rolling  Supine to sit Max assist;HOB elevated  Sit to supine Max assist;+2 for physical assistance;+2 for safety/equipment  General bed mobility comments assist with both legs and assist with trunk, assist with legs and trunk to return to suine and roll  Transfers  Transfers Sit to/from Stand  Sit to Stand From elevated surface;Min assist  Transfer via [place] transfer comment increased time. Patient able to reach for horizintal bar of [place] and stand with min assistnace. Stood inside [facility], able to perform slight knee bends, weight shift and small steps in place. Patient stood x 8 minutes then 3 minutes.  Balance  Overall balance assessment Needs assistance  Standing balance support Reliant on assistive device for balance  Standing balance-Leahy Scale Fair  General Exercises - Lower Extremity  Ankle Circles/Pumps AROM;Both;10 reps;Seated  Short Arc Quad AROM;10 reps;Both;Supine  Hip ABduction/ADduction AAROM;Both;10 reps;Supine  PT -  End of Session  Activity Tolerance Patient tolerated treatment well  Patient left in bed;with call bell/phone within reach;with bed alarm set  Nurse Communication Mobility status;Other (comment);Patient requests pain meds   PT - Assessment/Plan  PT Plan Current plan remains appropriate (may need SNF/CIR)  PT Visit Diagnosis Difficulty in walking, not elsewhere classified (R26.2);Muscle weakness (generalized) (M62.81);Pain  PT Frequency (ACUTE ONLY) Min 3X/week  Follow Up Recommendations Home health PT (vs post acute rehab faci;ity)  Assistance recommended at discharge Frequent or constant Supervision/Assistance  PT equipment None recommended by PT  AM-PAC PT "6 Clicks" Mobility Outcome Measure (Version 2)  Help needed turning from your back to your side while in a flat bed without using bedrails? 2  Help needed moving from lying on your back to sitting on the side of a flat bed without using bedrails? 1  Help needed moving to and from a bed to a chair (including a wheelchair)? 2  Help needed standing up from a chair using your arms (e.g., wheelchair or bedside chair)? 2  Help needed to walk in hospital room? 1  Help needed climbing 3-5 steps with a railing?  1  6 Click Score 9  Consider Recommendation of Discharge To: CIR/SNF/LTACH  PT Goal Progression  Progress towards PT goals Progressing toward goals  Acute Rehab PT Goals  PT Goal Formulation With patient  Time For Goal Achievement 12/04/21  Potential to [place] Pager [phone] Office [phone]

## 2021-11-22 NOTE — Progress Notes (Signed)
[facility] for Infectious Disease    Date of Admission:  11/10/2021      ID: William [name] is a 80 y.o. male with  kleb oxytoca bacteremia from pyelonephritis, and now c.albicans fungemia from intra-abdominal abscess s/p drain on 11/19 Principal Problem:   Septic shock ([place]) Active Problems:   Chronic pain   Bladder cancer ([place])   Bacteremia due to Gram-negative bacteria   UTI due to Klebsiella species    Subjective: Fever of 100.26F yesterday. Right arm swelling more prominent  Medications:   (feeding supplement) PROSource Plus  30 mL Oral BID BM   chlorhexidine  15 mL Mouth Rinse BID   Chlorhexidine Gluconate Cloth  6 each Topical Daily   feeding supplement  237 mL Oral TID BM   fluticasone  2 spray Each Nare Daily   heparin  5,000 Units Subcutaneous Q8H   insulin aspart  0-9 Units Subcutaneous Q6H   lip balm  1 application Topical BID   magic mouthwash w/lidocaine  10 mL Oral QID   mouth rinse  15 mL Mouth Rinse q12n4p   multivitamin with minerals  1 tablet Oral Daily   nystatin  5 mL Oral QID   nystatin   Topical BID   QUEtiapine  12.5 mg Oral QHS   saccharomyces boulardii  250 mg Oral BID   sodium chloride flush  10-40 mL Intracatheter Q12H   sodium chloride flush  10-40 mL Intracatheter Q12H   sodium chloride flush  5 mL Intracatheter Q8H   traZODone  50 mg Oral QHS   umeclidinium bromide  1 puff Inhalation Daily    Objective: Vital signs in last 24 hours: Temp:  [97.4 F (36.3 C)-100.8 F (38.2 C)] 99.9 F (37.7 C) (11/23 1200) Pulse Rate:  [102-123] 111 (11/23 1500) Resp:  [14-24] 19 (11/23 1500) BP: (103-134)/(51-77) 127/74 (11/23 1500) SpO2:  [91 %-98 %] 95 % (11/23 1500) Weight:  [91.3 kg] 91.3 kg (11/23 0500)  Physical Exam  Constitutional: He is oriented to person, place, and time. He appears chronically ill and cachetic. No distress.  HENT: dry hsv lip lesion Mouth/Throat: Oropharynx is clear and moist. No oropharyngeal exudate.   Cardiovascular: Normal rate, regular rhythm and normal heart sounds. Exam reveals no gallop and no friction rub.  No murmur heard.  Pulmonary/Chest: Effort normal and breath sounds normal. No respiratory distress. He has no wheezes.  Abdominal: Soft. Bowel sounds are normal. He exhibits no distension. There is no tenderness.  Ext: anasarca but right arm swelling more diffuse. Picc line is c/d/i Neurological: He is alert and oriented to person, place, and time.  Skin: Skin is warm and dry. No rash noted. No erythema.  Psychiatric: He has a normal mood and affect. His behavior is normal.    Lab Results Recent Labs    11/21/21 0515 11/21/21 1955 11/22/21 0500  WBC 8.4 9.3 9.2  HGB 7.7* 9.7* 9.7*  HCT 24.4* 30.0* 29.7*  NA 136  136  --  137  K 3.3*  3.2*  --  3.2*  CL 107  106  --  109  CO2 22  22  --  21*  BUN 26*  27*  --  23  CREATININE 2.45*  2.48*  --  1.84*   Liver Panel Recent Labs    11/21/21 0515 11/22/21 0500  PROT 5.2* 5.5*  ALBUMIN 1.5*  <1.5* <1.5*  AST 27 26  ALT 16 15  ALKPHOS 67 83  BILITOT 0.7  1.0    Microbiology: 11/20 blood cx ngtd Studies/Results: [place] EKG SITE RITE  Result Date: 11/22/2021 If Site Rite image not attached, placement could not be confirmed due to current cardiac rhythm.    Assessment/Plan: Fungemia with c.albicans due to intra-abdominal abscess= drain in place. Continue in anidulafungin.   Kleb oxytoca bacteremia = today is last day of piptazo  Right arm swelling = recommend rule out DVT of right arm. We will ask vascular tream to change picc line from right arm to left arm  Anasarca = receiving albumen  Hsv lip lesion =healing. No need for acyclovir  Aki = continues to be followed by nephrology for management  Dr Tommy Medal available over the weekend.  [facility] Pager: [phone]  11/22/2021, 3:30 PM

## 2021-11-22 NOTE — Progress Notes (Signed)
PROGRESS NOTE    William [name]  PPI:[id number] DOB: 1941-08-22 DOA: 11/08/2021 PCP: [name], MD   Brief Narrative:  80 year old with history of bladder cancer status post robotic assisted laparoscopic radical cystectomy/prostatectomy on 10/25/2021 by [name], bilateral pelvic lymph node dissection, extensive laparoscopic DCO lysis, small bowel resection with ileal conduit, urinary diversion on 10/26/2021, COPD, tobacco use initially discharged on 11/02/2021 but returned back to the hospital 11/12/2021 due to hypotension.  Patient was found to be in septic shock secondary to urinary tract infection from multiple species requiring vasopressors and was admitted to the ICU.  Eventually patient was transferred to ICU on 11/11/2021.  Hospital course was complicated by ileus/partial small bowel obstruction therefore general surgery was consulted.  This was conservatively managed.  Initially in renal function worsened but thereafter it slowly improved.  Repeat CT scan showed large intra-abdominal abscess 25.5 centimeters the cultures grew Candida albicans.  Patient was started on IV Diflucan, ID was Consulted and was switched to Eraxis.  IR drained intra-abdominal abscess on 11/19.  Also received PRBC transfusion during hospitalization.   Assessment & Plan:   Principal Problem:   Septic shock ([place]) Active Problems:   Chronic pain   Bladder cancer ([place])   Bacteremia due to Gram-negative bacteria   UTI due to Klebsiella species  Septic shock, sepsis criteria is improving, no longer on vasopressor, secondary to Klebsiella oxytoca/Enterococcus faecalis UTI/Klebsiella oxytoca bacteremia, newly diagnosed Candida albicans fungemia Intra-abdominal abscess -Shock physiology has resolved.  Initially required broad-spectrum antibiotics which has now been switched to Zosyn due to intra-abdominal abscess. - Infectious diseases following - Continue to monitor culture data - Previous blood cultures  11/10-grew Klebsiella, urine cultures grew Enterococcus faecalis/Klebsiella - Blood cultures 11/18 grew Candida albicans -Benign eye exam, completed by ophthalmology [name] Close on 11/19/2021.  No evidence of fungal endophthalmitis.  Intra-abdominal abscess seen on CT scan measuring 25.5 cm  -Status post image guided drain placement left lower quadrant by interventional radiology [name] on 11/18/2021. -IV Zosyn, IV Eraxis - IR following for output  Small bowel obstruction, now ileus/partial small bowel obstruction. -Initially noted on 11/10 on CT abdomen/pelvis, NG tube placed which was removed on 11/15.  Currently on dysphagia 3 diet.  TPN discontinued due to fungemia.   Acute blood loss anemia -Baseline hemoglobin 13.  Required multiple PRBC transfusion 1U 11/19; 2U 11/22   Intermittent delirium -Secondary to underlying illness.  Getting bedtime Seroquel and trazodone.  Delirium protocol   Anasarca with hypoalbuminemia -Albumin supplements ordered   Acute hypoxic respiratory failure secondary to bilateral pleural effusions Acute diastolic congestive heart failure with bilateral pleural effusions -As needed oxygen as needed, I-S/flutter valve.  Bronchodilators.  Out of bed to chair   Bilateral pleural effusion secondary to volume overload -Difficult to diurese the patient due to intermittent hypotension.  Partly driven by hypoalbuminemia as well and immobility.  We will place him on albumin.   Sinus tachycardia suspect related to sepsis and ongoing lung physiology HR 120-130's>> improving 105-113 Continue to treat underlying conditions PRN Lopressor 2.5 mg q6H if HR is consistently>130   Nonoliguric acute kidney injury -Baseline creatinine around 0.8.  Slowly trended up and peaked at 2.58 > 1.84.  Creatinine today is 1.84 creatinine today pending   Improving severe left flank/back pain secondary to intra-abdominal large abscess seen on CT scan. -As needed pain control     Oral thrush -Already on Eraxis, Magic mouthwash with lidocaine Continue IV Eraxis Continue oral care Magic mouthwash  with lidocaine   Refractory hypokalemia post repletion -As needed repletion of electrolytes   Bladder cancer s/p radical cystectomy, prostatectomy and ileal conduit by urology [name] on 10/25/2021, and 10/26/2021. Underwent robotic assisted laparoscopic radical cystectomy, robotic radical prostatectomy, bilateral pelvic lymph node dissection, extensive laparoscopic adhesiolysis, small bowel resection and ileal conduit urinary diversion on 10/25/2021 and on 10/26/2021 by [name]. -Urology team following   COPD History of tobacco abuse -Continue Incruse, as needed bronchodilators, I-S/flutter.-Noted on CT scan.  Will need outpatient pulmonary follow-up   Suspected ILD Noted on CT imaging this admission.  History of tobacco abuse. --Flonase --Outpatient pulmonology follow-up   Weakness/deconditioning/debility: PT/OT   Severe protein calorie malnutrition. -Encourage oral intake    DVT prophylaxis: Subcutaneous heparin Code Status: Full code Family Communication:    Status is: Inpatient  Remains inpatient appropriate because: Multiple ongoing issues as mentioned above, not ready for discharge.     Nutritional status  Nutrition Problem: Inadequate oral intake Etiology: decreased appetite  Signs/Symptoms: per patient/family report  Interventions: Ensure Enlive (each supplement provides 350kcal and 20 grams of protein), Prostat, MVI  Body mass index is 31.52 kg/m.           Subjective: Patient is sitting up in the bed, does not have any complaints.  He is tolerating orals overall but soon after I left I was informed by the nurse that patient had a coughing spell after he had his oral supplements.  Vitals are overall stable.  Review of Systems Otherwise negative except as per HPI, including: General: Denies fever, chills, night sweats or  unintended weight loss. Resp: Denies cough, wheezing, shortness of breath. Cardiac: Denies chest pain, palpitations, orthopnea, paroxysmal nocturnal dyspnea. GI: Denies abdominal pain, nausea, vomiting, diarrhea or constipation GU: Denies dysuria, frequency, hesitancy or incontinence MS: Denies muscle aches, joint pain or swelling Neuro: Denies headache, neurologic deficits (focal weakness, numbness, tingling), abnormal gait Psych: Denies anxiety, depression, SI/HI/AVH Skin: Denies new rashes or lesions ID: Denies sick contacts, exotic exposures, travel  Examination:  General exam: Appears calm and comfortable  Respiratory system: Some bibasilar crackles Cardiovascular system: S1 & S2 heard, RRR. No JVD, murmurs, rubs, gallops or clicks. No pedal edema. Gastrointestinal system: Abdomen is nondistended, soft and nontender. No organomegaly or masses felt. Normal bowel sounds heard. Central nervous system: Alert and oriented. No focal neurological deficits. Extremities: Symmetric 5 x 5 power. Skin: Urostomy in right lower quadrant Psychiatry: Judgement and insight appear normal. Mood & affect appropriate.  PICC line in place Drain in place Foley catheter in place  Objective: Vitals:   11/22/21 0400 11/22/21 0500 11/22/21 0600 11/22/21 0700  BP: 115/64 119/62 (!) 113/53 (!) 122/55  Pulse: (!) 107  (!) 111 (!) 112  Resp: 20 20 18 17   Temp: 97.6 F (36.4 C)     TempSrc: Axillary     SpO2: 95%  96% 95%  Weight:  91.3 kg    Height:        Intake/Output Summary (Last 24 hours) at 11/22/2021 0739 Last data filed at 11/22/2021 0645 Gross per 24 hour  Intake 1657.64 ml  Output 2650 ml  Net -992.36 ml   Filed Weights   11/20/21 0500 11/21/21 0500 11/22/21 0500  Weight: 90.6 kg 91.2 kg 91.3 kg     Data Reviewed:   CBC: Recent Labs  Lab 11/16/21 1201 11/17/21 0521 11/19/21 0358 11/20/21 0800 11/21/21 0515 11/21/21 1955 11/22/21 0500  WBC 20.7*   < > 13.4* 8.1 8.4 9.3  9.2  NEUTROABS 17.7*  --   --   --  6.4  --  7.3  HGB 8.5*   < > 7.5* 8.0* 7.7* 9.7* 9.7*  HCT 26.9*   < > 23.7* 25.1* 24.4* 30.0* 29.7*  MCV 93.1   < > 93.7 91.3 92.1 90.6 90.8  PLT 314   < > 317 315 315 315 308   < > = values in this interval not displayed.   Basic Metabolic Panel: Recent Labs  Lab 11/16/21 0527 11/16/21 1201 11/17/21 0521 11/18/21 0158 11/18/21 1928 11/19/21 0358 11/20/21 0540 11/21/21 0515 11/22/21 0500  NA 138   < > 135 135  --  136 135 136  136 137  K 3.2*   < > 3.6 3.6  --  3.5 3.0* 3.3*  3.2* 3.2*  CL 106   < > 104 104  --  105 104 107  106 109  CO2 25   < > 24 24  --  23 21* 22  22 21*  GLUCOSE 95   < > 126* 119*  --  73 85 89  89 92  BUN 17   < > 22 31*  --  33* 24* 26*  27* 23  CREATININE 1.07   < > 1.74* 2.56*  --  2.58* 1.67* 2.45*  2.48* 1.84*  CALCIUM 7.4*   < > 7.4* 7.2*  --  7.0* 7.3* 7.2*  7.1* 7.4*  MG 1.9  --  2.1 2.0  --   --  1.8 1.9  --   PHOS 3.3  --  3.0  --  4.5 4.7* 2.9 3.5  --    < > = values in this interval not displayed.   GFR: Estimated Creatinine Clearance: 35.1 mL/min (A) (by C-G formula based on SCr of 1.84 mg/dL (H)). Liver Function Tests: Recent Labs  Lab 11/18/21 0158 11/19/21 0358 11/20/21 0540 11/21/21 0515 11/22/21 0500  AST 30 26 26 27 26   ALT 17 15 16 16 15   ALKPHOS 84 74 78 67 83  BILITOT 0.7 0.7 0.7 0.7 1.0  PROT 5.6* 4.8* 5.7* 5.2* 5.5*  ALBUMIN 1.8* 1.7* 1.6* 1.5*  <1.5* <1.5*   Recent Labs  Lab 11/17/21 1020  LIPASE 53*   No results for input(s): AMMONIA in the last 168 hours. Coagulation Profile: Recent Labs  Lab 11/18/21 1928  INR 1.3*   Cardiac Enzymes: No results for input(s): CKTOTAL, CKMB, CKMBINDEX, TROPONINI in the last 168 hours. BNP (last 3 results) No results for input(s): PROBNP in the last 8760 hours. HbA1C: No results for input(s): HGBA1C in the last 72 hours. CBG: Recent Labs  Lab 11/20/21 2330 11/21/21 0533 11/21/21 1146 11/21/21 1733 11/22/21 0010   GLUCAP 109* 94 98 91 93   Lipid Profile: Recent Labs    11/20/21 0540  TRIG 131   Thyroid Function Tests: Recent Labs    11/20/21 1320  TSH 1.262   Anemia Panel: No results for input(s): VITAMINB12, FOLATE, FERRITIN, TIBC, IRON, RETICCTPCT in the last 72 hours. Sepsis Labs: Recent Labs  Lab 11/16/21 1201 11/17/21 0521 11/17/21 1020 11/18/21 0229  PROCALCITON  --  0.74  --   --   LATICACIDVEN 1.0  --  2.3* 1.0    Recent Results (from the past 240 hour(s))  Culture, blood (routine x 2)     Status: Abnormal (Preliminary result)   Collection Time: 11/17/21  5:10 AM   Specimen: BLOOD  Result Value Ref Range Status  Specimen Description   Final    BLOOD BLOOD LEFT HAND Performed at [facility] [address] [place], [place] [address]    Special Requests   Final    BOTTLES DRAWN AEROBIC ONLY Blood Culture adequate volume Performed at [facility] [address] [place], [place] [address]    Culture  Setup Time   Final    YEAST AEROBIC BOTTLE ONLY CRITICAL VALUE NOTED.  VALUE IS CONSISTENT WITH PREVIOUSLY REPORTED AND CALLED VALUE. Performed at [facility], [place] [address], [place], [place] [address]    Culture CANDIDA ALBICANS (A)  Final   Report Status PENDING  Incomplete  Culture, blood (routine x 2)     Status: Abnormal (Preliminary result)   Collection Time: 11/17/21  5:20 AM   Specimen: BLOOD  Result Value Ref Range Status   Specimen Description   Final    BLOOD BLOOD LEFT WRIST Performed at [facility] [address], [place], [place] [address]    Special Requests   Final    BOTTLES DRAWN AEROBIC ONLY Blood Culture adequate volume Performed at [facility] [address] [place], [place] [address]    Culture  Setup Time   Final    YEAST AEROBIC BOTTLE ONLY CRITICAL RESULT CALLED TO, READ BACK BY AND VERIFIED WITH: A PHAM PHARMD @0821  11/18/21 EB    Culture  (A)  Final    CANDIDA ALBICANS Sent to [place] for further susceptibility testing. Performed at [facility], [place] [address], [place],  [address]    Report Status PENDING  Incomplete  Blood Culture ID Panel (Reflexed)     Status: Abnormal   Collection Time: 11/17/21  5:20 AM  Result Value Ref Range Status   Enterococcus faecalis NOT DETECTED NOT DETECTED Final   Enterococcus Faecium NOT DETECTED NOT DETECTED Final   Listeria monocytogenes NOT DETECTED NOT DETECTED Final   Staphylococcus species NOT DETECTED NOT DETECTED Final   Staphylococcus aureus (BCID) NOT DETECTED NOT DETECTED Final   Staphylococcus epidermidis NOT DETECTED NOT DETECTED Final   Staphylococcus lugdunensis NOT DETECTED NOT DETECTED Final   Streptococcus species NOT DETECTED NOT DETECTED Final   Streptococcus agalactiae NOT DETECTED NOT DETECTED Final   Streptococcus pneumoniae NOT DETECTED NOT DETECTED Final   Streptococcus pyogenes NOT DETECTED NOT DETECTED Final   A.calcoaceticus-baumannii NOT DETECTED NOT DETECTED Final   Bacteroides fragilis NOT DETECTED NOT DETECTED Final   Enterobacterales NOT DETECTED NOT DETECTED Final   Enterobacter cloacae complex NOT DETECTED NOT DETECTED Final   Escherichia coli NOT DETECTED NOT DETECTED Final   Klebsiella aerogenes NOT DETECTED NOT DETECTED Final   Klebsiella oxytoca NOT DETECTED NOT DETECTED Final   Klebsiella pneumoniae NOT DETECTED NOT DETECTED Final   Proteus species NOT DETECTED NOT DETECTED Final   Salmonella species NOT DETECTED NOT DETECTED Final   Serratia marcescens NOT DETECTED NOT DETECTED Final   Haemophilus influenzae NOT DETECTED NOT DETECTED Final   Neisseria meningitidis NOT DETECTED NOT DETECTED Final   Pseudomonas aeruginosa NOT DETECTED NOT DETECTED Final   Stenotrophomonas maltophilia NOT DETECTED NOT DETECTED Final   Candida albicans DETECTED (A) NOT DETECTED Final    Comment: CRITICAL RESULT CALLED TO, READ BACK BY AND VERIFIED  WITH: A PHAM PHARMD @0821  11/18/21 EB    Candida auris NOT DETECTED NOT DETECTED Final   Candida glabrata NOT DETECTED NOT DETECTED Final   Candida krusei NOT DETECTED NOT DETECTED Final  Candida parapsilosis NOT DETECTED NOT DETECTED Final   Candida tropicalis NOT DETECTED NOT DETECTED Final   Cryptococcus neoformans/gattii NOT DETECTED NOT DETECTED Final    Comment: Performed at [facility], [address], [place], [place] [address]  Antifungal AST 9 Drug Panel     Status: None (Preliminary result)   Collection Time: 11/17/21  5:20 AM  Result Value Ref Range Status   Organism ID, Yeast Preliminary report  Final    Comment: (NOTE) Specimen has been received and testing has been initiated. Performed At: [facility] [place], [place] [id number] Rush Farmer MD [id number]    Amphotericin B MIC PENDING  Incomplete   Anidulafungin MIC PENDING  Incomplete   Caspofungin MIC PENDING  Incomplete   Micafungin MIC PENDING  Incomplete   Posaconazole MIC PENDING  Incomplete   Fluconazole Islt MIC PENDING  Incomplete   Flucytosine MIC PENDING  Incomplete   Itraconazole MIC PENDING  Incomplete   Voriconazole MIC PENDING  Incomplete   Source [id number] SORUCE BLD C. ALBICANS SENSI  Final    Comment: Performed at [facility], [place] [address] [place], [place] [address]  MRSA Next Gen by PCR, Nasal     Status: None   Collection Time: 11/17/21  6:24 PM   Specimen: Nasal Mucosa; Nasal Swab  Result Value Ref Range Status   MRSA by PCR Next Gen NOT DETECTED NOT DETECTED Final    Comment: (NOTE) The GeneXpert MRSA Assay (FDA approved for NASAL specimens only), is one component of a comprehensive MRSA colonization surveillance program. It is not intended to diagnose MRSA infection nor to guide or monitor treatment for MRSA infections. Test performance is not FDA approved in patients less than 60 years old. Performed at [facility], [place]  [address], [place], [place] [address]   C Difficile Quick Screen (NO PCR Reflex)     Status: None   Collection Time: 11/17/21  8:40 PM   Specimen: STOOL  Result Value Ref Range Status   C Diff antigen NEGATIVE NEGATIVE Final   C Diff toxin NEGATIVE NEGATIVE Final   C Diff interpretation NEGATIVE  Final    Comment: Performed at [facility], [place] [address] [place], [place] [address]  Aerobic/Anaerobic Culture w Gram Stain (surgical/deep wound)     Status: None (Preliminary result)   Collection Time: 11/18/21 11:01 AM   Specimen: Abdomen; Abscess  Result Value Ref Range Status   Specimen Description   Final    ABDOMEN Performed at [facility] [address] [place], [place] [address]    Special Requests   Final    ABSCES Performed at [facility], [place] [address], [place], [place] [address]    Gram Stain   Final    MODERATE WBC PRESENT,BOTH PMN AND MONONUCLEAR NO ORGANISMS SEEN    Culture   Final    CULTURE REINCUBATED FOR BETTER GROWTH Performed at [facility], [place] [address] [place], [place] [address]    Report Status PENDING  Incomplete  Culture, fungus without smear     Status: None (Preliminary result)   Collection Time: 11/18/21 11:02 AM   Specimen: Abscess; Other  Result Value Ref Range Status   Specimen Description   Final    ABSCESS Performed at [facility] [address] [place], [place] [address]    Special Requests ABDOMEN  Final   Culture   Final  CULTURE REINCUBATED FOR BETTER GROWTH Performed at [facility], [place] [address], [place], [place] [address]    Report Status PENDING  Incomplete  Urine Culture     Status: Abnormal   Collection Time: 11/18/21  3:03 PM   Specimen: Urine, Clean Catch  Result Value Ref Range Status   Specimen Description   Final    URINE, CLEAN CATCH Performed at [facility], [place] [address], [place], [place]  [address]    Special Requests   Final    NONE Performed at [facility], [place] [address], [place], [place] [address]    Culture (A)  Final    <10,000 COLONIES/mL INSIGNIFICANT GROWTH Performed at [facility] [address] [place], [place] [address]    Report Status 11/20/2021 FINAL  Final  Culture, blood (routine x 2)     Status: None (Preliminary result)   Collection Time: 11/18/21  7:27 PM   Specimen: BLOOD  Result Value Ref Range Status   Specimen Description   Final    BLOOD BLOOD LEFT WRIST Performed at [facility] [address], [place], [place] [address]    Special Requests   Final    BOTTLES DRAWN AEROBIC ONLY Blood Culture adequate volume Performed at [facility] [address] [place], [place] [address]    Culture   Final    NO GROWTH 2 DAYS Performed at [facility] [address] [place], [place] [address]    Report Status PENDING  Incomplete  Fungus culture, blood     Status: None (Preliminary result)   Collection Time: 11/18/21  7:28 PM   Specimen: BLOOD  Result Value Ref Range Status   Specimen Description   Final    BLOOD BLOOD LEFT HAND Performed at [facility] [address], [place], [place] [address]    Special Requests   Final    BOTTLES DRAWN AEROBIC ONLY Blood Culture adequate volume Performed at [facility] [address], [place], [place] [address]    Culture   Final    NO GROWTH 2 DAYS Performed at [facility] [address] [place], [place] [address]    Report Status PENDING  Incomplete  Culture, blood (routine x 2)     Status: None (Preliminary result)   Collection Time: 11/18/21  7:28 PM   Specimen: BLOOD  Result Value Ref Range Status   Specimen Description   Final    BLOOD BLOOD LEFT HAND Performed at [facility] [address], [place], [place] [address]    Special Requests   Final     BOTTLES DRAWN AEROBIC ONLY Blood Culture adequate volume Performed at [facility] [address], [place], [place] [address]    Culture   Final    NO GROWTH 2 DAYS Performed at [facility] [address] [place], [place] [address]    Report Status PENDING  Incomplete         Radiology Studies: No results found.      Scheduled Meds:  (feeding supplement) PROSource Plus  30 mL Oral BID BM   chlorhexidine  15 mL Mouth Rinse BID   Chlorhexidine Gluconate Cloth  6 each Topical Daily   feeding supplement  237 mL Oral TID BM   fluticasone  2 spray Each Nare Daily   heparin  5,000 Units Subcutaneous Q8H   insulin  aspart  0-9 Units Subcutaneous Q6H   lip balm  1 application Topical BID   magic mouthwash w/lidocaine  10 mL Oral QID   mouth rinse  15 mL Mouth Rinse q12n4p   multivitamin with minerals  1 tablet Oral Daily   nystatin  5 mL Oral QID   nystatin   Topical BID   QUEtiapine  12.5 mg Oral QHS   saccharomyces boulardii  250 mg Oral BID   sodium chloride flush  10-40 mL Intracatheter Q12H   sodium chloride flush  10-40 mL Intracatheter Q12H   sodium chloride flush  5 mL Intracatheter Q8H   traZODone  50 mg Oral QHS   umeclidinium bromide  1 puff Inhalation Daily   Continuous Infusions:  sodium chloride 75 mL/hr at 11/14/21 2328   sodium chloride Stopped (11/21/21 1838)   anidulafungin Stopped (11/21/21 1306)   chlorproMAZINE (THORAZINE) IV Stopped (11/22/21 0414)   methocarbamol (ROBAXIN) IV Stopped (11/20/21 2252)   ondansetron (ZOFRAN) IV     piperacillin-tazobactam (ZOSYN)  IV Stopped (11/22/21 0334)   potassium chloride 10 mEq (11/22/21 0728)     LOS: 13 days   Time spent= 35 mins    Henrik Orihuela Arsenio Loader, MD [facility]  If 7PM-7AM, please contact night-coverage  11/22/2021, 7:39 AM

## 2021-11-22 NOTE — Progress Notes (Signed)
William [name]  Subjective: 1975 of UOP and crt down again to 1.84-  SBP has been greater than 110.  Tmax 100.8  Vitals:   11/22/21 0600 11/22/21 0700 11/22/21 0800 11/22/21 0830  BP: (!) 113/53 (!) 122/55    Pulse: (!) 111 (!) 112    Resp: 18 17    Temp:   99.8 F (37.7 C)   TempSrc:   Axillary   SpO2: 96% 95%  95%  Weight:      Height:        Exam: Gen more alert, says hiccups come and go  No jvd or bruits Chest clear bilat to bases, no rales/ wheezing RRR no MRG Abd soft ntnd no mass or ascites +bs Ext diffuse 2+ bilat UE and LE edema Neuro is lethargic, nonfocal and responds appropriately, O x 3   Home meds include - advil, neurontin, percocet prn, zanaflex, spiriva, flonase, prns     Baseline creat from this admission is 0.82- 1.07, eGFR > 60 ml/min.     UA 11/10 - cloudy amber, large H/ LE, 100 prot, many bact, >50 rbc/ wbc   UNa 43, UCr 48     CT kidneys > Adrenals/Urinary Tract: Adrenal glands are unremarkable. Status post cystoprostatectomy and right lower quadrant ileal conduit urinary diversion. Mild bilateral hydronephrosis. Bilateral double-J ureteral stents in position. The left renal pelvic pigtail is incompletely formed (series 2, image 76).    CXR  11/18 - Mild right lower lobe airspace disease unchanged. Improvement in left lower lobe airspace disease and small left effusion. Negative for pulmonary edema    Assessment/ Plan: AKI - b/l creat 0.82- 1.07, eGFR > 60 ml/min from this admission. SP cystectomy/ ileal conduit surgery on 10/26/21. Then admitted here 11/10 w/ urosepsis d/t Klebsiella. Creat was 3 on admit 11/10, improved to 0.8 and then increased up to 1.7 on 11/18. UA from 11/10 and 11/18 c/w infection. Urine lytes c/w ATN or euvolemia. Imaging showed chronic bilat hydro and bilat ureteral stents, cystoprostatectomy and ileal conduit. Nonoliguric AKI likely due to sepsis/ abscess.   Abdominal abscess drained per IR 11/19. Creat  improved quite a bit on 11/21 and UOP very good, felt we were in the home stretch as far as his AKI but crt went up again on 11/22 but down again today. No indication for RRT at this time. Cont rx for sepsis/ infection. Will follow. Possibly BP got a bit too low  prior to crt inc-  seems better now Klebsiella/ candida bacteremia - getting IV zosyn and eraxis now.  Vol overload - feel like he will autodiurese as his kidney function improves-  but could use lasix if felt needed -- not now because do not want to lower pressure or K  SP recent cystectomy w/ ileal conduit - on 10/27, for bladder cancer Nutrition - getting TPN Hypokalemia - getting replaced-  IV ordered for today- K of 3.2.-  also adjust in TPN-  starting diet  Anemia-  transfused 2 units 11/22-  hoping it would help BP and K       William [name] A William [name]  11/22/2021, 11:30 AM   Recent Labs  Lab 11/20/21 0540 11/20/21 0800 11/21/21 0515 11/21/21 1955 11/22/21 0500  K 3.0*  --  3.3*  3.2*  --  3.2*  BUN 24*  --  26*  27*  --  23  CREATININE 1.67*  --  2.45*  2.48*  --  1.84*  CALCIUM 7.3*  --  7.2*  7.1*  --  7.4*  PHOS 2.9  --  3.5  --   --   HGB  --    < > 7.7* 9.7* 9.7*   < > = values in this interval not displayed.   Inpatient medications:  (feeding supplement) PROSource Plus  30 mL Oral BID BM   chlorhexidine  15 mL Mouth Rinse BID   Chlorhexidine Gluconate Cloth  6 each Topical Daily   feeding supplement  237 mL Oral TID BM   fluticasone  2 spray Each Nare Daily   heparin  5,000 Units Subcutaneous Q8H   insulin aspart  0-9 Units Subcutaneous Q6H   lip balm  1 application Topical BID   magic mouthwash w/lidocaine  10 mL Oral QID   mouth rinse  15 mL Mouth Rinse q12n4p   multivitamin with minerals  1 tablet Oral Daily   nystatin  5 mL Oral QID   nystatin   Topical BID   QUEtiapine  12.5 mg Oral QHS   saccharomyces boulardii  250 mg Oral BID   sodium chloride flush  10-40 mL Intracatheter Q12H   sodium  chloride flush  10-40 mL Intracatheter Q12H   sodium chloride flush  5 mL Intracatheter Q8H   traZODone  50 mg Oral QHS   umeclidinium bromide  1 puff Inhalation Daily    sodium chloride 75 mL/hr at 11/14/21 2328   sodium chloride Stopped (11/21/21 1838)   albumin human 25 g (11/22/21 0945)   anidulafungin Stopped (11/22/21 1220)   chlorproMAZINE (THORAZINE) IV Stopped (11/22/21 0414)   methocarbamol (ROBAXIN) IV Stopped (11/20/21 2252)   ondansetron (ZOFRAN) IV     piperacillin-tazobactam (ZOSYN)  IV 3.375 g (11/22/21 0925)   potassium chloride 10 mEq (11/22/21 1031)   sodium chloride, acetaminophen, chlorproMAZINE (THORAZINE) IV, diphenhydrAMINE, guaiFENesin, hydrALAZINE, HYDROmorphone (DILAUDID) injection, magic mouthwash, menthol-cetylpyridinium, methocarbamol (ROBAXIN) IV, metoprolol tartrate, ondansetron (ZOFRAN) IV **OR** ondansetron (ZOFRAN) IV, phenol, senna-docusate, sodium chloride flush, sodium chloride flush

## 2021-11-23 ENCOUNTER — Inpatient Hospital Stay (HOSPITAL_COMMUNITY): Payer: Medicare HMO

## 2021-11-23 ENCOUNTER — Encounter (HOSPITAL_COMMUNITY): Payer: Medicare HMO

## 2021-11-23 DIAGNOSIS — A419 Sepsis, unspecified organism: Secondary | ICD-10-CM | POA: Diagnosis not present

## 2021-11-23 DIAGNOSIS — R6521 Severe sepsis with septic shock: Secondary | ICD-10-CM | POA: Diagnosis not present

## 2021-11-23 DIAGNOSIS — C679 Malignant neoplasm of bladder, unspecified: Secondary | ICD-10-CM | POA: Diagnosis not present

## 2021-11-23 DIAGNOSIS — M25421 Effusion, right elbow: Secondary | ICD-10-CM

## 2021-11-23 DIAGNOSIS — K651 Peritoneal abscess: Secondary | ICD-10-CM

## 2021-11-23 DIAGNOSIS — M7989 Other specified soft tissue disorders: Secondary | ICD-10-CM

## 2021-11-23 DIAGNOSIS — B49 Unspecified mycosis: Secondary | ICD-10-CM

## 2021-11-23 DIAGNOSIS — N179 Acute kidney failure, unspecified: Secondary | ICD-10-CM

## 2021-11-23 DIAGNOSIS — R7881 Bacteremia: Secondary | ICD-10-CM | POA: Diagnosis not present

## 2021-11-23 DIAGNOSIS — Z95828 Presence of other vascular implants and grafts: Secondary | ICD-10-CM

## 2021-11-23 LAB — ANTIFUNGAL AST 9 DRUG PANEL
Amphotericin B MIC: 0.5
Fluconazole Islt MIC: 0.5
Flucytosine MIC: 1
Itraconazole MIC: 0.12
Posaconazole MIC: 0.03
Source: 183119

## 2021-11-23 LAB — COMPREHENSIVE METABOLIC PANEL
ALT: 12 U/L (ref 0–44)
AST: 19 U/L (ref 15–41)
Albumin: 2.3 g/dL — ABNORMAL LOW (ref 3.5–5.0)
Alkaline Phosphatase: 71 U/L (ref 38–126)
Anion gap: 3 — ABNORMAL LOW (ref 5–15)
BUN: 17 mg/dL (ref 8–23)
CO2: 20 mmol/L — ABNORMAL LOW (ref 22–32)
Calcium: 7.7 mg/dL — ABNORMAL LOW (ref 8.9–10.3)
Chloride: 118 mmol/L — ABNORMAL HIGH (ref 98–111)
Creatinine, Ser: 1.41 mg/dL — ABNORMAL HIGH (ref 0.61–1.24)
GFR, Estimated: 51 mL/min — ABNORMAL LOW (ref >60)
Glucose, Bld: 89 mg/dL (ref 70–99)
Potassium: 3.2 mmol/L — ABNORMAL LOW (ref 3.5–5.1)
Sodium: 141 mmol/L (ref 135–145)
Total Bilirubin: 1 mg/dL (ref 0.3–1.2)
Total Protein: 5.8 g/dL — ABNORMAL LOW (ref 6.5–8.1)

## 2021-11-23 LAB — CULTURE, BLOOD (ROUTINE X 2)
Special Requests: ADEQUATE
Special Requests: ADEQUATE

## 2021-11-23 LAB — AEROBIC/ANAEROBIC CULTURE W GRAM STAIN (SURGICAL/DEEP WOUND)

## 2021-11-23 LAB — CBC WITH DIFFERENTIAL/PLATELET
Abs Immature Granulocytes: 0.23 10*3/uL — ABNORMAL HIGH (ref 0.00–0.07)
Basophils Absolute: 0 10*3/uL (ref 0.0–0.1)
Basophils Relative: 1 %
Eosinophils Absolute: 0.1 10*3/uL (ref 0.0–0.5)
Eosinophils Relative: 1 %
HCT: 27.7 % — ABNORMAL LOW (ref 39.0–52.0)
Hemoglobin: 9 g/dL — ABNORMAL LOW (ref 13.0–17.0)
Immature Granulocytes: 3 %
Lymphocytes Relative: 13 %
Lymphs Abs: 1.1 10*3/uL (ref 0.7–4.0)
MCH: 29.6 pg (ref 26.0–34.0)
MCHC: 32.5 g/dL (ref 30.0–36.0)
MCV: 91.1 fL (ref 80.0–100.0)
Monocytes Absolute: 0.6 10*3/uL (ref 0.1–1.0)
Monocytes Relative: 7 %
Neutro Abs: 6.3 10*3/uL (ref 1.7–7.7)
Neutrophils Relative %: 75 %
Platelets: 315 10*3/uL (ref 150–400)
RBC: 3.04 MIL/uL — ABNORMAL LOW (ref 4.22–5.81)
RDW: 15.7 % — ABNORMAL HIGH (ref 11.5–15.5)
WBC: 8.3 10*3/uL (ref 4.0–10.5)
nRBC: 0 % (ref 0.0–0.2)

## 2021-11-23 LAB — GLUCOSE, CAPILLARY
Glucose-Capillary: 110 mg/dL — ABNORMAL HIGH (ref 70–99)
Glucose-Capillary: 82 mg/dL (ref 70–99)
Glucose-Capillary: 91 mg/dL (ref 70–99)
Glucose-Capillary: 94 mg/dL (ref 70–99)

## 2021-11-23 LAB — MAGNESIUM: Magnesium: 2.2 mg/dL (ref 1.7–2.4)

## 2021-11-23 LAB — PHOSPHORUS: Phosphorus: 2.7 mg/dL (ref 2.5–4.6)

## 2021-11-23 MED ORDER — POTASSIUM CHLORIDE 10 MEQ/100ML IV SOLN
10.0000 meq | INTRAVENOUS | Status: AC
  Administered 2021-11-23 (×4): 10 meq via INTRAVENOUS
  Filled 2021-11-23 (×3): qty 100

## 2021-11-23 MED ORDER — ENOXAPARIN SODIUM 80 MG/0.8ML IJ SOSY
80.0000 mg | PREFILLED_SYRINGE | Freq: Two times a day (BID) | INTRAMUSCULAR | Status: DC
  Administered 2021-11-23 – 2021-11-29 (×13): 80 mg via SUBCUTANEOUS
  Filled 2021-11-23 (×14): qty 0.8

## 2021-11-23 NOTE — Progress Notes (Signed)
RUE venous duplex has been completed.  Preliminary findings given to Thomasena Edis, RN at 1350.  Results can be found under chart review under CV PROC. 11/23/2021 4:24 PM Bernadett Milian RVT, RDMS

## 2021-11-23 NOTE — Progress Notes (Signed)
Left TL PICC placed to replace R DL PICC as ordered.

## 2021-11-23 NOTE — Progress Notes (Signed)
Subjective: No new complaints   Antibiotics:  Anti-infectives (From admission, onward)    Start     Dose/Rate Route Frequency Ordered Stop   11/20/21 1000  anidulafungin (ERAXIS) 100 mg in sodium chloride 0.9 % 100 mL IVPB        100 mg 78 mL/hr over 100 Minutes Intravenous Every 24 hours 11/19/21 0729     11/19/21 1200  fluconazole (DIFLUCAN) IVPB 200 mg  Status:  Discontinued        200 mg 100 mL/hr over 60 Minutes Intravenous Every 24 hours 11/18/21 0852 11/19/21 0729   11/19/21 0900  anidulafungin (ERAXIS) 200 mg in sodium chloride 0.9 % 200 mL IVPB        200 mg 78 mL/hr over 200 Minutes Intravenous  Once 11/19/21 0729 11/19/21 1220   11/18/21 0945  fluconazole (DIFLUCAN) IVPB 800 mg        800 mg 200 mL/hr over 120 Minutes Intravenous  Once 11/18/21 0852 11/18/21 1204   11/17/21 1600  piperacillin-tazobactam (ZOSYN) IVPB 3.375 g        3.375 g 12.5 mL/hr over 240 Minutes Intravenous Every 8 hours 11/17/21 1333     11/12/21 0800  Ampicillin-Sulbactam (UNASYN) 3 g in sodium chloride 0.9 % 100 mL IVPB  Status:  Discontinued        3 g 200 mL/hr over 30 Minutes Intravenous Every 6 hours 11/12/21 0712 11/17/21 1333   11/10/21 1400  cefTRIAXone (ROCEPHIN) 2 g in sodium chloride 0.9 % 100 mL IVPB  Status:  Discontinued        2 g 200 mL/hr over 30 Minutes Intravenous Daily 11/10/21 1048 11/12/21 0712   11/10/21 0800  ceFEPIme (MAXIPIME) 2 g in sodium chloride 0.9 % 100 mL IVPB  Status:  Discontinued        2 g 200 mL/hr over 30 Minutes Intravenous Every 12 hours 11/10/21 0618 11/10/21 1048   11/10/21 0630  ceFEPIme (MAXIPIME) 2 g in sodium chloride 0.9 % 100 mL IVPB  Status:  Discontinued        2 g 200 mL/hr over 30 Minutes Intravenous Every 24 hours 11/15/2021 1622 11/10/21 0618   11/18/2021 1624  vancomycin variable dose per unstable renal function (pharmacist dosing)  Status:  Discontinued         Does not apply See admin instructions 11/06/2021 1624 11/10/21 0613    11/24/2021 1030  ceFEPIme (MAXIPIME) 2 g in sodium chloride 0.9 % 100 mL IVPB        2 g 200 mL/hr over 30 Minutes Intravenous  Once 11/25/2021 1016 11/15/2021 1133   11/20/2021 1030  metroNIDAZOLE (FLAGYL) IVPB 500 mg        500 mg 100 mL/hr over 60 Minutes Intravenous  Once 11/27/2021 1016 11/11/2021 1915   11/14/2021 1030  vancomycin (VANCOCIN) IVPB 1000 mg/200 mL premix  Status:  Discontinued        1,000 mg 200 mL/hr over 60 Minutes Intravenous  Once 11/07/2021 1016 11/20/2021 1023   11/13/2021 1030  vancomycin (VANCOREADY) IVPB 1500 mg/300 mL        1,500 mg 150 mL/hr over 120 Minutes Intravenous  Once 11/22/2021 1023 11/24/2021 1341       Medications: Scheduled Meds:  (feeding supplement) PROSource Plus  30 mL Oral BID BM   chlorhexidine  15 mL Mouth Rinse BID   Chlorhexidine Gluconate Cloth  6 each Topical Daily   feeding supplement  237 mL Oral TID  BM   fluticasone  2 spray Each Nare Daily   heparin  5,000 Units Subcutaneous Q8H   insulin aspart  0-9 Units Subcutaneous Q6H   lip balm  1 application Topical BID   magic mouthwash w/lidocaine  10 mL Oral QID   mouth rinse  15 mL Mouth Rinse q12n4p   multivitamin with minerals  1 tablet Oral Daily   nystatin  5 mL Oral QID   nystatin   Topical BID   QUEtiapine  12.5 mg Oral QHS   saccharomyces boulardii  250 mg Oral BID   sodium chloride flush  10-40 mL Intracatheter Q12H   sodium chloride flush  10-40 mL Intracatheter Q12H   sodium chloride flush  5 mL Intracatheter Q8H   traZODone  50 mg Oral QHS   umeclidinium bromide  1 puff Inhalation Daily   Continuous Infusions:  sodium chloride 75 mL/hr at 11/14/21 2328   sodium chloride Stopped (11/21/21 1838)   albumin human Stopped (11/23/21 8341)   anidulafungin 78 mL/hr at 11/23/21 1038   chlorproMAZINE (THORAZINE) IV Stopped (11/22/21 0414)   methocarbamol (ROBAXIN) IV Stopped (11/20/21 2252)   ondansetron (ZOFRAN) IV     piperacillin-tazobactam (ZOSYN)  IV 12.5 mL/hr at 11/23/21 1038    potassium chloride 100 mL/hr at 11/23/21 1038   PRN Meds:.sodium chloride, acetaminophen, chlorproMAZINE (THORAZINE) IV, diphenhydrAMINE, guaiFENesin, hydrALAZINE, HYDROmorphone (DILAUDID) injection, magic mouthwash, menthol-cetylpyridinium, methocarbamol (ROBAXIN) IV, metoprolol tartrate, ondansetron (ZOFRAN) IV **OR** ondansetron (ZOFRAN) IV, phenol, senna-docusate, sodium chloride flush, sodium chloride flush    Objective: Weight change: -9.4 kg  Intake/Output Summary (Last 24 hours) at 11/23/2021 1212 Last data filed at 11/23/2021 1038 Gross per 24 hour  Intake 1031.4 ml  Output 4675 ml  Net -3643.6 ml   Blood pressure (!) 135/59, pulse (!) 104, temperature 98.5 F (36.9 C), temperature source Oral, resp. rate (!) 26, height 5\' 7"  (1.702 m), weight 81.9 kg, SpO2 96 %. Temp:  [97.7 F (36.5 C)-98.5 F (36.9 C)] 98.5 F (36.9 C) (11/24 0820) Pulse Rate:  [94-114] 104 (11/24 1000) Resp:  [13-28] 26 (11/24 1000) BP: (99-152)/(45-74) 135/59 (11/24 1000) SpO2:  [92 %-98 %] 96 % (11/24 1000) Weight:  [81.9 kg] 81.9 kg (11/24 0500)  Physical Exam: Physical Exam Constitutional:      Appearance: He is well-developed. He is ill-appearing.  HENT:     Head: Normocephalic and atraumatic.  Eyes:     Conjunctiva/sclera: Conjunctivae normal.  Cardiovascular:     Rate and Rhythm: Regular rhythm. Tachycardia present.  Pulmonary:     Effort: Pulmonary effort is normal. No respiratory distress.     Breath sounds: No wheezing.  Abdominal:     General: There is no distension.     Palpations: Abdomen is soft.  Musculoskeletal:        General: Normal range of motion.     Cervical back: Normal range of motion and neck supple.  Skin:    General: Skin is warm and dry.     Coloration: Skin is pale.     Findings: No erythema or rash.  Neurological:     General: No focal deficit present.     Mental Status: He is alert and oriented to person, place, and time.  Psychiatric:        Mood and  Affect: Mood normal.        Behavior: Behavior normal.        Thought Content: Thought content normal.        Judgment:  Judgment normal.     CBC:    BMET Recent Labs    11/22/21 0500 11/23/21 0400  NA 137 141  K 3.2* 3.2*  CL 109 118*  CO2 21* 20*  GLUCOSE 92 89  BUN 23 17  CREATININE 1.84* 1.41*  CALCIUM 7.4* 7.7*     Liver Panel  Recent Labs    11/22/21 0500 11/23/21 0400  PROT 5.5* 5.8*  ALBUMIN <1.5* 2.3*  AST 26 19  ALT 15 12  ALKPHOS 83 71  BILITOT 1.0 1.0       Sedimentation Rate No results for input(s): ESRSEDRATE in the last 72 hours. C-Reactive Protein No results for input(s): CRP in the last 72 hours.  Micro Results: Recent Results (from the past 720 hour(s))  SARS Coronavirus 2 by RT PCR (hospital order, performed in [facility] hospital lab) Nasopharyngeal Nasopharyngeal Swab     Status: None   Collection Time: 10/24/21  4:46 PM   Specimen: Nasopharyngeal Swab  Result Value Ref Range Status   SARS Coronavirus 2 NEGATIVE NEGATIVE Final    Comment: (NOTE) SARS-CoV-2 target nucleic acids are NOT DETECTED.  The SARS-CoV-2 RNA is generally detectable in upper and lower respiratory specimens during the acute phase of infection. The lowest concentration of SARS-CoV-2 viral copies this assay can detect is 250 copies / mL. A negative result does not preclude SARS-CoV-2 infection and should not be used as the sole basis for treatment or other patient management decisions.  A negative result may occur with improper specimen collection / handling, submission of specimen other than nasopharyngeal swab, presence of viral mutation(s) within the areas targeted by this assay, and inadequate number of viral copies (<250 copies / mL). A negative result must be combined with clinical observations, patient history, and epidemiological information.  Fact Sheet for Patients:   StrictlyIdeas.no  Fact Sheet for Healthcare  Providers: BankingDealers.co.za  This test is not yet approved or  cleared by the [place] FDA and has been authorized for detection and/or diagnosis of SARS-CoV-2 by FDA under an Emergency Use Authorization (EUA).  This EUA will remain in effect (meaning this test can be used) for the duration of the COVID-19 declaration under Section 564(b)(1) of the Act, 21 U.S.C. section 360bbb-3(b)(1), unless the authorization is terminated or revoked sooner.  Performed at [facility], [place] [address], [place], [place] [address]   Surgical pcr screen     Status: None   Collection Time: 10/24/21  4:47 PM   Specimen: Nasopharyngeal Swab; Nasal Swab  Result Value Ref Range Status   MRSA, PCR NEGATIVE NEGATIVE Final   Staphylococcus aureus NEGATIVE NEGATIVE Final    Comment: (NOTE) The Xpert SA Assay (FDA approved for NASAL specimens in patients 6 years of age and older), is one component of a comprehensive surveillance program. It is not intended to diagnose infection nor to guide or monitor treatment. Performed at [facility], [place] [address], [place], [place] [address]   Blood Culture (routine x 2)     Status: Abnormal   Collection Time: 11/19/2021 10:16 AM   Specimen: BLOOD RIGHT ARM  Result Value Ref Range Status   Specimen Description BLOOD RIGHT ARM  Final   Special Requests   Final    BOTTLES DRAWN AEROBIC AND ANAEROBIC Blood Culture adequate volume   Culture  Setup Time   Final    GRAM NEGATIVE RODS IN BOTH AEROBIC AND ANAEROBIC BOTTLES CRITICAL RESULT CALLED TO, READ BACK BY AND  VERIFIED WITH: M LILLISTON,PHARMD@[id number]  11/10/21 [place] Performed at [facility], [place] [address], [place], [place]    Culture KLEBSIELLA OXYTOCA (A)  Final   Report Status 11/12/2021 FINAL  Final   Organism ID, Bacteria KLEBSIELLA OXYTOCA  Final      Susceptibility   Klebsiella oxytoca - MIC*    AMPICILLIN >=32 RESISTANT  Resistant     CEFAZOLIN 8 SENSITIVE Sensitive     CEFEPIME <=0.12 SENSITIVE Sensitive     CEFTAZIDIME <=1 SENSITIVE Sensitive     CEFTRIAXONE <=0.25 SENSITIVE Sensitive     CIPROFLOXACIN <=0.25 SENSITIVE Sensitive     GENTAMICIN <=1 SENSITIVE Sensitive     IMIPENEM <=0.25 SENSITIVE Sensitive     TRIMETH/SULFA <=20 SENSITIVE Sensitive     AMPICILLIN/SULBACTAM 8 SENSITIVE Sensitive     PIP/TAZO <=4 SENSITIVE Sensitive     * KLEBSIELLA OXYTOCA  Urine Culture     Status: Abnormal   Collection Time: 11/19/2021 10:16 AM   Specimen: In/Out Cath Urine  Result Value Ref Range Status   Specimen Description   Final    IN/OUT CATH URINE Performed at [place] [address] [place], [place]    Special Requests   Final    NONE Performed at [facility], [place] [address], [place], [place]    Culture (A)  Final    >=100,000 COLONIES/mL KLEBSIELLA OXYTOCA >=100,000 COLONIES/mL ENTEROCOCCUS FAECALIS    Report Status 11/12/2021 FINAL  Final   Organism ID, Bacteria KLEBSIELLA OXYTOCA (A)  Final   Organism ID, Bacteria ENTEROCOCCUS FAECALIS (A)  Final      Susceptibility   Enterococcus faecalis - MIC*    AMPICILLIN <=2 SENSITIVE Sensitive     NITROFURANTOIN <=16 SENSITIVE Sensitive     VANCOMYCIN 1 SENSITIVE Sensitive     * >=100,000 COLONIES/mL ENTEROCOCCUS FAECALIS   Klebsiella oxytoca - MIC*    AMPICILLIN >=32 RESISTANT Resistant     CEFAZOLIN 8 SENSITIVE Sensitive     CEFEPIME <=0.12 SENSITIVE Sensitive     CEFTRIAXONE <=0.25 SENSITIVE Sensitive     CIPROFLOXACIN <=0.25 SENSITIVE Sensitive     GENTAMICIN <=1 SENSITIVE Sensitive     IMIPENEM <=0.25 SENSITIVE Sensitive     NITROFURANTOIN 32 SENSITIVE Sensitive     TRIMETH/SULFA <=20 SENSITIVE Sensitive     AMPICILLIN/SULBACTAM 8 SENSITIVE Sensitive     PIP/TAZO <=4 SENSITIVE Sensitive     * >=100,000 COLONIES/mL KLEBSIELLA OXYTOCA  Blood Culture ID Panel (Reflexed)     Status:  Abnormal   Collection Time: 11/16/2021 10:16 AM  Result Value Ref Range Status   Enterococcus faecalis NOT DETECTED NOT DETECTED Final   Enterococcus Faecium NOT DETECTED NOT DETECTED Final   Listeria monocytogenes NOT DETECTED NOT DETECTED Final   Staphylococcus species NOT DETECTED NOT DETECTED Final   Staphylococcus aureus (BCID) NOT DETECTED NOT DETECTED Final   Staphylococcus epidermidis NOT DETECTED NOT DETECTED Final   Staphylococcus lugdunensis NOT DETECTED NOT DETECTED Final   Streptococcus species NOT DETECTED NOT DETECTED Final   Streptococcus agalactiae NOT DETECTED NOT DETECTED Final   Streptococcus pneumoniae NOT DETECTED NOT DETECTED Final   Streptococcus pyogenes NOT DETECTED NOT DETECTED Final   A.calcoaceticus-baumannii NOT DETECTED NOT DETECTED Final   Bacteroides fragilis NOT DETECTED NOT DETECTED Final   Enterobacterales DETECTED (A) NOT DETECTED Final    Comment: Enterobacterales represent a large order of gram negative bacteria, not a single organism. CRITICAL RESULT CALLED TO, READ BACK BY AND  VERIFIED WITH: M LILLISTON,PHARMD@[id number]  11/10/21 [place]    Enterobacter cloacae complex NOT DETECTED NOT DETECTED Final   Escherichia coli NOT DETECTED NOT DETECTED Final   Klebsiella aerogenes NOT DETECTED NOT DETECTED Final   Klebsiella oxytoca DETECTED (A) NOT DETECTED Final    Comment: CRITICAL RESULT CALLED TO, READ BACK BY AND VERIFIED WITH: M LILLISTON,PHARMD@[id number]  74/25/95 [place]    Klebsiella pneumoniae NOT DETECTED NOT DETECTED Final   Proteus species NOT DETECTED NOT DETECTED Final   Salmonella species NOT DETECTED NOT DETECTED Final   Serratia marcescens NOT DETECTED NOT DETECTED Final   Haemophilus influenzae NOT DETECTED NOT DETECTED Final   Neisseria meningitidis NOT DETECTED NOT DETECTED Final   Pseudomonas aeruginosa NOT DETECTED NOT DETECTED Final   Stenotrophomonas maltophilia NOT DETECTED NOT DETECTED Final   Candida albicans NOT DETECTED NOT DETECTED Final    Candida auris NOT DETECTED NOT DETECTED Final   Candida glabrata NOT DETECTED NOT DETECTED Final   Candida krusei NOT DETECTED NOT DETECTED Final   Candida parapsilosis NOT DETECTED NOT DETECTED Final   Candida tropicalis NOT DETECTED NOT DETECTED Final   Cryptococcus neoformans/gattii NOT DETECTED NOT DETECTED Final   CTX-M ESBL NOT DETECTED NOT DETECTED Final   Carbapenem resistance IMP NOT DETECTED NOT DETECTED Final   Carbapenem resistance KPC NOT DETECTED NOT DETECTED Final   Carbapenem resistance NDM NOT DETECTED NOT DETECTED Final   Carbapenem resist OXA 48 LIKE NOT DETECTED NOT DETECTED Final   Carbapenem resistance VIM NOT DETECTED NOT DETECTED Final    Comment: Performed at [facility], [address] [place], [place] [address]  Blood Culture (routine x 2)     Status: Abnormal   Collection Time: 11/18/2021 10:21 AM   Specimen: BLOOD RIGHT WRIST  Result Value Ref Range Status   Specimen Description   Final    BLOOD RIGHT WRIST Performed at [facility], [address], [place], [place] [address]    Special Requests   Final    BOTTLES DRAWN AEROBIC ONLY Blood Culture adequate volume Performed at [facility] [place] [address] [place], [place] [address]    Culture  Setup Time   Final    GRAM NEGATIVE RODS AEROBIC BOTTLE ONLY CRITICAL VALUE NOTED.  VALUE IS CONSISTENT WITH PREVIOUSLY REPORTED AND CALLED VALUE.    Culture (A)  Final    KLEBSIELLA OXYTOCA SUSCEPTIBILITIES PERFORMED ON PREVIOUS CULTURE WITHIN THE LAST 5 DAYS. Performed at [facility], [place] [address] [place], [place] [address]    Report Status 11/12/2021 FINAL  Final  Resp Panel by RT-PCR (Flu A&B, Covid) Nasopharyngeal Swab     Status: None   Collection Time: 11/28/2021 11:18 AM   Specimen: Nasopharyngeal Swab; Nasopharyngeal(NP) swabs in vial transport medium  Result Value Ref Range Status   SARS Coronavirus 2 by RT PCR NEGATIVE NEGATIVE Final    Comment:  (NOTE) SARS-CoV-2 target nucleic acids are NOT DETECTED.  The SARS-CoV-2 RNA is generally detectable in upper respiratory specimens during the acute phase of infection. The lowest concentration of SARS-CoV-2 viral copies this assay can detect is 138 copies/mL. A negative result does not preclude SARS-Cov-2 infection and should not be used as the sole basis for treatment or other patient management decisions. A negative result may occur with  improper specimen collection/handling, submission of specimen other than nasopharyngeal swab, presence of viral mutation(s) within the areas targeted by this assay, and inadequate number of viral copies(<138 copies/mL). A negative result must  be combined with clinical observations, patient history, and epidemiological information. The expected result is Negative.  Fact Sheet for Patients:  EntrepreneurPulse.com.au  Fact Sheet for Healthcare Providers:  IncredibleEmployment.be  This test is no t yet approved or cleared by the [place] FDA and  has been authorized for detection and/or diagnosis of SARS-CoV-2 by FDA under an Emergency Use Authorization (EUA). This EUA will remain  in effect (meaning this test can be used) for the duration of the COVID-19 declaration under Section 564(b)(1) of the Act, 21 U.S.C.section 360bbb-3(b)(1), unless the authorization is terminated  or revoked sooner.       Influenza A by PCR NEGATIVE NEGATIVE Final   Influenza B by PCR NEGATIVE NEGATIVE Final    Comment: (NOTE) The Xpert Xpress SARS-CoV-2/FLU/RSV plus assay is intended as an aid in the diagnosis of influenza from Nasopharyngeal swab specimens and should not be used as a sole basis for treatment. Nasal washings and aspirates are unacceptable for Xpert Xpress SARS-CoV-2/FLU/RSV testing.  Fact Sheet for Patients: EntrepreneurPulse.com.au  Fact Sheet for Healthcare  Providers: IncredibleEmployment.be  This test is not yet approved or cleared by the [place] FDA and has been authorized for detection and/or diagnosis of SARS-CoV-2 by FDA under an Emergency Use Authorization (EUA). This EUA will remain in effect (meaning this test can be used) for the duration of the COVID-19 declaration under Section 564(b)(1) of the Act, 21 U.S.C. section 360bbb-3(b)(1), unless the authorization is terminated or revoked.  Performed at [facility], [place] [address] [place], [place] [address]   Culture, blood (routine x 2)     Status: Abnormal (Preliminary result)   Collection Time: 11/17/21  5:10 AM   Specimen: BLOOD  Result Value Ref Range Status   Specimen Description   Final    BLOOD BLOOD LEFT HAND Performed at [place] [address], [place], [place] [address]    Special Requests   Final    BOTTLES DRAWN AEROBIC ONLY Blood Culture adequate volume Performed at [facility] [address], [place], [place] [address]    Culture  Setup Time   Final    YEAST AEROBIC BOTTLE ONLY CRITICAL VALUE NOTED.  VALUE IS CONSISTENT WITH PREVIOUSLY REPORTED AND CALLED VALUE. Performed at [facility], [place] [address], [place], [place] [address]    Culture CANDIDA ALBICANS (A)  Final   Report Status PENDING  Incomplete  Culture, blood (routine x 2)     Status: Abnormal (Preliminary result)   Collection Time: 11/17/21  5:20 AM   Specimen: BLOOD  Result Value Ref Range Status   Specimen Description   Final    BLOOD BLOOD LEFT WRIST Performed at [place] [address] [place], [place] [address]    Special Requests   Final    BOTTLES DRAWN AEROBIC ONLY Blood Culture adequate volume Performed at [place] [address] [place], [place] [address]    Culture  Setup Time   Final    YEAST AEROBIC BOTTLE ONLY CRITICAL RESULT CALLED  TO, READ BACK BY AND VERIFIED WITH: A PHAM PHARMD @0821  11/18/21 [place]    Culture (A)  Final    CANDIDA ALBICANS Sent to [place] for further susceptibility testing. Performed at [facility], [place] [address], [place],  [address]    Report Status PENDING  Incomplete  Blood Culture ID Panel (Reflexed)     Status: Abnormal   Collection Time: 11/17/21  5:20 AM  Result Value Ref Range Status   Enterococcus faecalis NOT DETECTED NOT DETECTED Final   Enterococcus Faecium NOT DETECTED NOT DETECTED Final   Listeria monocytogenes NOT DETECTED NOT DETECTED Final   Staphylococcus species NOT DETECTED NOT DETECTED Final   Staphylococcus aureus (BCID) NOT DETECTED NOT DETECTED Final   Staphylococcus epidermidis NOT DETECTED NOT DETECTED Final   Staphylococcus lugdunensis NOT DETECTED NOT DETECTED Final   Streptococcus species NOT DETECTED NOT DETECTED Final   Streptococcus agalactiae NOT DETECTED NOT DETECTED Final   Streptococcus pneumoniae NOT DETECTED NOT DETECTED Final   Streptococcus pyogenes NOT DETECTED NOT DETECTED Final   A.calcoaceticus-baumannii NOT DETECTED NOT DETECTED Final   Bacteroides fragilis NOT DETECTED NOT DETECTED Final   Enterobacterales NOT DETECTED NOT DETECTED Final   Enterobacter cloacae complex NOT DETECTED NOT DETECTED Final   Escherichia coli NOT DETECTED NOT DETECTED Final   Klebsiella aerogenes NOT DETECTED NOT DETECTED Final   Klebsiella oxytoca NOT DETECTED NOT DETECTED Final   Klebsiella pneumoniae NOT DETECTED NOT DETECTED Final   Proteus species NOT DETECTED NOT DETECTED Final   Salmonella species NOT DETECTED NOT DETECTED Final   Serratia marcescens NOT DETECTED NOT DETECTED Final   Haemophilus influenzae NOT DETECTED NOT DETECTED Final   Neisseria meningitidis NOT DETECTED NOT DETECTED Final   Pseudomonas aeruginosa NOT DETECTED NOT DETECTED Final   Stenotrophomonas maltophilia NOT DETECTED NOT DETECTED Final   Candida albicans DETECTED (A) NOT  DETECTED Final    Comment: CRITICAL RESULT CALLED TO, READ BACK BY AND VERIFIED WITH: A PHAM PHARMD @0821  11/18/21 [place]    Candida auris NOT DETECTED NOT DETECTED Final   Candida glabrata NOT DETECTED NOT DETECTED Final   Candida krusei NOT DETECTED NOT DETECTED Final   Candida parapsilosis NOT DETECTED NOT DETECTED Final   Candida tropicalis NOT DETECTED NOT DETECTED Final   Cryptococcus neoformans/gattii NOT DETECTED NOT DETECTED Final    Comment: Performed at [facility], [address] [place], [place] [address]  Antifungal AST 9 Drug Panel     Status: None (Preliminary result)   Collection Time: 11/17/21  5:20 AM  Result Value Ref Range Status   Organism ID, Yeast Preliminary report  Final    Comment: (NOTE) Specimen has been received and testing has been initiated. Performed At: [facility] [place], [place] [id number] Rush Farmer MD [id number]    Amphotericin B MIC PENDING  Incomplete   Anidulafungin MIC PENDING  Incomplete   Caspofungin MIC PENDING  Incomplete   Micafungin MIC PENDING  Incomplete   Posaconazole MIC PENDING  Incomplete   Fluconazole Islt MIC PENDING  Incomplete   Flucytosine MIC PENDING  Incomplete   Itraconazole MIC PENDING  Incomplete   Voriconazole MIC PENDING  Incomplete   Source [id number] SORUCE BLD C. ALBICANS SENSI  Final    Comment: Performed at [facility], [place] [address] [place], [place] [address]  MRSA Next Gen by PCR, Nasal     Status: None   Collection Time: 11/17/21  6:24 PM   Specimen: Nasal Mucosa; Nasal Swab  Result Value Ref Range Status   MRSA by PCR Next Gen NOT DETECTED NOT DETECTED Final    Comment: (NOTE) The GeneXpert MRSA Assay (FDA approved for NASAL specimens only), is one component of a comprehensive MRSA colonization surveillance program. It is not intended to diagnose MRSA infection nor to guide or monitor treatment for MRSA infections. Test performance is not FDA approved in  patients less than 37 years old. Performed at [facility], [place] [address], [place], [place] [address]   C Difficile Quick Screen (NO PCR Reflex)     Status: None   Collection Time: 11/17/21  8:40 PM   Specimen: STOOL  Result Value Ref Range Status   C Diff antigen NEGATIVE NEGATIVE Final   C Diff toxin NEGATIVE NEGATIVE Final   C Diff interpretation NEGATIVE  Final    Comment: Performed at [facility], [place] [address], [place], [place] [address]  Aerobic/Anaerobic Culture w Gram Stain (surgical/deep wound)     Status: None   Collection Time: 11/18/21 11:01 AM   Specimen: Abdomen; Abscess  Result Value Ref Range Status   Specimen Description   Final    ABDOMEN Performed at [place] [address], [place], [place] [address]    Special Requests   Final    ABSCES Performed at [facility], [place] [address] [place], [place] [address]    Gram Stain   Final    MODERATE WBC PRESENT,BOTH PMN AND MONONUCLEAR NO ORGANISMS SEEN    Culture   Final    RARE ESCHERICHIA COLI RARE CANDIDA ALBICANS NO ANAEROBES ISOLATED Performed at [facility], [address], [place], [place] [address]    Report Status 11/23/2021 FINAL  Final   Organism ID, Bacteria ESCHERICHIA COLI  Final      Susceptibility   Escherichia coli - MIC*    AMPICILLIN <=2 SENSITIVE Sensitive     CEFAZOLIN <=4 SENSITIVE Sensitive     CEFEPIME <=0.12 SENSITIVE Sensitive     CEFTAZIDIME <=1 SENSITIVE Sensitive     CEFTRIAXONE <=0.25 SENSITIVE Sensitive     CIPROFLOXACIN <=0.25 SENSITIVE Sensitive     GENTAMICIN <=1 SENSITIVE Sensitive     IMIPENEM <=0.25 SENSITIVE Sensitive     TRIMETH/SULFA <=20 SENSITIVE Sensitive     AMPICILLIN/SULBACTAM <=2 SENSITIVE Sensitive     PIP/TAZO <=4 SENSITIVE Sensitive     * RARE ESCHERICHIA COLI  Fungus Stain     Status: None   Collection Time: 11/18/21 11:01 AM   Specimen: Abscess  Result Value Ref  Range Status   FUNGUS STAIN Final report  Final    Comment: (NOTE) Performed At: [facility] [address] [place], [place] [id number] Rush Farmer MD SA:[id number]    Fungal Source ABSCESS  Final    Comment: Performed at [facility], [place] [address], [place], [place] [address]  Fungal Stain reflex     Status: None   Collection Time: 11/18/21 11:01 AM  Result Value Ref Range Status   Fungal stain result 1 Comment  Final    Comment: (NOTE) KOH/Calcofluor preparation:  no fungus observed. Performed At: [facility] [place], [place] [id number] Rush Farmer MD CW:[id number]   Culture, fungus without smear     Status: Abnormal (Preliminary result)   Collection Time: 11/18/21 11:02 AM   Specimen: Abscess; Other  Result Value Ref Range Status   Specimen Description   Final    ABSCESS Performed at [place] [address] [place], [place] [address]    Special Requests ABDOMEN  Final   Culture (A)  Final    CANDIDA ALBICANS CONTINUING TO HOLD Performed at [facility], [place] [address], [place],  [address]    Report Status PENDING  Incomplete  Urine Culture     Status: Abnormal   Collection  Time: 11/18/21  3:03 PM   Specimen: Urine, Clean Catch  Result Value Ref Range Status   Specimen Description   Final    URINE, CLEAN CATCH Performed at [facility], [place] [address], [place], [place] [address]    Special Requests   Final    NONE Performed at [facility], [place] [address], [place], [place] [address]    Culture (A)  Final    <10,000 COLONIES/mL INSIGNIFICANT GROWTH Performed at [place] [address], [place], [place] [address]    Report Status 11/20/2021 FINAL  Final  Culture, blood (routine x 2)     Status: None (Preliminary result)   Collection Time: 11/18/21  7:27 PM   Specimen: BLOOD  Result Value Ref Range Status   Specimen Description    Final    BLOOD BLOOD LEFT WRIST Performed at [place] [address], [place], [place] [address]    Special Requests   Final    BOTTLES DRAWN AEROBIC ONLY Blood Culture adequate volume Performed at [place] [address] [place], [place] [address]    Culture   Final    NO GROWTH 3 DAYS Performed at [facility], [place] [address] [place], [place] [address]    Report Status PENDING  Incomplete  Fungus culture, blood     Status: None (Preliminary result)   Collection Time: 11/18/21  7:28 PM   Specimen: BLOOD  Result Value Ref Range Status   Specimen Description   Final    BLOOD BLOOD LEFT HAND Performed at [place] [address], [place], [place] [address]    Special Requests   Final    BOTTLES DRAWN AEROBIC ONLY Blood Culture adequate volume Performed at [place] [address], [place], [place] [address]    Culture   Final    NO GROWTH 3 DAYS Performed at [facility], [place] [address], [place], [place] [address]    Report Status PENDING  Incomplete  Culture, blood (routine x 2)     Status: None (Preliminary result)   Collection Time: 11/18/21  7:28 PM   Specimen: BLOOD  Result Value Ref Range Status   Specimen Description   Final    BLOOD BLOOD LEFT HAND Performed at [place] [address], [place], [place] [address]    Special Requests   Final    BOTTLES DRAWN AEROBIC ONLY Blood Culture adequate volume Performed at [place] [address], [place], [place] [address]    Culture   Final    NO GROWTH 3 DAYS Performed at [facility], [place] [address], [place],  [address]    Report Status PENDING  Incomplete    Studies/Results: DG Chest Port 1 View  Result Date: 11/23/2021 CLINICAL DATA:  PICC line placement. EXAM: PORTABLE CHEST 1 VIEW COMPARISON:  11/17/2021 studies FINDINGS: A LEFT-sided PICC line  is noted with tip overlying the mid-LOWER SVC. A RIGHT PICC line is again identified with tip in the region of the SUPERIOR cavoatrial junction. New diffuse bilateral airspace opacities are noted. No pneumothorax or large pleural effusion identified. No acute bony abnormalities are present. IMPRESSION: 1. LEFT PICC line placement with tip overlying the mid-LOWER SVC 2. New diffuse bilateral airspace opacities, question edema or infection. Electronically Signed   By: Margarette Canada M.D.   On: 11/23/2021 11:49   [place] EKG  SITE RITE  Result Date: 11/22/2021 If Site Rite image not attached, placement could not be confirmed due to current cardiac rhythm.     Assessment/Plan:  INTERVAL HISTORY: patient still had Right sided PICC this am when I visited im now removed and has left sided one   Principal Problem:   Septic shock ([place]) Active Problems:   Chronic pain   Bladder cancer ([place])   Bacteremia due to Gram-negative bacteria   UTI due to Klebsiella species    William [name] is a 80 y.o. male with with bladder cancer with ileal conduit admitted initially for pyelonephritis due to Klebsiella oxytoca with bacteremia.  He separately developed Candida albicans fungemia in the setting of intra-abdominal and perinephric abscess that was drained on November 19.  #1 Candida albicans fungemia due to intra-abdominal abscess but also with PICC line that was in place while he was actively [place].  PICC on the right side was placed on the 17th the day before his cultures turn positive for Candida.  Note Candida are notorious for adhering to foreign bodies in particular plastic such as in a PICC line.  He now has had his PICC line removed but has a new 1 on the left side.  In terms of treating his fungemia it would be more ideal if he could be without any PICC lines and have a true central line holiday but this may not be doable yet.  His antifungal susceptibilities are pending.  He also should have a  dilated funduscopic exam to exclude fungal endophthalmitis.  This should be done prior to discharge  #2 Klebsiella oxytoca bacteremia: He has received sufficient antibiotics for this.  3.  Intra-abdominal abscess he had been on Zosyn and Eraxis with antibacterial therapy stopped yesterday.  4.  Swollen right arm concerning for DVT I have ordered Doppler of this arm.  I spent 41 minutes with the patient including face to face counseling of the patient regarding his fungemia intra-abdominal abscess possible DVT along with personally reviewing chest x-ray CT scans updated blood cultures abscess cultures along with  review of medical records before and during the visit and in coordination of his care.    LOS: 14 days   Alcide Evener 11/23/2021, 12:12 PM

## 2021-11-23 NOTE — Progress Notes (Signed)
ANTICOAGULATION CONSULT NOTE - Initial Consult  Pharmacy Consult for enoxaparin Indication: DVT of the RUE  Allergies  Allergen Reactions   Hydrocodone Other (See Comments)    Causes constipation even with stool softner    Patient Measurements: Height: 5\' 7"  (170.2 cm) Weight: 81.9 kg (180 lb 8.9 oz) IBW/kg (Calculated) : 66.1  Vital Signs: Temp: 97.8 F (36.6 C) (11/24 1217) Temp Source: Oral (11/24 0820) BP: 154/66 (11/24 1400) Pulse Rate: 97 (11/24 1400)  Labs: Recent Labs    11/21/21 0515 11/21/21 1955 11/22/21 0500 11/23/21 0400  HGB 7.7* 9.7* 9.7* 9.0*  HCT 24.4* 30.0* 29.7* 27.7*  PLT 315 315 308 315  CREATININE 2.45*  2.48*  --  1.84* 1.41*    Estimated Creatinine Clearance: 43.5 mL/min (A) (by C-G formula based on SCr of 1.41 mg/dL (H)).   Medical History: Past Medical History:  Diagnosis Date   Bladder cancer ([place])    Pt says small tumor not cancer   Bleeding gums 11/11/2015   COVID 09/23/2020   back ache hurt all over x few days all symptoms reolved   Dyslipidemia (high LDL; low HDL)    Low back pain 12/27/2014   Medial epicondylitis of right elbow 12/13/2014   Numbness and tingling of right leg    Tinnitus    all the time   Toenail fungus    2 big toes   Wears glasses     Medications: Not on anticoagulants PTA -Currently prescribed heparin subQ for DVT ppx inpatient  Assessment: Pt is a 2 yoM admitted with septic shock and SBO. PICC line was initially placed on 11/17; pt had right arm swelling so upper extremity venous doppler ordered showing acute DVT of right brachial veins. Pharmacy has been consulted to dose therapeutic enoxaparin.   Note that patient had AKI during admission - renal function is improved. Nephrology signed off today. Hgb remains below baseline of ~13; was transfused on 11/19 and 11/22.   Today, 11/23/21 SCr 1.41 - remains elevated, but improving. CrCl ~45 mL/min CBC: Hgb (9) remains low but fairly stable s/p  transfusion. Plt WNL Last dose of subcutaneous heparin given 11/24 @ 1440. Confirmed with RN no signs of active bleeding.  Goal of Therapy:  Monitor platelets by anticoagulation protocol: Yes   Plan:  Enoxaparin 1 mg/kg (80 mg) subQ q12h Monitor renal function, CBC Monitor for signs of bleeding Follow along for eventual transition to PO anticoagulation  Lenis Noon, PharmD 11/23/2021,4:11 PM

## 2021-11-23 NOTE — Progress Notes (Signed)
[facility] Progress Note  Subjective: 4200 of UOP and crt down to 1.4  SBP has been greater than 120.  No fever  Vitals:   11/23/21 1000 11/23/21 1100 11/23/21 1200 11/23/21 1217  BP: (!) 135/59  (!) 152/61   Pulse: (!) 104 97 93   Resp: (!) 26 (!) 24 (!) 23   Temp:    97.8 F (36.6 C)  TempSrc:      SpO2: 96% 94% 96%   Weight:      Height:        Exam: Pretty somnolent today , says hiccups come and go  No jvd or bruits Chest clear bilat to bases, no rales/ wheezing RRR no MRG Abd soft ntnd no mass or ascites +bs Ext diffuse 2+ bilat UE and LE edema Neuro is lethargic, nonfocal and responds appropriately, O x 3   Home meds include - advil, neurontin, percocet prn, zanaflex, spiriva, flonase, prns     Baseline creat from this admission is 0.82- 1.07, eGFR > 60 ml/min.     UA 11/10 - cloudy amber, large H/ LE, 100 prot, many bact, >50 rbc/ wbc   UNa 43, UCr 48     CT kidneys > Adrenals/Urinary Tract: Adrenal glands are unremarkable. Status post cystoprostatectomy and right lower quadrant ileal conduit urinary diversion. Mild bilateral hydronephrosis. Bilateral double-J ureteral stents in position. The left renal pelvic pigtail is incompletely formed (series 2, image 76).    CXR  11/18 - Mild right lower lobe airspace disease unchanged. Improvement in left lower lobe airspace disease and small left effusion. Negative for pulmonary edema    Assessment/ Plan: AKI - b/l creat 0.82- 1.07, eGFR > 60 ml/min from this admission. SP cystectomy/ ileal conduit surgery on 10/26/21. Then admitted here 11/10 w/ urosepsis d/t Klebsiella. Creat was 3 on admit 11/10, improved to 0.8 and then increased up to 1.7 on 11/18. UA from 11/10 and 11/18 c/w infection. Urine lytes c/w ATN or euvolemia. Imaging showed chronic bilat hydro and bilat ureteral stents, cystoprostatectomy and ileal conduit. Nonoliguric AKI likely due to sepsis/ abscess.   Abdominal abscess drained per IR 11/19.  Creat improved quite a bit on 11/21 and UOP very good, felt we were in the home stretch as far as his AKI but crt went up again on 11/22 but down again the last 48 hours, feel again we are in the home stretch. Cont rx for sepsis/ infection.  Klebsiella/ candida bacteremia - getting IV zosyn and eraxis now.  Vol overload - feel like he will autodiurese as his kidney function improves-  but could use lasix if felt needed -- not now because do not want to lower pressure or K  SP recent cystectomy w/ ileal conduit - on 10/27, for bladder cancer Nutrition - getting TPN Hypokalemia - getting replaced-  IV ordered for today- K of 3.2.-  also adjust in TPN-  starting diet  Anemia-  transfused 2 units 11/22-  hoping it would help BP and K  Renal function improved-  feel it will continue to improve now-  renal will sign off- call with questions        William [name]  11/23/2021, 12:27 PM   Recent Labs  Lab 11/21/21 0515 11/21/21 1955 11/22/21 0500 11/23/21 0400  K 3.3*  3.2*  --  3.2* 3.2*  BUN 26*  27*  --  23 17  CREATININE 2.45*  2.48*  --  1.84* 1.41*  CALCIUM 7.2*  7.1*  --  7.4* 7.7*  PHOS 3.5  --   --  2.7  HGB 7.7*   < > 9.7* 9.0*   < > = values in this interval not displayed.   Inpatient medications:  (feeding supplement) PROSource Plus  30 mL Oral BID BM   chlorhexidine  15 mL Mouth Rinse BID   Chlorhexidine Gluconate Cloth  6 each Topical Daily   feeding supplement  237 mL Oral TID BM   fluticasone  2 spray Each Nare Daily   heparin  5,000 Units Subcutaneous Q8H   insulin aspart  0-9 Units Subcutaneous Q6H   lip balm  1 application Topical BID   magic mouthwash w/lidocaine  10 mL Oral QID   mouth rinse  15 mL Mouth Rinse q12n4p   multivitamin with minerals  1 tablet Oral Daily   nystatin  5 mL Oral QID   nystatin   Topical BID   QUEtiapine  12.5 mg Oral QHS   saccharomyces boulardii  250 mg Oral BID   sodium chloride flush  10-40 mL Intracatheter Q12H    sodium chloride flush  10-40 mL Intracatheter Q12H   sodium chloride flush  5 mL Intracatheter Q8H   traZODone  50 mg Oral QHS   umeclidinium bromide  1 puff Inhalation Daily    sodium chloride 75 mL/hr at 11/14/21 2328   sodium chloride Stopped (11/21/21 1838)   albumin human Stopped (11/23/21 8329)   anidulafungin Stopped (11/23/21 1129)   chlorproMAZINE (THORAZINE) IV Stopped (11/22/21 0414)   methocarbamol (ROBAXIN) IV Stopped (11/20/21 2252)   ondansetron (ZOFRAN) IV     piperacillin-tazobactam (ZOSYN)  IV Stopped (11/23/21 1139)   potassium chloride 10 mEq (11/23/21 1223)   sodium chloride, acetaminophen, chlorproMAZINE (THORAZINE) IV, diphenhydrAMINE, guaiFENesin, hydrALAZINE, HYDROmorphone (DILAUDID) injection, magic mouthwash, menthol-cetylpyridinium, methocarbamol (ROBAXIN) IV, metoprolol tartrate, ondansetron (ZOFRAN) IV **OR** ondansetron (ZOFRAN) IV, phenol, senna-docusate, sodium chloride flush, sodium chloride flush

## 2021-11-23 NOTE — Progress Notes (Signed)
PROGRESS NOTE    William [name]  ELF:[id number] DOB: 10-19-1941 DOA: 11/26/2021 PCP: [name], MD   Brief Narrative:  80 year old with history of bladder cancer status post robotic assisted laparoscopic radical cystectomy/prostatectomy on 10/25/2021 by [name], bilateral pelvic lymph node dissection, extensive laparoscopic DCO lysis, small bowel resection with ileal conduit, urinary diversion on 10/26/2021, COPD, tobacco use initially discharged on 11/02/2021 but returned back to the hospital 10/31/2021 due to hypotension.  Patient was found to be in septic shock secondary to urinary tract infection from multiple species requiring vasopressors and was admitted to the ICU.  Eventually patient was transferred to ICU on 11/11/2021.  Hospital course was complicated by ileus/partial small bowel obstruction therefore general surgery was consulted.  This was conservatively managed.  Initially in renal function worsened but thereafter it slowly improved.  Repeat CT scan showed large intra-abdominal abscess 25.5 centimeters the cultures grew Candida albicans.  Patient was started on IV Diflucan, ID was Consulted and was switched to Eraxis.  IR drained intra-abdominal abscess on 11/19.  Also received PRBC transfusion during hospitalization.   Assessment & Plan:   Principal Problem:   Septic shock ([place]) Active Problems:   Chronic pain   Bladder cancer ([place])   Bacteremia due to Gram-negative bacteria   UTI due to Klebsiella species  Septic shock, sepsis criteria is improving, no longer on vasopressor, secondary to Klebsiella oxytoca/Enterococcus faecalis UTI/Klebsiella oxytoca bacteremia, newly diagnosed Candida albicans fungemia Intra-abdominal abscess -Shock physiology has resolved.  Last day of Zosyn 11/22/2021.  ID is following.  Continue to monitor culture data. - Previous blood cultures 11/10-grew Klebsiella, urine cultures grew Enterococcus faecalis/Klebsiella - Blood cultures 11/18  grew Candida albicans -Benign eye exam, completed by ophthalmology [name] Close on 11/19/2021.  No evidence of fungal endophthalmitis.  Intra-abdominal abscess seen on CT scan measuring 25.5 cm  -Status post image guided drain placement left lower quadrant by interventional radiology [name] on 11/18/2021. - Currently on IV Eraxis.  Zosyn stopped 11/23. - IR following for output  Small bowel obstruction, now ileus/partial small bowel obstruction. -Initially noted on 11/10 on CT abdomen/pelvis, NG tube placed which was removed on 11/15.  Currently on dysphagia 3 diet.  TPN stopped due to fungemia.   Acute blood loss anemia -Baseline hemoglobin 13.  Required multiple PRBC transfusion 1U 11/19; 2U 11/22   Intermittent delirium -Secondary to underlying illness.  Getting bedtime Seroquel and trazodone.  Delirium protocol   Anasarca with hypoalbuminemia -Albumin supplements ordered   Acute hypoxic respiratory failure secondary to bilateral pleural effusions Acute diastolic congestive heart failure with bilateral pleural effusions -As needed oxygen as needed, I-S/flutter valve.  Bronchodilators.  Out of bed to chair   Bilateral pleural effusion secondary to volume overload -Difficult to diurese the patient due to intermittent hypotension.  Partly driven by hypoalbuminemia as well and immobility.  We will place him on albumin.   Sinus tachycardia suspect related to sepsis and ongoing lung physiology -Slightly better but this is secondary to underlying pulmonary condition, generalized deconditioning and to an extent dehydration.  IV Lopressor as needed   Nonoliguric acute kidney injury -Baseline creatinine around 0.8.  Slowly trended up and peaked at 2.58 > 1.84> 1.41.  Creatinine today is 1.84 creatinine today pending   Improving severe left flank/back pain secondary to intra-abdominal large abscess seen on CT scan. -As needed pain control  Oral thrush -Already on Eraxis, Magic mouthwash  with lidocaine   Refractory hypokalemia post repletion -As needed repletion of  electrolytes   Bladder cancer s/p radical cystectomy, prostatectomy and ileal conduit by urology [name] on 10/25/2021, and 10/26/2021. Underwent robotic assisted laparoscopic radical cystectomy, robotic radical prostatectomy, bilateral pelvic lymph node dissection, extensive laparoscopic adhesiolysis, small bowel resection and ileal conduit urinary diversion on 10/25/2021 and on 10/26/2021 by [name]. -Urology team following   COPD History of tobacco abuse -Continue Incruse, as needed bronchodilators, I-S/flutter.   Suspected ILD Noted on CT imaging this admission.  History of tobacco abuse. --Flonase --Outpatient pulmonology follow-up   Weakness/deconditioning/debility: PT/OT- HH   Severe protein calorie malnutrition. -Encourage oral intake    DVT prophylaxis: Subcutaneous heparin Code Status: Full code Family Communication: Wife at bedside, also spoke with son over the phone  Status is: Inpatient  Remains inpatient appropriate because: Multiple ongoing issues as mentioned above, not ready for discharge.  Patient will need to be cleared by multiple services including ID, nephrology and urology prior to discharge  Nutritional status  Nutrition Problem: Inadequate oral intake Etiology: decreased appetite  Signs/Symptoms: per patient/family report  Interventions: Ensure Enlive (each supplement provides 350kcal and 20 grams of protein), Prostat, MVI  Body mass index is 28.28 kg/m.           Subjective: Patient does not have any new complaints.  Overall appears very weak.  He understands he needs to get out of bed to chair as frequently as possible  Review of Systems Otherwise negative except as per HPI, including: General: Denies fever, chills, night sweats or unintended weight loss. Resp: Denies cough, wheezing, shortness of breath. Cardiac: Denies chest pain, palpitations,  orthopnea, paroxysmal nocturnal dyspnea. GI: Denies abdominal pain, nausea, vomiting, diarrhea or constipation GU: Denies dysuria, frequency, hesitancy or incontinence MS: Denies muscle aches, joint pain or swelling Neuro: Denies headache, neurologic deficits (focal weakness, numbness, tingling), abnormal gait Psych: Denies anxiety, depression, SI/HI/AVH Skin: Denies new rashes or lesions ID: Denies sick contacts, exotic exposures, travel  Examination: Constitutional: Not in acute distress, chronically ill, cachectic frail with bilateral temporal wasting Respiratory: Clear to auscultation bilaterally Cardiovascular: Slight sinus tachycardia with heart rate of 108 Abdomen: Nontender nondistended good bowel sounds Musculoskeletal: No edema noted Skin: No rashes seen Neurologic: CN 2-12 grossly intact.  And nonfocal Psychiatric: Normal judgment and insight. Alert and oriented x 3. Normal mood.  PICC line in place Drain in place Foley catheter in place  Objective: Vitals:   11/23/21 0300 11/23/21 0400 11/23/21 0500 11/23/21 0600  BP: (!) 131/47 (!) 123/55 127/66 137/65  Pulse: (!) 108 (!) 110 95 (!) 104  Resp: (!) 24 (!) 25 (!) 25 20  Temp:  98.1 F (36.7 C)    TempSrc:  Axillary    SpO2: 96% 94% 94% 95%  Weight:   81.9 kg   Height:        Intake/Output Summary (Last 24 hours) at 11/23/2021 0743 Last data filed at 11/23/2021 0600 Gross per 24 hour  Intake 1213.77 ml  Output 4600 ml  Net -3386.23 ml   Filed Weights   11/21/21 0500 11/22/21 0500 11/23/21 0500  Weight: 91.2 kg 91.3 kg 81.9 kg     Data Reviewed:   CBC: Recent Labs  Lab 11/16/21 1201 11/17/21 0521 11/20/21 0800 11/21/21 0515 11/21/21 1955 11/22/21 0500 11/23/21 0400  WBC 20.7*   < > 8.1 8.4 9.3 9.2 8.3  NEUTROABS 17.7*  --   --  6.4  --  7.3 6.3  HGB 8.5*   < > 8.0* 7.7* 9.7* 9.7* 9.0*  HCT  26.9*   < > 25.1* 24.4* 30.0* 29.7* 27.7*  MCV 93.1   < > 91.3 92.1 90.6 90.8 91.1  PLT 314   < > 315  315 315 308 315   < > = values in this interval not displayed.   Basic Metabolic Panel: Recent Labs  Lab 11/17/21 0521 11/18/21 0158 11/18/21 1928 11/19/21 0358 11/20/21 0540 11/21/21 0515 11/22/21 0500 11/23/21 0400  NA 135 135  --  136 135 136  136 137 141  K 3.6 3.6  --  3.5 3.0* 3.3*  3.2* 3.2* 3.2*  CL 104 104  --  105 104 107  106 109 118*  CO2 24 24  --  23 21* 22  22 21* 20*  GLUCOSE 126* 119*  --  73 85 89  89 92 89  BUN 22 31*  --  33* 24* 26*  27* 23 17  CREATININE 1.74* 2.56*  --  2.58* 1.67* 2.45*  2.48* 1.84* 1.41*  CALCIUM 7.4* 7.2*  --  7.0* 7.3* 7.2*  7.1* 7.4* 7.7*  MG 2.1 2.0  --   --  1.8 1.9  --  2.2  PHOS 3.0  --  4.5 4.7* 2.9 3.5  --  2.7   GFR: Estimated Creatinine Clearance: 43.5 mL/min (A) (by C-G formula based on SCr of 1.41 mg/dL (H)). Liver Function Tests: Recent Labs  Lab 11/19/21 0358 11/20/21 0540 11/21/21 0515 11/22/21 0500 11/23/21 0400  AST 26 26 27 26 19   ALT 15 16 16 15 12   ALKPHOS 74 78 67 83 71  BILITOT 0.7 0.7 0.7 1.0 1.0  PROT 4.8* 5.7* 5.2* 5.5* 5.8*  ALBUMIN 1.7* 1.6* 1.5*  <1.5* <1.5* 2.3*   Recent Labs  Lab 11/17/21 1020  LIPASE 53*   No results for input(s): AMMONIA in the last 168 hours. Coagulation Profile: Recent Labs  Lab 11/18/21 1928  INR 1.3*   Cardiac Enzymes: No results for input(s): CKTOTAL, CKMB, CKMBINDEX, TROPONINI in the last 168 hours. BNP (last 3 results) No results for input(s): PROBNP in the last 8760 hours. HbA1C: No results for input(s): HGBA1C in the last 72 hours. CBG: Recent Labs  Lab 11/22/21 0743 11/22/21 1132 11/22/21 1750 11/22/21 2310 11/23/21 0601  GLUCAP 80 93 86 83 82   Lipid Profile: No results for input(s): CHOL, HDL, LDLCALC, TRIG, CHOLHDL, LDLDIRECT in the last 72 hours.  Thyroid Function Tests: Recent Labs    11/20/21 1320  TSH 1.262   Anemia Panel: No results for input(s): VITAMINB12, FOLATE, FERRITIN, TIBC, IRON, RETICCTPCT in the last 72  hours. Sepsis Labs: Recent Labs  Lab 11/16/21 1201 11/17/21 0521 11/17/21 1020 11/18/21 0229  PROCALCITON  --  0.74  --   --   LATICACIDVEN 1.0  --  2.3* 1.0    Recent Results (from the past 240 hour(s))  Culture, blood (routine x 2)     Status: Abnormal (Preliminary result)   Collection Time: 11/17/21  5:10 AM   Specimen: BLOOD  Result Value Ref Range Status   Specimen Description   Final    BLOOD BLOOD LEFT HAND Performed at [facility], [place] [address] [place], [place] [address]    Special Requests   Final    BOTTLES DRAWN AEROBIC ONLY Blood Culture adequate volume Performed at [place] [address] [place], [place] [address]    Culture  Setup Time   Final    YEAST AEROBIC BOTTLE ONLY CRITICAL VALUE NOTED.  VALUE IS CONSISTENT WITH PREVIOUSLY REPORTED AND CALLED VALUE. Performed at [facility], [place] [address] [place], [place] [address]    Culture CANDIDA ALBICANS (A)  Final   Report Status PENDING  Incomplete  Culture, blood (routine x 2)     Status: Abnormal (Preliminary result)   Collection Time: 11/17/21  5:20 AM   Specimen: BLOOD  Result Value Ref Range Status   Specimen Description   Final    BLOOD BLOOD LEFT WRIST Performed at [facility] [address] [place], [place] [address]    Special Requests   Final    BOTTLES DRAWN AEROBIC ONLY Blood Culture adequate volume Performed at [facility] [address], [place], [place] [address]    Culture  Setup Time   Final    YEAST AEROBIC BOTTLE ONLY CRITICAL RESULT CALLED TO, READ BACK BY AND VERIFIED WITH: A PHAM PHARMD @0821  11/18/21 EB    Culture (A)  Final    CANDIDA ALBICANS Sent to [facility] for further susceptibility testing. Performed at [facility], [place] [address], [place],  [address]    Report Status PENDING  Incomplete  Blood Culture ID Panel (Reflexed)     Status: Abnormal   Collection  Time: 11/17/21  5:20 AM  Result Value Ref Range Status   Enterococcus faecalis NOT DETECTED NOT DETECTED Final   Enterococcus Faecium NOT DETECTED NOT DETECTED Final   Listeria monocytogenes NOT DETECTED NOT DETECTED Final   Staphylococcus species NOT DETECTED NOT DETECTED Final   Staphylococcus aureus (BCID) NOT DETECTED NOT DETECTED Final   Staphylococcus epidermidis NOT DETECTED NOT DETECTED Final   Staphylococcus lugdunensis NOT DETECTED NOT DETECTED Final   Streptococcus species NOT DETECTED NOT DETECTED Final   Streptococcus agalactiae NOT DETECTED NOT DETECTED Final   Streptococcus pneumoniae NOT DETECTED NOT DETECTED Final   Streptococcus pyogenes NOT DETECTED NOT DETECTED Final   A.calcoaceticus-baumannii NOT DETECTED NOT DETECTED Final   Bacteroides fragilis NOT DETECTED NOT DETECTED Final   Enterobacterales NOT DETECTED NOT DETECTED Final   Enterobacter cloacae complex NOT DETECTED NOT DETECTED Final   Escherichia coli NOT DETECTED NOT DETECTED Final   Klebsiella aerogenes NOT DETECTED NOT DETECTED Final   Klebsiella oxytoca NOT DETECTED NOT DETECTED Final   Klebsiella pneumoniae NOT DETECTED NOT DETECTED Final   Proteus species NOT DETECTED NOT DETECTED Final   Salmonella species NOT DETECTED NOT DETECTED Final   Serratia marcescens NOT DETECTED NOT DETECTED Final   Haemophilus influenzae NOT DETECTED NOT DETECTED Final   Neisseria meningitidis NOT DETECTED NOT DETECTED Final   Pseudomonas aeruginosa NOT DETECTED NOT DETECTED Final   Stenotrophomonas maltophilia NOT DETECTED NOT DETECTED Final   Candida albicans DETECTED (A) NOT DETECTED Final    Comment: CRITICAL RESULT CALLED TO, READ BACK BY AND VERIFIED WITH: A PHAM PHARMD @0821  11/18/21 EB    Candida auris NOT DETECTED NOT DETECTED Final   Candida glabrata NOT DETECTED NOT DETECTED Final   Candida krusei NOT DETECTED NOT DETECTED Final   Candida parapsilosis NOT DETECTED NOT DETECTED Final   Candida tropicalis NOT  DETECTED NOT DETECTED Final   Cryptococcus neoformans/gattii NOT DETECTED NOT DETECTED Final    Comment: Performed at [facility], [address], [place],  [address]  Antifungal AST 9 Drug Panel     Status: None (Preliminary result)   Collection Time: 11/17/21  5:20 AM  Result Value Ref Range Status   Organism ID, Yeast Preliminary report  Final    Comment: (NOTE) Specimen has been received and testing has been initiated. Performed At: [facility] [place], [place] [id number] Rush Farmer MD [id number]    Amphotericin B MIC PENDING  Incomplete   Anidulafungin MIC PENDING  Incomplete   Caspofungin MIC PENDING  Incomplete   Micafungin MIC PENDING  Incomplete   Posaconazole MIC PENDING  Incomplete   Fluconazole Islt MIC PENDING  Incomplete   Flucytosine MIC PENDING  Incomplete   Itraconazole MIC PENDING  Incomplete   Voriconazole MIC PENDING  Incomplete   Source [id number] SORUCE BLD C. ALBICANS SENSI  Final    Comment: Performed at [facility], [place] [address] [place], [place] [address]  MRSA Next Gen by PCR, Nasal     Status: None   Collection Time: 11/17/21  6:24 PM   Specimen: Nasal Mucosa; Nasal Swab  Result Value Ref Range Status   MRSA by PCR Next Gen NOT DETECTED NOT DETECTED Final    Comment: (NOTE) The GeneXpert MRSA Assay (FDA approved for NASAL specimens only), is one component of a comprehensive MRSA colonization surveillance program. It is not intended to diagnose MRSA infection nor to guide or monitor treatment for MRSA infections. Test performance is not FDA approved in patients less than 4 years old. Performed at [facility], [place] [address], [place], [place] [address]   C Difficile Quick Screen (NO PCR Reflex)     Status: None   Collection Time: 11/17/21  8:40 PM   Specimen: STOOL  Result Value Ref Range Status   C Diff antigen NEGATIVE NEGATIVE Final   C Diff toxin NEGATIVE NEGATIVE Final   C  Diff interpretation NEGATIVE  Final    Comment: Performed at [facility], [place] [address], [place], [place] [address]  Aerobic/Anaerobic Culture w Gram Stain (surgical/deep wound)     Status: None (Preliminary result)   Collection Time: 11/18/21 11:01 AM   Specimen: Abdomen; Abscess  Result Value Ref Range Status   Specimen Description   Final    ABDOMEN Performed at [facility] [address] [place], [place] [address]    Special Requests   Final    ABSCES Performed at [facility], [place] [address], [place], [place] [address]    Gram Stain   Final    MODERATE WBC PRESENT,BOTH PMN AND MONONUCLEAR NO ORGANISMS SEEN Performed at [facility], [place] [address] [place],  [address]    Culture RARE ESCHERICHIA COLI  Final   Report Status PENDING  Incomplete   Organism ID, Bacteria ESCHERICHIA COLI  Final      Susceptibility   Escherichia coli - MIC*    AMPICILLIN <=2 SENSITIVE Sensitive     CEFAZOLIN <=4 SENSITIVE Sensitive     CEFEPIME <=0.12 SENSITIVE Sensitive     CEFTAZIDIME <=1 SENSITIVE Sensitive     CEFTRIAXONE <=0.25 SENSITIVE Sensitive     CIPROFLOXACIN <=0.25 SENSITIVE Sensitive     GENTAMICIN <=1 SENSITIVE Sensitive     IMIPENEM <=0.25 SENSITIVE Sensitive     TRIMETH/SULFA <=20 SENSITIVE Sensitive     AMPICILLIN/SULBACTAM <=2 SENSITIVE Sensitive     PIP/TAZO <=4 SENSITIVE Sensitive     * RARE ESCHERICHIA COLI  Fungus Stain     Status: None   Collection Time: 11/18/21 11:01 AM   Specimen: Abscess  Result Value Ref Range Status   FUNGUS STAIN Final report  Final    Comment: (NOTE)  Performed At: [facility] [place], [place] [id number] Rush Farmer MD [id number]    Fungal Source ABSCESS  Final    Comment: Performed at [facility], [place] [address] [place], [place] [address]  Fungal Stain reflex     Status: None   Collection Time: 11/18/21 11:01 AM  Result Value  Ref Range Status   Fungal stain result 1 Comment  Final    Comment: (NOTE) KOH/Calcofluor preparation:  no fungus observed. Performed At: [facility] [place], [place] [id number] Rush Farmer MD [id number]   Culture, fungus without smear     Status: Abnormal (Preliminary result)   Collection Time: 11/18/21 11:02 AM   Specimen: Abscess; Other  Result Value Ref Range Status   Specimen Description   Final    ABSCESS Performed at [facility] [address] [place], [place] [address]    Special Requests   Final    ABDOMEN Performed at [facility], [place] [address] [place], [place] [address]    Culture CANDIDA ALBICANS (A)  Final   Report Status PENDING  Incomplete  Urine Culture     Status: Abnormal   Collection Time: 11/18/21  3:03 PM   Specimen: Urine, Clean Catch  Result Value Ref Range Status   Specimen Description   Final    URINE, CLEAN CATCH Performed at [facility], [place] [address] [place], [place] [address]    Special Requests   Final    NONE Performed at [facility], [place] [address], [place], [place] [address]    Culture (A)  Final    <10,000 COLONIES/mL INSIGNIFICANT GROWTH Performed at [place] [address] [place], [place] [address]    Report Status 11/20/2021 FINAL  Final  Culture, blood (routine x 2)     Status: None (Preliminary result)   Collection Time: 11/18/21  7:27 PM   Specimen: BLOOD  Result Value Ref Range Status   Specimen Description   Final    BLOOD BLOOD LEFT WRIST Performed at [place] [address], [place], [place] [address]    Special Requests   Final    BOTTLES DRAWN AEROBIC ONLY Blood Culture adequate volume Performed at [facility] [address], [place], [place] [address]    Culture   Final    NO GROWTH 3 DAYS Performed at [facility], [place] [address] [place], [place]  [address]    Report Status PENDING  Incomplete  Fungus culture, blood     Status: None (Preliminary result)   Collection Time: 11/18/21  7:28 PM   Specimen: BLOOD  Result Value Ref Range Status   Specimen Description   Final    BLOOD BLOOD LEFT HAND Performed at [place] [address], [place], [place] [address]    Special Requests   Final    BOTTLES DRAWN AEROBIC ONLY Blood Culture adequate volume Performed at [place] [address] [place], [place] [address]    Culture   Final    NO GROWTH 3 DAYS Performed at [facility], [place] [address], [place],  [address]    Report Status PENDING  Incomplete  Culture, blood (routine x 2)     Status: None (Preliminary result)   Collection Time: 11/18/21  7:28 PM   Specimen: BLOOD  Result Value Ref Range Status   Specimen Description  Final    BLOOD BLOOD LEFT HAND Performed at [facility] [address], [place], [place] [address]    Special Requests   Final    BOTTLES DRAWN AEROBIC ONLY Blood Culture adequate volume Performed at [place] [address] [place], [place] [address]    Culture   Final    NO GROWTH 3 DAYS Performed at [facility], [place] [address], [place], [place] [address]    Report Status PENDING  Incomplete         Radiology Studies: [place] EKG SITE RITE  Result Date: 11/22/2021 If Site Rite image not attached, placement could not be confirmed due to current cardiac rhythm.       Scheduled Meds:  (feeding supplement) PROSource Plus  30 mL Oral BID BM   chlorhexidine  15 mL Mouth Rinse BID   Chlorhexidine Gluconate Cloth  6 each Topical Daily   feeding supplement  237 mL Oral TID BM   fluticasone  2 spray Each Nare Daily   heparin  5,000 Units Subcutaneous Q8H   insulin aspart  0-9 Units Subcutaneous Q6H   lip balm  1 application Topical BID   magic mouthwash w/lidocaine  10 mL Oral QID   mouth  rinse  15 mL Mouth Rinse q12n4p   multivitamin with minerals  1 tablet Oral Daily   nystatin  5 mL Oral QID   nystatin   Topical BID   QUEtiapine  12.5 mg Oral QHS   saccharomyces boulardii  250 mg Oral BID   sodium chloride flush  10-40 mL Intracatheter Q12H   sodium chloride flush  10-40 mL Intracatheter Q12H   sodium chloride flush  5 mL Intracatheter Q8H   traZODone  50 mg Oral QHS   umeclidinium bromide  1 puff Inhalation Daily   Continuous Infusions:  sodium chloride 75 mL/hr at 11/14/21 2328   sodium chloride Stopped (11/21/21 1838)   albumin human 25 g (11/23/21 0601)   anidulafungin Stopped (11/22/21 1220)   chlorproMAZINE (THORAZINE) IV Stopped (11/22/21 0414)   methocarbamol (ROBAXIN) IV Stopped (11/20/21 2252)   ondansetron (ZOFRAN) IV     piperacillin-tazobactam (ZOSYN)  IV 3.375 g (11/23/21 0736)     LOS: 14 days   Time spent= 35 mins    Nikoli Nasser Arsenio Loader, MD [facility]  If 7PM-7AM, please contact night-coverage  11/23/2021, 7:43 AM

## 2021-11-24 ENCOUNTER — Inpatient Hospital Stay (HOSPITAL_COMMUNITY): Payer: Medicare HMO

## 2021-11-24 DIAGNOSIS — M7989 Other specified soft tissue disorders: Secondary | ICD-10-CM

## 2021-11-24 DIAGNOSIS — C679 Malignant neoplasm of bladder, unspecified: Secondary | ICD-10-CM | POA: Diagnosis not present

## 2021-11-24 DIAGNOSIS — R7881 Bacteremia: Secondary | ICD-10-CM | POA: Diagnosis not present

## 2021-11-24 DIAGNOSIS — R6521 Severe sepsis with septic shock: Secondary | ICD-10-CM | POA: Diagnosis not present

## 2021-11-24 DIAGNOSIS — A419 Sepsis, unspecified organism: Secondary | ICD-10-CM | POA: Diagnosis not present

## 2021-11-24 DIAGNOSIS — R579 Shock, unspecified: Secondary | ICD-10-CM

## 2021-11-24 DIAGNOSIS — K56609 Unspecified intestinal obstruction, unspecified as to partial versus complete obstruction: Secondary | ICD-10-CM

## 2021-11-24 DIAGNOSIS — N179 Acute kidney failure, unspecified: Secondary | ICD-10-CM | POA: Diagnosis not present

## 2021-11-24 LAB — CBC WITH DIFFERENTIAL/PLATELET
Abs Immature Granulocytes: 0.24 10*3/uL — ABNORMAL HIGH (ref 0.00–0.07)
Basophils Absolute: 0.1 10*3/uL (ref 0.0–0.1)
Basophils Relative: 1 %
Eosinophils Absolute: 0.2 10*3/uL (ref 0.0–0.5)
Eosinophils Relative: 2 %
HCT: 29.4 % — ABNORMAL LOW (ref 39.0–52.0)
Hemoglobin: 9.3 g/dL — ABNORMAL LOW (ref 13.0–17.0)
Immature Granulocytes: 2 %
Lymphocytes Relative: 14 %
Lymphs Abs: 1.4 10*3/uL (ref 0.7–4.0)
MCH: 29.2 pg (ref 26.0–34.0)
MCHC: 31.6 g/dL (ref 30.0–36.0)
MCV: 92.2 fL (ref 80.0–100.0)
Monocytes Absolute: 0.6 10*3/uL (ref 0.1–1.0)
Monocytes Relative: 6 %
Neutro Abs: 7.8 10*3/uL — ABNORMAL HIGH (ref 1.7–7.7)
Neutrophils Relative %: 75 %
Platelets: 375 10*3/uL (ref 150–400)
RBC: 3.19 MIL/uL — ABNORMAL LOW (ref 4.22–5.81)
RDW: 15.7 % — ABNORMAL HIGH (ref 11.5–15.5)
WBC: 10.2 10*3/uL (ref 4.0–10.5)
nRBC: 0 % (ref 0.0–0.2)

## 2021-11-24 LAB — PHOSPHORUS: Phosphorus: 2.2 mg/dL — ABNORMAL LOW (ref 2.5–4.6)

## 2021-11-24 LAB — BRAIN NATRIURETIC PEPTIDE: B Natriuretic Peptide: 519.9 pg/mL — ABNORMAL HIGH (ref 0.0–100.0)

## 2021-11-24 LAB — BASIC METABOLIC PANEL
Anion gap: 7 (ref 5–15)
Anion gap: 10 (ref 5–15)
BUN: 10 mg/dL (ref 8–23)
BUN: 10 mg/dL (ref 8–23)
CO2: 23 mmol/L (ref 22–32)
CO2: 22 mmol/L (ref 22–32)
Calcium: 8.2 mg/dL — ABNORMAL LOW (ref 8.9–10.3)
Calcium: 8.6 mg/dL — ABNORMAL LOW (ref 8.9–10.3)
Chloride: 116 mmol/L — ABNORMAL HIGH (ref 98–111)
Chloride: 117 mmol/L — ABNORMAL HIGH (ref 98–111)
Creatinine, Ser: 0.88 mg/dL (ref 0.61–1.24)
Creatinine, Ser: 0.82 mg/dL (ref 0.61–1.24)
GFR, Estimated: >60 mL/min (ref >60)
GFR, Estimated: >60 mL/min (ref >60)
Glucose, Bld: 106 mg/dL — ABNORMAL HIGH (ref 70–99)
Glucose, Bld: 107 mg/dL — ABNORMAL HIGH (ref 70–99)
Potassium: 2.8 mmol/L — ABNORMAL LOW (ref 3.5–5.1)
Potassium: 3.1 mmol/L — ABNORMAL LOW (ref 3.5–5.1)
Sodium: 146 mmol/L — ABNORMAL HIGH (ref 135–145)
Sodium: 149 mmol/L — ABNORMAL HIGH (ref 135–145)

## 2021-11-24 LAB — CULTURE, BLOOD (ROUTINE X 2)
Culture: NO GROWTH
Special Requests: ADEQUATE

## 2021-11-24 LAB — PREALBUMIN: Prealbumin: <5 mg/dL — ABNORMAL LOW (ref 18–38)

## 2021-11-24 LAB — GLUCOSE, CAPILLARY
Glucose-Capillary: 109 mg/dL — ABNORMAL HIGH (ref 70–99)
Glucose-Capillary: 114 mg/dL — ABNORMAL HIGH (ref 70–99)
Glucose-Capillary: 122 mg/dL — ABNORMAL HIGH (ref 70–99)
Glucose-Capillary: 95 mg/dL (ref 70–99)

## 2021-11-24 LAB — MAGNESIUM
Magnesium: 1.9 mg/dL (ref 1.7–2.4)
Magnesium: 1.8 mg/dL (ref 1.7–2.4)

## 2021-11-24 MED ORDER — OSMOLITE 1.5 CAL PO LIQD
1000.0000 mL | ORAL | Status: AC
  Administered 2021-11-24: 1000 mL
  Filled 2021-11-24: qty 1000

## 2021-11-24 MED ORDER — MAGIC MOUTHWASH: 15.0000 mL | Freq: Four times a day (QID) | ORAL | Status: DC | PRN

## 2021-11-24 MED ORDER — POTASSIUM CHLORIDE 20 MEQ PO PACK: 40.0000 meq | PACK | ORAL | Status: DC

## 2021-11-24 MED ORDER — VITAL HIGH PROTEIN PO LIQD: 1000.0000 mL | ORAL | Status: DC

## 2021-11-24 MED ORDER — FREE WATER
30.0000 mL | Freq: Four times a day (QID) | Status: DC
  Administered 2021-11-25 – 2021-11-26 (×4): 30 mL

## 2021-11-24 MED ORDER — ADULT MULTIVITAMIN LIQUID CH
15.0000 mL | Freq: Every day | ORAL | Status: DC
  Administered 2021-11-25 – 2021-11-29 (×5): 15 mL
  Filled 2021-11-24 (×5): qty 15

## 2021-11-24 MED ORDER — QUETIAPINE 12.5 MG HALF TABLET
12.5000 mg | ORAL_TABLET | Freq: Every day | ORAL | Status: DC
  Administered 2021-11-24 – 2021-11-28 (×5): 12.5 mg
  Filled 2021-11-24 (×6): qty 1

## 2021-11-24 MED ORDER — THIAMINE HCL 100 MG/ML IJ SOLN
100.0000 mg | Freq: Every day | INTRAMUSCULAR | Status: DC
  Administered 2021-11-24 – 2021-11-29 (×6): 100 mg via INTRAVENOUS
  Filled 2021-11-24 (×6): qty 2

## 2021-11-24 MED ORDER — SACCHAROMYCES BOULARDII 250 MG PO CAPS
250.0000 mg | ORAL_CAPSULE | Freq: Two times a day (BID) | ORAL | Status: DC
Start: 2021-11-24 — End: 2021-11-26
  Administered 2021-11-24 – 2021-11-26 (×4): 250 mg
  Filled 2021-11-24 (×4): qty 1

## 2021-11-24 MED ORDER — SENNOSIDES 8.8 MG/5ML PO SYRP
5.0000 mL | ORAL_SOLUTION | Freq: Every evening | ORAL | Status: DC | PRN
  Filled 2021-11-24: qty 5

## 2021-11-24 MED ORDER — METOPROLOL TARTRATE 5 MG/5ML IV SOLN
5.0000 mg | INTRAVENOUS | Status: DC | PRN
  Administered 2021-11-24 – 2021-11-29 (×14): 5 mg via INTRAVENOUS
  Filled 2021-11-24 (×15): qty 5

## 2021-11-24 MED ORDER — TRAZODONE HCL 50 MG PO TABS
50.0000 mg | ORAL_TABLET | Freq: Every day | ORAL | Status: DC
  Administered 2021-11-24 – 2021-11-28 (×5): 50 mg
  Filled 2021-11-24 (×5): qty 1

## 2021-11-24 MED ORDER — FUROSEMIDE 10 MG/ML IJ SOLN
20.0000 mg | Freq: Four times a day (QID) | INTRAMUSCULAR | Status: AC
  Administered 2021-11-24 (×2): 20 mg via INTRAVENOUS
  Filled 2021-11-24 (×2): qty 2

## 2021-11-24 MED ORDER — ALPRAZOLAM 0.5 MG PO TABS
0.5000 mg | ORAL_TABLET | Freq: Three times a day (TID) | ORAL | Status: DC | PRN
  Administered 2021-11-24 (×3): 0.5 mg via ORAL
  Filled 2021-11-24 (×4): qty 1

## 2021-11-24 MED ORDER — POTASSIUM CHLORIDE 10 MEQ/100ML IV SOLN
10.0000 meq | INTRAVENOUS | Status: AC
  Administered 2021-11-24 (×4): 10 meq via INTRAVENOUS
  Filled 2021-11-24 (×2): qty 100

## 2021-11-24 MED ORDER — PROSOURCE TF PO LIQD
45.0000 mL | Freq: Two times a day (BID) | ORAL | Status: DC
  Administered 2021-11-24 – 2021-11-29 (×12): 45 mL
  Filled 2021-11-24 (×12): qty 45

## 2021-11-24 MED ORDER — NYSTATIN 100000 UNIT/ML MT SUSP
5.0000 mL | Freq: Four times a day (QID) | OROMUCOSAL | Status: DC
Start: 2021-11-24 — End: 2021-11-25

## 2021-11-24 MED ORDER — ALPRAZOLAM 0.5 MG PO TABS
0.5000 mg | ORAL_TABLET | Freq: Three times a day (TID) | ORAL | Status: DC | PRN
  Administered 2021-11-25 – 2021-11-28 (×7): 0.5 mg
  Filled 2021-11-24 (×8): qty 1

## 2021-11-24 MED ORDER — OSMOLITE 1.5 CAL PO LIQD
1000.0000 mL | ORAL | Status: DC
  Administered 2021-11-25 – 2021-11-29 (×4): 1000 mL
  Filled 2021-11-24 (×7): qty 1000

## 2021-11-24 MED ORDER — DOCUSATE SODIUM 50 MG/5ML PO LIQD: 100.0000 mg | Freq: Every evening | ORAL | Status: DC | PRN

## 2021-11-24 MED ORDER — GUAIFENESIN 100 MG/5ML PO LIQD
5.0000 mL | ORAL | Status: DC | PRN
  Administered 2021-11-29: 5 mL
  Filled 2021-11-24: qty 10

## 2021-11-24 MED ORDER — ACETAMINOPHEN 160 MG/5ML PO SOLN
650.0000 mg | Freq: Four times a day (QID) | ORAL | Status: DC | PRN
  Administered 2021-11-28 – 2021-11-29 (×3): 650 mg
  Filled 2021-11-24 (×3): qty 20.3

## 2021-11-24 MED ORDER — ACETAMINOPHEN 650 MG RE SUPP
650.0000 mg | Freq: Once | RECTAL | Status: AC
  Administered 2021-11-24: 650 mg via RECTAL
  Filled 2021-11-24: qty 1

## 2021-11-24 MED ORDER — POTASSIUM CHLORIDE 10 MEQ/100ML IV SOLN
10.0000 meq | INTRAVENOUS | Status: AC
  Administered 2021-11-24 (×4): 10 meq via INTRAVENOUS
  Filled 2021-11-24 (×4): qty 100

## 2021-11-24 NOTE — Progress Notes (Signed)
LUE venous duplex has been completed.  Results can be found under chart review under CV PROC. 11/24/2021 1:03 PM Arvine Clayburn RVT, RDMS

## 2021-11-24 NOTE — Progress Notes (Signed)
Patient removed 1st feeding tube this nurse placed. After educating the patient he agreed he needed a feeding tube and another one was placed.   Upon entering patients room, patient had removed all monitoring cords and attempted to remove 2nd feeding tube that was placed.   Patient was further educated and understands the importance of the feeding tube, but appears to become intermittently confused   Will continue to monitor.

## 2021-11-24 NOTE — Progress Notes (Signed)
Nutrition Follow-up  DOCUMENTATION CODES:   Not applicable  INTERVENTION:  - once small bore NGT placed and placement confirmed: Osmolite 1.5 @ 25 ml/hr to advance by 10 ml every 24 hours to reach goal rate of 55 ml/hr with 45 ml Prosource TF BID and 30 ml free water QID.  - at goal rate, this regimen will provide 2060 kcal, 105 grams protein, and 1126 ml free water.   - will order 100 mg IV thiamine/day (cleared by MD)  Monitor magnesium, potassium, and phosphorus BID for at least 3 days, MD to replete as needed, as pt is at risk for refeeding syndrome given inconsistent nutrition provision/intake during 2 week hospitalization and current severe hypokalemia.   NUTRITION DIAGNOSIS:   Inadequate oral intake related to decreased appetite as evidenced by per patient/family report. -ongoing, now NPO  GOAL:   Patient will meet greater than or equal to 90% of their needs -unable to meet at this time  MONITOR:   TF tolerance, Labs, Weight trends, Skin  REASON FOR ASSESSMENT:   Consult Enteral/tube feeding initiation and management  ASSESSMENT:   80 year-old male with medical history of dyslipidemia, COVID-19, bladder cancer s/p laparoscopic radical cystectomy, robotic radical prostatectomy, bilateral pelvic lymph node dissection, extensive LOAs, SBR, and ileal conduit urinary diversion on 10/26 in the setting of invasive bladder cancer, on chemo. He was seen in the [facility]rology [facility] on 11/10 and found to have BP in the 49Q systolic.  Significant Events: 11/10- admission; NGT placed in R nare (gastric) 11/11- initial RD assessment  11/17- double lumen PICC placed in R basilic; TPN initiation 75/91- diet advanced to FLD; TPN held d/t candidemia 11/21- diet advanced to Dysphagia 3, thin liquids 11/24- liquids changed from thin to nectar-thick; triple lumen PICC placed in L basilic (PICC in R basilic removed at the same time)   Patient was changed back to NPO today at 0504. Consult  received for TF initiation and management.   Spoke with RN who reports that PO intakes have been minimal the past few days. Plan for small bore NGT placement today when feasible.   SLP assessed patient earlier this AM and recommendation is for NPO and for frequent and diligent oral care.   Weight has been fluctuating throughout hospitalization. Weight yesterday and today consistent with weights on 11/10 and 11/11. He is noted to have deep to very deep pitting edema to all extremities.    Labs reviewed; CBGs: 109 and 114 mg/dl, Ma: 146 mmol/l, K: 2.8 mmol/l, Cl: 116 mmol/l, Ca: 8.2 mg/dl.  Medications reviewed; 20 mg IV lasix x2 doses 11/25, sliding scale novolog, 1 tablet multivitamin/day, 5 ml mycostatin QID, 40 mEq Klor-Con x2 doses 11/25, 10 mEq IV KCl x4 runs 11/24 and x4 runs 11/25, 250 mg florastor BID.   Diet Order:   Diet Order             Diet NPO time specified  Diet effective now                   EDUCATION NEEDS:   Education needs have been addressed  Skin:  Skin Assessment: Skin Integrity Issues: Skin Integrity Issues:: Other (Comment) Incisions: abdomen (10/26) Other: skin tear to sacrum (newly documented 11/18)  Last BM:  11/25 (300 ml via rectal tube)  Height:   Ht Readings from Last 1 Encounters:  11/17/21 5\' 7"  (1.702 m)    Weight:   Wt Readings from Last 1 Encounters:  11/24/21 82.8 kg  Estimated Nutritional Needs:  Kcal:  2030-2275 kcal Protein:  100-115 grams Fluid:  >/= 2.2 L/day     Jarome Matin, MS, RD, LDN, CNSC Inpatient Clinical Dietitian RD pager # available in [facility]  After hours/weekend pager # available in [facility]

## 2021-11-24 NOTE — Progress Notes (Signed)
PROGRESS NOTE    William [name]  IAX:[id number] DOB: [birth year] DOA: 11/28/2021 PCP: [name], MD   Brief Narrative:  80 year old with history of bladder cancer status post robotic assisted laparoscopic radical cystectomy/prostatectomy on 10/25/2021 by [name], bilateral pelvic lymph node dissection, extensive laparoscopic DCO lysis, small bowel resection with ileal conduit, urinary diversion on 10/26/2021, COPD, tobacco use initially discharged on 11/02/2021 but returned back to the hospital 11/05/2021 due to hypotension.  Patient was found to be in septic shock secondary to urinary tract infection from multiple species requiring vasopressors and was admitted to the ICU.  Eventually patient was transferred to ICU on 11/11/2021.  Hospital course was complicated by ileus/partial small bowel obstruction therefore general surgery was consulted.  This was conservatively managed.  Initially in renal function worsened but thereafter it slowly improved.  Repeat CT scan showed large intra-abdominal abscess 25.5 centimeters the cultures grew Candida albicans.  Patient was started on IV Diflucan, ID was Consulted and was switched to Eraxis.  IR drained intra-abdominal abscess on 11/19.  Also received PRBC transfusion during hospitalization.  Also found to have right upper extremity DVT and SVT therefore started on treatment with Lovenox.   Assessment & Plan:   Principal Problem:   Septic shock ([name]) Active Problems:   Chronic pain   Bladder cancer ([place])   Bacteremia due to Gram-negative bacteria   UTI due to Klebsiella species   Acute kidney injury ([place])   Intra-abdominal abscess ([name])   S/P PICC central line placement   Fungemia   Swelling of joint of upper arm, right  Septic shock, sepsis criteria is improving, no longer on vasopressor, secondary to Klebsiella oxytoca/Enterococcus faecalis UTI/Klebsiella oxytoca bacteremia, newly diagnosed Candida albicans fungemia Intra-abdominal  abscess - Shock physiology-resolved. ID following.  Will defer Zosyn management to infectious disease - Previous blood cultures 11/10-grew Klebsiella, urine cultures grew Enterococcus faecalis/Klebsiella - Blood cultures 11/18 grew Candida albicans -Benign eye exam, completed by ophthalmology [name] Close on 11/19/2021.  No evidence of fungal endophthalmitis.  Right upper extremity DVT and SVT - Lovenox 1 mg/kg started.  Right upper extremity PICC line has been replaced with a left upper extremity PICC line  Severe protein calorie malnutrition Hypoalbuminemia - Minimal oral intake.  We will attempt for cortrak placement today  Mild acute respiratory distress with pulmonary edema Bilateral pleural effusion - Stop IV fluids.  We will give Lasix 20 mg IV x 2 doses -Encourage mobility  Intra-abdominal abscess seen on CT scan measuring 25.5 cm  -Status post image guided drain placement left lower quadrant by interventional radiology [name] on 11/18/2021.  IR to follow. - Currently on IV Eraxis.  Zosyn stopped 11/23.  Small bowel obstruction, now ileus/partial small bowel obstruction. -Initially noted on 11/10 on CT abdomen/pelvis, NG tube placed which was removed on 11/15.  Currently on dysphagia 3 diet.  TPN stopped due to fungemia.   Acute blood loss anemia -Baseline hemoglobin 13.  Required multiple PRBC transfusion 1U 11/19; 2U 11/22.  No ongoing signs of blood loss   Intermittent delirium -Secondary to underlying illness.  Getting bedtime Seroquel and trazodone.  Delirium protocol    Acute hypoxic respiratory failure secondary to bilateral pleural effusions Acute diastolic congestive heart failure with bilateral pleural effusions -As needed oxygen as needed, I-S/flutter valve.  Bronchodilators.  Out of bed to chair     Sinus tachycardia suspect related to sepsis and ongoing lung physiology -Slightly better but this is secondary to underlying pulmonary condition, generalized  deconditioning and to an extent dehydration.  IV Lopressor as needed   Nonoliguric acute kidney injury; resolved.  -Baseline creatinine around 0.8.  Slowly trended up and peaked at 2.58 > 1.84> 1.41 >0.8. nephro following.    Improving severe left flank/back pain secondary to intra-abdominal large abscess seen on CT scan. -As needed pain control  Oral thrush -Already on Eraxis, Magic mouthwash with lidocaine   Refractory hypokalemia post repletion -As needed repletion of electrolytes   Bladder cancer s/p radical cystectomy, prostatectomy and ileal conduit by urology [name] on 10/25/2021, and 10/26/2021. Underwent robotic assisted laparoscopic radical cystectomy, robotic radical prostatectomy, bilateral pelvic lymph node dissection, extensive laparoscopic adhesiolysis, small bowel resection and ileal conduit urinary diversion on 10/25/2021 and on 10/26/2021 by [name]. -Urology team following   COPD History of tobacco abuse -Continue Incruse, as needed bronchodilators, I-S/flutter.   Suspected ILD Noted on CT imaging this admission.  History of tobacco abuse. --Flonase --Outpatient pulmonology follow-up   Weakness/deconditioning/debility: PT/OT- HH  Overall patient is quite ill and remains full code.  Due to multiple ongoing medical issues and frailty, will consult palliative care service to help establish goals of care and discuss long-term feeding options especially if his oral intake remains very poor.    DVT prophylaxis: Subcutaneous heparin Code Status: Full code Family Communication: Wife is present at bedside  Status is: Inpatient  Remains inpatient appropriate because: Multiple ongoing issues as mentioned above, not ready for discharge.  Patient will need to be cleared by multiple services including ID, nephrology and urology prior to discharge  Nutritional status  Nutrition Problem: Inadequate oral intake Etiology: decreased appetite  Signs/Symptoms: per  patient/family report  Interventions: Ensure Enlive (each supplement provides 350kcal and 20 grams of protein), Prostat, MVI  Body mass index is 28.59 kg/m.     Subjective: Patient continues to have very poor oral intake.  He reports of little pain in his mouth and there is little bit of ulceration.  Wife is present at bedside.  Review of Systems Otherwise negative except as per HPI, including: General: Denies fever, chills, night sweats or unintended weight loss. Resp: Denies cough, wheezing, shortness of breath. Cardiac: Denies chest pain, palpitations, orthopnea, paroxysmal nocturnal dyspnea. GI: Denies abdominal pain, nausea, vomiting, diarrhea or constipation GU: Denies dysuria, frequency, hesitancy or incontinence MS: Denies muscle aches, joint pain or swelling Neuro: Denies headache, neurologic deficits (focal weakness, numbness, tingling), abnormal gait Psych: Denies anxiety, depression, SI/HI/AVH Skin: Denies new rashes or lesions ID: Denies sick contacts, exotic exposures, travel  Examination: Constitutional: Not in acute distress but appears chronically ill.  Temporal wasting Respiratory: Diminished breath sounds bilaterally Cardiovascular: Normal sinus rhythm, no rubs Abdomen: Nontender nondistended good bowel sounds Musculoskeletal: No edema noted Skin: No rashes seen Neurologic: CN 2-12 grossly intact.  And nonfocal Psychiatric: Normal judgment and insight. Alert and oriented x 3. Normal mood. PICC line in place; LUE Drain in place Foley catheter in place  Objective: Vitals:   11/24/21 0401 11/24/21 0500 11/24/21 0600 11/24/21 0732  BP: (!) 155/75 (!) 155/82 (!) 164/79   Pulse: 95 (!) 109 95   Resp: 20 17 (!) 28   Temp: 98.4 F (36.9 C)     TempSrc: Oral     SpO2: 95% 96% 94% 97%  Weight:  82.8 kg    Height:        Intake/Output Summary (Last 24 hours) at 11/24/2021 0740 Last data filed at 11/24/2021 0600 Gross per 24 hour  Intake 1008.79 ml  Output 3875 ml  Net -2866.21 ml   Filed Weights   11/22/21 0500 11/23/21 0500 11/24/21 0500  Weight: 91.3 kg 81.9 kg 82.8 kg     Data Reviewed:   CBC: Recent Labs  Lab 11/21/21 0515 11/21/21 1955 11/22/21 0500 11/23/21 0400 11/24/21 0549  WBC 8.4 9.3 9.2 8.3 10.2  NEUTROABS 6.4  --  7.3 6.3 7.8*  HGB 7.7* 9.7* 9.7* 9.0* 9.3*  HCT 24.4* 30.0* 29.7* 27.7* 29.4*  MCV 92.1 90.6 90.8 91.1 92.2  PLT 315 315 308 315 222   Basic Metabolic Panel: Recent Labs  Lab 11/18/21 0158 11/18/21 1928 11/19/21 0358 11/20/21 0540 11/21/21 0515 11/22/21 0500 11/23/21 0400 11/24/21 0549  NA 135  --  136 135 136  136 137 141 146*  K 3.6  --  3.5 3.0* 3.3*  3.2* 3.2* 3.2* 2.8*  CL 104  --  105 104 107  106 109 118* 116*  CO2 24  --  23 21* 22  22 21* 20* 23  GLUCOSE 119*  --  73 85 89  89 92 89 106*  BUN 31*  --  33* 24* 26*  27* 23 17 10   CREATININE 2.56*  --  2.58* 1.67* 2.45*  2.48* 1.84* 1.41* 0.88  CALCIUM 7.2*  --  7.0* 7.3* 7.2*  7.1* 7.4* 7.7* 8.2*  MG 2.0  --   --  1.8 1.9  --  2.2 1.9  PHOS  --  4.5 4.7* 2.9 3.5  --  2.7  --    GFR: Estimated Creatinine Clearance: 70.1 mL/min (by C-G formula based on SCr of 0.88 mg/dL). Liver Function Tests: Recent Labs  Lab 11/19/21 0358 11/20/21 0540 11/21/21 0515 11/22/21 0500 11/23/21 0400  AST 26 26 27 26 19   ALT 15 16 16 15 12   ALKPHOS 74 78 67 83 71  BILITOT 0.7 0.7 0.7 1.0 1.0  PROT 4.8* 5.7* 5.2* 5.5* 5.8*  ALBUMIN 1.7* 1.6* 1.5*  <1.5* <1.5* 2.3*   Recent Labs  Lab 11/17/21 1020  LIPASE 53*   No results for input(s): AMMONIA in the last 168 hours. Coagulation Profile: Recent Labs  Lab 11/18/21 1928  INR 1.3*   Cardiac Enzymes: No results for input(s): CKTOTAL, CKMB, CKMBINDEX, TROPONINI in the last 168 hours. BNP (last 3 results) No results for input(s): PROBNP in the last 8760 hours. HbA1C: No results for input(s): HGBA1C in the last 72 hours. CBG: Recent Labs  Lab 11/23/21 0601  11/23/21 1205 11/23/21 1712 11/23/21 2346 11/24/21 0507  GLUCAP 82 91 94 110* 109*   Lipid Profile: No results for input(s): CHOL, HDL, LDLCALC, TRIG, CHOLHDL, LDLDIRECT in the last 72 hours.  Thyroid Function Tests: No results for input(s): TSH, T4TOTAL, FREET4, T3FREE, THYROIDAB in the last 72 hours.  Anemia Panel: No results for input(s): VITAMINB12, FOLATE, FERRITIN, TIBC, IRON, RETICCTPCT in the last 72 hours. Sepsis Labs: Recent Labs  Lab 11/17/21 1020 11/18/21 0229  LATICACIDVEN 2.3* 1.0    Recent Results (from the past 240 hour(s))  Culture, blood (routine x 2)     Status: Abnormal   Collection Time: 11/17/21  5:10 AM   Specimen: BLOOD  Result Value Ref Range Status   Specimen Description   Final    BLOOD BLOOD LEFT HAND Performed at [facility], [place] [address] [place], [place] [address]    Special Requests   Final    BOTTLES DRAWN AEROBIC ONLY Blood Culture adequate volume Performed at [facility], [place] [address], [place], [place] [address]    Culture  Setup Time   Final    YEAST AEROBIC BOTTLE ONLY CRITICAL VALUE NOTED.  VALUE IS CONSISTENT WITH PREVIOUSLY REPORTED AND CALLED VALUE.    Culture (A)  Final    CANDIDA ALBICANS SUSCEPTIBILITIES PERFORMED ON PREVIOUS CULTURE WITHIN THE LAST 5 DAYS. Performed at [facility], [place] [address], [place], [place] [address]    Report Status 11/23/2021 FINAL  Final  Culture, blood (routine x 2)     Status: Abnormal   Collection Time: 11/17/21  5:20 AM   Specimen: BLOOD  Result Value Ref Range Status   Specimen Description   Final    BLOOD BLOOD LEFT WRIST Performed at [place] [address] [place], [place] [address]    Special Requests   Final    BOTTLES DRAWN AEROBIC ONLY Blood Culture adequate volume Performed at [place] [address], [place], [place] [address]    Culture  Setup Time   Final    YEAST AEROBIC  BOTTLE ONLY CRITICAL RESULT CALLED TO, READ BACK BY AND VERIFIED WITH: A PHAM PHARMD @0821  11/18/21 EB    Culture (A)  Final    CANDIDA ALBICANS SEE SEPARATE REPORT Performed at [facility], [address], [place],  [address]    Report Status 11/23/2021 FINAL  Final  Blood Culture ID Panel (Reflexed)     Status: Abnormal   Collection Time: 11/17/21  5:20 AM  Result Value Ref Range Status   Enterococcus faecalis NOT DETECTED NOT DETECTED Final   Enterococcus Faecium NOT DETECTED NOT DETECTED Final   Listeria monocytogenes NOT DETECTED NOT DETECTED Final   Staphylococcus species NOT DETECTED NOT DETECTED Final   Staphylococcus aureus (BCID) NOT DETECTED NOT DETECTED Final   Staphylococcus epidermidis NOT DETECTED NOT DETECTED Final   Staphylococcus lugdunensis NOT DETECTED NOT DETECTED Final   Streptococcus species NOT DETECTED NOT DETECTED Final   Streptococcus agalactiae NOT DETECTED NOT DETECTED Final   Streptococcus pneumoniae NOT DETECTED NOT DETECTED Final   Streptococcus pyogenes NOT DETECTED NOT DETECTED Final   A.calcoaceticus-baumannii NOT DETECTED NOT DETECTED Final   Bacteroides fragilis NOT DETECTED NOT DETECTED Final   Enterobacterales NOT DETECTED NOT DETECTED Final   Enterobacter cloacae complex NOT DETECTED NOT DETECTED Final   Escherichia coli NOT DETECTED NOT DETECTED Final   Klebsiella aerogenes NOT DETECTED NOT DETECTED Final   Klebsiella oxytoca NOT DETECTED NOT DETECTED Final   Klebsiella pneumoniae NOT DETECTED NOT DETECTED Final   Proteus species NOT DETECTED NOT DETECTED Final   Salmonella species NOT DETECTED NOT DETECTED Final   Serratia marcescens NOT DETECTED NOT DETECTED Final   Haemophilus influenzae NOT DETECTED NOT DETECTED Final   Neisseria meningitidis NOT DETECTED NOT DETECTED Final   Pseudomonas aeruginosa NOT DETECTED NOT DETECTED Final   Stenotrophomonas maltophilia NOT DETECTED NOT DETECTED Final   Candida albicans DETECTED  (A) NOT DETECTED Final    Comment: CRITICAL RESULT CALLED TO, READ BACK BY AND VERIFIED WITH: A PHAM PHARMD @0821  11/18/21 EB    Candida auris NOT DETECTED NOT DETECTED Final   Candida glabrata NOT DETECTED NOT DETECTED Final   Candida krusei NOT DETECTED NOT DETECTED Final   Candida parapsilosis NOT DETECTED NOT DETECTED Final   Candida tropicalis NOT DETECTED NOT DETECTED Final   Cryptococcus neoformans/gattii NOT DETECTED NOT DETECTED Final    Comment: Performed at [facility], [address], [place], [place] [address]  Antifungal AST 9 Drug Panel     Status: None   Collection Time: 11/17/21  5:20 AM  Result Value Ref Range Status   Organism ID, Yeast Candida albicans  Corrected    Comment: (NOTE) Identification performed by account, not confirmed by this laboratory. CORRECTED ON 11/24 AT 1336: PREVIOUSLY REPORTED AS Preliminary report    Amphotericin B MIC 0.5 ug/mL  Final    Comment: (NOTE) Breakpoints have been established for only some organism-drug combinations as indicated. This test was developed and its performance characteristics determined by [facility]. It has not been cleared or approved by the Food and Drug Administration.    Anidulafungin MIC Comment  Final    Comment: (NOTE) 0.016 ug/mL Susceptible Breakpoints have been established for only some organism-drug combinations as indicated. This test was developed and its performance characteristics determined by [facility]. It has not been cleared or approved by the Food and Drug Administration.    Caspofungin MIC Comment  Final    Comment: (NOTE) 0.016 ug/mL Susceptible Breakpoints have been established for only some organism-drug combinations as indicated. This test was developed and its performance characteristics determined by [facility]. It has not been cleared or approved by the Food and Drug Administration.    Micafungin MIC Comment  Final    Comment: (NOTE) 0.016 ug/mL Susceptible Breakpoints have  been established for only some organism-drug combinations as indicated. This test was developed and its performance characteristics determined by [facility]. It has not been cleared or approved by the Food and Drug Administration.    Posaconazole MIC 0.03 ug/mL  Final    Comment: (NOTE) Breakpoints have been established for only some organism-drug combinations as indicated. This test was developed and its performance characteristics determined by [facility]. It has not been cleared or approved by the Food and Drug Administration.    Fluconazole Islt MIC 0.5 ug/mL Susceptible  Final    Comment: (NOTE) Breakpoints have been established for only some organism-drug combinations as indicated. This test was developed and its performance characteristics determined by [facility]. It has not been cleared or approved by the Food and Drug Administration.    Flucytosine MIC 1.0 ug/mL  Final    Comment: (NOTE) Breakpoints have been established for only some organism-drug combinations as indicated. This test was developed and its performance characteristics determined by [facility]. It has not been cleared or approved by the Food and Drug Administration.    Itraconazole MIC 0.12 ug/mL  Final    Comment: (NOTE) Breakpoints have been established for only some organism-drug combinations as indicated. This test was developed and its performance characteristics determined by [facility]. It has not been cleared or approved by the Food and Drug Administration.    Voriconazole MIC Comment  Final    Comment: (NOTE) 0.016 ug/mL Susceptible Breakpoints have been established for only some organism-drug combinations as indicated. This test was developed and its performance characteristics determined by [facility]. It has not been cleared or approved by the Food and Drug Administration. Performed At: [facility] [place], [place] [id number] Rush Farmer MD XN:[id number]    Source [phone]  SORUCE BLD CBenjamine [place] SENSI  Final    Comment: Performed at [facility], [place] [address], [place], [place] [address]  MRSA Next Gen by PCR, Nasal     Status: None   Collection Time: 11/17/21  6:24 PM   Specimen: Nasal Mucosa; Nasal Swab  Result Value Ref Range Status   MRSA by PCR  Next Gen NOT DETECTED NOT DETECTED Final    Comment: (NOTE) The GeneXpert MRSA Assay (FDA approved for NASAL specimens only), is one component of a comprehensive MRSA colonization surveillance program. It is not intended to diagnose MRSA infection nor to guide or monitor treatment for MRSA infections. Test performance is not FDA approved in patients less than 43 years old. Performed at [facility], [place] [address], [place], [place] [address]   C Difficile Quick Screen (NO PCR Reflex)     Status: None   Collection Time: 11/17/21  8:40 PM   Specimen: STOOL  Result Value Ref Range Status   C Diff antigen NEGATIVE NEGATIVE Final   C Diff toxin NEGATIVE NEGATIVE Final   C Diff interpretation NEGATIVE  Final    Comment: Performed at [facility], [place] [address] [place] [address] [place], [place] [address]  Aerobic/Anaerobic Culture w Gram Stain (surgical/deep wound)     Status: None   Collection Time: 11/18/21 11:01 AM   Specimen: Abdomen; Abscess  Result Value Ref Range Status   Specimen Description   Final    ABDOMEN Performed at [place] [address] [place], [place] [address]    Special Requests   Final    ABSCES Performed at [facility], [place] [address] [place], [place] [address]    Gram Stain   Final    MODERATE WBC PRESENT,BOTH PMN AND MONONUCLEAR NO ORGANISMS SEEN    Culture   Final    RARE ESCHERICHIA COLI RARE CANDIDA ALBICANS NO ANAEROBES ISOLATED Performed at [facility], [address], [place], [place] [address]    Report Status 11/23/2021 FINAL  Final   Organism ID, Bacteria ESCHERICHIA COLI  Final       Susceptibility   Escherichia coli - MIC*    AMPICILLIN <=2 SENSITIVE Sensitive     CEFAZOLIN <=4 SENSITIVE Sensitive     CEFEPIME <=0.12 SENSITIVE Sensitive     CEFTAZIDIME <=1 SENSITIVE Sensitive     CEFTRIAXONE <=0.25 SENSITIVE Sensitive     CIPROFLOXACIN <=0.25 SENSITIVE Sensitive     GENTAMICIN <=1 SENSITIVE Sensitive     IMIPENEM <=0.25 SENSITIVE Sensitive     TRIMETH/SULFA <=20 SENSITIVE Sensitive     AMPICILLIN/SULBACTAM <=2 SENSITIVE Sensitive     PIP/TAZO <=4 SENSITIVE Sensitive     * RARE ESCHERICHIA COLI  Fungus Stain     Status: None   Collection Time: 11/18/21 11:01 AM   Specimen: Abscess  Result Value Ref Range Status   FUNGUS STAIN Final report  Final    Comment: (NOTE) Performed At: [facility] [address] [place], [place] [id number] Rush Farmer MD [id number]    Fungal Source ABSCESS  Final    Comment: Performed at [facility], [place] [address], [place],  [address]  Fungal Stain reflex     Status: None   Collection Time: 11/18/21 11:01 AM  Result Value Ref Range Status   Fungal stain result 1 Comment  Final    Comment: (NOTE) KOH/Calcofluor preparation:  no fungus observed. Performed At: [facility] [place], [place] [id number] Rush Farmer MD EV:[id number]   Culture, fungus without smear     Status: Abnormal (Preliminary result)   Collection Time: 11/18/21 11:02 AM   Specimen: Abscess; Other  Result Value Ref Range Status   Specimen Description   Final    ABSCESS Performed at [place] [address] [place], [place]  [address]    Special Requests ABDOMEN  Final   Culture (A)  Final    CANDIDA ALBICANS CONTINUING TO HOLD Performed at [facility], [place] [address] [place], [place] [address]    Report Status PENDING  Incomplete  Urine Culture     Status: Abnormal   Collection Time: 11/18/21  3:03 PM   Specimen: Urine, Clean Catch  Result Value Ref Range Status    Specimen Description   Final    URINE, CLEAN CATCH Performed at [facility], [place] [address], [place], [place] [address]    Special Requests   Final    NONE Performed at [facility], [place] [address], [place], [place] [address]    Culture (A)  Final    <10,000 COLONIES/mL INSIGNIFICANT GROWTH Performed at [place] [address], [place], [place] [address]    Report Status 11/20/2021 FINAL  Final  Culture, blood (routine x 2)     Status: None (Preliminary result)   Collection Time: 11/18/21  7:27 PM   Specimen: BLOOD  Result Value Ref Range Status   Specimen Description   Final    BLOOD BLOOD LEFT WRIST Performed at [place] [address], [place], [place] [address]    Special Requests   Final    BOTTLES DRAWN AEROBIC ONLY Blood Culture adequate volume Performed at [place] [address] [place], [place] [address]    Culture  Setup Time   Final    YEAST AEROBIC BOTTLE ONLY CRITICAL VALUE NOTED.  VALUE IS CONSISTENT WITH PREVIOUSLY REPORTED AND CALLED VALUE.    Culture   Final    NO GROWTH 4 DAYS Performed at [facility], [place] [address], [place], [place] [address]    Report Status PENDING  Incomplete  Fungus culture, blood     Status: None (Preliminary result)   Collection Time: 11/18/21  7:28 PM   Specimen: BLOOD  Result Value Ref Range Status   Specimen Description   Final    BLOOD BLOOD LEFT HAND Performed at [place] [address] [place], [place] [address]    Special Requests   Final    BOTTLES DRAWN AEROBIC ONLY Blood Culture adequate volume Performed at [place] [address] [place], [place] [address]    Fungal Smear   Final    YEAST AEROBIC BOTTLE ONLY CRITICAL VALUE NOTED.  VALUE IS CONSISTENT WITH PREVIOUSLY REPORTED AND CALLED VALUE.    Culture   Final    NO GROWTH 4 DAYS Performed at [facility], [place] [address] [place], [place] [address]    Report Status PENDING  Incomplete  Culture, blood (routine x 2)     Status: None (Preliminary result)   Collection Time: 11/18/21  7:28 PM   Specimen: BLOOD  Result Value Ref Range Status   Specimen Description   Final    BLOOD BLOOD LEFT HAND Performed at [place] [address], [place], [place] [address]    Special Requests   Final    BOTTLES DRAWN AEROBIC ONLY Blood Culture adequate volume Performed at [place] [address] [place], [place] [address]    Culture   Final    NO GROWTH 4 DAYS Performed at [facility], [place] [address], [place], [place] [address]    Report Status PENDING  Incomplete  Radiology Studies: DG Chest Port 1 View  Result Date: 11/23/2021 CLINICAL DATA:  PICC line placement. EXAM: PORTABLE CHEST 1 VIEW COMPARISON:  11/17/2021 studies FINDINGS: A LEFT-sided PICC line is noted with tip overlying the mid-LOWER SVC. A RIGHT PICC line is again identified with tip in the region of the SUPERIOR cavoatrial junction. New diffuse bilateral airspace opacities are noted. No pneumothorax or large pleural effusion identified. No acute bony abnormalities are present. IMPRESSION: 1. LEFT PICC line placement with tip overlying the mid-LOWER SVC 2. New diffuse bilateral airspace opacities, question edema or infection. Electronically Signed   By: Margarette Canada M.D.   On: 11/23/2021 11:49   VAS [place] UPPER EXTREMITY VENOUS DUPLEX  Result Date: 11/23/2021 UPPER VENOUS STUDY  Patient Name:  William [name]  Date of Exam:   11/23/2021 Medical Rec #: [id number]       Accession #:    [id number] Date of Birth: [birth year]       Patient Gender: M Patient Age:   73 years Exam Location:  [facility] Procedure:      VAS [place] UPPER EXTREMITY VENOUS DUPLEX Referring Phys: CORNELIUS VAN DAM --------------------------------------------------------------------------------   Indications: RUE swelling Risk Factors: PICC line placement. Limitations: Line and bandages. Comparison Study: Previous exam of BUE on 11/14/2021 was negative for DVT. Performing Technologist: Rogelia Rohrer RVT, RDMS  Examination Guidelines: A complete evaluation includes B-mode imaging, spectral Doppler, color Doppler, and power Doppler as needed of all accessible portions of each vessel. Bilateral testing is considered an integral part of a complete examination. Limited examinations for reoccurring indications may be performed as noted.  Right Findings: +----------+------------+---------+-----------+----------+---------------------+ RIGHT     CompressiblePhasicitySpontaneousProperties       Summary        +----------+------------+---------+-----------+----------+---------------------+ IJV           Full       Yes       Yes                                    +----------+------------+---------+-----------+----------+---------------------+ Subclavian    Full       Yes       Yes                                    +----------+------------+---------+-----------+----------+---------------------+ Axillary      Full       Yes       Yes                                    +----------+------------+---------+-----------+----------+---------------------+ Brachial    Partial      No        No                  Acute in one of                                                            paired veins      +----------+------------+---------+-----------+----------+---------------------+ Radial        Full                                                        +----------+------------+---------+-----------+----------+---------------------+  Ulnar         Full                                                        +----------+------------+---------+-----------+----------+---------------------+ Cephalic      Full                                                         +----------+------------+---------+-----------+----------+---------------------+ Basilic       None       No        No                       Acute         +----------+------------+---------+-----------+----------+---------------------+ Difficulty imaging brachial and basilic veins due to PICC line placement.  Left Findings: +----------+------------+---------+-----------+----------+-------+ LEFT      CompressiblePhasicitySpontaneousPropertiesSummary +----------+------------+---------+-----------+----------+-------+ Subclavian    Full       Yes       Yes                      +----------+------------+---------+-----------+----------+-------+  Summary:  Right: Findings consistent with acute deep vein thrombosis involving the right brachial veins. Findings consistent with acute superficial vein thrombosis involving the right basilic vein.  Left: No evidence of thrombosis in the subclavian.  *See table(s) above for measurements and observations.    Preliminary    [place] EKG SITE RITE  Result Date: 11/22/2021 If Site Rite image not attached, placement could not be confirmed due to current cardiac rhythm.       Scheduled Meds:  (feeding supplement) PROSource Plus  30 mL Oral BID BM   chlorhexidine  15 mL Mouth Rinse BID   Chlorhexidine Gluconate Cloth  6 each Topical Daily   enoxaparin (LOVENOX) injection  80 mg Subcutaneous Q12H   feeding supplement  237 mL Oral TID BM   fluticasone  2 spray Each Nare Daily   furosemide  20 mg Intravenous Q6H   insulin aspart  0-9 Units Subcutaneous Q6H   lip balm  1 application Topical BID   magic mouthwash w/lidocaine  10 mL Oral QID   mouth rinse  15 mL Mouth Rinse q12n4p   multivitamin with minerals  1 tablet Oral Daily   nystatin  5 mL Oral QID   nystatin   Topical BID   potassium chloride  40 mEq Oral Q4H   QUEtiapine  12.5 mg Oral QHS   saccharomyces boulardii  250 mg Oral BID   sodium chloride flush  10-40 mL Intracatheter Q12H    sodium chloride flush  10-40 mL Intracatheter Q12H   sodium chloride flush  5 mL Intracatheter Q8H   traZODone  50 mg Oral QHS   umeclidinium bromide  1 puff Inhalation Daily   Continuous Infusions:  sodium chloride Stopped (11/23/21 1558)   sodium chloride Stopped (11/21/21 1838)   anidulafungin Stopped (11/23/21 1129)   chlorproMAZINE (THORAZINE) IV Stopped (11/22/21 0414)   methocarbamol (ROBAXIN) IV Stopped (11/20/21 2252)   ondansetron (ZOFRAN) IV     piperacillin-tazobactam (ZOSYN)  IV Stopped (11/24/21 0429)   potassium chloride  LOS: 15 days   Time spent= 35 mins    Blaze Nylund Arsenio Loader, MD [facility]  If 7PM-7AM, please contact night-coverage  11/24/2021, 7:40 AM

## 2021-11-24 NOTE — Progress Notes (Addendum)
Subjective: He is complaining of left arm pain where he has new PICC line inserted  Antibiotics:  Anti-infectives (From admission, onward)    Start     Dose/Rate Route Frequency Ordered Stop   11/20/21 1000  anidulafungin (ERAXIS) 100 mg in sodium chloride 0.9 % 100 mL IVPB        100 mg 78 mL/hr over 100 Minutes Intravenous Every 24 hours 11/19/21 0729     11/19/21 1200  fluconazole (DIFLUCAN) IVPB 200 mg  Status:  Discontinued        200 mg 100 mL/hr over 60 Minutes Intravenous Every 24 hours 11/18/21 0852 11/19/21 0729   11/19/21 0900  anidulafungin (ERAXIS) 200 mg in sodium chloride 0.9 % 200 mL IVPB        200 mg 78 mL/hr over 200 Minutes Intravenous  Once 11/19/21 0729 11/19/21 1220   11/18/21 0945  fluconazole (DIFLUCAN) IVPB 800 mg        800 mg 200 mL/hr over 120 Minutes Intravenous  Once 11/18/21 0852 11/18/21 1204   11/17/21 1600  piperacillin-tazobactam (ZOSYN) IVPB 3.375 g  Status:  Discontinued        3.375 g 12.5 mL/hr over 240 Minutes Intravenous Every 8 hours 11/17/21 1333 11/24/21 1105   11/12/21 0800  Ampicillin-Sulbactam (UNASYN) 3 g in sodium chloride 0.9 % 100 mL IVPB  Status:  Discontinued        3 g 200 mL/hr over 30 Minutes Intravenous Every 6 hours 11/12/21 0712 11/17/21 1333   11/10/21 1400  cefTRIAXone (ROCEPHIN) 2 g in sodium chloride 0.9 % 100 mL IVPB  Status:  Discontinued        2 g 200 mL/hr over 30 Minutes Intravenous Daily 11/10/21 1048 11/12/21 0712   11/10/21 0800  ceFEPIme (MAXIPIME) 2 g in sodium chloride 0.9 % 100 mL IVPB  Status:  Discontinued        2 g 200 mL/hr over 30 Minutes Intravenous Every 12 hours 11/10/21 0618 11/10/21 1048   11/10/21 0630  ceFEPIme (MAXIPIME) 2 g in sodium chloride 0.9 % 100 mL IVPB  Status:  Discontinued        2 g 200 mL/hr over 30 Minutes Intravenous Every 24 hours 11/26/2021 1622 11/10/21 0618   11/20/2021 1624  vancomycin variable dose per unstable renal function (pharmacist dosing)  Status:   Discontinued         Does not apply See admin instructions 11/07/2021 1624 11/10/21 0613   11/19/2021 1030  ceFEPIme (MAXIPIME) 2 g in sodium chloride 0.9 % 100 mL IVPB        2 g 200 mL/hr over 30 Minutes Intravenous  Once 11/10/2021 1016 11/14/2021 1133   11/17/2021 1030  metroNIDAZOLE (FLAGYL) IVPB 500 mg        500 mg 100 mL/hr over 60 Minutes Intravenous  Once 11/13/2021 1016 11/01/2021 1915   11/01/2021 1030  vancomycin (VANCOCIN) IVPB 1000 mg/200 mL premix  Status:  Discontinued        1,000 mg 200 mL/hr over 60 Minutes Intravenous  Once 11/16/2021 1016 11/06/2021 1023   10/31/2021 1030  vancomycin (VANCOREADY) IVPB 1500 mg/300 mL        1,500 mg 150 mL/hr over 120 Minutes Intravenous  Once 11/23/2021 1023 11/28/2021 1341       Medications: Scheduled Meds:  chlorhexidine  15 mL Mouth Rinse BID   Chlorhexidine Gluconate Cloth  6 each Topical Daily   enoxaparin (LOVENOX) injection  80  mg Subcutaneous Q12H   feeding supplement (OSMOLITE 1.5 CAL)  1,000 mL Per Tube Q24H   feeding supplement (PROSource TF)  45 mL Per Tube BID   fluticasone  2 spray Each Nare Daily   free water  30 mL Per Tube Q6H   furosemide  20 mg Intravenous Q6H   insulin aspart  0-9 Units Subcutaneous Q6H   lip balm  1 application Topical BID   mouth rinse  15 mL Mouth Rinse q12n4p   multivitamin with minerals  1 tablet Oral Daily   nystatin  5 mL Oral QID   nystatin   Topical BID   QUEtiapine  12.5 mg Oral QHS   saccharomyces boulardii  250 mg Oral BID   sodium chloride flush  10-40 mL Intracatheter Q12H   sodium chloride flush  10-40 mL Intracatheter Q12H   sodium chloride flush  5 mL Intracatheter Q8H   thiamine injection  100 mg Intravenous Daily   traZODone  50 mg Oral QHS   umeclidinium bromide  1 puff Inhalation Daily   Continuous Infusions:  sodium chloride Stopped (11/23/21 1558)   sodium chloride Stopped (11/21/21 1838)   anidulafungin Stopped (11/24/21 1153)   chlorproMAZINE (THORAZINE) IV Stopped (11/22/21  0414)   [START ON 11/25/2021] feeding supplement (OSMOLITE 1.5 CAL)     methocarbamol (ROBAXIN) IV Stopped (11/20/21 2252)   ondansetron (ZOFRAN) IV     potassium chloride     PRN Meds:.sodium chloride, acetaminophen, ALPRAZolam, chlorproMAZINE (THORAZINE) IV, diphenhydrAMINE, guaiFENesin, hydrALAZINE, HYDROmorphone (DILAUDID) injection, magic mouthwash, menthol-cetylpyridinium, methocarbamol (ROBAXIN) IV, metoprolol tartrate, ondansetron (ZOFRAN) IV **OR** ondansetron (ZOFRAN) IV, phenol, senna-docusate, sodium chloride flush, sodium chloride flush    Objective: Weight change: 0.9 kg  Intake/Output Summary (Last 24 hours) at 11/24/2021 1507 Last data filed at 11/24/2021 1400 Gross per 24 hour  Intake 885.99 ml  Output 5800 ml  Net -4914.01 ml    Blood pressure (!) 165/102, pulse (!) 107, temperature (!) 97.4 F (36.3 C), temperature source Oral, resp. rate (!) 24, height 5\' 7"  (1.702 m), weight 82.8 kg, SpO2 92 %. Temp:  [97.4 F (36.3 C)-99.1 F (37.3 C)] 97.4 F (36.3 C) (11/25 1200) Pulse Rate:  [80-124] 107 (11/25 1300) Resp:  [17-35] 24 (11/25 1300) BP: (140-175)/(70-131) 165/102 (11/25 1300) SpO2:  [89 %-99 %] 92 % (11/25 1300) Weight:  [82.8 kg] 82.8 kg (11/25 0500)  Physical Exam: Physical Exam Vitals reviewed.  Constitutional:      Appearance: He is ill-appearing.  HENT:     Head: Normocephalic and atraumatic.  Cardiovascular:     Heart sounds: No murmur heard.   No friction rub. No gallop.  Pulmonary:     Effort: Pulmonary effort is normal. No respiratory distress.     Breath sounds: No stridor. No wheezing.  Abdominal:     General: Bowel sounds are normal. There is no distension.  Neurological:     Mental Status: He is alert.    Drain in place  RUE edematous  LUE with pain near PICC and edema  CBC:    BMET Recent Labs    11/23/21 0400 11/24/21 0549  NA 141 146*  K 3.2* 2.8*  CL 118* 116*  CO2 20* 23  GLUCOSE 89 106*  BUN 17 10   CREATININE 1.41* 0.88  CALCIUM 7.7* 8.2*      Liver Panel  Recent Labs    11/22/21 0500 11/23/21 0400  PROT 5.5* 5.8*  ALBUMIN <1.5* 2.3*  AST 26 19  ALT  15 12  ALKPHOS 83 71  BILITOT 1.0 1.0        Sedimentation Rate No results for input(s): ESRSEDRATE in the last 72 hours. C-Reactive Protein No results for input(s): CRP in the last 72 hours.  Micro Results: Recent Results (from the past 720 hour(s))  Blood Culture (routine x 2)     Status: Abnormal   Collection Time: 11/06/2021 10:16 AM   Specimen: BLOOD RIGHT ARM  Result Value Ref Range Status   Specimen Description BLOOD RIGHT ARM  Final   Special Requests   Final    BOTTLES DRAWN AEROBIC AND ANAEROBIC Blood Culture adequate volume   Culture  Setup Time   Final    GRAM NEGATIVE RODS IN BOTH AEROBIC AND ANAEROBIC BOTTLES CRITICAL RESULT CALLED TO, READ BACK BY AND VERIFIED WITH: M LILLISTON,PHARMD@[id number]  11/10/21 [place] Performed at [facility], [place] [address] [place], [place] [address]    Culture KLEBSIELLA OXYTOCA (A)  Final   Report Status 11/12/2021 FINAL  Final   Organism ID, Bacteria KLEBSIELLA OXYTOCA  Final      Susceptibility   Klebsiella oxytoca - MIC*    AMPICILLIN >=32 RESISTANT Resistant     CEFAZOLIN 8 SENSITIVE Sensitive     CEFEPIME <=0.12 SENSITIVE Sensitive     CEFTAZIDIME <=1 SENSITIVE Sensitive     CEFTRIAXONE <=0.25 SENSITIVE Sensitive     CIPROFLOXACIN <=0.25 SENSITIVE Sensitive     GENTAMICIN <=1 SENSITIVE Sensitive     IMIPENEM <=0.25 SENSITIVE Sensitive     TRIMETH/SULFA <=20 SENSITIVE Sensitive     AMPICILLIN/SULBACTAM 8 SENSITIVE Sensitive     PIP/TAZO <=4 SENSITIVE Sensitive     * KLEBSIELLA OXYTOCA  Urine Culture     Status: Abnormal   Collection Time: 11/23/2021 10:16 AM   Specimen: In/Out Cath Urine  Result Value Ref Range Status   Specimen Description   Final    IN/OUT CATH URINE Performed at [facility] [address], [place], [place]  [address]    Special Requests   Final    NONE Performed at [facility], [place] [address], [place], [place] [address]    Culture (A)  Final    >=100,000 COLONIES/mL KLEBSIELLA OXYTOCA >=100,000 COLONIES/mL ENTEROCOCCUS FAECALIS    Report Status 11/12/2021 FINAL  Final   Organism ID, Bacteria KLEBSIELLA OXYTOCA (A)  Final   Organism ID, Bacteria ENTEROCOCCUS FAECALIS (A)  Final      Susceptibility   Enterococcus faecalis - MIC*    AMPICILLIN <=2 SENSITIVE Sensitive     NITROFURANTOIN <=16 SENSITIVE Sensitive     VANCOMYCIN 1 SENSITIVE Sensitive     * >=100,000 COLONIES/mL ENTEROCOCCUS FAECALIS   Klebsiella oxytoca - MIC*    AMPICILLIN >=32 RESISTANT Resistant     CEFAZOLIN 8 SENSITIVE Sensitive     CEFEPIME <=0.12 SENSITIVE Sensitive     CEFTRIAXONE <=0.25 SENSITIVE Sensitive     CIPROFLOXACIN <=0.25 SENSITIVE Sensitive     GENTAMICIN <=1 SENSITIVE Sensitive     IMIPENEM <=0.25 SENSITIVE Sensitive     NITROFURANTOIN 32 SENSITIVE Sensitive     TRIMETH/SULFA <=20 SENSITIVE Sensitive     AMPICILLIN/SULBACTAM 8 SENSITIVE Sensitive     PIP/TAZO <=4 SENSITIVE Sensitive     * >=100,000 COLONIES/mL KLEBSIELLA OXYTOCA  Blood Culture ID Panel (Reflexed)     Status: Abnormal   Collection Time: 11/20/2021 10:16 AM  Result Value Ref Range Status   Enterococcus faecalis NOT DETECTED NOT DETECTED Final   Enterococcus  Faecium NOT DETECTED NOT DETECTED Final   Listeria monocytogenes NOT DETECTED NOT DETECTED Final   Staphylococcus species NOT DETECTED NOT DETECTED Final   Staphylococcus aureus (BCID) NOT DETECTED NOT DETECTED Final   Staphylococcus epidermidis NOT DETECTED NOT DETECTED Final   Staphylococcus lugdunensis NOT DETECTED NOT DETECTED Final   Streptococcus species NOT DETECTED NOT DETECTED Final   Streptococcus agalactiae NOT DETECTED NOT DETECTED Final   Streptococcus pneumoniae NOT DETECTED NOT DETECTED Final   Streptococcus pyogenes NOT DETECTED NOT DETECTED Final    A.calcoaceticus-baumannii NOT DETECTED NOT DETECTED Final   Bacteroides fragilis NOT DETECTED NOT DETECTED Final   Enterobacterales DETECTED (A) NOT DETECTED Final    Comment: Enterobacterales represent a large order of gram negative bacteria, not a single organism. CRITICAL RESULT CALLED TO, READ BACK BY AND VERIFIED WITH: M LILLISTON,PHARMD@[id number]  11/10/21 [place]    Enterobacter cloacae complex NOT DETECTED NOT DETECTED Final   Escherichia coli NOT DETECTED NOT DETECTED Final   Klebsiella aerogenes NOT DETECTED NOT DETECTED Final   Klebsiella oxytoca DETECTED (A) NOT DETECTED Final    Comment: CRITICAL RESULT CALLED TO, READ BACK BY AND VERIFIED WITH: M LILLISTON,PHARMD@[id number]  11/10/21 [place]    Klebsiella pneumoniae NOT DETECTED NOT DETECTED Final   Proteus species NOT DETECTED NOT DETECTED Final   Salmonella species NOT DETECTED NOT DETECTED Final   Serratia marcescens NOT DETECTED NOT DETECTED Final   Haemophilus influenzae NOT DETECTED NOT DETECTED Final   Neisseria meningitidis NOT DETECTED NOT DETECTED Final   Pseudomonas aeruginosa NOT DETECTED NOT DETECTED Final   Stenotrophomonas maltophilia NOT DETECTED NOT DETECTED Final   Candida albicans NOT DETECTED NOT DETECTED Final   Candida auris NOT DETECTED NOT DETECTED Final   Candida glabrata NOT DETECTED NOT DETECTED Final   Candida krusei NOT DETECTED NOT DETECTED Final   Candida parapsilosis NOT DETECTED NOT DETECTED Final   Candida tropicalis NOT DETECTED NOT DETECTED Final   Cryptococcus neoformans/gattii NOT DETECTED NOT DETECTED Final   CTX-M ESBL NOT DETECTED NOT DETECTED Final   Carbapenem resistance IMP NOT DETECTED NOT DETECTED Final   Carbapenem resistance KPC NOT DETECTED NOT DETECTED Final   Carbapenem resistance NDM NOT DETECTED NOT DETECTED Final   Carbapenem resist OXA 48 LIKE NOT DETECTED NOT DETECTED Final   Carbapenem resistance VIM NOT DETECTED NOT DETECTED Final    Comment: Performed at [facility],  [address], [place], [place] [address]  Blood Culture (routine x 2)     Status: Abnormal   Collection Time: 11/21/2021 10:21 AM   Specimen: BLOOD RIGHT WRIST  Result Value Ref Range Status   Specimen Description   Final    BLOOD RIGHT WRIST Performed at [facility], [address] [place], [place] [address]    Special Requests   Final    BOTTLES DRAWN AEROBIC ONLY Blood Culture adequate volume Performed at [facility] [address] [place], [place] [address]    Culture  Setup Time   Final    GRAM NEGATIVE RODS AEROBIC BOTTLE ONLY CRITICAL VALUE NOTED.  VALUE IS CONSISTENT WITH PREVIOUSLY REPORTED AND CALLED VALUE.    Culture (A)  Final    KLEBSIELLA OXYTOCA SUSCEPTIBILITIES PERFORMED ON PREVIOUS CULTURE WITHIN THE LAST 5 DAYS. Performed at [facility], [place] [address] [place], [place] [address]    Report Status 11/12/2021 FINAL  Final  Resp Panel by RT-PCR (Flu A&B, Covid) Nasopharyngeal Swab     Status: None   Collection  Time: 11/01/2021 11:18 AM   Specimen: Nasopharyngeal Swab; Nasopharyngeal(NP) swabs in vial transport medium  Result Value Ref Range Status   SARS Coronavirus 2 by RT PCR NEGATIVE NEGATIVE Final    Comment: (NOTE) SARS-CoV-2 target nucleic acids are NOT DETECTED.  The SARS-CoV-2 RNA is generally detectable in upper respiratory specimens during the acute phase of infection. The lowest concentration of SARS-CoV-2 viral copies this assay can detect is 138 copies/mL. A negative result does not preclude SARS-Cov-2 infection and should not be used as the sole basis for treatment or other patient management decisions. A negative result may occur with  improper specimen collection/handling, submission of specimen other than nasopharyngeal swab, presence of viral mutation(s) within the areas targeted by this assay, and inadequate number of viral copies(<138 copies/mL). A negative result must be combined with clinical observations,  patient history, and epidemiological information. The expected result is Negative.  Fact Sheet for Patients:  EntrepreneurPulse.com.au  Fact Sheet for Healthcare Providers:  IncredibleEmployment.be  This test is no t yet approved or cleared by the [place] FDA and  has been authorized for detection and/or diagnosis of SARS-CoV-2 by FDA under an Emergency Use Authorization (EUA). This EUA will remain  in effect (meaning this test can be used) for the duration of the COVID-19 declaration under Section 564(b)(1) of the Act, 21 U.S.C.section 360bbb-3(b)(1), unless the authorization is terminated  or revoked sooner.       Influenza A by PCR NEGATIVE NEGATIVE Final   Influenza B by PCR NEGATIVE NEGATIVE Final    Comment: (NOTE) The Xpert Xpress SARS-CoV-2/FLU/RSV plus assay is intended as an aid in the diagnosis of influenza from Nasopharyngeal swab specimens and should not be used as a sole basis for treatment. Nasal washings and aspirates are unacceptable for Xpert Xpress SARS-CoV-2/FLU/RSV testing.  Fact Sheet for Patients: EntrepreneurPulse.com.au  Fact Sheet for Healthcare Providers: IncredibleEmployment.be  This test is not yet approved or cleared by the [place] FDA and has been authorized for detection and/or diagnosis of SARS-CoV-2 by FDA under an Emergency Use Authorization (EUA). This EUA will remain in effect (meaning this test can be used) for the duration of the COVID-19 declaration under Section 564(b)(1) of the Act, 21 U.S.C. section 360bbb-3(b)(1), unless the authorization is terminated or revoked.  Performed at [facility], [place] [address], [place], [place] [address]   Culture, blood (routine x 2)     Status: Abnormal   Collection Time: 11/17/21  5:10 AM   Specimen: BLOOD  Result Value Ref Range Status   Specimen Description   Final    BLOOD BLOOD LEFT  HAND Performed at [facility] [address], [place], [place] [address]    Special Requests   Final    BOTTLES DRAWN AEROBIC ONLY Blood Culture adequate volume Performed at [facility] [address], [place], [place] [address]    Culture  Setup Time   Final    YEAST AEROBIC BOTTLE ONLY CRITICAL VALUE NOTED.  VALUE IS CONSISTENT WITH PREVIOUSLY REPORTED AND CALLED VALUE.    Culture (A)  Final    CANDIDA ALBICANS SUSCEPTIBILITIES PERFORMED ON PREVIOUS CULTURE WITHIN THE LAST 5 DAYS. Performed at [facility], [place] [address], [place], [place] [address]    Report Status 11/23/2021 FINAL  Final  Culture, blood (routine x 2)     Status: Abnormal   Collection Time: 11/17/21  5:20 AM   Specimen: BLOOD  Result Value Ref Range Status  Specimen Description   Final    BLOOD BLOOD LEFT WRIST Performed at [facility] [address] [place], [place] [address]    Special Requests   Final    BOTTLES DRAWN AEROBIC ONLY Blood Culture adequate volume Performed at [facility] [address] [place], [place] [address]    Culture  Setup Time   Final    YEAST AEROBIC BOTTLE ONLY CRITICAL RESULT CALLED TO, READ BACK BY AND VERIFIED WITH: A PHAM PHARMD @0821  11/18/21 [place]    Culture (A)  Final    CANDIDA ALBICANS SEE SEPARATE REPORT Performed at [facility], [address] [place], [place] [address]    Report Status 11/23/2021 FINAL  Final  Blood Culture ID Panel (Reflexed)     Status: Abnormal   Collection Time: 11/17/21  5:20 AM  Result Value Ref Range Status   Enterococcus faecalis NOT DETECTED NOT DETECTED Final   Enterococcus Faecium NOT DETECTED NOT DETECTED Final   Listeria monocytogenes NOT DETECTED NOT DETECTED Final   Staphylococcus species NOT DETECTED NOT DETECTED Final   Staphylococcus aureus (BCID) NOT DETECTED NOT DETECTED Final   Staphylococcus epidermidis NOT DETECTED NOT  DETECTED Final   Staphylococcus lugdunensis NOT DETECTED NOT DETECTED Final   Streptococcus species NOT DETECTED NOT DETECTED Final   Streptococcus agalactiae NOT DETECTED NOT DETECTED Final   Streptococcus pneumoniae NOT DETECTED NOT DETECTED Final   Streptococcus pyogenes NOT DETECTED NOT DETECTED Final   A.calcoaceticus-baumannii NOT DETECTED NOT DETECTED Final   Bacteroides fragilis NOT DETECTED NOT DETECTED Final   Enterobacterales NOT DETECTED NOT DETECTED Final   Enterobacter cloacae complex NOT DETECTED NOT DETECTED Final   Escherichia coli NOT DETECTED NOT DETECTED Final   Klebsiella aerogenes NOT DETECTED NOT DETECTED Final   Klebsiella oxytoca NOT DETECTED NOT DETECTED Final   Klebsiella pneumoniae NOT DETECTED NOT DETECTED Final   Proteus species NOT DETECTED NOT DETECTED Final   Salmonella species NOT DETECTED NOT DETECTED Final   Serratia marcescens NOT DETECTED NOT DETECTED Final   Haemophilus influenzae NOT DETECTED NOT DETECTED Final   Neisseria meningitidis NOT DETECTED NOT DETECTED Final   Pseudomonas aeruginosa NOT DETECTED NOT DETECTED Final   Stenotrophomonas maltophilia NOT DETECTED NOT DETECTED Final   Candida albicans DETECTED (A) NOT DETECTED Final    Comment: CRITICAL RESULT CALLED TO, READ BACK BY AND VERIFIED WITH: A PHAM PHARMD @0821  11/18/21 [place]    Candida auris NOT DETECTED NOT DETECTED Final   Candida glabrata NOT DETECTED NOT DETECTED Final   Candida krusei NOT DETECTED NOT DETECTED Final   Candida parapsilosis NOT DETECTED NOT DETECTED Final   Candida tropicalis NOT DETECTED NOT DETECTED Final   Cryptococcus neoformans/gattii NOT DETECTED NOT DETECTED Final    Comment: Performed at [facility], [address], [place], [place] [address]  Antifungal AST 9 Drug Panel     Status: None   Collection Time: 11/17/21  5:20 AM  Result Value Ref Range Status   Organism ID, Yeast Candida albicans  Corrected    Comment: (NOTE) Identification performed  by account, not confirmed by this laboratory. CORRECTED ON 11/24 AT 1336: PREVIOUSLY REPORTED AS Preliminary report    Amphotericin B MIC 0.5 ug/mL  Final    Comment: (NOTE) Breakpoints have been established for only some organism-drug combinations as indicated. This test was developed and its performance characteristics determined by [facility]. It has not been cleared or approved by the Food and Drug Administration.  Anidulafungin MIC Comment  Final    Comment: (NOTE) 0.016 ug/mL Susceptible Breakpoints have been established for only some organism-drug combinations as indicated. This test was developed and its performance characteristics determined by [facility]. It has not been cleared or approved by the Food and Drug Administration.    Caspofungin MIC Comment  Final    Comment: (NOTE) 0.016 ug/mL Susceptible Breakpoints have been established for only some organism-drug combinations as indicated. This test was developed and its performance characteristics determined by [facility]. It has not been cleared or approved by the Food and Drug Administration.    Micafungin MIC Comment  Final    Comment: (NOTE) 0.016 ug/mL Susceptible Breakpoints have been established for only some organism-drug combinations as indicated. This test was developed and its performance characteristics determined by [facility]. It has not been cleared or approved by the Food and Drug Administration.    Posaconazole MIC 0.03 ug/mL  Final    Comment: (NOTE) Breakpoints have been established for only some organism-drug combinations as indicated. This test was developed and its performance characteristics determined by [facility]. It has not been cleared or approved by the Food and Drug Administration.    Fluconazole Islt MIC 0.5 ug/mL Susceptible  Final    Comment: (NOTE) Breakpoints have been established for only some organism-drug combinations as indicated. This test was developed and its performance  characteristics determined by [facility]. It has not been cleared or approved by the Food and Drug Administration.    Flucytosine MIC 1.0 ug/mL  Final    Comment: (NOTE) Breakpoints have been established for only some organism-drug combinations as indicated. This test was developed and its performance characteristics determined by [facility]. It has not been cleared or approved by the Food and Drug Administration.    Itraconazole MIC 0.12 ug/mL  Final    Comment: (NOTE) Breakpoints have been established for only some organism-drug combinations as indicated. This test was developed and its performance characteristics determined by [facility]. It has not been cleared or approved by the Food and Drug Administration.    Voriconazole MIC Comment  Final    Comment: (NOTE) 0.016 ug/mL Susceptible Breakpoints have been established for only some organism-drug combinations as indicated. This test was developed and its performance characteristics determined by [facility]. It has not been cleared or approved by the Food and Drug Administration. Performed At: [facility] [place], [place] [id number] Rush Farmer MD [id number]    Source [id number] SORUCE BLD CBenjamine Sprague SENSI  Final    Comment: Performed at [facility], [place] [address] [place], [place] [address]  MRSA Next Gen by PCR, Nasal     Status: None   Collection Time: 11/17/21  6:24 PM   Specimen: Nasal Mucosa; Nasal Swab  Result Value Ref Range Status   MRSA by PCR Next Gen NOT DETECTED NOT DETECTED Final    Comment: (NOTE) The GeneXpert MRSA Assay (FDA approved for NASAL specimens only), is one component of a comprehensive MRSA colonization surveillance program. It is not intended to diagnose MRSA infection nor to guide or monitor treatment for MRSA infections. Test performance is not FDA approved in patients less than 60 years old. Performed at [facility], [place] [address], [place], [place] [address]   C Difficile Quick Screen (NO PCR Reflex)     Status: None   Collection Time: 11/17/21  8:40 PM   Specimen: STOOL  Result Value Ref Range Status   C Diff antigen NEGATIVE NEGATIVE Final  C Diff toxin NEGATIVE NEGATIVE Final   C Diff interpretation NEGATIVE  Final    Comment: Performed at [facility], [place] [address], [place], [place] [address]  Aerobic/Anaerobic Culture w Gram Stain (surgical/deep wound)     Status: None   Collection Time: 11/18/21 11:01 AM   Specimen: Abdomen; Abscess  Result Value Ref Range Status   Specimen Description   Final    ABDOMEN Performed at [facility] [address], [place], [place] [address]    Special Requests   Final    ABSCES Performed at [facility], [place] [address], [place], [place] [address]    Gram Stain   Final    MODERATE WBC PRESENT,BOTH PMN AND MONONUCLEAR NO ORGANISMS SEEN    Culture   Final    RARE ESCHERICHIA COLI RARE CANDIDA ALBICANS NO ANAEROBES ISOLATED Performed at [facility], [address] [place], [place] [address]    Report Status 11/23/2021 FINAL  Final   Organism ID, Bacteria ESCHERICHIA COLI  Final      Susceptibility   Escherichia coli - MIC*    AMPICILLIN <=2 SENSITIVE Sensitive     CEFAZOLIN <=4 SENSITIVE Sensitive     CEFEPIME <=0.12 SENSITIVE Sensitive     CEFTAZIDIME <=1 SENSITIVE Sensitive     CEFTRIAXONE <=0.25 SENSITIVE Sensitive     CIPROFLOXACIN <=0.25 SENSITIVE Sensitive     GENTAMICIN <=1 SENSITIVE Sensitive     IMIPENEM <=0.25 SENSITIVE Sensitive     TRIMETH/SULFA <=20 SENSITIVE Sensitive     AMPICILLIN/SULBACTAM <=2 SENSITIVE Sensitive     PIP/TAZO <=4 SENSITIVE Sensitive     * RARE ESCHERICHIA COLI  Fungus Stain     Status: None   Collection Time: 11/18/21 11:01 AM   Specimen: Abscess  Result Value Ref Range Status   FUNGUS STAIN Final report  Final    Comment: (NOTE) Performed At: [facility] [address] [place], [place] [id number] Rush Farmer MD IO:[id number]    Fungal Source ABSCESS  Final    Comment: Performed at [facility], [place] [address], [place], [place] [address]  Fungal Stain reflex     Status: None   Collection Time: 11/18/21 11:01 AM  Result Value Ref Range Status   Fungal stain result 1 Comment  Final    Comment: (NOTE) KOH/Calcofluor preparation:  no fungus observed. Performed At: [facility] [place], [place] [id number] Rush Farmer MD FY:[id number]   Culture, fungus without smear     Status: Abnormal (Preliminary result)   Collection Time: 11/18/21 11:02 AM   Specimen: Abscess; Other  Result Value Ref Range Status   Specimen Description   Final    ABSCESS Performed at [facility] [address], [place], [place] [address]    Special Requests ABDOMEN  Final   Culture (A)  Final    CANDIDA ALBICANS CONTINUING TO HOLD Performed at [facility], [place] [address] [place], [place] [address]    Report Status PENDING  Incomplete  Urine Culture     Status: Abnormal   Collection Time: 11/18/21  3:03 PM   Specimen: Urine, Clean Catch  Result Value Ref Range Status   Specimen Description   Final    URINE, CLEAN CATCH Performed at [facility], [place] [address] [place], [place] [address]    Special Requests   Final    NONE Performed at [facility], [address] [place], [place] [address]    Culture (A)  Final    <10,000 COLONIES/mL INSIGNIFICANT GROWTH Performed at [facility] [address] [place], [place] [address]    Report Status 11/20/2021 FINAL  Final  Culture, blood (routine x 2)     Status: None (Preliminary result)   Collection Time: 11/18/21  7:27 PM   Specimen: BLOOD  Result Value Ref Range Status   Specimen Description   Final    BLOOD BLOOD LEFT WRIST Performed at [facility] [address], [place], [place] [address]    Special Requests   Final    BOTTLES DRAWN AEROBIC ONLY Blood Culture adequate volume Performed at [facility] [address], [place], [place] [address]    Culture  Setup Time   Final    YEAST AEROBIC BOTTLE ONLY CRITICAL VALUE NOTED.  VALUE IS CONSISTENT WITH PREVIOUSLY REPORTED AND CALLED VALUE. Performed at [facility], [place] [address] [place], [place] [address]    Culture YEAST  Final   Report Status PENDING  Incomplete  Fungus culture, blood     Status: None (Preliminary result)   Collection Time: 11/18/21  7:28 PM   Specimen: BLOOD  Result Value Ref Range Status   Specimen Description   Final    BLOOD BLOOD LEFT HAND Performed at [facility] [address] [place], [place] [address]    Special Requests   Final    BOTTLES DRAWN AEROBIC ONLY Blood Culture adequate volume Performed at [facility] [address], [place], [place] [address]    Fungal Smear   Final    YEAST AEROBIC BOTTLE ONLY CRITICAL VALUE NOTED.  VALUE IS CONSISTENT WITH PREVIOUSLY REPORTED AND CALLED VALUE.    Culture   Final    NO GROWTH 4 DAYS Performed at [facility], [place] [address], [place], [place] [address]    Report Status PENDING  Incomplete  Culture, blood (routine x 2)     Status: None (Preliminary result)   Collection Time: 11/18/21  7:28 PM   Specimen: BLOOD  Result Value Ref Range Status   Specimen Description   Final    BLOOD BLOOD LEFT HAND Performed at [facility] [address], [place], [place] [address]    Special Requests   Final    BOTTLES DRAWN AEROBIC ONLY Blood Culture adequate volume Performed at [facility] [address], [place], [place] [address]    Culture   Final    NO GROWTH 4 DAYS Performed at [facility], [place] [address] [place], [place] [address]    Report Status PENDING  Incomplete     Studies/Results: DG Abd 1 View  Result Date: 11/24/2021 CLINICAL DATA:  80 year old male status post feeding tube placement. EXAM: ABDOMEN - 1 VIEW COMPARISON:  11/16/2021 FINDINGS: Weighted tip enteric feeding tube distal tip is at the level of the pylorus/first portion of the duodenum. Nonspecific bowel gas pattern. Similar appearance of indwelling bilateral nephroureteral stents. Partially visualized lumbar spinal fusion hardware. IMPRESSION: Enteric feeding tube weighted tip is located at the level of the pylorus/first portion of the duodenum. Electronically Signed   By: Ruthann Cancer M.D.   On: 11/24/2021 13:35   DG Chest Port 1 View  Result Date: 11/23/2021 CLINICAL DATA:  PICC line placement. EXAM: PORTABLE CHEST 1 VIEW COMPARISON:  11/17/2021 studies FINDINGS: A LEFT-sided PICC line is  noted with tip overlying the mid-LOWER SVC. A RIGHT PICC line is again identified with tip in the region of the SUPERIOR cavoatrial junction. New diffuse bilateral airspace opacities are noted. No pneumothorax or large pleural effusion identified. No acute bony abnormalities are present. IMPRESSION: 1. LEFT PICC line placement with tip overlying the mid-LOWER SVC 2. New diffuse bilateral airspace opacities, question edema or infection. Electronically Signed   By: Margarette Canada M.D.   On: 11/23/2021 11:49   VAS [place] UPPER EXTREMITY VENOUS DUPLEX  Result Date: 11/24/2021 UPPER VENOUS STUDY  Patient Name:  William [name]  Date of Exam:   11/24/2021 Medical Rec #: [id number]       Accession #:    [id number] Date of Birth: [birth year]       Patient Gender: M Patient Age:   80 years Exam Location:  [facility] Procedure:      VAS [place] UPPER EXTREMITY VENOUS DUPLEX Referring Phys: Leani Myron VAN DAM --------------------------------------------------------------------------------  Indications: Swelling Risk Factors: PICC line placement. Comparison Study: Previous BUEV on 11/14/21 was negative for DVT. Performing  Technologist: Charmayne Sheer  Examination Guidelines: A complete evaluation includes B-mode imaging, spectral Doppler, color Doppler, and power Doppler as needed of all accessible portions of each vessel. Bilateral testing is considered an integral part of a complete examination. Limited examinations for reoccurring indications may be performed as noted.  Right Findings: +----------+------------+---------+-----------+----------+-------+ RIGHT     CompressiblePhasicitySpontaneousPropertiesSummary +----------+------------+---------+-----------+----------+-------+ Subclavian    Full       Yes       Yes                      +----------+------------+---------+-----------+----------+-------+  Left Findings: +----------+------------+---------+-----------+----------+-------+ LEFT      CompressiblePhasicitySpontaneousPropertiesSummary +----------+------------+---------+-----------+----------+-------+ IJV           Full       Yes       Yes                      +----------+------------+---------+-----------+----------+-------+ Subclavian    Full       Yes       Yes                      +----------+------------+---------+-----------+----------+-------+ Axillary      Full       Yes       Yes                      +----------+------------+---------+-----------+----------+-------+ Brachial      Full       Yes       Yes                      +----------+------------+---------+-----------+----------+-------+ Radial        Full                                          +----------+------------+---------+-----------+----------+-------+ Ulnar         Full                                          +----------+------------+---------+-----------+----------+-------+ Cephalic      None       No  No                Acute  +----------+------------+---------+-----------+----------+-------+ Basilic       Full       Yes       Yes                       +----------+------------+---------+-----------+----------+-------+ Visualization of the brachial and basilic veins were limited due to PICC line placement.  Summary:  Right: No evidence of thrombosis in the subclavian.  Left: No evidence of deep vein thrombosis in the upper extremity. Findings consistent with acute superficial vein thrombosis involving the left cephalic vein.  *See table(s) above for measurements and observations.    Preliminary    VAS [place] UPPER EXTREMITY VENOUS DUPLEX  Result Date: 11/24/2021 UPPER VENOUS STUDY  Patient Name:  DORA CLAUSS  Date of Exam:   11/23/2021 Medical Rec #: [id number]       Accession #:    [id number] Date of Birth: [birth year]       Patient Gender: M Patient Age:   7 years Exam [facility] Procedure:      VAS [place] UPPER EXTREMITY VENOUS DUPLEX Referring Phys: Chrissy Ealey VAN DAM --------------------------------------------------------------------------------  Indications: RUE swelling Risk Factors: PICC line placement. Limitations: Line and bandages. Comparison Study: Previous exam of BUE on 11/14/2021 was negative for DVT. Performing Technologist: Rogelia Rohrer RVT, RDMS  Examination Guidelines: A complete evaluation includes B-mode imaging, spectral Doppler, color Doppler, and power Doppler as needed of all accessible portions of each vessel. Bilateral testing is considered an integral part of a complete examination. Limited examinations for reoccurring indications may be performed as noted.  Right Findings: +----------+------------+---------+-----------+----------+---------------------+ RIGHT     CompressiblePhasicitySpontaneousProperties       Summary        +----------+------------+---------+-----------+----------+---------------------+ IJV           Full       Yes       Yes                                    +----------+------------+---------+-----------+----------+---------------------+ Subclavian    Full       Yes       Yes                                     +----------+------------+---------+-----------+----------+---------------------+ Axillary      Full       Yes       Yes                                    +----------+------------+---------+-----------+----------+---------------------+ Brachial    Partial      No        No                  Acute in one of                                                            paired veins      +----------+------------+---------+-----------+----------+---------------------+ Radial  Full                                                        +----------+------------+---------+-----------+----------+---------------------+ Ulnar         Full                                                        +----------+------------+---------+-----------+----------+---------------------+ Cephalic      Full                                                        +----------+------------+---------+-----------+----------+---------------------+ Basilic       None       No        No                       Acute         +----------+------------+---------+-----------+----------+---------------------+ Difficulty imaging brachial and basilic veins due to PICC line placement.  Left Findings: +----------+------------+---------+-----------+----------+-------+ LEFT      CompressiblePhasicitySpontaneousPropertiesSummary +----------+------------+---------+-----------+----------+-------+ Subclavian    Full       Yes       Yes                      +----------+------------+---------+-----------+----------+-------+  Summary:  Right: Findings consistent with acute deep vein thrombosis involving the right brachial veins. Findings consistent with acute superficial vein thrombosis involving the right basilic vein.  Left: No evidence of thrombosis in the subclavian.  *See table(s) above for measurements and observations.  Diagnosing physician: Orlie Pollen Electronically  signed by Orlie Pollen on 11/24/2021 at 10:48:00 AM.    Final    [place] EKG SITE RITE  Result Date: 11/22/2021 If Site Rite image not attached, placement could not be confirmed due to current cardiac rhythm.     Assessment/Plan:  INTERVAL HISTORY: Patient had PICC removed from right side where he subsequently found to have a DVT he had PICC line placed in the left side  His blood cultures from the 19th are also growing yeast in 2/2 sites  Principal Problem:   Septic shock ([place]) Active Problems:   Chronic pain   Bladder cancer ([place])   Bacteremia due to Gram-negative bacteria   UTI due to Klebsiella species   Acute kidney injury ([place])   Intra-abdominal abscess ([place])   S/P PICC central line placement   Fungemia   Swelling of joint of upper arm, right    GLENWOOD REVOIR is a 80 y.o. male with with bladder cancer with ileal conduit admitted initially for pyelonephritis due to Klebsiella oxytoca with bacteremia.  He separately developed Candida albicans fungemia in the setting of intra-abdominal and perinephric abscess that was drained on November 19.  #1 Candida albicans fungemia due to intra-abdominal abscess but also with PICC line that was in place while he was actively [place].  PICC on the right side was placed on the 17th the day before his cultures turned positive for Candida.  Note Candida are notorious for adhering to foreign bodies in particular plastic such as in a PICC line.  His repeat blood cultures from 19 November with his right sided PICC in place are continuing to grow yeast  I am repeating blood cultures  His antifungal susceptibilities show that his Candida albicans is susceptible to  He has now had removal of his right sided PICC where he was found to have a DVT and he has now a left-sided PICC line where unfortunately he also has superficial thrombosis   In terms of treating his fungemia it would be more ideal if he was without any PICC lines.  We could  certainly treat him with oral fluconazole if he can take pills  He definitely does need a dedicated funduscopic exam to exclude f fungal endophthalmitis  #2 Klebsiella oxytoca bacteremia: He has received sufficient antibiotics for this.  3.  Intra-abdominal abscess he had been on Zosyn and Eraxis   Zosyn was supposed to have been stopped on Friday but was continued it is now been stopped after Ysidro Evert friends alerted me to the fact that still being given  4.  Right sided DVT now on anticoagulation  5 left-sided pain near PICC line I ordered Doppler which now shows a superficial thrombus   I spent more than 36 minutes with the patient including face to face counseling of the patient regarding his fungemia is intra-abdominal abscesses DVT is left upper extremity pain personally reviewing r his updated blood cultures CBC BMP Doppler studies along withreview of medical records before and during the visit and in coordination of his care.   LOS: 15 days   Alcide Evener 11/24/2021, 3:07 PM

## 2021-11-24 NOTE — Progress Notes (Signed)
Physical Therapy Treatment Patient Details Name: William [name] MRN: [id number] DOB: [birth year] Today's Date: 11/24/2021   History of Present Illness Patient  is a 80 year old male who presented from urology clinic on 11/10 with low blood pressure. patient was found to have septic shock. PMH: s/p robotic cystoprostatectomy, pelvic node dissection , conduit diversion, extensive adhesioloysis , small bowel resection and cysto / ICG injection on 10/25/21 due to bladder cancer, tinnitus, Covid-19, back surgery    PT Comments    Pt seen in [facility] Down ICU Room# 1239 Pt in bed on 4 lts sats 97%.  General Comments: AxO x 3 very pleasant man but very ill. Assisted OOB required increased time.  General bed mobility comments: patient needed physical assist with BLE and trunk with max A to scoot to edge of bed using bed pad to complete. General transfer comment: Mod Assist to rise from elevated bed with MAX poesterior lean/push and instability.  50% VC's on proper hand placement and safety.  Excessive grip on walker and 75% VC's to reach back prior to sit. General Gait Details: severe posterior lean and 75% VC's to advance walker "further out" to increase stability/balance.  Very limited amb distance due to increased HR to 155 and RR 33.  Recliner following behind.  Increased c/o back pain with activity. Positioned in recliner to comfort.  RN still in room.    Recommendations for follow up therapy are one component of a multi-disciplinary discharge planning process, led by the attending physician.  Recommendations may be updated based on patient status, additional functional criteria and insurance authorization.  Follow Up Recommendations  Skilled nursing-short term rehab (<3 hours/day)     Assistance Recommended at Discharge Frequent or constant Supervision/Assistance  Equipment Recommendations  None recommended by PT    Recommendations for Other Services       Precautions / Restrictions  Precautions Precautions: None Precaution Comments: urostomy, left drain,fecal tube, oxygen Restrictions Weight Bearing Restrictions: No     Mobility  Bed Mobility Overal bed mobility: Needs Assistance Bed Mobility: Supine to Sit     Supine to sit: Max assist;HOB elevated     General bed mobility comments: patient needed physical assist with BLE and trunk with max A to scoot to edge of bed using bed pad to complete.    Transfers Overall transfer level: Needs assistance Equipment used: Rolling walker (2 wheels) Transfers: Sit to/from Stand Sit to Stand: From elevated surface;Mod assist           General transfer comment: Mod Assist to rise from elevated bed with MAX poesterior lean/push and instability.  50% VC's on proper hand placement and safety.  Excessive grip on walker and 75% VC's to reach back prior to sit.    Ambulation/Gait Ambulation/Gait assistance: Mod assist Gait Distance (Feet): 5 Feet Assistive device: Rolling walker (2 wheels) Gait Pattern/deviations: Step-through pattern;Decreased stride length;Wide base of support Gait velocity: decreased     General Gait Details: severe posterior lean and 75% VC's to advance walker "further out" to increase stability/balance.  Very limited amb distance due to increased HR to 155 and RR 33.  Recliner following behind.  Increased c/o back pain with activity.   Stairs             Wheelchair Mobility    Modified Rankin (Stroke Patients Only)       Balance  Cognition Arousal/Alertness: Awake/alert Behavior During Therapy: WFL for tasks assessed/performed;Flat affect Overall Cognitive Status: No family/caregiver present to determine baseline cognitive functioning                                 General Comments: AxO x 3 very pleasant man but very ill        Exercises      General Comments        Pertinent Vitals/Pain  Pain Assessment: Faces Pain Location: L lower back and mouth Pain Descriptors / Indicators: Discomfort;Grimacing    Home Living                          Prior Function            PT Goals (current goals can now be found in the care plan section) Progress towards PT goals: Progressing toward goals    Frequency    Min 3X/week      PT Plan Current plan remains appropriate    Co-evaluation              AM-PAC PT "6 Clicks" Mobility   Outcome Measure  Help needed turning from your back to your side while in a flat bed without using bedrails?: A Lot Help needed moving from lying on your back to sitting on the side of a flat bed without using bedrails?: A Lot Help needed moving to and from a bed to a chair (including a wheelchair)?: Total Help needed standing up from a chair using your arms (e.g., wheelchair or bedside chair)?: Total Help needed to walk in hospital room?: Total Help needed climbing 3-5 steps with a railing? : Total 6 Click Score: 8    End of Session Equipment Utilized During Treatment: Gait belt Activity Tolerance: Treatment limited secondary to medical complications (Comment) Patient left: in chair;with call bell/phone within reach;with chair alarm set;with nursing/sitter in room Nurse Communication: Mobility status;Other (comment) (RN stayed in room during session) PT Visit Diagnosis: Difficulty in walking, not elsewhere classified (R26.2);Muscle weakness (generalized) (M62.81);Pain     Time: 1400-1435 PT Time Calculation (min) (ACUTE ONLY): 35 min  Charges:  $Gait Training: 8-22 mins $Therapeutic Activity: 8-22 mins                    Rica Koyanagi  PTA Acute  Rehabilitation Services Pager      [phone] Office      [phone]

## 2021-11-24 NOTE — Progress Notes (Signed)
Referring Physician(s): Hall,C/Wakefield,M  Supervising Physician: Michaelle Birks  Patient Status:  [name] - In-pt  Chief Complaint: Lower abdominal fluid collection   Subjective: Pt just finished PT; sitting up in chair; left abdominal drain OP about 275 cc yellow fluid- creat level pend   Allergies: Hydrocodone  Medications: Prior to Admission medications   Medication Sig Start Date End Date Taking? Authorizing Provider  acetaminophen (TYLENOL) 500 MG tablet Take 1,000 mg by mouth every 4 (four) hours as needed for mild pain or fever.   Yes [provider]  fluticasone (FLONASE) 50 MCG/ACT nasal spray Place 2 sprays into both nostrils daily. 01/05/20  Yes [name], MD  gabapentin (NEURONTIN) 300 MG capsule TAKE 3 CAPSULES BY MOUTH AT BEDTIME Patient taking differently: Take 900 mg by mouth at bedtime. 03/09/21  Yes Patel, [name], DO  ibuprofen (ADVIL) 200 MG tablet Take 600 mg by mouth every 4 (four) hours as needed for mild pain.   Yes [provider]  ondansetron (ZOFRAN-ODT) 8 MG disintegrating tablet Take 8 mg by mouth every 8 (eight) hours as needed for nausea/vomiting. 11/06/21  Yes [provider]  oxyCODONE-acetaminophen (PERCOCET) 5-325 MG tablet Take 1 tablet by mouth every 6 (six) hours as needed for severe pain. 11/02/21 11/02/22 Yes [name], MD  SPIRIVA HANDIHALER 18 MCG inhalation capsule PLACE 1 CAPSULE( 18 MCG TOTAL) INTO INHALER AND INHALE DAILY Patient taking differently: Place 18 mcg into inhaler and inhale daily. 08/28/21  Yes [name], MD  tiZANidine (ZANAFLEX) 4 MG tablet Take 4 mg by mouth at bedtime.   Yes [provider]     Vital Signs: BP (!) 165/102   Pulse (!) 107   Temp (!) 97.4 F (36.3 C) (Oral)   Resp (!) 24   Ht 5\' 7"  (1.702 m)   Wt 182 lb 8.7 oz (82.8 kg)   SpO2 92%   BMI 28.59 kg/m   Physical Exam: awake, not very engaged; LLQ drain intact, insertion site ok, OP 275 cc  yellow fluid with tissue fragments; cath flushes ok  Imaging: DG Abd 1 View  Result Date: 11/24/2021 CLINICAL DATA:  80 year old male status post feeding tube placement. EXAM: ABDOMEN - 1 VIEW COMPARISON:  11/16/2021 FINDINGS: Weighted tip enteric feeding tube distal tip is at the level of the pylorus/first portion of the duodenum. Nonspecific bowel gas pattern. Similar appearance of indwelling bilateral nephroureteral stents. Partially visualized lumbar spinal fusion hardware. IMPRESSION: Enteric feeding tube weighted tip is located at the level of the pylorus/first portion of the duodenum. Electronically Signed   By: Ruthann Cancer M.D.   On: 11/24/2021 13:35   DG Chest Port 1 View  Result Date: 11/23/2021 CLINICAL DATA:  PICC line placement. EXAM: PORTABLE CHEST 1 VIEW COMPARISON:  11/17/2021 studies FINDINGS: A LEFT-sided PICC line is noted with tip overlying the mid-LOWER SVC. A RIGHT PICC line is again identified with tip in the region of the SUPERIOR cavoatrial junction. New diffuse bilateral airspace opacities are noted. No pneumothorax or large pleural effusion identified. No acute bony abnormalities are present. IMPRESSION: 1. LEFT PICC line placement with tip overlying the mid-LOWER SVC 2. New diffuse bilateral airspace opacities, question edema or infection. Electronically Signed   By: Margarette Canada M.D.   On: 11/23/2021 11:49   VAS [place] UPPER EXTREMITY VENOUS DUPLEX  Result Date: 11/24/2021 UPPER VENOUS STUDY  Patient Name:  William [name]  Date of Exam:   11/24/2021 Medical Rec #: [id number]  Accession #:    [id number] Date of Birth: 03/11/1941       Patient Gender: M Patient Age:   8 years Exam Location:  [facility] Procedure:      VAS [place] UPPER EXTREMITY VENOUS DUPLEX Referring Phys: CORNELIUS VAN DAM --------------------------------------------------------------------------------  Indications: Swelling Risk Factors: PICC line placement. Comparison Study: Previous BUEV on  11/14/21 was negative for DVT. Performing Technologist: Charmayne Sheer  Examination Guidelines: A complete evaluation includes B-mode imaging, spectral Doppler, color Doppler, and power Doppler as needed of all accessible portions of each vessel. Bilateral testing is considered an integral part of a complete examination. Limited examinations for reoccurring indications may be performed as noted.  Right Findings: +----------+------------+---------+-----------+----------+-------+ RIGHT     CompressiblePhasicitySpontaneousPropertiesSummary +----------+------------+---------+-----------+----------+-------+ Subclavian    Full       Yes       Yes                      +----------+------------+---------+-----------+----------+-------+  Left Findings: +----------+------------+---------+-----------+----------+-------+ LEFT      CompressiblePhasicitySpontaneousPropertiesSummary +----------+------------+---------+-----------+----------+-------+ IJV           Full       Yes       Yes                      +----------+------------+---------+-----------+----------+-------+ Subclavian    Full       Yes       Yes                      +----------+------------+---------+-----------+----------+-------+ Axillary      Full       Yes       Yes                      +----------+------------+---------+-----------+----------+-------+ Brachial      Full       Yes       Yes                      +----------+------------+---------+-----------+----------+-------+ Radial        Full                                          +----------+------------+---------+-----------+----------+-------+ Ulnar         Full                                          +----------+------------+---------+-----------+----------+-------+ Cephalic      None       No        No                Acute  +----------+------------+---------+-----------+----------+-------+ Basilic       Full       Yes       Yes                       +----------+------------+---------+-----------+----------+-------+ Visualization of the brachial and basilic veins were limited due to PICC line placement.  Summary:  Right: No evidence of thrombosis in the subclavian.  Left: No evidence of deep vein thrombosis in the upper extremity. Findings consistent with acute superficial vein thrombosis involving the left cephalic vein.  *See table(s) above for measurements  and observations.    Preliminary    VAS [place] UPPER EXTREMITY VENOUS DUPLEX  Result Date: 11/24/2021 UPPER VENOUS STUDY  Patient Name:  William [name]  Date of Exam:   11/23/2021 Medical Rec #: [id number]       Accession #:    [id number] Date of Birth: [birth year]       Patient Gender: M Patient Age:   21 years Exam Location:  [facility] Procedure:      VAS [place] UPPER EXTREMITY VENOUS DUPLEX Referring Phys: CORNELIUS VAN DAM --------------------------------------------------------------------------------  Indications: RUE swelling Risk Factors: PICC line placement. Limitations: Line and bandages. Comparison Study: Previous exam of BUE on 11/14/2021 was negative for DVT. Performing Technologist: Rogelia Rohrer RVT, RDMS  Examination Guidelines: A complete evaluation includes B-mode imaging, spectral Doppler, color Doppler, and power Doppler as needed of all accessible portions of each vessel. Bilateral testing is considered an integral part of a complete examination. Limited examinations for reoccurring indications may be performed as noted.  Right Findings: +----------+------------+---------+-----------+----------+---------------------+ RIGHT     CompressiblePhasicitySpontaneousProperties       Summary        +----------+------------+---------+-----------+----------+---------------------+ IJV           Full       Yes       Yes                                    +----------+------------+---------+-----------+----------+---------------------+ Subclavian    Full       Yes        Yes                                    +----------+------------+---------+-----------+----------+---------------------+ Axillary      Full       Yes       Yes                                    +----------+------------+---------+-----------+----------+---------------------+ Brachial    Partial      No        No                  Acute in one of                                                            paired veins      +----------+------------+---------+-----------+----------+---------------------+ Radial        Full                                                        +----------+------------+---------+-----------+----------+---------------------+ Ulnar         Full                                                        +----------+------------+---------+-----------+----------+---------------------+  Cephalic      Full                                                        +----------+------------+---------+-----------+----------+---------------------+ Basilic       None       No        No                       Acute         +----------+------------+---------+-----------+----------+---------------------+ Difficulty imaging brachial and basilic veins due to PICC line placement.  Left Findings: +----------+------------+---------+-----------+----------+-------+ LEFT      CompressiblePhasicitySpontaneousPropertiesSummary +----------+------------+---------+-----------+----------+-------+ Subclavian    Full       Yes       Yes                      +----------+------------+---------+-----------+----------+-------+  Summary:  Right: Findings consistent with acute deep vein thrombosis involving the right brachial veins. Findings consistent with acute superficial vein thrombosis involving the right basilic vein.  Left: No evidence of thrombosis in the subclavian.  *See table(s) above for measurements and observations.  Diagnosing physician: Orlie Pollen Electronically signed by Orlie Pollen on 11/24/2021 at 10:48:00 AM.    Final    [place] EKG SITE RITE  Result Date: 11/22/2021 If Site Rite image not attached, placement could not be confirmed due to current cardiac rhythm.   Labs:  CBC: Recent Labs    11/21/21 1955 11/22/21 0500 11/23/21 0400 11/24/21 0549  WBC 9.3 9.2 8.3 10.2  HGB 9.7* 9.7* 9.0* 9.3*  HCT 30.0* 29.7* 27.7* 29.4*  PLT 315 308 315 375    COAGS: Recent Labs    06/29/21 0830 11/07/2021 1016 11/10/21 0500 11/18/21 1928  INR 1.0 1.4* 1.5* 1.3*  APTT  --  38*  --   --     BMP: Recent Labs    11/21/21 0515 11/22/21 0500 11/23/21 0400 11/24/21 0549  NA 136  136 137 141 146*  K 3.3*  3.2* 3.2* 3.2* 2.8*  CL 107  106 109 118* 116*  CO2 22  22 21* 20* 23  GLUCOSE 89  89 92 89 106*  BUN 26*  27* 23 17 10   CALCIUM 7.2*  7.1* 7.4* 7.7* 8.2*  CREATININE 2.45*  2.48* 1.84* 1.41* 0.88  GFRNONAA 26*  26* 37* 51* >60    LIVER FUNCTION TESTS: Recent Labs    11/20/21 0540 11/21/21 0515 11/22/21 0500 11/23/21 0400  BILITOT 0.7 0.7 1.0 1.0  AST 26 27 26 19   ALT 16 16 15 12   ALKPHOS 78 67 83 71  PROT 5.7* 5.2* 5.5* 5.8*  ALBUMIN 1.6* 1.5*  <1.5* <1.5* 2.3*    Assessment and Plan: Pt with hx bladder ca with prior cystectomy/conduit diversion/stenting/prostatectomy, SBR 10/25/21; s/p drainage of LLQ abd fluid collection 11/18/21;afebrile; fluid creat pending (r/o urine leak)- confirmed that specimen was sent ;WBC 10.2, hgb 9.3(9), K 2.8- replace; drain fl cx- rare e coli/candida;cont current tx; obtain f/u imaging once drain OP minimal or if WBC rises sig   Electronically Signed: D. Rowe Robert, PA-C 11/24/2021, 3:02 PM   I spent a total of 15 Minutes at the the patient's bedside AND on the patient's hospital floor or unit, greater than 50% of which was counseling/coordinating  care for left lower abdominal fluid collection drain    Patient ID: William [name], male   DOB: 1941/08/21,  80 y.o.   MRN: [id number]

## 2021-11-24 NOTE — Evaluation (Signed)
Clinical/Bedside Swallow Evaluation Patient Details  Name: William [name] MRN: [id number] Date of Birth: [birth year]  Today's Date: 11/24/2021 Time: SLP Start Time (ACUTE ONLY): 6945 SLP Stop Time (ACUTE ONLY): 1013 SLP Time Calculation (min) (ACUTE ONLY): 47 min  Past Medical History:  Past Medical History:  Diagnosis Date   Bladder cancer ([place])    Pt says small tumor not cancer   Bleeding gums 11/11/2015   COVID 09/23/2020   back ache hurt all over x few days all symptoms reolved   Dyslipidemia (high LDL; low HDL)    Low back pain 12/27/2014   Medial epicondylitis of right elbow 12/13/2014   Numbness and tingling of right leg    Tinnitus    all the time   Toenail fungus    2 big toes   Wears glasses    Past Surgical History:  Past Surgical History:  Procedure Laterality Date   bone chip removed from right heel  [place] Bilateral 02/08/2021   Procedure: [place];  Surgeon: [name], MD;  Location: [place];  Service: Urology;  Laterality: Bilateral;   CYSTOSCOPY WITH INJECTION N/A 10/25/2021   Procedure: CYSTOSCOPY WITH INJECTION OF INDOCYANINE GREEN DYE;  Surgeon: [name], MD;  Location: [facility];  Service: Urology;  Laterality: N/A;   FINGER AMPUTATION     injured by table saw, left 4th and 5th fingers   IR IMAGING GUIDED PORT INSERTION  03/07/2021   IR REMOVAL TUN ACCESS W/ PORT W/O FL MOD SED  06/29/2021   KNEE ARTHROSCOPY W/ MENISCAL REPAIR  2012 2014   left   KNEE ARTHROSCOPY W/ MENISCAL REPAIR  11/2011   left   LACERATION REPAIR  1962   forehead   LAPAROSCOPIC LYSIS OF ADHESIONS N/A 10/25/2021   Procedure: EXTENSIVE LAPAROSCOPIC LYSIS OF ADHESIONS;  Surgeon: [name], MD;  Location: [facility];  Service: Urology;  Laterality: N/A;   LUMBAR DISC SURGERY  09/2007   bone spurs, stenosis, L3-4, 4-5    LYMPH NODE DISSECTION Bilateral 10/25/2021   Procedure: LYMPH NODE DISSECTION;   Surgeon: [name], MD;  Location: [facility];  Service: Urology;  Laterality: Bilateral;   POSTERIOR LUMBAR FUSION 4 LEVEL N/A 10/13/2013   Procedure: Lumbar Two-Three, Lumbar Three-Four, Lumbar Four-Five, Lumbar Five-Sacral One posterior lumbar interbody fusion with interbody prosthesis posterior lateral arthrodesis and posterior segmental instrumentation;  Surgeon: [name], MD;  Location: [place] [facility];  Service: Neurosurgery;  Laterality: N/A;  POSTERIOR LUMBAR FUSION 4 LEVEL   ROBOT ASSISTED LAPAROSCOPIC COMPLETE CYSTECT ILEAL CONDUIT N/A 10/25/2021   Procedure: XI ROBOTIC ASSISTED LAPAROSCOPIC COMPLETE CYSTECT ILEAL CONDUIT/RADICAL PROSTATECTOMY AND SMALL BOWEL RESECTION;  Surgeon: [name], MD;  Location: [facility];  Service: Urology;  Laterality: N/A;  6 HRS   SKIN GRAFT     from right thigh to left forehead   SMALL INTESTINE SURGERY  0388   ileitis complications   TRANSURETHRAL RESECTION OF BLADDER TUMOR N/A 02/08/2021   Procedure: TRANSURETHRAL RESECTION OF BLADDER TUMOR (TURBT);  Surgeon: [name], MD;  Location: [facility];  Service: Urology;  Laterality: N/A;   HPI:  80 year old male who presented from urology clinic on 11/10 with low blood pressure. patient was found to have septic shock. PMH: s/p robotic cystoprostatectomy, pelvic node dissection , conduit diversion, extensive adhesioloysis , small bowel resection and cysto / ICG injection on 10/25/21 due to bladder cancer, tinnitus, Covid-19, back surgery. On 11/23  pt developed difficulty swallowing with frequent coughing on thin liquids.    Assessment / Plan / Recommendation  Clinical Impression  Pt, who has developed dysphagia past few days per RN and has been coughing with thin liquids, seen for swallow assessment. Pt has visible scab on lower lip and inspection of oral cavity revealed severe erythema, bloody ulcerations covering soft palate with mild active bleeding. Prolonged oral care with  toothette, Yankeurs, ice and teaspoon of thin water eventtually decreased  blood leaving several lesions but overall, much improved mucousa. He had mild coughing 20% of the time and throat clears. One sip nectar given without overt difficulty. Once oral cavity cleaned, observed with pills crushed in puree without significant concerns. Pt should not consume consistent po's at this time (no meal trays). Therapist recommends NPO except pills crushed in puree sitting upright. Pt needs frequent mositure in oral cavity to prevent further xerostomia with ice chips then separate spoons of water every several hours after oral care. Alternate means of nutrition needed- could he have TPN? Pt must have diligent and frequent oral care. ST to follow with goal of moving toward full po's . SLP Visit Diagnosis: Dysphagia, oropharyngeal phase (R13.12)    Aspiration Risk  Moderate aspiration risk    Diet Recommendation NPO except meds;Ice chips PRN after oral care   Liquid Administration via: Spoon Medication Administration: Crushed with puree Compensations: Small sips/bites Postural Changes: Seated upright at 90 degrees    Other  Recommendations Oral Care Recommendations: Oral care QID;Oral care prior to ice chip/H20    Recommendations for follow up therapy are one component of a multi-disciplinary discharge planning process, led by the attending physician.  Recommendations may be updated based on patient status, additional functional criteria and insurance authorization.  Follow up Recommendations Skilled nursing-short term rehab (<3 hours/day)      Assistance Recommended at Discharge    Functional Status Assessment    Frequency and Duration min 2x/week  2 weeks       Prognosis Prognosis for Safe Diet Advancement: Good      Swallow Study   General Date of Onset: 11/24/21 HPI: 80 year old male who presented from urology clinic on 11/10 with low blood pressure. patient was found to have septic shock.  PMH: s/p robotic cystoprostatectomy, pelvic node dissection , conduit diversion, extensive adhesioloysis , small bowel resection and cysto / ICG injection on 10/25/21 due to bladder cancer, tinnitus, Covid-19, back surgery. On 11/23 pt developed difficulty swallowing with frequent coughing on thin liquids. Type of Study: Bedside Swallow Evaluation Previous Swallow Assessment:  (none) Diet Prior to this Study: Dysphagia 3 (soft);Nectar-thick liquids Temperature Spikes Noted: No Respiratory Status: Nasal cannula History of Recent Intubation: No Behavior/Cognition: Alert;Cooperative Oral Cavity Assessment: Dry;Lesions;Dried secretions;Erythema Oral Care Completed by SLP: Yes Oral Cavity - Dentition: Poor condition;Missing dentition Vision: Functional for self-feeding Self-Feeding Abilities: Needs assist Patient Positioning: Upright in bed Baseline Vocal Quality: Low vocal intensity Volitional Cough: Weak Volitional Swallow: Able to elicit    Oral/Motor/Sensory Function Overall Oral Motor/Sensory Function: Within functional limits   Ice Chips Ice chips: Impaired Presentation: Spoon Pharyngeal Phase Impairments: Throat Clearing - Delayed;Multiple swallows   Thin Liquid Thin Liquid: Impaired Presentation: Spoon Pharyngeal  Phase Impairments: Throat Clearing - Immediate;Cough - Delayed;Throat Clearing - Delayed;Multiple swallows    Nectar Thick Nectar Thick Liquid: Impaired Pharyngeal Phase Impairments: Multiple swallows   Honey Thick Honey Thick Liquid: Not tested   Puree Puree: Impaired Pharyngeal Phase Impairments: Multiple swallows   Solid  Solid: Not tested      Houston Siren 11/24/2021,10:55 AM Orbie Pyo Colvin Caroli.Ed Risk analyst [phone] Office [phone]

## 2021-11-25 DIAGNOSIS — K651 Peritoneal abscess: Secondary | ICD-10-CM | POA: Diagnosis not present

## 2021-11-25 DIAGNOSIS — Z515 Encounter for palliative care: Secondary | ICD-10-CM

## 2021-11-25 DIAGNOSIS — Z7189 Other specified counseling: Secondary | ICD-10-CM

## 2021-11-25 DIAGNOSIS — R6521 Severe sepsis with septic shock: Secondary | ICD-10-CM | POA: Diagnosis not present

## 2021-11-25 DIAGNOSIS — A419 Sepsis, unspecified organism: Secondary | ICD-10-CM | POA: Diagnosis not present

## 2021-11-25 LAB — CULTURE, BLOOD (ROUTINE X 2): Special Requests: ADEQUATE

## 2021-11-25 LAB — CBC WITH DIFFERENTIAL/PLATELET
Abs Immature Granulocytes: 0.38 10*3/uL — ABNORMAL HIGH (ref 0.00–0.07)
Basophils Absolute: 0.1 10*3/uL (ref 0.0–0.1)
Basophils Relative: 1 %
Eosinophils Absolute: 0.2 10*3/uL (ref 0.0–0.5)
Eosinophils Relative: 2 %
HCT: 32 % — ABNORMAL LOW (ref 39.0–52.0)
Hemoglobin: 9.9 g/dL — ABNORMAL LOW (ref 13.0–17.0)
Immature Granulocytes: 4 %
Lymphocytes Relative: 18 %
Lymphs Abs: 1.6 10*3/uL (ref 0.7–4.0)
MCH: 29 pg (ref 26.0–34.0)
MCHC: 30.9 g/dL (ref 30.0–36.0)
MCV: 93.8 fL (ref 80.0–100.0)
Monocytes Absolute: 0.6 10*3/uL (ref 0.1–1.0)
Monocytes Relative: 6 %
Neutro Abs: 6.4 10*3/uL (ref 1.7–7.7)
Neutrophils Relative %: 69 %
Platelets: 390 10*3/uL (ref 150–400)
RBC: 3.41 MIL/uL — ABNORMAL LOW (ref 4.22–5.81)
RDW: 15.8 % — ABNORMAL HIGH (ref 11.5–15.5)
WBC: 9.2 10*3/uL (ref 4.0–10.5)
nRBC: 0.2 % (ref 0.0–0.2)

## 2021-11-25 LAB — GLUCOSE, CAPILLARY
Glucose-Capillary: 100 mg/dL — ABNORMAL HIGH (ref 70–99)
Glucose-Capillary: 87 mg/dL (ref 70–99)
Glucose-Capillary: 87 mg/dL (ref 70–99)
Glucose-Capillary: 112 mg/dL — ABNORMAL HIGH (ref 70–99)
Glucose-Capillary: 124 mg/dL — ABNORMAL HIGH (ref 70–99)
Glucose-Capillary: 119 mg/dL — ABNORMAL HIGH (ref 70–99)

## 2021-11-25 LAB — BASIC METABOLIC PANEL
Anion gap: 9 (ref 5–15)
Anion gap: 3 — ABNORMAL LOW (ref 5–15)
BUN: 12 mg/dL (ref 8–23)
BUN: 12 mg/dL (ref 8–23)
CO2: 26 mmol/L (ref 22–32)
CO2: 27 mmol/L (ref 22–32)
Calcium: 8.6 mg/dL — ABNORMAL LOW (ref 8.9–10.3)
Calcium: 8.7 mg/dL — ABNORMAL LOW (ref 8.9–10.3)
Chloride: 116 mmol/L — ABNORMAL HIGH (ref 98–111)
Chloride: 124 mmol/L — ABNORMAL HIGH (ref 98–111)
Creatinine, Ser: 0.83 mg/dL (ref 0.61–1.24)
Creatinine, Ser: 0.76 mg/dL (ref 0.61–1.24)
GFR, Estimated: >60 mL/min (ref >60)
GFR, Estimated: >60 mL/min (ref >60)
Glucose, Bld: 102 mg/dL — ABNORMAL HIGH (ref 70–99)
Glucose, Bld: 157 mg/dL — ABNORMAL HIGH (ref 70–99)
Potassium: 2.6 mmol/L — CL (ref 3.5–5.1)
Potassium: 3.6 mmol/L (ref 3.5–5.1)
Sodium: 151 mmol/L — ABNORMAL HIGH (ref 135–145)
Sodium: 154 mmol/L — ABNORMAL HIGH (ref 135–145)

## 2021-11-25 LAB — MAGNESIUM
Magnesium: 1.9 mg/dL (ref 1.7–2.4)
Magnesium: 1.9 mg/dL (ref 1.7–2.4)

## 2021-11-25 LAB — PHOSPHORUS
Phosphorus: 2.6 mg/dL (ref 2.5–4.6)
Phosphorus: 1.9 mg/dL — ABNORMAL LOW (ref 2.5–4.6)

## 2021-11-25 LAB — SODIUM, URINE, RANDOM: Sodium, Ur: 80 mmol/L

## 2021-11-25 LAB — OSMOLALITY, URINE: Osmolality, Ur: 282 mOsm/kg — ABNORMAL LOW (ref 300–900)

## 2021-11-25 MED ORDER — POTASSIUM CHLORIDE 10 MEQ/100ML IV SOLN
10.0000 meq | INTRAVENOUS | Status: AC
  Administered 2021-11-25 (×4): 10 meq via INTRAVENOUS
  Filled 2021-11-25: qty 100

## 2021-11-25 MED ORDER — METOPROLOL TARTRATE 25 MG PO TABS
25.0000 mg | ORAL_TABLET | Freq: Two times a day (BID) | ORAL | Status: DC
Start: 2021-11-25 — End: 2021-11-26
  Administered 2021-11-25 – 2021-11-26 (×2): 25 mg
  Filled 2021-11-25 (×3): qty 1

## 2021-11-25 MED ORDER — POTASSIUM CHLORIDE 20 MEQ PO PACK
40.0000 meq | PACK | ORAL | Status: AC
  Administered 2021-11-25 (×3): 40 meq
  Filled 2021-11-25 (×3): qty 2

## 2021-11-25 MED ORDER — NYSTATIN 100000 UNIT/ML MT SUSP
5.0000 mL | Freq: Four times a day (QID) | OROMUCOSAL | Status: DC
  Administered 2021-11-25 – 2021-11-29 (×17): 500000 [IU] via ORAL
  Filled 2021-11-25 (×16): qty 5

## 2021-11-25 NOTE — Consult Note (Signed)
Consultation Note Date: 11/25/2021   Patient Name: William [name]  DOB: Jul 11, 1941  MRN: [id number]  Age / Sex: 80 y.o., male  PCP: [name], MD Referring Physician: Damita Lack, MD  Reason for Consultation: Establishing goals of care  HPI/Patient Profile: 80 y.o. male admitted on 11/13/2021   Clinical Assessment and Goals of Care: 80 yo bladder cancer status post robot-assisted laparoscopic radical cystectomy/prostatectomy in October 2022 by [name], bilateral pelvic lymph node dissection, small bowel resection with ileal conduit urinary diversion.  Also has a history of COPD tobacco use.  Initially discharged on November 3 and returned back to the hospital November 09, 2021 due to hypotension.  Patient admitted to stepdown unit at [facility] in [place], [place] for septic shock secondary to urinary tract infection from multiple species, requiring vasopressors, hospital course complicated by ileus/partial small bowel obstruction, repeat abdominal imaging has shown large intra-abdominal abscess, cultures have grown Candida albicans and infectious diseases following for fungemia.  Patient remains on antifungal treatments.  Core track was also placed to initiate tube feeding.  Palliative consult for CODE STATUS and goals of care discussions has been requested. Patient has a had to have NG tube reinserted to continue with tube feeds.  He is awake and alert, he states that he does not like the NG tube, he verbalizes some and follows some commands, appears to have generalized weakness.  He gives me permission to contact his family, discussed with bedside RN and a call placed and discussed with the wife Rolfe Hartsell.  I introduced myself and palliative care as follows: Palliative medicine is specialized medical care for people living with serious illness. It focuses on providing  relief from the symptoms and stress of a serious illness. The goal is to improve quality of life for both the patient and the family. Goals of care: Broad aims of medical therapy in relation to the patient's values and preferences. Our aim is to provide medical care aimed at enabling patients to achieve the goals that matter most to them, given the circumstances of their particular medical situation and their constraints.  Brief life review performed.  Goals wishes and values attempted to be explored.  Patient's wife endorses continuation of full code/full scope status.  She states that prior to this past months hospitalization, the patient had a reasonable functional status and quality of life.  She states that she realizes the patient has serious issues with this hospitalization but that she is going to remain hopeful for ongoing stabilization/recovery.  Offered active listening and supportive care in this initial consultation encounter, see below  NEXT OF KIN  Wife.   SUMMARY OF RECOMMENDATIONS   Full code/full scope.  Continue current mode of care for now.   Code Status/Advance Care Planning: Full code   Symptom Management:    Palliative Prophylaxis:  Delirium Protocol  Additional Recommendations (Limitations, Scope, Preferences): Full Scope Treatment  Psycho-social/Spiritual:  Desire for further Chaplaincy support:yes Additional Recommendations: Caregiving  Support/Resources  Prognosis:  Unable  to determine  Discharge Planning: To Be Determined      Primary Diagnoses: Present on Admission:  Septic shock ([place])  Bladder cancer ([place])  Bacteremia due to Gram-negative bacteria  UTI due to Klebsiella species  Chronic pain   I have reviewed the medical record, interviewed the patient and family, and examined the patient. The following aspects are pertinent.  Past Medical History:  Diagnosis Date   Bladder cancer ([place])    Pt says small tumor not cancer   Bleeding gums  11/11/2015   COVID 09/23/2020   back ache hurt all over x few days all symptoms reolved   Dyslipidemia (high LDL; low HDL)    Low back pain 12/27/2014   Medial epicondylitis of right elbow 12/13/2014   Numbness and tingling of right leg    Tinnitus    all the time   Toenail fungus    2 big toes   Wears glasses    Social History   Socioeconomic History   Marital status: Married    Spouse name: Alleen Borne   Number of children: 3   Years of education: 14   Highest education level: Some college, no degree  Occupational History   Occupation: Retired- Ambulance person  Tobacco Use   Smoking status: Former    Packs/day: 1.50    Years: 30.00    Pack years: 45.00    Types: Cigarettes    Quit date: 04/02/1986    Years since quitting: 35.6   Smokeless tobacco: Never   Tobacco comments:    no plans to start  Vaping Use   Vaping Use: Never used  Substance and Sexual Activity   Alcohol use: Not Currently   Drug use: Never   Sexual activity: Yes    Partners: Female  Other Topics Concern   Not on file  Social History Narrative   Emergency Contact: wife, Selim Durden, [phone]   Who lives with you: Lives with 3rd wife and two sons   House is two level; has no trouble with the stairs. Smoke alarms present. No grab bars. No tripping hazards. Rugs are fastened down.   Any pets: poodle dog Location manager) and 10 parakeets   Diet: Patient has a varied diet and takes vitamin supplements. Uses exercise equipment at home (cycling, treadmill)    Exercise: Patient going to [facility] 7 days a week, bike, strength training   Seatbelts: Patient reports wearing seatbelt occasionally   Hobbies: Running, exercising, watching TV, parakeets   Education: some college.               Social Determinants of Health   Financial Resource Strain: Not on file  Food Insecurity: Not on file  Transportation Needs: Not on file  Physical Activity: Not on file  Stress: Not on file  Social  Connections: Not on file   Family History  Problem Relation Age of Onset   Stroke Mother    Heart disease Father    Stroke Father    Diabetes Father    Obesity Brother    Obesity Brother    Cancer Maternal Grandfather    Cancer Maternal Grandmother    Drug abuse Daughter    Scheduled Meds:  chlorhexidine  15 mL Mouth Rinse BID   Chlorhexidine Gluconate Cloth  6 each Topical Daily   enoxaparin (LOVENOX) injection  80 mg Subcutaneous Q12H   feeding supplement (PROSource TF)  45 mL Per Tube BID   fluticasone  2 spray Each Nare Daily  free water  30 mL Per Tube Q6H   insulin aspart  0-9 Units Subcutaneous Q6H   lip balm  1 application Topical BID   mouth rinse  15 mL Mouth Rinse q12n4p   multivitamin  15 mL Per Tube Daily   nystatin  5 mL Oral QID   nystatin   Topical BID   potassium chloride  40 mEq Per Tube Q4H   QUEtiapine  12.5 mg Per Tube QHS   saccharomyces boulardii  250 mg Per Tube BID   sodium chloride flush  10-40 mL Intracatheter Q12H   sodium chloride flush  10-40 mL Intracatheter Q12H   sodium chloride flush  5 mL Intracatheter Q8H   thiamine injection  100 mg Intravenous Daily   traZODone  50 mg Per Tube QHS   umeclidinium bromide  1 puff Inhalation Daily   Continuous Infusions:  sodium chloride Stopped (11/23/21 1558)   sodium chloride Stopped (11/21/21 1838)   anidulafungin Stopped (11/25/21 1154)   chlorproMAZINE (THORAZINE) IV Stopped (11/22/21 0414)   feeding supplement (OSMOLITE 1.5 CAL) 35 mL/hr at 11/25/21 1408   methocarbamol (ROBAXIN) IV Stopped (11/20/21 2252)   ondansetron (ZOFRAN) IV     PRN Meds:.sodium chloride, acetaminophen, ALPRAZolam, chlorproMAZINE (THORAZINE) IV, diphenhydrAMINE, docusate **AND** sennosides, guaiFENesin, hydrALAZINE, HYDROmorphone (DILAUDID) injection, magic mouthwash, menthol-cetylpyridinium, methocarbamol (ROBAXIN) IV, metoprolol tartrate, ondansetron (ZOFRAN) IV **OR** ondansetron (ZOFRAN) IV, phenol, sodium chloride  flush, sodium chloride flush Medications Prior to Admission:  Prior to Admission medications   Medication Sig Start Date End Date Taking? Authorizing Provider  acetaminophen (TYLENOL) 500 MG tablet Take 1,000 mg by mouth every 4 (four) hours as needed for mild pain or fever.   Yes [provider]  fluticasone (FLONASE) 50 MCG/ACT nasal spray Place 2 sprays into both nostrils daily. 01/05/20  Yes [name], MD  gabapentin (NEURONTIN) 300 MG capsule TAKE 3 CAPSULES BY MOUTH AT BEDTIME Patient taking differently: Take 900 mg by mouth at bedtime. 03/09/21  Yes Patel, [name], DO  ibuprofen (ADVIL) 200 MG tablet Take 600 mg by mouth every 4 (four) hours as needed for mild pain.   Yes [provider]  ondansetron (ZOFRAN-ODT) 8 MG disintegrating tablet Take 8 mg by mouth every 8 (eight) hours as needed for nausea/vomiting. 11/06/21  Yes [provider]  oxyCODONE-acetaminophen (PERCOCET) 5-325 MG tablet Take 1 tablet by mouth every 6 (six) hours as needed for severe pain. 11/02/21 11/02/22 Yes [name], MD  SPIRIVA HANDIHALER 18 MCG inhalation capsule PLACE 1 CAPSULE( 18 MCG TOTAL) INTO INHALER AND INHALE DAILY Patient taking differently: Place 18 mcg into inhaler and inhale daily. 08/28/21  Yes [name], MD  tiZANidine (ZANAFLEX) 4 MG tablet Take 4 mg by mouth at bedtime.   Yes [provider]   Allergies  Allergen Reactions   Hydrocodone Other (See Comments)    Causes constipation even with stool softner   Review of Systems + weakness Physical Exam Has NGT Awake alert In mild distress Regular Bruising on lip Appears with generalized weakness.   Vital Signs: BP (!) 137/98   Pulse 96   Temp 98.2 F (36.8 C) (Axillary)   Resp (!) 27   Ht 5\' 7"  (1.702 m)   Wt 79.7 kg   SpO2 97%   BMI 27.52 kg/m  Pain Scale: 0-10 POSS *See Group Information*: 1-Acceptable,Awake and alert Pain Score: Asleep   SpO2: SpO2: 97 % O2  Device:SpO2: 97 % O2 Flow Rate: .O2 Flow Rate (L/min):  4 L/min  IO: Intake/output summary:  Intake/Output Summary (Last 24 hours) at 11/25/2021 1452 Last data filed at 11/25/2021 1100 Gross per 24 hour  Intake 566.23 ml  Output 3300 ml  Net -2733.77 ml    LBM: Last BM Date: 11/25/21 Baseline Weight: Weight: 79.4 kg Most recent weight: Weight: 79.7 kg     Palliative Assessment/Data:   PPS 40%  Time In: 1300 Time Out: 1400 Time Total: 60 min.   Greater than 50%  of this time was spent counseling and coordinating care related to the above assessment and plan.  Signed by: Loistine Chance, MD   Please contact Palliative Medicine Team phone at [phone] for questions and concerns.  For individual provider: See Shea Evans

## 2021-11-25 NOTE — Progress Notes (Signed)
PROGRESS NOTE    William [name]  BZJ:[id number] DOB: 12/10/1941 DOA: 11/03/2021 PCP: [name], MD   Brief Narrative:  80 year old with history of bladder cancer status post robotic assisted laparoscopic radical cystectomy/prostatectomy on 10/25/2021 by [name], bilateral pelvic lymph node dissection, extensive laparoscopic DCO lysis, small bowel resection with ileal conduit, urinary diversion on 10/26/2021, COPD, tobacco use initially discharged on 11/02/2021 but returned back to the hospital 11/17/2021 due to hypotension.  Patient was found to be in septic shock secondary to urinary tract infection from multiple species requiring vasopressors and was admitted to the ICU.  Eventually patient was transferred to ICU on 11/11/2021.  Hospital course was complicated by ileus/partial small bowel obstruction therefore general surgery was consulted.  This was conservatively managed.  Initially in renal function worsened but thereafter it slowly improved.  Repeat CT scan showed large intra-abdominal abscess 25.5 centimeters the cultures grew Candida albicans.  Patient was started on IV Diflucan, ID was Consulted and was switched to Eraxis.  IR drained intra-abdominal abscess on 11/19.  Also received PRBC transfusion during hospitalization.  Also found to have right upper extremity DVT and SVT therefore started on treatment with Lovenox.  Cortrak placed 11/25 to initiate tube feeding.   Assessment & Plan:   Principal Problem:   Septic shock ([place]) Active Problems:   Chronic pain   Bladder cancer ([place])   Bacteremia due to Gram-negative bacteria   UTI due to Klebsiella species   Acute kidney injury ([place])   Intra-abdominal abscess ([name])   S/P PICC central line placement   Fungemia   Swelling of joint of upper arm, right   Shock ([place])   Small bowel obstruction ([name])   Left arm swelling  Septic shock, sepsis criteria is improving, no longer on vasopressor, secondary to Klebsiella  oxytoca/Enterococcus faecalis UTI/Klebsiella oxytoca bacteremia, newly diagnosed Candida albicans fungemia Intra-abdominal abscess - Shock physiology-resolved. ID following.  Zosyn stopped 11/25 - Previous blood cultures 11/10-grew Klebsiella, urine cultures grew Enterococcus faecalis/Klebsiella - Blood cultures 11/18 grew Candida albicans.  Eraxis maybe Switched to fluconazole per ID -Benign eye exam, completed by ophthalmology [name] Close on 11/19/2021.  No evidence of fungal endophthalmitis.  Right upper extremity DVT and SVT Left upper extremity SVT - Lovenox 1 mg/kg started.  Initially patient was on right upper extremity now in the left upper extremity.  Ideally would like to to be removed but there is no order IV access.  Severe protein calorie malnutrition Hypoalbuminemia Hypernatremia due to clinical dehydration - Minimal oral intake.  Cortrak placed 11/25, tube feedings.  Continues to third spaced fluids.  Prealbumin <5  Mild acute respiratory distress with pulmonary edema Bilateral pleural effusion -Received IV Lasix.  Have to be very cautious in giving IV fluids  Intra-abdominal abscess seen on CT scan measuring 25.5 cm  -Status post image guided drain placement left lower quadrant by interventional radiology [name] on 11/18/2021.  IR to follow. - Currently on IV Eraxis.  Zosyn stopped 11/23.  Small bowel obstruction, now ileus/partial small bowel obstruction. -Initially noted on 11/10 on CT abdomen/pelvis, NG tube placed which was removed on 11/15.  Cortrak placed again on 11/25 due to very poor oral intake.    Acute blood loss anemia -Baseline hemoglobin 13.  Required multiple PRBC transfusion 1U 11/19; 2U 11/22.  No ongoing signs of blood loss   Intermittent delirium -Secondary to underlying illness.  Getting bedtime Seroquel and trazodone.  Delirium protocol    Acute hypoxic respiratory failure secondary  to bilateral pleural effusions Acute diastolic congestive  heart failure with bilateral pleural effusions -As needed oxygen as needed, I-S/flutter valve.  Bronchodilators.  Out of bed to chair    Sinus tachycardia/A fib rvr suspect related to sepsis and ongoing lung physiology -Slightly better but this is secondary to underlying pulmonary condition, generalized deconditioning and dehydration.  IV Lopressor as needed   Nonoliguric acute kidney injury; resolved.  -Baseline creatinine around 0.8.  Slowly trended up and peaked at 2.58 > 1.84> 1.41 >0.8> 0.83. nephro following.    Improving severe left flank/back pain secondary to intra-abdominal large abscess seen on CT scan. -As needed pain control  Oral thrush -Already on Eraxis, Magic mouthwash with lidocaine   Refractory hypokalemia post repletion -As needed repletion of electrolytes   Bladder cancer s/p radical cystectomy, prostatectomy and ileal conduit by urology [name] on 10/25/2021, and 10/26/2021. Underwent robotic assisted laparoscopic radical cystectomy, robotic radical prostatectomy, bilateral pelvic lymph node dissection, extensive laparoscopic adhesiolysis, small bowel resection and ileal conduit urinary diversion on 10/25/2021 and on 10/26/2021 by [name]. -Urology team following   COPD History of tobacco abuse -Continue Incruse, as needed bronchodilators, I-S/flutter.   Suspected ILD Noted on CT imaging this admission.  History of tobacco abuse. --Flonase --Outpatient pulmonology follow-up   Weakness/deconditioning/debility: PT/OT- HH  Overall patient is quite ill and remains full code.  Due to multiple ongoing medical issues and frailty, will consult palliative care service to help establish goals of care and discuss long-term feeding options especially if his oral intake remains very poor.    DVT prophylaxis: Subcutaneous heparin Code Status: Full code Family Communication:   Status is: Inpatient  Remains inpatient appropriate because: Multiple ongoing issues  as mentioned above, not ready for discharge.  Patient will need to be cleared by multiple services including ID, nephrology and urology prior to discharge  Nutritional status  Nutrition Problem: Inadequate oral intake Etiology: decreased appetite  Signs/Symptoms: per patient/family report  Interventions: Tube feeding, Prostat  Body mass index is 27.52 kg/m.     Subjective: Patient slightly confused this morning.  Try to get out of bed.  Tells me he wants to go outside, attempting to pull on his IV lines and Cortrak  Review of Systems Otherwise negative except as per HPI, including: General: Denies fever, chills, night sweats or unintended weight loss. Resp: Denies cough, wheezing, shortness of breath. Cardiac: Denies chest pain, palpitations, orthopnea, paroxysmal nocturnal dyspnea. GI: Denies abdominal pain, nausea, vomiting, diarrhea or constipation GU: Denies dysuria, frequency, hesitancy or incontinence MS: Denies muscle aches, joint pain or swelling Neuro: Denies headache, neurologic deficits (focal weakness, numbness, tingling), abnormal gait Psych: Denies anxiety, depression, SI/HI/AVH Skin: Denies new rashes or lesions ID: Denies sick contacts, exotic exposures, travel  Examination: Constitutional: Appears chronically ill, cortrak currently in place. Respiratory: Clear to auscultation bilaterally Cardiovascular: Irregularly irregular rhythm. Abdomen: Nontender nondistended good bowel sounds Musculoskeletal: No edema noted Skin: No rashes seen Neurologic: CN 2-12 grossly intact.  And nonfocal Psychiatric: Poor judgment and insight.  Alert to name only. PICC line in place; LUE Drain in place Foley catheter in place  Objective: Vitals:   11/25/21 0400 11/25/21 0433 11/25/21 0500 11/25/21 0600  BP: 131/79   (!) 152/99  Pulse: (!) 109  96 91  Resp: (!) 21  (!) 22 18  Temp:      TempSrc:      SpO2: 95%  96% 96%  Weight:  79.7 kg    Height:  Intake/Output Summary (Last 24 hours) at 11/25/2021 0805 Last data filed at 11/25/2021 0647 Gross per 24 hour  Intake 584.56 ml  Output 6300 ml  Net -5715.44 ml   Filed Weights   11/23/21 0500 11/24/21 0500 11/25/21 0433  Weight: 81.9 kg 82.8 kg 79.7 kg     Data Reviewed:   CBC: Recent Labs  Lab 11/21/21 0515 11/21/21 1955 11/22/21 0500 11/23/21 0400 11/24/21 0549  WBC 8.4 9.3 9.2 8.3 10.2  NEUTROABS 6.4  --  7.3 6.3 7.8*  HGB 7.7* 9.7* 9.7* 9.0* 9.3*  HCT 24.4* 30.0* 29.7* 27.7* 29.4*  MCV 92.1 90.6 90.8 91.1 92.2  PLT 315 315 308 315 267   Basic Metabolic Panel: Recent Labs  Lab 11/20/21 0540 11/21/21 0515 11/22/21 0500 11/23/21 0400 11/24/21 0549 11/24/21 1606 11/25/21 0427  NA 135 136  136 137 141 146* 149* 151*  K 3.0* 3.3*  3.2* 3.2* 3.2* 2.8* 3.1* 2.6*  CL 104 107  106 109 118* 116* 117* 116*  CO2 21* 22  22 21* 20* 23 22 26   GLUCOSE 85 89  89 92 89 106* 107* 102*  BUN 24* 26*  27* 23 17 10 10 12   CREATININE 1.67* 2.45*  2.48* 1.84* 1.41* 0.88 0.82 0.83  CALCIUM 7.3* 7.2*  7.1* 7.4* 7.7* 8.2* 8.6* 8.6*  MG 1.8 1.9  --  2.2 1.9 1.8 1.9  PHOS 2.9 3.5  --  2.7  --  2.2* 2.6   GFR: Estimated Creatinine Clearance: 73 mL/min (by C-G formula based on SCr of 0.83 mg/dL). Liver Function Tests: Recent Labs  Lab 11/19/21 0358 11/20/21 0540 11/21/21 0515 11/22/21 0500 11/23/21 0400  AST 26 26 27 26 19   ALT 15 16 16 15 12   ALKPHOS 74 78 67 83 71  BILITOT 0.7 0.7 0.7 1.0 1.0  PROT 4.8* 5.7* 5.2* 5.5* 5.8*  ALBUMIN 1.7* 1.6* 1.5*  <1.5* <1.5* 2.3*   No results for input(s): LIPASE, AMYLASE in the last 168 hours.  No results for input(s): AMMONIA in the last 168 hours. Coagulation Profile: Recent Labs  Lab 11/18/21 1928  INR 1.3*   Cardiac Enzymes: No results for input(s): CKTOTAL, CKMB, CKMBINDEX, TROPONINI in the last 168 hours. BNP (last 3 results) No results for input(s): PROBNP in the last 8760 hours. HbA1C: No results for  input(s): HGBA1C in the last 72 hours. CBG: Recent Labs  Lab 11/24/21 1139 11/24/21 1755 11/24/21 2329 11/25/21 0339 11/25/21 0520  GLUCAP 114* 122* 95 100* 87   Lipid Profile: No results for input(s): CHOL, HDL, LDLCALC, TRIG, CHOLHDL, LDLDIRECT in the last 72 hours.  Thyroid Function Tests: No results for input(s): TSH, T4TOTAL, FREET4, T3FREE, THYROIDAB in the last 72 hours.  Anemia Panel: No results for input(s): VITAMINB12, FOLATE, FERRITIN, TIBC, IRON, RETICCTPCT in the last 72 hours. Sepsis Labs: No results for input(s): PROCALCITON, LATICACIDVEN in the last 168 hours.   Recent Results (from the past 240 hour(s))  Culture, blood (routine x 2)     Status: Abnormal   Collection Time: 11/17/21  5:10 AM   Specimen: BLOOD  Result Value Ref Range Status   Specimen Description   Final    BLOOD BLOOD LEFT HAND Performed at [facility] [address], [place], [place] [address]    Special Requests   Final    BOTTLES DRAWN AEROBIC ONLY Blood Culture adequate volume Performed at [facility] [address], [place], [place] [address]    Culture  Setup Time   Final    YEAST AEROBIC BOTTLE ONLY CRITICAL VALUE NOTED.  VALUE IS CONSISTENT WITH PREVIOUSLY REPORTED AND CALLED VALUE.    Culture (A)  Final    CANDIDA ALBICANS SUSCEPTIBILITIES PERFORMED ON PREVIOUS CULTURE WITHIN THE LAST 5 DAYS. Performed at [facility], [place] [address] [place], [place] [address]    Report Status 11/23/2021 FINAL  Final  Culture, blood (routine x 2)     Status: Abnormal   Collection Time: 11/17/21  5:20 AM   Specimen: BLOOD  Result Value Ref Range Status   Specimen Description   Final    BLOOD BLOOD LEFT WRIST Performed at [facility] [address] [place], [place] [address]    Special Requests   Final    BOTTLES DRAWN AEROBIC ONLY Blood Culture adequate volume Performed at [facility]  [address] [place], [place] [address]    Culture  Setup Time   Final    YEAST AEROBIC BOTTLE ONLY CRITICAL RESULT CALLED TO, READ BACK BY AND VERIFIED WITH: A PHAM PHARMD @0821  11/18/21 EB    Culture (A)  Final    CANDIDA ALBICANS SEE SEPARATE REPORT Performed at [facility], [address], [place], [place] [address]    Report Status 11/23/2021 FINAL  Final  Blood Culture ID Panel (Reflexed)     Status: Abnormal   Collection Time: 11/17/21  5:20 AM  Result Value Ref Range Status   Enterococcus faecalis NOT DETECTED NOT DETECTED Final   Enterococcus Faecium NOT DETECTED NOT DETECTED Final   Listeria monocytogenes NOT DETECTED NOT DETECTED Final   Staphylococcus species NOT DETECTED NOT DETECTED Final   Staphylococcus aureus (BCID) NOT DETECTED NOT DETECTED Final   Staphylococcus epidermidis NOT DETECTED NOT DETECTED Final   Staphylococcus lugdunensis NOT DETECTED NOT DETECTED Final   Streptococcus species NOT DETECTED NOT DETECTED Final   Streptococcus agalactiae NOT DETECTED NOT DETECTED Final   Streptococcus pneumoniae NOT DETECTED NOT DETECTED Final   Streptococcus pyogenes NOT DETECTED NOT DETECTED Final   A.calcoaceticus-baumannii NOT DETECTED NOT DETECTED Final   Bacteroides fragilis NOT DETECTED NOT DETECTED Final   Enterobacterales NOT DETECTED NOT DETECTED Final   Enterobacter cloacae complex NOT DETECTED NOT DETECTED Final   Escherichia coli NOT DETECTED NOT DETECTED Final   Klebsiella aerogenes NOT DETECTED NOT DETECTED Final   Klebsiella oxytoca NOT DETECTED NOT DETECTED Final   Klebsiella pneumoniae NOT DETECTED NOT DETECTED Final   Proteus species NOT DETECTED NOT DETECTED Final   Salmonella species NOT DETECTED NOT DETECTED Final   Serratia marcescens NOT DETECTED NOT DETECTED Final   Haemophilus influenzae NOT DETECTED NOT DETECTED Final   Neisseria meningitidis NOT DETECTED NOT DETECTED Final   Pseudomonas aeruginosa NOT DETECTED NOT DETECTED Final    Stenotrophomonas maltophilia NOT DETECTED NOT DETECTED Final   Candida albicans DETECTED (A) NOT DETECTED Final    Comment: CRITICAL RESULT CALLED TO, READ BACK BY AND VERIFIED WITH: A PHAM PHARMD @0821  11/18/21 EB    Candida auris NOT DETECTED NOT DETECTED Final   Candida glabrata NOT DETECTED NOT DETECTED Final   Candida krusei NOT DETECTED NOT DETECTED Final   Candida parapsilosis NOT DETECTED NOT DETECTED Final   Candida tropicalis NOT DETECTED NOT DETECTED Final   Cryptococcus neoformans/gattii NOT DETECTED NOT DETECTED Final    Comment: Performed at [facility], [address], [place], [place] [address]  Antifungal AST 9 Drug Panel  Status: None   Collection Time: 11/17/21  5:20 AM  Result Value Ref Range Status   Organism ID, Yeast Candida albicans  Corrected    Comment: (NOTE) Identification performed by account, not confirmed by this laboratory. CORRECTED ON 11/24 AT 1336: PREVIOUSLY REPORTED AS Preliminary report    Amphotericin B MIC 0.5 ug/mL  Final    Comment: (NOTE) Breakpoints have been established for only some organism-drug combinations as indicated. This test was developed and its performance characteristics determined by [facility]. It has not been cleared or approved by the Food and Drug Administration.    Anidulafungin MIC Comment  Final    Comment: (NOTE) 0.016 ug/mL Susceptible Breakpoints have been established for only some organism-drug combinations as indicated. This test was developed and its performance characteristics determined by [facility]. It has not been cleared or approved by the Food and Drug Administration.    Caspofungin MIC Comment  Final    Comment: (NOTE) 0.016 ug/mL Susceptible Breakpoints have been established for only some organism-drug combinations as indicated. This test was developed and its performance characteristics determined by [facility]. It has not been cleared or approved by the Food and Drug Administration.     Micafungin MIC Comment  Final    Comment: (NOTE) 0.016 ug/mL Susceptible Breakpoints have been established for only some organism-drug combinations as indicated. This test was developed and its performance characteristics determined by [facility]. It has not been cleared or approved by the Food and Drug Administration.    Posaconazole MIC 0.03 ug/mL  Final    Comment: (NOTE) Breakpoints have been established for only some organism-drug combinations as indicated. This test was developed and its performance characteristics determined by [facility]. It has not been cleared or approved by the Food and Drug Administration.    Fluconazole Islt MIC 0.5 ug/mL Susceptible  Final    Comment: (NOTE) Breakpoints have been established for only some organism-drug combinations as indicated. This test was developed and its performance characteristics determined by [facility]. It has not been cleared or approved by the Food and Drug Administration.    Flucytosine MIC 1.0 ug/mL  Final    Comment: (NOTE) Breakpoints have been established for only some organism-drug combinations as indicated. This test was developed and its performance characteristics determined by [facility]. It has not been cleared or approved by the Food and Drug Administration.    Itraconazole MIC 0.12 ug/mL  Final    Comment: (NOTE) Breakpoints have been established for only some organism-drug combinations as indicated. This test was developed and its performance characteristics determined by [facility]. It has not been cleared or approved by the Food and Drug Administration.    Voriconazole MIC Comment  Final    Comment: (NOTE) 0.016 ug/mL Susceptible Breakpoints have been established for only some organism-drug combinations as indicated. This test was developed and its performance characteristics determined by [facility]. It has not been cleared or approved by the Food and Drug Administration. Performed At: [facility] [place], [place] [id number] Rush Farmer MD [id number]    Source [phone] SORUCE BLD CBenjamine Sprague SENSI  Final    Comment: Performed at [facility], [place] [address] [place], [place] [address]  MRSA Next Gen by PCR, Nasal     Status: None   Collection Time: 11/17/21  6:24 PM   Specimen: Nasal Mucosa; Nasal Swab  Result Value Ref Range Status   MRSA by PCR Next Gen NOT DETECTED NOT DETECTED Final    Comment: (NOTE) The GeneXpert  MRSA Assay (FDA approved for NASAL specimens only), is one component of a comprehensive MRSA colonization surveillance program. It is not intended to diagnose MRSA infection nor to guide or monitor treatment for MRSA infections. Test performance is not FDA approved in patients less than 25 years old. Performed at [facility], [place] [address] [place], [place] [address]   C Difficile Quick Screen (NO PCR Reflex)     Status: None   Collection Time: 11/17/21  8:40 PM   Specimen: STOOL  Result Value Ref Range Status   C Diff antigen NEGATIVE NEGATIVE Final   C Diff toxin NEGATIVE NEGATIVE Final   C Diff interpretation NEGATIVE  Final    Comment: Performed at [facility], [place] [address] [place], [place] [address]  Aerobic/Anaerobic Culture w Gram Stain (surgical/deep wound)     Status: None   Collection Time: 11/18/21 11:01 AM   Specimen: Abdomen; Abscess  Result Value Ref Range Status   Specimen Description   Final    ABDOMEN Performed at [facility] [address] [place], [place] [address]    Special Requests   Final    ABSCES Performed at [facility], [place] [address] [place], [place] [address]    Gram Stain   Final    MODERATE WBC PRESENT,BOTH PMN AND MONONUCLEAR NO ORGANISMS SEEN    Culture   Final    RARE ESCHERICHIA COLI RARE CANDIDA ALBICANS NO ANAEROBES ISOLATED Performed at [facility], [address] [place],  [place] [address]    Report Status 11/23/2021 FINAL  Final   Organism ID, Bacteria ESCHERICHIA COLI  Final      Susceptibility   Escherichia coli - MIC*    AMPICILLIN <=2 SENSITIVE Sensitive     CEFAZOLIN <=4 SENSITIVE Sensitive     CEFEPIME <=0.12 SENSITIVE Sensitive     CEFTAZIDIME <=1 SENSITIVE Sensitive     CEFTRIAXONE <=0.25 SENSITIVE Sensitive     CIPROFLOXACIN <=0.25 SENSITIVE Sensitive     GENTAMICIN <=1 SENSITIVE Sensitive     IMIPENEM <=0.25 SENSITIVE Sensitive     TRIMETH/SULFA <=20 SENSITIVE Sensitive     AMPICILLIN/SULBACTAM <=2 SENSITIVE Sensitive     PIP/TAZO <=4 SENSITIVE Sensitive     * RARE ESCHERICHIA COLI  Fungus Stain     Status: None   Collection Time: 11/18/21 11:01 AM   Specimen: Abscess  Result Value Ref Range Status   FUNGUS STAIN Final report  Final    Comment: (NOTE) Performed At: [facility] [address] [place], [place] [id number] Rush Farmer MD [id number]    Fungal Source ABSCESS  Final    Comment: Performed at [facility], [place] [address] [place], [place] [address]  Fungal Stain reflex     Status: None   Collection Time: 11/18/21 11:01 AM  Result Value Ref Range Status   Fungal stain result 1 Comment  Final    Comment: (NOTE) KOH/Calcofluor preparation:  no fungus observed. Performed At: [facility] [place], [place] [id number] Rush Farmer MD PZ:[id number]   Culture, fungus without smear     Status: Abnormal (Preliminary result)   Collection Time: 11/18/21 11:02 AM   Specimen: Abscess; Other  Result Value Ref Range Status   Specimen Description   Final    ABSCESS Performed at [facility] [address] [place], [place] [address]    Special Requests ABDOMEN  Final   Culture (A)  Final    CANDIDA ALBICANS CONTINUING TO HOLD Performed at [facility], [place] [address] [place], [place] [address]    Report Status PENDING  Incomplete  Urine Culture     Status: Abnormal    Collection Time: 11/18/21  3:03 PM   Specimen: Urine, Clean Catch  Result Value Ref Range Status   Specimen Description   Final    URINE, CLEAN CATCH Performed at [facility], [place] [address] [place], [place] [address]    Special Requests   Final    NONE Performed at [facility], [place] [address] [place], [place], [place] [address]    Culture (A)  Final    <10,000 COLONIES/mL INSIGNIFICANT GROWTH Performed at [facility] [address] [place], [place] [address]    Report Status 11/20/2021 FINAL  Final  Culture, blood (routine x 2)     Status: Abnormal (Preliminary result)   Collection Time: 11/18/21  7:27 PM   Specimen: BLOOD  Result Value Ref Range Status   Specimen Description   Final    BLOOD BLOOD LEFT WRIST Performed at [facility] [address], [place], [place] [address]    Special Requests   Final    BOTTLES DRAWN AEROBIC ONLY Blood Culture adequate volume Performed at [facility] [address] [place], [place] [address]    Culture  Setup Time   Final    YEAST AEROBIC BOTTLE ONLY CRITICAL VALUE NOTED.  VALUE IS CONSISTENT WITH PREVIOUSLY REPORTED AND CALLED VALUE.    Culture (A)  Final    CANDIDA ALBICANS CRITICAL VALUE NOTED.  VALUE IS CONSISTENT WITH PREVIOUSLY REPORTED AND CALLED VALUE. Performed at [facility], [place] [address] [place], [place] [address]    Report Status PENDING  Incomplete  Fungus culture, blood     Status: None (Preliminary result)   Collection Time: 11/18/21  7:28 PM   Specimen: BLOOD  Result Value Ref Range Status   Specimen Description   Final    BLOOD BLOOD LEFT HAND Performed at [facility] [address] [place], [place] [address]    Special Requests   Final    BOTTLES DRAWN AEROBIC ONLY Blood Culture adequate volume Performed at [facility] [address] [place] [address], [place], [place] [address]    Fungal  Smear   Final    YEAST AEROBIC BOTTLE ONLY CRITICAL VALUE NOTED.  VALUE IS CONSISTENT WITH PREVIOUSLY REPORTED AND CALLED VALUE.    Culture   Final    NO GROWTH 5 DAYS Performed at [facility], [place] [address], [place], [place] [address]    Report Status PENDING  Incomplete  Culture, blood (routine x 2)     Status: None   Collection Time: 11/18/21  7:28 PM   Specimen: BLOOD  Result Value Ref Range Status   Specimen Description   Final    BLOOD BLOOD LEFT HAND Performed at [facility] [address], [place], [place] [address]    Special Requests   Final    BOTTLES DRAWN AEROBIC ONLY Blood Culture adequate volume Performed at [facility] [address] [place], [place] [address]    Culture   Final    NO GROWTH 5 DAYS Performed at [facility], [place] [address] [place],  [address]    Report Status 11/24/2021 FINAL  Final  Radiology Studies: DG Abd 1 View  Result Date: 11/24/2021 CLINICAL DATA:  NG placement. EXAM: ABDOMEN - 1 VIEW COMPARISON:  Abdominal radiograph dated 11/24/2021. FINDINGS: Enteric tube with weighted tip to the right of the spine likely in the distal stomach or proximal duodenum. Partially visualized bilateral ureteral stents. Diffuse interstitial coarsening and streaky and nodular densities. Left-sided PICC with tip at the cavoatrial junction. IMPRESSION: Enteric tube with tip in the distal stomach or proximal duodenum. Electronically Signed   By: Anner Crete M.D.   On: 11/24/2021 19:26   DG Abd 1 View  Result Date: 11/24/2021 CLINICAL DATA:  80 year old male status post feeding tube placement. EXAM: ABDOMEN - 1 VIEW COMPARISON:  11/16/2021 FINDINGS: Weighted tip enteric feeding tube distal tip is at the level of the pylorus/first portion of the duodenum. Nonspecific bowel gas pattern. Similar appearance of indwelling bilateral nephroureteral stents. Partially visualized lumbar spinal  fusion hardware. IMPRESSION: Enteric feeding tube weighted tip is located at the level of the pylorus/first portion of the duodenum. Electronically Signed   By: Ruthann Cancer M.D.   On: 11/24/2021 13:35   DG Chest Port 1 View  Result Date: 11/23/2021 CLINICAL DATA:  PICC line placement. EXAM: PORTABLE CHEST 1 VIEW COMPARISON:  11/17/2021 studies FINDINGS: A LEFT-sided PICC line is noted with tip overlying the mid-LOWER SVC. A RIGHT PICC line is again identified with tip in the region of the SUPERIOR cavoatrial junction. New diffuse bilateral airspace opacities are noted. No pneumothorax or large pleural effusion identified. No acute bony abnormalities are present. IMPRESSION: 1. LEFT PICC line placement with tip overlying the mid-LOWER SVC 2. New diffuse bilateral airspace opacities, question edema or infection. Electronically Signed   By: Margarette Canada M.D.   On: 11/23/2021 11:49   VAS [place] UPPER EXTREMITY VENOUS DUPLEX  Result Date: 11/24/2021 UPPER VENOUS STUDY  Patient Name:  JERIME ARIF  Date of Exam:   11/24/2021 Medical Rec #: [id number]       Accession #:    [id number] Date of Birth: 1941/05/16       Patient Gender: M Patient Age:   44 years Exam Location:  [facility] Procedure:      VAS [place] UPPER EXTREMITY VENOUS DUPLEX Referring Phys: CORNELIUS VAN DAM --------------------------------------------------------------------------------  Indications: Swelling Risk Factors: PICC line placement. Comparison Study: Previous BUEV on 11/14/21 was negative for DVT. Performing Technologist: Charmayne Sheer  Examination Guidelines: A complete evaluation includes B-mode imaging, spectral Doppler, color Doppler, and power Doppler as needed of all accessible portions of each vessel. Bilateral testing is considered an integral part of a complete examination. Limited examinations for reoccurring indications may be performed as noted.  Right Findings:  +----------+------------+---------+-----------+----------+-------+ RIGHT     CompressiblePhasicitySpontaneousPropertiesSummary +----------+------------+---------+-----------+----------+-------+ Subclavian    Full       Yes       Yes                      +----------+------------+---------+-----------+----------+-------+  Left Findings: +----------+------------+---------+-----------+----------+-------+ LEFT      CompressiblePhasicitySpontaneousPropertiesSummary +----------+------------+---------+-----------+----------+-------+ IJV           Full       Yes       Yes                      +----------+------------+---------+-----------+----------+-------+ Subclavian    Full       Yes       Yes                      +----------+------------+---------+-----------+----------+-------+  Axillary      Full       Yes       Yes                      +----------+------------+---------+-----------+----------+-------+ Brachial      Full       Yes       Yes                      +----------+------------+---------+-----------+----------+-------+ Radial        Full                                          +----------+------------+---------+-----------+----------+-------+ Ulnar         Full                                          +----------+------------+---------+-----------+----------+-------+ Cephalic      None       No        No                Acute  +----------+------------+---------+-----------+----------+-------+ Basilic       Full       Yes       Yes                      +----------+------------+---------+-----------+----------+-------+ Visualization of the brachial and basilic veins were limited due to PICC line placement.  Summary:  Right: No evidence of thrombosis in the subclavian.  Left: No evidence of deep vein thrombosis in the upper extremity. Findings consistent with acute superficial vein thrombosis involving the left cephalic vein.  *See  table(s) above for measurements and observations.    Preliminary    VAS [place] UPPER EXTREMITY VENOUS DUPLEX  Result Date: 11/24/2021 UPPER VENOUS STUDY  Patient Name:  ANTHONEE GELIN  Date of Exam:   11/23/2021 Medical Rec #: [id number]       Accession #:    [id number] Date of Birth: 1941/01/03       Patient Gender: M Patient Age:   40 years Exam Location:  [facility] Procedure:      VAS [place] UPPER EXTREMITY VENOUS DUPLEX Referring Phys: CORNELIUS VAN DAM --------------------------------------------------------------------------------  Indications: RUE swelling Risk Factors: PICC line placement. Limitations: Line and bandages. Comparison Study: Previous exam of BUE on 11/14/2021 was negative for DVT. Performing Technologist: Rogelia Rohrer RVT, RDMS  Examination Guidelines: A complete evaluation includes B-mode imaging, spectral Doppler, color Doppler, and power Doppler as needed of all accessible portions of each vessel. Bilateral testing is considered an integral part of a complete examination. Limited examinations for reoccurring indications may be performed as noted.  Right Findings: +----------+------------+---------+-----------+----------+---------------------+ RIGHT     CompressiblePhasicitySpontaneousProperties       Summary        +----------+------------+---------+-----------+----------+---------------------+ IJV           Full       Yes       Yes                                    +----------+------------+---------+-----------+----------+---------------------+ Subclavian    Full       Yes  Yes                                    +----------+------------+---------+-----------+----------+---------------------+ Axillary      Full       Yes       Yes                                    +----------+------------+---------+-----------+----------+---------------------+ Brachial    Partial      No        No                  Acute in one of                                                             paired veins      +----------+------------+---------+-----------+----------+---------------------+ Radial        Full                                                        +----------+------------+---------+-----------+----------+---------------------+ Ulnar         Full                                                        +----------+------------+---------+-----------+----------+---------------------+ Cephalic      Full                                                        +----------+------------+---------+-----------+----------+---------------------+ Basilic       None       No        No                       Acute         +----------+------------+---------+-----------+----------+---------------------+ Difficulty imaging brachial and basilic veins due to PICC line placement.  Left Findings: +----------+------------+---------+-----------+----------+-------+ LEFT      CompressiblePhasicitySpontaneousPropertiesSummary +----------+------------+---------+-----------+----------+-------+ Subclavian    Full       Yes       Yes                      +----------+------------+---------+-----------+----------+-------+  Summary:  Right: Findings consistent with acute deep vein thrombosis involving the right brachial veins. Findings consistent with acute superficial vein thrombosis involving the right basilic vein.  Left: No evidence of thrombosis in the subclavian.  *See table(s) above for measurements and observations.  Diagnosing physician: Orlie Pollen Electronically signed by Orlie Pollen on 11/24/2021 at 10:48:00 AM.    Final         Scheduled Meds:  chlorhexidine  15 mL Mouth Rinse BID   Chlorhexidine  Gluconate Cloth  6 each Topical Daily   enoxaparin (LOVENOX) injection  80 mg Subcutaneous Q12H   feeding supplement (PROSource TF)  45 mL Per Tube BID   fluticasone  2 spray Each Nare Daily   free water  30 mL Per Tube Q6H   insulin  aspart  0-9 Units Subcutaneous Q6H   lip balm  1 application Topical BID   mouth rinse  15 mL Mouth Rinse q12n4p   multivitamin  15 mL Per Tube Daily   nystatin  5 mL Oral QID   nystatin   Topical BID   potassium chloride  40 mEq Per Tube Q4H   QUEtiapine  12.5 mg Per Tube QHS   saccharomyces boulardii  250 mg Per Tube BID   sodium chloride flush  10-40 mL Intracatheter Q12H   sodium chloride flush  10-40 mL Intracatheter Q12H   sodium chloride flush  5 mL Intracatheter Q8H   thiamine injection  100 mg Intravenous Daily   traZODone  50 mg Per Tube QHS   umeclidinium bromide  1 puff Inhalation Daily   Continuous Infusions:  sodium chloride Stopped (11/23/21 1558)   sodium chloride Stopped (11/21/21 1838)   anidulafungin Stopped (11/24/21 1153)   chlorproMAZINE (THORAZINE) IV Stopped (11/22/21 0414)   feeding supplement (OSMOLITE 1.5 CAL) 1,000 mL (11/25/21 0758)   methocarbamol (ROBAXIN) IV Stopped (11/20/21 2252)   ondansetron (ZOFRAN) IV     potassium chloride 10 mEq (11/25/21 0749)     LOS: 16 days   Time spent= 35 mins    Pat Elicker Arsenio Loader, MD [facility]  If 7PM-7AM, please contact night-coverage  11/25/2021, 8:05 AM

## 2021-11-25 NOTE — Progress Notes (Signed)
Referring Physician(s): Hall,C/Wakefield,M  Supervising Physician: Dr. Pascal Lux  Patient Status:  [facility] - In-pt  Chief Complaint: Lower abdominal fluid collection   Subjective: Pt okay. left abdominal drain OP about 175 cc yellow fluid on flow sheet Fluid Creat level still pending   Allergies: Hydrocodone  Medications: Current Outpatient Medications  Medication Instructions   acetaminophen (TYLENOL) 1,000 mg, Oral, Every 4 hours PRN   fluticasone (FLONASE) 50 MCG/ACT nasal spray 2 sprays, Each Nare, Daily   gabapentin (NEURONTIN) 300 MG capsule TAKE 3 CAPSULES BY MOUTH AT BEDTIME   ibuprofen (ADVIL) 600 mg, Oral, Every 4 hours PRN   ondansetron (ZOFRAN-ODT) 8 mg, Oral, Every 8 hours PRN   oxyCODONE-acetaminophen (PERCOCET) 5-325 MG tablet 1 tablet, Oral, Every 6 hours PRN   SPIRIVA HANDIHALER 18 MCG inhalation capsule PLACE 1 CAPSULE( 18 MCG TOTAL) INTO INHALER AND INHALE DAILY   tiZANidine (ZANAFLEX) 4 mg, Oral, Daily at bedtime      Vital Signs: BP 138/85   Pulse (!) 114   Temp 97.8 F (36.6 C) (Axillary)   Resp 17   Ht 5\' 7"  (1.702 m)   Wt 79.7 kg   SpO2 94%   BMI 27.52 kg/m   Physical Exam: awake, LLQ drain intact, insertion site ok,  OP 175 cc recorded, yellow fluid   Imaging: DG Abd 1 View  Result Date: 11/24/2021 CLINICAL DATA:  NG placement. EXAM: ABDOMEN - 1 VIEW COMPARISON:  Abdominal radiograph dated 11/24/2021. FINDINGS: Enteric tube with weighted tip to the right of the spine likely in the distal stomach or proximal duodenum. Partially visualized bilateral ureteral stents. Diffuse interstitial coarsening and streaky and nodular densities. Left-sided PICC with tip at the cavoatrial junction. IMPRESSION: Enteric tube with tip in the distal stomach or proximal duodenum. Electronically Signed   By: Anner Crete M.D.   On: 11/24/2021 19:26   DG Abd 1 View  Result Date: 11/24/2021 CLINICAL DATA:  80 year old male status post feeding tube  placement. EXAM: ABDOMEN - 1 VIEW COMPARISON:  11/16/2021 FINDINGS: Weighted tip enteric feeding tube distal tip is at the level of the pylorus/first portion of the duodenum. Nonspecific bowel gas pattern. Similar appearance of indwelling bilateral nephroureteral stents. Partially visualized lumbar spinal fusion hardware. IMPRESSION: Enteric feeding tube weighted tip is located at the level of the pylorus/first portion of the duodenum. Electronically Signed   By: Ruthann Cancer M.D.   On: 11/24/2021 13:35   DG Chest Port 1 View  Result Date: 11/23/2021 CLINICAL DATA:  PICC line placement. EXAM: PORTABLE CHEST 1 VIEW COMPARISON:  11/17/2021 studies FINDINGS: A LEFT-sided PICC line is noted with tip overlying the mid-LOWER SVC. A RIGHT PICC line is again identified with tip in the region of the SUPERIOR cavoatrial junction. New diffuse bilateral airspace opacities are noted. No pneumothorax or large pleural effusion identified. No acute bony abnormalities are present. IMPRESSION: 1. LEFT PICC line placement with tip overlying the mid-LOWER SVC 2. New diffuse bilateral airspace opacities, question edema or infection. Electronically Signed   By: Margarette Canada M.D.   On: 11/23/2021 11:49   VAS [place] UPPER EXTREMITY VENOUS DUPLEX  Result Date: 11/25/2021 UPPER VENOUS STUDY  Patient Name:  William [name]  Date of Exam:   11/24/2021 Medical Rec #: [id number]       Accession #:    [id number] Date of Birth: 01-05-1941       Patient Gender: M Patient Age:   80 years Exam Location:  [facility] Procedure:  VAS [place] UPPER EXTREMITY VENOUS DUPLEX Referring Phys: CORNELIUS VAN DAM --------------------------------------------------------------------------------  Indications: Swelling Risk Factors: PICC line placement. Comparison Study: Previous BUEV on 11/14/21 was negative for DVT. Performing Technologist: Charmayne Sheer  Examination Guidelines: A complete evaluation includes B-mode imaging, spectral Doppler, color  Doppler, and power Doppler as needed of all accessible portions of each vessel. Bilateral testing is considered an integral part of a complete examination. Limited examinations for reoccurring indications may be performed as noted.  Right Findings: +----------+------------+---------+-----------+----------+-------+ RIGHT     CompressiblePhasicitySpontaneousPropertiesSummary +----------+------------+---------+-----------+----------+-------+ Subclavian    Full       Yes       Yes                      +----------+------------+---------+-----------+----------+-------+  Left Findings: +----------+------------+---------+-----------+----------+-------+ LEFT      CompressiblePhasicitySpontaneousPropertiesSummary +----------+------------+---------+-----------+----------+-------+ IJV           Full       Yes       Yes                      +----------+------------+---------+-----------+----------+-------+ Subclavian    Full       Yes       Yes                      +----------+------------+---------+-----------+----------+-------+ Axillary      Full       Yes       Yes                      +----------+------------+---------+-----------+----------+-------+ Brachial      Full       Yes       Yes                      +----------+------------+---------+-----------+----------+-------+ Radial        Full                                          +----------+------------+---------+-----------+----------+-------+ Ulnar         Full                                          +----------+------------+---------+-----------+----------+-------+ Cephalic      None       No        No                Acute  +----------+------------+---------+-----------+----------+-------+ Basilic       Full       Yes       Yes                      +----------+------------+---------+-----------+----------+-------+ Visualization of the brachial and basilic veins were limited due to PICC  line placement.  Summary:  Right: No evidence of thrombosis in the subclavian.  Left: No evidence of deep vein thrombosis in the upper extremity. Findings consistent with acute superficial vein thrombosis involving the left cephalic vein.  *See table(s) above for measurements and observations.  Diagnosing physician: Orlie Pollen Electronically signed by Orlie Pollen on 11/25/2021 at 10:44:13 AM.    Final    VAS [place] UPPER EXTREMITY VENOUS DUPLEX  Result Date: 11/24/2021 UPPER VENOUS STUDY  Patient Name:  William [name]  Date of Exam:   11/23/2021 Medical Rec #: [id number]       Accession #:    [id number] Date of Birth: [birth year]       Patient Gender: M Patient Age:   80 years Exam Location:  [facility] Procedure:      VAS [place] UPPER EXTREMITY VENOUS DUPLEX Referring Phys: CORNELIUS VAN DAM --------------------------------------------------------------------------------  Indications: RUE swelling Risk Factors: PICC line placement. Limitations: Line and bandages. Comparison Study: Previous exam of BUE on 11/14/2021 was negative for DVT. Performing Technologist: Rogelia Rohrer RVT, RDMS  Examination Guidelines: A complete evaluation includes B-mode imaging, spectral Doppler, color Doppler, and power Doppler as needed of all accessible portions of each vessel. Bilateral testing is considered an integral part of a complete examination. Limited examinations for reoccurring indications may be performed as noted.  Right Findings: +----------+------------+---------+-----------+----------+---------------------+ RIGHT     CompressiblePhasicitySpontaneousProperties       Summary        +----------+------------+---------+-----------+----------+---------------------+ IJV           Full       Yes       Yes                                    +----------+------------+---------+-----------+----------+---------------------+ Subclavian    Full       Yes       Yes                                     +----------+------------+---------+-----------+----------+---------------------+ Axillary      Full       Yes       Yes                                    +----------+------------+---------+-----------+----------+---------------------+ Brachial    Partial      No        No                  Acute in one of                                                            paired veins      +----------+------------+---------+-----------+----------+---------------------+ Radial        Full                                                        +----------+------------+---------+-----------+----------+---------------------+ Ulnar         Full                                                        +----------+------------+---------+-----------+----------+---------------------+ Cephalic      Full                                                        +----------+------------+---------+-----------+----------+---------------------+  Basilic       None       No        No                       Acute         +----------+------------+---------+-----------+----------+---------------------+ Difficulty imaging brachial and basilic veins due to PICC line placement.  Left Findings: +----------+------------+---------+-----------+----------+-------+ LEFT      CompressiblePhasicitySpontaneousPropertiesSummary +----------+------------+---------+-----------+----------+-------+ Subclavian    Full       Yes       Yes                      +----------+------------+---------+-----------+----------+-------+  Summary:  Right: Findings consistent with acute deep vein thrombosis involving the right brachial veins. Findings consistent with acute superficial vein thrombosis involving the right basilic vein.  Left: No evidence of thrombosis in the subclavian.  *See table(s) above for measurements and observations.  Diagnosing physician: Orlie Pollen Electronically signed by Orlie Pollen on  11/24/2021 at 10:48:00 AM.    Final    [place] EKG SITE RITE  Result Date: 11/22/2021 If Site Rite image not attached, placement could not be confirmed due to current cardiac rhythm.   Labs:  CBC: Recent Labs    11/22/21 0500 11/23/21 0400 11/24/21 0549 11/25/21 0427  WBC 9.2 8.3 10.2 9.2  HGB 9.7* 9.0* 9.3* 9.9*  HCT 29.7* 27.7* 29.4* 32.0*  PLT 308 315 375 390     COAGS: Recent Labs    06/29/21 0830 11/28/2021 1016 11/10/21 0500 11/18/21 1928  INR 1.0 1.4* 1.5* 1.3*  APTT  --  38*  --   --      BMP: Recent Labs    11/23/21 0400 11/24/21 0549 11/24/21 1606 11/25/21 0427  NA 141 146* 149* 151*  K 3.2* 2.8* 3.1* 2.6*  CL 118* 116* 117* 116*  CO2 20* 23 22 26   GLUCOSE 89 106* 107* 102*  BUN 17 10 10 12   CALCIUM 7.7* 8.2* 8.6* 8.6*  CREATININE 1.41* 0.88 0.82 0.83  GFRNONAA 51* >60 >60 >60     LIVER FUNCTION TESTS: Recent Labs    11/20/21 0540 11/21/21 0515 11/22/21 0500 11/23/21 0400  BILITOT 0.7 0.7 1.0 1.0  AST 26 27 26 19   ALT 16 16 15 12   ALKPHOS 78 67 83 71  PROT 5.7* 5.2* 5.5* 5.8*  ALBUMIN 1.6* 1.5*  <1.5* <1.5* 2.3*     Assessment and Plan: Pt with hx bladder ca with prior cystectomy/conduit diversion/stenting/prostatectomy, SBR 10/25/21; s/p drainage of LLQ abd fluid collection 11/18/21;afebrile; fluid creat pending (r/o urine leak)-  IR following   Electronically Signed: Ascencion Dike, PA-C 11/25/2021, 11:51 AM   I spent a total of 15 Minutes at the the patient's bedside AND on the patient's hospital floor or unit, greater than 50% of which was counseling/coordinating care for left lower abdominal fluid collection drain    Patient ID: William [name], male   DOB: [birth year], 80 y.o.   MRN: [id number]

## 2021-11-26 ENCOUNTER — Inpatient Hospital Stay (HOSPITAL_COMMUNITY): Payer: Medicare HMO

## 2021-11-26 DIAGNOSIS — R0902 Hypoxemia: Secondary | ICD-10-CM

## 2021-11-26 DIAGNOSIS — Z452 Encounter for adjustment and management of vascular access device: Secondary | ICD-10-CM

## 2021-11-26 DIAGNOSIS — A419 Sepsis, unspecified organism: Secondary | ICD-10-CM | POA: Diagnosis not present

## 2021-11-26 DIAGNOSIS — Z0189 Encounter for other specified special examinations: Secondary | ICD-10-CM

## 2021-11-26 DIAGNOSIS — K56609 Unspecified intestinal obstruction, unspecified as to partial versus complete obstruction: Secondary | ICD-10-CM

## 2021-11-26 DIAGNOSIS — R6521 Severe sepsis with septic shock: Secondary | ICD-10-CM | POA: Diagnosis not present

## 2021-11-26 DIAGNOSIS — C679 Malignant neoplasm of bladder, unspecified: Secondary | ICD-10-CM | POA: Diagnosis not present

## 2021-11-26 DIAGNOSIS — Z978 Presence of other specified devices: Secondary | ICD-10-CM

## 2021-11-26 DIAGNOSIS — B377 Candidal sepsis: Secondary | ICD-10-CM

## 2021-11-26 DIAGNOSIS — Z4659 Encounter for fitting and adjustment of other gastrointestinal appliance and device: Secondary | ICD-10-CM

## 2021-11-26 DIAGNOSIS — R7881 Bacteremia: Secondary | ICD-10-CM | POA: Diagnosis not present

## 2021-11-26 LAB — CBC
HCT: 33.3 % — ABNORMAL LOW (ref 39.0–52.0)
Hemoglobin: 10.3 g/dL — ABNORMAL LOW (ref 13.0–17.0)
MCH: 29.4 pg (ref 26.0–34.0)
MCHC: 30.9 g/dL (ref 30.0–36.0)
MCV: 95.1 fL (ref 80.0–100.0)
Platelets: 355 10*3/uL (ref 150–400)
RBC: 3.5 MIL/uL — ABNORMAL LOW (ref 4.22–5.81)
RDW: 15.9 % — ABNORMAL HIGH (ref 11.5–15.5)
WBC: 9.7 10*3/uL (ref 4.0–10.5)
nRBC: 0 % (ref 0.0–0.2)

## 2021-11-26 LAB — FUNGUS CULTURE, BLOOD: Special Requests: ADEQUATE

## 2021-11-26 LAB — BASIC METABOLIC PANEL
Anion gap: 3 — ABNORMAL LOW (ref 5–15)
Anion gap: <3 — ABNORMAL LOW (ref 5–15)
BUN: 14 mg/dL (ref 8–23)
BUN: 16 mg/dL (ref 8–23)
CO2: 27 mmol/L (ref 22–32)
CO2: 27 mmol/L (ref 22–32)
Calcium: 9 mg/dL (ref 8.9–10.3)
Calcium: 8.9 mg/dL (ref 8.9–10.3)
Chloride: 129 mmol/L — ABNORMAL HIGH (ref 98–111)
Chloride: 129 mmol/L — ABNORMAL HIGH (ref 98–111)
Creatinine, Ser: 0.83 mg/dL (ref 0.61–1.24)
Creatinine, Ser: 0.83 mg/dL (ref 0.61–1.24)
GFR, Estimated: >60 mL/min (ref >60)
GFR, Estimated: >60 mL/min (ref >60)
Glucose, Bld: 143 mg/dL — ABNORMAL HIGH (ref 70–99)
Glucose, Bld: 135 mg/dL — ABNORMAL HIGH (ref 70–99)
Potassium: 3.3 mmol/L — ABNORMAL LOW (ref 3.5–5.1)
Potassium: 3.8 mmol/L (ref 3.5–5.1)
Sodium: 159 mmol/L — ABNORMAL HIGH (ref 135–145)
Sodium: 158 mmol/L — ABNORMAL HIGH (ref 135–145)

## 2021-11-26 LAB — GLUCOSE, CAPILLARY
Glucose-Capillary: 118 mg/dL — ABNORMAL HIGH (ref 70–99)
Glucose-Capillary: 130 mg/dL — ABNORMAL HIGH (ref 70–99)
Glucose-Capillary: 131 mg/dL — ABNORMAL HIGH (ref 70–99)
Glucose-Capillary: 133 mg/dL — ABNORMAL HIGH (ref 70–99)
Glucose-Capillary: 137 mg/dL — ABNORMAL HIGH (ref 70–99)
Glucose-Capillary: 139 mg/dL — ABNORMAL HIGH (ref 70–99)
Glucose-Capillary: 142 mg/dL — ABNORMAL HIGH (ref 70–99)
Glucose-Capillary: 162 mg/dL — ABNORMAL HIGH (ref 70–99)

## 2021-11-26 LAB — PROCALCITONIN: Procalcitonin: <0.1 ng/mL

## 2021-11-26 LAB — MAGNESIUM: Magnesium: 2 mg/dL (ref 1.7–2.4)

## 2021-11-26 LAB — PHOSPHORUS: Phosphorus: 2.6 mg/dL (ref 2.5–4.6)

## 2021-11-26 MED ORDER — METOPROLOL TARTRATE 25 MG PO TABS
37.5000 mg | ORAL_TABLET | Freq: Two times a day (BID) | ORAL | Status: DC
  Administered 2021-11-26 – 2021-11-29 (×4): 37.5 mg
  Filled 2021-11-26 (×6): qty 1

## 2021-11-26 MED ORDER — FREE WATER
50.0000 mL | Status: DC
  Administered 2021-11-26 – 2021-11-29 (×18): 50 mL

## 2021-11-26 MED ORDER — DEXTROSE 5 % IV SOLN: INTRAVENOUS | Status: DC

## 2021-11-26 MED ORDER — POTASSIUM CHLORIDE 20 MEQ PO PACK
40.0000 meq | PACK | ORAL | Status: AC
  Administered 2021-11-26 (×2): 40 meq
  Filled 2021-11-26 (×2): qty 2

## 2021-11-26 MED ORDER — HYDROMORPHONE HCL 1 MG/ML IJ SOLN
1.0000 mg | Freq: Once | INTRAMUSCULAR | Status: AC
  Administered 2021-11-26: 03:00:00 1 mg via INTRAVENOUS
  Filled 2021-11-26: qty 1

## 2021-11-26 NOTE — Progress Notes (Signed)
Subjective: Patient was encephalopathic and restrained when I saw him.  Antibiotics:  Anti-infectives (From admission, onward)    Start     Dose/Rate Route Frequency Ordered Stop   11/20/21 1000  anidulafungin (ERAXIS) 100 mg in sodium chloride 0.9 % 100 mL IVPB        100 mg 78 mL/hr over 100 Minutes Intravenous Every 24 hours 11/19/21 0729     11/19/21 1200  fluconazole (DIFLUCAN) IVPB 200 mg  Status:  Discontinued        200 mg 100 mL/hr over 60 Minutes Intravenous Every 24 hours 11/18/21 0852 11/19/21 0729   11/19/21 0900  anidulafungin (ERAXIS) 200 mg in sodium chloride 0.9 % 200 mL IVPB        200 mg 78 mL/hr over 200 Minutes Intravenous  Once 11/19/21 0729 11/19/21 1220   11/18/21 0945  fluconazole (DIFLUCAN) IVPB 800 mg        800 mg 200 mL/hr over 120 Minutes Intravenous  Once 11/18/21 0852 11/18/21 1204   11/17/21 1600  piperacillin-tazobactam (ZOSYN) IVPB 3.375 g  Status:  Discontinued        3.375 g 12.5 mL/hr over 240 Minutes Intravenous Every 8 hours 11/17/21 1333 11/24/21 1105   11/12/21 0800  Ampicillin-Sulbactam (UNASYN) 3 g in sodium chloride 0.9 % 100 mL IVPB  Status:  Discontinued        3 g 200 mL/hr over 30 Minutes Intravenous Every 6 hours 11/12/21 0712 11/17/21 1333   11/10/21 1400  cefTRIAXone (ROCEPHIN) 2 g in sodium chloride 0.9 % 100 mL IVPB  Status:  Discontinued        2 g 200 mL/hr over 30 Minutes Intravenous Daily 11/10/21 1048 11/12/21 0712   11/10/21 0800  ceFEPIme (MAXIPIME) 2 g in sodium chloride 0.9 % 100 mL IVPB  Status:  Discontinued        2 g 200 mL/hr over 30 Minutes Intravenous Every 12 hours 11/10/21 0618 11/10/21 1048   11/10/21 0630  ceFEPIme (MAXIPIME) 2 g in sodium chloride 0.9 % 100 mL IVPB  Status:  Discontinued        2 g 200 mL/hr over 30 Minutes Intravenous Every 24 hours 10/31/2021 1622 11/10/21 0618   11/10/2021 1624  vancomycin variable dose per unstable renal function (pharmacist dosing)  Status:  Discontinued          Does not apply See admin instructions 11/05/2021 1624 11/10/21 0613   11/05/2021 1030  ceFEPIme (MAXIPIME) 2 g in sodium chloride 0.9 % 100 mL IVPB        2 g 200 mL/hr over 30 Minutes Intravenous  Once 11/19/2021 1016 11/18/2021 1133   11/18/2021 1030  metroNIDAZOLE (FLAGYL) IVPB 500 mg        500 mg 100 mL/hr over 60 Minutes Intravenous  Once 11/16/2021 1016 11/23/2021 1915   11/14/2021 1030  vancomycin (VANCOCIN) IVPB 1000 mg/200 mL premix  Status:  Discontinued        1,000 mg 200 mL/hr over 60 Minutes Intravenous  Once 11/05/2021 1016 11/20/2021 1023   11/22/2021 1030  vancomycin (VANCOREADY) IVPB 1500 mg/300 mL        1,500 mg 150 mL/hr over 120 Minutes Intravenous  Once 11/12/2021 1023 11/11/2021 1341       Medications: Scheduled Meds:  chlorhexidine  15 mL Mouth Rinse BID   Chlorhexidine Gluconate Cloth  6 each Topical Daily   enoxaparin (LOVENOX) injection  80 mg Subcutaneous Q12H  feeding supplement (PROSource TF)  45 mL Per Tube BID   fluticasone  2 spray Each Nare Daily   free water  50 mL Per Tube Q4H   insulin aspart  0-9 Units Subcutaneous Q6H   lip balm  1 application Topical BID   mouth rinse  15 mL Mouth Rinse q12n4p   metoprolol tartrate  37.5 mg Per Tube BID   multivitamin  15 mL Per Tube Daily   nystatin  5 mL Oral QID   nystatin   Topical BID   QUEtiapine  12.5 mg Per Tube QHS   saccharomyces boulardii  250 mg Per Tube BID   sodium chloride flush  10-40 mL Intracatheter Q12H   sodium chloride flush  10-40 mL Intracatheter Q12H   sodium chloride flush  5 mL Intracatheter Q8H   thiamine injection  100 mg Intravenous Daily   traZODone  50 mg Per Tube QHS   umeclidinium bromide  1 puff Inhalation Daily   Continuous Infusions:  sodium chloride Stopped (11/23/21 1558)   sodium chloride Stopped (11/21/21 1838)   anidulafungin Stopped (11/26/21 1117)   chlorproMAZINE (THORAZINE) IV Stopped (11/22/21 0414)   dextrose 50 mL/hr at 11/26/21 0956   feeding supplement (OSMOLITE 1.5  CAL) 1,000 mL (11/26/21 1619)   methocarbamol (ROBAXIN) IV Stopped (11/20/21 2252)   ondansetron (ZOFRAN) IV     PRN Meds:.sodium chloride, acetaminophen, ALPRAZolam, chlorproMAZINE (THORAZINE) IV, diphenhydrAMINE, docusate **AND** sennosides, guaiFENesin, hydrALAZINE, HYDROmorphone (DILAUDID) injection, magic mouthwash, menthol-cetylpyridinium, methocarbamol (ROBAXIN) IV, metoprolol tartrate, ondansetron (ZOFRAN) IV **OR** ondansetron (ZOFRAN) IV, phenol, sodium chloride flush, sodium chloride flush    Objective: Weight change: -3.7 kg  Intake/Output Summary (Last 24 hours) at 11/26/2021 1831 Last data filed at 11/26/2021 1629 Gross per 24 hour  Intake 25 ml  Output 5275 ml  Net -5250 ml    Blood pressure (!) 149/71, pulse 87, temperature 99 F (37.2 C), temperature source Axillary, resp. rate (!) 30, height 5\' 7"  (1.702 m), weight 76 kg, SpO2 93 %. Temp:  [97.6 F (36.4 C)-99.2 F (37.3 C)] 99 F (37.2 C) (11/27 1600) Pulse Rate:  [87-138] 87 (11/27 1700) Resp:  [18-33] 30 (11/27 1700) BP: (113-165)/(57-116) 149/71 (11/27 1700) SpO2:  [91 %-97 %] 93 % (11/27 1700) FiO2 (%):  [36 %] 36 % (11/27 0913) Weight:  [76 kg] 76 kg (11/27 0600)  Physical Exam: Physical Exam Constitutional:      Appearance: He is ill-appearing.  HENT:     Head: Normocephalic and atraumatic.  Eyes:     General:        Right eye: No discharge.        Left eye: No discharge.  Cardiovascular:     Rate and Rhythm: Tachycardia present.  Pulmonary:     Effort: No respiratory distress.     Breath sounds: No wheezing.  Abdominal:     General: There is no distension.  Neurological:     Mental Status: He is disoriented.  Psychiatric:        Cognition and Memory: Cognition is impaired. Memory is impaired. He exhibits impaired recent memory and impaired remote memory.    Drain in place  RUE edematous  LUE with PICC still present  NG-tube in place   CBC:    BMET Recent Labs     11/26/21 0538 11/26/21 1607  NA 159* 158*  K 3.3* 3.8  CL 129* 129*  CO2 27 27  GLUCOSE 143* 135*  BUN 14 16  CREATININE 0.83 0.83  CALCIUM 9.0 8.9      Liver Panel  No results for input(s): PROT, ALBUMIN, AST, ALT, ALKPHOS, BILITOT, BILIDIR, IBILI in the last 72 hours.     Sedimentation Rate No results for input(s): ESRSEDRATE in the last 72 hours. C-Reactive Protein No results for input(s): CRP in the last 72 hours.  Micro Results: Recent Results (from the past 720 hour(s))  Blood Culture (routine x 2)     Status: Abnormal   Collection Time: 11/04/2021 10:16 AM   Specimen: BLOOD RIGHT ARM  Result Value Ref Range Status   Specimen Description BLOOD RIGHT ARM  Final   Special Requests   Final    BOTTLES DRAWN AEROBIC AND ANAEROBIC Blood Culture adequate volume   Culture  Setup Time   Final    GRAM NEGATIVE RODS IN BOTH AEROBIC AND ANAEROBIC BOTTLES CRITICAL RESULT CALLED TO, READ BACK BY AND VERIFIED WITH: M LILLISTON,PHARMD@0544  11/10/21 [place] Performed at [facility], [place] [address] [place], [place] [address]    Culture KLEBSIELLA OXYTOCA (A)  Final   Report Status 11/12/2021 FINAL  Final   Organism ID, Bacteria KLEBSIELLA OXYTOCA  Final      Susceptibility   Klebsiella oxytoca - MIC*    AMPICILLIN >=32 RESISTANT Resistant     CEFAZOLIN 8 SENSITIVE Sensitive     CEFEPIME <=0.12 SENSITIVE Sensitive     CEFTAZIDIME <=1 SENSITIVE Sensitive     CEFTRIAXONE <=0.25 SENSITIVE Sensitive     CIPROFLOXACIN <=0.25 SENSITIVE Sensitive     GENTAMICIN <=1 SENSITIVE Sensitive     IMIPENEM <=0.25 SENSITIVE Sensitive     TRIMETH/SULFA <=20 SENSITIVE Sensitive     AMPICILLIN/SULBACTAM 8 SENSITIVE Sensitive     PIP/TAZO <=4 SENSITIVE Sensitive     * KLEBSIELLA OXYTOCA  Urine Culture     Status: Abnormal   Collection Time: 11/06/2021 10:16 AM   Specimen: In/Out Cath Urine  Result Value Ref Range Status   Specimen Description   Final    IN/OUT CATH URINE Performed at  [facility] [address], [place], [place] [address]    Special Requests   Final    NONE Performed at [facility], [place] [address], [place], [place] [address]    Culture (A)  Final    >=100,000 COLONIES/mL KLEBSIELLA OXYTOCA >=100,000 COLONIES/mL ENTEROCOCCUS FAECALIS    Report Status 11/12/2021 FINAL  Final   Organism ID, Bacteria KLEBSIELLA OXYTOCA (A)  Final   Organism ID, Bacteria ENTEROCOCCUS FAECALIS (A)  Final      Susceptibility   Enterococcus faecalis - MIC*    AMPICILLIN <=2 SENSITIVE Sensitive     NITROFURANTOIN <=16 SENSITIVE Sensitive     VANCOMYCIN 1 SENSITIVE Sensitive     * >=100,000 COLONIES/mL ENTEROCOCCUS FAECALIS   Klebsiella oxytoca - MIC*    AMPICILLIN >=32 RESISTANT Resistant     CEFAZOLIN 8 SENSITIVE Sensitive     CEFEPIME <=0.12 SENSITIVE Sensitive     CEFTRIAXONE <=0.25 SENSITIVE Sensitive     CIPROFLOXACIN <=0.25 SENSITIVE Sensitive     GENTAMICIN <=1 SENSITIVE Sensitive     IMIPENEM <=0.25 SENSITIVE Sensitive     NITROFURANTOIN 32 SENSITIVE Sensitive     TRIMETH/SULFA <=20 SENSITIVE Sensitive     AMPICILLIN/SULBACTAM 8 SENSITIVE Sensitive     PIP/TAZO <=4 SENSITIVE Sensitive     * >=100,000 COLONIES/mL KLEBSIELLA OXYTOCA  Blood Culture ID Panel (Reflexed)     Status: Abnormal   Collection Time: 11/15/2021 10:16 AM  Result Value  Ref Range Status   Enterococcus faecalis NOT DETECTED NOT DETECTED Final   Enterococcus Faecium NOT DETECTED NOT DETECTED Final   Listeria monocytogenes NOT DETECTED NOT DETECTED Final   Staphylococcus species NOT DETECTED NOT DETECTED Final   Staphylococcus aureus (BCID) NOT DETECTED NOT DETECTED Final   Staphylococcus epidermidis NOT DETECTED NOT DETECTED Final   Staphylococcus lugdunensis NOT DETECTED NOT DETECTED Final   Streptococcus species NOT DETECTED NOT DETECTED Final   Streptococcus agalactiae NOT DETECTED NOT DETECTED Final   Streptococcus pneumoniae NOT DETECTED NOT  DETECTED Final   Streptococcus pyogenes NOT DETECTED NOT DETECTED Final   A.calcoaceticus-baumannii NOT DETECTED NOT DETECTED Final   Bacteroides fragilis NOT DETECTED NOT DETECTED Final   Enterobacterales DETECTED (A) NOT DETECTED Final    Comment: Enterobacterales represent a large order of gram negative bacteria, not a single organism. CRITICAL RESULT CALLED TO, READ BACK BY AND VERIFIED WITH: M LILLISTON,PHARMD@[id number]  11/10/21 [place]    Enterobacter cloacae complex NOT DETECTED NOT DETECTED Final   Escherichia coli NOT DETECTED NOT DETECTED Final   Klebsiella aerogenes NOT DETECTED NOT DETECTED Final   Klebsiella oxytoca DETECTED (A) NOT DETECTED Final    Comment: CRITICAL RESULT CALLED TO, READ BACK BY AND VERIFIED WITH: M LILLISTON,PHARMD@[id number]  11/10/21 [place]    Klebsiella pneumoniae NOT DETECTED NOT DETECTED Final   Proteus species NOT DETECTED NOT DETECTED Final   Salmonella species NOT DETECTED NOT DETECTED Final   Serratia marcescens NOT DETECTED NOT DETECTED Final   Haemophilus influenzae NOT DETECTED NOT DETECTED Final   Neisseria meningitidis NOT DETECTED NOT DETECTED Final   Pseudomonas aeruginosa NOT DETECTED NOT DETECTED Final   Stenotrophomonas maltophilia NOT DETECTED NOT DETECTED Final   Candida albicans NOT DETECTED NOT DETECTED Final   Candida auris NOT DETECTED NOT DETECTED Final   Candida glabrata NOT DETECTED NOT DETECTED Final   Candida krusei NOT DETECTED NOT DETECTED Final   Candida parapsilosis NOT DETECTED NOT DETECTED Final   Candida tropicalis NOT DETECTED NOT DETECTED Final   Cryptococcus neoformans/gattii NOT DETECTED NOT DETECTED Final   CTX-M ESBL NOT DETECTED NOT DETECTED Final   Carbapenem resistance IMP NOT DETECTED NOT DETECTED Final   Carbapenem resistance KPC NOT DETECTED NOT DETECTED Final   Carbapenem resistance NDM NOT DETECTED NOT DETECTED Final   Carbapenem resist OXA 48 LIKE NOT DETECTED NOT DETECTED Final   Carbapenem resistance VIM NOT  DETECTED NOT DETECTED Final    Comment: Performed at [facility], [address] [place], [place] [address]  Blood Culture (routine x 2)     Status: Abnormal   Collection Time: 11/14/2021 10:21 AM   Specimen: BLOOD RIGHT WRIST  Result Value Ref Range Status   Specimen Description   Final    BLOOD RIGHT WRIST Performed at [facility], [address] [place], [place] [address]    Special Requests   Final    BOTTLES DRAWN AEROBIC ONLY Blood Culture adequate volume Performed at [facility] [address], [place], [place] [address]    Culture  Setup Time   Final    GRAM NEGATIVE RODS AEROBIC BOTTLE ONLY CRITICAL VALUE NOTED.  VALUE IS CONSISTENT WITH PREVIOUSLY REPORTED AND CALLED VALUE.    Culture (A)  Final    KLEBSIELLA OXYTOCA SUSCEPTIBILITIES PERFORMED ON PREVIOUS CULTURE WITHIN THE LAST 5 DAYS. Performed at [facility], [place] [address], [place],  [address]    Report Status 11/12/2021 FINAL  Final  Resp Panel by  RT-PCR (Flu A&B, Covid) Nasopharyngeal Swab     Status: None   Collection Time: 11/13/2021 11:18 AM   Specimen: Nasopharyngeal Swab; Nasopharyngeal(NP) swabs in vial transport medium  Result Value Ref Range Status   SARS Coronavirus 2 by RT PCR NEGATIVE NEGATIVE Final    Comment: (NOTE) SARS-CoV-2 target nucleic acids are NOT DETECTED.  The SARS-CoV-2 RNA is generally detectable in upper respiratory specimens during the acute phase of infection. The lowest concentration of SARS-CoV-2 viral copies this assay can detect is 138 copies/mL. A negative result does not preclude SARS-Cov-2 infection and should not be used as the sole basis for treatment or other patient management decisions. A negative result may occur with  improper specimen collection/handling, submission of specimen other than nasopharyngeal swab, presence of viral mutation(s) within the areas targeted by this assay, and inadequate number of viral copies(<138  copies/mL). A negative result must be combined with clinical observations, patient history, and epidemiological information. The expected result is Negative.  Fact Sheet for Patients:  EntrepreneurPulse.com.au  Fact Sheet for Healthcare Providers:  IncredibleEmployment.be  This test is no t yet approved or cleared by the [place] FDA and  has been authorized for detection and/or diagnosis of SARS-CoV-2 by FDA under an Emergency Use Authorization (EUA). This EUA will remain  in effect (meaning this test can be used) for the duration of the COVID-19 declaration under Section 564(b)(1) of the Act, 21 U.S.C.section 360bbb-3(b)(1), unless the authorization is terminated  or revoked sooner.       Influenza A by PCR NEGATIVE NEGATIVE Final   Influenza B by PCR NEGATIVE NEGATIVE Final    Comment: (NOTE) The Xpert Xpress SARS-CoV-2/FLU/RSV plus assay is intended as an aid in the diagnosis of influenza from Nasopharyngeal swab specimens and should not be used as a sole basis for treatment. Nasal washings and aspirates are unacceptable for Xpert Xpress SARS-CoV-2/FLU/RSV testing.  Fact Sheet for Patients: EntrepreneurPulse.com.au  Fact Sheet for Healthcare Providers: IncredibleEmployment.be  This test is not yet approved or cleared by the [place] FDA and has been authorized for detection and/or diagnosis of SARS-CoV-2 by FDA under an Emergency Use Authorization (EUA). This EUA will remain in effect (meaning this test can be used) for the duration of the COVID-19 declaration under Section 564(b)(1) of the Act, 21 U.S.C. section 360bbb-3(b)(1), unless the authorization is terminated or revoked.  Performed at [facility], [place] [address] [place] [address] [place], [place] [address]   Culture, blood (routine x 2)     Status: Abnormal   Collection Time: 11/17/21  5:10 AM   Specimen: BLOOD  Result Value  Ref Range Status   Specimen Description   Final    BLOOD BLOOD LEFT HAND Performed at [facility] [address], [place], [place] [address]    Special Requests   Final    BOTTLES DRAWN AEROBIC ONLY Blood Culture adequate volume Performed at [facility] [address] [place],  [address]    Culture  Setup Time   Final    YEAST AEROBIC BOTTLE ONLY CRITICAL VALUE NOTED.  VALUE IS CONSISTENT WITH PREVIOUSLY REPORTED AND CALLED VALUE.    Culture (A)  Final    CANDIDA ALBICANS SUSCEPTIBILITIES PERFORMED ON PREVIOUS CULTURE WITHIN THE LAST 5 DAYS. Performed at [facility], [place] [address], [place],  [address]    Report Status 11/23/2021 FINAL  Final  Culture, blood (routine x 2)     Status: Abnormal   Collection  Time: 11/17/21  5:20 AM   Specimen: BLOOD  Result Value Ref Range Status   Specimen Description   Final    BLOOD BLOOD LEFT WRIST Performed at [facility] [address], [place], [place] [address]    Special Requests   Final    BOTTLES DRAWN AEROBIC ONLY Blood Culture adequate volume Performed at [facility] [address], [place], [place] [address]    Culture  Setup Time   Final    YEAST AEROBIC BOTTLE ONLY CRITICAL RESULT CALLED TO, READ BACK BY AND VERIFIED WITH: A PHAM PHARMD @0821  11/18/21 [place]    Culture (A)  Final    CANDIDA ALBICANS SEE SEPARATE REPORT Performed at [facility], [address] [place], [place] [address]    Report Status 11/23/2021 FINAL  Final  Blood Culture ID Panel (Reflexed)     Status: Abnormal   Collection Time: 11/17/21  5:20 AM  Result Value Ref Range Status   Enterococcus faecalis NOT DETECTED NOT DETECTED Final   Enterococcus Faecium NOT DETECTED NOT DETECTED Final   Listeria monocytogenes NOT DETECTED NOT DETECTED Final   Staphylococcus species NOT DETECTED NOT DETECTED Final   Staphylococcus aureus (BCID) NOT DETECTED  NOT DETECTED Final   Staphylococcus epidermidis NOT DETECTED NOT DETECTED Final   Staphylococcus lugdunensis NOT DETECTED NOT DETECTED Final   Streptococcus species NOT DETECTED NOT DETECTED Final   Streptococcus agalactiae NOT DETECTED NOT DETECTED Final   Streptococcus pneumoniae NOT DETECTED NOT DETECTED Final   Streptococcus pyogenes NOT DETECTED NOT DETECTED Final   A.calcoaceticus-baumannii NOT DETECTED NOT DETECTED Final   Bacteroides fragilis NOT DETECTED NOT DETECTED Final   Enterobacterales NOT DETECTED NOT DETECTED Final   Enterobacter cloacae complex NOT DETECTED NOT DETECTED Final   Escherichia coli NOT DETECTED NOT DETECTED Final   Klebsiella aerogenes NOT DETECTED NOT DETECTED Final   Klebsiella oxytoca NOT DETECTED NOT DETECTED Final   Klebsiella pneumoniae NOT DETECTED NOT DETECTED Final   Proteus species NOT DETECTED NOT DETECTED Final   Salmonella species NOT DETECTED NOT DETECTED Final   Serratia marcescens NOT DETECTED NOT DETECTED Final   Haemophilus influenzae NOT DETECTED NOT DETECTED Final   Neisseria meningitidis NOT DETECTED NOT DETECTED Final   Pseudomonas aeruginosa NOT DETECTED NOT DETECTED Final   Stenotrophomonas maltophilia NOT DETECTED NOT DETECTED Final   Candida albicans DETECTED (A) NOT DETECTED Final    Comment: CRITICAL RESULT CALLED TO, READ BACK BY AND VERIFIED WITH: A PHAM PHARMD @0821  11/18/21 [place]    Candida auris NOT DETECTED NOT DETECTED Final   Candida glabrata NOT DETECTED NOT DETECTED Final   Candida krusei NOT DETECTED NOT DETECTED Final   Candida parapsilosis NOT DETECTED NOT DETECTED Final   Candida tropicalis NOT DETECTED NOT DETECTED Final   Cryptococcus neoformans/gattii NOT DETECTED NOT DETECTED Final    Comment: Performed at [facility], [address] [place], [place] [address]  Antifungal AST 9 Drug Panel     Status: None   Collection Time: 11/17/21  5:20 AM  Result Value Ref Range Status   Organism ID, Yeast Candida  albicans  Corrected    Comment: (NOTE) Identification performed by account, not confirmed by this laboratory. CORRECTED ON 11/24 AT 1336: PREVIOUSLY REPORTED AS Preliminary report    Amphotericin B MIC 0.5 ug/mL  Final    Comment: (NOTE) Breakpoints have been established for only some organism-drug combinations as indicated. This test was developed and its performance characteristics  determined by [facility]. It has not been cleared or approved by the Food and Drug Administration.    Anidulafungin MIC Comment  Final    Comment: (NOTE) 0.016 ug/mL Susceptible Breakpoints have been established for only some organism-drug combinations as indicated. This test was developed and its performance characteristics determined by [facility]. It has not been cleared or approved by the Food and Drug Administration.    Caspofungin MIC Comment  Final    Comment: (NOTE) 0.016 ug/mL Susceptible Breakpoints have been established for only some organism-drug combinations as indicated. This test was developed and its performance characteristics determined by [facility]. It has not been cleared or approved by the Food and Drug Administration.    Micafungin MIC Comment  Final    Comment: (NOTE) 0.016 ug/mL Susceptible Breakpoints have been established for only some organism-drug combinations as indicated. This test was developed and its performance characteristics determined by [facility]. It has not been cleared or approved by the Food and Drug Administration.    Posaconazole MIC 0.03 ug/mL  Final    Comment: (NOTE) Breakpoints have been established for only some organism-drug combinations as indicated. This test was developed and its performance characteristics determined by [facility]. It has not been cleared or approved by the Food and Drug Administration.    Fluconazole Islt MIC 0.5 ug/mL Susceptible  Final    Comment: (NOTE) Breakpoints have been established for only some  organism-drug combinations as indicated. This test was developed and its performance characteristics determined by [facility]. It has not been cleared or approved by the Food and Drug Administration.    Flucytosine MIC 1.0 ug/mL  Final    Comment: (NOTE) Breakpoints have been established for only some organism-drug combinations as indicated. This test was developed and its performance characteristics determined by [facility]. It has not been cleared or approved by the Food and Drug Administration.    Itraconazole MIC 0.12 ug/mL  Final    Comment: (NOTE) Breakpoints have been established for only some organism-drug combinations as indicated. This test was developed and its performance characteristics determined by [facility]. It has not been cleared or approved by the Food and Drug Administration.    Voriconazole MIC Comment  Final    Comment: (NOTE) 0.016 ug/mL Susceptible Breakpoints have been established for only some organism-drug combinations as indicated. This test was developed and its performance characteristics determined by [facility]. It has not been cleared or approved by the Food and Drug Administration. Performed At: [facility] [name], [place] [id number] Rush Farmer MD [id number]    Source [phone] SORUCE BLD CBenjamine Sprague SENSI  Final    Comment: Performed at [facility], [place] [address] [place],  [address]  MRSA Next Gen by PCR, Nasal     Status: None   Collection Time: 11/17/21  6:24 PM   Specimen: Nasal Mucosa; Nasal Swab  Result Value Ref Range Status   MRSA by PCR Next Gen NOT DETECTED NOT DETECTED Final    Comment: (NOTE) The GeneXpert MRSA Assay (FDA approved for NASAL specimens only), is one component of a comprehensive MRSA colonization surveillance program. It is not intended to diagnose MRSA infection nor to guide or monitor treatment for MRSA infections. Test performance is not FDA approved in patients less than 87  years old. Performed at [facility], [place] [address] [place], [place] [address]   C Difficile Quick Screen (NO PCR Reflex)     Status: None   Collection Time: 11/17/21  8:40  PM   Specimen: STOOL  Result Value Ref Range Status   C Diff antigen NEGATIVE NEGATIVE Final   C Diff toxin NEGATIVE NEGATIVE Final   C Diff interpretation NEGATIVE  Final    Comment: Performed at [facility], [place] [address] [place], [place] [address]  Aerobic/Anaerobic Culture w Gram Stain (surgical/deep wound)     Status: None   Collection Time: 11/18/21 11:01 AM   Specimen: Abdomen; Abscess  Result Value Ref Range Status   Specimen Description   Final    ABDOMEN Performed at [facility] [address], [place], [place] [address]    Special Requests   Final    ABSCES Performed at [facility], [place] [address], [place], [place] [address]    Gram Stain   Final    MODERATE WBC PRESENT,BOTH PMN AND MONONUCLEAR NO ORGANISMS SEEN    Culture   Final    RARE ESCHERICHIA COLI RARE CANDIDA ALBICANS NO ANAEROBES ISOLATED Performed at [facility], [address] [place], [place] [address]    Report Status 11/23/2021 FINAL  Final   Organism ID, Bacteria ESCHERICHIA COLI  Final      Susceptibility   Escherichia coli - MIC*    AMPICILLIN <=2 SENSITIVE Sensitive     CEFAZOLIN <=4 SENSITIVE Sensitive     CEFEPIME <=0.12 SENSITIVE Sensitive     CEFTAZIDIME <=1 SENSITIVE Sensitive     CEFTRIAXONE <=0.25 SENSITIVE Sensitive     CIPROFLOXACIN <=0.25 SENSITIVE Sensitive     GENTAMICIN <=1 SENSITIVE Sensitive     IMIPENEM <=0.25 SENSITIVE Sensitive     TRIMETH/SULFA <=20 SENSITIVE Sensitive     AMPICILLIN/SULBACTAM <=2 SENSITIVE Sensitive     PIP/TAZO <=4 SENSITIVE Sensitive     * RARE ESCHERICHIA COLI  Fungus Stain     Status: None   Collection Time: 11/18/21 11:01 AM   Specimen: Abscess  Result Value Ref Range Status    FUNGUS STAIN Final report  Final    Comment: (NOTE) Performed At: [facility] [address] [place], [place] [id number] Rush Farmer MD [id number]    Fungal Source ABSCESS  Final    Comment: Performed at [facility], [place] [address], [place], [place] [address]  Fungal Stain reflex     Status: None   Collection Time: 11/18/21 11:01 AM  Result Value Ref Range Status   Fungal stain result 1 Comment  Final    Comment: (NOTE) KOH/Calcofluor preparation:  no fungus observed. Performed At: [facility] [place], [place] [id number] Rush Farmer MD IR:[id number]   Culture, fungus without smear     Status: Abnormal (Preliminary result)   Collection Time: 11/18/21 11:02 AM   Specimen: Abscess; Other  Result Value Ref Range Status   Specimen Description   Final    ABSCESS Performed at [facility] [address], [place], [place] [address]    Special Requests ABDOMEN  Final   Culture (A)  Final    CANDIDA ALBICANS CONTINUING TO HOLD Performed at [facility], [place] [address] [place], [place] [address]    Report Status PENDING  Incomplete  Urine Culture     Status: Abnormal   Collection Time: 11/18/21  3:03 PM   Specimen: Urine, Clean Catch  Result Value Ref Range Status   Specimen Description   Final    URINE, CLEAN CATCH Performed at [facility], [place] [address] [place],  [place] [address]    Special Requests   Final    NONE Performed at [facility], [place] [address] [place], [place] [address]    Culture (A)  Final    <10,000 COLONIES/mL INSIGNIFICANT GROWTH Performed at [facility] [address] [place], [place] [address]    Report Status 11/20/2021 FINAL  Final  Culture, blood (routine x 2)     Status: Abnormal   Collection Time: 11/18/21  7:27 PM   Specimen: BLOOD  Result Value Ref Range Status   Specimen Description   Final    BLOOD BLOOD LEFT  WRIST Performed at [facility] [address] [place], [place] [address]    Special Requests   Final    BOTTLES DRAWN AEROBIC ONLY Blood Culture adequate volume Performed at [facility] [address] [place], [place] [address]    Culture  Setup Time   Final    YEAST AEROBIC BOTTLE ONLY CRITICAL VALUE NOTED.  VALUE IS CONSISTENT WITH PREVIOUSLY REPORTED AND CALLED VALUE.    Culture (A)  Final    CANDIDA ALBICANS SUSCEPTIBILITIES PERFORMED ON PREVIOUS CULTURE WITHIN THE LAST 5 DAYS. Performed at [facility], [place] [address] [place], [place] [address]    Report Status 11/25/2021 FINAL  Final  Fungus culture, blood     Status: Abnormal   Collection Time: 11/18/21  7:28 PM   Specimen: BLOOD  Result Value Ref Range Status   Specimen Description   Final    BLOOD BLOOD LEFT HAND Performed at [facility] [address], [place], [place] [address]    Special Requests   Final    BOTTLES DRAWN AEROBIC ONLY Blood Culture adequate volume Performed at [facility] [address] [place], [place] [address]    Fungal Smear   Final    YEAST AEROBIC BOTTLE ONLY CRITICAL VALUE NOTED.  VALUE IS CONSISTENT WITH PREVIOUSLY REPORTED AND CALLED VALUE.    Culture (A)  Final    CANDIDA ALBICANS SUSCEPTIBILITIES PERFORMED ON PREVIOUS CULTURE WITHIN THE LAST 5 DAYS. Performed at [facility], [place] [address] [place], [place] [address]    Report Status 11/26/2021 FINAL  Final  Culture, blood (routine x 2)     Status: None   Collection Time: 11/18/21  7:28 PM   Specimen: BLOOD  Result Value Ref Range Status   Specimen Description   Final    BLOOD BLOOD LEFT HAND Performed at [facility] [address] [place], [place] [address]    Special Requests   Final    BOTTLES DRAWN AEROBIC ONLY Blood Culture adequate volume Performed at [place] [address] [place], [place] [address]    Culture   Final    NO GROWTH 5 DAYS Performed at [facility], [place] [address] [place], [place] [address]    Report Status 11/24/2021 FINAL  Final  Culture, blood (Routine X 2) w Reflex to ID Panel     Status: None (Preliminary result)   Collection Time: 11/24/21  4:06 PM   Specimen: BLOOD  Result Value Ref Range Status   Specimen Description   Final    BLOOD BLOOD RIGHT HAND Performed at [facility] [address], [place], [place] [address]    Special Requests   Final    BOTTLES DRAWN AEROBIC ONLY Blood Culture results may not be optimal due to  an inadequate volume of blood received in culture bottles Performed at [facility] [address], [place], [place] [address]    Culture   Final    NO GROWTH 2 DAYS Performed at [facility] [address] [place], [place] [address]    Report Status PENDING  Incomplete  Culture, blood (Routine X 2) w Reflex to ID Panel     Status: None (Preliminary result)   Collection Time: 11/24/21  4:06 PM   Specimen: BLOOD  Result Value Ref Range Status   Specimen Description   Final    BLOOD BLOOD RIGHT WRIST Performed at [facility] [address], [place], [place] [address]    Special Requests   Final    BOTTLES DRAWN AEROBIC ONLY Blood Culture adequate volume Performed at [facility] [address], [place],  [address]    Culture   Final    NO GROWTH 2 DAYS Performed at [facility] [address] [place],  [address]    Report Status PENDING  Incomplete    Studies/Results: DG Abd 1 View  Result Date: 11/24/2021 CLINICAL DATA:  NG placement. EXAM: ABDOMEN - 1 VIEW COMPARISON:  Abdominal radiograph dated 11/24/2021. FINDINGS: Enteric tube with weighted tip to the right of the spine likely in the distal stomach or proximal duodenum. Partially visualized bilateral ureteral stents. Diffuse  interstitial coarsening and streaky and nodular densities. Left-sided PICC with tip at the cavoatrial junction. IMPRESSION: Enteric tube with tip in the distal stomach or proximal duodenum. Electronically Signed   By: Anner Crete M.D.   On: 11/24/2021 19:26   DG Chest Port 1 View  Result Date: 11/26/2021 CLINICAL DATA:  Sepsis.  Respiratory failure. EXAM: PORTABLE CHEST 1 VIEW COMPARISON:  11/23/2021 FINDINGS: Left subclavian central venous catheter remains in appropriate position. A new feeding tube is seen entering the stomach, although distal tip is not within the field of view. Heart size is stable. Diffuse bilateral pulmonary airspace disease shows no significant change. No pneumothorax visualized. IMPRESSION: New feeding tube is seen entering the stomach. No significant change in diffuse bilateral pulmonary airspace disease. Electronically Signed   By: Marlaine Hind M.D.   On: 11/26/2021 08:24      Assessment/Plan:  INTERVAL HISTORY: Patient now more confused and restrained has NG placed Principal Problem:   Septic shock ([place]) Active Problems:   Chronic pain   Bladder cancer ([place])   Bacteremia due to Gram-negative bacteria   UTI due to Klebsiella species   Acute kidney injury ([place])   Intra-abdominal abscess ([place])   S/P PICC central line placement   Fungemia   Swelling of joint of upper arm, right   Shock ([place])   Small bowel obstruction ([place])   Left arm swelling    WENDELIN BRADT is a 80 y.o. male with with bladder cancer with ileal conduit admitted initially for pyelonephritis due to Klebsiella oxytoca with bacteremia.  He separately developed Candida albicans fungemia in the setting of intra-abdominal and perinephric abscess that was drained on November 19.  #1 Candida albicans fungemia due to intra-abdominal abscess but also with PICC line that was in place while he was actively fungemic  PICC on the right side was placed on the 17th the day before his cultures turned  positive for Candida.  Note Candida are notorious for adhering to foreign bodies in particular plastic such as in a PICC line.  His repeat blood cultures from  19 November with his right sided PICC in place are continuing to grow yeast  Pete blood cultures that I sent 25th are without growth to date.   In terms of treating his fungemia it would be more ideal if he was without any PICC lines.  We could certainly treat him with oral fluconazole   I apologize that I was under the impression that he had not had exam by ophthalmology.  Greatly appreciate [name] Close having performed dilated funduscopic exam  #2 Klebsiella oxytoca bacteremia: He has received sufficient antibiotics for this.  3.  Intra-abdominal abscess he had been on Zosyn and Eraxis   Now eraxis alone  4.  Right sided DVT now on anticoagulation  5 left-sided pain near PICC line I ordered Doppler which now shows a superficial thrombus   6. Probiotic use: This patient was on Florastor which contains large quanties off S boulardii into his system and pose a SIGNIFICANT RISK OF CAUSING FUNGEMIA IN PATIENTS EXACTY SUCH AS HIM-- ICU PATIENTS WITH GI PATHOLOGY AND IMMUNOCOMPROMISE.   Dr. Baxter Flattery is back tomorrow.    LOS: 17 days   Alcide Evener 11/26/2021, 6:31 PM

## 2021-11-26 NOTE — Progress Notes (Signed)
PROGRESS NOTE    William [name]  NFA:[id number] DOB: [birth year] DOA: 11/13/2021 PCP: [name], MD   Brief Narrative:  80 year old with history of bladder cancer status post robotic assisted laparoscopic radical cystectomy/prostatectomy on 10/25/2021 by [name], bilateral pelvic lymph node dissection, extensive laparoscopic DCO lysis, small bowel resection with ileal conduit, urinary diversion on 10/26/2021, COPD, tobacco use initially discharged on 11/02/2021 but returned back to the hospital 11/21/2021 due to hypotension.  Patient was found to be in septic shock secondary to urinary tract infection from multiple species requiring vasopressors and was admitted to the ICU.  Eventually patient was transferred to ICU on 11/11/2021.  Hospital course was complicated by ileus/partial small bowel obstruction therefore general surgery was consulted.  This was conservatively managed.  Initially in renal function worsened but thereafter it slowly improved.  Repeat CT scan showed large intra-abdominal abscess 25.5 centimeters the cultures grew Candida albicans.  Patient was started on IV Diflucan, ID was Consulted and was switched to Eraxis.  IR drained intra-abdominal abscess on 11/19.  Also received PRBC transfusion during hospitalization.  Also found to have right upper extremity DVT and SVT therefore started on treatment with Lovenox.  Cortrak placed 11/25 to initiate tube feeding.   Assessment & Plan:   Principal Problem:   Septic shock ([place]) Active Problems:   Chronic pain   Bladder cancer ([place])   Bacteremia due to Gram-negative bacteria   UTI due to Klebsiella species   Acute kidney injury ([place])   Intra-abdominal abscess ([name])   S/P PICC central line placement   Fungemia   Swelling of joint of upper arm, right   Shock ([place])   Small bowel obstruction ([name])   Left arm swelling  Septic shock, sepsis criteria is improving, no longer on vasopressor, secondary to Klebsiella  oxytoca/Enterococcus faecalis UTI/Klebsiella oxytoca bacteremia, newly diagnosed Candida albicans fungemia Intra-abdominal abscess - Shock physiology-resolved. ID following.  Zosyn stopped 11/25 - Previous blood cultures 11/10-grew Klebsiella, urine cultures grew Enterococcus faecalis/Klebsiella - Blood cultures 11/18 grew Candida albicans.  Eraxis maybe Switched to fluconazole per ID?? -ophthalmology [name] Close on 11/19/2021= Negative. Outptn follow up.   Hypernatremia, HyperChloremia -BMP every 8 hours.  D5 water 50 cc/h, increase free water flushes to 50 mL every 4 hours.  Monitor urine output.  Medications reviewed.  Right upper extremity DVT and SVT Left upper extremity SVT - Lovenox 1 mg/kg started.  Initially patient was on right upper extremity now in the left upper extremity.  Ideally would like to to be removed but there is no order IV access.  Sinus tachycardia/A fib rvr suspect related to sepsis and ongoing lung physiology -Due to underlying Pulm issues, Deconditioning and intravascular vol dep. IV Lopressor prn. Increase to Metoprolol 37.5mg  PO BID. Replete Electrolytes. Echo (10/2021) - ef 55% g1DD.   Severe protein calorie malnutrition Hypoalbuminemia Hypernatremia due to clinical dehydration - Minimal oral intake.  Cortrak placed 11/25, tube feedings.  Continues to third spaced fluids.  Prealbumin <5  Mild acute respiratory distress with pulmonary edema Bilateral pleural effusion -Caution with IVF. ProCal and BNP repeated. CXR - read pending.   I-S/flutter valve.  Bronchodilators.  Out of bed to chair  Intra-abdominal abscess seen on CT scan measuring 25.5 cm  -Status post image guided drain placement LLQ by IR [name] on 11/18/2021.  IR to follow. - On IV Eraxis.  Zosyn stopped 11/25.  Small bowel obstruction/SBO -Initially noted on 11/10 on CT abdomen/pelvis, NG tube placed which was removed  on 11/15.  Cortrak placed again on 11/25 due to very poor oral intake.     Acute blood loss anemia; stable -Baseline hemoglobin 13.  Required multiple PRBC transfusion 1U 11/19; 2U 11/22.  No ongoing signs of blood loss   Intermittent delirium -qhs trazadone and SeroquelDelirium protocol    Nonoliguric acute kidney injury; resolved.  -Baseline creatinine around 0.8.  Peaked 2.58. Now resolved.    Oral thrush -Already on Eraxis, Magic mouthwash with lidocaine   Refractory hypokalemia post repletion -As needed repletion of electrolytes   Bladder cancer s/p radical cystectomy, prostatectomy and ileal conduit by urology [name] on 10/25/2021, and 10/26/2021. Underwent robotic assisted laparoscopic radical cystectomy, robotic radical prostatectomy, bilateral pelvic lymph node dissection, extensive laparoscopic adhesiolysis, small bowel resection and ileal conduit urinary diversion on 10/25/2021 and on 10/26/2021 by [name]. -Urology team following   COPD History of tobacco abuse -Continue Incruse, as needed bronchodilators, I-S/flutter.   Suspected ILD Noted on CT imaging this admission.  History of tobacco abuse. --Flonase --Outpatient pulmonology follow-up   Weakness/deconditioning/debility: PT/OT- HH  Overall patient is quite ill and remains full code.  Due to multiple ongoing medical issues and frailty, will consult palliative care service to help establish goals of care and discuss long-term feeding options especially if his oral intake remains very poor.    DVT prophylaxis: Lovenox Code Status: Full code Family Communication:   Status is: Inpatient  Remains inpatient appropriate because: Multiple ongoing issues as mentioned above, not ready for discharge.  Patient will need to be cleared by multiple services including ID, nephrology and urology prior to discharge  Nutritional status  Nutrition Problem: Inadequate oral intake Etiology: decreased appetite  Signs/Symptoms: per patient/family report  Interventions: Tube feeding,  Prostat  Body mass index is 26.24 kg/m.     Subjective: Sitting up in the bed, not in acute distress but everytime he tries to move around HR increase to 120s, in A fib.   Review of Systems Otherwise negative except as per HPI, including: General = no fevers, chills, dizziness,  fatigue HEENT/EYES = negative for loss of vision, double vision, blurred vision,  sore throa Cardiovascular= negative for chest pain, palpitation Respiratory/lungs= negative for shortness of breath, cough, wheezing; hemoptysis,  Gastrointestinal= negative for nausea, vomiting, abdominal pain Genitourinary= negative for Dysuria MSK = Negative for arthralgia, myalgias Neurology= Negative for headache, numbness, tingling  Psychiatry= Negative for suicidal and homocidal ideation Skin= Negative for Rash   Examination: Constitutional: Not in acute distress; [place] frail Ill appearing.  Respiratory: Minimal b/l rhonchi.  Cardiovascular: Tachycardia, Irregularly irregular.  Abdomen: Nontender nondistended good bowel sounds Musculoskeletal: b/l UE edema.  Skin: No rashes seen Neurologic: Groslly moving all extremiites.  Psychiatric: Alert to his name only.   PICC line in place; LUE Drain in place Foley catheter in place Cortrak In place.   Objective: Vitals:   11/26/21 0000 11/26/21 0400 11/26/21 0550 11/26/21 0600  BP: (!) 147/72 137/71    Pulse: 100 (!) 119    Resp: (!) 23 (!) 24    Temp: 97.7 F (36.5 C)  97.7 F (36.5 C)   TempSrc: Axillary  Axillary   SpO2: 95% 93%    Weight:    76 kg  Height:        Intake/Output Summary (Last 24 hours) at 11/26/2021 0731 Last data filed at 11/26/2021 0617 Gross per 24 hour  Intake 1880.02 ml  Output 5825 ml  Net -3944.98 ml   Filed Weights   11/24/21  0500 11/25/21 0433 11/26/21 0600  Weight: 82.8 kg 79.7 kg 76 kg     Data Reviewed:   CBC: Recent Labs  Lab 11/21/21 0515 11/21/21 1955 11/22/21 0500 11/23/21 0400 11/24/21 0549  11/25/21 0427 11/26/21 0538  WBC 8.4   < > 9.2 8.3 10.2 9.2 9.7  NEUTROABS 6.4  --  7.3 6.3 7.8* 6.4  --   HGB 7.7*   < > 9.7* 9.0* 9.3* 9.9* 10.3*  HCT 24.4*   < > 29.7* 27.7* 29.4* 32.0* 33.3*  MCV 92.1   < > 90.8 91.1 92.2 93.8 95.1  PLT 315   < > 308 315 375 390 355   < > = values in this interval not displayed.   Basic Metabolic Panel: Recent Labs  Lab 11/21/21 0515 11/22/21 0500 11/23/21 0400 11/24/21 0549 11/24/21 1606 11/25/21 0427 11/25/21 1700 11/26/21 0538  NA 136  136   < > 141 146* 149* 151* 154* 159*  K 3.3*  3.2*   < > 3.2* 2.8* 3.1* 2.6* 3.6 3.3*  CL 107  106   < > 118* 116* 117* 116* 124* 129*  CO2 22  22   < > 20* 23 22 26 27 27   GLUCOSE 89  89   < > 89 106* 107* 102* 157* 143*  BUN 26*  27*   < > 17 10 10 12 12 14   CREATININE 2.45*  2.48*   < > 1.41* 0.88 0.82 0.83 0.76 0.83  CALCIUM 7.2*  7.1*   < > 7.7* 8.2* 8.6* 8.6* 8.7* 9.0  MG 1.9  --  2.2 1.9 1.8 1.9 1.9  --   PHOS 3.5  --  2.7  --  2.2* 2.6 1.9*  --    < > = values in this interval not displayed.   GFR: Estimated Creatinine Clearance: 67.5 mL/min (by C-G formula based on SCr of 0.83 mg/dL). Liver Function Tests: Recent Labs  Lab 11/20/21 0540 11/21/21 0515 11/22/21 0500 11/23/21 0400  AST 26 27 26 19   ALT 16 16 15 12   ALKPHOS 78 67 83 71  BILITOT 0.7 0.7 1.0 1.0  PROT 5.7* 5.2* 5.5* 5.8*  ALBUMIN 1.6* 1.5*  <1.5* <1.5* 2.3*   No results for input(s): LIPASE, AMYLASE in the last 168 hours.  No results for input(s): AMMONIA in the last 168 hours. Coagulation Profile: No results for input(s): INR, PROTIME in the last 168 hours.  Cardiac Enzymes: No results for input(s): CKTOTAL, CKMB, CKMBINDEX, TROPONINI in the last 168 hours. BNP (last 3 results) No results for input(s): PROBNP in the last 8760 hours. HbA1C: No results for input(s): HGBA1C in the last 72 hours. CBG: Recent Labs  Lab 11/25/21 1633 11/25/21 1947 11/25/21 2319 11/26/21 0058 11/26/21 0544  GLUCAP  124* 119* 131* 162* 133*   Lipid Profile: No results for input(s): CHOL, HDL, LDLCALC, TRIG, CHOLHDL, LDLDIRECT in the last 72 hours.  Thyroid Function Tests: No results for input(s): TSH, T4TOTAL, FREET4, T3FREE, THYROIDAB in the last 72 hours.  Anemia Panel: No results for input(s): VITAMINB12, FOLATE, FERRITIN, TIBC, IRON, RETICCTPCT in the last 72 hours. Sepsis Labs: No results for input(s): PROCALCITON, LATICACIDVEN in the last 168 hours.   Recent Results (from the past 240 hour(s))  Culture, blood (routine x 2)     Status: Abnormal   Collection Time: 11/17/21  5:10 AM   Specimen: BLOOD  Result Value Ref Range Status   Specimen Description   Final    BLOOD  BLOOD LEFT HAND Performed at [facility] [address], [place], [place] [address]    Special Requests   Final    BOTTLES DRAWN AEROBIC ONLY Blood Culture adequate volume Performed at [facility] [address] [place], [place] [address]    Culture  Setup Time   Final    YEAST AEROBIC BOTTLE ONLY CRITICAL VALUE NOTED.  VALUE IS CONSISTENT WITH PREVIOUSLY REPORTED AND CALLED VALUE.    Culture (A)  Final    CANDIDA ALBICANS SUSCEPTIBILITIES PERFORMED ON PREVIOUS CULTURE WITHIN THE LAST 5 DAYS. Performed at [facility], [place] [address], [place], [place] [address]    Report Status 11/23/2021 FINAL  Final  Culture, blood (routine x 2)     Status: Abnormal   Collection Time: 11/17/21  5:20 AM   Specimen: BLOOD  Result Value Ref Range Status   Specimen Description   Final    BLOOD BLOOD LEFT WRIST Performed at [facility] [address], [place], [place] [address]    Special Requests   Final    BOTTLES DRAWN AEROBIC ONLY Blood Culture adequate volume Performed at [facility] [address], [place], [place] [address]    Culture  Setup Time   Final    YEAST AEROBIC BOTTLE ONLY CRITICAL RESULT CALLED TO, READ BACK BY AND  VERIFIED WITH: A PHAM PHARMD @0821  11/18/21 EB    Culture (A)  Final    CANDIDA ALBICANS SEE SEPARATE REPORT Performed at [facility], [address] [place],  [address]    Report Status 11/23/2021 FINAL  Final  Blood Culture ID Panel (Reflexed)     Status: Abnormal   Collection Time: 11/17/21  5:20 AM  Result Value Ref Range Status   Enterococcus faecalis NOT DETECTED NOT DETECTED Final   Enterococcus Faecium NOT DETECTED NOT DETECTED Final   Listeria monocytogenes NOT DETECTED NOT DETECTED Final   Staphylococcus species NOT DETECTED NOT DETECTED Final   Staphylococcus aureus (BCID) NOT DETECTED NOT DETECTED Final   Staphylococcus epidermidis NOT DETECTED NOT DETECTED Final   Staphylococcus lugdunensis NOT DETECTED NOT DETECTED Final   Streptococcus species NOT DETECTED NOT DETECTED Final   Streptococcus agalactiae NOT DETECTED NOT DETECTED Final   Streptococcus pneumoniae NOT DETECTED NOT DETECTED Final   Streptococcus pyogenes NOT DETECTED NOT DETECTED Final   A.calcoaceticus-baumannii NOT DETECTED NOT DETECTED Final   Bacteroides fragilis NOT DETECTED NOT DETECTED Final   Enterobacterales NOT DETECTED NOT DETECTED Final   Enterobacter cloacae complex NOT DETECTED NOT DETECTED Final   Escherichia coli NOT DETECTED NOT DETECTED Final   Klebsiella aerogenes NOT DETECTED NOT DETECTED Final   Klebsiella oxytoca NOT DETECTED NOT DETECTED Final   Klebsiella pneumoniae NOT DETECTED NOT DETECTED Final   Proteus species NOT DETECTED NOT DETECTED Final   Salmonella species NOT DETECTED NOT DETECTED Final   Serratia marcescens NOT DETECTED NOT DETECTED Final   Haemophilus influenzae NOT DETECTED NOT DETECTED Final   Neisseria meningitidis NOT DETECTED NOT DETECTED Final   Pseudomonas aeruginosa NOT DETECTED NOT DETECTED Final   Stenotrophomonas maltophilia NOT DETECTED NOT DETECTED Final   Candida albicans DETECTED (A) NOT DETECTED Final    Comment: CRITICAL RESULT  CALLED TO, READ BACK BY AND VERIFIED WITH: A PHAM PHARMD @0821  11/18/21 EB    Candida auris NOT DETECTED NOT DETECTED Final   Candida glabrata NOT DETECTED NOT DETECTED Final   Candida krusei NOT DETECTED NOT DETECTED Final  Candida parapsilosis NOT DETECTED NOT DETECTED Final   Candida tropicalis NOT DETECTED NOT DETECTED Final   Cryptococcus neoformans/gattii NOT DETECTED NOT DETECTED Final    Comment: Performed at [facility], [address] [place], [place] [address]  Antifungal AST 9 Drug Panel     Status: None   Collection Time: 11/17/21  5:20 AM  Result Value Ref Range Status   Organism ID, Yeast Candida albicans  Corrected    Comment: (NOTE) Identification performed by account, not confirmed by this laboratory. CORRECTED ON 11/24 AT 1336: PREVIOUSLY REPORTED AS Preliminary report    Amphotericin B MIC 0.5 ug/mL  Final    Comment: (NOTE) Breakpoints have been established for only some organism-drug combinations as indicated. This test was developed and its performance characteristics determined by [facility]. It has not been cleared or approved by the Food and Drug Administration.    Anidulafungin MIC Comment  Final    Comment: (NOTE) 0.016 ug/mL Susceptible Breakpoints have been established for only some organism-drug combinations as indicated. This test was developed and its performance characteristics determined by [facility]. It has not been cleared or approved by the Food and Drug Administration.    Caspofungin MIC Comment  Final    Comment: (NOTE) 0.016 ug/mL Susceptible Breakpoints have been established for only some organism-drug combinations as indicated. This test was developed and its performance characteristics determined by [facility]. It has not been cleared or approved by the Food and Drug Administration.    Micafungin MIC Comment  Final    Comment: (NOTE) 0.016 ug/mL Susceptible Breakpoints have been established for only some  organism-drug combinations as indicated. This test was developed and its performance characteristics determined by [facility]. It has not been cleared or approved by the Food and Drug Administration.    Posaconazole MIC 0.03 ug/mL  Final    Comment: (NOTE) Breakpoints have been established for only some organism-drug combinations as indicated. This test was developed and its performance characteristics determined by [facility]. It has not been cleared or approved by the Food and Drug Administration.    Fluconazole Islt MIC 0.5 ug/mL Susceptible  Final    Comment: (NOTE) Breakpoints have been established for only some organism-drug combinations as indicated. This test was developed and its performance characteristics determined by [facility]. It has not been cleared or approved by the Food and Drug Administration.    Flucytosine MIC 1.0 ug/mL  Final    Comment: (NOTE) Breakpoints have been established for only some organism-drug combinations as indicated. This test was developed and its performance characteristics determined by [facility]. It has not been cleared or approved by the Food and Drug Administration.    Itraconazole MIC 0.12 ug/mL  Final    Comment: (NOTE) Breakpoints have been established for only some organism-drug combinations as indicated. This test was developed and its performance characteristics determined by [facility]. It has not been cleared or approved by the Food and Drug Administration.    Voriconazole MIC Comment  Final    Comment: (NOTE) 0.016 ug/mL Susceptible Breakpoints have been established for only some organism-drug combinations as indicated. This test was developed and its performance characteristics determined by [facility]. It has not been cleared or approved by the Food and Drug Administration. Performed At: [facility] [place], [place] [id number] Rush Farmer MD XB:[id number]    Source [phone] SORUCE BLD CBenjamine Sprague SENSI   Final    Comment: Performed at [facility], [place] [address], [place], [place] [address]  MRSA Next Gen by PCR, Nasal     Status: None   Collection Time: 11/17/21  6:24 PM   Specimen: Nasal Mucosa; Nasal Swab  Result Value Ref Range Status   MRSA by PCR Next Gen NOT DETECTED NOT DETECTED Final    Comment: (NOTE) The GeneXpert MRSA Assay (FDA approved for NASAL specimens only), is one component of a comprehensive MRSA colonization surveillance program. It is not intended to diagnose MRSA infection nor to guide or monitor treatment for MRSA infections. Test performance is not FDA approved in patients less than 37 years old. Performed at [facility], [place] [address], [place], [place] [address]   C Difficile Quick Screen (NO PCR Reflex)     Status: None   Collection Time: 11/17/21  8:40 PM   Specimen: STOOL  Result Value Ref Range Status   C Diff antigen NEGATIVE NEGATIVE Final   C Diff toxin NEGATIVE NEGATIVE Final   C Diff interpretation NEGATIVE  Final    Comment: Performed at [facility], [place] [address], [place], [place] [address]  Aerobic/Anaerobic Culture w Gram Stain (surgical/deep wound)     Status: None   Collection Time: 11/18/21 11:01 AM   Specimen: Abdomen; Abscess  Result Value Ref Range Status   Specimen Description   Final    ABDOMEN Performed at [facility] [address] [place], [place] [address]    Special Requests   Final    ABSCES Performed at [facility], [place] [address] [place], [place] [address]    Gram Stain   Final    MODERATE WBC PRESENT,BOTH PMN AND MONONUCLEAR NO ORGANISMS SEEN    Culture   Final    RARE ESCHERICHIA COLI RARE CANDIDA ALBICANS NO ANAEROBES ISOLATED Performed at [facility], [address], [place], [place] [address]    Report Status 11/23/2021 FINAL  Final   Organism ID, Bacteria ESCHERICHIA COLI  Final      Susceptibility   Escherichia  coli - MIC*    AMPICILLIN <=2 SENSITIVE Sensitive     CEFAZOLIN <=4 SENSITIVE Sensitive     CEFEPIME <=0.12 SENSITIVE Sensitive     CEFTAZIDIME <=1 SENSITIVE Sensitive     CEFTRIAXONE <=0.25 SENSITIVE Sensitive     CIPROFLOXACIN <=0.25 SENSITIVE Sensitive     GENTAMICIN <=1 SENSITIVE Sensitive     IMIPENEM <=0.25 SENSITIVE Sensitive     TRIMETH/SULFA <=20 SENSITIVE Sensitive     AMPICILLIN/SULBACTAM <=2 SENSITIVE Sensitive     PIP/TAZO <=4 SENSITIVE Sensitive     * RARE ESCHERICHIA COLI  Fungus Stain     Status: None   Collection Time: 11/18/21 11:01 AM   Specimen: Abscess  Result Value Ref Range Status   FUNGUS STAIN Final report  Final    Comment: (NOTE) Performed At: [facility] [address] [place], [place] [id number] Rush Farmer MD [id number]    Fungal Source ABSCESS  Final    Comment: Performed at [facility], [place] [address], [place], [place] [address]  Fungal Stain reflex     Status: None   Collection Time: 11/18/21 11:01 AM  Result Value Ref Range Status   Fungal stain result 1 Comment  Final    Comment: (NOTE) KOH/Calcofluor preparation:  no fungus observed. Performed At: [facility] [place], [place] [id number] Rush Farmer MD [id number]   Culture, fungus without smear     Status: Abnormal (Preliminary result)   Collection  Time: 11/18/21 11:02 AM   Specimen: Abscess; Other  Result Value Ref Range Status   Specimen Description   Final    ABSCESS Performed at [facility] [address], [place], [place] [address]    Special Requests ABDOMEN  Final   Culture (A)  Final    CANDIDA ALBICANS CONTINUING TO HOLD Performed at [facility], [place] [address], [place], [place] [address]    Report Status PENDING  Incomplete  Urine Culture     Status: Abnormal   Collection Time: 11/18/21  3:03 PM   Specimen: Urine, Clean Catch  Result Value Ref Range Status   Specimen Description   Final     URINE, CLEAN CATCH Performed at [facility], [place] [address], [place], [place] [address]    Special Requests   Final    NONE Performed at [facility], [place] [address] [place], [place] [address]    Culture (A)  Final    <10,000 COLONIES/mL INSIGNIFICANT GROWTH Performed at [facility] [address], [place], [place] [address]    Report Status 11/20/2021 FINAL  Final  Culture, blood (routine x 2)     Status: Abnormal   Collection Time: 11/18/21  7:27 PM   Specimen: BLOOD  Result Value Ref Range Status   Specimen Description   Final    BLOOD BLOOD LEFT WRIST Performed at [facility] [address] [place], [place] [address]    Special Requests   Final    BOTTLES DRAWN AEROBIC ONLY Blood Culture adequate volume Performed at [facility] [address] [place], [place] [address]    Culture  Setup Time   Final    YEAST AEROBIC BOTTLE ONLY CRITICAL VALUE NOTED.  VALUE IS CONSISTENT WITH PREVIOUSLY REPORTED AND CALLED VALUE.    Culture (A)  Final    CANDIDA ALBICANS SUSCEPTIBILITIES PERFORMED ON PREVIOUS CULTURE WITHIN THE LAST 5 DAYS. Performed at [facility], [place] [address], [place], [place] [address]    Report Status 11/25/2021 FINAL  Final  Fungus culture, blood     Status: Abnormal (Preliminary result)   Collection Time: 11/18/21  7:28 PM   Specimen: BLOOD  Result Value Ref Range Status   Specimen Description   Final    BLOOD BLOOD LEFT HAND Performed at [facility] [address], [place], [place] [address]    Special Requests   Final    BOTTLES DRAWN AEROBIC ONLY Blood Culture adequate volume Performed at [facility] [address], [place], [place] [address]    Fungal Smear   Final    YEAST AEROBIC BOTTLE ONLY CRITICAL VALUE NOTED.  VALUE IS CONSISTENT WITH PREVIOUSLY REPORTED AND CALLED VALUE.    Culture (A)  Final    CANDIDA  ALBICANS SUSCEPTIBILITIES PERFORMED ON PREVIOUS CULTURE WITHIN THE LAST 5 DAYS. Performed at [facility], [place] [address], [place], [place] [address]    Report Status PENDING  Incomplete  Culture, blood (routine x 2)     Status: None   Collection Time: 11/18/21  7:28 PM   Specimen: BLOOD  Result Value Ref Range Status   Specimen Description   Final    BLOOD BLOOD LEFT HAND Performed at [facility] [address] [place], [place] [address]    Special Requests   Final    BOTTLES DRAWN AEROBIC ONLY Blood Culture adequate volume Performed  at [facility], [place] [address] [place], [place] [address]    Culture   Final    NO GROWTH 5 DAYS Performed at [facility], [place] [address] [place], [place] [address]    Report Status 11/24/2021 FINAL  Final  Culture, blood (Routine X 2) w Reflex to ID Panel     Status: None (Preliminary result)   Collection Time: 11/24/21  4:06 PM   Specimen: BLOOD  Result Value Ref Range Status   Specimen Description   Final    BLOOD BLOOD RIGHT HAND Performed at [facility] [address], [place], [place] [address]    Special Requests   Final    BOTTLES DRAWN AEROBIC ONLY Blood Culture results may not be optimal due to an inadequate volume of blood received in culture bottles Performed at [facility] [address] [place], [place] [address]    Culture   Final    NO GROWTH < 24 HOURS Performed at [facility] [address] [place], [place] [address]    Report Status PENDING  Incomplete  Culture, blood (Routine X 2) w Reflex to ID Panel     Status: None (Preliminary result)   Collection Time: 11/24/21  4:06 PM   Specimen: BLOOD  Result Value Ref Range Status   Specimen Description   Final    BLOOD BLOOD RIGHT WRIST Performed at [facility] [address], [place], [place] [address]    Special Requests   Final    BOTTLES DRAWN  AEROBIC ONLY Blood Culture adequate volume Performed at [facility] [address], [place], [place] [address]    Culture   Final    NO GROWTH < 24 HOURS Performed at [facility] [address] [place], [place] [address]    Report Status PENDING  Incomplete         Radiology Studies: DG Abd 1 View  Result Date: 11/24/2021 CLINICAL DATA:  NG placement. EXAM: ABDOMEN - 1 VIEW COMPARISON:  Abdominal radiograph dated 11/24/2021. FINDINGS: Enteric tube with weighted tip to the right of the spine likely in the distal stomach or proximal duodenum. Partially visualized bilateral ureteral stents. Diffuse interstitial coarsening and streaky and nodular densities. Left-sided PICC with tip at the cavoatrial junction. IMPRESSION: Enteric tube with tip in the distal stomach or proximal duodenum. Electronically Signed   By: Anner Crete M.D.   On: 11/24/2021 19:26   DG Abd 1 View  Result Date: 11/24/2021 CLINICAL DATA:  80 year old male status post feeding tube placement. EXAM: ABDOMEN - 1 VIEW COMPARISON:  11/16/2021 FINDINGS: Weighted tip enteric feeding tube distal tip is at the level of the pylorus/first portion of the duodenum. Nonspecific bowel gas pattern. Similar appearance of indwelling bilateral nephroureteral stents. Partially visualized lumbar spinal fusion hardware. IMPRESSION: Enteric feeding tube weighted tip is located at the level of the pylorus/first portion of the duodenum. Electronically Signed   By: Ruthann Cancer M.D.   On: 11/24/2021 13:35   VAS [place] UPPER EXTREMITY VENOUS DUPLEX  Result Date: 11/25/2021 UPPER VENOUS STUDY  Patient Name:  William [name]  Date of Exam:   11/24/2021 Medical Rec #: [id number]       Accession #:    [id number] Date of Birth: 1941-02-06       Patient Gender: M Patient Age:   71 years Exam Location:  [facility] Procedure:  VAS [place] UPPER EXTREMITY VENOUS DUPLEX Referring Phys: CORNELIUS VAN DAM  --------------------------------------------------------------------------------  Indications: Swelling Risk Factors: PICC line placement. Comparison Study: Previous BUEV on 11/14/21 was negative for DVT. Performing Technologist: Charmayne Sheer  Examination Guidelines: A complete evaluation includes B-mode imaging, spectral Doppler, color Doppler, and power Doppler as needed of all accessible portions of each vessel. Bilateral testing is considered an integral part of a complete examination. Limited examinations for reoccurring indications may be performed as noted.  Right Findings: +----------+------------+---------+-----------+----------+-------+ RIGHT     CompressiblePhasicitySpontaneousPropertiesSummary +----------+------------+---------+-----------+----------+-------+ Subclavian    Full       Yes       Yes                      +----------+------------+---------+-----------+----------+-------+  Left Findings: +----------+------------+---------+-----------+----------+-------+ LEFT      CompressiblePhasicitySpontaneousPropertiesSummary +----------+------------+---------+-----------+----------+-------+ IJV           Full       Yes       Yes                      +----------+------------+---------+-----------+----------+-------+ Subclavian    Full       Yes       Yes                      +----------+------------+---------+-----------+----------+-------+ Axillary      Full       Yes       Yes                      +----------+------------+---------+-----------+----------+-------+ Brachial      Full       Yes       Yes                      +----------+------------+---------+-----------+----------+-------+ Radial        Full                                          +----------+------------+---------+-----------+----------+-------+ Ulnar         Full                                          +----------+------------+---------+-----------+----------+-------+  Cephalic      None       No        No                Acute  +----------+------------+---------+-----------+----------+-------+ Basilic       Full       Yes       Yes                      +----------+------------+---------+-----------+----------+-------+ Visualization of the brachial and basilic veins were limited due to PICC line placement.  Summary:  Right: No evidence of thrombosis in the subclavian.  Left: No evidence of deep vein thrombosis in the upper extremity. Findings consistent with acute superficial vein thrombosis involving the left cephalic vein.  *See table(s) above for measurements and observations.  Diagnosing physician: Orlie Pollen Electronically signed by Orlie Pollen on 11/25/2021 at 10:44:13 AM.    Final         Scheduled Meds:  chlorhexidine  15 mL Mouth  Rinse BID   Chlorhexidine Gluconate Cloth  6 each Topical Daily   enoxaparin (LOVENOX) injection  80 mg Subcutaneous Q12H   feeding supplement (PROSource TF)  45 mL Per Tube BID   fluticasone  2 spray Each Nare Daily   free water  50 mL Per Tube Q4H   insulin aspart  0-9 Units Subcutaneous Q6H   lip balm  1 application Topical BID   mouth rinse  15 mL Mouth Rinse q12n4p   metoprolol tartrate  25 mg Per Tube BID   multivitamin  15 mL Per Tube Daily   nystatin  5 mL Oral QID   nystatin   Topical BID   potassium chloride  40 mEq Per Tube Q4H   QUEtiapine  12.5 mg Per Tube QHS   saccharomyces boulardii  250 mg Per Tube BID   sodium chloride flush  10-40 mL Intracatheter Q12H   sodium chloride flush  10-40 mL Intracatheter Q12H   sodium chloride flush  5 mL Intracatheter Q8H   thiamine injection  100 mg Intravenous Daily   traZODone  50 mg Per Tube QHS   umeclidinium bromide  1 puff Inhalation Daily   Continuous Infusions:  sodium chloride Stopped (11/23/21 1558)   sodium chloride Stopped (11/21/21 1838)   anidulafungin Stopped (11/25/21 1154)   chlorproMAZINE (THORAZINE) IV Stopped (11/22/21 0414)    dextrose     feeding supplement (OSMOLITE 1.5 CAL) 35 mL/hr at 11/25/21 1408   methocarbamol (ROBAXIN) IV Stopped (11/20/21 2252)   ondansetron (ZOFRAN) IV       LOS: 17 days   Time spent= 35 mins    Narciso Stoutenburg Arsenio Loader, MD [facility]  If 7PM-7AM, please contact night-coverage  11/26/2021, 7:31 AM

## 2021-11-27 DIAGNOSIS — R6521 Severe sepsis with septic shock: Secondary | ICD-10-CM | POA: Diagnosis not present

## 2021-11-27 DIAGNOSIS — A419 Sepsis, unspecified organism: Secondary | ICD-10-CM | POA: Diagnosis not present

## 2021-11-27 LAB — BASIC METABOLIC PANEL
Anion gap: 4 — ABNORMAL LOW (ref 5–15)
Anion gap: 5 (ref 5–15)
BUN: 17 mg/dL (ref 8–23)
BUN: 19 mg/dL (ref 8–23)
CO2: 29 mmol/L (ref 22–32)
CO2: 30 mmol/L (ref 22–32)
Calcium: 8.6 mg/dL — ABNORMAL LOW (ref 8.9–10.3)
Calcium: 8.7 mg/dL — ABNORMAL LOW (ref 8.9–10.3)
Chloride: 123 mmol/L — ABNORMAL HIGH (ref 98–111)
Chloride: 126 mmol/L — ABNORMAL HIGH (ref 98–111)
Creatinine, Ser: 0.87 mg/dL (ref 0.61–1.24)
Creatinine, Ser: 0.89 mg/dL (ref 0.61–1.24)
GFR, Estimated: >60 mL/min (ref >60)
GFR, Estimated: >60 mL/min (ref >60)
Glucose, Bld: 132 mg/dL — ABNORMAL HIGH (ref 70–99)
Glucose, Bld: 147 mg/dL — ABNORMAL HIGH (ref 70–99)
Potassium: 3.3 mmol/L — ABNORMAL LOW (ref 3.5–5.1)
Potassium: 3.3 mmol/L — ABNORMAL LOW (ref 3.5–5.1)
Sodium: 158 mmol/L — ABNORMAL HIGH (ref 135–145)
Sodium: 159 mmol/L — ABNORMAL HIGH (ref 135–145)

## 2021-11-27 LAB — CBC
HCT: 34.1 % — ABNORMAL LOW (ref 39.0–52.0)
Hemoglobin: 10.1 g/dL — ABNORMAL LOW (ref 13.0–17.0)
MCH: 28.9 pg (ref 26.0–34.0)
MCHC: 29.6 g/dL — ABNORMAL LOW (ref 30.0–36.0)
MCV: 97.4 fL (ref 80.0–100.0)
Platelets: 408 10*3/uL — ABNORMAL HIGH (ref 150–400)
RBC: 3.5 MIL/uL — ABNORMAL LOW (ref 4.22–5.81)
RDW: 15.9 % — ABNORMAL HIGH (ref 11.5–15.5)
WBC: 11 10*3/uL — ABNORMAL HIGH (ref 4.0–10.5)
nRBC: 0 % (ref 0.0–0.2)

## 2021-11-27 LAB — COMPREHENSIVE METABOLIC PANEL
ALT: 18 U/L (ref 0–44)
AST: 30 U/L (ref 15–41)
Albumin: 2.2 g/dL — ABNORMAL LOW (ref 3.5–5.0)
Alkaline Phosphatase: 77 U/L (ref 38–126)
Anion gap: 6 (ref 5–15)
BUN: 16 mg/dL (ref 8–23)
CO2: 29 mmol/L (ref 22–32)
Calcium: 8.8 mg/dL — ABNORMAL LOW (ref 8.9–10.3)
Chloride: 123 mmol/L — ABNORMAL HIGH (ref 98–111)
Creatinine, Ser: 0.87 mg/dL (ref 0.61–1.24)
GFR, Estimated: >60 mL/min (ref >60)
Glucose, Bld: 140 mg/dL — ABNORMAL HIGH (ref 70–99)
Potassium: 3.1 mmol/L — ABNORMAL LOW (ref 3.5–5.1)
Sodium: 158 mmol/L — ABNORMAL HIGH (ref 135–145)
Total Bilirubin: 0.5 mg/dL (ref 0.3–1.2)
Total Protein: 6.9 g/dL (ref 6.5–8.1)

## 2021-11-27 LAB — BRAIN NATRIURETIC PEPTIDE: B Natriuretic Peptide: 179.1 pg/mL — ABNORMAL HIGH (ref 0.0–100.0)

## 2021-11-27 LAB — GLUCOSE, CAPILLARY
Glucose-Capillary: 115 mg/dL — ABNORMAL HIGH (ref 70–99)
Glucose-Capillary: 132 mg/dL — ABNORMAL HIGH (ref 70–99)
Glucose-Capillary: 134 mg/dL — ABNORMAL HIGH (ref 70–99)
Glucose-Capillary: 134 mg/dL — ABNORMAL HIGH (ref 70–99)
Glucose-Capillary: 139 mg/dL — ABNORMAL HIGH (ref 70–99)
Glucose-Capillary: 145 mg/dL — ABNORMAL HIGH (ref 70–99)

## 2021-11-27 LAB — MAGNESIUM: Magnesium: 2.2 mg/dL (ref 1.7–2.4)

## 2021-11-27 LAB — PHOSPHORUS: Phosphorus: 3.5 mg/dL (ref 2.5–4.6)

## 2021-11-27 LAB — CREATININE, FLUID (PLEURAL, PERITONEAL, JP DRAINAGE): Creat, Fluid: 0.7 mg/dL

## 2021-11-27 LAB — TRIGLYCERIDES: Triglycerides: 136 mg/dL (ref <150)

## 2021-11-27 MED ORDER — HYDROMORPHONE HCL 1 MG/ML IJ SOLN
0.5000 mg | Freq: Once | INTRAMUSCULAR | Status: AC
  Administered 2021-11-27: 04:00:00 0.5 mg via INTRAVENOUS

## 2021-11-27 MED ORDER — DEXTROSE 5 % IV SOLN: INTRAVENOUS | Status: DC

## 2021-11-27 MED ORDER — HYDROMORPHONE HCL 1 MG/ML IJ SOLN
0.5000 mg | INTRAMUSCULAR | Status: DC | PRN
Start: 2021-11-27 — End: 2021-11-30
  Administered 2021-11-27 – 2021-11-29 (×14): 1 mg via INTRAVENOUS
  Filled 2021-11-27 (×14): qty 1

## 2021-11-27 NOTE — Progress Notes (Signed)
PROGRESS NOTE    William [name]  OAC:[id number] DOB: [birth year] DOA: 11/21/2021 PCP: [name], MD   Brief Narrative:  80 year old with history of bladder cancer status post robotic assisted laparoscopic radical cystectomy/prostatectomy on 10/25/2021 by [name], bilateral pelvic lymph node dissection, extensive laparoscopic DCO lysis, small bowel resection with ileal conduit, urinary diversion on 10/26/2021, COPD, tobacco use initially discharged on 11/02/2021 but returned back to the hospital 11/08/2021 due to hypotension.  Patient was found to be in septic shock secondary to urinary tract infection from multiple species requiring vasopressors and was admitted to the ICU.  Eventually patient was transferred to ICU on 11/11/2021.  Hospital course was complicated by ileus/partial small bowel obstruction therefore general surgery was consulted.  This was conservatively managed.  Initially in renal function worsened but thereafter it slowly improved.  Repeat CT scan showed large intra-abdominal abscess 25.5 centimeters the cultures grew Candida albicans.  Patient was started on IV Diflucan, ID was Consulted and was switched to Eraxis.  IR drained intra-abdominal abscess on 11/19.  Also received PRBC transfusion during hospitalization.  Also found to have right upper extremity DVT and SVT therefore started on treatment with Lovenox.  Cortrak placed 11/25 to initiate tube feeding.   Assessment & Plan:   Principal Problem:   Septic shock ([place]) Active Problems:   Chronic pain   Bladder cancer ([place])   Bacteremia due to Gram-negative bacteria   UTI due to Klebsiella species   Acute kidney injury ([place])   Intra-abdominal abscess ([name])   S/P PICC central line placement   Fungemia   Swelling of joint of upper arm, right   Shock ([place])   Small bowel obstruction ([name])   Left arm swelling   Encounter for central line placement   Encounter for feeding tube placement   Encounter for imaging  study to confirm nasogastric (NG) tube placement   Hypoxia   Nasogastric tube present   SBO (small bowel obstruction) ([name])   Candidemia ([name])  Septic shock, sepsis criteria is improving, no longer on vasopressor, secondary to Klebsiella oxytoca/Enterococcus faecalis UTI/Klebsiella oxytoca bacteremia, newly diagnosed Candida albicans fungemia Intra-abdominal abscess - Shock physiology-resolved. ID following.  Zosyn stopped 11/25 - Previous blood cultures 11/10-grew Klebsiella, urine cultures grew Enterococcus faecalis/Klebsiella - Blood cultures 11/18 grew Candida albicans. On IV Eraxis, can be swtiched to PO -ophthalmology [name] Close on 11/19/2021= Negative. Outptn follow up.   Hypernatremia, HyperChloremia -Periodic BMP..  Cont D5W, Free water flushes.   Right upper extremity DVT and SVT Left upper extremity SVT - Lovenox 1 mg/kg started.  Initially patient was on right upper extremity now in the left upper extremity.  Ideally would like to to be removed but there is no order IV access.  Sinus tachycardia/A fib rvr suspect related to sepsis and ongoing lung physiology -Due to underlying Pulm issues, Deconditioning and intravascular vol dep. IV Lopressor prn. Po Metoprolol 37.68m bid. Replete Electrolytes. Echo (10/2021) - ef 55% g1DD.   Severe protein calorie malnutrition Hypoalbuminemia Hypernatremia due to clinical dehydration - Minimal oral intake.  Cortrak placed 11/25, tube feedings.  Continues to third spaced fluids.  Prealbumin <5  Mild acute respiratory distress with pulmonary edema Bilateral pleural effusion -Caution with IVF. ProCal and BNP repeated. CXR - read pending.   I-S/flutter valve.  Bronchodilators.  Out of bed to chair  Intra-abdominal abscess seen on CT scan measuring 25.5 cm  -Status post image guided drain placement LLQ by IR [name] on 11/18/2021.  IR to  follow. - On IV Eraxis.  Zosyn stopped 11/25.  Small bowel obstruction/SBO -Initially noted on  11/10 on CT abdomen/pelvis, NG tube placed which was removed on 11/15.  Cortak in place since 11/25   Acute blood loss anemia; stable -Baseline hemoglobin 13.  Required multiple PRBC transfusion 1U 11/19; 2U 11/22.  No gross blood loss.    Intermittent delirium -qhs trazadone and SeroquelDelirium protocol    Nonoliguric acute kidney injury; resolved.  -Baseline creatinine around 0.8.  Peaked 2.58.    Oral thrush -Already on Eraxis, Magic mouthwash with lidocaine   Refractory hypokalemia post repletion -As needed repletion of electrolytes   Bladder cancer s/p radical cystectomy, prostatectomy and ileal conduit by urology [name] on 10/25/2021, and 10/26/2021. Underwent robotic assisted laparoscopic radical cystectomy, robotic radical prostatectomy, bilateral pelvic lymph node dissection, extensive laparoscopic adhesiolysis, small bowel resection and ileal conduit urinary diversion on 10/25/2021 and on 10/26/2021 by [name]. -Urology team following   COPD History of tobacco abuse -Continue Incruse, as needed bronchodilators, I-S/flutter.   Suspected ILD Noted on CT imaging this admission.  History of tobacco abuse. --Flonase --Outpatient pulmonology follow-up   Weakness/deconditioning/debility: PT/OT- HH  Overall ptn is quite Ill. Seen by palliative care. Cont to be full code at this time.     DVT prophylaxis: Lovenox Code Status: Full code Family Communication:   Status is: Inpatient  Remains inpatient appropriate because: Multiple ongoing issues as mentioned above, not ready for discharge.  Patient will need to be cleared by multiple services including ID, nephrology and urology prior to discharge  Nutritional status  Nutrition Problem: Inadequate oral intake Etiology: decreased appetite  Signs/Symptoms: per patient/family report  Interventions: Tube feeding, Prostat  Body mass index is 26.24 kg/m.     Subjective: Resting in the bed comfortably, wake up  to his name. Very tired doesn't participate in the full conversation.   Review of Systems Otherwise negative except as per HPI, including: General = no fevers, chills, dizziness,  fatigue HEENT/EYES = negative for loss of vision, double vision, blurred vision,  sore throa Cardiovascular= negative for chest pain, palpitation Respiratory/lungs= negative for shortness of breath, cough, wheezing; hemoptysis,  Gastrointestinal= negative for nausea, vomiting, abdominal pain Genitourinary= negative for Dysuria MSK = Negative for arthralgia, myalgias Neurology= Negative for headache, numbness, tingling  Psychiatry= Negative for suicidal and homocidal ideation Skin= Negative for Rash   Examination: Constitutional: NAD, elderly frail, weak, b/l temporal wasting. Cortrak in place.  Respiratory: some bilateral rhonchi.  Cardiovascular: Normal sinus rhythm, no rubs Abdomen: Nontender nondistended good bowel sounds Musculoskeletal: No edema noted Skin: No rashes seen Neurologic: CN 2-12 grossly intact.  And nonfocal Psychiatric: Alter to name and place.    PICC line in place; LUE Drain in place Foley catheter in place Cortrak In place.   Objective: Vitals:   11/27/21 0800 11/27/21 0832 11/27/21 0900 11/27/21 1000  BP: (!) 150/79  124/68 (!) 98/46  Pulse: (!) 110  99 89  Resp: (!) 21  18 (!) 22  Temp: (!) 97.5 F (36.4 C)     TempSrc: Axillary     SpO2: 94% 95% 95% 96%  Weight:      Height:        Intake/Output Summary (Last 24 hours) at 11/27/2021 1235 Last data filed at 11/27/2021 0740 Gross per 24 hour  Intake 1581.92 ml  Output 5625 ml  Net -4043.08 ml   Filed Weights   11/24/21 0500 11/25/21 0433 11/26/21 0600  Weight: 82.8 kg 79.7  kg 76 kg     Data Reviewed:   CBC: Recent Labs  Lab 11/21/21 0515 11/21/21 1955 11/22/21 0500 11/23/21 0400 11/24/21 0549 11/25/21 0427 11/26/21 0538 11/27/21 0953  WBC 8.4   < > 9.2 8.3 10.2 9.2 9.7 11.0*  NEUTROABS 6.4  --   7.3 6.3 7.8* 6.4  --   --   HGB 7.7*   < > 9.7* 9.0* 9.3* 9.9* 10.3* 10.1*  HCT 24.4*   < > 29.7* 27.7* 29.4* 32.0* 33.3* 34.1*  MCV 92.1   < > 90.8 91.1 92.2 93.8 95.1 97.4  PLT 315   < > 308 315 375 390 355 408*   < > = values in this interval not displayed.   Basic Metabolic Panel: Recent Labs  Lab 11/24/21 1606 11/25/21 0427 11/25/21 1700 11/26/21 0500 11/26/21 0538 11/26/21 1607 11/26/21 2338 11/27/21 0953  NA 149* 151* 154*  --  159* 158* 159* 158*  K 3.1* 2.6* 3.6  --  3.3* 3.8 3.3* 3.1*  CL 117* 116* 124*  --  129* 129* 126* 123*  CO2 22 26 27   --  27 27 29 29   GLUCOSE 107* 102* 157*  --  143* 135* 147* 140*  BUN 10 12 12   --  14 16 17 16   CREATININE 0.82 0.83 0.76  --  0.83 0.83 0.87 0.87  CALCIUM 8.6* 8.6* 8.7*  --  9.0 8.9 8.7* 8.8*  MG 1.8 1.9 1.9 2.0  --   --   --  2.2  PHOS 2.2* 2.6 1.9* 2.6  --   --   --  3.5   GFR: Estimated Creatinine Clearance: 64.4 mL/min (by C-G formula based on SCr of 0.87 mg/dL). Liver Function Tests: Recent Labs  Lab 11/21/21 0515 11/22/21 0500 11/23/21 0400 11/27/21 0953  AST 27 26 19 30   ALT 16 15 12 18   ALKPHOS 67 83 71 77  BILITOT 0.7 1.0 1.0 0.5  PROT 5.2* 5.5* 5.8* 6.9  ALBUMIN 1.5*  <1.5* <1.5* 2.3* 2.2*   No results for input(s): LIPASE, AMYLASE in the last 168 hours.  No results for input(s): AMMONIA in the last 168 hours. Coagulation Profile: No results for input(s): INR, PROTIME in the last 168 hours.  Cardiac Enzymes: No results for input(s): CKTOTAL, CKMB, CKMBINDEX, TROPONINI in the last 168 hours. BNP (last 3 results) No results for input(s): PROBNP in the last 8760 hours. HbA1C: No results for input(s): HGBA1C in the last 72 hours. CBG: Recent Labs  Lab 11/26/21 1935 11/26/21 2307 11/27/21 0555 11/27/21 0755 11/27/21 1139  GLUCAP 142* 139* 134* 139* 134*   Lipid Profile: Recent Labs    11/27/21 0905  TRIG 136    Thyroid Function Tests: No results for input(s): TSH, T4TOTAL, FREET4,  T3FREE, THYROIDAB in the last 72 hours.  Anemia Panel: No results for input(s): VITAMINB12, FOLATE, FERRITIN, TIBC, IRON, RETICCTPCT in the last 72 hours. Sepsis Labs: Recent Labs  Lab 11/26/21 0500  PROCALCITON <0.10     Recent Results (from the past 240 hour(s))  MRSA Next Gen by PCR, Nasal     Status: None   Collection Time: 11/17/21  6:24 PM   Specimen: Nasal Mucosa; Nasal Swab  Result Value Ref Range Status   MRSA by PCR Next Gen NOT DETECTED NOT DETECTED Final    Comment: (NOTE) The GeneXpert MRSA Assay (FDA approved for NASAL specimens only), is one component of a comprehensive MRSA colonization surveillance program. It is not intended to diagnose  MRSA infection nor to guide or monitor treatment for MRSA infections. Test performance is not FDA approved in patients less than 15 years old. Performed at [facility], [place] [address] [place], [place] [address]   C Difficile Quick Screen (NO PCR Reflex)     Status: None   Collection Time: 11/17/21  8:40 PM   Specimen: STOOL  Result Value Ref Range Status   C Diff antigen NEGATIVE NEGATIVE Final   C Diff toxin NEGATIVE NEGATIVE Final   C Diff interpretation NEGATIVE  Final    Comment: Performed at [facility], [place] [address], [place], [place] [address]  Aerobic/Anaerobic Culture w Gram Stain (surgical/deep wound)     Status: None   Collection Time: 11/18/21 11:01 AM   Specimen: Abdomen; Abscess  Result Value Ref Range Status   Specimen Description   Final    ABDOMEN Performed at [facility] [address] [place], [place] [address]    Special Requests   Final    ABSCES Performed at [facility], [place] [address], [place], [place] [address]    Gram Stain   Final    MODERATE WBC PRESENT,BOTH PMN AND MONONUCLEAR NO ORGANISMS SEEN    Culture   Final    RARE ESCHERICHIA COLI RARE CANDIDA ALBICANS NO ANAEROBES ISOLATED Performed at [facility], [address], [place], [place] [address]    Report Status 11/23/2021 FINAL  Final   Organism ID, Bacteria ESCHERICHIA COLI  Final      Susceptibility   Escherichia coli - MIC*    AMPICILLIN <=2 SENSITIVE Sensitive     CEFAZOLIN <=4 SENSITIVE Sensitive     CEFEPIME <=0.12 SENSITIVE Sensitive     CEFTAZIDIME <=1 SENSITIVE Sensitive     CEFTRIAXONE <=0.25 SENSITIVE Sensitive     CIPROFLOXACIN <=0.25 SENSITIVE Sensitive     GENTAMICIN <=1 SENSITIVE Sensitive     IMIPENEM <=0.25 SENSITIVE Sensitive     TRIMETH/SULFA <=20 SENSITIVE Sensitive     AMPICILLIN/SULBACTAM <=2 SENSITIVE Sensitive     PIP/TAZO <=4 SENSITIVE Sensitive     * RARE ESCHERICHIA COLI  Fungus Stain     Status: None   Collection Time: 11/18/21 11:01 AM   Specimen: Abscess  Result Value Ref Range Status   FUNGUS STAIN Final report  Final    Comment: (NOTE) Performed At: [facility] [address] [place], [place] [id number] Rush Farmer MD [id number]    Fungal Source ABSCESS  Final    Comment: Performed at [facility], [place] [address], [place], [place] [address]  Fungal Stain reflex     Status: None   Collection Time: 11/18/21 11:01 AM  Result Value Ref Range Status   Fungal stain result 1 Comment  Final    Comment: (NOTE) KOH/Calcofluor preparation:  no fungus observed. Performed At: [facility] [place], [place] [id number] Rush Farmer MD [id number]   Culture, fungus without smear     Status: Abnormal (Preliminary result)   Collection Time: 11/18/21 11:02 AM   Specimen: Abscess; Other  Result Value Ref Range Status   Specimen Description   Final    ABSCESS Performed at [place] [address] [place], [place] [address]    Special Requests ABDOMEN  Final   Culture (A)  Final    CANDIDA ALBICANS CONTINUING TO HOLD Performed at [facility], [place] [address], [place], [place] [address]  Report Status PENDING   Incomplete  Urine Culture     Status: Abnormal   Collection Time: 11/18/21  3:03 PM   Specimen: Urine, Clean Catch  Result Value Ref Range Status   Specimen Description   Final    URINE, CLEAN CATCH Performed at [facility], [place] [address], [place], [place] [address]    Special Requests   Final    NONE Performed at [facility], [place] [address], [place], [place] [address]    Culture (A)  Final    <10,000 COLONIES/mL INSIGNIFICANT GROWTH Performed at [facility] [address] [place], [place] [address]    Report Status 11/20/2021 FINAL  Final  Culture, blood (routine x 2)     Status: Abnormal   Collection Time: 11/18/21  7:27 PM   Specimen: BLOOD  Result Value Ref Range Status   Specimen Description   Final    BLOOD BLOOD LEFT WRIST Performed at [place] [address] [place], [place] [address]    Special Requests   Final    BOTTLES DRAWN AEROBIC ONLY Blood Culture adequate volume Performed at [facility] [address] [place], [place] [address]    Culture  Setup Time   Final    YEAST AEROBIC BOTTLE ONLY CRITICAL VALUE NOTED.  VALUE IS CONSISTENT WITH PREVIOUSLY REPORTED AND CALLED VALUE.    Culture (A)  Final    CANDIDA ALBICANS SUSCEPTIBILITIES PERFORMED ON PREVIOUS CULTURE WITHIN THE LAST 5 DAYS. Performed at [facility], [place] [address] [place], [place] [address]    Report Status 11/25/2021 FINAL  Final  Fungus culture, blood     Status: Abnormal   Collection Time: 11/18/21  7:28 PM   Specimen: BLOOD  Result Value Ref Range Status   Specimen Description   Final    BLOOD BLOOD LEFT HAND Performed at [place] [address] [place], [place] [address]    Special Requests   Final    BOTTLES DRAWN AEROBIC ONLY Blood Culture adequate volume Performed at [place] [address], [place], [place] [address]    Fungal  Smear   Final    YEAST AEROBIC BOTTLE ONLY CRITICAL VALUE NOTED.  VALUE IS CONSISTENT WITH PREVIOUSLY REPORTED AND CALLED VALUE.    Culture (A)  Final    CANDIDA ALBICANS SUSCEPTIBILITIES PERFORMED ON PREVIOUS CULTURE WITHIN THE LAST 5 DAYS. Performed at [facility], [place] [address], [place], [place] [address]    Report Status 11/26/2021 FINAL  Final  Culture, blood (routine x 2)     Status: None   Collection Time: 11/18/21  7:28 PM   Specimen: BLOOD  Result Value Ref Range Status   Specimen Description   Final    BLOOD BLOOD LEFT HAND Performed at [place] [address] [place], [place] [address]    Special Requests   Final    BOTTLES DRAWN AEROBIC ONLY Blood Culture adequate volume Performed at [place] [address], [place], [place] [address]    Culture   Final    NO GROWTH 5 DAYS Performed at [facility], [place] [address], [place], [place] [address]    Report Status 11/24/2021 FINAL  Final  Culture, blood (Routine X 2) w Reflex to ID Panel     Status: None (Preliminary result)   Collection Time: 11/24/21  4:06 PM   Specimen:  BLOOD  Result Value Ref Range Status   Specimen Description   Final    BLOOD BLOOD RIGHT HAND Performed at [facility] [address], [place], [place] [address]    Special Requests   Final    BOTTLES DRAWN AEROBIC ONLY Blood Culture results may not be optimal due to an inadequate volume of blood received in culture bottles Performed at [facility] [address] [place], [place] [address]    Culture   Final    NO GROWTH 3 DAYS Performed at [facility], [place] [address] [place], [place] [address]    Report Status PENDING  Incomplete  Culture, blood (Routine X 2) w Reflex to ID Panel     Status: None (Preliminary result)   Collection Time: 11/24/21  4:06 PM   Specimen: BLOOD  Result Value Ref Range Status   Specimen Description    Final    BLOOD BLOOD RIGHT WRIST Performed at [place] [address] [place], [place] [address]    Special Requests   Final    BOTTLES DRAWN AEROBIC ONLY Blood Culture adequate volume Performed at [place] [address] [place], [place] [address]    Culture   Final    NO GROWTH 3 DAYS Performed at [facility], [place] [address] [place], [place] [address]    Report Status PENDING  Incomplete         Radiology Studies: DG Chest Port 1 View  Result Date: 11/26/2021 CLINICAL DATA:  Sepsis.  Respiratory failure. EXAM: PORTABLE CHEST 1 VIEW COMPARISON:  11/23/2021 FINDINGS: Left subclavian central venous catheter remains in appropriate position. A new feeding tube is seen entering the stomach, although distal tip is not within the field of view. Heart size is stable. Diffuse bilateral pulmonary airspace disease shows no significant change. No pneumothorax visualized. IMPRESSION: New feeding tube is seen entering the stomach. No significant change in diffuse bilateral pulmonary airspace disease. Electronically Signed   By: Marlaine Hind M.D.   On: 11/26/2021 08:24        Scheduled Meds:  chlorhexidine  15 mL Mouth Rinse BID   Chlorhexidine Gluconate Cloth  6 each Topical Daily   enoxaparin (LOVENOX) injection  80 mg Subcutaneous Q12H   feeding supplement (PROSource TF)  45 mL Per Tube BID   fluticasone  2 spray Each Nare Daily   free water  50 mL Per Tube Q4H   insulin aspart  0-9 Units Subcutaneous Q6H   lip balm  1 application Topical BID   mouth rinse  15 mL Mouth Rinse q12n4p   metoprolol tartrate  37.5 mg Per Tube BID   multivitamin  15 mL Per Tube Daily   nystatin  5 mL Oral QID   nystatin   Topical BID   QUEtiapine  12.5 mg Per Tube QHS   sodium chloride flush  10-40 mL Intracatheter Q12H   sodium chloride flush  10-40 mL Intracatheter Q12H   sodium chloride flush  5 mL Intracatheter Q8H   thiamine injection  100  mg Intravenous Daily   traZODone  50 mg Per Tube QHS   umeclidinium bromide  1 puff Inhalation Daily   Continuous Infusions:  sodium chloride Stopped (11/23/21 1558)   sodium chloride Stopped (11/21/21 1838)   anidulafungin 100 mg (11/27/21 1220)   chlorproMAZINE (THORAZINE) IV Stopped (11/22/21 0414)   dextrose 50 mL/hr at 11/27/21 0841   feeding supplement (OSMOLITE  1.5 CAL) 45 mL/hr at 11/27/21 0253   methocarbamol (ROBAXIN) IV Stopped (11/20/21 2252)   ondansetron (ZOFRAN) IV       LOS: 18 days   Time spent= 35 mins    Juell Radney Arsenio Loader, MD [facility]  If 7PM-7AM, please contact night-coverage  11/27/2021, 12:35 PM

## 2021-11-27 NOTE — Progress Notes (Signed)
[facility] for Infectious Disease    Date of Admission:  11/08/2021   Total days of antibiotics: 9 of anidulafungin          ID: William [name] is a 80 y.o. male with bladder cancer who remains critically ill, initially for urinary source sepsis, found to have perinephric/intra pelvic abscess with fungemia with c.albicans. course complicated with DVT to right arm, persistent fungemia, and poor nutritional intake Principal Problem:   Septic shock ([place]) Active Problems:   Chronic pain   Bladder cancer ([place])   Bacteremia due to Gram-negative bacteria   UTI due to Klebsiella species   Acute kidney injury ([place])   Intra-abdominal abscess ([name])   S/P PICC central line placement   Fungemia   Swelling of joint of upper arm, right   Shock ([place])   Small bowel obstruction ([place])   Left arm swelling   Encounter for central line placement   Encounter for feeding tube placement   Encounter for imaging study to confirm nasogastric (NG) tube placement   Hypoxia   Nasogastric tube present   SBO (small bowel obstruction) ([place])   Candidemia ([place])    Subjective: Remains lethargic, still requiring hand restraints. Dophoff feeding tube is in place. Less discomfort to arms (previously swollen)  Medications:   chlorhexidine  15 mL Mouth Rinse BID   Chlorhexidine Gluconate Cloth  6 each Topical Daily   enoxaparin (LOVENOX) injection  80 mg Subcutaneous Q12H   feeding supplement (PROSource TF)  45 mL Per Tube BID   fluticasone  2 spray Each Nare Daily   free water  50 mL Per Tube Q4H   insulin aspart  0-9 Units Subcutaneous Q6H   lip balm  1 application Topical BID   mouth rinse  15 mL Mouth Rinse q12n4p   metoprolol tartrate  37.5 mg Per Tube BID   multivitamin  15 mL Per Tube Daily   nystatin  5 mL Oral QID   nystatin   Topical BID   QUEtiapine  12.5 mg Per Tube QHS   sodium chloride flush  10-40 mL Intracatheter Q12H   sodium chloride flush  10-40 mL Intracatheter Q12H   sodium  chloride flush  5 mL Intracatheter Q8H   thiamine injection  100 mg Intravenous Daily   traZODone  50 mg Per Tube QHS   umeclidinium bromide  1 puff Inhalation Daily    Objective: Vital signs in last 24 hours: Temp:  [97.3 F (36.3 C)-100.5 F (38.1 C)] 97.8 F (36.6 C) (11/28 1200) Pulse Rate:  [87-113] 89 (11/28 1000) Resp:  [14-30] 22 (11/28 1000) BP: (98-192)/(46-104) 98/46 (11/28 1000) SpO2:  [93 %-96 %] 96 % (11/28 1000) Physical Exam  Constitutional: He is solomnent. He appears chronically ill and under-nourished. No distress.  HENT:  Mouth/Throat: Oropharynx is clear and moist. No oropharyngeal exudate.  Cardiovascular: Normal rate, regular rhythm and normal heart sounds. Exam reveals no gallop and no friction rub.  No murmur heard.  Pulmonary/Chest: Effort normal and breath sounds normal. No respiratory distress. He has no wheezes.  Abdominal: Soft. Bowel sounds are normal. He exhibits no distension. Urostomy in place Lymphadenopathy:  He has no cervical adenopathy.  Skin: Skin is warm and dry. No rash noted. No erythema.  Psychiatric: He has a normal mood and affect. His behavior is normal.    Lab Results Recent Labs    11/26/21 0538 11/26/21 1607 11/26/21 2338 11/27/21 0953  WBC 9.7  --   --  11.0*  HGB 10.3*  --   --  10.1*  HCT 33.3*  --   --  34.1*  NA 159*   < > 159* 158*  K 3.3*   < > 3.3* 3.1*  CL 129*   < > 126* 123*  CO2 27   < > 29 29  BUN 14   < > 17 16  CREATININE 0.83   < > 0.87 0.87   < > = values in this interval not displayed.   Liver Panel Recent Labs    11/27/21 0953  PROT 6.9  ALBUMIN 2.2*  AST 30  ALT 18  ALKPHOS 77  BILITOT 0.5    Microbiology: 11/25= blood cx NGTD Studies/Results: DG Chest Port 1 View  Result Date: 11/26/2021 CLINICAL DATA:  Sepsis.  Respiratory failure. EXAM: PORTABLE CHEST 1 VIEW COMPARISON:  11/23/2021 FINDINGS: Left subclavian central venous catheter remains in appropriate position. A new feeding  tube is seen entering the stomach, although distal tip is not within the field of view. Heart size is stable. Diffuse bilateral pulmonary airspace disease shows no significant change. No pneumothorax visualized. IMPRESSION: New feeding tube is seen entering the stomach. No significant change in diffuse bilateral pulmonary airspace disease. Electronically Signed   By: Marlaine Hind M.D.   On: 11/26/2021 08:24     Assessment/Plan: Fungemia secondary to perinephric/pelvic abscess = continue on anidulafungin  DVT associated with picc line in right arm = continue with anti-coagulation  Overall prognosis is still guarded. He appears weaker, less coherent than last week. Will discuss with primary team when readdressing goals of care.  [facility] for Infectious Diseases Pager: [phone]  11/27/2021, 4:14 PM

## 2021-11-27 NOTE — Progress Notes (Addendum)
Patient found writhing onto his R side crying "help" repeatedly. When asked if his bottom hurts, he cried, "yeah, yeah, yeah". While repositioning him, I noted that his flexiseal had been pulled out of his rectum. The flexiseal was not reinserted as this is the third time in 3 days it has been accidentally pulled out. [facility] hospitalist NP was notified and a one-time order for an extra dose of dilaudid was obtained. Pt has been requiring round the clock dilaudid since 11/25. It is ordered Q3 PRN, but patient is often an 8 on the CPOT scale by 2 hours. Better pain management is needed.

## 2021-11-27 NOTE — Progress Notes (Signed)
PT Cancellation Note  Patient Details Name: William [name] MRN: [id number] DOB: 1941/05/14   Cancelled Treatment:    Reason Eval/Treat Not Completed: Fatigue/lethargy limiting ability to participate (pt sleeping soundly, he did not arouse to verbal/tactile stimulii. Will follow.)  Philomena Doheny PT 11/27/2021  Acute Rehabilitation Services Pager [phone] Office [phone]

## 2021-11-27 NOTE — Progress Notes (Signed)
PMT no charge note.   Chart reviewed, discussed with TRH MD colleague Dr Reesa Chew earlier in the day, opioids dose adjusted to allow for better symptom control, call placed, however not able  to reach wife Mrs Mizzell today at [phone]. At time of initial consultation, she wished to continue with full code, full scope of care status, had hoped for continuation of current measures and stated that the patient was really doing "very well" up until very recently. Hence, PMT following peripherally as part of time limited trial of current interventions at this time. Additionally, correspondence was received from the patient's PCP that the patient had a good functional status up until recently. Hospital course unfortunately has only been one of ongoing decline with a high risk for further decompensation, inspite of current measures.  PMT will re attempt to reach out to patient's wife Mrs Figg on 11-28-21 to continue code status and broad goals of care discussions.  No charge Loistine Chance MD [place] palliative.

## 2021-11-27 NOTE — Progress Notes (Signed)
ANTICOAGULATION CONSULT NOTE - Initial Consult  Pharmacy Consult for enoxaparin Indication: DVT of the RUE  Allergies  Allergen Reactions   Hydrocodone Other (See Comments)    Causes constipation even with stool softner    Patient Measurements: Height: 5\' 7"  (170.2 cm) Weight: 76 kg (167 lb 8.8 oz) IBW/kg (Calculated) : 66.1  Vital Signs: Temp: 97.8 F (36.6 C) (11/28 1200) Temp Source: Axillary (11/28 1200) BP: 98/46 (11/28 1000) Pulse Rate: 89 (11/28 1000)  Labs: Recent Labs    11/25/21 0427 11/25/21 1700 11/26/21 0538 11/26/21 1607 11/26/21 2338 11/27/21 0953  HGB 9.9*  --  10.3*  --   --  10.1*  HCT 32.0*  --  33.3*  --   --  34.1*  PLT 390  --  355  --   --  408*  CREATININE 0.83   < > 0.83 0.83 0.87 0.87   < > = values in this interval not displayed.     Estimated Creatinine Clearance: 64.4 mL/min (by C-G formula based on SCr of 0.87 mg/dL).   Medical History: Past Medical History:  Diagnosis Date   Bladder cancer ([place])    Pt says small tumor not cancer   Bleeding gums 11/11/2015   COVID 09/23/2020   back ache hurt all over x few days all symptoms reolved   Dyslipidemia (high LDL; low HDL)    Low back pain 12/27/2014   Medial epicondylitis of right elbow 12/13/2014   Numbness and tingling of right leg    Tinnitus    all the time   Toenail fungus    2 big toes   Wears glasses     Medications: Not on anticoagulants PTA -Currently prescribed heparin subQ for DVT ppx inpatient  Assessment: Pt is a 56 yoM admitted with septic shock and SBO. PICC line was initially placed on 11/17; pt had right arm swelling so upper extremity venous doppler ordered showing acute DVT of right brachial veins. Pharmacy has been consulted to dose therapeutic enoxaparin.   Note that patient had AKI during admission - renal function is improved. Nephrology signed off today. Hgb remains below baseline of ~13; was transfused on 11/19 and 11/22.   Today, 11/27/21 SCr    0.87  - remains elevated, but improving. CrCl ~65 mL/min CBC: Hgb (10.1) remains low but fairly stable s/p transfusion. Plt WNL   Goal of Therapy:  Monitor platelets by anticoagulation protocol: Yes   Plan:  Enoxaparin 1 mg/kg (80 mg) subQ q12h Monitor renal function, CBC Monitor for signs of bleeding Follow along for eventual transition to PO anticoagulation   Royetta Asal, PharmD, BCPS 11/27/2021 2:55 PM

## 2021-11-27 NOTE — Progress Notes (Signed)
SLP Cancellation Note  Patient Details Name: BERTIS HUSTEAD MRN: [id number] DOB: 17-Aug-1941   Cancelled treatment:       Reason Eval/Treat Not Completed: Fatigue/lethargy limiting ability to participate;Medical issues which prohibited therapy. Patient unarousable and mouth with dried secretions which are dark red/black in color. Per RN, patient at baseline since previous shift. SLP will f/u with nursing next date to determine if appropriate to be seen.   Sonia Baller, MA, CCC-SLP Speech Therapy

## 2021-11-28 ENCOUNTER — Inpatient Hospital Stay (HOSPITAL_COMMUNITY): Payer: Medicare HMO

## 2021-11-28 DIAGNOSIS — R7881 Bacteremia: Secondary | ICD-10-CM | POA: Diagnosis not present

## 2021-11-28 DIAGNOSIS — R6521 Severe sepsis with septic shock: Secondary | ICD-10-CM | POA: Diagnosis not present

## 2021-11-28 DIAGNOSIS — Z7189 Other specified counseling: Secondary | ICD-10-CM | POA: Diagnosis not present

## 2021-11-28 DIAGNOSIS — Z515 Encounter for palliative care: Secondary | ICD-10-CM | POA: Diagnosis not present

## 2021-11-28 DIAGNOSIS — A419 Sepsis, unspecified organism: Secondary | ICD-10-CM | POA: Diagnosis not present

## 2021-11-28 LAB — GLUCOSE, CAPILLARY
Glucose-Capillary: 97 mg/dL (ref 70–99)
Glucose-Capillary: 129 mg/dL — ABNORMAL HIGH (ref 70–99)
Glucose-Capillary: 149 mg/dL — ABNORMAL HIGH (ref 70–99)
Glucose-Capillary: 95 mg/dL (ref 70–99)
Glucose-Capillary: 134 mg/dL — ABNORMAL HIGH (ref 70–99)
Glucose-Capillary: 143 mg/dL — ABNORMAL HIGH (ref 70–99)
Glucose-Capillary: 140 mg/dL — ABNORMAL HIGH (ref 70–99)

## 2021-11-28 LAB — CBC
HCT: 34.6 % — ABNORMAL LOW (ref 39.0–52.0)
Hemoglobin: 10.2 g/dL — ABNORMAL LOW (ref 13.0–17.0)
MCH: 28.6 pg (ref 26.0–34.0)
MCHC: 29.5 g/dL — ABNORMAL LOW (ref 30.0–36.0)
MCV: 96.9 fL (ref 80.0–100.0)
Platelets: 343 10*3/uL (ref 150–400)
RBC: 3.57 MIL/uL — ABNORMAL LOW (ref 4.22–5.81)
RDW: 15.9 % — ABNORMAL HIGH (ref 11.5–15.5)
WBC: 14.8 10*3/uL — ABNORMAL HIGH (ref 4.0–10.5)
nRBC: 0 % (ref 0.0–0.2)

## 2021-11-28 LAB — BASIC METABOLIC PANEL
Anion gap: 6 (ref 5–15)
BUN: 22 mg/dL (ref 8–23)
CO2: 28 mmol/L (ref 22–32)
Calcium: 7.9 mg/dL — ABNORMAL LOW (ref 8.9–10.3)
Chloride: 112 mmol/L — ABNORMAL HIGH (ref 98–111)
Creatinine, Ser: 0.97 mg/dL (ref 0.61–1.24)
GFR, Estimated: >60 mL/min (ref >60)
Glucose, Bld: 413 mg/dL — ABNORMAL HIGH (ref 70–99)
Potassium: 3 mmol/L — ABNORMAL LOW (ref 3.5–5.1)
Sodium: 146 mmol/L — ABNORMAL HIGH (ref 135–145)

## 2021-11-28 MED ORDER — LEVALBUTEROL HCL 0.63 MG/3ML IN NEBU
INHALATION_SOLUTION | RESPIRATORY_TRACT | Status: AC
  Filled 2021-11-28: qty 3

## 2021-11-28 MED ORDER — POTASSIUM CHLORIDE 20 MEQ PO PACK
PACK | ORAL | Status: AC
  Administered 2021-11-28: 40 meq
  Filled 2021-11-28: qty 1

## 2021-11-28 MED ORDER — FUROSEMIDE 10 MG/ML IJ SOLN
20.0000 mg | Freq: Once | INTRAMUSCULAR | Status: AC
  Administered 2021-11-28: 20 mg via INTRAVENOUS

## 2021-11-28 MED ORDER — POTASSIUM CHLORIDE 20 MEQ PO PACK
40.0000 meq | PACK | ORAL | Status: AC
  Administered 2021-11-28: 40 meq

## 2021-11-28 MED ORDER — POTASSIUM CHLORIDE 20 MEQ PO PACK
PACK | ORAL | Status: AC
  Filled 2021-11-28: qty 1

## 2021-11-28 MED ORDER — INSULIN ASPART 100 UNIT/ML IJ SOLN
0.0000 [IU] | INTRAMUSCULAR | Status: DC
  Administered 2021-11-28 – 2021-11-29 (×4): 1 [IU] via SUBCUTANEOUS
  Administered 2021-11-29: 2 [IU] via SUBCUTANEOUS
  Administered 2021-11-29: 1 [IU] via SUBCUTANEOUS
  Administered 2021-11-29: 2 [IU] via SUBCUTANEOUS

## 2021-11-28 MED ORDER — FUROSEMIDE 10 MG/ML IJ SOLN
INTRAMUSCULAR | Status: AC
  Filled 2021-11-28: qty 2

## 2021-11-28 MED ORDER — LEVALBUTEROL HCL 0.63 MG/3ML IN NEBU
0.6300 mg | INHALATION_SOLUTION | Freq: Four times a day (QID) | RESPIRATORY_TRACT | Status: DC | PRN
  Administered 2021-11-28: 0.63 mg via RESPIRATORY_TRACT

## 2021-11-28 NOTE — Progress Notes (Signed)
Notifed by RN regarding ptns increase in O2 requirement, now on 15L HF and NRB.  CXR shows overall improvement but has b/l diffuse rhonchi. When I saw him his BP was stable but HR in 120s Sinus tachycardia.   At this time, we will have to stop his IVF D5W, give him lasix IV. Monitor him closely, he remains DNR/DNI. Overall continues to poor prognosis. He is not a good candidate for BiPAP due to AMS, and inability to clear his secretion/aspiration. Cont Supplemental o2 and NRB as needed.    Author [place]  TRH

## 2021-11-28 NOTE — Progress Notes (Signed)
Placed patient on heated HFNC 50L, 100% FiO2 at this time. Patients sats fluctuating between 85-90%. RN at bedside.

## 2021-11-28 NOTE — Progress Notes (Signed)
Physical Therapy Discharge Patient Details Name: William [name] MRN: [id number] DOB: 08-Dec-1941 Today's Date: 11/28/2021 Time:  -     Patient discharged from PT services secondary to medical decline - will need to re-order PT to resume therapy services. Palliative following and  heading towards comfort care.  Please see latest therapy progress note for current level of functioning and progress toward goals.    Progress and discharge plan discussed with patient and/or caregiver: NA  GP [place] Pager [phone] Office [phone]      Claretha Cooper 11/28/2021, 1:03 PM

## 2021-11-28 NOTE — Progress Notes (Signed)
[facility] for Infectious Disease    Date of Admission:  11/15/2021    ID: William [name] is a 80 y.o. male with   Principal Problem:   Septic shock ([place]) Active Problems:   Chronic pain   Bladder cancer ([place])   Bacteremia due to Gram-negative bacteria   UTI due to Klebsiella species   Acute kidney injury ([place])   Intra-abdominal abscess ([place])   S/P PICC central line placement   Fungemia   Swelling of joint of upper arm, right   Shock ([place])   Small bowel obstruction ([place])   Left arm swelling   Encounter for central line placement   Encounter for feeding tube placement   Encounter for imaging study to confirm nasogastric (NG) tube placement   Hypoxia   Nasogastric tube present   SBO (small bowel obstruction) ([place])   Candidemia ([place])    Subjective: Afebrile, but mentation is poor, now requiring NRB over course of the day, suspecting he had aspiration on secretions. Repeat CXR per my read shows some improvement in aeration.  Medications:   chlorhexidine  15 mL Mouth Rinse BID   Chlorhexidine Gluconate Cloth  6 each Topical Daily   enoxaparin (LOVENOX) injection  80 mg Subcutaneous Q12H   feeding supplement (PROSource TF)  45 mL Per Tube BID   fluticasone  2 spray Each Nare Daily   free water  50 mL Per Tube Q4H   furosemide  20 mg Intravenous Once   insulin aspart  0-9 Units Subcutaneous Q4H   lip balm  1 application Topical BID   mouth rinse  15 mL Mouth Rinse q12n4p   metoprolol tartrate  37.5 mg Per Tube BID   multivitamin  15 mL Per Tube Daily   nystatin  5 mL Oral QID   nystatin   Topical BID   potassium chloride  40 mEq Per Tube Q4H   QUEtiapine  12.5 mg Per Tube QHS   sodium chloride flush  10-40 mL Intracatheter Q12H   sodium chloride flush  10-40 mL Intracatheter Q12H   sodium chloride flush  5 mL Intracatheter Q8H   thiamine injection  100 mg Intravenous Daily   traZODone  50 mg Per Tube QHS   umeclidinium bromide  1 puff Inhalation Daily     Objective: Vital signs in last 24 hours: Temp:  [97.6 F (36.4 C)-99.9 F (37.7 C)] 98.2 F (36.8 C) (11/29 1141) Pulse Rate:  [64-130] 130 (11/29 1400) Resp:  [14-30] 30 (11/29 1400) BP: (84-158)/(47-87) 146/87 (11/29 1400) SpO2:  [88 %-100 %] 100 % (11/29 1400) Weight:  [76.1 kg] 76.1 kg (11/29 0500) Physical Exam  Constitutional: lethargic. He appears chronically ill and cachetic. No distress.  HENT:  Mouth/Throat: Oropharynx is clear and moist. No oropharyngeal exudate. Dry, poor dentition Cardiovascular: Normal rate, regular rhythm and normal heart sounds. Exam reveals no gallop and no friction rub.  No murmur heard.  Pulmonary/Chest: Effort normal and breath sounds rhonchirous bilaterally Abdominal: Soft. Bowel sounds are normal. He exhibits no distension. There is no tenderness. Ext: trace pitting edema Skin: Skin is warm and dry. No rash noted. No erythema.   Lab Results Recent Labs    11/27/21 0953 11/27/21 1715 11/28/21 0500 11/28/21 0646  WBC 11.0*  --  14.8*  --   HGB 10.1*  --  10.2*  --   HCT 34.1*  --  34.6*  --   NA 158* 158*  --  146*  K 3.1* 3.3*  --  3.0*  CL 123* 123*  --  112*  CO2 29 30  --  28  BUN 16 19  --  22  CREATININE 0.87 0.89  --  0.97   Liver Panel Recent Labs    11/27/21 0953  PROT 6.9  ALBUMIN 2.2*  AST 30  ALT 18  ALKPHOS 77  BILITOT 0.5   Sedimentation Rate No results for input(s): ESRSEDRATE in the last 72 hours. C-Reactive Protein No results for input(s): CRP in the last 72 hours.  Microbiology: reviewed Studies/Results: DG Chest Port 1 View  Result Date: 11/28/2021 CLINICAL DATA:  Shortness of breath. EXAM: PORTABLE CHEST 1 VIEW COMPARISON:  Chest x-ray 11/26/2021 FINDINGS: The cardiac silhouette, mediastinal and hilar contours are within normal limits and stable. Stable feeding tube coursing down the esophagus and into the stomach. Left-sided PICC line is stable. Slight improved lung aeration. Bilateral  airspace process is slightly improved. No pleural effusions. IMPRESSION: Slight improved lung aeration. Electronically Signed   By: Marijo Sanes M.D.   On: 11/28/2021 14:58     Assessment/Plan: Fungemia with intra-abdominal/pelvic abscess = continue with anidulafungin for now  Aspiration pneuminitis = likely cause of current oxygen need. Continue with supportive needs, possibly bipap if escalation of care is needed, since he is dnr/dni  Overall prognosis is quite poor, agree with having multidisciplinary provide-patient's family meeting tomorrow at 9am to discuss goals of care.  [facility] for Infectious Diseases Pager: [phone]  11/28/2021, 3:53 PM

## 2021-11-28 NOTE — Progress Notes (Signed)
Patient NTS at this time obtaining copious amounts of thick tan secretions. Sats went from 82% to 88% and holding. Small amount of bleeding from nare noted. Will continue to monitor.

## 2021-11-28 NOTE — Progress Notes (Signed)
Notified MD of consistent desat to low 80s on 15L HFNC for approx 30 mins.  Patient continues to have congested, nonproductive cough and rhonchi to ausculation.  Gurgling sound can be heard at bedside.  No sputum obtained with [place] via oral suctioning after multiple attempts throughout the morning.  New orders placed.

## 2021-11-28 NOTE — TOC Progression Note (Signed)
Transition of Care [facility]) - Progression Note    Patient Details  Name: William [name] MRN: [id number] Date of Birth: 12-Apr-1941  [facility] CM/SW Contact  Ross Ludwig, [place] Phone Number: 11/28/2021, 4:20pm  Clinical Narrative:     Palliative meeting with patient's family tomorrow morning at 9am to discuss goals of care.  CSW to continue to follow patient's progress throughout discharge planning.    Barriers to Discharge: Continued Medical Work up  Expected Discharge Plan and Services                                                 Social Determinants of Health (SDOH) Interventions    Readmission Risk Interventions No flowsheet data found.

## 2021-11-28 NOTE — Progress Notes (Signed)
PROGRESS NOTE    William [name]  AFB:[id number] DOB: 01/14/1941 DOA: 11/13/2021 PCP: [name], MD   Brief Narrative:  80 year old with history of bladder cancer status post robotic assisted laparoscopic radical cystectomy/prostatectomy on 10/25/2021 by [name], bilateral pelvic lymph node dissection, extensive laparoscopic DCO lysis, small bowel resection with ileal conduit, urinary diversion on 10/26/2021, COPD, tobacco use initially discharged on 11/02/2021 but returned back to the hospital 11/20/2021 due to hypotension.  Patient was found to be in septic shock secondary to urinary tract infection from multiple species requiring vasopressors and was admitted to the ICU.  Eventually patient was transferred to ICU on 11/11/2021.  Hospital course was complicated by ileus/partial small bowel obstruction therefore general surgery was consulted.  This was conservatively managed.  Initially in renal function worsened but thereafter it slowly improved.  Repeat CT scan showed large intra-abdominal abscess 25.5 centimeters the cultures grew Candida albicans.  Patient was started on IV Diflucan, ID was Consulted and was switched to Eraxis.  IR drained intra-abdominal abscess on 11/19.  Also received PRBC transfusion during hospitalization.  Also found to have right upper extremity DVT and SVT therefore started on treatment with Lovenox.  Cortrak placed 11/25 to initiate tube feeding.  Even after several days patient continued to decline, he was eventually made DNR/DNI.  Palliative care team following.   Assessment & Plan:   Principal Problem:   Septic shock ([place]) Active Problems:   Chronic pain   Bladder cancer ([place])   Bacteremia due to Gram-negative bacteria   UTI due to Klebsiella species   Acute kidney injury ([place])   Intra-abdominal abscess ([name])   S/P PICC central line placement   Fungemia   Swelling of joint of upper arm, right   Shock ([place])   Small bowel obstruction ([name])   Left  arm swelling   Encounter for central line placement   Encounter for feeding tube placement   Encounter for imaging study to confirm nasogastric (NG) tube placement   Hypoxia   Nasogastric tube present   SBO (small bowel obstruction) ([name])   Candidemia ([name])  Septic shock, sepsis criteria is improving, no longer on vasopressor, secondary to Klebsiella oxytoca/Enterococcus faecalis UTI/Klebsiella oxytoca bacteremia, newly diagnosed Candida albicans fungemia Intra-abdominal abscess - Shock physiology resolved.  ID following.  Zosyn stopped 11/25. - Previous blood cultures 11/10-grew Klebsiella, urine cultures grew Enterococcus faecalis/Klebsiella - Blood cultures 11/18 grew Candida albicans. On IV Eraxis, can be swtiched to PO -ophthalmology [name] Close on 11/19/2021= Negative. Outptn follow up.   Hypernatremia, HyperChloremia - Sodium levels have improved, continue D5 water and free water flushes.  Right upper extremity DVT and SVT Left upper extremity SVT - Lovenox 1 mg/kg started.  Initially patient was on right upper extremity now in the left upper extremity.  Ideally would like to to be removed but there is no order IV access.  Sinus tachycardia/A fib rvr suspect related to sepsis and ongoing lung physiology -Due to underlying Pulm issues, Deconditioning and intravascular vol dep. IV Lopressor prn.  Continue p.o. metoprolol 37.5 mg twice daily. Replete Electrolytes. Echo (10/2021) - ef 55% g1DD.   Severe protein calorie malnutrition Hypoalbuminemia Hypernatremia due to clinical dehydration - Minimal to no oral intake..  Cortrak placed 11/25, tube feedings.  Continues to third spaced fluids.  Prealbumin <5  Mild acute respiratory distress with pulmonary edema Bilateral pleural effusion -Caution with IVF. ProCal and BNP repeated. CXR -bilateral patchy opacity.   I-S/flutter valve.  Bronchodilators.  Out of  bed to chair  Intra-abdominal abscess seen on CT scan measuring 25.5 cm   -Status post image guided drain placement LLQ by IR [name] on 11/18/2021.  IR to follow. - On IV Eraxis.  Zosyn stopped 11/25.  Small bowel obstruction/SBO -Initially noted on 11/10 on CT abdomen/pelvis, NG tube placed which was removed on 11/15.  Cortak in place since 11/25   Acute blood loss anemia; stable -Baseline hemoglobin 13.  Required multiple PRBC transfusion 1U 11/19; 2U 11/22.  No obvious signs of blood loss.  Hemoglobin stable around 10.2   Intermittent delirium -qhs trazadone and SeroquelDelirium protocol    Nonoliguric acute kidney injury; resolved -Baseline creatinine around 0.8.  Peaked 2.58.    Oral thrush -Already on Eraxis, Magic mouthwash with lidocaine   Refractory hypokalemia post repletion -As needed repletion of electrolytes.  He will have some refeeding syndrome.   Bladder cancer s/p radical cystectomy, prostatectomy and ileal conduit by urology [name] on 10/25/2021, and 10/26/2021. Underwent robotic assisted laparoscopic radical cystectomy, robotic radical prostatectomy, bilateral pelvic lymph node dissection, extensive laparoscopic adhesiolysis, small bowel resection and ileal conduit urinary diversion on 10/25/2021 and on 10/26/2021 by [name]. -Urology team following   COPD History of tobacco abuse -Continue Incruse, as needed bronchodilators, I-S/flutter.   Suspected ILD Noted on CT imaging this admission.  History of tobacco abuse. --Flonase --Outpatient pulmonology follow-up   Weakness/deconditioning/debility: PT/OT- HH  Overall patient is quite ill.  Family is agreeable to transition him to DNR/DNI.  Palliative care to follow him again later today.  DVT prophylaxis: Lovenox Code Status: Full code Family Communication: Met with the patient's both son and wife at bedside  Status is: Inpatient  Remains inpatient appropriate because: Multiple ongoing issues as mentioned above, not ready for discharge.  Patient will need to be  cleared by multiple services including ID, nephrology and urology prior to discharge  Nutritional status  Nutrition Problem: Inadequate oral intake Etiology: decreased appetite  Signs/Symptoms: per patient/family report  Interventions: Tube feeding, Prostat  Body mass index is 26.28 kg/m.     Subjective: Patient is very lethargic, unable to carry on a full conversation.  Overall appears to be very weak.  I met with patient's both son and wife at bedside.  Had extensive discussion regarding goals of care, his critical illness and poor prognosis.  All the questions answered.  Wife wants to transition patient to DNR/DNI and will continue ongoing conversations with the family in terms of eventually transitioning him to comfort care.  Review of Systems Otherwise negative except as per HPI, including: Unable to obtain from the patient  Examination: Constitutional: Bilateral temporal wasting, cachectic frail.  Very lethargic Respiratory: Bilateral rhonchi anteriorly Cardiovascular: Sinus tachycardia Abdomen: Nontender nondistended good bowel sounds Musculoskeletal: No edema noted Skin: No rashes seen Neurologic: Unable to assess but grossly moving all of his extremities Psychiatric: Unable to assess  PICC line in place; LUE Drain in place Foley catheter in place Cortrak In place.   Objective: Vitals:   11/28/21 1000 11/28/21 1100 11/28/21 1141 11/28/21 1200  BP: 99/62 137/71  (!) 141/61  Pulse: (!) 118 (!) 125  (!) 118  Resp: (!) 25 (!) 21  (!) 30  Temp:   98.2 F (36.8 C)   TempSrc:   Axillary   SpO2: 90% 97%  96%  Weight:      Height:        Intake/Output Summary (Last 24 hours) at 11/28/2021 1217 Last data filed at 11/28/2021 [phone]  Gross per 24 hour  Intake 2197.08 ml  Output 3160 ml  Net -962.92 ml   Filed Weights   11/25/21 0433 11/26/21 0600 11/28/21 0500  Weight: 79.7 kg 76 kg 76.1 kg     Data Reviewed:   CBC: Recent Labs  Lab 11/22/21 0500  11/23/21 0400 11/24/21 0549 11/25/21 0427 11/26/21 0538 11/27/21 0953 11/28/21 0500  WBC 9.2 8.3 10.2 9.2 9.7 11.0* 14.8*  NEUTROABS 7.3 6.3 7.8* 6.4  --   --   --   HGB 9.7* 9.0* 9.3* 9.9* 10.3* 10.1* 10.2*  HCT 29.7* 27.7* 29.4* 32.0* 33.3* 34.1* 34.6*  MCV 90.8 91.1 92.2 93.8 95.1 97.4 96.9  PLT 308 315 375 390 355 408* 035   Basic Metabolic Panel: Recent Labs  Lab 11/24/21 1606 11/25/21 0427 11/25/21 1700 11/26/21 0500 11/26/21 0538 11/26/21 1607 11/26/21 2338 11/27/21 0953 11/27/21 1715 11/28/21 0646  NA 149* 151* 154*  --    < > 158* 159* 158* 158* 146*  K 3.1* 2.6* 3.6  --    < > 3.8 3.3* 3.1* 3.3* 3.0*  CL 117* 116* 124*  --    < > 129* 126* 123* 123* 112*  CO2 $Re'22 26 27  'Nal$ --    < > $R'27 29 29 30 28  'eO$ GLUCOSE 107* 102* 157*  --    < > 135* 147* 140* 132* 413*  BUN $Re'10 12 12  'jRK$ --    < > $R'16 17 16 19 22  'Ai$ CREATININE 0.82 0.83 0.76  --    < > 0.83 0.87 0.87 0.89 0.97  CALCIUM 8.6* 8.6* 8.7*  --    < > 8.9 8.7* 8.8* 8.6* 7.9*  MG 1.8 1.9 1.9 2.0  --   --   --  2.2  --   --   PHOS 2.2* 2.6 1.9* 2.6  --   --   --  3.5  --   --    < > = values in this interval not displayed.   GFR: Estimated Creatinine Clearance: 57.7 mL/min (by C-G formula based on SCr of 0.97 mg/dL). Liver Function Tests: Recent Labs  Lab 11/22/21 0500 11/23/21 0400 11/27/21 0953  AST $Re'26 19 30  'ueC$ ALT $R'15 12 18  'io$ ALKPHOS 83 71 77  BILITOT 1.0 1.0 0.5  PROT 5.5* 5.8* 6.9  ALBUMIN <1.5* 2.3* 2.2*   No results for input(s): LIPASE, AMYLASE in the last 168 hours.  No results for input(s): AMMONIA in the last 168 hours. Coagulation Profile: No results for input(s): INR, PROTIME in the last 168 hours.  Cardiac Enzymes: No results for input(s): CKTOTAL, CKMB, CKMBINDEX, TROPONINI in the last 168 hours. BNP (last 3 results) No results for input(s): PROBNP in the last 8760 hours. HbA1C: No results for input(s): HGBA1C in the last 72 hours. CBG: Recent Labs  Lab 11/27/21 2352 11/28/21 0325  11/28/21 0751 11/28/21 0903 11/28/21 1140  GLUCAP 132* 97 129* 149* 95   Lipid Profile: Recent Labs    11/27/21 0905  TRIG 136    Thyroid Function Tests: No results for input(s): TSH, T4TOTAL, FREET4, T3FREE, THYROIDAB in the last 72 hours.  Anemia Panel: No results for input(s): VITAMINB12, FOLATE, FERRITIN, TIBC, IRON, RETICCTPCT in the last 72 hours. Sepsis Labs: Recent Labs  Lab 11/26/21 0500  PROCALCITON <0.10     Recent Results (from the past 240 hour(s))  Urine Culture     Status: Abnormal   Collection Time: 11/18/21  3:03 PM   Specimen: Urine,  Clean Catch  Result Value Ref Range Status   Specimen Description   Final    URINE, CLEAN CATCH Performed at [facility], [place] [address], [place], [place] [address]    Special Requests   Final    NONE Performed at [facility], [place] [address], [place], [place] [address]    Culture (A)  Final    <10,000 COLONIES/mL INSIGNIFICANT GROWTH Performed at [facility] [address], [place], [place] [address]    Report Status 11/20/2021 FINAL  Final  Culture, blood (routine x 2)     Status: Abnormal   Collection Time: 11/18/21  7:27 PM   Specimen: BLOOD  Result Value Ref Range Status   Specimen Description   Final    BLOOD BLOOD LEFT WRIST Performed at [facility] [address] [place], [place] [address]    Special Requests   Final    BOTTLES DRAWN AEROBIC ONLY Blood Culture adequate volume Performed at [facility] [address], [place], [place] [address]    Culture  Setup Time   Final    YEAST AEROBIC BOTTLE ONLY CRITICAL VALUE NOTED.  VALUE IS CONSISTENT WITH PREVIOUSLY REPORTED AND CALLED VALUE.    Culture (A)  Final    CANDIDA ALBICANS SUSCEPTIBILITIES PERFORMED ON PREVIOUS CULTURE WITHIN THE LAST 5 DAYS. Performed at [facility], [place] [address], [place], [place] [address]    Report Status 11/25/2021 FINAL   Final  Fungus culture, blood     Status: Abnormal   Collection Time: 11/18/21  7:28 PM   Specimen: BLOOD  Result Value Ref Range Status   Specimen Description   Final    BLOOD BLOOD LEFT HAND Performed at [facility] [address], [place], [place] [address]    Special Requests   Final    BOTTLES DRAWN AEROBIC ONLY Blood Culture adequate volume Performed at [facility] [address] [place], [place] [address]    Fungal Smear   Final    YEAST AEROBIC BOTTLE ONLY CRITICAL VALUE NOTED.  VALUE IS CONSISTENT WITH PREVIOUSLY REPORTED AND CALLED VALUE.    Culture (A)  Final    CANDIDA ALBICANS SUSCEPTIBILITIES PERFORMED ON PREVIOUS CULTURE WITHIN THE LAST 5 DAYS. Performed at [facility], [place] [address], [place], [place] [address]    Report Status 11/26/2021 FINAL  Final  Culture, blood (routine x 2)     Status: None   Collection Time: 11/18/21  7:28 PM   Specimen: BLOOD  Result Value Ref Range Status   Specimen Description   Final    BLOOD BLOOD LEFT HAND Performed at [facility] [address] [place], [place] [address]    Special Requests   Final    BOTTLES DRAWN AEROBIC ONLY Blood Culture adequate volume Performed at [facility] [address], [place], [place] [address]    Culture   Final    NO GROWTH 5 DAYS Performed at [facility], [place] [address], [place], [place] [address]    Report Status 11/24/2021 FINAL  Final  Culture, blood (Routine X 2) w Reflex to ID Panel     Status: None (Preliminary result)   Collection Time: 11/24/21  4:06 PM   Specimen: BLOOD  Result Value Ref Range Status   Specimen Description   Final    BLOOD BLOOD RIGHT HAND Performed at [facility],  [place] [address], [place], [place] [address]    Special Requests   Final    BOTTLES DRAWN AEROBIC ONLY Blood Culture results may not be optimal due to an inadequate volume of blood  received in culture bottles Performed at [facility] [place] [address], [place], [place] [address]    Culture   Final    NO GROWTH 4 DAYS Performed at [facility], [place] [address], [place], [place] [address]    Report Status PENDING  Incomplete  Culture, blood (Routine X 2) w Reflex to ID Panel     Status: None (Preliminary result)   Collection Time: 11/24/21  4:06 PM   Specimen: BLOOD  Result Value Ref Range Status   Specimen Description   Final    BLOOD BLOOD RIGHT WRIST Performed at [facility] [address], [place], [place] [address]    Special Requests   Final    BOTTLES DRAWN AEROBIC ONLY Blood Culture adequate volume Performed at [facility] [address] [place], [place] [address]    Culture   Final    NO GROWTH 4 DAYS Performed at [facility], [place] [address], [place], [place] [address]    Report Status PENDING  Incomplete         Radiology Studies: No results found.      Scheduled Meds:  chlorhexidine  15 mL Mouth Rinse BID   Chlorhexidine Gluconate Cloth  6 each Topical Daily   enoxaparin (LOVENOX) injection  80 mg Subcutaneous Q12H   feeding supplement (PROSource TF)  45 mL Per Tube BID   fluticasone  2 spray Each Nare Daily   free water  50 mL Per Tube Q4H   insulin aspart  0-9 Units Subcutaneous Q4H   lip balm  1 application Topical BID   mouth rinse  15 mL Mouth Rinse q12n4p   metoprolol tartrate  37.5 mg Per Tube BID   multivitamin  15 mL Per Tube Daily   nystatin  5 mL Oral QID   nystatin   Topical BID   QUEtiapine  12.5 mg Per Tube QHS   sodium chloride flush  10-40 mL Intracatheter Q12H   sodium chloride flush  10-40 mL Intracatheter Q12H   sodium chloride flush  5 mL Intracatheter Q8H   thiamine injection  100 mg Intravenous Daily   traZODone  50 mg Per Tube QHS   umeclidinium bromide  1 puff Inhalation Daily   Continuous Infusions:  sodium chloride Stopped  (11/23/21 1558)   sodium chloride Stopped (11/21/21 1838)   anidulafungin Stopped (11/28/21 1201)   chlorproMAZINE (THORAZINE) IV Stopped (11/22/21 0414)   feeding supplement (OSMOLITE 1.5 CAL) 55 mL/hr at 11/28/21 0300   methocarbamol (ROBAXIN) IV Stopped (11/28/21 1004)   ondansetron (ZOFRAN) IV       LOS: 19 days   Time spent= 35 mins    Ryver Poblete Arsenio Loader, MD [facility]  If 7PM-7AM, please contact night-coverage  11/28/2021, 12:17 PM

## 2021-11-28 NOTE — Progress Notes (Signed)
OT Cancellation Note  Patient Details Name: William [name] MRN: [id number] DOB: [birth year]   Cancelled Treatment:    Reason Eval/Treat Not Completed: Other (comment) (Palliative following and  heading towards comfort care.) Will sign off. Please reconsult if status changes.   [place], OTR/L Acute Rehab Pager: [phone] Office: [phone] 11/28/2021, 1:16 PM

## 2021-11-28 NOTE — Progress Notes (Addendum)
Patients wife wishes to request family meeting with doctor this morning to discuss plan of care and overall prognosis. At this time wife is reaching out to family members to arrange a meeting time.   Wife requests a meeting tomorrow 11/30 around 8/830 to have a family meeting and discuss goals or care and prognosis.

## 2021-11-28 NOTE — Progress Notes (Signed)
Daily Progress Note   Patient Name: William [name]       Date: 11/28/2021 DOB: [birth year]  Age: 80 y.o. MRN#: [id number] Attending Physician: Damita Lack, MD Primary Care Physician: [name], MD Admit Date: 11/25/2021  Reason for Consultation/Follow-up: Establishing goals of care  Subjective: Chart reviewed and discussed case in detail with Dr. Reesa Chew as well as Dr. Graylon Good from infectious disease.  I saw and examined [name] today.  He was lying in bed in no distress but is very lethargic and cannot participate in conversation.  He does not answer questions appropriately.  I called and was able to reach his wife and son on the phone.  We discussed his clinical course and continued concern about his decline despite best medical interventions.  We reviewed the continued worsening of his nutrition, cognition, and functional status and concern that this is an irreversible process regardless of medical interventions moving forward.  Discussed plan of care and they requested a meeting tomorrow to further discuss goals of care in person.  Length of Stay: 19  Current Medications: Scheduled Meds:  . chlorhexidine  15 mL Mouth Rinse BID  . Chlorhexidine Gluconate Cloth  6 each Topical Daily  . enoxaparin (LOVENOX) injection  80 mg Subcutaneous Q12H  . feeding supplement (PROSource TF)  45 mL Per Tube BID  . fluticasone  2 spray Each Nare Daily  . free water  50 mL Per Tube Q4H  . furosemide  20 mg Intravenous Once  . insulin aspart  0-9 Units Subcutaneous Q4H  . lip balm  1 application Topical BID  . mouth rinse  15 mL Mouth Rinse q12n4p  . metoprolol tartrate  37.5 mg Per Tube BID  . multivitamin  15 mL Per Tube Daily  . nystatin  5 mL Oral QID  . nystatin   Topical  BID  . potassium chloride  40 mEq Per Tube Q4H  . QUEtiapine  12.5 mg Per Tube QHS  . sodium chloride flush  10-40 mL Intracatheter Q12H  . sodium chloride flush  10-40 mL Intracatheter Q12H  . sodium chloride flush  5 mL Intracatheter Q8H  . thiamine injection  100 mg Intravenous Daily  . traZODone  50 mg Per Tube QHS  . umeclidinium bromide  1 puff Inhalation Daily  Continuous Infusions: . sodium chloride Stopped (11/23/21 1558)  . sodium chloride Stopped (11/21/21 1838)  . anidulafungin Stopped (11/28/21 1201)  . chlorproMAZINE (THORAZINE) IV Stopped (11/22/21 0414)  . feeding supplement (OSMOLITE 1.5 CAL) 55 mL/hr at 11/28/21 0300  . methocarbamol (ROBAXIN) IV Stopped (11/28/21 1004)  . ondansetron (ZOFRAN) IV      PRN Meds: sodium chloride, acetaminophen, ALPRAZolam, chlorproMAZINE (THORAZINE) IV, diphenhydrAMINE, docusate **AND** sennosides, guaiFENesin, hydrALAZINE, HYDROmorphone (DILAUDID) injection, magic mouthwash, menthol-cetylpyridinium, methocarbamol (ROBAXIN) IV, metoprolol tartrate, ondansetron (ZOFRAN) IV **OR** ondansetron (ZOFRAN) IV, phenol, sodium chloride flush, sodium chloride flush  Physical Exam        General: Lethargic.  Opens eyes and tracks, but does not really participate in conversation or answer questions.  Frail and cachectic HEENT: No bruits, no goiter, no JVD Heart: Tachycardic Lungs: Decreased air movement, bilateral rhonchi Abdomen: Soft, nontender, nondistended, positive bowel sounds.   Ext: No significant edema Skin: Warm and dry Neuro: Moves 4 extremities but does not follow commands  Vital Signs: BP (!) 146/87 (BP Location: Right Leg)   Pulse (!) 130   Temp 98.2 F (36.8 C) (Axillary)   Resp (!) 30   Ht 5\' 7"  (1.702 m)   Wt 76.1 kg   SpO2 100%   BMI 26.28 kg/m  SpO2: SpO2: 100 % O2 Device: O2 Device: High Flow Nasal Cannula, NRB O2 Flow Rate: O2 Flow Rate (L/min): 15 L/min  Intake/output summary:  Intake/Output Summary (Last  24 hours) at 11/28/2021 1514 Last data filed at 11/28/2021 1416 Gross per 24 hour  Intake 2197.08 ml  Output 4260 ml  Net -2062.92 ml   LBM: Last BM Date: 11/28/21 Baseline Weight: Weight: 79.4 kg Most recent weight: Weight: 76.1 kg       Palliative Assessment/Data:      Patient Active Problem List   Diagnosis Date Noted  . Encounter for central line placement   . Encounter for feeding tube placement   . Encounter for imaging study to confirm nasogastric (NG) tube placement   . Hypoxia   . Nasogastric tube present   . SBO (small bowel obstruction) ([place])   . Candidemia ([place])   . Shock ([place])   . Small bowel obstruction ([place])   . Left arm swelling   . Acute kidney injury ([place])   . Intra-abdominal abscess ([place])   . S/P PICC central line placement   . Fungemia   . Swelling of joint of upper arm, right   . Bacteremia due to Gram-negative bacteria 11/11/2021  . UTI due to Klebsiella species 11/11/2021  . Septic shock ([place]) 11/20/2021  . Port-A-Cath in place 04/04/2021  . Bladder cancer ([place]) 02/28/2021  . Flank pain 12/01/2020  . History of hematuria 12/01/2020  . Gross hematuria with bladder mass 09/21/2020  . Acute pain of left shoulder 09/21/2020  . Numbness 01/27/2020  . Chronic pain 01/27/2020  . Trigger finger of right thumb 01/27/2020  . Tinnitus 03/11/2019  . Dizziness 06/12/2018  . Chronic nonintractable headache 06/12/2018  . Syncope 06/01/2018  . Spider angioma 09/18/2017  . Tremor of right hand 08/23/2017  . Bilateral arm numbness and tingling while sleeping 02/08/2017  . Dyspnea on exertion 02/07/2017  . Allergic rhinitis 02/07/2017  . Right leg numbness 02/07/2017  . Enlarged lymph nodes 08/09/2016  . Health care maintenance 08/09/2016  . Plantar wart of left foot 06/28/2016  . Lateral pain of right hip 12/20/2015  . Left shoulder pain 11/29/2015  . Finger pain, left 05/23/2015  .  Plantar wart of right foot 03/15/2015  . Acute low back pain  12/27/2014  . Left wrist pain 12/13/2014  . Superficial bruising 08/04/2014  . Toenail fungus 08/04/2014  . AK (actinic keratosis) 08/04/2014  . Seborrheic dermatitis of scalp 08/04/2014  . Bilateral lower extremity edema 11/16/2013  . Screening for colon cancer 09/28/2013  . Impotence due to erectile dysfunction 08/26/2013  . Radiculopathy with lower extremity symptoms 06/03/2013  . Screening for abdominal aortic aneurysm 07/31/2012  . Asthma-COPD overlap syndrome ([place]) 04/10/2012  . KNEE PAIN, LEFT 05/16/2010  . Hyperlipidemia 04/29/2007  . Hyperplasia of prostate 02/27/2007  . Spinal stenosis of lumbar region 02/27/2007    Palliative Care Assessment & Plan   Recommendations/Plan: Agree with DNR/DNI Discussed concerns about continued decline with poor mentation, functional status, and nutrition despite maximal medical interventions.  Expressed concern that this is irreversible process.  Family expressed understanding and requested time to sit down to further discuss tomorrow. Plan for family meeting tomorrow at 10 AM per family's request to meet to discuss in person.     Code Status:    Code Status Orders  (From admission, onward)           Start     Ordered   11/28/21 1218  Do not attempt resuscitation (DNR)  Continuous       Question Answer Comment  In the event of cardiac or respiratory ARREST Do not call a "code blue"   In the event of cardiac or respiratory ARREST Do not perform Intubation, CPR, defibrillation or ACLS   In the event of cardiac or respiratory ARREST Use medication by any route, position, wound care, and other measures to relive pain and suffering. May use oxygen, suction and manual treatment of airway obstruction as needed for comfort.      11/28/21 1217           Code Status History     Date Active Date Inactive Code Status Order ID Comments User Context   11/12/2021 1505 11/28/2021 1217 Full Code [id number]  Donita Brooks, NP ED    10/13/2013 1929 10/16/2013 1855 Full Code [id number]  Kritzer, Olga Coaster, MD Inpatient       Prognosis: Guarded  Discharge Planning: To Be Determined  Care plan was discussed with family, Dr. Reesa Chew, Dr. Graylon Good  Thank you for allowing the Palliative Medicine Team to assist in the care of this patient.   Time In: 1300 Time Out: 1330 Total Time 30 Prolonged Time Billed No      Greater than 50%  of this time was spent counseling and coordinating care related to the above assessment and plan.  Micheline Rough, MD  Please contact Palliative Medicine Team phone at [phone] for questions and concerns.

## 2021-11-28 NOTE — Progress Notes (Signed)
SLP Cancellation Note  Patient Details Name: William [name] MRN: [id number] DOB: 1941/11/26   Cancelled treatment:    Pt not sufficiently alert to participate; family is in discussion with Palliative Care.  SLP services will respectfully sign off. Please re-consult if we can offer our assistance.  Thank you, William [name], [place] Office number [phone] Pager [phone]        [name] 11/28/2021, 2:42 PM

## 2021-11-29 DIAGNOSIS — Z7189 Other specified counseling: Secondary | ICD-10-CM | POA: Diagnosis not present

## 2021-11-29 DIAGNOSIS — N179 Acute kidney failure, unspecified: Secondary | ICD-10-CM | POA: Diagnosis not present

## 2021-11-29 DIAGNOSIS — R7881 Bacteremia: Secondary | ICD-10-CM | POA: Diagnosis not present

## 2021-11-29 DIAGNOSIS — B377 Candidal sepsis: Secondary | ICD-10-CM

## 2021-11-29 DIAGNOSIS — R6521 Severe sepsis with septic shock: Secondary | ICD-10-CM | POA: Diagnosis not present

## 2021-11-29 DIAGNOSIS — A419 Sepsis, unspecified organism: Secondary | ICD-10-CM | POA: Diagnosis not present

## 2021-11-29 LAB — BASIC METABOLIC PANEL
Anion gap: 6 (ref 5–15)
BUN: 37 mg/dL — ABNORMAL HIGH (ref 8–23)
CO2: 27 mmol/L (ref 22–32)
Calcium: 8.1 mg/dL — ABNORMAL LOW (ref 8.9–10.3)
Chloride: 122 mmol/L — ABNORMAL HIGH (ref 98–111)
Creatinine, Ser: 1.51 mg/dL — ABNORMAL HIGH (ref 0.61–1.24)
GFR, Estimated: 47 mL/min — ABNORMAL LOW (ref >60)
Glucose, Bld: 180 mg/dL — ABNORMAL HIGH (ref 70–99)
Potassium: 3.9 mmol/L (ref 3.5–5.1)
Sodium: 155 mmol/L — ABNORMAL HIGH (ref 135–145)

## 2021-11-29 LAB — GLUCOSE, CAPILLARY
Glucose-Capillary: 140 mg/dL — ABNORMAL HIGH (ref 70–99)
Glucose-Capillary: 173 mg/dL — ABNORMAL HIGH (ref 70–99)
Glucose-Capillary: 132 mg/dL — ABNORMAL HIGH (ref 70–99)
Glucose-Capillary: 175 mg/dL — ABNORMAL HIGH (ref 70–99)
Glucose-Capillary: 120 mg/dL — ABNORMAL HIGH (ref 70–99)

## 2021-11-29 LAB — CBC
HCT: 37.5 % — ABNORMAL LOW (ref 39.0–52.0)
Hemoglobin: 11 g/dL — ABNORMAL LOW (ref 13.0–17.0)
MCH: 28.9 pg (ref 26.0–34.0)
MCHC: 29.3 g/dL — ABNORMAL LOW (ref 30.0–36.0)
MCV: 98.4 fL (ref 80.0–100.0)
Platelets: 369 10*3/uL (ref 150–400)
RBC: 3.81 MIL/uL — ABNORMAL LOW (ref 4.22–5.81)
RDW: 15.7 % — ABNORMAL HIGH (ref 11.5–15.5)
WBC: 19.5 10*3/uL — ABNORMAL HIGH (ref 4.0–10.5)
nRBC: 0 % (ref 0.0–0.2)

## 2021-11-29 LAB — CULTURE, BLOOD (ROUTINE X 2)
Culture: NO GROWTH
Culture: NO GROWTH
Special Requests: ADEQUATE

## 2021-11-29 MED ORDER — FREE WATER
50.0000 mL | Status: DC
  Administered 2021-11-29 (×7): 50 mL

## 2021-11-29 MED ORDER — MORPHINE SULFATE (PF) 2 MG/ML IV SOLN: 1.0000 mg | Freq: Once | INTRAVENOUS | Status: DC | PRN

## 2021-11-30 NOTE — Progress Notes (Signed)
Palliative care brief progress note  I met again with patient's family including his wife and sons.  We reviewed conversation from earlier today with concern that William [name] is approaching end-of-life regardless of interventions moving forward.  His family understands situation and concern but remain hopeful that he will be healed through God's intervention.  We discussed plan to continue with care plan as outlined this morning: Continue current care, focus on continued symptom management, and reassessing his course on a daily basis.  Micheline Rough, MD [facility] [phone]

## 2021-11-30 NOTE — Plan of Care (Signed)
Patient died peacefully at 2323.   Problem: Health Behavior/Discharge Planning: Goal: Ability to manage health-related needs will improve Outcome: Completed/Met   Problem: Clinical Measurements: Goal: Ability to maintain clinical measurements within normal limits will improve Outcome: Completed/Met Goal: Will remain free from infection Outcome: Completed/Met Goal: Diagnostic test results will improve Outcome: Completed/Met Goal: Respiratory complications will improve Outcome: Completed/Met Goal: Cardiovascular complication will be avoided Outcome: Completed/Met   Problem: Activity: Goal: Risk for activity intolerance will decrease Outcome: Completed/Met   Problem: Nutrition: Goal: Adequate nutrition will be maintained Outcome: Completed/Met   Problem: Coping: Goal: Level of anxiety will decrease Outcome: Completed/Met   Problem: Pain Managment: Goal: General experience of comfort will improve Outcome: Completed/Met   Problem: Safety: Goal: Ability to remain free from injury will improve Outcome: Completed/Met   Problem: Safety: Goal: Non-violent Restraint(s) Outcome: Completed/Met   

## 2021-11-30 NOTE — Progress Notes (Signed)
   11/30/21 0000  Clinical Encounter Type  Visited With Patient and family together  Visit Type Initial;Psychological support;Spiritual support;Social support;Death  Referral From Nurse  Consult/Referral To Chaplain  Spiritual Encounters  Spiritual Needs Sacred text;Prayer;Ritual;Emotional;Grief support  Stress Factors  Patient Stress Factors None identified  Family Stress Factors Exhausted;Family relationships;Health changes;Loss;Loss of control;Major life changes   Chaplain met with patient and family at bedside this early morning - following death of patient.  Wife, Becky Sax and two adult sons were present.  Family was welcoming fo Chaplain and spiritual support.  Chaplain provided opportunity for family to express their grief and their sharing of their beloved Dao.  They each spoke of Andry's life narrative, what type of husband, and father and worker he was in the home and community.  Family requested prayer.  Chaplain prayed and anointed body and commendation of body - ending with The Lord's Prayer and 23rd Psalm.   Ended visit with a departing blessing.   Respectfully submitted,  Rev. Jarvis Newcomer. Divinity, BCCC, BCPC, CFC

## 2021-11-30 NOTE — Progress Notes (Signed)
[facility] for Infectious Disease    Date of Admission:  11/14/2021      ID: William [name] is a 80 y.o. male with candidemia with intra-abdominal infection, now with aspiration - with NRB Principal Problem:   Septic shock ([place]) Active Problems:   Chronic pain   Bladder cancer ([place])   Bacteremia due to Gram-negative bacteria   UTI due to Klebsiella species   Acute kidney injury ([place])   Intra-abdominal abscess ([place])   S/P PICC central line placement   Fungemia   Swelling of joint of upper arm, right   Shock ([place])   Small bowel obstruction ([place])   Left arm swelling   Encounter for central line placement   Encounter for feeding tube placement   Encounter for imaging study to confirm nasogastric (NG) tube placement   Hypoxia   Nasogastric tube present   SBO (small bowel obstruction) ([place])   Candidemia ([place])    Subjective: Met with family to discuss that patient is not improving  Medications:   chlorhexidine  15 mL Mouth Rinse BID   Chlorhexidine Gluconate Cloth  6 each Topical Daily   enoxaparin (LOVENOX) injection  80 mg Subcutaneous Q12H   feeding supplement (PROSource TF)  45 mL Per Tube BID   fluticasone  2 spray Each Nare Daily   free water  50 mL Per Tube Q2H   insulin aspart  0-9 Units Subcutaneous Q4H   lip balm  1 application Topical BID   mouth rinse  15 mL Mouth Rinse q12n4p   metoprolol tartrate  37.5 mg Per Tube BID   multivitamin  15 mL Per Tube Daily   nystatin  5 mL Oral QID   nystatin   Topical BID   QUEtiapine  12.5 mg Per Tube QHS   sodium chloride flush  10-40 mL Intracatheter Q12H   sodium chloride flush  10-40 mL Intracatheter Q12H   sodium chloride flush  5 mL Intracatheter Q8H   thiamine injection  100 mg Intravenous Daily   traZODone  50 mg Per Tube QHS   umeclidinium bromide  1 puff Inhalation Daily    Objective: Vital signs in last 24 hours: Temp:  [98 F (36.7 C)-103.3 F (39.6 C)] 99.8 F (37.7 C) (11/30 0800) Pulse  Rate:  [114-154] 114 (11/30 0900) Resp:  [6-40] 26 (11/30 0900) BP: (70-146)/(35-90) 94/60 (11/30 0900) SpO2:  [77 %-100 %] 93 % (11/30 0900) FiO2 (%):  [100 %] 100 % (11/30 0825) Weight:  [76 kg] 76 kg (11/30 0500) Physical Exam  Constitutional: sedated wearing NRB. He appears well-developed and well-nourished. No distress.  HENT:  Mouth/Throat: Oropharynx is clear and moist. No oropharyngeal exudate.  Cardiovascular: Normal rate, regular rhythm and normal heart sounds. Exam reveals no gallop and no friction rub.  No murmur heard.  Pulmonary/Chest: Effort normal and breath sounds normal. No respiratory distress. He has no wheezes.  Abdominal: Soft. Bowel sounds are normal. He exhibits no distension. There is no tenderness.  Lymphadenopathy:  He has no cervical adenopathy.  Neurological: He is alert and oriented to person, place, and time.  Skin: Skin is warm and dry. No rash noted. No erythema.  Psychiatric: He has a normal mood and affect. His behavior is normal.    Lab Results Recent Labs    11/28/21 0500 11/28/21 0646 12/27/21 0406  WBC 14.8*  --  19.5*  HGB 10.2*  --  11.0*  HCT 34.6*  --  37.5*  NA  --  146* 155*  K  --  3.0* 3.9  CL  --  112* 122*  CO2  --  28 27  BUN  --  22 37*  CREATININE  --  0.97 1.51*   Liver Panel Recent Labs    11/27/21 0953  PROT 6.9  ALBUMIN 2.2*  AST 30  ALT 18  ALKPHOS 77  BILITOT 0.5    Microbiology: reviewed Studies/Results: DG Chest Port 1 View  Result Date: 11/28/2021 CLINICAL DATA:  Shortness of breath. EXAM: PORTABLE CHEST 1 VIEW COMPARISON:  Chest x-ray 11/26/2021 FINDINGS: The cardiac silhouette, mediastinal and hilar contours are within normal limits and stable. Stable feeding tube coursing down the esophagus and into the stomach. Left-sided PICC line is stable. Slight improved lung aeration. Bilateral airspace process is slightly improved. No pleural effusions. IMPRESSION: Slight improved lung aeration.  Electronically Signed   By: Marijo Sanes M.D.   On: 11/28/2021 14:58     Assessment/Plan: Candidemia, intra-abdominal/peri renal abscess =  continue on anidulafungin  Respiratory distress = continue on rebreather  Overall prognosis --have met with family along with dr Domingo Cocking to address the poor prognosis.  [facility] for Infectious Diseases Pager: [phone]  2021/12/09, 10:30 AM

## 2021-11-30 NOTE — Progress Notes (Signed)
Nutrition Follow-up  DOCUMENTATION CODES:   Not applicable  INTERVENTION:  - continue Osmolite 1.5 @ 55 ml/hr with 45 ml Prosource TF BID.  - continue to monitor for [place] decisions.    NUTRITION DIAGNOSIS:   Inadequate oral intake related to decreased appetite as evidenced by per patient/family report. -ongoing, NPO status  GOAL:   Patient will meet greater than or equal to 90% of their needs -met with TF regimen  MONITOR:   TF tolerance, Labs, Weight trends, Skin  ASSESSMENT:   80 year-old male with medical history of dyslipidemia, COVID-19, bladder cancer s/p laparoscopic radical cystectomy, robotic radical prostatectomy, bilateral pelvic lymph node dissection, extensive LOAs, SBR, and ileal conduit urinary diversion on 10/26 in the setting of invasive bladder cancer, on chemo. He was seen in the Urology Clinic on 11/10 and found to have BP in the 56L systolic.  Significant Events: 11/10- admission; NGT placed in R nare (gastric) 11/11- initial RD assessment  11/17- double lumen PICC placed in R basilic; TPN initiation 89/37- diet advanced to FLD; TPN held d/t candidemia 11/21- diet advanced to Dysphagia 3, thin liquids 11/24- liquids changed from thin to nectar-thick; triple lumen PICC placed in L basilic (PICC in R basilic removed at the same time) 11/25- small bore NGT placed in L nare; TF initiation   Patient laying in bed with no visitors present at the time of RD visit. Able to talk with RN at bedside. Patient tolerating TF without issue this shift.   He is receiving Osmolite 1.5 at goal rate of 55 ml/hr with 45 ml Prosource TF BID, and 50 ml free water every 2 hours. This regimen is providing 2060 kcal, 105 grams protein, and 1606 ml free water.   Weight trending down over the past 5 days. Previously noted to have moderate pitting edema to all extremities and now noted to have non-pitting edema to BUE and mild pitting edema to BLE.   Palliative Care is following.  Patient is DNR/DNI and current plan is for no escalation of care, no de-escalation of care at this time.    Labs reviewed; CBGs: 140 and 173 mg/dl, Na: 155 mmol/l, Cl: 122 mmol/l, BUN: 37 mg/dl, creatinine: 1.51 mg/dl, Ca: 8.1 mg/dl, GFR: 47 ml/min.  Medications reviewed; 20 mg IV lasix x2 doses 11/29, sliding scale novolog, 15 ml multivitamin/day per small bore NGT, 5 ml mycostatin QID, 40 mEq Klor-Con x2 doses 11/29, 100 mg IV thiamine/day.   Diet Order:   Diet Order             Diet NPO time specified  Diet effective now                   EDUCATION NEEDS:   Education needs have been addressed  Skin:  Skin Assessment: Skin Integrity Issues: Skin Integrity Issues:: Other (Comment) Incisions: abdomen (10/26) Other: skin tear to sacrum (newly documented 11/18)  Last BM:  11/29 (type 6, medium amount)  Height:   Ht Readings from Last 1 Encounters:  11/17/21 $RemoveB'5\' 7"'[id number]$  (1.702 m)    Weight:   Wt Readings from Last 1 Encounters:  12-20-2021 76 kg    Estimated Nutritional Needs:  Kcal:  2030-2275 kcal Protein:  100-115 grams Fluid:  >/= 2.2 L/day     William Matin, MS, RD, LDN, CNSC Inpatient Clinical Dietitian RD pager # available in [place]  After hours/weekend pager # available in [facility]

## 2021-11-30 NOTE — Progress Notes (Signed)
    OVERNIGHT PROGRESS REPORT  Notified by RN that patient has deceased at 2322/03/26  Patient was followed by Palliative and was DNR.  2 RN verified.  Family was notified and are en route to this location.     Gershon Cull MSNA ACNPC-AG Acute Care Nurse Practitioner [place]

## 2021-11-30 NOTE — Progress Notes (Signed)
PROGRESS NOTE    William [name]  AXK:[id number] DOB: 1941/04/13 DOA: 11/05/2021 PCP: [name], MD    Brief Narrative:  80 year old with history of bladder cancer status post robotic assisted laparoscopic radical cystectomy/prostatectomy on 10/25/2021 by [name], bilateral pelvic lymph node dissection, extensive laparoscopic DCO lysis, small bowel resection with ileal conduit, urinary diversion on 10/26/2021, COPD, tobacco use initially discharged on 11/02/2021 but returned back to the hospital 11/02/2021 due to hypotension.  Patient was found to be in septic shock secondary to urinary tract infection from multiple species requiring vasopressors and was admitted to the ICU.  Eventually patient was transferred to ICU on 11/11/2021.  Hospital course was complicated by ileus/partial small bowel obstruction therefore general surgery was consulted.  This was conservatively managed.  Initially in renal function worsened but thereafter it slowly improved.  Repeat CT scan showed large intra-abdominal abscess 25.5 centimeters the cultures grew Candida albicans.  Patient was started on IV Diflucan, ID was Consulted and was switched to Eraxis.  IR drained intra-abdominal abscess on 11/19.  Also received PRBC transfusion during hospitalization.  Also found to have right upper extremity DVT and SVT therefore started on treatment with Lovenox.  Cortrak placed 11/25 to initiate tube feeding.  Even after several days patient continued to decline, he was eventually made DNR/DNI.  Palliative care team following.  Assessment & Plan:   Principal Problem:   Septic shock ([name]) Active Problems:   Chronic pain   Bladder cancer ([name])   Bacteremia due to Gram-negative bacteria   UTI due to Klebsiella species   Acute kidney injury ([name])   Intra-abdominal abscess ([name])   S/P PICC central line placement   Fungemia   Swelling of joint of upper arm, right   Shock ([name])   Small bowel obstruction ([name])   Left  arm swelling   Encounter for central line placement   Encounter for feeding tube placement   Encounter for imaging study to confirm nasogastric (NG) tube placement   Hypoxia   Nasogastric tube present   SBO (small bowel obstruction) ([name])   Candidemia ([name])  Septic shock, sepsis criteria is improving, no longer on vasopressor, secondary to Klebsiella oxytoca/Enterococcus faecalis UTI/Klebsiella oxytoca bacteremia, newly diagnosed Candida albicans fungemia Intra-abdominal abscess - Shock physiology resolved.  ID following.  Zosyn stopped 11/25. - Previous blood cultures 11/10-grew Klebsiella, urine cultures grew Enterococcus faecalis/Klebsiella - Blood cultures 11/18 grew Candida albicans. On IV Eraxis, can be swtiched to PO -ophthalmology [name] Close on 11/19/2021= Negative.  -Remains clinically septic with tachycardia, tachypnea, rising WBC. Overall, clinically worsening. See below. Pt is now requiring increased O2 requirements  -Appreciate in put by Palliative Care and ID   Hypernatremia, HyperChloremia - Sodium levels worsening despite D5 fluids.  -Will increase free water flushes to q2hrs   Right upper extremity DVT and SVT Left upper extremity SVT - Lovenox 1 mg/kg started.  Initially patient was on right upper extremity now in the left upper extremity.  Ideally would like to to be removed but there is no order IV access.   Sinus tachycardia/A fib rvr suspect related to sepsis and ongoing lung physiology -Due to underlying Pulm issues, Deconditioning and intravascular vol dep. IV Lopressor prn.  Patient is continued on metoprolol 37.5 mg twice daily. Replete Electrolytes. Echo (10/2021) - ef 55% g1DD.    Severe protein calorie malnutrition Hypoalbuminemia Hypernatremia due to clinical dehydration - Minimal to no oral intake..  Cortrak placed 11/25, tube feedings.  Noted to be third  spacing fluids.  Prealbumin <5 -Per above, increased free water flushes to 50 cc every 2 hours given  rising sodium   Mild acute respiratory distress with pulmonary edema Bilateral pleural effusion -Caution with IVF. ProCal and BNP repeated. CXR -bilateral patchy opacity. -Overnight events noted.  Patient now has increased O2 requirements, concerns for continued aspiration -Appreciate input by palliative care.  Recommendations at this time for neuro further escalation including O2 requirements   Intra-abdominal abscess seen on CT scan measuring 25.5 cm  -Status post image guided drain placement LLQ by IR [name] on 11/18/2021.  IR to follow. - On IV Eraxis.  Zosyn stopped 11/25.   Small bowel obstruction/SBO -Initially noted on 11/10 on CT abdomen/pelvis, NG tube placed which was removed on 11/15.  Cortak in place since 11/25   Acute blood loss anemia; stable -Baseline hemoglobin 13.  Required multiple PRBC transfusion 1U 11/19; 2U 11/22.  No obvious signs of blood loss.  Hemoglobin has since remained stable   Intermittent delirium -qhs trazadone and SeroquelDelirium protocol    Nonoliguric acute kidney injury; resolved -Baseline creatinine around 0.8.  Peaked 2.58.    Oral thrush -Already on Eraxis, Magic mouthwash with lidocaine   Refractory hypokalemia post repletion -As needed repletion of electrolytes.  He will have some refeeding syndrome.   Bladder cancer s/p radical cystectomy, prostatectomy and ileal conduit by urology [name] on 10/25/2021, and 10/26/2021. Underwent robotic assisted laparoscopic radical cystectomy, robotic radical prostatectomy, bilateral pelvic lymph node dissection, extensive laparoscopic adhesiolysis, small bowel resection and ileal conduit urinary diversion on 10/25/2021 and on 10/26/2021 by [name]. -Urology team had been following   COPD History of tobacco abuse -Continue Incruse, as needed bronchodilators, I-S/flutter.   Suspected ILD Noted on CT imaging this admission.  History of tobacco abuse. --Flonase --Outpatient pulmonology was  recommended   Weakness/deconditioning/debility: PT/OT was consulted this visit  End of Life -Progressive decline noted this visit despite aggressive measures -Appreciate input by Palliative Care and ID -Patient is nearing end of life at this time. Goals of Care is being addressed. Pt is DNR -Had met with pt's wife at bedside. Palliative Care is attempting to reach sons, however has not been successful -For now, plans for cont supportive care with no escalation noted   DVT prophylaxis: Lovenox subq Code Status: DNR Family Communication: Pt in room,pt's wife at bedside  Status is: Inpatient  Remains inpatient appropriate because: Severity of illness   Consultants:  General Surgery Urology IR ID Ophthalmology Palliative Care Critical Care  Procedures:    Antimicrobials: Anti-infectives (From admission, onward)    Start     Dose/Rate Route Frequency Ordered Stop   11/20/21 1000  anidulafungin (ERAXIS) 100 mg in sodium chloride 0.9 % 100 mL IVPB        100 mg 78 mL/hr over 100 Minutes Intravenous Every 24 hours 11/19/21 0729     11/19/21 1200  fluconazole (DIFLUCAN) IVPB 200 mg  Status:  Discontinued        200 mg 100 mL/hr over 60 Minutes Intravenous Every 24 hours 11/18/21 0852 11/19/21 0729   11/19/21 0900  anidulafungin (ERAXIS) 200 mg in sodium chloride 0.9 % 200 mL IVPB        200 mg 78 mL/hr over 200 Minutes Intravenous  Once 11/19/21 0729 11/19/21 1220   11/18/21 0945  fluconazole (DIFLUCAN) IVPB 800 mg        800 mg 200 mL/hr over 120 Minutes Intravenous  Once 11/18/21 0852 11/18/21 1204  11/17/21 1600  piperacillin-tazobactam (ZOSYN) IVPB 3.375 g  Status:  Discontinued        3.375 g 12.5 mL/hr over 240 Minutes Intravenous Every 8 hours 11/17/21 1333 11/24/21 1105   11/12/21 0800  Ampicillin-Sulbactam (UNASYN) 3 g in sodium chloride 0.9 % 100 mL IVPB  Status:  Discontinued        3 g 200 mL/hr over 30 Minutes Intravenous Every 6 hours 11/12/21 0712 11/17/21  1333   11/10/21 1400  cefTRIAXone (ROCEPHIN) 2 g in sodium chloride 0.9 % 100 mL IVPB  Status:  Discontinued        2 g 200 mL/hr over 30 Minutes Intravenous Daily 11/10/21 1048 11/12/21 0712   11/10/21 0800  ceFEPIme (MAXIPIME) 2 g in sodium chloride 0.9 % 100 mL IVPB  Status:  Discontinued        2 g 200 mL/hr over 30 Minutes Intravenous Every 12 hours 11/10/21 0618 11/10/21 1048   11/10/21 0630  ceFEPIme (MAXIPIME) 2 g in sodium chloride 0.9 % 100 mL IVPB  Status:  Discontinued        2 g 200 mL/hr over 30 Minutes Intravenous Every 24 hours 11/05/2021 1622 11/10/21 0618   11/02/2021 1624  vancomycin variable dose per unstable renal function (pharmacist dosing)  Status:  Discontinued         Does not apply See admin instructions 11/01/2021 1624 11/10/21 0613   11/14/2021 1030  ceFEPIme (MAXIPIME) 2 g in sodium chloride 0.9 % 100 mL IVPB        2 g 200 mL/hr over 30 Minutes Intravenous  Once 11/27/2021 1016 11/11/2021 1133   11/06/2021 1030  metroNIDAZOLE (FLAGYL) IVPB 500 mg        500 mg 100 mL/hr over 60 Minutes Intravenous  Once 11/10/2021 1016 11/25/2021 1915   11/27/2021 1030  vancomycin (VANCOCIN) IVPB 1000 mg/200 mL premix  Status:  Discontinued        1,000 mg 200 mL/hr over 60 Minutes Intravenous  Once 11/16/2021 1016 11/22/2021 1023   11/01/2021 1030  vancomycin (VANCOREADY) IVPB 1500 mg/300 mL        1,500 mg 150 mL/hr over 120 Minutes Intravenous  Once 11/22/2021 1023 11/18/2021 1341       Subjective: Cannot assess given mentation  Objective: Vitals:   12/23/2021 0900 2021-12-23 1000 2021-12-23 1200 12-23-2021 1300  BP: 94/60 100/62  (!) 97/49  Pulse: (!) 114 (!) 126  (!) 117  Resp: (!) 26 (!) 29  (!) 27  Temp:   (!) 101.1 F (38.4 C)   TempSrc:   Axillary   SpO2: 93% (!) 86%  (!) 88%  Weight:      Height:        Intake/Output Summary (Last 24 hours) at 23-Dec-2021 1318 Last data filed at Dec 23, 2021 1200 Gross per 24 hour  Intake 1945.13 ml  Output 1973 ml  Net -27.87 ml   Filed Weights    11/26/21 0600 11/28/21 0500 2021/12/23 0500  Weight: 76 kg 76.1 kg 76 kg    Examination: General exam: Lethargic laying in bed, in nad Respiratory system: Increased respiratory effort, no wheezing, NRB in place Cardiovascular system: regular rate, s1, s2 Gastrointestinal system: Soft, nondistended, positive BS Central nervous system: CN2-12 grossly intact, strength intact Extremities: Perfused, no clubbing Skin: Normal skin turgor, no notable skin lesions seen Psychiatry: Unable to assess given mentation  Data Reviewed: I have personally reviewed following labs and imaging studies  CBC: Recent Labs  Lab 11/23/21 0400 11/24/21  6144 11/25/21 0427 11/26/21 0538 11/27/21 0953 11/28/21 0500 12/06/21 0406  WBC 8.3 10.2 9.2 9.7 11.0* 14.8* 19.5*  NEUTROABS 6.3 7.8* 6.4  --   --   --   --   HGB 9.0* 9.3* 9.9* 10.3* 10.1* 10.2* 11.0*  HCT 27.7* 29.4* 32.0* 33.3* 34.1* 34.6* 37.5*  MCV 91.1 92.2 93.8 95.1 97.4 96.9 98.4  PLT 315 375 390 355 408* 343 315   Basic Metabolic Panel: Recent Labs  Lab 11/24/21 1606 11/25/21 0427 11/25/21 1700 11/26/21 0500 11/26/21 0538 11/26/21 2338 11/27/21 0953 11/27/21 1715 11/28/21 0646 12-06-21 0406  NA 149* 151* 154*  --    < > 159* 158* 158* 146* 155*  K 3.1* 2.6* 3.6  --    < > 3.3* 3.1* 3.3* 3.0* 3.9  CL 117* 116* 124*  --    < > 126* 123* 123* 112* 122*  CO2 $Re'22 26 27  'OyV$ --    < > $R'29 29 30 28 27  'ZC$ GLUCOSE 107* 102* 157*  --    < > 147* 140* 132* 413* 180*  BUN $Re'10 12 12  'dfc$ --    < > $R'17 16 19 22 'Sd$ 37*  CREATININE 0.82 0.83 0.76  --    < > 0.87 0.87 0.89 0.97 1.51*  CALCIUM 8.6* 8.6* 8.7*  --    < > 8.7* 8.8* 8.6* 7.9* 8.1*  MG 1.8 1.9 1.9 2.0  --   --  2.2  --   --   --   PHOS 2.2* 2.6 1.9* 2.6  --   --  3.5  --   --   --    < > = values in this interval not displayed.   GFR: Estimated Creatinine Clearance: 37.1 mL/min (A) (by C-G formula based on SCr of 1.51 mg/dL (H)). Liver Function Tests: Recent Labs  Lab 11/23/21 0400 11/27/21 0953   AST 19 30  ALT 12 18  ALKPHOS 71 77  BILITOT 1.0 0.5  PROT 5.8* 6.9  ALBUMIN 2.3* 2.2*   No results for input(s): LIPASE, AMYLASE in the last 168 hours. No results for input(s): AMMONIA in the last 168 hours. Coagulation Profile: No results for input(s): INR, PROTIME in the last 168 hours. Cardiac Enzymes: No results for input(s): CKTOTAL, CKMB, CKMBINDEX, TROPONINI in the last 168 hours. BNP (last 3 results) No results for input(s): PROBNP in the last 8760 hours. HbA1C: No results for input(s): HGBA1C in the last 72 hours. CBG: Recent Labs  Lab 11/28/21 2019 11/28/21 2313 12-06-2021 0404 2021-12-06 0808 December 06, 2021 1220  GLUCAP 143* 140* 140* 173* 132*   Lipid Profile: Recent Labs    11/27/21 0905  TRIG 136   Thyroid Function Tests: No results for input(s): TSH, T4TOTAL, FREET4, T3FREE, THYROIDAB in the last 72 hours. Anemia Panel: No results for input(s): VITAMINB12, FOLATE, FERRITIN, TIBC, IRON, RETICCTPCT in the last 72 hours. Sepsis Labs: Recent Labs  Lab 11/26/21 0500  PROCALCITON <0.10    Recent Results (from the past 240 hour(s))  Culture, blood (Routine X 2) w Reflex to ID Panel     Status: None   Collection Time: 11/24/21  4:06 PM   Specimen: BLOOD  Result Value Ref Range Status   Specimen Description   Final    BLOOD BLOOD RIGHT HAND Performed at [facility] [address], [place], [place] [address]    Special Requests   Final    BOTTLES DRAWN AEROBIC ONLY Blood Culture results may not be  optimal due to an inadequate volume of blood received in culture bottles Performed at [facility] [address] [place], [place] [address]    Culture   Final    NO GROWTH 5 DAYS Performed at [facility], [place] [address] [place], [place] [address]    Report Status 12/20/2021 FINAL  Final  Culture, blood (Routine X 2) w Reflex to ID Panel     Status: None   Collection Time: 11/24/21  4:06 PM   Specimen: BLOOD   Result Value Ref Range Status   Specimen Description   Final    BLOOD BLOOD RIGHT WRIST Performed at [facility] [address], [place], [place] [address]    Special Requests   Final    BOTTLES DRAWN AEROBIC ONLY Blood Culture adequate volume Performed at [facility] [address] [place], [place] [address]    Culture   Final    NO GROWTH 5 DAYS Performed at [facility], [place] [address], [place],  [address]    Report Status 2021-12-20 FINAL  Final     Radiology Studies: DG Chest Port 1 View  Result Date: 11/28/2021 CLINICAL DATA:  Shortness of breath. EXAM: PORTABLE CHEST 1 VIEW COMPARISON:  Chest x-ray 11/26/2021 FINDINGS: The cardiac silhouette, mediastinal and hilar contours are within normal limits and stable. Stable feeding tube coursing down the esophagus and into the stomach. Left-sided PICC line is stable. Slight improved lung aeration. Bilateral airspace process is slightly improved. No pleural effusions. IMPRESSION: Slight improved lung aeration. Electronically Signed   By: Marijo Sanes M.D.   On: 11/28/2021 14:58    Scheduled Meds:  chlorhexidine  15 mL Mouth Rinse BID   Chlorhexidine Gluconate Cloth  6 each Topical Daily   enoxaparin (LOVENOX) injection  80 mg Subcutaneous Q12H   feeding supplement (PROSource TF)  45 mL Per Tube BID   fluticasone  2 spray Each Nare Daily   free water  50 mL Per Tube Q2H   insulin aspart  0-9 Units Subcutaneous Q4H   lip balm  1 application Topical BID   mouth rinse  15 mL Mouth Rinse q12n4p   metoprolol tartrate  37.5 mg Per Tube BID   multivitamin  15 mL Per Tube Daily   nystatin  5 mL Oral QID   nystatin   Topical BID   QUEtiapine  12.5 mg Per Tube QHS   sodium chloride flush  10-40 mL Intracatheter Q12H   sodium chloride flush  10-40 mL Intracatheter Q12H   sodium chloride flush  5 mL Intracatheter Q8H   thiamine injection  100 mg Intravenous Daily   traZODone  50 mg  Per Tube QHS   umeclidinium bromide  1 puff Inhalation Daily   Continuous Infusions:  sodium chloride Stopped (11/23/21 1558)   sodium chloride 10 mL/hr at 12/20/21 1200   anidulafungin Stopped (2021/12/20 1209)   chlorproMAZINE (THORAZINE) IV Stopped (11/22/21 0414)   feeding supplement (OSMOLITE 1.5 CAL) 55 mL/hr at 20-Dec-2021 1119   methocarbamol (ROBAXIN) IV Stopped (11/28/21 1916)   ondansetron (ZOFRAN) IV       LOS: 20 days   Marylu Lund, MD [facility] Pager On Amion  If 7PM-7AM, please contact night-coverage 20-Dec-2021, 1:18 PM

## 2021-11-30 NOTE — Progress Notes (Signed)
Daily Progress Note   Patient Name: William [name]       Date: 2021-12-26 DOB: November 11, 1941  Age: 80 y.o. MRN#: [id number] Attending Physician: Donne Hazel, MD Primary Care Physician: [name], MD Admit Date: 11/13/2021  Reason for Consultation/Follow-up: Establishing goals of care  Subjective: I saw and examined William [name] this morning.  He was lying in bed with increased work of breathing.  He does not respond to verbal or tactile stimulation.  I met today with patient's wife and her close friend, Pamala Hurry, in conjunction with Dr. Baxter Flattery.  His wife reports that the doctors have been doing good job discussing with her and she understands that he has multiple comorbidities that continue to worsen.  I shared concerns that, with his multisystem decline and continued worsening, I believe that he is approaching end-of-life regardless of interventions moving forward.  She was excepting of this but was appropriately tearful when discussing the fact that her husband is dying.  We discussed difference between a aggressive medical intervention path and a palliative, comfort focused care path.  Values and goals of care important to patient and family were attempted to be elicited.  She is clear that she does not want her husband to suffer, however, she needs the opportunity to discuss plans further with his sons.  We discussed plan to continue with current care without escalation while also providing aggressive symptom management to ensure he is not suffering by adequately treating pain or shortness of breath.  She is going to go to meet up with his sons to discuss further and I will follow-up again this afternoon with her and family.   Questions and concerns addressed.   PMT will continue to  support holistically.   Length of Stay: 20  Current Medications: Scheduled Meds:  . chlorhexidine  15 mL Mouth Rinse BID  . Chlorhexidine Gluconate Cloth  6 each Topical Daily  . enoxaparin (LOVENOX) injection  80 mg Subcutaneous Q12H  . feeding supplement (PROSource TF)  45 mL Per Tube BID  . fluticasone  2 spray Each Nare Daily  . free water  50 mL Per Tube Q2H  . insulin aspart  0-9 Units Subcutaneous Q4H  . lip balm  1 application Topical BID  . mouth rinse  15 mL Mouth Rinse q12n4p  .  metoprolol tartrate  37.5 mg Per Tube BID  . multivitamin  15 mL Per Tube Daily  . nystatin  5 mL Oral QID  . nystatin   Topical BID  . QUEtiapine  12.5 mg Per Tube QHS  . sodium chloride flush  10-40 mL Intracatheter Q12H  . sodium chloride flush  10-40 mL Intracatheter Q12H  . sodium chloride flush  5 mL Intracatheter Q8H  . thiamine injection  100 mg Intravenous Daily  . traZODone  50 mg Per Tube QHS  . umeclidinium bromide  1 puff Inhalation Daily    Continuous Infusions: . sodium chloride Stopped (11/23/21 1558)  . sodium chloride 10 mL/hr at 12-24-2021 1026  . anidulafungin 100 mg (December 24, 2021 1029)  . chlorproMAZINE (THORAZINE) IV Stopped (11/22/21 0414)  . feeding supplement (OSMOLITE 1.5 CAL) 55 mL/hr at Dec 24, 2021 1119  . methocarbamol (ROBAXIN) IV Stopped (11/28/21 1916)  . ondansetron (ZOFRAN) IV      PRN Meds: sodium chloride, acetaminophen, ALPRAZolam, chlorproMAZINE (THORAZINE) IV, diphenhydrAMINE, docusate **AND** sennosides, guaiFENesin, hydrALAZINE, HYDROmorphone (DILAUDID) injection, levalbuterol, magic mouthwash, menthol-cetylpyridinium, methocarbamol (ROBAXIN) IV, metoprolol tartrate, ondansetron (ZOFRAN) IV **OR** ondansetron (ZOFRAN) IV, phenol, sodium chloride flush, sodium chloride flush  Physical Exam        General: Somnolent.  Frail and cachectic HEENT: No bruits, no goiter, no JVD Heart: Tachycardic Lungs: Decreased air movement, bilateral rhonchi Abdomen: Soft,  nontender, nondistended, positive bowel sounds.   Ext: No significant edema Skin: Warm and dry Vital Signs: BP 100/62   Pulse (!) 126   Temp 99.8 F (37.7 C) (Axillary)   Resp (!) 29   Ht '5\' 7"'  (1.702 m)   Wt 76 kg   SpO2 (!) 86%   BMI 26.24 kg/m  SpO2: SpO2: (!) 86 % O2 Device: O2 Device: High Flow Nasal Cannula, NRB O2 Flow Rate: O2 Flow Rate (L/min): 50 L/min  Intake/output summary:  Intake/Output Summary (Last 24 hours) at December 24, 2021 1137 Last data filed at 12-24-21 1000 Gross per 24 hour  Intake 1408.93 ml  Output 1923 ml  Net -514.07 ml    LBM: Last BM Date: 11/28/21 Baseline Weight: Weight: 79.4 kg Most recent weight: Weight: 76 kg       Palliative Assessment/Data:      Patient Active Problem List   Diagnosis Date Noted  . Encounter for central line placement   . Encounter for feeding tube placement   . Encounter for imaging study to confirm nasogastric (NG) tube placement   . Hypoxia   . Nasogastric tube present   . SBO (small bowel obstruction) ([place])   . Candidemia ([place])   . Shock ([place])   . Small bowel obstruction ([place])   . Left arm swelling   . Acute kidney injury ([place])   . Intra-abdominal abscess ([place])   . S/P PICC central line placement   . Fungemia   . Swelling of joint of upper arm, right   . Bacteremia due to Gram-negative bacteria 11/11/2021  . UTI due to Klebsiella species 11/11/2021  . Septic shock ([place]) 11/12/2021  . Port-A-Cath in place 04/04/2021  . Bladder cancer ([place]) 02/28/2021  . Flank pain 12/01/2020  . History of hematuria 12/01/2020  . Gross hematuria with bladder mass 09/21/2020  . Acute pain of left shoulder 09/21/2020  . Numbness 01/27/2020  . Chronic pain 01/27/2020  . Trigger finger of right thumb 01/27/2020  . Tinnitus 03/11/2019  . Dizziness 06/12/2018  . Chronic nonintractable headache 06/12/2018  . Syncope 06/01/2018  . 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 Tremor of right hand 08/23/2017  . Bilateral arm numbness  and tingling while sleeping 02/08/2017  . Dyspnea on exertion 02/07/2017  . Allergic rhinitis 02/07/2017  . Right leg numbness 02/07/2017  . Enlarged lymph nodes 08/09/2016  . Health care maintenance 08/09/2016  . Plantar wart of left foot 06/28/2016  . Lateral pain of right hip 12/20/2015  . Left shoulder pain 11/29/2015  . Finger pain, left 05/23/2015  . Plantar wart of right foot 03/15/2015  . Acute low back pain 12/27/2014  . Left wrist pain 12/13/2014  . Superficial bruising 08/04/2014  . Toenail fungus 08/04/2014  . AK (actinic keratosis) 08/04/2014  . Seborrheic dermatitis of scalp 08/04/2014  . Bilateral lower extremity edema 11/16/2013  . Screening for colon cancer 09/28/2013  . Impotence due to erectile dysfunction 08/26/2013  . Radiculopathy with lower extremity symptoms 06/03/2013  . Screening for abdominal aortic aneurysm 07/31/2012  . Asthma-COPD overlap syndrome ([place]) 04/10/2012  . KNEE PAIN, LEFT 05/16/2010  . Hyperlipidemia 04/29/2007  . Hyperplasia of prostate 02/27/2007  . Spinal stenosis of lumbar region 02/27/2007    Palliative Care Assessment & Plan   Recommendations/Plan: Agree with DNR/DNI I discussed with patient's wife that he is actively dying and likely to pass regardless of interventions moving forward.  We discussed plan to continue with current care for now without escalating his care and she is going to discuss further with their sons. While he has not been transition to full comfort care, avoiding suffering is primary goal moving forward.  Discussed with his wife and plan to provide medications as needed for pain or shortness of breath regardless of concern for unintended side effects such as hypotension and respiratory rate changes if he needs medications to ensure that he is comfortable.  Medications for pain or shortness of breath should not be held regardless of vital signs if he needs them to ensure that he is comfortable.  Code Status:     Code Status Orders  (From admission, onward)           Start     Ordered   11/28/21 1218  Do not attempt resuscitation (DNR)  Continuous       Question Answer Comment  In the event of cardiac or respiratory ARREST Do not call a "code blue"   In the event of cardiac or respiratory ARREST Do not perform Intubation, CPR, defibrillation or ACLS   In the event of cardiac or respiratory ARREST Use medication by any route, position, wound care, and other measures to relive pain and suffering. May use oxygen, suction and manual treatment of airway obstruction as needed for comfort.      11/28/21 1217           Code Status History     Date Active Date Inactive Code Status Order ID Comments User Context   11/28/2021 1505 11/28/2021 1217 Full Code [id number]  Donita Brooks, NP ED   10/13/2013 1929 10/16/2013 1855 Full Code [id number]  Kritzer, Olga Coaster, MD Inpatient       Prognosis: Likely hours to days  Discharge Planning: Anticipated Hospital Death  Care plan was discussed with family, Dr. Wyline Copas, Dr. Graylon Good  Thank you for allowing the Palliative Medicine Team to assist in the care of this patient.   Time In: 0945 Time Out: 1030 Total Time 45 Prolonged Time Billed No      Greater than 50%  of this time was spent counseling and coordinating care  related to the above assessment and plan.  Micheline Rough, MD  Please contact Palliative Medicine Team phone at [phone] for questions and concerns.

## 2021-11-30 DEATH — deceased

## 2021-12-06 ENCOUNTER — Inpatient Hospital Stay: Payer: Medicare HMO

## 2021-12-06 ENCOUNTER — Inpatient Hospital Stay: Payer: Medicare HMO | Admitting: Oncology

## 2021-12-08 LAB — CULTURE, FUNGUS WITHOUT SMEAR

## 2021-12-31 NOTE — Discharge Summary (Signed)
Death Summary  William [name]:[id number] DOB: 1941-02-07 DOA: 11/24/2021  PCP: [name], MD  Admit date: 11-24-21 Date of Death: Dec 14, 2021 Time of Death: 03-30-2322 Notification: [name], MD notified of death of 12/15/21   History of present illness:  81 year old with history of bladder cancer status post robotic assisted laparoscopic radical cystectomy/prostatectomy on 10/25/2021 by [name], bilateral pelvic lymph node dissection, extensive laparoscopic DCO lysis, small bowel resection with ileal conduit, urinary diversion on 10/26/2021, COPD, tobacco use initially discharged on 11/02/2021 but returned back to the hospital 2021/11/24 due to hypotension.  Patient was found to be in septic shock secondary to urinary tract infection from multiple species requiring vasopressors and was admitted to the ICU.  Eventually patient was transferred to ICU on 11/11/2021.  Hospital course was complicated by ileus/partial small bowel obstruction therefore general surgery was consulted.  This was conservatively managed.  Initially in renal function worsened but thereafter it slowly improved.  Repeat CT scan showed large intra-abdominal abscess 25.5 centimeters the cultures grew Candida albicans.  Patient was started on IV Diflucan, ID was Consulted and was switched to Eraxis.  IR drained intra-abdominal abscess on 11/19.  Also received PRBC transfusion during hospitalization.  Also found to have right upper extremity DVT and SVT therefore started on treatment with Lovenox.  Cortrak placed 11/25 to initiate tube feeding.  Even after several days patient continued to decline, he was eventually made DNR/DNI.  Palliative care team was consulted.  Final Diagnoses:  Septic shock, sepsis criteria is improving, no longer on vasopressor, secondary to Klebsiella oxytoca/Enterococcus faecalis UTI/Klebsiella oxytoca bacteremia, newly diagnosed Candida albicans fungemia Intra-abdominal abscess -  Shock physiology resolved.  ID had been following.  Zosyn was stopped 11/25. - Previous blood cultures 11/10-grew Klebsiella, urine cultures grew Enterococcus faecalis/Klebsiella - Blood cultures 11/18 grew Candida albicans. On IV Eraxis, can be swtiched to PO -ophthalmology [name] Close on 11/19/2021= Negative.  -Patient remained clinically septic with tachycardia, tachypnea, rising WBC. Overall, clinically worsening with increased O2 requirements despite maximal measures -Appreciate in put by Palliative Care and ID   Hypernatremia, HyperChloremia - Sodium levels worsening despite hypotonic fluids   Right upper extremity DVT and SVT Left upper extremity SVT - Lovenox 1 mg/kg started.  Initially patient was on right upper extremity later in the left upper extremity.    Sinus tachycardia/A fib rvr suspect related to sepsis and ongoing lung physiology -Due to underlying Pulm issues, Deconditioning and intravascular vol dep. IV Lopressor prn.  Patient is continued on metoprolol 37.5 mg twice daily. Echo (10/2021) - ef 55% g1DD.    Severe protein calorie malnutrition Hypoalbuminemia Hypernatremia due to clinical dehydration - Minimal to no oral intake..  Cortrak placed 11/25, tube feedings.  Noted to be third spacing fluids.  Prealbumin <5   Mild acute respiratory distress with pulmonary edema Bilateral pleural effusion -Patient was given IVF. ProCal and BNP repeated. CXR -bilateral patchy opacity. -Patient had increased O2 requirements, concerns for continued aspiration -Appreciate input by palliative care.  Recommendations were noted for no further escalation including O2 requirements   Intra-abdominal abscess seen on CT scan measuring 25.5 cm  -Status post image guided drain placement LLQ by IR [name] on 11/18/2021.   - Was on IV Eraxis.  Zosyn stopped 11/25.   Small bowel obstruction/SBO -Initially noted on 11/25/2023 on CT abdomen/pelvis, NG tube placed which was removed on 11/15.   Cortak in place since 11/25   Acute blood loss anemia; stable -Baseline hemoglobin  13.  Required multiple PRBC transfusion 1U 11/19; 2U 11/22.  No obvious signs of blood loss.  Hemoglobin had remained stable   Intermittent delirium -qhs trazadone and SeroquelDelirium protocol    Nonoliguric acute kidney injury; resolved -Baseline creatinine around 0.8.  Peaked 2.58.    Oral thrush -Already on Eraxis, Magic mouthwash with lidocaine   Refractory hypokalemia post repletion    Bladder cancer s/p radical cystectomy, prostatectomy and ileal conduit by urology [name] on 10/25/2021, and 10/26/2021. Underwent robotic assisted laparoscopic radical cystectomy, robotic radical prostatectomy, bilateral pelvic lymph node dissection, extensive laparoscopic adhesiolysis, small bowel resection and ileal conduit urinary diversion on 10/25/2021 and on 10/26/2021 by [name]. -Urology team had been following   COPD History of tobacco abuse -Continue Incruse, as needed bronchodilators, I-S/flutter.   Suspected ILD Noted on CT imaging this admission.  History of tobacco abuse. --Flonase --Outpatient pulmonology was recommended   Weakness/deconditioning/debility: PT/OT was consulted this visit   End of Life -Progressive decline noted this visit despite aggressive measures -Appreciate input by Palliative Care and ID -Patient was determined to be nearing end of life. Goals of Care was addressed. Pt later noted to be DNR -Per Palliative Care, no further escalation in care -On the evening of 12-05-21 at 2323, patient was pronounced     The results of significant diagnostics from this hospitalization (including imaging, microbiology, ancillary and laboratory) are listed below for reference.    Significant Diagnostic Studies: CT ABDOMEN PELVIS WO CONTRAST  Result Date: 11/17/2021 CLINICAL DATA:  Partial small bowel obstruction versus ileus, status post laparoscopic cystoprostatectomy and ileal  conduit urinary diversion, adhesiolysis, small-bowel resection EXAM: CT CHEST, ABDOMEN AND PELVIS WITHOUT CONTRAST CT LUMBAR SPINE WITHOUT CONTRAST TECHNIQUE: Multidetector CT imaging of the chest, abdomen and pelvis was performed following the standard protocol without IV contrast. Oral enteric contrast was administered Multidetector CT imaging of the lumbar spine was performed following the standard protocol without IV contrast. COMPARISON:  11/05/2021 FINDINGS: CT CHEST FINDINGS Cardiovascular: Right upper extremity PICC. Normal heart size. Three-vessel coronary artery calcifications. No pericardial effusion. Mediastinum/Nodes: No enlarged mediastinal, hilar, or axillary lymph nodes. Thyroid gland, trachea, and esophagus demonstrate no significant findings. Lungs/Pleura: Mild centrilobular emphysema. Probable mild, underlying pulmonary fibrosis in a bibasilar predominance pattern featuring irregular peripheral interstitial opacity, evaluation limited by the presence of pleural effusions and breath motion artifact. Small bilateral pleural effusions. Musculoskeletal: No chest wall mass or suspicious bone lesions identified. CT ABDOMEN PELVIS FINDINGS Hepatobiliary: No solid liver abnormality is seen. No gallstones, gallbladder wall thickening, or biliary dilatation. Pancreas: Unremarkable. No pancreatic ductal dilatation or surrounding inflammatory changes. Spleen: Normal in size without significant abnormality. Adrenals/Urinary Tract: Adrenal glands are unremarkable. Status post cystoprostatectomy and right lower quadrant ileal conduit urinary diversion. Mild bilateral hydronephrosis. Bilateral double-J ureteral stents in position. The left renal pelvic pigtail is incompletely formed (series 2, image 76). Stomach/Bowel: Stomach is within normal limits. Appendix appears normal. The colon and rectum is diffusely distended and air and fluid-filled, measuring up to 8.5 cm in caliber. Vascular/Lymphatic: No significant  vascular findings are present. No enlarged abdominal or pelvic lymph nodes. Reproductive: No mass or other abnormality. Other: Severe anasarca about the lower abdomen and pelvis (series 2, image 117). Subcutaneous emphysema seen on prior examination is almost entirely resolved (series 2, image 114). There is a large, elongated air and fluid collection in the left hemiabdomen about the left paracolic gutter, low midline abdomen, and pelvis, although difficult to exactly measure, at least 25 point  by 8.0 x 6.2 cm (series 2, image 103, series 5, image 55, series 2, image 115). Musculoskeletal: No acute or significant osseous findings. LUMBAR SPINE: Alignment: Normal. Disc spaces: Status post posterior discectomy and fusion of L2 through S1 with partially incorporated disc spaces. No evidence of perihardware fracture or loosening. Vertebral bodies: Intact. IMPRESSION: 1. No evidence of bowel obstruction. The colon rectum are diffusely distended and air and fluid-filled, consistent with ileus. 2. There is a large, elongated air and fluid collection in the left hemiabdomen about the left paracolic gutter, low midline abdomen, and pelvis, although difficult to exactly measure, at least 25.5 cm in greatest dimension. Findings are consistent with abscess. 3. Status post cystoprostatectomy and right lower quadrant ileal conduit urinary diversion. 4. Mild bilateral hydronephrosis. Bilateral double-J ureteral stents in position. The left renal pelvic pigtail is incompletely formed. 5. Small bilateral pleural effusions. 6. Probable mild, underlying pulmonary fibrosis in a bibasilar predominance pattern featuring irregular peripheral interstitial opacity, evaluation limited by the presence of pleural effusions and breath motion artifact. This could be further evaluated by ILD protocol CT on an outpatient basis when clinically appropriate. 7. Coronary artery disease. 8. Status post posterior discectomy and fusion of L2 through S1  with partially incorporated disc spaces. No evidence of perihardware fracture or loosening. No acute findings of the lumbar spine. These results will be called to the ordering clinician or representative by the Radiologist Assistant, and communication documented in the PACS or [employer]. Electronically Signed   By: Delanna Ahmadi M.D.   On: 11/17/2021 12:00   DG Abd 1 View  Result Date: 11/24/2021 CLINICAL DATA:  NG placement. EXAM: ABDOMEN - 1 VIEW COMPARISON:  Abdominal radiograph dated 11/24/2021. FINDINGS: Enteric tube with weighted tip to the right of the spine likely in the distal stomach or proximal duodenum. Partially visualized bilateral ureteral stents. Diffuse interstitial coarsening and streaky and nodular densities. Left-sided PICC with tip at the cavoatrial junction. IMPRESSION: Enteric tube with tip in the distal stomach or proximal duodenum. Electronically Signed   By: Anner Crete M.D.   On: 11/24/2021 19:26   DG Abd 1 View  Result Date: 11/24/2021 CLINICAL DATA:  81 year old male status post feeding tube placement. EXAM: ABDOMEN - 1 VIEW COMPARISON:  11/16/2021 FINDINGS: Weighted tip enteric feeding tube distal tip is at the level of the pylorus/first portion of the duodenum. Nonspecific bowel gas pattern. Similar appearance of indwelling bilateral nephroureteral stents. Partially visualized lumbar spinal fusion hardware. IMPRESSION: Enteric feeding tube weighted tip is located at the level of the pylorus/first portion of the duodenum. Electronically Signed   By: Ruthann Cancer M.D.   On: 11/24/2021 13:35   DG Abd 1 View  Result Date: 11/19/2021 CLINICAL DATA:  Suspected sepsis. Small-bowel obstruction seen on prior CT. EXAM: PORTABLE CHEST 1 VIEW COMPARISON:  Chest radiograph dated 11/01/2021 and CT dated 11/18/2021 FINDINGS: Right IJ central venous line with tip in the region of the cavoatrial junction. There is mild cardiomegaly with mild vascular congestion. Diffuse  chronic interstitial coarsening. No new consolidation, pleural effusion, or pneumothorax. Atherosclerotic calcification of the aorta. Enteric tube with tip in the body of the stomach. Persistent dilatation of small-bowel loops measuring up to approximately 6 cm in caliber. Bilateral ureteral stents noted. There is lumbar fusion. IMPRESSION: 1. Right IJ central venous line with tip in the region of the cavoatrial junction. No pneumothorax. 2. Enteric tube with tip in the body of the stomach. Persistent small bowel obstruction. Electronically Signed  By: Anner Crete M.D.   On: 11/07/2021 19:31   CT CHEST WO CONTRAST  Result Date: 11/17/2021 CLINICAL DATA:  Partial small bowel obstruction versus ileus, status post laparoscopic cystoprostatectomy and ileal conduit urinary diversion, adhesiolysis, small-bowel resection EXAM: CT CHEST, ABDOMEN AND PELVIS WITHOUT CONTRAST CT LUMBAR SPINE WITHOUT CONTRAST TECHNIQUE: Multidetector CT imaging of the chest, abdomen and pelvis was performed following the standard protocol without IV contrast. Oral enteric contrast was administered Multidetector CT imaging of the lumbar spine was performed following the standard protocol without IV contrast. COMPARISON:  11/22/2021 FINDINGS: CT CHEST FINDINGS Cardiovascular: Right upper extremity PICC. Normal heart size. Three-vessel coronary artery calcifications. No pericardial effusion. Mediastinum/Nodes: No enlarged mediastinal, hilar, or axillary lymph nodes. Thyroid gland, trachea, and esophagus demonstrate no significant findings. Lungs/Pleura: Mild centrilobular emphysema. Probable mild, underlying pulmonary fibrosis in a bibasilar predominance pattern featuring irregular peripheral interstitial opacity, evaluation limited by the presence of pleural effusions and breath motion artifact. Small bilateral pleural effusions. Musculoskeletal: No chest wall mass or suspicious bone lesions identified. CT ABDOMEN PELVIS FINDINGS  Hepatobiliary: No solid liver abnormality is seen. No gallstones, gallbladder wall thickening, or biliary dilatation. Pancreas: Unremarkable. No pancreatic ductal dilatation or surrounding inflammatory changes. Spleen: Normal in size without significant abnormality. Adrenals/Urinary Tract: Adrenal glands are unremarkable. Status post cystoprostatectomy and right lower quadrant ileal conduit urinary diversion. Mild bilateral hydronephrosis. Bilateral double-J ureteral stents in position. The left renal pelvic pigtail is incompletely formed (series 2, image 76). Stomach/Bowel: Stomach is within normal limits. Appendix appears normal. The colon and rectum is diffusely distended and air and fluid-filled, measuring up to 8.5 cm in caliber. Vascular/Lymphatic: No significant vascular findings are present. No enlarged abdominal or pelvic lymph nodes. Reproductive: No mass or other abnormality. Other: Severe anasarca about the lower abdomen and pelvis (series 2, image 117). Subcutaneous emphysema seen on prior examination is almost entirely resolved (series 2, image 114). There is a large, elongated air and fluid collection in the left hemiabdomen about the left paracolic gutter, low midline abdomen, and pelvis, although difficult to exactly measure, at least 25 point by 8.0 x 6.2 cm (series 2, image 103, series 5, image 55, series 2, image 115). Musculoskeletal: No acute or significant osseous findings. LUMBAR SPINE: Alignment: Normal. Disc spaces: Status post posterior discectomy and fusion of L2 through S1 with partially incorporated disc spaces. No evidence of perihardware fracture or loosening. Vertebral bodies: Intact. IMPRESSION: 1. No evidence of bowel obstruction. The colon rectum are diffusely distended and air and fluid-filled, consistent with ileus. 2. There is a large, elongated air and fluid collection in the left hemiabdomen about the left paracolic gutter, low midline abdomen, and pelvis, although difficult  to exactly measure, at least 25.5 cm in greatest dimension. Findings are consistent with abscess. 3. Status post cystoprostatectomy and right lower quadrant ileal conduit urinary diversion. 4. Mild bilateral hydronephrosis. Bilateral double-J ureteral stents in position. The left renal pelvic pigtail is incompletely formed. 5. Small bilateral pleural effusions. 6. Probable mild, underlying pulmonary fibrosis in a bibasilar predominance pattern featuring irregular peripheral interstitial opacity, evaluation limited by the presence of pleural effusions and breath motion artifact. This could be further evaluated by ILD protocol CT on an outpatient basis when clinically appropriate. 7. Coronary artery disease. 8. Status post posterior discectomy and fusion of L2 through S1 with partially incorporated disc spaces. No evidence of perihardware fracture or loosening. No acute findings of the lumbar spine. These results will be called to the ordering clinician or  representative by the Radiologist Assistant, and communication documented in the PACS or [employer]. Electronically Signed   By: Delanna Ahmadi M.D.   On: 11/17/2021 12:00   CT LUMBAR SPINE WO CONTRAST  Result Date: 11/17/2021 CLINICAL DATA:  Partial small bowel obstruction versus ileus, status post laparoscopic cystoprostatectomy and ileal conduit urinary diversion, adhesiolysis, small-bowel resection EXAM: CT CHEST, ABDOMEN AND PELVIS WITHOUT CONTRAST CT LUMBAR SPINE WITHOUT CONTRAST TECHNIQUE: Multidetector CT imaging of the chest, abdomen and pelvis was performed following the standard protocol without IV contrast. Oral enteric contrast was administered Multidetector CT imaging of the lumbar spine was performed following the standard protocol without IV contrast. COMPARISON:  11/12/2021 FINDINGS: CT CHEST FINDINGS Cardiovascular: Right upper extremity PICC. Normal heart size. Three-vessel coronary artery calcifications. No pericardial effusion.  Mediastinum/Nodes: No enlarged mediastinal, hilar, or axillary lymph nodes. Thyroid gland, trachea, and esophagus demonstrate no significant findings. Lungs/Pleura: Mild centrilobular emphysema. Probable mild, underlying pulmonary fibrosis in a bibasilar predominance pattern featuring irregular peripheral interstitial opacity, evaluation limited by the presence of pleural effusions and breath motion artifact. Small bilateral pleural effusions. Musculoskeletal: No chest wall mass or suspicious bone lesions identified. CT ABDOMEN PELVIS FINDINGS Hepatobiliary: No solid liver abnormality is seen. No gallstones, gallbladder wall thickening, or biliary dilatation. Pancreas: Unremarkable. No pancreatic ductal dilatation or surrounding inflammatory changes. Spleen: Normal in size without significant abnormality. Adrenals/Urinary Tract: Adrenal glands are unremarkable. Status post cystoprostatectomy and right lower quadrant ileal conduit urinary diversion. Mild bilateral hydronephrosis. Bilateral double-J ureteral stents in position. The left renal pelvic pigtail is incompletely formed (series 2, image 76). Stomach/Bowel: Stomach is within normal limits. Appendix appears normal. The colon and rectum is diffusely distended and air and fluid-filled, measuring up to 8.5 cm in caliber. Vascular/Lymphatic: No significant vascular findings are present. No enlarged abdominal or pelvic lymph nodes. Reproductive: No mass or other abnormality. Other: Severe anasarca about the lower abdomen and pelvis (series 2, image 117). Subcutaneous emphysema seen on prior examination is almost entirely resolved (series 2, image 114). There is a large, elongated air and fluid collection in the left hemiabdomen about the left paracolic gutter, low midline abdomen, and pelvis, although difficult to exactly measure, at least 25 point by 8.0 x 6.2 cm (series 2, image 103, series 5, image 55, series 2, image 115). Musculoskeletal: No acute or  significant osseous findings. LUMBAR SPINE: Alignment: Normal. Disc spaces: Status post posterior discectomy and fusion of L2 through S1 with partially incorporated disc spaces. No evidence of perihardware fracture or loosening. Vertebral bodies: Intact. IMPRESSION: 1. No evidence of bowel obstruction. The colon rectum are diffusely distended and air and fluid-filled, consistent with ileus. 2. There is a large, elongated air and fluid collection in the left hemiabdomen about the left paracolic gutter, low midline abdomen, and pelvis, although difficult to exactly measure, at least 25.5 cm in greatest dimension. Findings are consistent with abscess. 3. Status post cystoprostatectomy and right lower quadrant ileal conduit urinary diversion. 4. Mild bilateral hydronephrosis. Bilateral double-J ureteral stents in position. The left renal pelvic pigtail is incompletely formed. 5. Small bilateral pleural effusions. 6. Probable mild, underlying pulmonary fibrosis in a bibasilar predominance pattern featuring irregular peripheral interstitial opacity, evaluation limited by the presence of pleural effusions and breath motion artifact. This could be further evaluated by ILD protocol CT on an outpatient basis when clinically appropriate. 7. Coronary artery disease. 8. Status post posterior discectomy and fusion of L2 through S1 with partially incorporated disc spaces. No evidence of perihardware fracture  or loosening. No acute findings of the lumbar spine. These results will be called to the ordering clinician or representative by the Radiologist Assistant, and communication documented in the PACS or [employer]. Electronically Signed   By: Delanna Ahmadi M.D.   On: 11/17/2021 12:00   DG Chest Port 1 View  Result Date: 11/28/2021 CLINICAL DATA:  Shortness of breath. EXAM: PORTABLE CHEST 1 VIEW COMPARISON:  Chest x-ray 11/26/2021 FINDINGS: The cardiac silhouette, mediastinal and hilar contours are within normal limits  and stable. Stable feeding tube coursing down the esophagus and into the stomach. Left-sided PICC line is stable. Slight improved lung aeration. Bilateral airspace process is slightly improved. No pleural effusions. IMPRESSION: Slight improved lung aeration. Electronically Signed   By: Marijo Sanes M.D.   On: 11/28/2021 14:58   DG Chest Port 1 View  Result Date: 11/26/2021 CLINICAL DATA:  Sepsis.  Respiratory failure. EXAM: PORTABLE CHEST 1 VIEW COMPARISON:  11/23/2021 FINDINGS: Left subclavian central venous catheter remains in appropriate position. A new feeding tube is seen entering the stomach, although distal tip is not within the field of view. Heart size is stable. Diffuse bilateral pulmonary airspace disease shows no significant change. No pneumothorax visualized. IMPRESSION: New feeding tube is seen entering the stomach. No significant change in diffuse bilateral pulmonary airspace disease. Electronically Signed   By: Marlaine Hind M.D.   On: 11/26/2021 08:24   DG Chest Port 1 View  Result Date: 11/23/2021 CLINICAL DATA:  PICC line placement. EXAM: PORTABLE CHEST 1 VIEW COMPARISON:  11/17/2021 studies FINDINGS: A LEFT-sided PICC line is noted with tip overlying the mid-LOWER SVC. A RIGHT PICC line is again identified with tip in the region of the SUPERIOR cavoatrial junction. New diffuse bilateral airspace opacities are noted. No pneumothorax or large pleural effusion identified. No acute bony abnormalities are present. IMPRESSION: 1. LEFT PICC line placement with tip overlying the mid-LOWER SVC 2. New diffuse bilateral airspace opacities, question edema or infection. Electronically Signed   By: Margarette Canada M.D.   On: 11/23/2021 11:49   DG CHEST PORT 1 VIEW  Result Date: 11/17/2021 CLINICAL DATA:  Back pain, weakness, short of breath EXAM: PORTABLE CHEST 1 VIEW COMPARISON:  11/10/2021 FINDINGS: Central line removed.  NG removed. Mild right lower lobe airspace disease unchanged Improvement in  left lower lobe airspace disease and small left effusion. Negative for pulmonary edema. IMPRESSION: Mild improvement in left lower lobe airspace disease and left effusion. No change in right lower lobe airspace disease. Electronically Signed   By: Franchot Gallo M.D.   On: 11/17/2021 10:50   DG Chest Port 1 View  Result Date: 11/10/2021 CLINICAL DATA:  Suspected abdominal sepsis EXAM: PORTABLE CHEST 1 VIEW COMPARISON:  Prior chest x-ray 10/31/2021 FINDINGS: Right IJ central venous catheter. Catheter tip overlies the right atrium. Gastric tube is present. The tip lies within the stomach. Stable cardiac and mediastinal contours. Atherosclerotic calcifications present in the transverse aorta. Inspiratory volumes are lower. Increasing perihilar streaky airspace opacities. Pulmonary vascular congestion bordering on mild edema. Small bilateral layering pleural effusions. No pneumothorax. No acute osseous abnormality. IMPRESSION: 1. Lower inspiratory volumes with increasing perihilar and bibasilar atelectasis. 2. Pulmonary vascular congestion with borderline interstitial edema. 3. Small bilateral layering pleural effusions. 4. Stable and satisfactory support apparatus. Electronically Signed   By: Jacqulynn Cadet M.D.   On: 11/10/2021 06:25   DG CHEST PORT 1 VIEW  Result Date: 11/01/2021 CLINICAL DATA:  Suspected sepsis. Small-bowel obstruction seen on prior CT.  EXAM: PORTABLE CHEST 1 VIEW COMPARISON:  Chest radiograph dated 11/12/2021 and CT dated 11/14/2021 FINDINGS: Right IJ central venous line with tip in the region of the cavoatrial junction. There is mild cardiomegaly with mild vascular congestion. Diffuse chronic interstitial coarsening. No new consolidation, pleural effusion, or pneumothorax. Atherosclerotic calcification of the aorta. Enteric tube with tip in the body of the stomach. Persistent dilatation of small-bowel loops measuring up to approximately 6 cm in caliber. Bilateral ureteral stents  noted. There is lumbar fusion. IMPRESSION: 1. Right IJ central venous line with tip in the region of the cavoatrial junction. No pneumothorax. 2. Enteric tube with tip in the body of the stomach. Persistent small bowel obstruction. Electronically Signed   By: Anner Crete M.D.   On: 11/27/2021 19:31   DG Chest Port 1 View  Result Date: 11/07/2021 CLINICAL DATA:  Questionable sepsis EXAM: PORTABLE CHEST 1 VIEW COMPARISON:  10/24/2021 FINDINGS: Stable interstitial coarsening with interstitial lung disease by June 2022 CT. There is no edema, consolidation, effusion, or pneumothorax. Normal heart size and mediastinal contours. IMPRESSION: Interstitial lung disease.  No acute superimposed finding. Electronically Signed   By: Jorje Guild M.D.   On: 11/12/2021 10:44   DG Abd Portable 1V-Small Bowel Obstruction Protocol-initial, 8 hr delay  Result Date: 11/16/2021 CLINICAL DATA:  Small bowel protocol, 8 hour postcontrast film. EXAM: PORTABLE ABDOMEN - 1 VIEW COMPARISON:  Abdominal x-ray 11/16/2021, 9:36 a.m. FINDINGS: No significant oral contrast identified. Diffusely air-filled dilated large and small bowel appears similar to the prior study. Ureteral stents are unchanged in position. Catheters overlie the right abdomen. Lumbar fusion hardware is unchanged. There are no suspicious calcifications. No acute fractures are identified. IMPRESSION: 1. No oral contrast visualized. Stable diffuse small and large bowel dilatation. Electronically Signed   By: Ronney Asters M.D.   On: 11/16/2021 23:05   DG Abd Portable 1V  Result Date: 11/16/2021 CLINICAL DATA:  Small-bowel obstruction EXAM: PORTABLE ABDOMEN - 1 VIEW COMPARISON:  KUB 11/15/2021 FINDINGS: There is significant gaseous distention of the small and large bowel, grossly similar to the study dated 1 day prior. There is no definite free intraperitoneal air, though this is suboptimally evaluated given supine technique. Bilateral ureteral stents are  again seen which course over the right hemiabdomen, unchanged. Lumbar spine fusion hardware is again seen. IMPRESSION: Overall no significant interval change in degree of gaseous distention of the small and large bowel compared to the study from 1 day prior. Electronically Signed   By: Valetta Mole M.D.   On: 11/16/2021 10:35   DG Abd Portable 1V  Result Date: 11/15/2021 CLINICAL DATA:  Abdominal pain, vomiting EXAM: PORTABLE ABDOMEN - 1 VIEW COMPARISON:  11/12/2021 FINDINGS: There is interval removal of enteric tube. There is dilation of small-bowel loops measuring up to 5.8 cm in maximum diameter. There is moderate gaseous distention of colon. Stomach is not distended. Ureteral stents are noted on both sides apparently placed through neobladder reconstruction in the right lower abdomen. Laminectomy and fusion is seen in the lumbar spine. IMPRESSION: There is abnormal dilation of small-bowel loops with interval worsening suggesting ileus or partial obstruction. Electronically Signed   By: Elmer Picker M.D.   On: 11/15/2021 10:28   DG Abd Portable 1V  Result Date: 11/12/2021 CLINICAL DATA:  NG tube placement EXAM: PORTABLE ABDOMEN - 1 VIEW COMPARISON:  11/12/2021 at 0744 hours FINDINGS: Enteric tube terminates in the proximal gastric fundus. Bilateral ureteral stents. Lumbar spine fixation hardware. IMPRESSION: Enteric tube terminates  in the proximal gastric fundus. Electronically Signed   By: Julian Hy M.D.   On: 11/12/2021 19:15   DG Abd Portable 1V  Result Date: 11/12/2021 CLINICAL DATA:  Small bowel obstruction EXAM: PORTABLE ABDOMEN - 1 VIEW COMPARISON:  November tenth 2022 FINDINGS: There is gaseous distension of loops of bowel throughout the abdomen. Air is seen in the rectum. There is persistent gaseous dilation of several loops of small bowel in the LEFT mid abdomen, similar in comparison to prior. Bilateral nephroureteral stents. Enteric tube tip and side port project over the  stomach. Bibasilar heterogeneous opacities with a likely small LEFT pleural effusion. Status post posterior fixation of the lumbar spine. IMPRESSION: 1. Persistent gaseous dilation of several loops of small bowel in the LEFT mid abdomen. There is gaseous distension of loops of large bowel. Constellation of findings may reflect partial small bowel obstruction versus ileus. Electronically Signed   By: Valentino Saxon M.D.   On: 11/12/2021 09:56   DG Abd Portable 1V  Result Date: 11/02/2021 CLINICAL DATA:  Encounter for nasogastric tube EXAM: PORTABLE ABDOMEN - 1 VIEW COMPARISON:  Same day CT FINDINGS: Nasogastric tube courses down the left mainstem/lower lobe bronchus. Multiple dilated loops of small bowel in the abdomen partially visualized. No acute osseous abnormality. Partially visualized lumbar spine fusion hardware. Partially visualized nephroureteral stents. IMPRESSION: Nasogastric tube is malpositioned within the left mainstem/lower lobe bronchus. Recommend removal. Small bowel obstruction. Critical Value/emergent results were called by telephone at the time of interpretation on 11/27/2021 at 3:29 pm to provider [facility] , who verbally acknowledged these results. Electronically Signed   By: Maurine Simmering M.D.   On: 11/15/2021 15:30   ECHOCARDIOGRAM COMPLETE  Result Date: 11/10/2021    ECHOCARDIOGRAM REPORT   Patient Name:   William [name] Date of Exam: 11/10/2021 Medical Rec #:  [id number]      Height:       67.0 in Accession #:    [id number]     Weight:       179.2 lb Date of Birth:  09/11/1941      BSA:          1.930 m Patient Age:    68 years       BP:           108/63 mmHg Patient Gender: M              HR:           111 bpm. Exam Location:  Inpatient Procedure: 3D Echo, 2D Echo, Cardiac Doppler and Color Doppler Indications:    R94.31 Abnormal EKG  History:        Patient has prior history of Echocardiogram examinations, most                 recent 02/14/2017. Abnormal ECG, COPD,  Arrythmias:Tachycardia;                 Signs/Symptoms:Shortness of Breath, Dyspnea, Syncope,                 Dizziness/Lightheadedness and Hypotension. Septic shock. Cancer.  Sonographer:    Roseanna Rainbow RDCS Referring Phys: [id number] Donita Brooks  Sonographer Comments: Technically difficult study due to poor echo windows. IMPRESSIONS  1. Left ventricular ejection fraction, by estimation, is 55 to 60%. The left ventricle has normal function. The left ventricle has no regional wall motion abnormalities. There is mild left ventricular hypertrophy. Left ventricular diastolic parameters are consistent with Grade I  diastolic dysfunction (impaired relaxation).  2. Right ventricular systolic function is normal. The right ventricular size is normal. There is normal pulmonary artery systolic pressure.  3. The mitral valve is normal in structure. No evidence of mitral valve regurgitation.  4. The aortic valve is normal in structure. Aortic valve regurgitation is not visualized.  5. Aortic small ascending aortic aneurysm 3.8 cm.  6. The inferior vena cava is normal in size with greater than 50% respiratory variability, suggesting right atrial pressure of 3 mmHg. Comparison(s): No prior Echocardiogram. FINDINGS  Left Ventricle: Left ventricular ejection fraction, by estimation, is 55 to 60%. The left ventricle has normal function. The left ventricle has no regional wall motion abnormalities. The left ventricular internal cavity size was normal in size. There is  mild left ventricular hypertrophy. Left ventricular diastolic parameters are consistent with Grade I diastolic dysfunction (impaired relaxation). Right Ventricle: The right ventricular size is normal. No increase in right ventricular wall thickness. Right ventricular systolic function is normal. There is normal pulmonary artery systolic pressure. The tricuspid regurgitant velocity is 2.30 m/s, and  with an assumed right atrial pressure of 3 mmHg, the estimated right  ventricular systolic pressure is 67.6 mmHg. Left Atrium: Left atrial size was normal in size. Right Atrium: Right atrial size was normal in size. Pericardium: There is no evidence of pericardial effusion. Mitral Valve: The mitral valve is normal in structure. No evidence of mitral valve regurgitation. Tricuspid Valve: The tricuspid valve is normal in structure. Tricuspid valve regurgitation is trivial. Aortic Valve: The aortic valve is normal in structure. Aortic valve regurgitation is not visualized. Pulmonic Valve: The pulmonic valve was not well visualized. Pulmonic valve regurgitation is not visualized. Aorta: Small ascending aortic aneurysm 3.8 cm. Venous: The inferior vena cava is normal in size with greater than 50% respiratory variability, suggesting right atrial pressure of 3 mmHg. IAS/Shunts: No atrial level shunt detected by color flow Doppler.  LEFT VENTRICLE PLAX 2D LVIDd:         4.20 cm     Diastology LVIDs:         2.90 cm     LV e' medial:    8.49 cm/s LV PW:         1.10 cm     LV E/e' medial:  7.0 LV IVS:        1.10 cm     LV e' lateral:   9.46 cm/s LVOT diam:     2.30 cm     LV E/e' lateral: 6.3 LV SV:         106 LV SV Index:   55 LVOT Area:     4.15 cm                             3D Volume EF: LV Volumes (MOD)           3D EF:        69 % LV vol d, MOD A2C: 40.1 ml LV EDV:       114 ml LV vol d, MOD A4C: 49.1 ml LV ESV:       35 ml LV vol s, MOD A2C: 15.4 ml LV SV:        79 ml LV vol s, MOD A4C: 26.2 ml LV SV MOD A2C:     24.7 ml LV SV MOD A4C:     49.1 ml LV SV MOD BP:  24.2 ml RIGHT VENTRICLE             IVC RV S prime:     18.50 cm/s  IVC diam: 1.60 cm TAPSE (M-mode): 2.5 cm LEFT ATRIUM             Index        RIGHT ATRIUM           Index LA diam:        3.20 cm 1.66 cm/m   RA Area:     14.30 cm LA Vol (A2C):   22.4 ml 11.61 ml/m  RA Volume:   35.70 ml  18.50 ml/m LA Vol (A4C):   46.7 ml 24.19 ml/m LA Biplane Vol: 33.6 ml 17.41 ml/m  AORTIC VALVE              PULMONIC VALVE  LVOT Vmax:   164.00 cm/s  PR End Diast Vel: 1.54 msec LVOT Vmean:  113.000 cm/s LVOT VTI:    0.256 m  AORTA Ao Root diam: 3.70 cm Ao Asc diam:  3.80 cm MITRAL VALVE               TRICUSPID VALVE MV Area (PHT): 4.49 cm    TR Peak grad:   21.2 mmHg MV Decel Time: 169 msec    TR Vmax:        230.00 cm/s MV E velocity: 59.60 cm/s MV A velocity: 74.10 cm/s  SHUNTS MV E/A ratio:  0.80        Systemic VTI:  0.26 m                            Systemic Diam: 2.30 cm Phineas Inches Electronically signed by Phineas Inches Signature Date/Time: 11/10/2021/3:22:03 PM    Final    CT IMAGE GUIDED DRAINAGE BY PERCUTANEOUS CATHETER  Result Date: 11/18/2021 INDICATION: 81 year old male with fluid collection lower abdomen referred for drainage EXAM: CT GUIDED DRAINAGE OF ABDOMINAL ABSCESS MEDICATIONS: The patient is currently admitted to the hospital and receiving intravenous antibiotics. The antibiotics were administered within an appropriate time frame prior to the initiation of the procedure. ANESTHESIA/SEDATION: 0 mg IV Versed 25 mcg IV Fentanyl Moderate Sedation Time:  0 moderate sedation not used. The patient was continuously monitored during the procedure by the interventional radiology nurse under my direct supervision. COMPLICATIONS: None TECHNIQUE: Informed written consent was obtained from the patient after a thorough discussion of the procedural risks, benefits and alternatives. All questions were addressed. Maximal Sterile Barrier Technique was utilized including caps, mask, sterile gowns, sterile gloves, sterile drape, hand hygiene and skin antiseptic. A timeout was performed prior to the initiation of the procedure. PROCEDURE: The left lower quadrant was prepped with chlorhexidine in a sterile fashion, and a sterile drape was applied covering the operative field. A sterile gown and sterile gloves were used for the procedure. Local anesthesia was provided with 1% Lidocaine. After scout CT was acquired, the patient is  prepped and draped in the usual sterile fashion. 1% lidocaine was used for local anesthesia. Using CT guidance, modified Seldinger technique was used to place a 12 [place] drain into the fluid of the left lower quadrant. Sample sent for culture. Catheter was sutured in position and attached to gravity bag. Final CT was acquired. Proximally 350 cc of fluid removed. Patient tolerated the procedure well and remained hemodynamically stable throughout. No complications were encountered and no significant blood loss. FINDINGS: Twelve [place] drain into  left lower quadrant fluid collection with near complete decompression. Approximally 3 in 50 cc of thin turbid yellow fluid IMPRESSION: Status post CT-guided drainage of left lower quadrant fluid collection. Signed, Dulcy Fanny. Dellia Nims, RPVI [facility] Radiology Electronically Signed   By: Corrie Mckusick D.O.   On: 11/18/2021 12:20   VAS [place] LOWER EXTREMITY VENOUS (DVT)  Result Date: 11/14/2021  Lower Venous DVT Study Patient Name:  SADIK PIASCIK  Date of Exam:   11/14/2021 Medical Rec #: [id number]       Accession #:    [id number] Date of Birth: 07-15-1941       Patient Gender: M Patient Age:   38 years Exam Location:  [facility] Procedure:      VAS [place] LOWER EXTREMITY VENOUS (DVT) Referring Phys: Margie Billet --------------------------------------------------------------------------------  Indications: Swelling.  Risk Factors: None identified. Limitations: Poor ultrasound/tissue interface. Comparison Study: No prior studies. Performing Technologist: Oliver Hum RVT  Examination Guidelines: A complete evaluation includes B-mode imaging, spectral Doppler, color Doppler, and power Doppler as needed of all accessible portions of each vessel. Bilateral testing is considered an integral part of a complete examination. Limited examinations for reoccurring indications may be performed as noted. The reflux portion of  the exam is performed with the patient in reverse Trendelenburg.  +---------+---------------+---------+-----------+----------+--------------+ RIGHT    CompressibilityPhasicitySpontaneityPropertiesThrombus Aging +---------+---------------+---------+-----------+----------+--------------+ CFV      Full           Yes      Yes                                 +---------+---------------+---------+-----------+----------+--------------+ SFJ      Full                                                        +---------+---------------+---------+-----------+----------+--------------+ FV Prox  Full                                                        +---------+---------------+---------+-----------+----------+--------------+ FV Mid                  Yes      Yes                                 +---------+---------------+---------+-----------+----------+--------------+ FV Distal               Yes      Yes                                 +---------+---------------+---------+-----------+----------+--------------+ PFV      Full                                                        +---------+---------------+---------+-----------+----------+--------------+ POP  Full           Yes      Yes                                 +---------+---------------+---------+-----------+----------+--------------+ PTV      Full                                                        +---------+---------------+---------+-----------+----------+--------------+ PERO     Full                                                        +---------+---------------+---------+-----------+----------+--------------+   +---------+---------------+---------+-----------+----------+-------------------+ LEFT     CompressibilityPhasicitySpontaneityPropertiesThrombus Aging      +---------+---------------+---------+-----------+----------+-------------------+ CFV      Full           Yes      Yes                                       +---------+---------------+---------+-----------+----------+-------------------+ SFJ      Full                                                             +---------+---------------+---------+-----------+----------+-------------------+ FV Prox  Full                                                             +---------+---------------+---------+-----------+----------+-------------------+ FV Mid   Full                                                             +---------+---------------+---------+-----------+----------+-------------------+ FV Distal               Yes      Yes                                      +---------+---------------+---------+-----------+----------+-------------------+ PFV      Full                                                             +---------+---------------+---------+-----------+----------+-------------------+ POP      Full  Yes      Yes                                      +---------+---------------+---------+-----------+----------+-------------------+ PTV      Full                                                             +---------+---------------+---------+-----------+----------+-------------------+ PERO                                                  Not well visualized +---------+---------------+---------+-----------+----------+-------------------+     Summary: RIGHT: - There is no evidence of deep vein thrombosis in the lower extremity. However, portions of this examination were limited- see technologist comments above.  - No cystic structure found in the popliteal fossa.  LEFT: - There is no evidence of deep vein thrombosis in the lower extremity. However, portions of this examination were limited- see technologist comments above.  - No cystic structure found in the popliteal fossa.  *See table(s) above for measurements and observations. Electronically signed by  Deitra Mayo MD on 11/14/2021 at 1:55:31 PM.    Final    VAS [place] UPPER EXTREMITY VENOUS DUPLEX  Result Date: 11/25/2021 UPPER VENOUS STUDY  Patient Name:  BRYNE LINDON  Date of Exam:   11/24/2021 Medical Rec #: [id number]       Accession #:    [id number] Date of Birth: 1941/05/28       Patient Gender: M Patient Age:   18 years Exam Location:  [facility] Procedure:      VAS [place] UPPER EXTREMITY VENOUS DUPLEX Referring Phys: CORNELIUS VAN DAM --------------------------------------------------------------------------------  Indications: Swelling Risk Factors: PICC line placement. Comparison Study: Previous BUEV on 11/14/21 was negative for DVT. Performing Technologist: Charmayne Sheer  Examination Guidelines: A complete evaluation includes B-mode imaging, spectral Doppler, color Doppler, and power Doppler as needed of all accessible portions of each vessel. Bilateral testing is considered an integral part of a complete examination. Limited examinations for reoccurring indications may be performed as noted.  Right Findings: +----------+------------+---------+-----------+----------+-------+ RIGHT     CompressiblePhasicitySpontaneousPropertiesSummary +----------+------------+---------+-----------+----------+-------+ Subclavian    Full       Yes       Yes                      +----------+------------+---------+-----------+----------+-------+  Left Findings: +----------+------------+---------+-----------+----------+-------+ LEFT      CompressiblePhasicitySpontaneousPropertiesSummary +----------+------------+---------+-----------+----------+-------+ IJV           Full       Yes       Yes                      +----------+------------+---------+-----------+----------+-------+ Subclavian    Full       Yes       Yes                      +----------+------------+---------+-----------+----------+-------+ Axillary      Full       Yes       Yes                       +----------+------------+---------+-----------+----------+-------+  Brachial      Full       Yes       Yes                      +----------+------------+---------+-----------+----------+-------+ Radial        Full                                          +----------+------------+---------+-----------+----------+-------+ Ulnar         Full                                          +----------+------------+---------+-----------+----------+-------+ Cephalic      None       No        No                Acute  +----------+------------+---------+-----------+----------+-------+ Basilic       Full       Yes       Yes                      +----------+------------+---------+-----------+----------+-------+ Visualization of the brachial and basilic veins were limited due to PICC line placement.  Summary:  Right: No evidence of thrombosis in the subclavian.  Left: No evidence of deep vein thrombosis in the upper extremity. Findings consistent with acute superficial vein thrombosis involving the left cephalic vein.  *See table(s) above for measurements and observations.  Diagnosing physician: Orlie Pollen Electronically signed by Orlie Pollen on 11/25/2021 at 10:44:13 AM.    Final    VAS [place] UPPER EXTREMITY VENOUS DUPLEX  Result Date: 11/24/2021 UPPER VENOUS STUDY  Patient Name:  TANYON ALIPIO  Date of Exam:   11/23/2021 Medical Rec #: [id number]       Accession #:    [id number] Date of Birth: [birth year]       Patient Gender: M Patient Age:   21 years Exam Location:  [facility] Procedure:      VAS [place] UPPER EXTREMITY VENOUS DUPLEX Referring Phys: CORNELIUS VAN DAM --------------------------------------------------------------------------------  Indications: RUE swelling Risk Factors: PICC line placement. Limitations: Line and bandages. Comparison Study: Previous exam of BUE on 11/14/2021 was negative for DVT. Performing Technologist: Rogelia Rohrer RVT, RDMS  Examination Guidelines: A  complete evaluation includes B-mode imaging, spectral Doppler, color Doppler, and power Doppler as needed of all accessible portions of each vessel. Bilateral testing is considered an integral part of a complete examination. Limited examinations for reoccurring indications may be performed as noted.  Right Findings: +----------+------------+---------+-----------+----------+---------------------+ RIGHT     CompressiblePhasicitySpontaneousProperties       Summary        +----------+------------+---------+-----------+----------+---------------------+ IJV           Full       Yes       Yes                                    +----------+------------+---------+-----------+----------+---------------------+ Subclavian    Full       Yes       Yes                                    +----------+------------+---------+-----------+----------+---------------------+  Axillary      Full       Yes       Yes                                    +----------+------------+---------+-----------+----------+---------------------+ Brachial    Partial      No        No                  Acute in one of                                                            paired veins      +----------+------------+---------+-----------+----------+---------------------+ Radial        Full                                                        +----------+------------+---------+-----------+----------+---------------------+ Ulnar         Full                                                        +----------+------------+---------+-----------+----------+---------------------+ Cephalic      Full                                                        +----------+------------+---------+-----------+----------+---------------------+ Basilic       None       No        No                       Acute         +----------+------------+---------+-----------+----------+---------------------+ Difficulty  imaging brachial and basilic veins due to PICC line placement.  Left Findings: +----------+------------+---------+-----------+----------+-------+ LEFT      CompressiblePhasicitySpontaneousPropertiesSummary +----------+------------+---------+-----------+----------+-------+ Subclavian    Full       Yes       Yes                      +----------+------------+---------+-----------+----------+-------+  Summary:  Right: Findings consistent with acute deep vein thrombosis involving the right brachial veins. Findings consistent with acute superficial vein thrombosis involving the right basilic vein.  Left: No evidence of thrombosis in the subclavian.  *See table(s) above for measurements and observations.  Diagnosing physician: Orlie Pollen Electronically signed by Orlie Pollen on 11/24/2021 at 10:48:00 AM.    Final    VAS [place] UPPER EXTREMITY VENOUS DUPLEX  Result Date: 11/14/2021 UPPER VENOUS STUDY  Patient Name:  SAMEER TEEPLE  Date of Exam:   11/14/2021 Medical Rec #: [id number]       Accession #:    [id number] Date of Birth: [birth year]       Patient Gender:  M Patient Age:   48 years Exam Location:  [facility] Procedure:      VAS [place] UPPER EXTREMITY VENOUS DUPLEX Referring Phys: Jerene Pitch MEUTH --------------------------------------------------------------------------------  Indications: Edema Risk Factors: None identified. Limitations: Poor ultrasound/tissue interface and line. Comparison Study: No prior studies. Performing Technologist: Oliver Hum RVT  Examination Guidelines: A complete evaluation includes B-mode imaging, spectral Doppler, color Doppler, and power Doppler as needed of all accessible portions of each vessel. Bilateral testing is considered an integral part of a complete examination. Limited examinations for reoccurring indications may be performed as noted.  Right Findings: +----------+------------+---------+-----------+----------+-------+ RIGHT      CompressiblePhasicitySpontaneousPropertiesSummary +----------+------------+---------+-----------+----------+-------+ IJV           Full       Yes       Yes                      +----------+------------+---------+-----------+----------+-------+ Subclavian    Full       Yes       Yes                      +----------+------------+---------+-----------+----------+-------+ Axillary      Full       Yes       Yes                      +----------+------------+---------+-----------+----------+-------+ Brachial      Full       Yes       Yes                      +----------+------------+---------+-----------+----------+-------+ Radial        Full                                          +----------+------------+---------+-----------+----------+-------+ Ulnar         Full                                          +----------+------------+---------+-----------+----------+-------+ Cephalic      Full                                          +----------+------------+---------+-----------+----------+-------+ Basilic       Full                                          +----------+------------+---------+-----------+----------+-------+  Left Findings: +----------+------------+---------+-----------+----------+-------+ LEFT      CompressiblePhasicitySpontaneousPropertiesSummary +----------+------------+---------+-----------+----------+-------+ IJV           Full       Yes       Yes                      +----------+------------+---------+-----------+----------+-------+ Subclavian    Full       Yes       Yes                      +----------+------------+---------+-----------+----------+-------+ Axillary      Full  Yes       Yes                      +----------+------------+---------+-----------+----------+-------+ Brachial      Full       Yes       Yes                      +----------+------------+---------+-----------+----------+-------+  Radial        Full                                          +----------+------------+---------+-----------+----------+-------+ Ulnar         Full                                          +----------+------------+---------+-----------+----------+-------+ Cephalic      Full                                          +----------+------------+---------+-----------+----------+-------+ Basilic       Full                                          +----------+------------+---------+-----------+----------+-------+  Summary:  Right: No evidence of deep vein thrombosis in the upper extremity. No evidence of superficial vein thrombosis in the upper extremity.  Left: No evidence of deep vein thrombosis in the upper extremity. No evidence of superficial vein thrombosis in the upper extremity.  *See table(s) above for measurements and observations.  Diagnosing physician: Deitra Mayo MD Electronically signed by Deitra Mayo MD on 11/14/2021 at 1:55:47 PM.    Final    [place] EKG SITE RITE  Result Date: 11/22/2021 If Site Rite image not attached, placement could not be confirmed due to current cardiac rhythm.  [place] EKG SITE RITE  Result Date: 11/16/2021 If Site Rite image not attached, placement could not be confirmed due to current cardiac rhythm.  CT CHEST ABDOMEN PELVIS WO CONTRAST  Result Date: 11/08/2021 CLINICAL DATA:  Hypotension, shock EXAM: CT CHEST, ABDOMEN AND PELVIS WITHOUT CONTRAST TECHNIQUE: Multidetector CT imaging of the chest, abdomen and pelvis was performed following the standard protocol without IV contrast. COMPARISON:  CT abdomen and pelvis 06/09/2021 FINDINGS: CT CHEST FINDINGS Cardiovascular: Heart size is normal. No pericardial effusion identified. Coronary artery calcifications noted. Main pulmonary artery is normal caliber. Note is made of a small amount of air in the brachiocephalic vein. Thoracic aorta is normal in caliber with mild calcified plaques.  Mediastinum/Nodes: No bulky axillary, mediastinal or hilar lymphadenopathy identified. Lungs/Pleura: Lungs are hyperinflated with mild emphysematous changes. There is mild interlobular septal thickening which is new since previous study. Dependent subsegmental atelectatic changes bilaterally. No focal consolidation visualized. No significant pleural effusion. No pneumothorax. Musculoskeletal: No suspicious bony lesions identified in the chest. Small foci of subcutaneous or vascular air identified in the right upper chest region as well as in the subcutaneous plane in the lower chest extending to the abdomen. CT ABDOMEN PELVIS FINDINGS Hepatobiliary: No focal liver abnormality is seen. No gallstones, gallbladder wall thickening, or biliary  dilatation. Pancreas: Unremarkable. No pancreatic ductal dilatation or surrounding inflammatory changes. Spleen: Normal in size without focal abnormality. Adrenals/Urinary Tract: Adrenal glands appear normal. Recent cystoprostatectomy surgical changes. No nephrolithiasis or hydronephrosis identified bilaterally. Bilateral ureteral stents which terminate distally through an ostomy in the right abdomen. Stomach/Bowel: Limited evaluation of bowel due to lack of contrast. There are multiple loops of distended small bowel with air-fluid levels, mostly in the left abdomen which measure up to 4.9 cm in diameter. Accompanying mesenteric engorgement/fat stranding. No pneumatosis identified. Distal loops of ileum are decompressed. Postsurgical changes in the right lower quadrant. Multiple foci of extraluminal air visualized in the right pelvis and a few in the upper abdomen. Retained fecal material in the colon and rectum. Vascular/Lymphatic: Aortic atherosclerosis. No enlarged abdominal or pelvic lymph nodes. Reproductive: Prostate gland surgically absent. Other: Subcutaneous emphysema identified throughout the anterior abdomen, pelvis and anterior aspect of the visualized proximal lower  extremities. No associated defined fluid collections. Small to moderate volume ascites mostly in the pelvis and in the left abdomen Musculoskeletal: Degenerative and postsurgical changes of the lumbar spine. No suspicious bony lesions visualized. IMPRESSION: 1. Findings concerning for small bowel obstruction, with proximal loops of small bowel measuring up to 4.9 cm in diameter with air-fluid levels. Possible transition point in the lower abdomen/pelvis near a surgical anastomosis. 2. Small to moderate volume ascites mostly located in the pelvis and left abdomen. 3. Small amount of free intraperitoneal air, mostly in the right pelvis surrounded by ascites. May be postoperative in nature, correlate clinically and follow-up recommended. 4. Small to moderate amount of subcutaneous emphysema mostly in the anterior abdominal and pelvic walls. No accompanying defined fluid collections or significant adjacent inflammatory fat stranding. 5. Cystoprostatectomy changes with right-sided urinary ostomy. Bilateral ureteral stents extending through the ostomy. No hydronephrosis. 6. Mild interstitial pulmonary edema. Emphysematous and subsegmental atelectatic changes. Discussed with Dr. Regenia Skeeter over the telephone at 1:09 p.m. on 11/19/2021 with read back. Electronically Signed   By: Ofilia Neas M.D.   On: 11/13/2021 13:26    Microbiology: Recent Results (from the past 240 hour(s))  Culture, blood (Routine X 2) w Reflex to ID Panel     Status: None   Collection Time: 11/24/21  4:06 PM   Specimen: BLOOD  Result Value Ref Range Status   Specimen Description   Final    BLOOD BLOOD RIGHT HAND Performed at [facility] [address] [place], [place] [address]    Special Requests   Final    BOTTLES DRAWN AEROBIC ONLY Blood Culture results may not be optimal due to an inadequate volume of blood received in culture bottles Performed at [facility] [address],  [place], [place] [address]    Culture   Final    NO GROWTH 5 DAYS Performed at [facility], [place] [address], [place], [place] [address]    Report Status 12-11-2021 FINAL  Final  Culture, blood (Routine X 2) w Reflex to ID Panel     Status: None   Collection Time: 11/24/21  4:06 PM   Specimen: BLOOD  Result Value Ref Range Status   Specimen Description   Final    BLOOD BLOOD RIGHT WRIST Performed at [facility] [address], [place], [place] [address]    Special Requests   Final    BOTTLES DRAWN AEROBIC ONLY Blood Culture adequate volume Performed at [facility] [address] [place],  [address]  Culture   Final    NO GROWTH 5 DAYS Performed at [facility], [place] [address], [place], [place] [address]    Report Status 12-11-21 FINAL  Final     Labs: Basic Metabolic Panel: Recent Labs  Lab 11/24/21 1606 11/25/21 0427 11/25/21 1700 11/26/21 0500 11/26/21 0538 11/26/21 2338 11/27/21 0953 11/27/21 1715 11/28/21 0646 2021/12/11 0406  NA 149* 151* 154*  --    < > 159* 158* 158* 146* 155*  K 3.1* 2.6* 3.6  --    < > 3.3* 3.1* 3.3* 3.0* 3.9  CL 117* 116* 124*  --    < > 126* 123* 123* 112* 122*  CO2 22 26 27   --    < > 29 29 30 28 27   GLUCOSE 107* 102* 157*  --    < > 147* 140* 132* 413* 180*  BUN 10 12 12   --    < > 17 16 19 22  37*  CREATININE 0.82 0.83 0.76  --    < > 0.87 0.87 0.89 0.97 1.51*  CALCIUM 8.6* 8.6* 8.7*  --    < > 8.7* 8.8* 8.6* 7.9* 8.1*  MG 1.8 1.9 1.9 2.0  --   --  2.2  --   --   --   PHOS 2.2* 2.6 1.9* 2.6  --   --  3.5  --   --   --    < > = values in this interval not displayed.   Liver Function Tests: Recent Labs  Lab 11/27/21 0953  AST 30  ALT 18  ALKPHOS 77  BILITOT 0.5  PROT 6.9  ALBUMIN 2.2*   No results for input(s): LIPASE, AMYLASE in the last 168 hours. No results for input(s): AMMONIA in the last 168 hours. CBC: Recent Labs  Lab 11/24/21 0549 11/25/21 0427  11/26/21 0538 11/27/21 0953 11/28/21 0500 12/11/2021 0406  WBC 10.2 9.2 9.7 11.0* 14.8* 19.5*  NEUTROABS 7.8* 6.4  --   --   --   --   HGB 9.3* 9.9* 10.3* 10.1* 10.2* 11.0*  HCT 29.4* 32.0* 33.3* 34.1* 34.6* 37.5*  MCV 92.2 93.8 95.1 97.4 96.9 98.4  PLT 375 390 355 408* 343 369   Cardiac Enzymes: No results for input(s): CKTOTAL, CKMB, CKMBINDEX, TROPONINI in the last 168 hours. D-Dimer No results for input(s): DDIMER in the last 72 hours. BNP: Invalid input(s): POCBNP CBG: Recent Labs  Lab 12-11-21 0404 11-Dec-2021 0808 12-11-2021 1220 2021-12-11 1554 11-Dec-2021 2003  GLUCAP 140* 173* 132* 175* 120*   Anemia work up No results for input(s): VITAMINB12, FOLATE, FERRITIN, TIBC, IRON, RETICCTPCT in the last 72 hours. Urinalysis    Component Value Date/Time   COLORURINE YELLOW 11/17/2021 1752   APPEARANCEUR HAZY (A) 11/17/2021 1752   LABSPEC 1.008 11/17/2021 1752   PHURINE 7.0 11/17/2021 1752   GLUCOSEU NEGATIVE 11/17/2021 1752   HGBUR MODERATE (A) 11/17/2021 1752   BILIRUBINUR NEGATIVE 11/17/2021 1752   BILIRUBINUR negative 09/21/2020 [place] 11/17/2021 1752   PROTEINUR NEGATIVE 11/17/2021 1752   UROBILINOGEN 1.0 09/21/2020 0923   UROBILINOGEN 1.0 12/24/2014 1109   NITRITE NEGATIVE 11/17/2021 1752   LEUKOCYTESUR LARGE (A) 11/17/2021 1752   Sepsis Labs Invalid input(s): PROCALCITONIN,  WBC,  LACTICIDVEN    SIGNED:  Marylu Lund, MD  [facility] 11/30/2021, 7:37 AM  If 7PM-7AM, please contact night-coverage [email or web address] Password [email or web address]

## 2023-02-18 NOTE — Telephone Encounter (Signed)
Open in error
